# Patient Record
Sex: Male | Born: 1945 | Race: White | Hispanic: No | Marital: Married | Smoking: Never smoker
Health system: Southern US, Community
[De-identification: ages and names within clinical notes are randomized; demographics above are authoritative.]

## PROBLEM LIST (undated history)

## (undated) DIAGNOSIS — Z87442 Personal history of urinary calculi: Secondary | ICD-10-CM

## (undated) DIAGNOSIS — I219 Acute myocardial infarction, unspecified: Secondary | ICD-10-CM

## (undated) DIAGNOSIS — R760 Raised antibody titer: Secondary | ICD-10-CM

## (undated) DIAGNOSIS — I2510 Atherosclerotic heart disease of native coronary artery without angina pectoris: Secondary | ICD-10-CM

## (undated) DIAGNOSIS — E785 Hyperlipidemia, unspecified: Secondary | ICD-10-CM

## (undated) DIAGNOSIS — I255 Ischemic cardiomyopathy: Secondary | ICD-10-CM

## (undated) DIAGNOSIS — N400 Enlarged prostate without lower urinary tract symptoms: Secondary | ICD-10-CM

## (undated) DIAGNOSIS — K219 Gastro-esophageal reflux disease without esophagitis: Secondary | ICD-10-CM

## (undated) DIAGNOSIS — G4730 Sleep apnea, unspecified: Secondary | ICD-10-CM

## (undated) DIAGNOSIS — H269 Unspecified cataract: Secondary | ICD-10-CM

## (undated) DIAGNOSIS — M109 Gout, unspecified: Secondary | ICD-10-CM

## (undated) DIAGNOSIS — I10 Essential (primary) hypertension: Secondary | ICD-10-CM

## (undated) DIAGNOSIS — M1990 Unspecified osteoarthritis, unspecified site: Secondary | ICD-10-CM

## (undated) DIAGNOSIS — Z955 Presence of coronary angioplasty implant and graft: Secondary | ICD-10-CM

## (undated) DIAGNOSIS — I5022 Chronic systolic (congestive) heart failure: Secondary | ICD-10-CM

## (undated) DIAGNOSIS — I82409 Acute embolism and thrombosis of unspecified deep veins of unspecified lower extremity: Secondary | ICD-10-CM

## (undated) DIAGNOSIS — N210 Calculus in bladder: Secondary | ICD-10-CM

## (undated) DIAGNOSIS — I609 Nontraumatic subarachnoid hemorrhage, unspecified: Secondary | ICD-10-CM

## (undated) HISTORY — DX: Acute embolism and thrombosis of unspecified deep veins of unspecified lower extremity: I82.409

## (undated) HISTORY — DX: Hyperlipidemia, unspecified: E78.5

## (undated) HISTORY — DX: Benign prostatic hyperplasia without lower urinary tract symptoms: N40.0

## (undated) HISTORY — PX: WRIST SURGERY: SHX841

## (undated) HISTORY — DX: Gastro-esophageal reflux disease without esophagitis: K21.9

## (undated) HISTORY — DX: Nontraumatic subarachnoid hemorrhage, unspecified: I60.9

## (undated) HISTORY — DX: Atherosclerotic heart disease of native coronary artery without angina pectoris: I25.10

## (undated) HISTORY — PX: COLONOSCOPY: SHX174

## (undated) HISTORY — DX: Chronic systolic (congestive) heart failure: I50.22

## (undated) HISTORY — DX: Gout, unspecified: M10.9

## (undated) HISTORY — DX: Raised antibody titer: R76.0

---

## 1998-03-23 DIAGNOSIS — I219 Acute myocardial infarction, unspecified: Secondary | ICD-10-CM

## 1998-03-23 HISTORY — DX: Acute myocardial infarction, unspecified: I21.9

## 1998-05-09 ENCOUNTER — Encounter: Payer: Self-pay | Admitting: Internal Medicine

## 1998-05-09 ENCOUNTER — Inpatient Hospital Stay (HOSPITAL_COMMUNITY): Admission: EM | Admit: 1998-05-09 | Discharge: 1998-05-12 | Payer: Self-pay | Admitting: Cardiology

## 1998-05-15 ENCOUNTER — Ambulatory Visit (HOSPITAL_COMMUNITY): Admission: RE | Admit: 1998-05-15 | Discharge: 1998-05-15 | Payer: Self-pay | Admitting: Cardiology

## 1998-08-08 ENCOUNTER — Ambulatory Visit (HOSPITAL_COMMUNITY): Admission: RE | Admit: 1998-08-08 | Discharge: 1998-08-08 | Payer: Self-pay | Admitting: Cardiology

## 2001-06-09 ENCOUNTER — Ambulatory Visit (HOSPITAL_COMMUNITY): Admission: RE | Admit: 2001-06-09 | Discharge: 2001-06-09 | Payer: Self-pay | Admitting: Cardiology

## 2001-06-09 ENCOUNTER — Encounter: Payer: Self-pay | Admitting: Cardiology

## 2001-07-01 ENCOUNTER — Ambulatory Visit (HOSPITAL_COMMUNITY): Admission: RE | Admit: 2001-07-01 | Discharge: 2001-07-01 | Payer: Self-pay | Admitting: Cardiovascular Disease

## 2001-07-13 ENCOUNTER — Encounter: Admission: RE | Admit: 2001-07-13 | Discharge: 2001-07-13 | Payer: Self-pay | Admitting: Critical Care Medicine

## 2001-07-13 ENCOUNTER — Encounter: Payer: Self-pay | Admitting: Critical Care Medicine

## 2001-08-30 ENCOUNTER — Ambulatory Visit: Admission: RE | Admit: 2001-08-30 | Discharge: 2001-08-30 | Payer: Self-pay | Admitting: Critical Care Medicine

## 2003-09-21 ENCOUNTER — Emergency Department (HOSPITAL_COMMUNITY): Admission: EM | Admit: 2003-09-21 | Discharge: 2003-09-21 | Payer: Self-pay | Admitting: Emergency Medicine

## 2003-09-21 ENCOUNTER — Emergency Department (HOSPITAL_COMMUNITY): Admission: EM | Admit: 2003-09-21 | Discharge: 2003-09-21 | Payer: Self-pay | Admitting: *Deleted

## 2008-09-10 ENCOUNTER — Ambulatory Visit: Payer: Self-pay | Admitting: Diagnostic Radiology

## 2008-09-10 ENCOUNTER — Emergency Department (HOSPITAL_BASED_OUTPATIENT_CLINIC_OR_DEPARTMENT_OTHER): Admission: EM | Admit: 2008-09-10 | Discharge: 2008-09-10 | Payer: Self-pay | Admitting: Emergency Medicine

## 2009-04-09 LAB — LIPID PANEL
Cholesterol: 167 mg/dL (ref 0–200)
HDL: 40 mg/dL (ref 35–70)
LDL Cholesterol: 110 mg/dL
Triglycerides: 82 mg/dL (ref 40–160)

## 2009-04-09 LAB — TSH: TSH: 1.25 u[IU]/mL (ref <=5.90)

## 2009-12-09 ENCOUNTER — Ambulatory Visit: Payer: Self-pay | Admitting: Cardiology

## 2009-12-16 ENCOUNTER — Telehealth (INDEPENDENT_AMBULATORY_CARE_PROVIDER_SITE_OTHER): Payer: Self-pay | Admitting: *Deleted

## 2009-12-17 ENCOUNTER — Encounter: Payer: Self-pay | Admitting: Cardiology

## 2009-12-17 ENCOUNTER — Ambulatory Visit: Payer: Self-pay

## 2009-12-17 ENCOUNTER — Ambulatory Visit: Payer: Self-pay | Admitting: Cardiology

## 2009-12-17 ENCOUNTER — Encounter (HOSPITAL_COMMUNITY): Admission: RE | Admit: 2009-12-17 | Discharge: 2010-02-24 | Payer: Self-pay | Admitting: Cardiology

## 2009-12-20 ENCOUNTER — Ambulatory Visit: Payer: Self-pay | Admitting: Cardiology

## 2010-04-22 NOTE — Assessment & Plan Note (Signed)
Summary: Cardiology Nuclear Testing  Nuclear Med Background Indications for Stress Test: Evaluation for Ischemia   History: Heart Catheterization, Myocardial Infarction, Myocardial Perfusion Study, Stents  History Comments: 00 MI -AWMI and stent of LAD 07 MPS-Small basal ant. area of ischemia with EF-56%  Symptoms: Chest Pain    Nuclear Pre-Procedure Cardiac Risk Factors: Lipids Caffeine/Decaff Intake: none NPO After: 10:00 PM Lungs: clear IV 0.9% NS with Angio Cath: 22g     IV Site: R Hand IV Started by: Cathlyn Parsons, RN Chest Size (in) 46     Height (in): 72 Weight (lb): 250 BMI: 34.03  Nuclear Med Study 1 or 2 day study:  1 day     Stress Test Type:  Stress Reading MD:  Willa Rough, MD     Referring MD:  P.Jordon Resting Radionuclide:  Technetium 82m Tetrofosmin     Resting Radionuclide Dose:  11 mCi  Stress Radionuclide:  Technetium 75m Tetrofosmin     Stress Radionuclide Dose:  33 mCi   Stress Protocol Exercise Time (min):  5:00 min     Max HR:  162 bpm Max Systolic BP: 132 mm Hg     METS: 7.00 Rate Pressure Product:  04540    Stress Test Technologist:  Frederick Peers, EMT-P     Nuclear Technologist:  Domenic Polite, CNMT  Rest Procedure  Myocardial perfusion imaging was performed at rest 45 minutes following the intravenous administration of Technetium 29m Tetrofosmin.  Stress Procedure  The patient exercised for 5:00 min .  The patient stopped due to SOB   and denied any chest pain.  There were non specific ST-T wave changes/occ PVC post excersice that resolved..  Technetium 68m Tetrofosmin was injected at peak exercise and myocardial perfusion imaging was performed after a brief delay.  QPS Raw Data Images:  Patient motion noted; appropriate software correction applied. Stress Images:  There is decreased activity (mild) in the anterior wall and (moderate) in the septum and apex. Rest Images:  Similar to stress Subtraction (SDS):  Mild reversibility in  the apex. Transient Ischemic Dilatation:  .98  (Normal <1.22)  Lung/Heart Ratio:  .32  (Normal <0.45)  Quantitative Gated Spect Images QGS EDV:  141 ml QGS ESV:  85 ml QGS EF:  40 % QGS cine images:  Antero-septal-apical hypokinesis  Findings Abnormal      Overall Impression  Exercise Capacity: Poor exercise capacity. BP Response: Normal blood pressure response. Clinical Symptoms: Marked SOB ECG Impression: No significant ST segment change suggestive of ischemia. Overall Impression Comments: Patient had rapid increase in heart rate with stress. There was marked SOB. The O2sat. was 96%. There were no significant ST changes. The nuclear images are c/w old antero-septal-apical MI. There is mild peri-infarct ischemia.

## 2010-04-22 NOTE — Progress Notes (Signed)
Summary: nuc pre procedure  Phone Note Outgoing Call Call back at Home Phone [phone]   Call placed by: Cathlyn Parsons RN,  December 16, 2009 4:05 PM Call placed to: Patient Reason for Call: Confirm/change Appt Summary of Call: Reviewed information on Myoview Information Sheet (see scanned document for further details).  Spoke with patient.      Nuclear Med Background Indications for Stress Test: Evaluation for Ischemia   History: Heart Catheterization, Myocardial Infarction, Myocardial Perfusion Study, Stents  History Comments: 00 MI -AWMI and stent of LAD 07 MPS-Small basal ant. area of ischemia with EF-56%  Symptoms: Chest Pain    Nuclear Pre-Procedure Cardiac Risk Factors: Lipids

## 2010-04-30 ENCOUNTER — Encounter: Payer: Self-pay | Admitting: Cardiology

## 2010-04-30 DIAGNOSIS — K219 Gastro-esophageal reflux disease without esophagitis: Secondary | ICD-10-CM | POA: Insufficient documentation

## 2010-04-30 DIAGNOSIS — R760 Raised antibody titer: Secondary | ICD-10-CM | POA: Insufficient documentation

## 2010-04-30 DIAGNOSIS — E785 Hyperlipidemia, unspecified: Secondary | ICD-10-CM | POA: Insufficient documentation

## 2010-04-30 DIAGNOSIS — I2109 ST elevation (STEMI) myocardial infarction involving other coronary artery of anterior wall: Secondary | ICD-10-CM | POA: Insufficient documentation

## 2010-04-30 DIAGNOSIS — I82409 Acute embolism and thrombosis of unspecified deep veins of unspecified lower extremity: Secondary | ICD-10-CM | POA: Insufficient documentation

## 2010-04-30 DIAGNOSIS — I2510 Atherosclerotic heart disease of native coronary artery without angina pectoris: Secondary | ICD-10-CM | POA: Insufficient documentation

## 2010-06-18 ENCOUNTER — Ambulatory Visit: Payer: Self-pay | Admitting: Cardiology

## 2010-08-01 ENCOUNTER — Encounter: Payer: Self-pay | Admitting: Cardiology

## 2010-08-08 NOTE — Procedures (Signed)
[facility]  Patient:    TRENELL, CONCANNON Visit Number: [id number] MRN: [id number]          Service Type: OUT Location: CARD Attending Physician:  Caleb Popp Dictated by:   Oley Balm Sung Amabile, M.D. [facility] Proc. Date: 08/30/01 Admit Date:  08/30/2001 Discharge Date: 08/30/2001   CC:         Charlcie Cradle. Delford Field, M.D. [facility]  Cardiopulmonary Dept. [place]   Procedure Report  PROCEDURE:  Cardiopulmonary stress test.  DATE OF BIRTH:  [birth year]  FINDINGS:  This cardiopulmonary stress test was performed on a graded treadmill and testing was stopped due to dyspnea and heart rate goal. Effort was maximal. VO2 max was 2.00 liters per minute or 84% of predicted maximum indicating low normal exercise tolerance. At peak exercise, heart rate was 159 or 97% of predicted maximum indicating that cardiovascular limitation was reached. Oxygen pulse was low at 66% of predicted suggesting the possibility of decreased left ventricular function. Blood pressure response was normal. EKG tracings revealed no definite evidence of ischemia and no arrhythmias. Peak exercise minute ventilation was 75.5 liters per minute or 58% of predicted maximum indicating that ventilatory reserve remained. Gas exchange parameters revealed no abnormalities. Baseline spirometry revealed no obstruction.  SUMMARY:  Normal exercise tolerance by VO2 max. Impairment is mild to moderate when corrected for body weight. The limiting is cardiovascular. The limitation appears to be due to a combination of obesity and deconditioning versus possible primary cardiovascular disease as evidenced by decreased oxygen pulse. Dictated by:   Oley Balm Sung Amabile, M.D. [facility] Attending Physician:  Caleb Popp DD:  09/01/01 TD:  09/03/01 Job: [id number]

## 2010-08-08 NOTE — H&P (Signed)
[place]  Patient:    Kevin Hart, Kevin Hart Visit Number: [id number] MRN: [id number]          Service Type: CAT Location: [facility] 2899 12 Attending Physician:  [place], Peter Manning Dictated by:   Peter M. [place], M.D. Admit Date:  07/01/2001 Discharge Date: 07/01/2001   CC:         Dr. Kathrynn Speed, [facility]   History and Physical  HISTORY OF PRESENT ILLNESS: Mr. Lebow is a 65 year old white male, with no history of atherosclerotic coronary artery disease.  He is status post anterior myocardial infarction in February 2000.  He underwent emergent intervention at that time with angioplasty and stenting for fairly extensive dissection of the left anterior descending.  Subsequent follow-up cardiac catheterization in May 2000 showed continued patency of the stented segment. He was noted to have 90% stenosis of the ostium of a very small diagonal branch and otherwise no significant coronary disease.  Since then he has developed somewhat progressive dyspnea on exertion.  He underwent recent repeat evaluation with a stress Cardiolite study on June 03, 2001.  He was able to walk for five minutes and 30 seconds on Bruce protocol.  He was limited by dyspnea.  He had nondiagnostic ECG changes.  His Cardiolite images demonstrated evidence of anterior and apical ischemia, with ejection fraction 44%.  Given these findings and the absence of other etiology for his dyspnea, he is now admitted for cardiac catheterization.  PAST MEDICAL HISTORY:  1. DVT in January 2003.  He has been on anticoagulation with Coumadin since     then with the exception of holding his Coumadin for a week or two for     kidney stone extraction.  2. History of hematuria due to renal stone status post extraction in February     2003.  3. Anterior myocardial infarction in February 2000 with stenting of the LAD.  4. Gastroesophageal reflux disease.  5.  Hypercholesterolemia.  ALLERGIES: None known.  MEDICATIONS:  1. Coumadin one tablet five days per week.  2. Aspirin 81 mg q.d.  3. Toprol-XL 50 mg q.d.  4. Zocor 40 mg q.d.  5. Zetia 10 mg q.d.  FAMILY HISTORY: Father died of an aneurysm.  Otherwise family history is noncontributory.  SOCIAL HISTORY: The patient is currently unemployed.  Nonsmoker, nondrinker. He is divorced and has six children.  REVIEW OF SYSTEMS: The patient still has mild swelling in his left lower extremity.  He has had no pain.  No recurrent hematuria.  No orthopnea. Denies any chest pain.  All other Review Of Systems negative.  PHYSICAL EXAMINATION:  GENERAL: The patient is an overweight white male, in no apparent distress.  VITAL SIGNS: Weight 262 pounds.  Blood pressure 128/90, pulse 92 and regular, respirations normal.  HEENT: PERRL.  Conjunctivae clear.  Oropharynx clear.  NECK: Supple without JVD, adenopathy, thyromegaly, or bruits.  LUNGS: Clear.  CARDIAC: Regular rate and rhythm without murmurs, rubs, gallops, or clicks.  ABDOMEN: Soft, nontender.  Bowel sounds positive.  EXTREMITIES: Left lower extremity edema 1+.  No cords or tenderness.  NEUROLOGIC: Examination intact.  LABORATORY DATA: Chest x-ray shows mild COPD.  ECG shows normal sinus rhythm with old anteroseptal myocardial infarction.  IMPRESSION:  1. Dyspnea on exertion, consistent with angina equivalent.  2. Abnormal adenosine Cardiolite study.  3. Anterior myocardial infarction in February 2000, status post extensive     stenting of the left anterior descending.  4. Gastroesophageal reflux disease.  5. Deep vein thrombosis.  6. Status post extraction of renal stone.  7. Hypercholesterolemia.  PLAN: The patients Coumadin will be held four days prior to admission for cardiac catheterization.  Further therapy pending these results. Dictated by:   Peter M. [place], M.D. Attending Physician:  [place], Peter Manning DD:   06/24/01 TD:  06/25/01 Job: [id number]

## 2010-08-08 NOTE — Cardiovascular Report (Signed)
[place]  Patient:    Kevin Hart, Kevin Hart Visit Number: [id number] MRN: [id number]          Service Type: CAT Location: [facility] 2899 12 Attending Physician:  [place], Peter Manning Dictated by:   Peter M. [place], M.D. Proc. Date: 07/01/01 Admit Date:  07/01/2001 Discharge Date: 07/01/2001   CC:         Dr. Mardee Postin, [facility], [place], [place]   Cardiac Catheterization  INDICATIONS:  The patient is a 65 year old white male with previous anterior myocardial infarction in February 2000 with stenting of the LAD at that time. He has had persistent symptoms of dyspnea on exertion.  Stress Cardiolite study recently performed suggesting anterior ischemia.  PROCEDURES:  Left heart catheterization, coronary angiography.  CARDIOLOGIST:  Peter M. [place], M.D.  ACCESS:  Via the right femoral artery using the standard Seldinger technique.  EQUIPMENT:  6 French 4 cm right and left Judkins catheter, 6 French pigtail catheter, 6 French arterial sheath.  MEDICATIONS:  Versed 1 mg IV.  Local anesthesia with 1% Xylocaine.  CONTRAST:  Omnipaque 150 cc.  HEMODYNAMIC DATA:  Aortic pressure is 126/87 with a mean of 105.  The left ventricular pressure is 155 with an EDP of 22.  ANGIOGRAPHIC DATA:  Left coronary artery:  The left coronary artery arises normally and is large dominant vessel.  Left main:  The left main coronary artery is normal.  Left anterior descending:  The left anterior descending artery is a large vessel which wraps around the apex.  There is a long segment of stent in the mid LAD.  This segment still appears widely patent with less than 20% irregularities.  There are no focal areas of stenosis.  The remainder of the LAD is without significant disease.  There is a 90% stenosis in the takeoff of the second diagonal branch which arises out of the stented segment.  This is a relatively small vessel measuring approximately 2 mm in  diameter at most.  There is a large intermediate vessel which is normal.  The left circumflex coronary artery is a large dominant vessel and appears normal in all its branches.  The right coronary artery is a nondominant vessel and is normal.  LEFT VENTRICULAR ANGIOGRAPHY:  The left ventricular angiography is performed in the RAO and LAO cranial views demonstrates normal left ventricular size. There is mid anterior wall hypokinesia as well as akinesia of the septum. Overall, left ventricular function is mildly reduced with ejection fraction estimated at 45-50%.  FINAL INTERPRETATION: 1. Single vessel atherosclerotic coronary artery disease. The stented    segment in the mid LAD still appears widely patent.  There is involvement    of the ostium of the second diagonal branch arising out of the stent.  This    branch may be responsible for some of the ischemia noted on stress    testing but appears to be a small caliber vessel and the benefit of    dilatation of this vessel would be expected to be marginal. 2. Mild left ventricular dysfunction. Dictated by:   Peter M. [place], M.D. Attending Physician:  [place], Peter Manning DD:  07/05/01 TD:  07/05/01 Job: [id number]

## 2010-08-11 ENCOUNTER — Other Ambulatory Visit: Payer: Self-pay | Admitting: *Deleted

## 2010-08-11 MED ORDER — SIMVASTATIN 20 MG PO TABS: 20.0000 mg | ORAL_TABLET | Freq: Every day | ORAL | Status: DC

## 2010-08-11 NOTE — Telephone Encounter (Signed)
escribe medication per fax request  

## 2010-10-27 ENCOUNTER — Ambulatory Visit (INDEPENDENT_AMBULATORY_CARE_PROVIDER_SITE_OTHER): Payer: Medicare Other | Admitting: Cardiology

## 2010-10-27 ENCOUNTER — Encounter: Payer: Self-pay | Admitting: Cardiology

## 2010-10-27 DIAGNOSIS — R760 Raised antibody titer: Secondary | ICD-10-CM

## 2010-10-27 DIAGNOSIS — I2510 Atherosclerotic heart disease of native coronary artery without angina pectoris: Secondary | ICD-10-CM

## 2010-10-27 DIAGNOSIS — R894 Abnormal immunological findings in specimens from other organs, systems and tissues: Secondary | ICD-10-CM

## 2010-10-27 NOTE — Assessment & Plan Note (Signed)
His most recent stress Cardiolite study in September of 2011 showed a moderate defect in the anterior wall and septum and apex with minimal reversibility. Ejection fraction was 40%. He is currently asymptomatic. He is on appropriate medical therapy. We discussed the importance of lifestyle modification with weight loss and regular aerobic exercises. I recommended restriction and carbohydrate intake and reducing his portion sizes.

## 2010-10-27 NOTE — Patient Instructions (Signed)
You need to focus on weight loss by reducing your carbohydrate intake and reducing your portions.  You need to increase your activity to 30-40 minutes daily.  Continue your medications.  I will see you again in 6 months.

## 2010-10-27 NOTE — Progress Notes (Signed)
   Kevin Hart Date of Birth: 14-Nov-1945   History of Present Illness: Kevin Hart is seen today for followup. He reports that he has been doing well. We noted that he had gained 15 pounds since his last visit and he admits that he has been eating more and has not been getting any exercise. He denies any significant chest pain or shortness of breath. He has chronic swelling in his left leg which is unchanged. He denies any palpitations orthopnea. He remains on chronic Coumadin therapy and has had no bleeding difficulty.  Current Outpatient Prescriptions on File Prior to Visit  Medication Sig Dispense Refill  . ALLOPURINOL PO Take by mouth daily.        . furosemide (LASIX) 40 MG tablet Take 40 mg by mouth daily.        . pantoprazole (PROTONIX) 40 MG tablet Take 40 mg by mouth daily.        . simvastatin (ZOCOR) 20 MG tablet Take 1 tablet (20 mg total) by mouth at bedtime.  30 tablet  5  . Tamsulosin HCl (FLOMAX) 0.4 MG CAPS Take by mouth daily.        . WARFARIN SODIUM PO Take by mouth as directed.          No Known Allergies  Past Medical History  Diagnosis Date  . CAD (coronary artery disease)   . DVT (deep venous thrombosis)     RECURRENT  . Dyslipidemia   . GERD (gastroesophageal reflux disease)   . Anterior myocardial infarction   . Lupus anticoagulant positive     Past Surgical History  Procedure Date  . Kidney stone   . Coronary angioplasty     History  Smoking status  . Never Smoker   Smokeless tobacco  . Not on file    History  Alcohol Use: Not on file    Family History  Problem Relation Age of Onset  . Aneurysm Father     Review of Systems: As noted in history of present illness.  All other systems were reviewed and are negative.  Physical Exam: BP 128/86  Pulse 86  Wt 269 lb 9.6 oz (122.29 kg) He is an obese white male in no acute distress. He is normocephalic, atraumatic. Pupils are round and reactive to light and accommodation. Extraocular  movements are full. Oropharynx is clear. Neck is supple without JVD, adenopathy, thyromegaly, or bruits. Lungs are clear. Cardiac exam reveals a regular rate and rhythm without gallop, murmur, or click. Abdomen is obese, soft, nontender. He has 1+ left lower extremity edema. Pedal pulses are good. Skin is warm and dry. He is alert and oriented x3. Cranial nerves II through XII are intact. LABORATORY DATA:   Assessment / Plan:

## 2010-10-27 NOTE — Assessment & Plan Note (Signed)
With history of recurrent DVT. He is on chronic anticoagulation therapy.

## 2011-04-30 ENCOUNTER — Ambulatory Visit (INDEPENDENT_AMBULATORY_CARE_PROVIDER_SITE_OTHER): Payer: Medicare Other | Admitting: Cardiology

## 2011-04-30 ENCOUNTER — Encounter: Payer: Self-pay | Admitting: Cardiology

## 2011-04-30 VITALS — BP 132/88 | HR 92 | Ht 72.0 in | Wt 269.0 lb

## 2011-04-30 DIAGNOSIS — R760 Raised antibody titer: Secondary | ICD-10-CM

## 2011-04-30 DIAGNOSIS — R894 Abnormal immunological findings in specimens from other organs, systems and tissues: Secondary | ICD-10-CM

## 2011-04-30 DIAGNOSIS — E785 Hyperlipidemia, unspecified: Secondary | ICD-10-CM

## 2011-04-30 DIAGNOSIS — I2510 Atherosclerotic heart disease of native coronary artery without angina pectoris: Secondary | ICD-10-CM

## 2011-04-30 DIAGNOSIS — O2230 Deep phlebothrombosis in pregnancy, unspecified trimester: Secondary | ICD-10-CM

## 2011-04-30 NOTE — Assessment & Plan Note (Addendum)
He continues to do well from a cardiac standpoint. His last stress test in September of 2011 showed a moderate anterior wall defect that was probably fix. Ejection fraction was 40%. We will continue with his current diuretic therapy. He is not interested in ACE inhibitor therapy. He feels better off of the beta blocker. I have encouraged him with his plans to increase his aerobic activity. He needs to lose weight. I'll follow up again in 6 months.

## 2011-04-30 NOTE — Patient Instructions (Signed)
I encourage you getting more exercise.  Continue your current medication.  I will see you again in 6 months.

## 2011-04-30 NOTE — Assessment & Plan Note (Signed)
With history of DVT he is on chronic anticoagulant therapy.

## 2011-04-30 NOTE — Progress Notes (Signed)
   Kevin Hart Date of Birth: [birth year]   History of Present Illness: Kevin Hart is seen today for followup. [facility] Since stopping his beta blocker his erectile dysfunction is better. He has had no change in his heart rate. He's had only a slight twinge in his left pectoral region at times usually when lying down. He's had no other chest pain or shortness of breath. He's had no increase in edema.  Current Outpatient Prescriptions on File Prior to Visit  Medication Sig Dispense Refill  . ALLOPURINOL PO Take by mouth daily.        . furosemide (LASIX) 40 MG tablet Take 40 mg by mouth daily.        . pantoprazole (PROTONIX) 40 MG tablet Take 40 mg by mouth daily.        . simvastatin (ZOCOR) 20 MG tablet Take 1 tablet (20 mg total) by mouth at bedtime.  30 tablet  5  . Tamsulosin HCl (FLOMAX) 0.4 MG CAPS Take by mouth daily.        . WARFARIN SODIUM PO Take by mouth as directed.          No Known Allergies  Past Medical History  Diagnosis Date  . CAD (coronary artery disease)   . DVT (deep venous thrombosis)     RECURRENT  . Dyslipidemia   . GERD (gastroesophageal reflux disease)   . Anterior myocardial infarction   . Lupus anticoagulant positive     Past Surgical History  Procedure Date  . Kidney stone   . Coronary angioplasty     History  Smoking status  . Never Smoker   Smokeless tobacco  . Not on file    History  Alcohol Use: Not on file    Family History  Problem Relation Age of Onset  . Aneurysm Father     Review of Systems: As noted in history of present illness.  All other systems were reviewed and are negative.  Physical Exam: BP 132/88  Pulse 92  Ht 6' (1.829 m)  Wt 269 lb (122.018 kg)  BMI 36.48 kg/m2 He is an obese white male in no acute distress. He is normocephalic, atraumatic. Pupils are round and reactive to light and accommodation. Extraocular movements are full. Oropharynx is clear.  Neck is supple without JVD, adenopathy, thyromegaly, or bruits. Lungs are clear. Cardiac exam reveals a regular rate and rhythm without gallop, murmur, or click. Abdomen is obese, soft, nontender. He has 1+ left lower extremity edema. Pedal pulses are good. Skin is warm and dry. He is alert and oriented x3. Cranial nerves II through XII are intact. LABORATORY DATA: ECG today demonstrates normal sinus rhythm with old septal infarct. It is unchanged.  Assessment / Plan:

## 2013-03-23 DIAGNOSIS — I609 Nontraumatic subarachnoid hemorrhage, unspecified: Secondary | ICD-10-CM

## 2013-03-23 HISTORY — DX: Nontraumatic subarachnoid hemorrhage, unspecified: I60.9

## 2013-06-28 ENCOUNTER — Ambulatory Visit: Payer: Private Health Insurance - Indemnity | Attending: Orthopedic Surgery | Admitting: Rehabilitation

## 2013-06-28 DIAGNOSIS — M25639 Stiffness of unspecified wrist, not elsewhere classified: Secondary | ICD-10-CM | POA: Insufficient documentation

## 2013-06-28 DIAGNOSIS — IMO0001 Reserved for inherently not codable concepts without codable children: Secondary | ICD-10-CM | POA: Insufficient documentation

## 2013-07-05 ENCOUNTER — Ambulatory Visit: Payer: Private Health Insurance - Indemnity | Admitting: Rehabilitation

## 2014-07-04 ENCOUNTER — Inpatient Hospital Stay (HOSPITAL_BASED_OUTPATIENT_CLINIC_OR_DEPARTMENT_OTHER)
Admission: EM | Admit: 2014-07-04 | Discharge: 2014-07-07 | DRG: 246 | Disposition: A | Discharge status: Home / Self Care | Payer: Medicare Other | Attending: Cardiovascular Disease | Admitting: Cardiovascular Disease

## 2014-07-04 ENCOUNTER — Encounter (HOSPITAL_BASED_OUTPATIENT_CLINIC_OR_DEPARTMENT_OTHER): Payer: Self-pay | Admitting: *Deleted

## 2014-07-04 DIAGNOSIS — I25110 Atherosclerotic heart disease of native coronary artery with unstable angina pectoris: Secondary | ICD-10-CM | POA: Diagnosis present

## 2014-07-04 DIAGNOSIS — K219 Gastro-esophageal reflux disease without esophagitis: Secondary | ICD-10-CM | POA: Diagnosis present

## 2014-07-04 DIAGNOSIS — R079 Chest pain, unspecified: Secondary | ICD-10-CM | POA: Diagnosis not present

## 2014-07-04 DIAGNOSIS — I2510 Atherosclerotic heart disease of native coronary artery without angina pectoris: Secondary | ICD-10-CM | POA: Diagnosis present

## 2014-07-04 DIAGNOSIS — Z79899 Other long term (current) drug therapy: Secondary | ICD-10-CM

## 2014-07-04 DIAGNOSIS — D6862 Lupus anticoagulant syndrome: Secondary | ICD-10-CM | POA: Diagnosis present

## 2014-07-04 DIAGNOSIS — R760 Raised antibody titer: Secondary | ICD-10-CM | POA: Diagnosis present

## 2014-07-04 DIAGNOSIS — Z9189 Other specified personal risk factors, not elsewhere classified: Secondary | ICD-10-CM

## 2014-07-04 DIAGNOSIS — Z7982 Long term (current) use of aspirin: Secondary | ICD-10-CM

## 2014-07-04 DIAGNOSIS — Z95820 Peripheral vascular angioplasty status with implants and grafts: Secondary | ICD-10-CM

## 2014-07-04 DIAGNOSIS — Z7901 Long term (current) use of anticoagulants: Secondary | ICD-10-CM

## 2014-07-04 DIAGNOSIS — T82858A Stenosis of vascular prosthetic devices, implants and grafts, initial encounter: Secondary | ICD-10-CM | POA: Diagnosis not present

## 2014-07-04 DIAGNOSIS — I214 Non-ST elevation (NSTEMI) myocardial infarction: Secondary | ICD-10-CM | POA: Diagnosis present

## 2014-07-04 DIAGNOSIS — I252 Old myocardial infarction: Secondary | ICD-10-CM

## 2014-07-04 DIAGNOSIS — Z7902 Long term (current) use of antithrombotics/antiplatelets: Secondary | ICD-10-CM

## 2014-07-04 DIAGNOSIS — Z955 Presence of coronary angioplasty implant and graft: Secondary | ICD-10-CM

## 2014-07-04 DIAGNOSIS — I255 Ischemic cardiomyopathy: Secondary | ICD-10-CM | POA: Diagnosis present

## 2014-07-04 DIAGNOSIS — Z86718 Personal history of other venous thrombosis and embolism: Secondary | ICD-10-CM

## 2014-07-04 DIAGNOSIS — E785 Hyperlipidemia, unspecified: Secondary | ICD-10-CM | POA: Diagnosis present

## 2014-07-04 DIAGNOSIS — E876 Hypokalemia: Secondary | ICD-10-CM | POA: Diagnosis present

## 2014-07-04 HISTORY — DX: Ischemic cardiomyopathy: I25.5

## 2014-07-04 HISTORY — DX: Presence of coronary angioplasty implant and graft: Z95.5

## 2014-07-04 MED ORDER — NITROGLYCERIN 0.4 MG SL SUBL
0.4000 mg | SUBLINGUAL_TABLET | SUBLINGUAL | Status: AC | PRN
  Administered 2014-07-05 (×3): 0.4 mg via SUBLINGUAL
  Filled 2014-07-04: qty 1

## 2014-07-04 MED ORDER — ASPIRIN 81 MG PO CHEW
324.0000 mg | CHEWABLE_TABLET | Freq: Once | ORAL | Status: AC
  Administered 2014-07-05: 324 mg via ORAL
  Filled 2014-07-04: qty 4

## 2014-07-04 NOTE — ED Notes (Signed)
Pt c/o left side cp x 4 hrs ago denies SOB

## 2014-07-04 NOTE — ED Provider Notes (Addendum)
CSN: [id number]     Arrival date & time 07/04/14  2302 History  This chart was scribed for Evelina Bucy, MD by Steva Colder, ED Scribe. The patient was seen in room MH12/MH12 at 11:44 PM.     Chief Complaint  Patient presents with  . Chest Pain      Patient is a 69 y.o. male presenting with chest pain. The history is provided by the patient. No language interpreter was used.  Chest Pain Pain location:  L chest Pain quality: aching   Pain radiates to:  L arm Pain severity:  Moderate Onset quality:  Sudden Duration:  4 hours Timing:  Constant Progression:  Unchanged Chronicity:  New Context: at rest   Relieved by:  Nothing Worsened by:  Nothing tried Associated symptoms: nausea   Associated symptoms: no diaphoresis, no shortness of breath and not vomiting     HPI Comments: ISHAAN VILLAMAR is a 69 y.o. male with a medical hx of CAD, DVT, GERD, and anterior myocardial infarction who presents to the Emergency Department complaining of left sided CP onset 4 hours ago. Pt notes that the pain is steady and will intermittently pulse. Pt notes that the pain is non-radiating. Pt was laying on the bed when the CP occurred. He states that he is having associated symptoms of nausea. He denies SOB, vomiting, and any other symptoms. Pt had a heart attack 16 years ago and it does not feel similar. Pt has two stents placed. Pt cardiologist is Dr. Martinique. Pt has never had a blood clot in the lungs he has always had them in the legs.   PCP- Guadlupe Spanish, MD   Past Medical History  Diagnosis Date  . CAD (coronary artery disease)   . DVT (deep venous thrombosis)     RECURRENT  . Dyslipidemia   . GERD (gastroesophageal reflux disease)   . Anterior myocardial infarction   . Lupus anticoagulant positive    Past Surgical History  Procedure Laterality Date  . Kidney stone    . Coronary angioplasty     Family History  Problem Relation Age of Onset  . Aneurysm Father    History   Substance Use Topics  . Smoking status: Never Smoker   . Smokeless tobacco: Not on file  . Alcohol Use: No    Review of Systems  Constitutional: Negative for diaphoresis.  Respiratory: Negative for shortness of breath.   Cardiovascular: Positive for chest pain.  Gastrointestinal: Positive for nausea. Negative for vomiting.  All other systems reviewed and are negative.     Allergies  Review of patient's allergies indicates no known allergies.  Home Medications   Prior to Admission medications   Medication Sig Start Date End Date Taking? Authorizing Provider  ALLOPURINOL PO Take by mouth daily.      Historical Provider, MD  furosemide (LASIX) 40 MG tablet Take 40 mg by mouth daily.      Historical Provider, MD  pantoprazole (PROTONIX) 40 MG tablet Take 40 mg by mouth daily.      Historical Provider, MD  simvastatin (ZOCOR) 20 MG tablet Take 1 tablet (20 mg total) by mouth at bedtime. 08/11/10   Peter M Martinique, MD  Tamsulosin HCl (FLOMAX) 0.4 MG CAPS Take by mouth daily.      Historical Provider, MD  WARFARIN SODIUM PO Take by mouth as directed.      Historical Provider, MD   BP 162/102 mmHg  Pulse 119  Temp(Src) 97.9 F (36.6 C) (Oral)  Resp 16  Ht 6' (1.829 m)  Wt 252 lb (114.306 kg)  BMI 34.17 kg/m2 Physical Exam  Constitutional: He is oriented to person, place, and time. He appears well-developed and well-nourished. No distress.  HENT:  Head: Normocephalic and atraumatic.  Mouth/Throat: No oropharyngeal exudate.  Eyes: Pupils are equal, round, and reactive to light.  Neck: Normal range of motion. Neck supple.  Cardiovascular: Normal rate, regular rhythm and normal heart sounds.  Exam reveals no gallop and no friction rub.   No murmur heard. Pulmonary/Chest: Effort normal and breath sounds normal. No respiratory distress. He has no wheezes. He has no rales.  Abdominal: Soft. Bowel sounds are normal. He exhibits no distension and no mass. There is no tenderness. There  is no rebound and no guarding.  Musculoskeletal: Normal range of motion. He exhibits no edema or tenderness.  Neurological: He is alert and oriented to person, place, and time.  Skin: Skin is warm and dry.  Psychiatric: He has a normal mood and affect.  Nursing note and vitals reviewed.   ED Course  Procedures (including critical care time) DIAGNOSTIC STUDIES: Oxygen Saturation is 88% on , low by my interpretation.    COORDINATION OF CARE: 11:48 PM-Discussed treatment plan which includes CBC, EKG, ASA, NTG, troponin, with pt at bedside and pt agreed to plan.   Labs Review Labs Reviewed  CBC  BASIC METABOLIC PANEL  TROPONIN I    Imaging Review No results found.   EKG Interpretation   Date/Time:  Wednesday July 04 2014 23:09:37 EDT Ventricular Rate:  120 PR Interval:  184 QRS Duration: 82 QT Interval:  306 QTC Calculation: 432 R Axis:   -7 Text Interpretation:  Sinus tachycardia Anterior infarct , age  undetermined Abnormal ECG Inferior ST depression, new Confirmed by Mingo Amber   MD, Aylee Littrell ([id number]) on 07/04/2014 11:34:45 PM     CRITICAL CARE Performed by: Osvaldo Shipper   Total critical care time: 30 minutes  Critical care time was exclusive of separately billable procedures and treating other patients.  Critical care was necessary to treat or prevent imminent or life-threatening deterioration.  Critical care was time spent personally by me on the following activities: development of treatment plan with patient and/or surrogate as well as nursing, discussions with consultants, evaluation of patient's response to treatment, examination of patient, obtaining history from patient or surrogate, ordering and performing treatments and interventions, ordering and review of laboratory studies, ordering and review of radiographic studies, pulse oximetry and re-evaluation of patient's condition.  MDM   Final diagnoses:  Chest pain  NSTEMI (non-ST elevated myocardial  infarction)    69 year old male here with chest pain. Steady central pain radiated into the left arm. No shortness of breath, but is mildly nauseated. History of heart attack requiring 2 stent 16 years ago. Is on Coumadin for chronic DVTs. EKG with some inferior ST depression. No sign of STEMI. Initial troponin elevated. Heparin initiated. IV nitroglycerin initiated. Patient has an in STEMI. Cardiology consulted and will admit to critical care unit.  I personally performed the services described in this documentation, which was scribed in my presence. The recorded information has been reviewed and is accurate.     Evelina Bucy, MD 07/05/14 [id number]  Evelina Bucy, MD 07/05/14 [phone]

## 2014-07-04 NOTE — ED Notes (Signed)
MD at bedside. 

## 2014-07-05 ENCOUNTER — Inpatient Hospital Stay (HOSPITAL_BASED_OUTPATIENT_CLINIC_OR_DEPARTMENT_OTHER): Payer: Medicare Other

## 2014-07-05 ENCOUNTER — Emergency Department (HOSPITAL_BASED_OUTPATIENT_CLINIC_OR_DEPARTMENT_OTHER): Payer: Medicare Other

## 2014-07-05 DIAGNOSIS — E876 Hypokalemia: Secondary | ICD-10-CM | POA: Diagnosis present

## 2014-07-05 DIAGNOSIS — Z7982 Long term (current) use of aspirin: Secondary | ICD-10-CM | POA: Diagnosis not present

## 2014-07-05 DIAGNOSIS — Z7901 Long term (current) use of anticoagulants: Secondary | ICD-10-CM | POA: Diagnosis not present

## 2014-07-05 DIAGNOSIS — K219 Gastro-esophageal reflux disease without esophagitis: Secondary | ICD-10-CM | POA: Diagnosis present

## 2014-07-05 DIAGNOSIS — I255 Ischemic cardiomyopathy: Secondary | ICD-10-CM | POA: Diagnosis present

## 2014-07-05 DIAGNOSIS — E785 Hyperlipidemia, unspecified: Secondary | ICD-10-CM | POA: Diagnosis present

## 2014-07-05 DIAGNOSIS — T82858A Stenosis of vascular prosthetic devices, implants and grafts, initial encounter: Secondary | ICD-10-CM | POA: Diagnosis present

## 2014-07-05 DIAGNOSIS — Z86718 Personal history of other venous thrombosis and embolism: Secondary | ICD-10-CM | POA: Diagnosis not present

## 2014-07-05 DIAGNOSIS — I214 Non-ST elevation (NSTEMI) myocardial infarction: Secondary | ICD-10-CM

## 2014-07-05 DIAGNOSIS — R079 Chest pain, unspecified: Secondary | ICD-10-CM | POA: Diagnosis present

## 2014-07-05 DIAGNOSIS — D6862 Lupus anticoagulant syndrome: Secondary | ICD-10-CM | POA: Diagnosis present

## 2014-07-05 DIAGNOSIS — I25110 Atherosclerotic heart disease of native coronary artery with unstable angina pectoris: Secondary | ICD-10-CM | POA: Diagnosis not present

## 2014-07-05 DIAGNOSIS — I2109 ST elevation (STEMI) myocardial infarction involving other coronary artery of anterior wall: Secondary | ICD-10-CM

## 2014-07-05 DIAGNOSIS — I252 Old myocardial infarction: Secondary | ICD-10-CM | POA: Diagnosis not present

## 2014-07-05 DIAGNOSIS — I2510 Atherosclerotic heart disease of native coronary artery without angina pectoris: Secondary | ICD-10-CM | POA: Diagnosis present

## 2014-07-05 DIAGNOSIS — Z79899 Other long term (current) drug therapy: Secondary | ICD-10-CM | POA: Diagnosis not present

## 2014-07-05 DIAGNOSIS — Z7902 Long term (current) use of antithrombotics/antiplatelets: Secondary | ICD-10-CM | POA: Diagnosis not present

## 2014-07-05 LAB — TROPONIN I
Troponin I: 0.6 ng/mL (ref <0.031)
Troponin I: 9.75 ng/mL (ref <0.031)

## 2014-07-05 LAB — CBC
HCT: 43.4 % (ref 39.0–52.0)
Hemoglobin: 15.2 g/dL (ref 13.0–17.0)
MCH: 30.1 pg (ref 26.0–34.0)
MCHC: 35 g/dL (ref 30.0–36.0)
MCV: 85.9 fL (ref 78.0–100.0)
PLATELETS: 200 10*3/uL (ref 150–400)
RBC: 5.05 MIL/uL (ref 4.22–5.81)
RDW: 14.3 % (ref 11.5–15.5)
WBC: 8.8 10*3/uL (ref 4.0–10.5)

## 2014-07-05 LAB — BASIC METABOLIC PANEL
ANION GAP: 9 (ref 5–15)
Anion gap: 9 (ref 5–15)
BUN: 15 mg/dL (ref 6–23)
BUN: 11 mg/dL (ref 6–23)
CALCIUM: 9.1 mg/dL (ref 8.4–10.5)
CALCIUM: 9 mg/dL (ref 8.4–10.5)
CO2: 25 mmol/L (ref 19–32)
CO2: 25 mmol/L (ref 19–32)
CREATININE: 1.01 mg/dL (ref 0.50–1.35)
Chloride: 104 mmol/L (ref 96–112)
Chloride: 104 mmol/L (ref 96–112)
Creatinine, Ser: 1.19 mg/dL (ref 0.50–1.35)
GFR calc Af Amer: 70 mL/min — ABNORMAL LOW (ref >90)
GFR, EST AFRICAN AMERICAN: 85 mL/min — AB (ref >90)
GFR, EST NON AFRICAN AMERICAN: 60 mL/min — AB (ref >90)
GFR, EST NON AFRICAN AMERICAN: 73 mL/min — AB (ref >90)
GLUCOSE: 110 mg/dL — AB (ref 70–99)
Glucose, Bld: 144 mg/dL — ABNORMAL HIGH (ref 70–99)
POTASSIUM: 3.2 mmol/L — AB (ref 3.5–5.1)
Potassium: 4.1 mmol/L (ref 3.5–5.1)
SODIUM: 138 mmol/L (ref 135–145)
Sodium: 138 mmol/L (ref 135–145)

## 2014-07-05 LAB — HEPARIN LEVEL (UNFRACTIONATED)
Heparin Unfractionated: 0.19 IU/mL — ABNORMAL LOW (ref 0.30–0.70)
Heparin Unfractionated: 0.63 IU/mL (ref 0.30–0.70)

## 2014-07-05 LAB — PROTIME-INR
INR: 2.05 — ABNORMAL HIGH (ref 0.00–1.49)
Prothrombin Time: 23.1 seconds — ABNORMAL HIGH (ref 11.6–15.2)

## 2014-07-05 LAB — MAGNESIUM: MAGNESIUM: 1.9 mg/dL (ref 1.5–2.5)

## 2014-07-05 LAB — MRSA PCR SCREENING: MRSA BY PCR: NEGATIVE

## 2014-07-05 MED ORDER — HEPARIN (PORCINE) IN NACL 100-0.45 UNIT/ML-% IJ SOLN
1600.0000 [IU]/h | INTRAMUSCULAR | Status: DC
  Administered 2014-07-05: 1700 [IU]/h via INTRAVENOUS
  Administered 2014-07-05: 1400 [IU]/h via INTRAVENOUS
  Administered 2014-07-06: 1600 [IU]/h via INTRAVENOUS
  Filled 2014-07-05 (×5): qty 250

## 2014-07-05 MED ORDER — PERFLUTREN LIPID MICROSPHERE
INTRAVENOUS | Status: AC
  Filled 2014-07-05: qty 10

## 2014-07-05 MED ORDER — HEPARIN (PORCINE) IN NACL 100-0.45 UNIT/ML-% IJ SOLN
INTRAMUSCULAR | Status: AC
  Filled 2014-07-05: qty 250

## 2014-07-05 MED ORDER — SODIUM CHLORIDE 0.9 % IV SOLN
INTRAVENOUS | Status: DC
  Administered 2014-07-05: 21:00:00 via INTRAVENOUS

## 2014-07-05 MED ORDER — NITROGLYCERIN IN D5W 200-5 MCG/ML-% IV SOLN
10.0000 ug/min | INTRAVENOUS | Status: DC
  Administered 2014-07-05: 5 ug/min via INTRAVENOUS
  Filled 2014-07-05: qty 250

## 2014-07-05 MED ORDER — CARVEDILOL 3.125 MG PO TABS
6.2500 mg | ORAL_TABLET | Freq: Two times a day (BID) | ORAL | Status: DC
  Administered 2014-07-05 – 2014-07-06 (×4): 6.25 mg via ORAL
  Filled 2014-07-05: qty 2
  Filled 2014-07-05: qty 1
  Filled 2014-07-05: qty 2
  Filled 2014-07-05 (×4): qty 1

## 2014-07-05 MED ORDER — ACETAMINOPHEN 325 MG PO TABS: 650.0000 mg | ORAL_TABLET | ORAL | Status: DC | PRN

## 2014-07-05 MED ORDER — CLOPIDOGREL BISULFATE 75 MG PO TABS
75.0000 mg | ORAL_TABLET | Freq: Every day | ORAL | Status: DC
  Administered 2014-07-05 – 2014-07-07 (×3): 75 mg via ORAL
  Filled 2014-07-05 (×3): qty 1

## 2014-07-05 MED ORDER — ASPIRIN EC 81 MG PO TBEC
81.0000 mg | DELAYED_RELEASE_TABLET | Freq: Every day | ORAL | Status: DC
  Administered 2014-07-06 – 2014-07-07 (×2): 81 mg via ORAL
  Filled 2014-07-05 (×2): qty 1

## 2014-07-05 MED ORDER — MORPHINE SULFATE 4 MG/ML IJ SOLN
4.0000 mg | Freq: Once | INTRAMUSCULAR | Status: AC
  Administered 2014-07-05: 4 mg via INTRAVENOUS
  Filled 2014-07-05: qty 1

## 2014-07-05 MED ORDER — WHITE PETROLATUM GEL
Status: AC
  Administered 2014-07-05: 0.2
  Filled 2014-07-05: qty 1

## 2014-07-05 MED ORDER — ONDANSETRON HCL 4 MG/2ML IJ SOLN
4.0000 mg | Freq: Once | INTRAMUSCULAR | Status: AC
  Administered 2014-07-05: 4 mg via INTRAVENOUS
  Filled 2014-07-05: qty 2

## 2014-07-05 MED ORDER — HEPARIN BOLUS VIA INFUSION
3000.0000 [IU] | Freq: Once | INTRAVENOUS | Status: AC
  Administered 2014-07-05: 3000 [IU] via INTRAVENOUS
  Filled 2014-07-05: qty 3000

## 2014-07-05 MED ORDER — HEPARIN SODIUM (PORCINE) 5000 UNIT/ML IJ SOLN
4000.0000 [IU] | Freq: Once | INTRAMUSCULAR | Status: AC
  Administered 2014-07-05: 4000 [IU] via INTRAVENOUS

## 2014-07-05 MED ORDER — ONDANSETRON HCL 4 MG/2ML IJ SOLN: 4.0000 mg | Freq: Four times a day (QID) | INTRAMUSCULAR | Status: DC | PRN

## 2014-07-05 MED ORDER — PANTOPRAZOLE SODIUM 40 MG PO TBEC
40.0000 mg | DELAYED_RELEASE_TABLET | Freq: Every day | ORAL | Status: DC
  Administered 2014-07-05 – 2014-07-07 (×3): 40 mg via ORAL
  Filled 2014-07-05 (×3): qty 1

## 2014-07-05 MED ORDER — POTASSIUM CHLORIDE ER 10 MEQ PO TBCR
40.0000 meq | EXTENDED_RELEASE_TABLET | Freq: Once | ORAL | Status: AC
  Administered 2014-07-05: 40 meq via ORAL
  Filled 2014-07-05 (×2): qty 4

## 2014-07-05 MED ORDER — NITROGLYCERIN 0.4 MG SL SUBL: 0.4000 mg | SUBLINGUAL_TABLET | SUBLINGUAL | Status: DC | PRN

## 2014-07-05 MED ORDER — PERFLUTREN LIPID MICROSPHERE
1.0000 mL | INTRAVENOUS | Status: AC | PRN
  Filled 2014-07-05: qty 10

## 2014-07-05 MED ORDER — ATORVASTATIN CALCIUM 80 MG PO TABS
80.0000 mg | ORAL_TABLET | Freq: Every day | ORAL | Status: DC
  Administered 2014-07-05 – 2014-07-06 (×2): 80 mg via ORAL
  Filled 2014-07-05 (×4): qty 1

## 2014-07-05 MED ORDER — CLOPIDOGREL BISULFATE 300 MG PO TABS
300.0000 mg | ORAL_TABLET | Freq: Once | ORAL | Status: AC
  Administered 2014-07-05: 300 mg via ORAL
  Filled 2014-07-05: qty 1

## 2014-07-05 NOTE — Care Management Note (Signed)
    Page 1 of 1   07/05/2014     1:55:06 PM CARE MANAGEMENT NOTE 07/05/2014  Patient:  DAMICO, PARTIN   Account Number:  [id number]  Date Initiated:  07/05/2014  Documentation initiated by:  Elissa Hefty  Subjective/Objective Assessment:   adm w nstemi     Action/Plan:   lives w fam, pc dr Beryl Meager avind   Anticipated DC Date:     Anticipated DC Plan:  HOME/SELF CARE         Choice offered to / List presented to:             Status of service:   Medicare Important Message given?   (If response is "NO", the following Medicare IM given date fields will be blank) Date Medicare IM given:   Medicare IM given by:   Date Additional Medicare IM given:   Additional Medicare IM given by:    Discharge Disposition:    Per UR Regulation:  Reviewed for med. necessity/level of care/duration of stay  If discussed at [place] of Stay Meetings, dates discussed:    Comments:

## 2014-07-05 NOTE — Progress Notes (Addendum)
ANTICOAGULATION CONSULT NOTE - Initial Consult  Pharmacy Consult for heparin Indication: chest pain/ACS  No Known Allergies  Patient Measurements: Height: 6' (182.9 cm) Weight: 252 lb (114.306 kg) IBW/kg (Calculated) : 77.6 Heparin Dosing Weight: 105kg  Vital Signs: Temp: 97.9 F (36.6 C) (04/13 2310) Temp Source: Oral (04/13 2310) BP: 125/85 mmHg (04/14 0021) Pulse Rate: 101 (04/14 0021)  Labs:  Recent Labs  07/04/14 2315  HGB 15.2  HCT 43.4  PLT 200  LABPROT 23.1*  INR 2.05*  CREATININE 1.19  TROPONINI 0.60*    Estimated Creatinine Clearance: 75.4 mL/min (by C-G formula based on Cr of 1.19).   Medical History: Past Medical History  Diagnosis Date  . CAD (coronary artery disease)   . DVT (deep venous thrombosis)     RECURRENT  . Dyslipidemia   . GERD (gastroesophageal reflux disease)   . Anterior myocardial infarction   . 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  Goal of Therapy:  Heparin level 0.3-0.7 units/ml Monitor platelets by anticoagulation protocol: Yes   Plan:  Rec'd heparin 4000 units bolus per EDP; will begin heparin gtt at 1400 units/hr and monitor heparin levels and CBC.  Wynona Neat, PharmD, BCPS  07/05/2014,12:54 AM  _________________________________________________ Heparin level this AM subtherapeutic at 0.19 on 1400 units/hr. No issues with the infusion per RN. No bleeding noted. Hgb 15.2, plts 200. Of note, planning for cath as soon as INR <1.8.  Goal of Therapy: Heparin level 0.3-0.7 units/ml Monitor platelets by anticoagulation protocol: Yes  Plan: Bolus heparin 3000 units x 1 Increase heparin gtt 1700 units/hr 8hr HL at 1900 Daily HL/CBC INR in AM Monitor s/sx of bleeding  Thank you for allowing pharmacy to be part of this patient's care team  [place], Pharm.D Clinical  Pharmacy Resident Pager: [phone] 07/05/2014 .10:47 AM

## 2014-07-05 NOTE — Progress Notes (Signed)
MD advised of pt having short runs of Vtach (8-17 beats). MD to bedside for evaluation.

## 2014-07-05 NOTE — Progress Notes (Signed)
Echocardiogram 2D Echocardiogram has been performed.  Kevin Hart 07/05/2014, 4:09 PM

## 2014-07-05 NOTE — Progress Notes (Signed)
ANTICOAGULATION CONSULT NOTE - [place] for heparin Indication: chest pain/ACS  No Known Allergies  Patient Measurements: Height: 6' (182.9 cm) Weight: 252 lb (114.306 kg) IBW/kg (Calculated) : 77.6 Heparin Dosing Weight: 105kg  Vital Signs: Temp: 98.1 F (36.7 C) (04/14 1707) Temp Source: Oral (04/14 1707) BP: 134/74 mmHg (04/14 1707) Pulse Rate: 92 (04/14 1707)  Labs:  Recent Labs  07/04/14 2315 07/05/14 0728 07/05/14 1810  HGB 15.2  --   --   HCT 43.4  --   --   PLT 200  --   --   LABPROT 23.1*  --   --   INR 2.05*  --   --   HEPARINUNFRC  --  0.19* 0.63  CREATININE 1.19 1.01  --   TROPONINI 0.60* 9.75*  --     Estimated Creatinine Clearance: 88.8 mL/min (by C-G formula based on Cr of 1.01).   Medical History: Past Medical History  Diagnosis Date  . CAD (coronary artery disease)   . DVT (deep venous thrombosis)     RECURRENT  . Dyslipidemia   . GERD (gastroesophageal reflux disease)   . Anterior myocardial infarction   . 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  Heparin level this AM subtherapeutic at 0.19 on 1400 units/hr. No issues with the infusion per RN. No bleeding noted. Hgb 15.2, plts 200. Of note, planning for cath as soon as INR <1.8.  Repeat HL is therapeutic at 0.63 on heparin 1700 units/hr, but jumped significantly from 0.19. Nurse reports no issues with infusion or bleeding. Will slightly reduce heparin infusion and recheck level in the morning.  Goal of Therapy:  Heparin level 0.3-0.7 units/ml Monitor platelets by anticoagulation protocol: Yes   Plan:  Decrease heparin to 1600 units/hr Daily HL/CBC Monitor s/sx of bleeding  Andrey Cota. Diona Foley, PharmD Clinical Pharmacist Pager [phone]  07/05/2014,7:59 PM

## 2014-07-05 NOTE — ED Notes (Signed)
Pt reports continued cp 1/10. MD notified.  Order received for Morphine.

## 2014-07-05 NOTE — Progress Notes (Signed)
     SUBJECTIVE: No chest pain this am. No SOB  Tele: NSR  BP 103/68 mmHg  Pulse 81  Temp(Src) 97.7 F (36.5 C) (Oral)  Resp 17  Ht 6' (1.829 m)  Wt 252 lb (114.306 kg)  BMI 34.17 kg/m2  SpO2 99%  Intake/Output Summary (Last 24 hours) at 07/05/14 1028 Last data filed at 07/05/14 0700  Gross per 24 hour  Intake 102.77 ml  Output      0 ml  Net 102.77 ml    PHYSICAL EXAM General: Well developed, well nourished, in no acute distress. Alert and oriented x 3.  Psych:  Good affect, responds appropriately Neck: No JVD. No masses noted.  Lungs: Clear bilaterally with no wheezes or rhonci noted.  Heart: RRR with no murmurs noted. Abdomen: Bowel sounds are present. Soft, non-tender.  Extremities: No lower extremity edema.   LABS: Basic Metabolic Panel:  Recent Labs  07/04/14 2315 07/05/14 0728  NA 138 138  K 3.2* 4.1  CL 104 104  CO2 25 25  GLUCOSE 144* 110*  BUN 15 11  CREATININE 1.19 1.01  CALCIUM 9.1 9.0  MG  --  1.9   CBC:  Recent Labs  07/04/14 2315  WBC 8.8  HGB 15.2  HCT 43.4  MCV 85.9  PLT 200   Cardiac Enzymes:  Recent Labs  07/04/14 2315 07/05/14 0728  TROPONINI 0.60* 9.75*   Current Meds: . [START ON 07/06/2014] aspirin EC  81 mg Oral Daily  . atorvastatin  80 mg Oral q1800  . carvedilol  6.25 mg Oral BID WC  . clopidogrel  75 mg Oral Q breakfast  . pantoprazole  40 mg Oral Daily  . white petrolatum        ASSESSMENT AND PLAN:  1. CAD/NSTEMI: Admitted with chest pain c/w with unstable angina. Troponin is positive c/w NSTEMI. He is on ASA, Plavix, statin and beta blocker. He is on a heparin drip while awaiting cardiac cath. Coumadin is on hold. INR was over 2.0 on admission last night.   2. HLD: He is on a statin  3. History of DVT: He is on chronic coumadin therapy. INR was 2.0 last night. Hold coumadin for cardiac cath tomorrow if INR less than 1.8.   MCALHANY,CHRISTOPHER  4/14/201610:28 AM

## 2014-07-05 NOTE — ED Notes (Signed)
Attempted to call report to floor but the receiving nurse was not available.  She is supposed to call me back.

## 2014-07-05 NOTE — H&P (Addendum)
Admission History and Physical     Patient ID: KOOPER GODSHALL, MRN: [id number], DOB: 30-Oct-1945 69 y.o. Date of Encounter: 07/05/2014, 3:48 AM  Primary Physician: Guadlupe Spanish, MD Primary Cardiologist: Martinique  Chief Complaint:  Chest pain  History of Present Illness: OGDEN HANDLIN is a 69 y.o. male with a history of MI s/p PCI over 10 years ago, and history of multiple DVTs now on coumadin, who presents with chest pain. The chest pain started while he was lying in bed this evening.  Center of his chest, cramping/pressure pain.  Did not radiate, no SOB, not worse with inspiration.   He did feel his heart racing and his wife who is a Marine scientist checked his pulse and it was 120.  Also + nausea.  The pain persisted and [facility], where his troponin was elevated to 0.6.  ECG with upsloping ST depressions inferior and laterally.  He was started on heparin gtt (INR on coumadin was 2.0), nitro gtt, given morphine 4mg  IV, and loaded with 300mg  of plavix.    Currently chest pain free on arrival to [facility].  He states he may have had a bit more DOE over the past several weeks, but otherwise has been in his usual state of health.  Last seen by Dr. Martinique in clinic in 2013, last stress test was 2011.    Past Medical History  Diagnosis Date  . CAD (coronary artery disease)   . DVT (deep venous thrombosis)     RECURRENT  . Dyslipidemia   . GERD (gastroesophageal reflux disease)   . Anterior myocardial infarction   . Lupus anticoagulant positive      Past Surgical History  Procedure Laterality Date  . Kidney stone    . Coronary angioplasty        Current Facility-Administered Medications  Medication Dose Route Frequency Provider Last Rate Last Dose  . acetaminophen (TYLENOL) tablet 650 mg  650 mg Oral Q4H PRN Herbert Deaner, MD      . Derrill Memo ON 07/06/2014] aspirin EC tablet 81 mg  81 mg Oral Daily Herbert Deaner, MD      . atorvastatin (LIPITOR) tablet 80 mg  80 mg  Oral q1800 Herbert Deaner, MD      . carvedilol (COREG) tablet 6.25 mg  6.25 mg Oral BID WC Herbert Deaner, MD      . clopidogrel (PLAVIX) tablet 75 mg  75 mg Oral Q breakfast Herbert Deaner, MD      . heparin ADULT infusion 100 units/mL (25000 units/250 mL)  1,400 Units/hr Intravenous Continuous Laren Everts, RPH 14 mL/hr at 07/05/14 0058 1,400 Units/hr at 07/05/14 0058  . nitroGLYCERIN (NITROSTAT) SL tablet 0.4 mg  0.4 mg Sublingual Q5 Min x 3 PRN Herbert Deaner, MD      . nitroGLYCERIN 50 mg in dextrose 5 % 250 mL (0.2 mg/mL) infusion  10-200 mcg/min Intravenous Titrated Evelina Bucy, MD 3 mL/hr at 07/05/14 0115 10 mcg/min at 07/05/14 0115  . ondansetron (ZOFRAN) injection 4 mg  4 mg Intravenous Q6H PRN Herbert Deaner, MD      . pantoprazole (PROTONIX) EC tablet 40 mg  40 mg Oral Daily Herbert Deaner, MD      . potassium chloride (K-DUR) CR tablet 40 mEq  40 mEq Oral Once Herbert Deaner, MD          Allergies: No Known Allergies   Social History:  The  patient  reports that he has never smoked. He does not have any smokeless tobacco history on file. He reports that he does not drink alcohol or use illicit drugs.   Family History:  The patient's family history includes Aneurysm in his father.   ROS:  Please see the history of present illness.   All other systems reviewed and negative.   Vital Signs: Blood pressure 130/75, pulse 95, temperature 98.1 F (36.7 C), temperature source Oral, resp. rate 20, height 6' (1.829 m), weight 114.306 kg (252 lb), SpO2 96 %.  PHYSICAL EXAM: General:  Well nourished, well developed, in no acute distress HEENT: normal Lymph: no adenopathy Neck: no JVD Endocrine:  No thryomegaly Vascular: No carotid bruits; radial pulses 2+ bilaterally Cardiac:  normal S1, S2; +gallop (S4). No murmurs Lungs:  clear to auscultation bilaterally, no wheezing, rhonchi or rales Abd: soft, nontender, no hepatomegaly Ext: no edema Musculoskeletal:  No deformities, BUE  and BLE strength normal and equal Skin: warm and dry Neuro:  CNs 2-12 intact, no focal abnormalities noted Psych:  Normal affect   EKG:  Sinus tachycardia with anterior Q waves and infero-lateral ST depressions ~78mm  Labs:   Lab Results  Component Value Date   WBC 8.8 07/04/2014   HGB 15.2 07/04/2014   HCT 43.4 07/04/2014   MCV 85.9 07/04/2014   PLT 200 07/04/2014    Recent Labs Lab 07/04/14 2315  NA 138  K 3.2*  CL 104  CO2 25  BUN 15  CREATININE 1.19  CALCIUM 9.1  GLUCOSE 144*    Recent Labs  07/04/14 2315  TROPONINI 0.60*   Lab Results  Component Value Date   CHOL 167 04/09/2009   HDL 40 04/09/2009   LDLCALC 110 04/09/2009   TRIG 82 04/09/2009   No results found for: DDIMER  Radiology/Studies:  Dg Chest Portable 1 View  07/05/2014   CLINICAL DATA:  Mid chest pain since last night.  EXAM: PORTABLE CHEST - 1 VIEW  COMPARISON:  None.  FINDINGS: Borderline mild cardiomegaly. Prominence of central pulmonary vasculature, likely accentuated by technique. There is hyperinflation, probable chronic increased interstitial markings. No confluent airspace disease. No large pleural effusion or pneumothorax. Examination obtained in lordotic positioning.  IMPRESSION: 1. Borderline mild cardiomegaly. 2. Hyperinflation. Probable chronic increased interstitial markings.   Electronically Signed   By: Jeb Levering M.D.   On: 07/05/2014 02:56     ASSESSMENT AND PLAN:   1. NSTEMI - Given known prior CAD and concerning changes on ECG, recommend coronary angiography as first test - Continue heparin and hold coumadin, will recheck INR in the morning - Repeat troponin in the morning - Was plavix loaded at [place], continue plavix 75mg  daily, asa 81 daily - Change 20mg  simvastatin to 80mg  lipitor - Nitroglycerin gtt for chest pain - start coreg for BP/HR control (note BB previously stopped due to erectile dysfunction per Dr. Martinique note). - Hold home lasix given pending  cath  2. Hypokalemia - repleated, follow up in AM  - awaiting med reconciliation for remainder of home meds, but nothing appears urgent at this time.  Signed,  Stephani Police, MD 07/05/2014, 3:48 AM

## 2014-07-06 ENCOUNTER — Encounter (HOSPITAL_COMMUNITY)
Admission: EM | Disposition: A | Discharge status: Home / Self Care | Payer: Medicare Other | Source: Home / Self Care | Attending: Cardiovascular Disease

## 2014-07-06 ENCOUNTER — Encounter (HOSPITAL_COMMUNITY): Payer: Self-pay | Admitting: Nurse Practitioner

## 2014-07-06 ENCOUNTER — Telehealth: Payer: Self-pay | Admitting: *Deleted

## 2014-07-06 DIAGNOSIS — I2510 Atherosclerotic heart disease of native coronary artery without angina pectoris: Secondary | ICD-10-CM

## 2014-07-06 DIAGNOSIS — I255 Ischemic cardiomyopathy: Secondary | ICD-10-CM

## 2014-07-06 DIAGNOSIS — E785 Hyperlipidemia, unspecified: Secondary | ICD-10-CM

## 2014-07-06 DIAGNOSIS — Z86718 Personal history of other venous thrombosis and embolism: Secondary | ICD-10-CM | POA: Diagnosis present

## 2014-07-06 DIAGNOSIS — I25110 Atherosclerotic heart disease of native coronary artery with unstable angina pectoris: Secondary | ICD-10-CM | POA: Diagnosis present

## 2014-07-06 HISTORY — PX: PERCUTANEOUS CORONARY STENT INTERVENTION (PCI-S): SHX5485

## 2014-07-06 HISTORY — PX: LEFT HEART CATHETERIZATION WITH CORONARY ANGIOGRAM: SHX5451

## 2014-07-06 HISTORY — DX: Ischemic cardiomyopathy: I25.5

## 2014-07-06 LAB — HEPARIN LEVEL (UNFRACTIONATED): HEPARIN UNFRACTIONATED: 0.47 [IU]/mL (ref 0.30–0.70)

## 2014-07-06 LAB — CBC
HEMATOCRIT: 42.6 % (ref 39.0–52.0)
Hemoglobin: 14.5 g/dL (ref 13.0–17.0)
MCH: 29.7 pg (ref 26.0–34.0)
MCHC: 34 g/dL (ref 30.0–36.0)
MCV: 87.3 fL (ref 78.0–100.0)
PLATELETS: 182 10*3/uL (ref 150–400)
RBC: 4.88 MIL/uL (ref 4.22–5.81)
RDW: 14.6 % (ref 11.5–15.5)
WBC: 8.3 10*3/uL (ref 4.0–10.5)

## 2014-07-06 LAB — BASIC METABOLIC PANEL
Anion gap: 9 (ref 5–15)
BUN: 10 mg/dL (ref 6–23)
CO2: 24 mmol/L (ref 19–32)
Calcium: 9 mg/dL (ref 8.4–10.5)
Chloride: 106 mmol/L (ref 96–112)
Creatinine, Ser: 1.04 mg/dL (ref 0.50–1.35)
GFR calc Af Amer: 82 mL/min — ABNORMAL LOW (ref >90)
GFR, EST NON AFRICAN AMERICAN: 71 mL/min — AB (ref >90)
Glucose, Bld: 116 mg/dL — ABNORMAL HIGH (ref 70–99)
POTASSIUM: 3.9 mmol/L (ref 3.5–5.1)
SODIUM: 139 mmol/L (ref 135–145)

## 2014-07-06 LAB — PROTIME-INR
INR: 2.09 — ABNORMAL HIGH (ref 0.00–1.49)
PROTHROMBIN TIME: 23.7 s — AB (ref 11.6–15.2)

## 2014-07-06 LAB — GLUCOSE, CAPILLARY: Glucose-Capillary: 92 mg/dL (ref 70–99)

## 2014-07-06 SURGERY — LEFT HEART CATHETERIZATION WITH CORONARY ANGIOGRAM
Site: Hand | Laterality: Right

## 2014-07-06 MED ORDER — SODIUM CHLORIDE 0.9 % IV SOLN
1.0000 mL/kg/h | INTRAVENOUS | Status: AC
  Administered 2014-07-06: 19:00:00 1 mL/kg/h via INTRAVENOUS

## 2014-07-06 MED ORDER — CLOPIDOGREL BISULFATE 75 MG PO TABS: 75.0000 mg | ORAL_TABLET | Freq: Every day | ORAL | Status: DC

## 2014-07-06 MED ORDER — WARFARIN - PHARMACIST DOSING INPATIENT: Freq: Every day | Status: DC

## 2014-07-06 MED ORDER — VERAPAMIL HCL 2.5 MG/ML IV SOLN
INTRAVENOUS | Status: AC
  Filled 2014-07-06: qty 2

## 2014-07-06 MED ORDER — MIDAZOLAM HCL 2 MG/2ML IJ SOLN
INTRAMUSCULAR | Status: AC
  Filled 2014-07-06: qty 2

## 2014-07-06 MED ORDER — WARFARIN SODIUM 5 MG PO TABS
5.0000 mg | ORAL_TABLET | Freq: Once | ORAL | Status: AC
Start: 2014-07-06 — End: 2014-07-06
  Administered 2014-07-06: 18:00:00 5 mg via ORAL
  Filled 2014-07-06 (×2): qty 1

## 2014-07-06 MED ORDER — BIVALIRUDIN 250 MG IV SOLR
INTRAVENOUS | Status: AC
  Filled 2014-07-06: qty 250

## 2014-07-06 MED ORDER — ASPIRIN 81 MG PO CHEW: 81.0000 mg | CHEWABLE_TABLET | Freq: Every day | ORAL | Status: DC

## 2014-07-06 MED ORDER — FENTANYL CITRATE (PF) 100 MCG/2ML IJ SOLN
INTRAMUSCULAR | Status: AC
  Filled 2014-07-06: qty 2

## 2014-07-06 MED ORDER — LIDOCAINE HCL (PF) 1 % IJ SOLN
INTRAMUSCULAR | Status: AC
  Filled 2014-07-06: qty 30

## 2014-07-06 MED ORDER — WARFARIN SODIUM 5 MG PO TABS
5.0000 mg | ORAL_TABLET | Freq: Once | ORAL | Status: DC
  Filled 2014-07-06: qty 1

## 2014-07-06 MED ORDER — CLOPIDOGREL BISULFATE 300 MG PO TABS
ORAL_TABLET | ORAL | Status: AC
  Filled 2014-07-06: qty 1

## 2014-07-06 MED ORDER — LISINOPRIL 5 MG PO TABS
5.0000 mg | ORAL_TABLET | Freq: Every day | ORAL | Status: DC
  Administered 2014-07-06 – 2014-07-07 (×2): 5 mg via ORAL
  Filled 2014-07-06 (×2): qty 1

## 2014-07-06 MED ORDER — HEPARIN (PORCINE) IN NACL 2-0.9 UNIT/ML-% IJ SOLN
INTRAMUSCULAR | Status: AC
  Filled 2014-07-06: qty 1000

## 2014-07-06 MED ORDER — HEPARIN SODIUM (PORCINE) 1000 UNIT/ML IJ SOLN
INTRAMUSCULAR | Status: AC
  Filled 2014-07-06: qty 1

## 2014-07-06 MED ORDER — NITROGLYCERIN 1 MG/10 ML FOR IR/CATH LAB
INTRA_ARTERIAL | Status: AC
  Filled 2014-07-06: qty 10

## 2014-07-06 MED ORDER — SODIUM CHLORIDE 0.9 % IV SOLN
0.2500 mg/kg/h | INTRAVENOUS | Status: DC
  Filled 2014-07-06: qty 250

## 2014-07-06 MED ORDER — SPIRONOLACTONE 25 MG PO TABS
25.0000 mg | ORAL_TABLET | Freq: Every day | ORAL | Status: DC
  Administered 2014-07-06 – 2014-07-07 (×2): 25 mg via ORAL
  Filled 2014-07-06 (×2): qty 1

## 2014-07-06 NOTE — Telephone Encounter (Signed)
-----   Message from Kevin Hart sent at 07/06/2014  4:37 PM EDT ----- TOC needs phone call on Monday please. Pt will see [name] on Friday 4/22  Thanks Larena Glassman

## 2014-07-06 NOTE — Interval H&P Note (Signed)
History and Physical Interval Note:  07/06/2014 1:07 PM  Kevin Hart  has presented today for surgery, with the diagnosis of cp  The various methods of treatment have been discussed with the patient and family. After consideration of risks, benefits and other options for treatment, the patient has consented to  Procedure(s): LEFT HEART CATHETERIZATION WITH CORONARY ANGIOGRAM ([name]) as a surgical intervention .  The patient's history has been reviewed, patient examined, no change in status, stable for surgery.  I have reviewed the patient's chart and labs.  Questions were answered to the patient's satisfaction.    Cath Lab Visit (complete for each Cath Lab visit)  Clinical Evaluation Leading to the Procedure:   ACS: Yes.    Non-ACS:    Anginal Classification: CCS IV  Anti-ischemic medical therapy: Minimal Therapy (1 class of medications)  Non-Invasive Test Results: No non-invasive testing performed  Prior CABG: No previous CABG       Collier [facility] 07/06/2014 1:07 PM

## 2014-07-06 NOTE — Progress Notes (Signed)
     SUBJECTIVE: No chest pain this am. No SOB  BP 143/81 mmHg  Pulse 96  Temp(Src) 98.2 F (36.8 C) (Oral)  Resp 17  Ht 6' (1.829 m)  Wt 252 lb (114.306 kg)  BMI 34.17 kg/m2  SpO2 94%  Intake/Output Summary (Last 24 hours) at 07/06/14 0824 Last data filed at 07/06/14 0800  Gross per 24 hour  Intake 545.41 ml  Output    750 ml  Net -204.59 ml    PHYSICAL EXAM General: Well developed, well nourished, in no acute distress. Alert and oriented x 3.  Psych:  Good affect, responds appropriately Neck: No JVD. No masses noted.  Lungs: Clear bilaterally with no wheezes or rhonci noted.  Heart: RRR with no murmurs noted. Abdomen: Bowel sounds are present. Soft, non-tender.  Extremities: No lower extremity edema.   LABS: Basic Metabolic Panel:  Recent Labs  07/05/14 0728 07/06/14 0255  NA 138 139  K 4.1 3.9  CL 104 106  CO2 25 24  GLUCOSE 110* 116*  BUN 11 10  CREATININE 1.01 1.04  CALCIUM 9.0 9.0  MG 1.9  --    CBC:  Recent Labs  07/04/14 2315 07/06/14 0255  WBC 8.8 8.3  HGB 15.2 14.5  HCT 43.4 42.6  MCV 85.9 87.3  PLT 200 182   Cardiac Enzymes:  Recent Labs  07/04/14 2315 07/05/14 0728  TROPONINI 0.60* 9.75*    Current Meds: . aspirin EC  81 mg Oral Daily  . atorvastatin  80 mg Oral q1800  . carvedilol  6.25 mg Oral BID WC  . clopidogrel  75 mg Oral Q breakfast  . pantoprazole  40 mg Oral Daily   Echo 07/05/14: Left ventricle: LVEF is approximately 25 to 30% with akinesis of the distal 1/2 of ventricle. The cavity size was normal. Wall thickness was normal. - Aortic valve: There was trivial regurgitation. - Pulmonary arteries: PA peak pressure: 38 mm Hg (S).  ASSESSMENT AND PLAN:  1. CAD/NSTEMI: Admitted with chest pain c/w with unstable angina. Troponin is positive c/w NSTEMI. No chest pain this am. He is on ASA, Plavix, statin and beta blocker. He is on a heparin drip while awaiting cardiac cath. Coumadin is on hold. INR is 2.09 today  but will plan cardiac cath this afternoon with Dr. Martinique  from the radial artery approach.   2. HLD: He is on a statin  3. History of DVT: He is on chronic coumadin therapy. INR was 2.09 this am. Hold coumadin for cardiac cath this afternoon.    4. Ischemic cardiomyopathy: LVEF=25-30% on echo 07/05/14. He has not had cardiac follow up since February 2013 and no recent LVEF on the chart. He reports "damage to my heart after my first heart attack" but unclear if the LV dysfunction noted on echo 07/05/14 is new or chronic. Will add Lisinopril 5 mg po Qdaily.   Kevin Hart,Kevin Hart  4/15/20168:24 AM

## 2014-07-06 NOTE — CV Procedure (Signed)
Cardiac Catheterization Procedure Note  Name: Kevin Hart MRN: [id number] DOB: 28-Dec-1945  Procedure: Left Heart Cath, Selective Coronary Angiography, LV angiography, PTCA and stenting of the mid LAD  Indication: 69 yo WM with history of remote anterior MI with stenting of the LAD presents with a NSTEMI.  Procedural Details:  The right wrist was prepped, draped, and anesthetized with 1% lidocaine. Using the modified Seldinger technique, a 6 French slender sheath was introduced into the right radial artery. 3 mg of verapamil was administered through the sheath, weight-based unfractionated heparin was administered intravenously. Standard Judkins catheters were used for selective coronary angiography and left ventriculography. Catheter exchanges were performed over an exchange length guidewire.  PROCEDURAL FINDINGS Hemodynamics: AO 128/76 mean 100 mm Hg LV 139/18 mm Hg   Coronary angiography: Coronary dominance: left  Left mainstem: Normal  Left anterior descending (LAD): There is diffuse 30% narrowing of the proximal LAD up to 30%. There is a long stented segment in the mid LAD with a focal 90% stenosis at the takeoff of the second diagonal.   Left circumflex (LCx): Large dominant vessel. Normal   Right coronary artery (RCA): small nondominant vessel. Normal.   Left ventriculography: Left ventricular systolic function is abnormal. The LV is dilated with akinesis of the anterior wall, apex, and distal inferior wall.  LVEF is estimated at 25-30%, there is no significant mitral regurgitation   PCI Note:  Following the diagnostic procedure, the decision was made to proceed with PCI of the LAD.  Weight-based bivalirudin was given for anticoagulation. Once a therapeutic ACT was achieved, a 6 [place] XBLAD 3.5 guide catheter was inserted. A prowater coronary guidewire was passed into the second diagonal.  A second prowater coronary guidewire was used to cross the lesion in the LAD.  The  lesion was predilated with a 3.0 x 6 mm Angiosculpt balloon. There was some transient no-reflow that resolved with IC Ntg and verapamil.  The lesion was then stented with a 3.0 x 20 mm Promus stent. Using a trapped balloon technique the diagonal was dilated with a 2.25 mm balloon behind the stent. The diagonal balloon and wire were then withdrawn and the diagonal was rewired thru the stent struts.   The stent was postdilated with a 3.25 mm noncompliant balloon to 16 atm.  We then performed a final kissing balloon angioplasty with the 3.25 mm balloon in the LAD and 2.25 mm balloon in the diagonal. Following PCI, there was 0% residual stenosis and TIMI-3 flow. The diagonal branch was widely patent with TIMI 3 flow.Final angiography confirmed an excellent result. The patient tolerated the procedure well. There were no immediate procedural complications. A TR band was used for radial hemostasis. The patient was transferred to the post catheterization recovery area for further monitoring.  PCI Data: Vessel - LAD/Segment - mid Percent Stenosis (pre)  90% TIMI-flow 3 Stent 3.0 x 20 mm Promus post dilated to 3.25 mm at 16 atm Percent Stenosis (post) 0% TIMI-flow (post) 3  Final Conclusions:   1. Severe single vessel obstructive CAD with focal in-stent disease in the mid LAD. 2. Severe LV dysfunction 3. Successful stenting of the mid LAD with a DES.    Recommendations:  DAPT with ASA and Plavix for one month then discontinue ASA and continue Plavix for at least one year. Resume Coumadin this pm. Optimize medical therapy for CHF with ACEi, Coreg, and aldactone. Repeat Echo in 3 months. If EF is still low consider ICD.   Theron Arista  [place], [place] 07/06/2014, 2:30 PM

## 2014-07-06 NOTE — Progress Notes (Signed)
ANTICOAGULATION CONSULT NOTE - Follow-Up Consult  Pharmacy Consult for heparin Indication: chest pain/ACS  No Known Allergies  Patient Measurements: Height: 6' (182.9 cm) Weight: 252 lb (114.306 kg) IBW/kg (Calculated) : 77.6 Heparin Dosing Weight: 105kg  Vital Signs: Temp: 98.2 F (36.8 C) (04/15 0808) Temp Source: Oral (04/15 0808) BP: 143/81 mmHg (04/15 0808) Pulse Rate: 96 (04/15 0808)  Labs:  Recent Labs  07/04/14 2315 07/05/14 0728 07/05/14 1810 07/06/14 0255  HGB 15.2  --   --  14.5  HCT 43.4  --   --  42.6  PLT 200  --   --  182  LABPROT 23.1*  --   --  23.7*  INR 2.05*  --   --  2.09*  HEPARINUNFRC  --  0.19* 0.63 0.47  CREATININE 1.19 1.01  --  1.04  TROPONINI 0.60* 9.75*  --   --     Estimated Creatinine Clearance: 86.3 mL/min (by C-G formula based on Cr of 1.04).   Medical History: Past Medical History  Diagnosis Date  . CAD (coronary artery disease)   . DVT (deep venous thrombosis)     RECURRENT  . Dyslipidemia   . GERD (gastroesophageal reflux disease)   . Anterior myocardial infarction   . 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  Heparin level this AM remains therapeutic at 0.47. Patient on 1600 units/hr. No issues with the infusion per RN. No bleeding noted. Hgb stable 14.5, plt stable 182.  Of note, planning for cath, probably today @1300  (MD ok to do with INR~2).  Goal of Therapy:  Heparin level 0.3-0.7 units/ml Monitor platelets by anticoagulation protocol: Yes   Plan:  Continue heparin at 1600 units/hr Daily HL/CBC Monitor s/sx of bleeding F/u heparin restart post-cath today  Elicia Lamp, PharmD Clinical Pharmacist - Resident Pager [phone] 07/06/2014 9:05 AM

## 2014-07-06 NOTE — Progress Notes (Signed)
TR BAND REMOVAL  LOCATION:    right radial  DEFLATED PER PROTOCOL:    Yes.    TIME BAND OFF / DRESSING APPLIED:    1900   SITE UPON ARRIVAL:    Level 0  SITE AFTER BAND REMOVAL:    Level 0  REVERSE ALLEN'S TEST:     positive  CIRCULATION SENSATION AND MOVEMENT:    Within Normal Limits   Yes.    COMMENTS:   Tolerated procedure well 

## 2014-07-06 NOTE — Progress Notes (Signed)
ANTICOAGULATION CONSULT NOTE - Follow-Up Consult  Pharmacy Consult for Coumadin Indication: DVT  No Known Allergies  Patient Measurements: Height: 6' (182.9 cm) Weight: 252 lb (114.306 kg) IBW/kg (Calculated) : 77.6 Heparin Dosing Weight: 105kg  Vital Signs: Temp: 98 F (36.7 C) (04/15 1200) Temp Source: Oral (04/15 1200) BP: 131/81 mmHg (04/15 1200) Pulse Rate: 89 (04/15 1200)  Labs:  Recent Labs  07/04/14 2315 07/05/14 0728 07/05/14 1810 07/06/14 0255  HGB 15.2  --   --  14.5  HCT 43.4  --   --  42.6  PLT 200  --   --  182  LABPROT 23.1*  --   --  23.7*  INR 2.05*  --   --  2.09*  HEPARINUNFRC  --  0.19* 0.63 0.47  CREATININE 1.19 1.01  --  1.04  TROPONINI 0.60* 9.75*  --   --     Estimated Creatinine Clearance: 86.3 mL/min (by C-G formula based on Cr of 1.04).   Medical History: Past Medical History  Diagnosis Date  . CAD (coronary artery disease)   . DVT (deep venous thrombosis)     RECURRENT  . Dyslipidemia   . GERD (gastroesophageal reflux disease)   . Anterior myocardial infarction   . 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   Now s/p cath - resuming coumadin  Goal of Therapy:   Monitor platelets by anticoagulation protocol: Yes  INR = 2 to 3   Plan:  Coumadin 7.5 mg po x 1 Daily INR  Thank you. Anette Guarneri, PharmD [id number]   07/06/2014 3:47 PM

## 2014-07-06 NOTE — H&P (View-Only) (Signed)
     SUBJECTIVE: No chest pain this am. No SOB  BP 143/81 mmHg  Pulse 96  Temp(Src) 98.2 F (36.8 C) (Oral)  Resp 17  Ht 6' (1.829 m)  Wt 252 lb (114.306 kg)  BMI 34.17 kg/m2  SpO2 94%  Intake/Output Summary (Last 24 hours) at 07/06/14 0824 Last data filed at 07/06/14 0800  Gross per 24 hour  Intake 545.41 ml  Output    750 ml  Net -204.59 ml    PHYSICAL EXAM General: Well developed, well nourished, in no acute distress. Alert and oriented x 3.  Psych:  Good affect, responds appropriately Neck: No JVD. No masses noted.  Lungs: Clear bilaterally with no wheezes or rhonci noted.  Heart: RRR with no murmurs noted. Abdomen: Bowel sounds are present. Soft, non-tender.  Extremities: No lower extremity edema.   LABS: Basic Metabolic Panel:  Recent Labs  07/05/14 0728 07/06/14 0255  NA 138 139  K 4.1 3.9  CL 104 106  CO2 25 24  GLUCOSE 110* 116*  BUN 11 10  CREATININE 1.01 1.04  CALCIUM 9.0 9.0  MG 1.9  --    CBC:  Recent Labs  07/04/14 2315 07/06/14 0255  WBC 8.8 8.3  HGB 15.2 14.5  HCT 43.4 42.6  MCV 85.9 87.3  PLT 200 182   Cardiac Enzymes:  Recent Labs  07/04/14 2315 07/05/14 0728  TROPONINI 0.60* 9.75*    Current Meds: . aspirin EC  81 mg Oral Daily  . atorvastatin  80 mg Oral q1800  . carvedilol  6.25 mg Oral BID WC  . clopidogrel  75 mg Oral Q breakfast  . pantoprazole  40 mg Oral Daily   Echo 07/05/14: Left ventricle: LVEF is approximately 25 to 30% with akinesis of the distal 1/2 of ventricle. The cavity size was normal. Wall thickness was normal. - Aortic valve: There was trivial regurgitation. - Pulmonary arteries: PA peak pressure: 38 mm Hg (S).  ASSESSMENT AND PLAN:  1. CAD/NSTEMI: Admitted with chest pain c/w with unstable angina. Troponin is positive c/w NSTEMI. No chest pain this am. He is on ASA, Plavix, statin and beta blocker. He is on a heparin drip while awaiting cardiac cath. Coumadin is on hold. INR is 2.09 today  but will plan cardiac cath this afternoon with Dr. [place]  from the radial artery approach.   2. HLD: He is on a statin  3. History of DVT: He is on chronic coumadin therapy. INR was 2.09 this am. Hold coumadin for cardiac cath this afternoon.    4. Ischemic cardiomyopathy: LVEF=25-30% on echo 07/05/14. He has not had cardiac follow up since February 2013 and no recent LVEF on the chart. He reports "damage to my heart after my first heart attack" but unclear if the LV dysfunction noted on echo 07/05/14 is new or chronic. Will add Lisinopril 5 mg po Qdaily.   Kevin Hart  4/15/20168:24 AM

## 2014-07-07 ENCOUNTER — Encounter (HOSPITAL_COMMUNITY): Payer: Self-pay | Admitting: Cardiology

## 2014-07-07 DIAGNOSIS — I25110 Atherosclerotic heart disease of native coronary artery with unstable angina pectoris: Secondary | ICD-10-CM

## 2014-07-07 DIAGNOSIS — I255 Ischemic cardiomyopathy: Secondary | ICD-10-CM

## 2014-07-07 DIAGNOSIS — Z9189 Other specified personal risk factors, not elsewhere classified: Secondary | ICD-10-CM

## 2014-07-07 DIAGNOSIS — Z955 Presence of coronary angioplasty implant and graft: Secondary | ICD-10-CM

## 2014-07-07 DIAGNOSIS — Z86718 Personal history of other venous thrombosis and embolism: Secondary | ICD-10-CM

## 2014-07-07 HISTORY — DX: Presence of coronary angioplasty implant and graft: Z95.5

## 2014-07-07 LAB — GLUCOSE, CAPILLARY: GLUCOSE-CAPILLARY: 105 mg/dL — AB (ref 70–99)

## 2014-07-07 LAB — CBC
HCT: 42.9 % (ref 39.0–52.0)
Hemoglobin: 14.6 g/dL (ref 13.0–17.0)
MCH: 29.8 pg (ref 26.0–34.0)
MCHC: 34 g/dL (ref 30.0–36.0)
MCV: 87.6 fL (ref 78.0–100.0)
Platelets: 171 10*3/uL (ref 150–400)
RBC: 4.9 MIL/uL (ref 4.22–5.81)
RDW: 14.5 % (ref 11.5–15.5)
WBC: 8.2 10*3/uL (ref 4.0–10.5)

## 2014-07-07 LAB — BASIC METABOLIC PANEL
ANION GAP: 8 (ref 5–15)
BUN: 11 mg/dL (ref 6–23)
CALCIUM: 9.2 mg/dL (ref 8.4–10.5)
CO2: 24 mmol/L (ref 19–32)
CREATININE: 1.05 mg/dL (ref 0.50–1.35)
Chloride: 107 mmol/L (ref 96–112)
GFR calc Af Amer: 81 mL/min — ABNORMAL LOW (ref >90)
GFR calc non Af Amer: 70 mL/min — ABNORMAL LOW (ref >90)
Glucose, Bld: 93 mg/dL (ref 70–99)
Potassium: 4 mmol/L (ref 3.5–5.1)
Sodium: 139 mmol/L (ref 135–145)

## 2014-07-07 LAB — POCT ACTIVATED CLOTTING TIME: Activated Clotting Time: 558 seconds

## 2014-07-07 LAB — PROTIME-INR
INR: 1.72 — AB (ref 0.00–1.49)
PROTHROMBIN TIME: 20.3 s — AB (ref 11.6–15.2)

## 2014-07-07 MED ORDER — CLOPIDOGREL BISULFATE 75 MG PO TABS: 75.0000 mg | ORAL_TABLET | Freq: Every day | ORAL | Status: DC

## 2014-07-07 MED ORDER — SPIRONOLACTONE 25 MG PO TABS: 25.0000 mg | ORAL_TABLET | Freq: Every day | ORAL | Status: DC

## 2014-07-07 MED ORDER — CARVEDILOL 12.5 MG PO TABS: 12.5000 mg | ORAL_TABLET | Freq: Two times a day (BID) | ORAL | Status: DC

## 2014-07-07 MED ORDER — PANTOPRAZOLE SODIUM 40 MG PO TBEC: 40.0000 mg | DELAYED_RELEASE_TABLET | Freq: Every day | ORAL | Status: DC

## 2014-07-07 MED ORDER — ATORVASTATIN CALCIUM 80 MG PO TABS: 80.0000 mg | ORAL_TABLET | Freq: Every day | ORAL | Status: DC

## 2014-07-07 MED ORDER — NITROGLYCERIN 0.4 MG SL SUBL: 0.4000 mg | SUBLINGUAL_TABLET | SUBLINGUAL | Status: DC | PRN

## 2014-07-07 MED ORDER — ASPIRIN 81 MG PO TBEC: 81.0000 mg | DELAYED_RELEASE_TABLET | Freq: Every day | ORAL | Status: DC

## 2014-07-07 MED ORDER — LISINOPRIL 5 MG PO TABS: 5.0000 mg | ORAL_TABLET | Freq: Every day | ORAL | Status: DC

## 2014-07-07 MED ORDER — CARVEDILOL 12.5 MG PO TABS
12.5000 mg | ORAL_TABLET | Freq: Two times a day (BID) | ORAL | Status: DC
  Administered 2014-07-07: 10:00:00 12.5 mg via ORAL
  Filled 2014-07-07: qty 1

## 2014-07-07 NOTE — Progress Notes (Signed)
Subjective: No complaints  Objective: Vital signs in last 24 hours: Temp:  [98 F (36.7 C)-98.3 F (36.8 C)] 98.2 F (36.8 C) (04/16 0442) Pulse Rate:  [84-96] 84 (04/16 0442) Resp:  [14-25] 21 (04/16 0442) BP: (114-144)/(66-91) 125/73 mmHg (04/16 0442) SpO2:  [94 %-98 %] 98 % (04/16 0442) Weight:  [252 lb 3.3 oz (114.4 kg)] 252 lb 3.3 oz (114.4 kg) (04/16 0442) Weight change:  Last BM Date: 07/07/14 Intake/Output from previous day: +167 04/15 0701 - 04/16 0700 In: 1441.3 [P.O.:480; I.V.:961.3] Out: 1300 [Urine:1300] Intake/Output this shift:    PE: General:Pleasant affect, NAD Skin:Warm and dry, brisk capillary refill HEENT:normocephalic, sclera clear, mucus membranes moist Neck:supple, no JVD, no bruits  Heart:S1S2 RRR without murmur, gallup, rub or click Lungs:clear without rales, rhonchi, or wheezes OAC:ZYSA, non tender, + BS, do not palpate liver spleen or masses Ext:no lower ext edema, 2+ pedal pulses, 2+ radial pulses Neuro:alert and oriented, MAE, follows commands, + facial symmetry   Lab Results:  Recent Labs  07/06/14 0255 07/07/14 0425  WBC 8.3 8.2  HGB 14.5 14.6  HCT 42.6 42.9  PLT 182 171   BMET  Recent Labs  07/06/14 0255 07/07/14 0425  NA 139 139  K 3.9 4.0  CL 106 107  CO2 24 24  GLUCOSE 116* 93  BUN 10 11  CREATININE 1.04 1.05  CALCIUM 9.0 9.2    Recent Labs  07/04/14 2315 07/05/14 0728  TROPONINI 0.60* 9.75*    Lab Results  Component Value Date   CHOL 167 04/09/2009   HDL 40 04/09/2009   LDLCALC 110 04/09/2009   TRIG 82 04/09/2009   No results found for: HGBA1C   Lab Results  Component Value Date   TSH 1.25 04/09/2009     Studies/Results: Cardiac cath: Final Conclusions:  1. Severe single vessel obstructive CAD with focal in-stent disease in the mid LAD. 2. Severe LV dysfunction 3. Successful stenting of the mid LAD with a DES.   Recommendations:  DAPT with ASA and Plavix for one month then  discontinue ASA and continue Plavix for at least one year. Resume Coumadin this pm. Optimize medical therapy for CHF with ACEi, Coreg, and aldactone. Repeat Echo in 3 months. If EF is still low consider ICD.    Medications: I have reviewed the patient's current medications. Scheduled Meds: . aspirin EC  81 mg Oral Daily  . atorvastatin  80 mg Oral q1800  . carvedilol  6.25 mg Oral BID WC  . clopidogrel  75 mg Oral Q breakfast  . lisinopril  5 mg Oral Daily  . pantoprazole  40 mg Oral Daily  . spironolactone  25 mg Oral Daily  . Warfarin - Pharmacist Dosing Inpatient   Does not apply q1800   Continuous Infusions: . sodium chloride 10 mL/hr at 07/05/14 2037   PRN Meds:.acetaminophen, nitroGLYCERIN, ondansetron (ZOFRAN) IV  Assessment/Plan: 1. CAD/NSTEMI: Admitted with chest pain c/w with unstable angina. Troponin is positive c/w NSTEMI. No chest pain. Marland Kitchen He is on ASA, Plavix, statin and beta blocker.  2. HLD: He is on a statin  3. History of DVT: He is on chronic coumadin therapy. INR was 2.09 yesterday, today 1.72  4. Ischemic cardiomyopathy: LVEF=25-30% on echo 07/05/14. He has not had cardiac follow up since February 2013 and no recent LVEF on the chart. He reports "damage to my heart after my first heart attack" but unclear if the LV dysfunction noted on echo  07/05/14 is new or chronic. Will add Lisinopril 5 mg po Qdaily.    LOS: 2 days   Time spent with pt. :15 minutes. [facility]  Nurse Practitioner Certified Pager [phone] or after 5pm and on weekends call [phone] 07/07/2014, 7:44 AM   Patient seen and examined and history reviewed. Agree with above findings and plan. Doing well today. Had short term memory loss post procedure due to sedation. No chest pain or SOB. Telemetry shows PVCs with couplets and triplets. Will arrange for [facility] at DC. Repeat Echo at three months and if EF still less than 35% will need ICD. Will increase Coreg today. Aldactone started. Will need  close follow up of BMET. INR 1.72 today. Only off Coumadin for 2 days. Will resume and follow. I would not recommend bridging heparin since he is on triple therapy. Plan to stop ASA at one month. Continue Plavix at least one year and possibly indefinitely. Continue PPI for prophylaxis.   Peter Martinique, [place] 07/07/2014 8:02 AM

## 2014-07-07 NOTE — Progress Notes (Signed)
CARDIAC REHAB PHASE I   PRE:  Rate/Rhythm: Sinus 95  BP:    Sitting: 124/72     SaO2: 95% Room Air  MODE:  Ambulation: 350 ft   POST:  Rate/Rhythem: 115  BP:    Sitting: 140/84     SaO2: 95% Room Air  0800-0900 Patient ambulated in the hallway independently. Mr Mortensen did appear somewhat short of breath while walking. Patient denied shortness of breath. Heart rate in the 120's sinus tach with during walk. Cecilie Kicks FNP-C notified.Patient given MI book. Low sodium heart health diet information reviewed with the patient and his wife. Reviewed exercise instructions, end points  of exercise, when to call 911. Patient is interested in phase 2 cardiac rehab. Permission granted  By patient to contact upon discharge from the hospital.  Venetia Maxon, Christa See RN BSN

## 2014-07-07 NOTE — Discharge Instructions (Signed)
Wear your life vest as instructed.  Call [facility] at [phone] if any bleeding, swelling or drainage at cath site.  May shower, no tub baths for 48 hours for groin sticks. No lifting over 5 pounds for 5 days.  No Driving for 4 days  Bring all medications with you to appointment  Heart Healthy Diet  Weigh daily Call [phone] if weight climbs more than 3 pounds in a day or 5 pounds in a week. No salt to very little salt in your diet.  No more than 2000 mg in a day. Call if increased shortness of breath or increased swelling.  We added new meds to help your heart pump stronger- lisinopril, and coreg.  We stopped your kdur because some of the new meds hold on to potassium so for now you do not need extra, we will check next week.  Have your coumadin level checked on Tuesday at your PCP along with with BMP and Hepatic panel.  FAX to Dr. Martin[place]  Fax#[phone]-   Do not stop Plavix and ASA they are to keep stent open.  Stopping could cause a heart attack.

## 2014-07-07 NOTE — Discharge Summary (Signed)
Physician Discharge Summary       Patient ID: Kevin Hart MRN: [id number] DOB/AGE: Jul 24, 1945 69 y.o.  Admit date: 07/04/2014 Discharge date: 07/07/2014 Primary Cardiologist:Dr. Martinique   Discharge Diagnoses:  Principal Problem:   NSTEMI (non-ST elevated myocardial infarction) Active Problems:   Cardiomyopathy, ischemic   S/P drug eluting coronary stent placement, mLAD 07/06/14, Promus   At risk for sudden cardiac death, EF 25-30% with PVCs, with lifevest   CAD (coronary artery disease)   Dyslipidemia   Lupus anticoagulant positive   Coronary artery disease involving native coronary artery of native heart with unstable angina pectoris   History of DVT (deep vein thrombosis), on coumadin   Discharged Condition: good  Procedures: cardiac cath and PCI to mLAD 07/06/14 by Dr. Martinique.  Hospital Course: Kevin Hart is a 69 y.o. 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 The chest pain started while he was lying in bed. Center of his chest, cramping/pressure pain. Did not radiate, no SOB, not worse with inspiration. He did feel his heart racing and his wife who is a Marine scientist checked his pulse and it was 120. Also + nausea. The pain persisted and [facility] went [facility], where his troponin was elevated to 0.6. ECG with upsloping ST depressions inferior and laterally. He was started on heparin gtt (INR on coumadin was 2.0), nitro gtt, given morphine 48m IV, and loaded with 3026mof plavix. [facility]  He was monitored in CCU until INR was sub therapeutic for cath.  EF on echo was 25-30%.    Pt had cardiac cath on 07/06/14 and found severe single vessel obstructive CAD with focal in-stent disease in the mid LAD, severe LV dysfunction, successful stenting of the mid LAD with a DES.  DAPT with ASA and Plavix for one month then discontinue  ASA and continue Plavix for at least one year.   Coumadin was resumed the evening of cath prior to discharge INR  1.72 - we will have coumadin checked next week as well.  Medical therapy for CHF with ACEi, Coreg, and aldactone. Repeat Echo in 3 months- will be ordered in the office after medication titrated for decreased EF.  If EF is still low consider ICD.  With LVEF is estimated at 25-30%, and freq PVCs on tele.  Pt's coreg was increased and Life Vest was ordered.   Life vest is placed prior to discharge.  Pt understands the need for life vest.   We also added spironolactone to meds pt will need BMP next week.  Order placed.  Along with hepatic panel and coumadin check- will be done at PCP's office .  Dr. ArHolley Raring Consults: cardiology  Significant Diagnostic Studies:  BMET    Component Value Date/Time   NA 139 07/07/2014 0425   K 4.0 07/07/2014 0425   CL 107 07/07/2014 0425   CO2 24 07/07/2014 0425   GLUCOSE 93 07/07/2014 0425   BUN 11 07/07/2014 0425   CREATININE 1.05 07/07/2014 0425   CALCIUM 9.2 07/07/2014 0425   GFRNONAA 70* 07/07/2014 0425   GFRAA 81* 07/07/2014 0425    CBC    Component Value Date/Time   WBC 8.2 07/07/2014 0425   RBC 4.90 07/07/2014 0425   HGB 14.6 07/07/2014 0425   HCT 42.9 07/07/2014 0425   PLT 171 07/07/2014 0425   MCV 87.6  07/07/2014 0425   MCH 29.8 07/07/2014 0425   MCHC 34.0 07/07/2014 0425   RDW 14.5 07/07/2014 0425    pk troponin 9.75  PORTABLE CHEST - 1 VIEW COMPARISON: None. FINDINGS: Borderline mild cardiomegaly. Prominence of central pulmonary vasculature, likely accentuated by technique. There is hyperinflation, probable chronic increased interstitial markings. No confluent airspace disease. No large pleural effusion or pneumothorax. Examination obtained in lordotic positioning. IMPRESSION: 1. Borderline mild cardiomegaly. 2. Hyperinflation. Probable chronic increased interstitial markings.   EKG: Normal sinus  rhythm Cannot rule out Inferior infarct , age undetermined Anteroseptal infarct , age undetermined T wave abnormality, consider lateral ischemia Abnormal ECG  ECHO: Study Conclusions - Left ventricle: LVEF is approixmately 25 to 30% with akinesis of the distal 1/2 of ventricle. The cavity size was normal. Wall thickness was normal. - Aortic valve: There was trivial regurgitation. - Pulmonary arteries: PA peak pressure: 38 mm Hg (S).   CARDIAC CATH AND PCI: PROCEDURAL FINDINGS Hemodynamics: AO 128/76 mean 100 mm Hg LV 139/18 mm Hg  Coronary angiography: Coronary dominance: left  Left mainstem: Normal  Left anterior descending (LAD): There is diffuse 30% narrowing of the proximal LAD up to 30%. There is a long stented segment in the mid LAD with a focal 90% stenosis at the takeoff of the second diagonal.   Left circumflex (LCx): Large dominant vessel. Normal   Right coronary artery (RCA): small nondominant vessel. Normal.   Left ventriculography: Left ventricular systolic function is abnormal. The LV is dilated with akinesis of the anterior wall, apex, and distal inferior wall. LVEF is estimated at 25-30%, there is no significant mitral regurgitation   PCI Note: Following the diagnostic procedure, the decision was made to proceed with PCI of the LAD. Weight-based bivalirudin was given for anticoagulation. Once a therapeutic ACT was achieved, a 6 [place] XBLAD 3.5 guide catheter was inserted. A prowater coronary guidewire was passed into the second diagonal. A second prowater coronary guidewire was used to cross the lesion in the LAD. The lesion was predilated with a 3.0 x 6 mm Angiosculpt balloon. There was some transient no-reflow that resolved with IC Ntg and verapamil. The lesion was then stented with a 3.0 x 20 mm Promus stent. Using a trapped balloon technique the diagonal was dilated with a 2.25 mm balloon behind the stent. The diagonal balloon and wire  were then withdrawn and the diagonal was rewired thru the stent struts. The stent was postdilated with a 3.25 mm noncompliant balloon to 16 atm. We then performed a final kissing balloon angioplasty with the 3.25 mm balloon in the LAD and 2.25 mm balloon in the diagonal. Following PCI, there was 0% residual stenosis and TIMI-3 flow. The diagonal branch was widely patent with TIMI 3 flow.Final angiography confirmed an excellent result. The patient tolerated the procedure well. There were no immediate procedural complications. A TR band was used for radial hemostasis. The patient was transferred to the post catheterization recovery area for further monitoring.  PCI Data: Vessel - LAD/Segment - mid Percent Stenosis (pre) 90% TIMI-flow 3 Stent 3.0 x 20 mm Promus post dilated to 3.25 mm at 16 atm Percent Stenosis (post) 0% TIMI-flow (post) 3  Final Conclusions:  1. Severe single vessel obstructive CAD with focal in-stent disease in the mid LAD. 2. Severe LV dysfunction 3. Successful stenting of the mid LAD with a DES.    Discharge Exam: Blood pressure 140/84, pulse 105, temperature 98.5 F (36.9 C), temperature source Oral, resp. rate 20, height 6' (  1.829 m), weight 252 lb 3.3 oz (114.4 kg), SpO2 97 %.    Disposition: Home      Discharge Instructions    Amb Referral to Cardiac Rehabilitation    Complete by:  As directed             Medication List    STOP taking these medications        potassium chloride 10 MEQ CR capsule  Commonly known as:  MICRO-K     simvastatin 20 MG tablet  Commonly known as:  ZOCOR  Replaced by:  atorvastatin 80 MG tablet      TAKE these medications        allopurinol 100 MG tablet  Commonly known as:  ZYLOPRIM  Take 100 mg by mouth daily at 12 noon.  Notes to Patient:  Gout      aspirin 81 MG EC tablet  Take 1 tablet (81 mg total) by mouth daily.  Notes to Patient:  Prevent clotting in artery and heart attack     atorvastatin 80 MG  tablet  Commonly known as:  LIPITOR  Take 1 tablet (80 mg total) by mouth daily at 6 PM.  Notes to Patient:  Cholesterol      carvedilol 12.5 MG tablet  Commonly known as:  COREG  Take 1 tablet (12.5 mg total) by mouth 2 (two) times daily with a meal.  Notes to Patient:  Decreases work of the heart Decreases heart rate, blood pressure and contraction of heart muscle     clopidogrel 75 MG tablet  Commonly known as:  PLAVIX  Take 1 tablet (75 mg total) by mouth daily with breakfast.  Notes to Patient:  Prevent clotting in artery and heart attack     FLOMAX 0.4 MG Caps capsule  Generic drug:  tamsulosin  Take 0.4 mg by mouth daily.  Notes to Patient:  Prostate     furosemide 40 MG tablet  Commonly known as:  LASIX  Take 40 mg by mouth daily.  Notes to Patient:  Fluid pill     lisinopril 5 MG tablet  Commonly known as:  PRINIVIL,ZESTRIL  Take 1 tablet (5 mg total) by mouth daily.  Notes to Patient:  Blood pressure     MULTI-VITAMINS Tabs  Take 1 tablet by mouth daily at 12 noon.     nitroGLYCERIN 0.4 MG SL tablet  Commonly known as:  NITROSTAT  Place 1 tablet (0.4 mg total) under the tongue every 5 (five) minutes x 3 doses as needed for chest pain.     pantoprazole 40 MG tablet  Commonly known as:  PROTONIX  Take 40 mg by mouth daily.  Notes to Patient:  Take 2 times a day instead of Nexium  Nexium can cause plavix not to work and if taken with plavix could result in clotting in artery and heart attack     spironolactone 25 MG tablet  Commonly known as:  ALDACTONE  Take 1 tablet (25 mg total) by mouth daily.  Notes to Patient:  Blood pressure and weak fluid pill     TYLENOL PO  Take 1 tablet by mouth daily at 12 noon.     Vitamin D (Ergocalciferol) 50000 UNITS Caps capsule  Commonly known as:  DRISDOL  Take 1 capsule by mouth once a week. On the weekends     warfarin 5 MG tablet  Commonly known as:  COUMADIN  Take 5 mg by mouth daily at 12 noon. Does not take  on  Thursdays  Notes to Patient:  Blood thinner       Follow-up Information    Follow up with Rosaria Ferries, PA-C On 07/13/2014.   Specialty:  Cardiology   Why:  8:00 AM   Contact information:   [place] [address] [phone]       Follow up with Guadlupe Spanish, MD On 07/10/2014.   Specialty:  Internal Medicine   Why:  have lab work, PT/INR, BMP, and Hepatic done and ask them to send to Dr. Doug Sou office   Contact information:   [place] [address] [phone]        Discharge Instructions: Wear your life vest as instructed.  Call [facility] at [phone] if any bleeding, swelling or drainage at cath site.  May shower, no tub baths for 48 hours for groin sticks. No lifting over 5 pounds for 5 days.  No Driving for 4 days  Bring all medications with you to appointment  Heart Healthy Diet  Weigh daily Call [phone] if weight climbs more than 3 pounds in a day or 5 pounds in a week. No salt to very little salt in your diet.  No more than 2000 mg in a day. Call if increased shortness of breath or increased swelling.  We added new meds to help your heart pump stronger- lisinopril, and coreg.  We stopped your kdur because some of the new meds hold on to potassium so for now you do not need extra, we will check next week.  Have your coumadin level checked on Tuesday at your PCP along with with BMP and Hepatic panel.   Do not stop Plavix and ASA they are to keep stent open.  Stopping could cause a heart attack.     Signed: Isaiah Serge Nurse Practitioner-Certified [facility]: [facility]ART[facility] 07/07/2014, 11:04 AM  Time spent on discharge : >30 minutes.

## 2014-07-09 MED FILL — Sodium Chloride IV Soln 0.9%: INTRAVENOUS | Qty: 50 | Status: AC

## 2014-07-10 LAB — PROTIME-INR: INR: 1.6 — AB (ref 0.9–1.1)

## 2014-07-12 NOTE — Telephone Encounter (Signed)
Patient called received a message from Cecilie Kicks NP she wanted lab results from 07/10/14.Stated he did have lab work done at PCP 07/10/14.Dr.Arvind's office called 07/10/14 lab work requested.

## 2014-07-13 ENCOUNTER — Encounter: Payer: Self-pay | Admitting: Physician Assistant

## 2014-07-13 ENCOUNTER — Other Ambulatory Visit: Payer: Self-pay | Admitting: *Deleted

## 2014-07-13 ENCOUNTER — Ambulatory Visit (INDEPENDENT_AMBULATORY_CARE_PROVIDER_SITE_OTHER): Payer: Medicare Other | Admitting: Physician Assistant

## 2014-07-13 VITALS — BP 90/60 | HR 73 | Ht 72.0 in | Wt 250.0 lb

## 2014-07-13 DIAGNOSIS — E785 Hyperlipidemia, unspecified: Secondary | ICD-10-CM

## 2014-07-13 DIAGNOSIS — I2109 ST elevation (STEMI) myocardial infarction involving other coronary artery of anterior wall: Secondary | ICD-10-CM | POA: Diagnosis not present

## 2014-07-13 DIAGNOSIS — I25110 Atherosclerotic heart disease of native coronary artery with unstable angina pectoris: Secondary | ICD-10-CM

## 2014-07-13 NOTE — Patient Instructions (Signed)
Medication Instructions:  None  Labwork: None  Testing/Procedures: None  Follow-Up: As ordered with Dr. Martinique in May.   Any Other Special Instructions Will Be Listed Below (If Applicable).

## 2014-07-13 NOTE — Progress Notes (Signed)
Cardiology Office Note   Date:  07/13/2014   ID:  Gurnoor, Ursua November 04, 1945, MRN [id number]  PCP:  Guadlupe Spanish, MD  Cardiologist:  Dr Martinique  Rhonda Barrett, PA-C   Chief Complaint  Patient presents with  . Shortness of Breath    fatigue    History of Present Illness: Kevin Hart is a 69 y.o. 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  He presents for TCM follow-up. No chest pain or palpitations. His LifeVest has not fired. Has lost 10 lbs, decreasing salt and eating healthier. He has had dyspnea on exertion since going home, but feels that he is getting stronger. He is increasing his ambulation gradually per cardiac rehabilitation guidelines. He denies lower extremity edema, orthopnea or PND.   He has some problems with his memory and word-finding, but these have improved a little since discharge. He has some bruising and some of the areas where the life vest is tight, but these are starting to improve. He has no other bleeding issues. He is tolerating aspirin, Plavix, and Coumadin well.   Past Medical History  Diagnosis Date  . CAD (coronary artery disease)     a. prior LAD stenting;  b. 06/2014 NSTEMI/PCI: LM nl, LAD 30p, 23m ISR (3.0x20 Promus DES), LCX nl, RCA nondom, nl, EF 25-30% ant, apical, dist inf AK.  Marland Kitchen DVT (deep venous thrombosis)     RECURRENT  . Dyslipidemia   . GERD (gastroesophageal reflux disease)   . Anterior myocardial infarction   . Lupus anticoagulant positive   . S/P drug eluting coronary stent placement, 07/06/14, Promus 2014-07-28  . At risk for sudden cardiac death, EF 25-30% with PVCs Jul 28, 2014  . Ischemic cardiomyopathy 07/06/14    EF 25-30%  . PVC's (premature ventricular contractions)     Past Surgical History  Procedure Laterality Date  . Kidney  stone    . Coronary angioplasty    . Left heart catheterization with coronary angiogram [name] 07/06/2014    Procedure: LEFT HEART CATHETERIZATION WITH CORONARY ANGIOGRAM;  Surgeon: Peter M Martinique, MD;  Location[facility] CATH LAB;  Service: Cardiovascular;  Laterality: [name];  . Percutaneous coronary stent intervention (pci-s) Right 07/06/2014    Procedure: PERCUTANEOUS CORONARY STENT INTERVENTION (PCI-S);  Surgeon: Peter M Martinique, MD;  Location: [facility] CATH LAB;  Service: Cardiovascular;  Laterality: Right;    Current Outpatient Prescriptions  Medication Sig Dispense Refill  . Acetaminophen (TYLENOL PO) Take 1 tablet by mouth daily at 12 noon.    Marland Kitchen allopurinol (ZYLOPRIM) 100 MG tablet Take 100 mg by mouth daily at 12 noon.    Marland Kitchen aspirin EC 81 MG EC tablet Take 1 tablet (81 mg total) by mouth daily.    Marland Kitchen atorvastatin (LIPITOR) 80 MG tablet Take 1 tablet (80 mg total) by mouth daily at 6 PM. 30 tablet 6  . carvedilol (COREG) 12.5 MG tablet Take 1 tablet (12.5 mg total) by mouth 2 (two) times daily with a meal. 60 tablet 6  . clopidogrel (PLAVIX) 75 MG tablet Take 1 tablet (75 mg total) by mouth daily with breakfast. 30 tablet 6  . furosemide (LASIX) 40 MG tablet Take 40 mg by mouth daily.      Marland Kitchen lisinopril (PRINIVIL,ZESTRIL) 5 MG tablet Take 1 tablet (5 mg total) by mouth  daily. 30 tablet 6  . Multiple Vitamin (MULTI-VITAMINS) TABS Take 1 tablet by mouth daily at 12 noon.    . nitroGLYCERIN (NITROSTAT) 0.4 MG SL tablet Place 1 tablet (0.4 mg total) under the tongue every 5 (five) minutes x 3 doses as needed for chest pain. 30 tablet 4  . pantoprazole (PROTONIX) 40 MG tablet Take 1 tablet (40 mg total) by mouth daily. 30 tablet 2  . spironolactone (ALDACTONE) 25 MG tablet Take 1 tablet (25 mg total) by mouth daily. 30 tablet 6  . Tamsulosin HCl (FLOMAX) 0.4 MG CAPS Take 0.4 mg by mouth daily.     . Vitamin D, Ergocalciferol, (DRISDOL) 50000 UNITS CAPS capsule Take 1 capsule by mouth once a week. On the weekends     . warfarin (COUMADIN) 5 MG tablet Take 5 mg by mouth daily at 12 noon. Does not take on Thursdays     No current facility-administered medications for this visit.    Allergies:   Review of patient's allergies indicates no known allergies.    Social History:  The patient  reports that he has never smoked. He does not have any smokeless tobacco history on file. He reports that he does not drink alcohol or use illicit drugs.   Family History:  The patient's family history includes Aneurysm in his father.    ROS:  Please see the history of present illness. All other systems are reviewed and negative.    PHYSICAL EXAM: VS:  BP 90/60 mmHg  Pulse 73  Ht 6' (1.829 m)  Wt 250 lb (113.399 kg)  BMI 33.90 kg/m2 , BMI Body mass index is 33.9 kg/(m^2). GEN: Well nourished, well developed, in no acute distress HEENT: normal Neck: no JVD, carotid bruits, or masses Cardiac: RRR; no murmurs, rubs, or gallops,no edema;  Respiratory:  clear to auscultation bilaterally, normal work of breathing; ecchymotic areas on his chest in the area of the LifeVest, healing. GI: soft, nontender, nondistended, + BS MS: no deformity or atrophy Skin: warm and dry, no rash Neuro:  Strength and sensation are intact Psych: euthymic mood, full affect   EKG:  EKG is ordered today. The ekg ordered today demonstrates sinus rhythm, Q waves in the septal leads, inferolateral T-wave changes consistent with previous MI, minimal change from ECG dated 07/07/2014.   Recent Labs: 07/05/2014: Magnesium 1.9 07/07/2014: BUN 11; Creatinine 1.05; Hemoglobin 14.6; Platelets 171; Potassium 4.0; Sodium 139    Lipid Panel    Component Value Date/Time   CHOL 167 04/09/2009   TRIG 82 04/09/2009   HDL 40 04/09/2009   LDLCALC 110 04/09/2009     Wt Readings from Last 3 Encounters:  07/13/14 250 lb (113.399 kg)  07/07/14 252 lb 3.3 oz (114.4 kg)  04/30/11 269 lb (122.018 kg)     Other studies Reviewed: Additional studies/  records that were reviewed today include: Cardiac catheterization, previous ECGs.  ASSESSMENT AND PLAN:  1.  Non-STEMI: Patient is recovering well since his MI. He has some fatigue and dyspnea on exertion, but feels these are improving. He is compliant with his medications and tolerating triple therapy well.  2. Hypotension: Systolic blood pressure is 90 today. His heart rate is in the 70s and this is lower than usual for him. He is not currently symptomatic.   Advised him that if he does not continue to improve, follow his blood pressure and heart rate carefully and make sure that we don't need to change his medications to allow his blood  pressure run a little higher. Currently he feels okay and does not wish to make any changes.  Current medicines are reviewed at length with the patient today.  The patient does not have concerns regarding medicines.  The following changes have been made:  no change  Labs/ tests ordered today include:  Orders Placed This Encounter  Procedures  . EKG 12-Lead   Disposition:   FU with Dr Martinique as scheduled. Lipid & Liver profiles before visit.  Jonetta Speak, PA-C  07/13/2014 8:53 AM    [facility] [place], [place], [place]  [address] Phone: [phone]; Fax: [phone]

## 2014-07-18 ENCOUNTER — Telehealth: Payer: Self-pay | Admitting: Physician Assistant

## 2014-07-18 NOTE — Telephone Encounter (Signed)
New message      Pt saw [name] last week and he is returning a nurses call

## 2014-07-18 NOTE — Telephone Encounter (Signed)
Spoke with pt and he states that he received a call this past Friday from a nurse after seeing Rosaria Ferries, PA. Informed pt that I was the nurse that had called and left a message in regards to getting some labs scheduled. Informed pt that we had actually already scheduled those later that afternoon when I had called back. Reminded pt of scheduled lab appt. Pt verbalized understanding.

## 2014-07-24 ENCOUNTER — Telehealth (HOSPITAL_COMMUNITY): Payer: Self-pay | Admitting: Cardiac Rehabilitation

## 2014-07-24 NOTE — Telephone Encounter (Signed)
pc to pt to enroll in cardiac rehab. Pt is interested in [facility] Cardiac rehab. Referral sent to [facility] at [facility].

## 2014-07-25 ENCOUNTER — Other Ambulatory Visit (INDEPENDENT_AMBULATORY_CARE_PROVIDER_SITE_OTHER): Payer: Medicare Other | Admitting: *Deleted

## 2014-07-25 ENCOUNTER — Telehealth: Payer: Self-pay | Admitting: Cardiology

## 2014-07-25 DIAGNOSIS — E785 Hyperlipidemia, unspecified: Secondary | ICD-10-CM | POA: Diagnosis not present

## 2014-07-25 DIAGNOSIS — I25110 Atherosclerotic heart disease of native coronary artery with unstable angina pectoris: Secondary | ICD-10-CM

## 2014-07-25 LAB — HEPATIC FUNCTION PANEL
ALBUMIN: 4.3 g/dL (ref 3.5–5.2)
ALT: 25 U/L (ref 0–53)
AST: 24 U/L (ref 0–37)
Alkaline Phosphatase: 48 U/L (ref 39–117)
Bilirubin, Direct: 0.2 mg/dL (ref 0.0–0.3)
TOTAL PROTEIN: 8 g/dL (ref 6.0–8.3)
Total Bilirubin: 0.8 mg/dL (ref 0.2–1.2)

## 2014-07-25 LAB — LIPID PANEL
Cholesterol: 113 mg/dL (ref 0–200)
HDL: 31.9 mg/dL — ABNORMAL LOW (ref >39.00)
LDL Cholesterol: 57 mg/dL (ref 0–99)
NonHDL: 81.1
TRIGLYCERIDES: 122 mg/dL (ref 0.0–149.0)
Total CHOL/HDL Ratio: 4
VLDL: 24.4 mg/dL (ref 0.0–40.0)

## 2014-07-25 NOTE — Addendum Note (Signed)
Addended by: Theodore Demark on: 07/25/2014 05:55 PM   Modules accepted: Medications

## 2014-07-25 NOTE — Telephone Encounter (Signed)
Reduce Coreg to 6.25 mg bid and lisinopril to 2.5 mg daily. Let us know if symptoms persist.   Peter Martinique MD, [facility]

## 2014-07-25 NOTE — Telephone Encounter (Signed)
New dose instructions given to patient - advised to call if symptoms persist - also to keep check on BPs, HR. Understanding verbalized.

## 2014-07-25 NOTE — Telephone Encounter (Signed)
Called pt. He reports lightheadedness, dizziness upon standing. Denies syncope, falls - wanted to be sure to call before worse symptoms.  BP Sunday 85/60, HR 60s Wednesday 80/56, HR 60  Lisinopril (5mg  daily) & coreg (12.5mg  BID) recently added to pt's medication list.  Recently in hospital for NSTEMI 4 weeks ago.  Informed him I would seek Dr. Doug Sou advice on changing meds.

## 2014-07-25 NOTE — Telephone Encounter (Signed)
New message      Pt c/o BP issue: STAT if pt c/o blurred vision, one-sided weakness or slurred speech  1. What are your last 5 BP readings? 80-85/56-60   2. Are you having any other symptoms (ex. Dizziness, headache, blurred vision, passed out)?  Dizzy upon standing 3. What is your BP issue?  bp is too low---pt had heart attack 3wks ago----changed medication---bp is now too low----please advise

## 2014-07-27 ENCOUNTER — Encounter: Payer: Self-pay | Admitting: Cardiology

## 2014-08-02 ENCOUNTER — Ambulatory Visit (INDEPENDENT_AMBULATORY_CARE_PROVIDER_SITE_OTHER): Payer: Medicare Other | Admitting: Cardiology

## 2014-08-02 ENCOUNTER — Encounter: Payer: Self-pay | Admitting: Cardiology

## 2014-08-02 VITALS — BP 108/68 | HR 72 | Ht 72.0 in | Wt 240.6 lb

## 2014-08-02 DIAGNOSIS — I2510 Atherosclerotic heart disease of native coronary artery without angina pectoris: Secondary | ICD-10-CM

## 2014-08-02 DIAGNOSIS — Z955 Presence of coronary angioplasty implant and graft: Secondary | ICD-10-CM | POA: Diagnosis not present

## 2014-08-02 DIAGNOSIS — I82409 Acute embolism and thrombosis of unspecified deep veins of unspecified lower extremity: Secondary | ICD-10-CM | POA: Diagnosis not present

## 2014-08-02 DIAGNOSIS — I2583 Coronary atherosclerosis due to lipid rich plaque: Principal | ICD-10-CM

## 2014-08-02 DIAGNOSIS — I255 Ischemic cardiomyopathy: Secondary | ICD-10-CM

## 2014-08-02 MED ORDER — SPIRONOLACTONE 25 MG PO TABS: 25.0000 mg | ORAL_TABLET | Freq: Every day | ORAL | Status: DC

## 2014-08-02 MED ORDER — CARVEDILOL 6.25 MG PO TABS: 6.2500 mg | ORAL_TABLET | Freq: Two times a day (BID) | ORAL | Status: DC

## 2014-08-02 MED ORDER — LISINOPRIL 2.5 MG PO TABS: 2.5000 mg | ORAL_TABLET | Freq: Every day | ORAL | Status: DC

## 2014-08-02 NOTE — Patient Instructions (Signed)
Stop ASA  Continue your other therapy  I will see you in 2 months with an Echo

## 2014-08-02 NOTE — Progress Notes (Signed)
Cardiology Office Note   Date:  08/02/2014   ID:  Solomon, Skowronek 1945-12-08, MRN [id number]  PCP:  Guadlupe Spanish, MD  Cardiologist:  Dr Martinique  Serenna Deroy Martinique, MD   Chief Complaint  Patient presents with  . Follow-up    dizziness when he stands up,left calf bruise, left leg pain    History of Present Illness: Kevin Hart is a 69 y.o. 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  He initially presented with chest pain. He had elevated troponin and inferolateral ST depression.  Cath showed 90% mid LAD stenosis within the stent. EF was 25-30%.  He was fitted with a Lifevest. He was diuresed and aldactone added. Following his cath procedure he had difficulty with memory loss and word finding. This has improved. He has continued to lose weight from 252 lbs at DC to 240 today. One week ago he developed a spontaneous hematoma in his left calf. INR was high at 4.2. He does note some BP reduction to 80 systolic and lisinopril and Coreg doses were reduced.   He states he is feeling better today. Still feels some weakness and unsteadiness. No chest pain or SOB.    Past Medical History  Diagnosis Date  . CAD (coronary artery disease)     a. prior LAD stenting;  b. 06/2014 NSTEMI/PCI: LM nl, LAD 30p, 80m ISR (3.0x20 Promus DES), LCX nl, RCA nondom, nl, EF 25-30% ant, apical, dist inf AK.  Marland Kitchen DVT (deep venous thrombosis)     RECURRENT  . Dyslipidemia   . GERD (gastroesophageal reflux disease)   . Anterior myocardial infarction   . Lupus anticoagulant positive   . S/P drug eluting coronary stent placement, 07/06/14, Promus 24-Jul-2014  . At risk for sudden cardiac death, EF 25-30% with PVCs 07/24/2014  . Ischemic cardiomyopathy 07/06/14    EF 25-30%  . PVC's (premature ventricular contractions)      Past Surgical History  Procedure Laterality Date  . Kidney stone    . Coronary angioplasty    . Left heart catheterization with coronary angiogram [name] 07/06/2014    Procedure: LEFT HEART CATHETERIZATION WITH CORONARY ANGIOGRAM;  Surgeon: Deniese Oberry M Martinique, MD;  Location: [facility] CATH LAB;  Service: Cardiovascular;  Laterality: [name];  . Percutaneous coronary stent intervention (pci-s) Right 07/06/2014    Procedure: PERCUTANEOUS CORONARY STENT INTERVENTION (PCI-S);  Surgeon: Royalty Fakhouri M Martinique, MD;  Location: [facility] CATH LAB;  Service: Cardiovascular;  Laterality: Right;    Current Outpatient Prescriptions  Medication Sig Dispense Refill  . allopurinol (ZYLOPRIM) 100 MG tablet Take 100 mg by mouth daily at 12 noon.    Marland Kitchen atorvastatin (LIPITOR) 80 MG tablet Take 1 tablet (80 mg total) by mouth daily at 6 PM. 30 tablet 6  . clopidogrel (PLAVIX) 75 MG tablet Take 1 tablet (75 mg total) by mouth daily with breakfast. 30 tablet 6  . furosemide (LASIX) 40 MG tablet Take 40 mg by mouth daily.      . Multiple Vitamin (MULTI-VITAMINS) TABS Take 1 tablet by mouth daily at 12 noon.    . nitroGLYCERIN (NITROSTAT) 0.4 MG SL tablet Place 1 tablet (0.4 mg total) under the tongue every 5 (five) minutes x 3 doses as needed for chest pain. 30 tablet 4  . pantoprazole (PROTONIX) 40  MG tablet Take 1 tablet (40 mg total) by mouth daily. 30 tablet 2  . spironolactone (ALDACTONE) 25 MG tablet Take 1 tablet (25 mg total) by mouth daily. 30 tablet 6  . Tamsulosin HCl (FLOMAX) 0.4 MG CAPS Take 0.4 mg by mouth daily.     . Vitamin D, Ergocalciferol, (DRISDOL) 50000 UNITS CAPS capsule Take 1 capsule by mouth once a week. On the weekends    . warfarin (COUMADIN) 5 MG tablet Take 5 mg by mouth daily at 12 noon. Does not take on tuesdays and fridays    . carvedilol (COREG) 6.25 MG tablet Take 1 tablet (6.25 mg total) by mouth 2 (two) times daily. 90 tablet 6  . lisinopril (PRINIVIL,ZESTRIL) 2.5 MG tablet Take 1 tablet (2.5 mg total) by  mouth daily. 30 tablet 6   No current facility-administered medications for this visit.    Allergies:   Review of patient's allergies indicates no known allergies.    Social History:  The patient  reports that he has never smoked. He does not have any smokeless tobacco history on file. He reports that he does not drink alcohol or use illicit drugs.   Family History:  The patient's family history includes Aneurysm in his father.    ROS:  Please see the history of present illness. All other systems are reviewed and negative.    PHYSICAL EXAM: VS:  BP 108/68 mmHg  Pulse 72  Ht 6' (1.829 m)  Wt 240 lb 9.6 oz (109.135 kg)  BMI 32.62 kg/m2 , BMI Body mass index is 32.62 kg/(m^2). GEN: Well nourished, well developed, in no acute distress HEENT: normal Neck: no JVD, carotid bruits, or masses Cardiac: RRR; no murmurs, rubs, or gallops,no edema;  Respiratory:  clear to auscultation bilaterally, normal work of breathing; ecchymotic areas on his chest in the area of the LifeVest, healing. GI: soft, nontender, nondistended, + BS MS: no deformity or atrophy Skin: warm and dry, no rash Ext: hematoma left calf. No redness.  Neuro:  Strength and sensation are intact Psych: euthymic mood, full affect   EKG:  EKG is not  ordered today.    Recent Labs: 07/05/2014: Magnesium 1.9 07/07/2014: BUN 11; Creatinine 1.05; Hemoglobin 14.6; Platelets 171; Potassium 4.0; Sodium 139 07/25/2014: ALT 25    Lipid Panel    Component Value Date/Time   CHOL 113 07/25/2014 1029   TRIG 122.0 07/25/2014 1029   HDL 31.90* 07/25/2014 1029   CHOLHDL 4 07/25/2014 1029   VLDL 24.4 07/25/2014 1029   LDLCALC 57 07/25/2014 1029     Wt Readings from Last 3 Encounters:  08/02/14 240 lb 9.6 oz (109.135 kg)  07/13/14 250 lb (113.399 kg)  07/07/14 252 lb 3.3 oz (114.4 kg)     Other studies Reviewed: Additional studies/ records that were reviewed today include: Cardiac catheterization, previous ECGs. Labs from  primary care 07/10/14. Normal chemistry panel except creatinine 1.4. Normal CBC. BNP 143.77  ASSESSMENT AND PLAN:  1.  Non-STEMI: s/p DES of mid LAD for instent restenosis.  He is asymptomatic. Will stop ASA at this point and continue Coumadin and Plavix. Target INR 2.5.   2. Hypotension: improved with reduction in meds. Continue current therapy.  3. Chronic systolic CHF. EF 25-30%. Continue medical management and Lifevest. Will recheck Echo in 2 months. If EF still <35% will need ICD. Continue sodium restriction. Monitor weight.   4. Memory loss post cath procedure. I think this is probably related to sedation but cannot rule out  neurologic event peri-procedure. Symptoms improving.   5. Hyperlipidemia now on high dose lipitor. Excellent control.   6. History of recurrent DVT with positive lupus anticoagulant. On long term Coumadin.   Current medicines are reviewed at length with the patient today.  The patient does not have concerns regarding medicines.  The following changes have been made:  Stop ASA.  Labs/ tests ordered today include:  Orders Placed This Encounter  Procedures  . Echocardiogram   Disposition:   FU with Dr Martinique in 2 months with Echo.  Signed, Karrington Mccravy Martinique, MD, [facility]  08/02/2014 8:48 PM    [address], [place], [place]  [address] Phone: [phone]; Fax: [phone]

## 2014-08-06 ENCOUNTER — Encounter: Payer: Self-pay | Admitting: Cardiology

## 2014-10-02 ENCOUNTER — Ambulatory Visit (HOSPITAL_COMMUNITY)
Admission: RE | Admit: 2014-10-02 | Discharge: 2014-10-02 | Disposition: A | Discharge status: Home / Self Care | Payer: Medicare Other | Source: Ambulatory Visit | Attending: Cardiology | Admitting: Cardiology

## 2014-10-02 DIAGNOSIS — R29898 Other symptoms and signs involving the musculoskeletal system: Secondary | ICD-10-CM | POA: Insufficient documentation

## 2014-10-02 DIAGNOSIS — I517 Cardiomegaly: Secondary | ICD-10-CM | POA: Insufficient documentation

## 2014-10-02 DIAGNOSIS — I351 Nonrheumatic aortic (valve) insufficiency: Secondary | ICD-10-CM | POA: Insufficient documentation

## 2014-10-02 DIAGNOSIS — Z955 Presence of coronary angioplasty implant and graft: Secondary | ICD-10-CM | POA: Diagnosis not present

## 2014-10-02 DIAGNOSIS — I2510 Atherosclerotic heart disease of native coronary artery without angina pectoris: Secondary | ICD-10-CM | POA: Diagnosis not present

## 2014-10-02 DIAGNOSIS — I2583 Coronary atherosclerosis due to lipid rich plaque: Secondary | ICD-10-CM

## 2014-10-02 DIAGNOSIS — I255 Ischemic cardiomyopathy: Secondary | ICD-10-CM

## 2014-10-02 DIAGNOSIS — I82409 Acute embolism and thrombosis of unspecified deep veins of unspecified lower extremity: Secondary | ICD-10-CM

## 2014-10-03 ENCOUNTER — Other Ambulatory Visit: Payer: Self-pay

## 2014-10-03 ENCOUNTER — Telehealth: Payer: Self-pay | Admitting: Cardiology

## 2014-10-03 DIAGNOSIS — Z9189 Other specified personal risk factors, not elsewhere classified: Secondary | ICD-10-CM

## 2014-10-03 NOTE — Telephone Encounter (Signed)
Pt's wife is calling in to speak with Kevin Hart getting an appt with an EP doctor. Please call  back   Thanks

## 2014-10-03 NOTE — Telephone Encounter (Signed)
Returned call to patient's wife no answer.LMTC. 

## 2014-10-04 NOTE — Telephone Encounter (Signed)
Returned call to patient's wife EP scheduler out of office this week, she will be calling you next week with EP appointment.

## 2014-10-09 ENCOUNTER — Ambulatory Visit: Payer: Medicare Other | Admitting: Internal Medicine

## 2014-10-09 ENCOUNTER — Other Ambulatory Visit: Payer: Self-pay

## 2014-10-09 ENCOUNTER — Ambulatory Visit (INDEPENDENT_AMBULATORY_CARE_PROVIDER_SITE_OTHER): Payer: Medicare Other | Admitting: Cardiology

## 2014-10-09 ENCOUNTER — Encounter: Payer: Self-pay | Admitting: Cardiology

## 2014-10-09 VITALS — BP 100/64 | HR 84 | Ht 72.0 in | Wt 234.0 lb

## 2014-10-09 DIAGNOSIS — I255 Ischemic cardiomyopathy: Secondary | ICD-10-CM | POA: Diagnosis not present

## 2014-10-09 DIAGNOSIS — I2583 Coronary atherosclerosis due to lipid rich plaque: Principal | ICD-10-CM

## 2014-10-09 DIAGNOSIS — I2510 Atherosclerotic heart disease of native coronary artery without angina pectoris: Secondary | ICD-10-CM

## 2014-10-09 DIAGNOSIS — Z955 Presence of coronary angioplasty implant and graft: Secondary | ICD-10-CM | POA: Diagnosis not present

## 2014-10-09 MED ORDER — NITROGLYCERIN 0.4 MG SL SUBL: 0.4000 mg | SUBLINGUAL_TABLET | SUBLINGUAL | Status: DC | PRN

## 2014-10-09 NOTE — Progress Notes (Signed)
Cardiology Office Note   Date:  10/09/2014   ID:  Kevin, Hart [birth year], MRN [id number]  PCP:  Guadlupe Spanish, MD  Cardiologist:  Dr Hart  Kevin Eley Martinique, MD   Chief Complaint  Patient presents with  . Follow-up     followup ECHO; no chest pain,  no shortness of breath, edema-has, nothing new, no pain in legs, no cramping in legs, no lightheadedness, no dizziness    History of Present Illness: Kevin Hart is a 69 y.o. 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  He initially presented with chest pain. He had elevated troponin and inferolateral ST depression.  Cath showed 90% mid LAD stenosis within the stent. EF was 25-30%.  He was fitted with a Lifevest. He was diuresed and aldactone added. Following his cath procedure he had difficulty with memory loss and word finding. This has improved. He has continued to lose weight and has lost an additional 6 lbs. He is now participating in Heart strides.. Titration of medications have been limited by orthostasis but he is tolerating current meds. He denies any chest pain or SOB. Energy is good. Repeat Echo 10/02/14 showed EF still 25-30%. He is scheduled to see Dr. Rayann Heman on August 15. Still wearing Lifevest.   Past Medical History  Diagnosis Date  . CAD (coronary artery disease)     a. prior LAD stenting;  b. 06/2014 NSTEMI/PCI: LM nl, LAD 30p, 60m ISR (3.0x20 Promus DES), LCX nl, RCA nondom, nl, EF 25-30% ant, apical, dist inf AK.  Marland Kitchen DVT (deep venous thrombosis)     RECURRENT  . Dyslipidemia   . GERD (gastroesophageal reflux disease)   . Anterior myocardial infarction   . Lupus anticoagulant positive   . S/P drug eluting coronary stent placement, 07/06/14, Promus 2014-08-04  . At risk for sudden cardiac death, EF 25-30% with PVCs  08-04-2014  . Ischemic cardiomyopathy 07/06/14    EF 25-30%  . PVC's (premature ventricular contractions)     Past Surgical History  Procedure Laterality Date  . Kidney stone    . Coronary angioplasty    . Left heart catheterization with coronary angiogram [name] 07/06/2014    Procedure: LEFT HEART CATHETERIZATION WITH CORONARY ANGIOGRAM;  Surgeon: Kimberlin Scheel M Martinique, MD;  Location: [facility] CATH LAB;  Service: Cardiovascular;  Laterality: [name];  . Percutaneous coronary stent intervention (pci-s) Right 07/06/2014    Procedure: PERCUTANEOUS CORONARY STENT INTERVENTION (PCI-S);  Surgeon: Mckaylie Vasey M Martinique, MD;  Location: [facility] CATH LAB;  Service: Cardiovascular;  Laterality: Right;    Current Outpatient Prescriptions  Medication Sig Dispense Refill  . allopurinol (ZYLOPRIM) 100 MG tablet Take 100 mg by mouth daily at 12 noon.    Marland Kitchen atorvastatin (LIPITOR) 80 MG tablet Take 1 tablet (80 mg total) by mouth daily at 6 PM. 30 tablet 6  . carvedilol (COREG) 6.25 MG tablet Take 1 tablet (6.25 mg total) by mouth 2 (two) times daily. 90 tablet 6  . clopidogrel (PLAVIX) 75 MG tablet Take 1 tablet (75 mg total) by mouth daily with breakfast. 30 tablet 6  . furosemide (LASIX) 40 MG tablet Take 40 mg by mouth daily.      Marland Kitchen lisinopril (PRINIVIL,ZESTRIL) 2.5 MG tablet Take 1 tablet (2.5 mg total) by mouth daily. 30 tablet 6  .  Multiple Vitamin (MULTI-VITAMINS) TABS Take 1 tablet by mouth daily at 12 noon.    . nitroGLYCERIN (NITROSTAT) 0.4 MG SL tablet Place 1 tablet (0.4 mg total) under the tongue every 5 (five) minutes x 3 doses as needed for chest pain. 30 tablet 4  . pantoprazole (PROTONIX) 40 MG tablet Take 1 tablet (40 mg total) by mouth daily. 30 tablet 2  . spironolactone (ALDACTONE) 25 MG tablet Take 1 tablet (25 mg total) by mouth daily. 30 tablet 6  . Tamsulosin HCl (FLOMAX) 0.4 MG CAPS Take 0.4 mg by mouth daily.     . Vitamin D, Ergocalciferol, (DRISDOL) 50000 UNITS CAPS capsule Take 1 capsule by mouth once a week.  On the weekends    . warfarin (COUMADIN) 5 MG tablet Take 5 mg by mouth daily at 12 noon. Does not take on tuesdays and fridays     No current facility-administered medications for this visit.    Allergies:   Review of patient's allergies indicates no known allergies.    Social History:  The patient  reports that he has never smoked. He does not have any smokeless tobacco history on file. He reports that he does not drink alcohol or use illicit drugs.   Family History:  The patient's family history includes Aneurysm in his father.    ROS:  Please see the history of present illness. All other systems are reviewed and negative.    PHYSICAL EXAM: VS:  BP 100/64 mmHg  Pulse 84  Ht 6' (1.829 m)  Wt 106.142 kg (234 lb)  BMI 31.73 kg/m2 , BMI Body mass index is 31.73 kg/(m^2). GEN: Well nourished, well developed, in no acute distress HEENT: normal Neck: no JVD, carotid bruits, or masses Cardiac: RRR; no murmurs, rubs, or gallops,no edema;  Respiratory:  clear to auscultation bilaterally GI: soft, nontender, nondistended, + BS MS: no deformity or atrophy Skin: warm and dry, no rash Ext: hematoma left calf. No redness.  Neuro:  Strength and sensation are intact Psych: euthymic mood, full affect   EKG:  EKG is not  ordered today.    Recent Labs: 07/05/2014: Magnesium 1.9 07/07/2014: BUN 11; Creatinine, Ser 1.05; Hemoglobin 14.6; Platelets 171; Potassium 4.0; Sodium 139 07/25/2014: ALT 25    Lipid Panel    Component Value Date/Time   CHOL 113 07/25/2014 1029   TRIG 122.0 07/25/2014 1029   HDL 31.90* 07/25/2014 1029   CHOLHDL 4 07/25/2014 1029   VLDL 24.4 07/25/2014 1029   LDLCALC 57 07/25/2014 1029     Wt Readings from Last 3 Encounters:  10/09/14 106.142 kg (234 lb)  08/02/14 109.135 kg (240 lb 9.6 oz)  07/13/14 113.399 kg (250 lb)     Other studies Reviewed: Echo 10/02/14:Study Conclusions  - Left ventricle: The cavity size was normal. Wall thickness was normal.  Systolic function was severely reduced. The estimated ejection fraction was in the range of 25% to 30%. There is akinesis of the anteroseptal and apical myocardium. Doppler parameters are consistent with abnormal left ventricular relaxation (grade 1 diastolic dysfunction). - Aortic valve: There was mild regurgitation. - Aortic root: The aortic root was mildly dilated. - Left atrium: The atrium was mildly dilated. - Right atrium: The atrium was mildly dilated.  Impressions:  - Akinesis of the anteroseptal wall and apex with overall severely reduced LV function; grade 1 diastolic dysfunction; mild AI; mlld LAE and RAE.   ASSESSMENT AND PLAN:  1.  Non-STEMI: s/p DES of mid LAD for instent restenosis  in April 2016.  He is asymptomatic. Continue Coumadin and Plavix. Target INR 2.5.   2.  Chronic systolic CHF. EF 25-30%- persistent. Continue medical management and Lifevest. Dr. Rayann Heman to see for ICD. No indication for BiV pacing.   3. Memory loss post cath procedure. ? related to sedation but cannot rule out neurologic event peri-procedure. Symptoms resolved  4. Hyperlipidemia now on high dose lipitor. Excellent control.   5. History of recurrent DVT with positive lupus anticoagulant. On long term Coumadin.   Current medicines are reviewed at length with the patient today.  The patient does not have concerns regarding medicines.  The following changes have been made:  None  Labs/ tests ordered today include:  No orders of the defined types were placed in this encounter.   Disposition:   FU with Dr Hart in 4 months  Signed, Shontez Sermon Martinique, MD, [facility]  10/09/2014 4:50 PM    [facility] [address], [place], [place]  [address] Phone: [phone]; Fax: [phone]

## 2014-10-09 NOTE — Patient Instructions (Signed)
Continue your current therapy  I will see you in 4 months  

## 2014-11-01 ENCOUNTER — Ambulatory Visit: Payer: Medicare Other | Admitting: Cardiology

## 2014-11-05 ENCOUNTER — Encounter: Payer: Self-pay | Admitting: *Deleted

## 2014-11-05 ENCOUNTER — Ambulatory Visit (INDEPENDENT_AMBULATORY_CARE_PROVIDER_SITE_OTHER): Payer: Medicare Other | Admitting: Internal Medicine

## 2014-11-05 ENCOUNTER — Encounter: Payer: Self-pay | Admitting: Internal Medicine

## 2014-11-05 VITALS — BP 104/68 | HR 76 | Ht 72.0 in | Wt 233.0 lb

## 2014-11-05 DIAGNOSIS — I255 Ischemic cardiomyopathy: Secondary | ICD-10-CM

## 2014-11-05 DIAGNOSIS — I2510 Atherosclerotic heart disease of native coronary artery without angina pectoris: Secondary | ICD-10-CM

## 2014-11-05 DIAGNOSIS — I2109 ST elevation (STEMI) myocardial infarction involving other coronary artery of anterior wall: Secondary | ICD-10-CM

## 2014-11-05 DIAGNOSIS — I2583 Coronary atherosclerosis due to lipid rich plaque: Secondary | ICD-10-CM

## 2014-11-05 NOTE — Progress Notes (Signed)
Electrophysiology Office Note   Date:  11/05/2014   ID:  Kevin Hart, Kevin Hart [birth year], MRN [id number]  PCP:  Guadlupe Spanish, MD  Cardiologist:  Dr Martinique Primary Electrophysiologist: Thompson Grayer, MD    Chief Complaint  Patient presents with  . At risk for sudden cardiac death     History of Present Illness: Kevin Hart is a 69 y.o. male who presents today for electrophysiology evaluation.   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  He presented 08-01-14 with non-STEMI and was found to have 90% mid LAD stenosis within the stent. EF was 25-30%.  He underwent PCI.  He reports very poor memory which he attributed to versed lasting several months.  He was fitted with a Lifevest. Following his cath procedure he had difficulty with memory loss and word finding. This has resolved.  He is now participating in Heart strides.. Titration of medications have been limited by orthostasis but he is tolerating current meds.  Repeat Echo 10/02/14 showed EF still 25-30%.  Today, he denies symptoms of palpitations, chest pain, shortness of breath, orthopnea, PND, lower extremity edema, claudication, dizziness, presyncope, syncope, bleeding, or neurologic sequela. The patient is tolerating medications without difficulties and is otherwise without complaint today.    Past Medical History  Diagnosis Date  . CAD (coronary artery disease)     a. prior LAD stenting;  b. 06/2014 NSTEMI/PCI: LM nl, LAD 30p, 67m ISR (3.0x20 Promus DES), LCX nl, RCA nondom, nl, EF 25-30% ant, apical, dist inf AK.  Marland Kitchen DVT (deep venous thrombosis)     RECURRENT left leg  . Dyslipidemia   . GERD (gastroesophageal reflux disease)   . Anterior myocardial infarction   . Lupus anticoagulant positive     on coumadin  . S/P drug eluting coronary stent placement, 07/06/14, Promus  08/04/2014  . At risk for sudden cardiac death, EF 25-30% with PVCs 08-04-14  . Ischemic cardiomyopathy 07/06/14    EF 25-30%  . PVC's (premature ventricular contractions)   . Rotator cuff tear     R side  . Gout   . BPH (benign prostatic hyperplasia)    Past Surgical History  Procedure Laterality Date  . Kidney stone    . Coronary angioplasty    . Left heart catheterization with coronary angiogram [name] 07/06/2014    Procedure: LEFT HEART CATHETERIZATION WITH CORONARY ANGIOGRAM;  Surgeon: Peter M Martinique, MD;  Location[facility] CATH LAB;  Service: Cardiovascular;  Laterality: [name];  . Percutaneous coronary stent intervention (pci-s) Right 07/06/2014    Procedure: PERCUTANEOUS CORONARY STENT INTERVENTION (PCI-S);  Surgeon: Peter M Martinique, MD;  Location: [facility] CATH LAB;  Service: Cardiovascular;  Laterality: Right;     Current Outpatient Prescriptions  Medication Sig Dispense Refill  . allopurinol (ZYLOPRIM) 100 MG tablet Take 100 mg by mouth daily with breakfast.     . atorvastatin (LIPITOR) 80 MG tablet Take 1 tablet (80 mg total) by mouth daily at 6 PM. 30 tablet 6  . carvedilol (COREG) 6.25 MG tablet Take 1 tablet (6.25 mg total) by mouth 2 (two) times daily. 90 tablet 6  . clopidogrel (PLAVIX) 75 MG tablet Take 1 tablet (75 mg total) by mouth daily with breakfast. 30 tablet 6  . furosemide (LASIX) 40 MG tablet Take 40 mg by mouth daily.      Marland Kitchen lisinopril (  PRINIVIL,ZESTRIL) 2.5 MG tablet Take 1 tablet (2.5 mg total) by mouth daily. 30 tablet 6  . Multiple Vitamin (MULTI-VITAMINS) TABS Take 1 tablet by mouth daily at 12 noon.    . nitroGLYCERIN (NITROSTAT) 0.4 MG SL tablet Place 1 tablet (0.4 mg total) under the tongue every 5 (five) minutes x 3 doses as needed for chest pain. 25 tablet 11  . pantoprazole (PROTONIX) 40 MG tablet Take 1 tablet (40 mg total) by mouth daily. 30 tablet 2  . spironolactone (ALDACTONE) 25 MG tablet Take 1 tablet (25 mg total) by mouth daily. 30 tablet 6  . Tamsulosin  HCl (FLOMAX) 0.4 MG CAPS Take 0.4 mg by mouth daily.     . Vitamin D, Ergocalciferol, (DRISDOL) 50000 UNITS CAPS capsule Take 1 capsule by mouth once a week.     . warfarin (COUMADIN) 5 MG tablet Take 5 mg by mouth as directed. Do not take on Thursdays     No current facility-administered medications for this visit.    Allergies:   Review of patient's allergies indicates no known allergies.   Social History:  The patient  reports that he has never smoked. He does not have any smokeless tobacco history on file. He reports that he does not drink alcohol or use illicit drugs.   Family History:  The patient's  family history includes Aneurysm in his father.    ROS:  Please see the history of present illness.   All other systems are reviewed and negative.    PHYSICAL EXAM: VS:  BP 104/68 mmHg  Pulse 76  Ht 6' (1.829 m)  Wt 105.688 kg (233 lb)  BMI 31.59 kg/m2 , BMI Body mass index is 31.59 kg/(m^2). GEN: Well nourished, well developed, in no acute distress HEENT: normal Neck: no JVD, carotid bruits, or masses Cardiac: RRR; no murmurs, rubs, or gallops,no edema  Respiratory:  clear to auscultation bilaterally, normal work of breathing GI: soft, nontender, nondistended, + BS MS: no deformity or atrophy Skin: warm and dry  Neuro:  Strength and sensation are intact Psych: euthymic mood, full affect  EKG:  EKG is ordered today. The ekg ordered today shows sinus rhythm 76 bpm, PR 202, LAA, anterior infarction, QRS 90 msec, Qtc 438   Recent Labs: 07/05/2014: Magnesium 1.9 07/07/2014: BUN 11; Creatinine, Ser 1.05; Hemoglobin 14.6; Platelets 171; Potassium 4.0; Sodium 139 07/25/2014: ALT 25    Lipid Panel     Component Value Date/Time   CHOL 113 07/25/2014 1029   TRIG 122.0 07/25/2014 1029   HDL 31.90* 07/25/2014 1029   CHOLHDL 4 07/25/2014 1029   VLDL 24.4 07/25/2014 1029   LDLCALC 57 07/25/2014 1029     Wt Readings from Last 3 Encounters:  11/05/14 105.688 kg (233 lb)    10/09/14 106.142 kg (234 lb)  08/02/14 109.135 kg (240 lb 9.6 oz)      Other studies Reviewed: Additional studies/ records that were reviewed today include: echo, Dr Morrison Old notes  Review of the above records today demonstrates: EF 25%   ASSESSMENT AND PLAN:  1.  Ischemic CM/ CAD The patient has an ischemic CM (EF 25%), NYHA Class II CHF, and CAD. At this time, he meets MADIT II/ SCD-HeFT criteria for ICD implantation for primary prevention of sudden death. QRS <130 msec.  He therefore does not meet criteria for CRT. Risks, benefits, alternatives to ICD implantation were discussed in detail with the patient today. The patient  understands that the risks include but are not limited  to bleeding, infection, pneumothorax, perforation, tamponade, vascular damage, renal failure, MI, stroke, death, inappropriate shocks, and lead dislodgement and wishes to proceed.  We will therefore schedule device implantation at the next available time.  Current medicines are reviewed at length with the patient today.   The patient does not have concerns regarding his medicines.  The following changes were made today:  none    Signed, Thompson Grayer, MD  11/05/2014 8:53 AM     [facility] [address] [place] [address] [phone] (office) [phone] (fax)

## 2014-11-05 NOTE — Patient Instructions (Signed)
Medication Instructions:  Your physician recommends that you continue on your current medications as directed. Please refer to the Current Medication list given to you today.   Labwork: Your physician recommends that you return for lab work on 12/15/14 at 11:00am  You do not have to fast   Testing/Procedures: Your physician has recommended that you have a defibrillator inserted. An implantable cardioverter defibrillator (ICD) is a small device that is placed in your chest or, in rare cases, your abdomen. This device uses electrical pulses or shocks to help control life-threatening, irregular heartbeats that could lead the heart to suddenly stop beating (sudden cardiac arrest). Leads are attached to the ICD that goes into your heart. This is done in the hospital and usually requires an overnight stay. Please see the instruction sheet given to you today for more information.    Follow-Up: Your physician recommends that you schedule a follow-up appointment in: 10-14 days from 11/27/14 in the device clinic for wound check and 3 months from 11/27/14 with Dr Rayann Heman   Any Other Special Instructions Will Be Listed Below (If Applicable).

## 2014-11-14 ENCOUNTER — Other Ambulatory Visit: Payer: Medicare Other

## 2014-11-14 ENCOUNTER — Other Ambulatory Visit (INDEPENDENT_AMBULATORY_CARE_PROVIDER_SITE_OTHER): Payer: Medicare Other | Admitting: *Deleted

## 2014-11-14 DIAGNOSIS — I255 Ischemic cardiomyopathy: Secondary | ICD-10-CM | POA: Diagnosis not present

## 2014-11-14 DIAGNOSIS — I2109 ST elevation (STEMI) myocardial infarction involving other coronary artery of anterior wall: Secondary | ICD-10-CM | POA: Diagnosis not present

## 2014-11-14 LAB — BASIC METABOLIC PANEL WITH GFR
BUN: 23 mg/dL (ref 6–23)
CO2: 26 meq/L (ref 19–32)
Calcium: 9.4 mg/dL (ref 8.4–10.5)
Chloride: 104 meq/L (ref 96–112)
Creatinine, Ser: 1.17 mg/dL (ref 0.40–1.50)
GFR: 65.41 mL/min
Glucose, Bld: 133 mg/dL — ABNORMAL HIGH (ref 70–99)
Potassium: 3.8 meq/L (ref 3.5–5.1)
Sodium: 137 meq/L (ref 135–145)

## 2014-11-14 LAB — CBC WITH DIFFERENTIAL/PLATELET
BASOS ABS: 0 10*3/uL (ref 0.0–0.1)
Basophils Relative: 0.7 % (ref 0.0–3.0)
EOS ABS: 0.2 10*3/uL (ref 0.0–0.7)
Eosinophils Relative: 3.4 % (ref 0.0–5.0)
HCT: 40.2 % (ref 39.0–52.0)
Hemoglobin: 13.8 g/dL (ref 13.0–17.0)
LYMPHS ABS: 2 10*3/uL (ref 0.7–4.0)
Lymphocytes Relative: 31 % (ref 12.0–46.0)
MCHC: 34.3 g/dL (ref 30.0–36.0)
MCV: 89.8 fl (ref 78.0–100.0)
MONO ABS: 0.6 10*3/uL (ref 0.1–1.0)
MONOS PCT: 8.8 % (ref 3.0–12.0)
NEUTROS ABS: 3.6 10*3/uL (ref 1.4–7.7)
NEUTROS PCT: 56.1 % (ref 43.0–77.0)
PLATELETS: 223 10*3/uL (ref 150.0–400.0)
RBC: 4.47 Mil/uL (ref 4.22–5.81)
RDW: 14.7 % (ref 11.5–15.5)
WBC: 6.4 10*3/uL (ref 4.0–10.5)

## 2014-11-14 LAB — PROTIME-INR
INR: 2 ratio — ABNORMAL HIGH (ref 0.8–1.0)
Prothrombin Time: 21.8 s — ABNORMAL HIGH (ref 9.6–13.1)

## 2014-11-27 ENCOUNTER — Encounter (HOSPITAL_COMMUNITY): Payer: Self-pay | Admitting: Internal Medicine

## 2014-11-27 ENCOUNTER — Ambulatory Visit (HOSPITAL_COMMUNITY)
Admission: RE | Admit: 2014-11-27 | Discharge: 2014-11-27 | Disposition: A | Discharge status: Home / Self Care | Payer: Medicare Other | Source: Ambulatory Visit | Attending: Internal Medicine | Admitting: Internal Medicine

## 2014-11-27 ENCOUNTER — Ambulatory Visit (HOSPITAL_COMMUNITY): Payer: Medicare Other

## 2014-11-27 ENCOUNTER — Encounter (HOSPITAL_COMMUNITY)
Admission: RE | Disposition: A | Discharge status: Home / Self Care | Payer: Medicare Other | Source: Ambulatory Visit | Attending: Internal Medicine

## 2014-11-27 DIAGNOSIS — Z79899 Other long term (current) drug therapy: Secondary | ICD-10-CM | POA: Insufficient documentation

## 2014-11-27 DIAGNOSIS — I252 Old myocardial infarction: Secondary | ICD-10-CM | POA: Diagnosis not present

## 2014-11-27 DIAGNOSIS — Z7902 Long term (current) use of antithrombotics/antiplatelets: Secondary | ICD-10-CM | POA: Insufficient documentation

## 2014-11-27 DIAGNOSIS — Z7901 Long term (current) use of anticoagulants: Secondary | ICD-10-CM | POA: Diagnosis not present

## 2014-11-27 DIAGNOSIS — I82509 Chronic embolism and thrombosis of unspecified deep veins of unspecified lower extremity: Secondary | ICD-10-CM | POA: Insufficient documentation

## 2014-11-27 DIAGNOSIS — N400 Enlarged prostate without lower urinary tract symptoms: Secondary | ICD-10-CM | POA: Diagnosis not present

## 2014-11-27 DIAGNOSIS — I509 Heart failure, unspecified: Secondary | ICD-10-CM | POA: Diagnosis not present

## 2014-11-27 DIAGNOSIS — I255 Ischemic cardiomyopathy: Secondary | ICD-10-CM | POA: Insufficient documentation

## 2014-11-27 DIAGNOSIS — Z951 Presence of aortocoronary bypass graft: Secondary | ICD-10-CM | POA: Diagnosis not present

## 2014-11-27 DIAGNOSIS — Z959 Presence of cardiac and vascular implant and graft, unspecified: Secondary | ICD-10-CM

## 2014-11-27 DIAGNOSIS — E785 Hyperlipidemia, unspecified: Secondary | ICD-10-CM | POA: Diagnosis not present

## 2014-11-27 DIAGNOSIS — I519 Heart disease, unspecified: Secondary | ICD-10-CM | POA: Diagnosis present

## 2014-11-27 DIAGNOSIS — I2510 Atherosclerotic heart disease of native coronary artery without angina pectoris: Secondary | ICD-10-CM | POA: Diagnosis not present

## 2014-11-27 DIAGNOSIS — I2109 ST elevation (STEMI) myocardial infarction involving other coronary artery of anterior wall: Secondary | ICD-10-CM

## 2014-11-27 HISTORY — PX: EP IMPLANTABLE DEVICE: SHX172B

## 2014-11-27 LAB — SURGICAL PCR SCREEN
MRSA, PCR: NEGATIVE
Staphylococcus aureus: NEGATIVE

## 2014-11-27 LAB — PROTIME-INR
INR: 1.28 (ref 0.00–1.49)
PROTHROMBIN TIME: 16.2 s — AB (ref 11.6–15.2)

## 2014-11-27 SURGERY — ICD IMPLANT

## 2014-11-27 MED ORDER — LISINOPRIL 2.5 MG PO TABS
2.5000 mg | ORAL_TABLET | Freq: Every day | ORAL | Status: DC
  Administered 2014-11-27: 2.5 mg via ORAL
  Filled 2014-11-27: qty 1

## 2014-11-27 MED ORDER — HEPARIN (PORCINE) IN NACL 2-0.9 UNIT/ML-% IJ SOLN
INTRAMUSCULAR | Status: DC | PRN
  Administered 2014-11-27: 08:00:00

## 2014-11-27 MED ORDER — ONDANSETRON HCL 4 MG/2ML IJ SOLN: 4.0000 mg | Freq: Four times a day (QID) | INTRAMUSCULAR | Status: DC | PRN

## 2014-11-27 MED ORDER — WARFARIN SODIUM 5 MG PO TABS: 5.0000 mg | ORAL_TABLET | Freq: Once | ORAL | Status: DC

## 2014-11-27 MED ORDER — MUPIROCIN 2 % EX OINT
TOPICAL_OINTMENT | CUTANEOUS | Status: AC
  Administered 2014-11-27: 1 via TOPICAL
  Filled 2014-11-27: qty 22

## 2014-11-27 MED ORDER — TAMSULOSIN HCL 0.4 MG PO CAPS
0.4000 mg | ORAL_CAPSULE | Freq: Every day | ORAL | Status: DC
  Administered 2014-11-27: 0.4 mg via ORAL
  Filled 2014-11-27: qty 1

## 2014-11-27 MED ORDER — ADULT MULTIVITAMIN W/MINERALS CH
1.0000 | ORAL_TABLET | Freq: Every day | ORAL | Status: DC
Start: 2014-11-27 — End: 2014-11-27
  Administered 2014-11-27: 1 via ORAL
  Filled 2014-11-27: qty 1

## 2014-11-27 MED ORDER — LIDOCAINE HCL (PF) 1 % IJ SOLN
INTRAMUSCULAR | Status: AC
  Filled 2014-11-27: qty 30

## 2014-11-27 MED ORDER — SODIUM CHLORIDE 0.9 % IR SOLN
80.0000 mg | Status: AC
  Administered 2014-11-27: 80 mg

## 2014-11-27 MED ORDER — CLOPIDOGREL BISULFATE 75 MG PO TABS: 75.0000 mg | ORAL_TABLET | Freq: Every day | ORAL | Status: DC

## 2014-11-27 MED ORDER — CARVEDILOL 6.25 MG PO TABS: 6.2500 mg | ORAL_TABLET | Freq: Two times a day (BID) | ORAL | Status: DC

## 2014-11-27 MED ORDER — CEFAZOLIN SODIUM-DEXTROSE 2-3 GM-% IV SOLR
INTRAVENOUS | Status: DC | PRN
  Administered 2014-11-27: 2 g via INTRAVENOUS

## 2014-11-27 MED ORDER — SODIUM CHLORIDE 0.9 % IV SOLN
INTRAVENOUS | Status: DC
  Administered 2014-11-27: 07:00:00 via INTRAVENOUS

## 2014-11-27 MED ORDER — SODIUM CHLORIDE 0.9 % IR SOLN
Status: AC
  Filled 2014-11-27: qty 2

## 2014-11-27 MED ORDER — ACETAMINOPHEN 325 MG PO TABS: 325.0000 mg | ORAL_TABLET | ORAL | Status: DC | PRN

## 2014-11-27 MED ORDER — CARVEDILOL 6.25 MG PO TABS
6.2500 mg | ORAL_TABLET | Freq: Two times a day (BID) | ORAL | Status: DC
  Administered 2014-11-27: 6.25 mg via ORAL
  Filled 2014-11-27: qty 1

## 2014-11-27 MED ORDER — NITROGLYCERIN 0.4 MG SL SUBL: 0.4000 mg | SUBLINGUAL_TABLET | SUBLINGUAL | Status: DC | PRN

## 2014-11-27 MED ORDER — SPIRONOLACTONE 25 MG PO TABS
25.0000 mg | ORAL_TABLET | Freq: Every day | ORAL | Status: DC
  Administered 2014-11-27: 25 mg via ORAL
  Filled 2014-11-27: qty 1

## 2014-11-27 MED ORDER — MIDAZOLAM HCL 5 MG/5ML IJ SOLN
INTRAMUSCULAR | Status: AC
  Filled 2014-11-27: qty 25

## 2014-11-27 MED ORDER — FENTANYL CITRATE (PF) 100 MCG/2ML IJ SOLN
INTRAMUSCULAR | Status: AC
  Filled 2014-11-27: qty 4

## 2014-11-27 MED ORDER — CEFAZOLIN SODIUM-DEXTROSE 2-3 GM-% IV SOLR
INTRAVENOUS | Status: AC
  Filled 2014-11-27: qty 50

## 2014-11-27 MED ORDER — SODIUM CHLORIDE 0.9 % IJ SOLN
3.0000 mL | Freq: Two times a day (BID) | INTRAMUSCULAR | Status: DC
  Administered 2014-11-27: 3 mL via INTRAVENOUS

## 2014-11-27 MED ORDER — LIDOCAINE HCL (PF) 1 % IJ SOLN
INTRAMUSCULAR | Status: AC
Start: 2014-11-27 — End: 2014-11-27
  Filled 2014-11-27: qty 30

## 2014-11-27 MED ORDER — VITAMIN D (ERGOCALCIFEROL) 1.25 MG (50000 UNIT) PO CAPS: 50000.0000 [IU] | ORAL_CAPSULE | ORAL | Status: DC

## 2014-11-27 MED ORDER — FUROSEMIDE 40 MG PO TABS
40.0000 mg | ORAL_TABLET | Freq: Every day | ORAL | Status: DC
  Administered 2014-11-27: 40 mg via ORAL
  Filled 2014-11-27: qty 1

## 2014-11-27 MED ORDER — HYDROCODONE-ACETAMINOPHEN 5-325 MG PO TABS: 1.0000 | ORAL_TABLET | Freq: Four times a day (QID) | ORAL | Status: DC | PRN

## 2014-11-27 MED ORDER — ALLOPURINOL 100 MG PO TABS: 100.0000 mg | ORAL_TABLET | Freq: Every day | ORAL | Status: DC

## 2014-11-27 MED ORDER — ATORVASTATIN CALCIUM 80 MG PO TABS: 80.0000 mg | ORAL_TABLET | Freq: Every day | ORAL | Status: DC

## 2014-11-27 MED ORDER — HEPARIN (PORCINE) IN NACL 2-0.9 UNIT/ML-% IJ SOLN
INTRAMUSCULAR | Status: AC
  Filled 2014-11-27: qty 500

## 2014-11-27 MED ORDER — SODIUM CHLORIDE 0.9 % IJ SOLN: 3.0000 mL | INTRAMUSCULAR | Status: DC | PRN

## 2014-11-27 MED ORDER — LIDOCAINE HCL (PF) 1 % IJ SOLN
INTRAMUSCULAR | Status: DC | PRN
  Administered 2014-11-27: 51 mL via INTRADERMAL

## 2014-11-27 MED ORDER — CEFAZOLIN SODIUM 1-5 GM-% IV SOLN
1.0000 g | Freq: Four times a day (QID) | INTRAVENOUS | Status: DC
  Administered 2014-11-27: 1 g via INTRAVENOUS
  Filled 2014-11-27 (×3): qty 50

## 2014-11-27 MED ORDER — CEFAZOLIN SODIUM-DEXTROSE 2-3 GM-% IV SOLR: 2.0000 g | INTRAVENOUS | Status: DC

## 2014-11-27 MED ORDER — HYDROCODONE-ACETAMINOPHEN 5-325 MG PO TABS
1.0000 | ORAL_TABLET | ORAL | Status: DC | PRN
  Administered 2014-11-27: 1 via ORAL
  Filled 2014-11-27: qty 1

## 2014-11-27 MED ORDER — PANTOPRAZOLE SODIUM 40 MG PO TBEC
40.0000 mg | DELAYED_RELEASE_TABLET | Freq: Every day | ORAL | Status: DC
  Administered 2014-11-27: 40 mg via ORAL
  Filled 2014-11-27: qty 1

## 2014-11-27 MED ORDER — WARFARIN - PHYSICIAN DOSING INPATIENT: Freq: Every day | Status: DC

## 2014-11-27 MED ORDER — SODIUM CHLORIDE 0.9 % IV SOLN: 250.0000 mL | INTRAVENOUS | Status: DC | PRN

## 2014-11-27 MED ORDER — MUPIROCIN 2 % EX OINT
1.0000 "application " | TOPICAL_OINTMENT | Freq: Once | CUTANEOUS | Status: AC
  Administered 2014-11-27: 1 via TOPICAL
  Filled 2014-11-27: qty 22

## 2014-11-27 SURGICAL SUPPLY — 6 items
CABLE SURGICAL S-101-97-12 (CABLE) ×2 IMPLANT
ICD FORTIFY ASSU VR CD1357-40Q (ICD Generator) ×2 IMPLANT
LEAD DURATA 7122Q-65CM (Lead) ×2 IMPLANT
PAD DEFIB LIFELINK (PAD) ×2 IMPLANT
SHEATH CLASSIC 7F (SHEATH) ×2 IMPLANT
TRAY PACEMAKER INSERTION (CUSTOM PROCEDURE TRAY) ×2 IMPLANT

## 2014-11-27 NOTE — Interval H&P Note (Signed)
History and Physical Interval Note:   ICD Criteria  Current LVEF:25%. Within 12 months prior to implant: Yes   Heart failure history: Yes, Class II  Cardiomyopathy history: Yes, Ischemic Cardiomyopathy.  Atrial Fibrillation/Atrial Flutter: No.  Ventricular tachycardia history: No.  Cardiac arrest history: No.  History of syndromes with risk of sudden death: No.  Previous ICD: No.  Current ICD indication: Primary  PPM indication: No.   Class I or II Bradycardia indication present: No  Beta Blocker therapy for 3 or more months: Yes, prescribed.   Ace Inhibitor/ARB therapy for 3 or more months: Yes, prescribed.      11/27/2014 7:15 AM  Kevin Hart  has presented today for surgery, with the diagnosis of acm  The various methods of treatment have been discussed with the patient and family. After consideration of risks, benefits and other options for treatment, the patient has consented to  Procedure(s): ICD Implant ([name]) as a surgical intervention .  The patient's history has been reviewed, patient examined, no change in status, stable for surgery.  I have reviewed the patient's chart and labs.  Questions were answered to the patient's satisfaction.     Thompson Grayer

## 2014-11-27 NOTE — Progress Notes (Signed)
Doing well post ICD implant DC to home today  CXR without ptx, device interrogation normal Wound care, restrictions reviewed with patient Routine follow up scheduled.  Chanetta Marshall, NP 11/27/2014 4:34 PM    Device interrogation is reviewed and normal. Thompson Grayer MD, [facility]

## 2014-11-27 NOTE — Discharge Instructions (Signed)
° ° °  Supplemental Discharge Instructions for  Pacemaker/Defibrillator Patients  Activity No heavy lifting or vigorous activity with your left/right arm for 6 to 8 weeks.  Do not raise your left/right arm above your head for one week.  Gradually raise your affected arm as drawn below.           __          12/01/14                    12/02/14                12/03/14                  12/04/14  NO DRIVING for  1 week   ; you may begin driving on   07/23/75  .  WOUND CARE - Keep the wound area clean and dry.  Do not get this area wet for one week. No showers for one week; you may shower on  12/04/14 - The tape/steri-strips on your wound will fall off; do not pull them off.  No bandage is needed on the site.  DO  NOT apply any creams, oils, or ointments to the wound area. - If you notice any drainage or discharge from the wound, any swelling or bruising at the site, or you develop a fever > 101? F after you are discharged home, call the office at once.  Special Instructions - You are still able to use cellular telephones; use the ear opposite the side where you have your pacemaker/defibrillator.  Avoid carrying your cellular phone near your device. - When traveling through airports, show security personnel your identification card to avoid being screened in the metal detectors.  Ask the security personnel to use the hand wand. - Avoid arc welding equipment, MRI testing (magnetic resonance imaging), TENS units (transcutaneous nerve stimulators).  Call the office for questions about other devices. - Avoid electrical appliances that are in poor condition or are not properly grounded. - Microwave ovens are safe to be near or to operate.  Additional information for defibrillator patients should your device go off: - If your device goes off ONCE and you feel fine afterward, notify the device clinic nurses. - If your device goes off ONCE and you do not feel well afterward, call 911. - If your device goes  off TWICE, call 911. - If your device goes off THREE times in one day, call 911.  DO NOT DRIVE YOURSELF OR A FAMILY MEMBER WITH A DEFIBRILLATOR TO THE HOSPITAL--CALL 911.

## 2014-11-27 NOTE — H&P (View-Only) (Signed)
 Electrophysiology Office Note   Date:  11/05/2014   ID:  Kevin Hart, DOB 02/12/1946, MRN [id number]  PCP:  ARVIND,MOOGALI M, MD  Cardiologist:  Dr Jordan Primary Electrophysiologist: Woodrow Drab, MD    Chief Complaint  Patient presents with  . At risk for sudden cardiac death     History of Present Illness: Kevin Hart is a 69 y.o. male who presents today for electrophysiology evaluation.   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  He presented 07/04/2014 with non-STEMI and was found to have 90% mid LAD stenosis within the stent. EF was 25-30%.  He underwent PCI.  He reports very poor memory which he attributed to versed lasting several months.  He was fitted with a Lifevest. Following his cath procedure he had difficulty with memory loss and word finding. This has resolved.  He is now participating in [facility].. Titration of medications have been limited by orthostasis but he is tolerating current meds.  Repeat Echo 10/02/14 showed EF still 25-30%.  Today, he denies symptoms of palpitations, chest pain, shortness of breath, orthopnea, PND, lower extremity edema, claudication, dizziness, presyncope, syncope, bleeding, or neurologic sequela. The patient is tolerating medications without difficulties and is otherwise without complaint today.    Past Medical History  Diagnosis Date  . CAD (coronary artery disease)     a. prior LAD stenting;  b. 06/2014 NSTEMI/PCI: LM nl, LAD 30p, 90m ISR (3.0x20 Promus DES), LCX nl, RCA nondom, nl, EF 25-30% ant, apical, dist inf AK.  . DVT (deep venous thrombosis)     RECURRENT left leg  . Dyslipidemia   . GERD (gastroesophageal reflux disease)   . Anterior myocardial infarction   . Lupus anticoagulant positive     on coumadin  . S/P drug eluting coronary stent placement, 07/06/14, Promus  07/07/2014  . At risk for sudden cardiac death, EF 25-30% with PVCs 07/07/2014  . Ischemic cardiomyopathy 07/06/14    EF 25-30%  . PVC's (premature ventricular contractions)   . Rotator cuff tear     R side  . Gout   . BPH (benign prostatic hyperplasia)    Past Surgical History  Procedure Laterality Date  . Kidney stone    . Coronary angioplasty    . Left heart catheterization with coronary angiogram [name] 07/06/2014    Procedure: LEFT HEART CATHETERIZATION WITH CORONARY ANGIOGRAM;  Surgeon: Peter M Jordan, MD;  Location: [facility] CATH [facility];  Service: Cardiovascular;  Laterality: [name];  . Percutaneous coronary stent intervention (pci-s) Right 07/06/2014    Procedure: PERCUTANEOUS CORONARY STENT INTERVENTION (PCI-S);  Surgeon: Peter M Jordan, MD;  Location: [facility] CATH LAB;  Service: Cardiovascular;  Laterality: Right;     Current Outpatient Prescriptions  Medication Sig Dispense Refill  . allopurinol (ZYLOPRIM) 100 MG tablet Take 100 mg by mouth daily with breakfast.     . atorvastatin (LIPITOR) 80 MG tablet Take 1 tablet (80 mg total) by mouth daily at 6 PM. 30 tablet 6  . carvedilol (COREG) 6.25 MG tablet Take 1 tablet (6.25 mg total) by mouth 2 (two) times daily. 90 tablet 6  . clopidogrel (PLAVIX) 75 MG tablet Take 1 tablet (75 mg total) by mouth daily with breakfast. 30 tablet 6  . furosemide (LASIX) 40 MG tablet Take 40 mg by mouth daily.      . lisinopril (  PRINIVIL,ZESTRIL) 2.5 MG tablet Take 1 tablet (2.5 mg total) by mouth daily. 30 tablet 6  . Multiple Vitamin (MULTI-VITAMINS) TABS Take 1 tablet by mouth daily at 12 noon.    . nitroGLYCERIN (NITROSTAT) 0.4 MG SL tablet Place 1 tablet (0.4 mg total) under the tongue every 5 (five) minutes x 3 doses as needed for chest pain. 25 tablet 11  . pantoprazole (PROTONIX) 40 MG tablet Take 1 tablet (40 mg total) by mouth daily. 30 tablet 2  . spironolactone (ALDACTONE) 25 MG tablet Take 1 tablet (25 mg total) by mouth daily. 30 tablet 6  . Tamsulosin  HCl (FLOMAX) 0.4 MG CAPS Take 0.4 mg by mouth daily.     . Vitamin D, Ergocalciferol, (DRISDOL) 50000 UNITS CAPS capsule Take 1 capsule by mouth once a week.     . warfarin (COUMADIN) 5 MG tablet Take 5 mg by mouth as directed. Do not take on Thursdays     No current facility-administered medications for this visit.    Allergies:   Review of patient's allergies indicates no known allergies.   Social History:  The patient  reports that he has never smoked. He does not have any smokeless tobacco history on file. He reports that he does not drink alcohol or use illicit drugs.   Family History:  The patient's  family history includes Aneurysm in his father.    ROS:  Please see the history of present illness.   All other systems are reviewed and negative.    PHYSICAL EXAM: VS:  BP 104/68 mmHg  Pulse 76  Ht 6' (1.829 m)  Wt 105.688 kg (233 lb)  BMI 31.59 kg/m2 , BMI Body mass index is 31.59 kg/(m^2). GEN: Well nourished, well developed, in no acute distress HEENT: normal Neck: no JVD, carotid bruits, or masses Cardiac: RRR; no murmurs, rubs, or gallops,no edema  Respiratory:  clear to auscultation bilaterally, normal work of breathing GI: soft, nontender, nondistended, + BS MS: no deformity or atrophy Skin: warm and dry  Neuro:  Strength and sensation are intact Psych: euthymic mood, full affect  EKG:  EKG is ordered today. The ekg ordered today shows sinus rhythm 76 bpm, PR 202, LAA, anterior infarction, QRS 90 msec, Qtc 438   Recent Labs: 07/05/2014: Magnesium 1.9 07/07/2014: BUN 11; Creatinine, Ser 1.05; Hemoglobin 14.6; Platelets 171; Potassium 4.0; Sodium 139 07/25/2014: ALT 25    Lipid Panel     Component Value Date/Time   CHOL 113 07/25/2014 1029   TRIG 122.0 07/25/2014 1029   HDL 31.90* 07/25/2014 1029   CHOLHDL 4 07/25/2014 1029   VLDL 24.4 07/25/2014 1029   LDLCALC 57 07/25/2014 1029     Wt Readings from Last 3 Encounters:  11/05/14 105.688 kg (233 lb)    10/09/14 106.142 kg (234 lb)  08/02/14 109.135 kg (240 lb 9.6 oz)      Other studies Reviewed: Additional studies/ records that were reviewed today include: echo, Dr Jordans notes  Review of the above records today demonstrates: EF 25%   ASSESSMENT AND PLAN:  1.  Ischemic CM/ CAD The patient has an ischemic CM (EF 25%), NYHA Class II CHF, and CAD. At this time, he meets MADIT II/ SCD-HeFT criteria for ICD implantation for primary prevention of sudden death. QRS <130 msec.  He therefore does not meet criteria for CRT. Risks, benefits, alternatives to ICD implantation were discussed in detail with the patient today. The patient  understands that the risks include but are not limited   to bleeding, infection, pneumothorax, perforation, tamponade, vascular damage, renal failure, MI, stroke, death, inappropriate shocks, and lead dislodgement and wishes to proceed.  We will therefore schedule device implantation at the next available time.  Current medicines are reviewed at length with the patient today.   The patient does not have concerns regarding his medicines.  The following changes were made today:  none    Signed, Urania Pearlman, MD  11/05/2014 8:53 AM     [facility] [address] [place] [address] [phone] (office) [phone] (fax)  

## 2014-11-28 MED FILL — Sodium Chloride Irrigation Soln 0.9%: Qty: 500 | Status: AC

## 2014-11-28 MED FILL — Gentamicin Sulfate Inj 40 MG/ML: INTRAMUSCULAR | Qty: 2 | Status: AC

## 2014-11-28 MED FILL — Heparin Sodium (Porcine) 2 Unit/ML in Sodium Chloride 0.9%: INTRAMUSCULAR | Qty: 500 | Status: AC

## 2014-12-13 ENCOUNTER — Ambulatory Visit (INDEPENDENT_AMBULATORY_CARE_PROVIDER_SITE_OTHER): Payer: Medicare Other | Admitting: *Deleted

## 2014-12-13 DIAGNOSIS — I519 Heart disease, unspecified: Secondary | ICD-10-CM | POA: Diagnosis not present

## 2014-12-13 DIAGNOSIS — I255 Ischemic cardiomyopathy: Secondary | ICD-10-CM | POA: Diagnosis not present

## 2014-12-13 LAB — CUP PACEART INCLINIC DEVICE CHECK
Battery Remaining Longevity: 103.2 mo
Date Time Interrogation Session: 20160922094251
HighPow Impedance: 69 Ohm
Lead Channel Impedance Value: 462.5 Ohm
Lead Channel Pacing Threshold Pulse Width: 0.5 ms
Lead Channel Sensing Intrinsic Amplitude: 11.8 mV
MDC IDC MSMT LEADCHNL RV PACING THRESHOLD AMPLITUDE: 0.5 V
MDC IDC MSMT LEADCHNL RV PACING THRESHOLD AMPLITUDE: 0.5 V
MDC IDC MSMT LEADCHNL RV PACING THRESHOLD PULSEWIDTH: 0.5 ms
MDC IDC SET LEADCHNL RV PACING AMPLITUDE: 3.5 V
MDC IDC SET LEADCHNL RV PACING PULSEWIDTH: 0.5 ms
MDC IDC SET LEADCHNL RV SENSING SENSITIVITY: 0.5 mV
MDC IDC SET ZONE DETECTION INTERVAL: 250 ms
MDC IDC STAT BRADY RV PERCENT PACED: 0.52 %
Pulse Gen Serial Number: 7257239
Zone Setting Detection Interval: 300 ms
Zone Setting Detection Interval: 340 ms

## 2014-12-13 NOTE — Progress Notes (Signed)
Wound check appointment. Steri-strips removed. Wound without redness or edema. Incision edges approximated, wound well healed. Normal device function. Thresholds, sensing, and impedances consistent with implant measurements. Device programmed at 3.5V for extra safety margin until 3 month visit. Histogram distribution appropriate for patient and level of activity. No mode switches or ventricular arrhythmias noted. Patient educated about wound care, arm mobility, lifting restrictions, shock plan. Merlin connected. ROV with JA 02/06/15 at 9:30.

## 2014-12-31 ENCOUNTER — Encounter: Payer: Self-pay | Admitting: Internal Medicine

## 2014-12-31 ENCOUNTER — Encounter: Payer: Self-pay | Admitting: Cardiology

## 2014-12-31 ENCOUNTER — Ambulatory Visit (INDEPENDENT_AMBULATORY_CARE_PROVIDER_SITE_OTHER): Payer: Medicare Other | Admitting: Cardiology

## 2014-12-31 VITALS — BP 104/56 | HR 84 | Ht 72.0 in | Wt 235.1 lb

## 2014-12-31 DIAGNOSIS — I2510 Atherosclerotic heart disease of native coronary artery without angina pectoris: Secondary | ICD-10-CM

## 2014-12-31 DIAGNOSIS — I5022 Chronic systolic (congestive) heart failure: Secondary | ICD-10-CM

## 2014-12-31 DIAGNOSIS — Z955 Presence of coronary angioplasty implant and graft: Secondary | ICD-10-CM | POA: Diagnosis not present

## 2014-12-31 DIAGNOSIS — Z86718 Personal history of other venous thrombosis and embolism: Secondary | ICD-10-CM | POA: Diagnosis not present

## 2014-12-31 DIAGNOSIS — I2583 Coronary atherosclerosis due to lipid rich plaque: Principal | ICD-10-CM

## 2014-12-31 DIAGNOSIS — I255 Ischemic cardiomyopathy: Secondary | ICD-10-CM

## 2014-12-31 HISTORY — DX: Chronic systolic (congestive) heart failure: I50.22

## 2014-12-31 NOTE — Patient Instructions (Signed)
Continue your current therapy  I will see you in 6 months.   

## 2014-12-31 NOTE — Progress Notes (Signed)
Cardiology Office Note   Date:  12/31/2014   ID:  Melo, Stauber [birth year], MRN [id number]  PCP:  Guadlupe Spanish, MD  Cardiologist:  Dr Martinique  Sirenity Shew Martinique, MD   Chief Complaint  Patient presents with  . Follow-up    patient reports no new symptons since last visit.    History of Present Illness: Kevin Hart is a 69 y.o. male with a history of MI s/p PCI over 10 years ago, and history of multiple DVTs on coumadin- (lupus anticoagulant positive), who was admitted in  06/2014 after being admitted for non-STEMI and getting a  DES stent to his LAD.  Cath showed 90% mid LAD stenosis within an old stent. EF was 25-30%.  Titration of medications have been limited by orthostasis but he is tolerating current meds. He denies any chest pain or SOB. Energy is good. Repeat Echo 10/02/14 showed EF still 25-30%. He underwent ICD implant on 11/27/14 by Dr. Rayann Heman. He tolerated this procedure well without complications. He is ready to resume [facility].    Past Medical History  Diagnosis Date  . CAD (coronary artery disease)     a. prior LAD stenting;  b. 06/2014 NSTEMI/PCI: LM nl, LAD 30p, 38m ISR (3.0x20 Promus DES), LCX nl, RCA nondom, nl, EF 25-30% ant, apical, dist inf AK.  Marland Kitchen DVT (deep venous thrombosis) ([facility])     RECURRENT left leg  . Dyslipidemia   . GERD (gastroesophageal reflux disease)   . Anterior myocardial infarction ([place])   . Lupus anticoagulant positive     on coumadin  . S/P drug eluting coronary stent placement, 07/06/14, Promus 07/28/2014  . At risk for sudden cardiac death, EF 25-30% with PVCs Jul 28, 2014  . Ischemic cardiomyopathy 07/06/14    EF 25-30%  . PVC's (premature ventricular contractions)   . Rotator cuff tear     R side  . Gout   . BPH (benign prostatic hyperplasia)   . Subarachnoid bleed ([place]) 2015    after falling and hitting his head  . Chronic systolic CHF (congestive heart failure) ([place]) 12/31/2014    Past Surgical History  Procedure  Laterality Date  . Kidney stone    . Coronary angioplasty    . Left heart catheterization with coronary angiogram [name] 07/06/2014    Procedure: LEFT HEART CATHETERIZATION WITH CORONARY ANGIOGRAM;  Surgeon: Brendaly Townsel M Martinique, MD;  Location: [facility] CATH LAB;  Service: Cardiovascular;  Laterality: [name];  . Percutaneous coronary stent intervention (pci-s) Right 07/06/2014    Procedure: PERCUTANEOUS CORONARY STENT INTERVENTION (PCI-S);  Surgeon: Dreshon Proffit M Martinique, MD;  Location: [facility] CATH LAB;  Service: Cardiovascular;  Laterality: Right;  . Ep implantable device [name] 11/27/2014    Procedure: ICD Implant;  Surgeon: Thompson Grayer, MD;  Location: [facility] CV LAB;  Service: Cardiovascular;  Laterality: [name];    Current Outpatient Prescriptions  Medication Sig Dispense Refill  . allopurinol (ZYLOPRIM) 100 MG tablet Take 100 mg by mouth daily with breakfast.     . atorvastatin (LIPITOR) 80 MG tablet Take 1 tablet (80 mg total) by mouth daily at 6 PM. 30 tablet 6  . carvedilol (COREG) 6.25 MG tablet Take 1 tablet (6.25 mg total) by mouth 2 (two) times daily. 90 tablet 6  . clopidogrel (PLAVIX) 75 MG tablet Take 1 tablet (75 mg total) by mouth daily with breakfast. 30 tablet 6  . furosemide (LASIX) 40 MG tablet Take 40 mg by mouth daily.      Marland Kitchen  HYDROcodone-acetaminophen (NORCO) 5-325 MG per tablet Take 1 tablet by mouth every 6 (six) hours as needed for moderate pain. 10 tablet 0  . lisinopril (PRINIVIL,ZESTRIL) 2.5 MG tablet Take 1 tablet (2.5 mg total) by mouth daily. 30 tablet 6  . Multiple Vitamin (MULTI-VITAMINS) TABS Take 1 tablet by mouth daily at 12 noon.    . nitroGLYCERIN (NITROSTAT) 0.4 MG SL tablet Place 1 tablet (0.4 mg total) under the tongue every 5 (five) minutes x 3 doses as needed for chest pain. 25 tablet 11  . pantoprazole (PROTONIX) 40 MG tablet Take 1 tablet (40 mg total) by mouth daily. 30 tablet 2  . spironolactone (ALDACTONE) 25 MG tablet Take 1 tablet (25 mg total) by mouth daily. 30 tablet 6  .  Tamsulosin HCl (FLOMAX) 0.4 MG CAPS Take 0.4 mg by mouth daily.     . Vitamin D, Ergocalciferol, (DRISDOL) 50000 UNITS CAPS capsule Take 1 capsule by mouth once a week.     . warfarin (COUMADIN) 5 MG tablet Take 5 mg by mouth as directed. Do not take on Thursdays     No current facility-administered medications for this visit.    Allergies:   Review of patient's allergies indicates no known allergies.    Social History:  The patient  reports that he has never smoked. He does not have any smokeless tobacco history on file. He reports that he does not drink alcohol or use illicit drugs.   Family History:  The patient's family history includes Aneurysm in his father.    ROS:  Please see the history of present illness. All other systems are reviewed and negative.    PHYSICAL EXAM: VS:  BP 104/56 mmHg  Pulse 84  Ht 6' (1.829 m)  Wt 106.641 kg (235 lb 1.6 oz)  BMI 31.88 kg/m2 , BMI Body mass index is 31.88 kg/(m^2). GEN: Well nourished, well developed, in no acute distress HEENT: normal Neck: no JVD, carotid bruits, or masses Cardiac: RRR; no murmurs, rubs, or gallops,no edema;  ICD in left subclavicular area has healed well. Respiratory:  clear to auscultation bilaterally GI: soft, nontender, nondistended, + BS MS: no deformity or atrophy Skin: warm and dry, no rash Ext: hematoma left calf. No redness.  Neuro:  Strength and sensation are intact Psych: euthymic mood, full affect   EKG:  EKG is not  ordered today.    Recent Labs: 07/05/2014: Magnesium 1.9 07/25/2014: ALT 25 11/14/2014: BUN 23; Creatinine, Ser 1.17; Hemoglobin 13.8; Platelets 223.0; Potassium 3.8; Sodium 137    Lipid Panel    Component Value Date/Time   CHOL 113 07/25/2014 1029   TRIG 122.0 07/25/2014 1029   HDL 31.90* 07/25/2014 1029   CHOLHDL 4 07/25/2014 1029   VLDL 24.4 07/25/2014 1029   LDLCALC 57 07/25/2014 1029     Wt Readings from Last 3 Encounters:  12/31/14 106.641 kg (235 lb 1.6 oz)    11/27/14 106.4 kg (234 lb 9.1 oz)  11/05/14 105.688 kg (233 lb)     Other studies Reviewed: Echo 10/02/14:Study Conclusions  - Left ventricle: The cavity size was normal. Wall thickness was normal. Systolic function was severely reduced. The estimated ejection fraction was in the range of 25% to 30%. There is akinesis of the anteroseptal and apical myocardium. Doppler parameters are consistent with abnormal left ventricular relaxation (grade 1 diastolic dysfunction). - Aortic valve: There was mild regurgitation. - Aortic root: The aortic root was mildly dilated. - Left atrium: The atrium was mildly dilated. -  Right atrium: The atrium was mildly dilated.  Impressions:  - Akinesis of the anteroseptal wall and apex with overall severely reduced LV function; grade 1 diastolic dysfunction; mild AI; mlld LAE and RAE.   ASSESSMENT AND PLAN:  1.  CAD: s/p DES of mid LAD for instent restenosis in April 2016.  He is asymptomatic. Continue Coumadin and Plavix. Target INR 2.5.  2.  Chronic systolic CHF. EF 25-30%- persistent. Continue medical management. He is s/p prophylactic ICD. Previously orthostatic hypotension limited further titration of meds.   3.  Hyperlipidemia now on high dose lipitor. Excellent control.   5. History of recurrent DVT with positive lupus anticoagulant. On long term Coumadin. INR followed by primary care.   Current medicines are reviewed at length with the patient today.  The patient does not have concerns regarding medicines.  The following changes have been made:  None  Labs/ tests ordered today include:  No orders of the defined types were placed in this encounter.   Disposition:   FU with Dr Martinique in 6 months  Signed, Jennesis Ramaswamy Martinique, MD, [facility]  12/31/2014 9:24 AM    [address], [place], [place]  [address] Phone: [phone]; Fax: [phone]

## 2015-01-04 ENCOUNTER — Other Ambulatory Visit: Payer: Self-pay | Admitting: Cardiology

## 2015-02-01 ENCOUNTER — Other Ambulatory Visit: Payer: Self-pay | Admitting: *Deleted

## 2015-02-01 MED ORDER — ATORVASTATIN CALCIUM 80 MG PO TABS: 80.0000 mg | ORAL_TABLET | Freq: Every day | ORAL | Status: DC

## 2015-02-06 ENCOUNTER — Encounter: Payer: Medicare Other | Admitting: Internal Medicine

## 2015-02-25 ENCOUNTER — Encounter: Payer: Medicare Other | Admitting: Internal Medicine

## 2015-03-06 ENCOUNTER — Other Ambulatory Visit: Payer: Self-pay | Admitting: Cardiology

## 2015-03-06 NOTE — Telephone Encounter (Signed)
Rx has been sent to the pharmacy electronically. ° °

## 2015-03-12 ENCOUNTER — Other Ambulatory Visit: Payer: Self-pay

## 2015-03-12 MED ORDER — SPIRONOLACTONE 25 MG PO TABS: 25.0000 mg | ORAL_TABLET | Freq: Every day | ORAL | Status: DC

## 2015-03-12 MED ORDER — LISINOPRIL 2.5 MG PO TABS: 2.5000 mg | ORAL_TABLET | Freq: Every day | ORAL | Status: DC

## 2015-03-15 ENCOUNTER — Encounter (HOSPITAL_COMMUNITY): Payer: Self-pay | Admitting: *Deleted

## 2015-03-15 ENCOUNTER — Emergency Department (HOSPITAL_COMMUNITY): Payer: Medicare Other

## 2015-03-15 ENCOUNTER — Emergency Department (HOSPITAL_COMMUNITY)
Admission: EM | Admit: 2015-03-15 | Discharge: 2015-03-16 | Disposition: A | Discharge status: Home / Self Care | Payer: Medicare Other | Attending: Emergency Medicine | Admitting: Emergency Medicine

## 2015-03-15 DIAGNOSIS — Z7901 Long term (current) use of anticoagulants: Secondary | ICD-10-CM | POA: Insufficient documentation

## 2015-03-15 DIAGNOSIS — E785 Hyperlipidemia, unspecified: Secondary | ICD-10-CM | POA: Diagnosis present

## 2015-03-15 DIAGNOSIS — I252 Old myocardial infarction: Secondary | ICD-10-CM | POA: Insufficient documentation

## 2015-03-15 DIAGNOSIS — M109 Gout, unspecified: Secondary | ICD-10-CM | POA: Diagnosis not present

## 2015-03-15 DIAGNOSIS — I255 Ischemic cardiomyopathy: Secondary | ICD-10-CM | POA: Diagnosis not present

## 2015-03-15 DIAGNOSIS — Z4502 Encounter for adjustment and management of automatic implantable cardiac defibrillator: Secondary | ICD-10-CM | POA: Insufficient documentation

## 2015-03-15 DIAGNOSIS — I5022 Chronic systolic (congestive) heart failure: Secondary | ICD-10-CM | POA: Insufficient documentation

## 2015-03-15 DIAGNOSIS — Z95810 Presence of automatic (implantable) cardiac defibrillator: Secondary | ICD-10-CM | POA: Diagnosis not present

## 2015-03-15 DIAGNOSIS — Z79899 Other long term (current) drug therapy: Secondary | ICD-10-CM | POA: Diagnosis not present

## 2015-03-15 DIAGNOSIS — Z86718 Personal history of other venous thrombosis and embolism: Secondary | ICD-10-CM | POA: Diagnosis not present

## 2015-03-15 DIAGNOSIS — Z7902 Long term (current) use of antithrombotics/antiplatelets: Secondary | ICD-10-CM | POA: Diagnosis not present

## 2015-03-15 DIAGNOSIS — I2510 Atherosclerotic heart disease of native coronary artery without angina pectoris: Secondary | ICD-10-CM | POA: Diagnosis not present

## 2015-03-15 DIAGNOSIS — N400 Enlarged prostate without lower urinary tract symptoms: Secondary | ICD-10-CM | POA: Diagnosis not present

## 2015-03-15 DIAGNOSIS — K219 Gastro-esophageal reflux disease without esophagitis: Secondary | ICD-10-CM | POA: Insufficient documentation

## 2015-03-15 DIAGNOSIS — Z9889 Other specified postprocedural states: Secondary | ICD-10-CM | POA: Insufficient documentation

## 2015-03-15 NOTE — ED Provider Notes (Signed)
CSN: [id number]     Arrival date & time 03/15/15  2303 History  By signing my name below, I, Evelene Croon, attest that this documentation has been prepared under the direction and in the presence of Everlene Balls, MD . Electronically Signed: Evelene Croon, Scribe. 03/15/2015. 11:29 PM.   Chief Complaint  Patient presents with  . AICD Problem    The history is provided by the patient. No language interpreter was used.   HPI Comments:  Kevin Hart is a 69 y.o. male with a history of CAD, PVCs,  and CHF, who presents to the Emergency Department complaining of intermittent vibrations from AICD x 1 day. He states each episode lasted ~ 2-3 seconds. He denies firing of device and notes vibrations has resolved. Pt states the AICD was placed on Sept 1, 2016; no firings since placement. Pt spoke with on-call cardiologist Rosanna Randy) this evening who told pt it may be a personal notifier; advised to come to ED to have it checked. Pt denies SOB, vomiting, CP, diaphoresis, and increased swelling in his BLE. He is currently on coumadin.  [facility]red- Cardiologist  Past Medical History  Diagnosis Date  . CAD (coronary artery disease)     a. prior LAD stenting;  b. 06/2014 NSTEMI/PCI: LM nl, LAD 30p, 51m ISR (3.0x20 Promus DES), LCX nl, RCA nondom, nl, EF 25-30% ant, apical, dist inf [place].  Marland Kitchen DVT (deep venous thrombosis) (HCC)     RECURRENT left leg  . Dyslipidemia   . GERD (gastroesophageal reflux disease)   . Anterior myocardial infarction ([place])   . Lupus anticoagulant positive     on coumadin  . S/P drug eluting coronary stent placement, 07/06/14, Promus Jul 26, 2014  . At risk for sudden cardiac death, EF 25-30% with PVCs 2014/07/26  . Ischemic cardiomyopathy 07/06/14    EF 25-30%  . PVC's (premature ventricular contractions)   . Rotator cuff tear     R side  . Gout   . BPH (benign prostatic hyperplasia)   . Subarachnoid bleed ([place]) 2015    after falling and hitting his head  . Chronic systolic CHF  (congestive heart failure) ([place]) 12/31/2014   Past Surgical History  Procedure Laterality Date  . Kidney stone    . Coronary angioplasty    . Left heart catheterization with coronary angiogram [name] 07/06/2014    Procedure: LEFT HEART CATHETERIZATION WITH CORONARY ANGIOGRAM;  Surgeon: Peter M Martinique, MD;  Location: [facility] CATH LAB;  Service: Cardiovascular;  Laterality: [name];  . Percutaneous coronary stent intervention (pci-s) Right 07/06/2014    Procedure: PERCUTANEOUS CORONARY STENT INTERVENTION (PCI-S);  Surgeon: Peter M Martinique, MD;  Location: [facility] CATH LAB;  Service: Cardiovascular;  Laterality: Right;  . Ep implantable device [name] 11/27/2014    Procedure: ICD Implant;  Surgeon: Thompson Grayer, MD;  Location: [facility] CV LAB;  Service: Cardiovascular;  Laterality: [name];   Family History  Problem Relation Age of Onset  . Aneurysm Father    Social History  Substance Use Topics  . Smoking status: Never Smoker   . Smokeless tobacco: None  . Alcohol Use: No    Review of Systems 10 systems reviewed and all are negative for acute change except as noted in the HPI   Allergies  Review of patient's allergies indicates no known allergies.  Home Medications   Prior to Admission medications   Medication Sig Start Date End Date Taking? Authorizing Provider  allopurinol (ZYLOPRIM) 100 MG tablet Take 100 mg by mouth daily with  breakfast.  06/04/14   Historical Provider, MD  atorvastatin (LIPITOR) 80 MG tablet TAKE ONE (1) TABLET BY MOUTH EVERY DAY AT [facility] 03/06/15   Peter M Martinique, MD  carvedilol (COREG) 6.25 MG tablet Take 1 tablet (6.25 mg total) by mouth 2 (two) times daily. 08/02/14   Peter M Martinique, MD  clopidogrel (PLAVIX) 75 MG tablet TAKE ONE (1) TABLET EACH DAY WITH BREAKFAST 01/04/15   Peter M Martinique, MD  furosemide (LASIX) 40 MG tablet Take 40 mg by mouth daily.      Historical Provider, MD  HYDROcodone-acetaminophen (NORCO) 5-325 MG per tablet Take 1 tablet by mouth every 6 (six) hours as  needed for moderate pain. 11/27/14   Amber Sena Slate, NP  lisinopril (PRINIVIL,ZESTRIL) 2.5 MG tablet Take 1 tablet (2.5 mg total) by mouth daily. 03/12/15   Peter M Martinique, MD  Multiple Vitamin (MULTI-VITAMINS) TABS Take 1 tablet by mouth daily at 12 noon.    Historical Provider, MD  nitroGLYCERIN (NITROSTAT) 0.4 MG SL tablet Place 1 tablet (0.4 mg total) under the tongue every 5 (five) minutes x 3 doses as needed for chest pain. 10/09/14   Peter M Martinique, MD  pantoprazole (PROTONIX) 40 MG tablet Take 1 tablet (40 mg total) by mouth daily. 07/07/14   Lendon Colonel, NP  spironolactone (ALDACTONE) 25 MG tablet Take 1 tablet (25 mg total) by mouth daily. 03/12/15   Peter M Martinique, MD  Tamsulosin HCl (FLOMAX) 0.4 MG CAPS Take 0.4 mg by mouth daily.     Historical Provider, MD  Vitamin D, Ergocalciferol, (DRISDOL) 50000 UNITS CAPS capsule Take 1 capsule by mouth once a week.     Historical Provider, MD  warfarin (COUMADIN) 5 MG tablet Take 5 mg by mouth as directed. Do not take on Thursdays    Historical Provider, MD   BP 112/75 mmHg  Pulse 71  Temp(Src) 98.1 F (36.7 C) (Oral)  Resp 21  SpO2 97% Physical Exam  Constitutional: He is oriented to person, place, and time. Vital signs are normal. He appears well-developed and well-nourished.  Non-toxic appearance. He does not appear ill. No distress.  HENT:  Head: Normocephalic and atraumatic.  Nose: Nose normal.  Mouth/Throat: Oropharynx is clear and moist. No oropharyngeal exudate.  Eyes: Conjunctivae and EOM are normal. Pupils are equal, round, and reactive to light. No scleral icterus.  Neck: Normal range of motion. Neck supple. No tracheal deviation, no edema, no erythema and normal range of motion present. No thyroid mass and no thyromegaly present.  Cardiovascular: Normal rate, regular rhythm, S1 normal, S2 normal, normal heart sounds, intact distal pulses and normal pulses.  Exam reveals no gallop and no friction rub.   No murmur  heard. Pulmonary/Chest: Effort normal and breath sounds normal. No respiratory distress. He has no wheezes. He has no rhonchi. He has no rales.  AICD left chest wall   Abdominal: Soft. Normal appearance and bowel sounds are normal. He exhibits no distension, no ascites and no mass. There is no hepatosplenomegaly. There is no tenderness. There is no rebound, no guarding and no CVA tenderness.  Musculoskeletal: Normal range of motion. He exhibits no edema or tenderness.  Lymphadenopathy:    He has no cervical adenopathy.  Neurological: He is alert and oriented to person, place, and time. He has normal strength. No cranial nerve deficit or sensory deficit.  Skin: Skin is warm, dry and intact. No petechiae and no rash noted. He is not diaphoretic. No erythema. No pallor.  Psychiatric: He has a normal mood and affect. His behavior is normal. Judgment normal.  Nursing note and vitals reviewed.   ED Course  Procedures   DIAGNOSTIC STUDIES:  Oxygen Saturation is 96% on RA, normal by my interpretation.    COORDINATION OF CARE:  11:19 PM Discussed treatment plan with pt at bedside and pt agreed to plan.  Labs Review Labs Reviewed  BASIC METABOLIC PANEL - Abnormal; Notable for the following:    Glucose, Bld 120 (*)    BUN 31 (*)    Creatinine, Ser 1.26 (*)    GFR calc non Af Amer 56 (*)    All other components within normal limits  PROTIME-INR - Abnormal; Notable for the following:    Prothrombin Time 24.0 (*)    INR 2.17 (*)    All other components within normal limits  BRAIN NATRIURETIC PEPTIDE - Abnormal; Notable for the following:    B Natriuretic Peptide 166.0 (*)    All other components within normal limits  CBC WITH DIFFERENTIAL/PLATELET  MAGNESIUM  I-STAT TROPOININ, ED    Imaging Review Dg Chest 2 View  03/15/2015  CLINICAL DATA:  AICD firing, acute onset.  Initial encounter. EXAM: CHEST  2 VIEW COMPARISON:  Chest radiograph performed 11/27/2014 FINDINGS: The lungs are  well-aerated and clear. There is no evidence of focal opacification, pleural effusion or pneumothorax. The heart is normal in size; a AICD is noted ending at the right ventricle. No acute osseous abnormalities are seen. IMPRESSION: No acute cardiopulmonary process seen. Electronically Signed   By: Garald Balding M.D.   On: 03/15/2015 23:53   I have personally reviewed and evaluated these images and lab results as part of my medical decision-making.   EKG Interpretation   Date/Time:  Friday March 15 2015 23:12:46 EST Ventricular Rate:  79 PR Interval:  202 QRS Duration: 86 QT Interval:  400 QTC Calculation: 458 R Axis:   10 Text Interpretation:  Normal sinus rhythm with 1st degree A-V block Poor R  wave progression TWI in lateral leads now resolved Confirmed by Glynn Octave [phone]) on 03/15/2015 11:33:00 PM      MDM   Final diagnoses:  None    Patient presents to the ED for AICD "vibrations" he denies any firing.  He was advised by on call cardiologist to come in for evaluation.  He denies any other symptoms.  Labs, cxr, and AICD interrogation have been ordered.  Will place a page to cardiology on call.  53am - I spoke with Dr. Rosanna Randy who will evaluate the patient in the ED.  [employer] rep called and stated that no shocks or arrhythmias were detected.  No vibrations or warnings were detected either.  CXR and EKG are unremarkable.    [employer] representative has come in to evaluate the patient as well. She tried to reproduce his symptoms by activating the pacemaker and defibrillator however he states the symptoms were not consistent with what he expands earlier. I do not believe the patient he felt are coming from a cardiac cause. He is advised to see Dr. Rayann Heman within one week for close follow-up. He has had no reccurrence of his symptoms in the ED tonight.  He appears well and in no acute distress, vital signs were within his normal limits and he is safe for  discharge.   I personally performed the services described in this documentation, which was scribed in my presence. The recorded information has been reviewed and  is accurate.      Everlene Balls, MD 03/16/15 [phone]

## 2015-03-15 NOTE — ED Notes (Signed)
Patient transported to X-ray 

## 2015-03-15 NOTE — ED Notes (Signed)
The pts aicd has been vibrating intermittently all day.  No chest pain no difficulty.  He had his aicd placed in September this year

## 2015-03-15 NOTE — ED Notes (Signed)
Charge RN at bedside/Intergation of AICD in process

## 2015-03-16 DIAGNOSIS — E785 Hyperlipidemia, unspecified: Secondary | ICD-10-CM | POA: Diagnosis not present

## 2015-03-16 DIAGNOSIS — I2510 Atherosclerotic heart disease of native coronary artery without angina pectoris: Secondary | ICD-10-CM

## 2015-03-16 DIAGNOSIS — I255 Ischemic cardiomyopathy: Secondary | ICD-10-CM | POA: Diagnosis not present

## 2015-03-16 DIAGNOSIS — Z95810 Presence of automatic (implantable) cardiac defibrillator: Secondary | ICD-10-CM

## 2015-03-16 LAB — BASIC METABOLIC PANEL
ANION GAP: 9 (ref 5–15)
BUN: 31 mg/dL — ABNORMAL HIGH (ref 6–20)
CHLORIDE: 105 mmol/L (ref 101–111)
CO2: 25 mmol/L (ref 22–32)
Calcium: 9.7 mg/dL (ref 8.9–10.3)
Creatinine, Ser: 1.26 mg/dL — ABNORMAL HIGH (ref 0.61–1.24)
GFR calc non Af Amer: 56 mL/min — ABNORMAL LOW (ref >60)
Glucose, Bld: 120 mg/dL — ABNORMAL HIGH (ref 65–99)
Potassium: 3.7 mmol/L (ref 3.5–5.1)
Sodium: 139 mmol/L (ref 135–145)

## 2015-03-16 LAB — CBC WITH DIFFERENTIAL/PLATELET
BASOS ABS: 0.1 10*3/uL (ref 0.0–0.1)
BASOS PCT: 1 %
Eosinophils Absolute: 0.2 10*3/uL (ref 0.0–0.7)
Eosinophils Relative: 4 %
HEMATOCRIT: 40.6 % (ref 39.0–52.0)
Hemoglobin: 13.9 g/dL (ref 13.0–17.0)
LYMPHS PCT: 37 %
Lymphs Abs: 2.5 10*3/uL (ref 0.7–4.0)
MCH: 30.5 pg (ref 26.0–34.0)
MCHC: 34.2 g/dL (ref 30.0–36.0)
MCV: 89.2 fL (ref 78.0–100.0)
Monocytes Absolute: 0.8 10*3/uL (ref 0.1–1.0)
Monocytes Relative: 11 %
NEUTROS ABS: 3.3 10*3/uL (ref 1.7–7.7)
Neutrophils Relative %: 47 %
Platelets: 191 10*3/uL (ref 150–400)
RBC: 4.55 MIL/uL (ref 4.22–5.81)
RDW: 13.9 % (ref 11.5–15.5)
WBC: 6.8 10*3/uL (ref 4.0–10.5)

## 2015-03-16 LAB — PROTIME-INR
INR: 2.17 — AB (ref 0.00–1.49)
Prothrombin Time: 24 seconds — ABNORMAL HIGH (ref 11.6–15.2)

## 2015-03-16 LAB — I-STAT TROPONIN, ED: Troponin i, poc: 0.01 ng/mL (ref 0.00–0.08)

## 2015-03-16 LAB — MAGNESIUM: MAGNESIUM: 2.2 mg/dL (ref 1.7–2.4)

## 2015-03-16 LAB — BRAIN NATRIURETIC PEPTIDE: B NATRIURETIC PEPTIDE 5: 166 pg/mL — AB (ref 0.0–100.0)

## 2015-03-16 MED ORDER — POTASSIUM CHLORIDE CRYS ER 20 MEQ PO TBCR
40.0000 meq | EXTENDED_RELEASE_TABLET | Freq: Once | ORAL | Status: AC
  Administered 2015-03-16: 40 meq via ORAL
  Filled 2015-03-16: qty 2

## 2015-03-16 NOTE — Discharge Instructions (Signed)
Cardioverter Defibrillator Implantation, Care After Kevin Hart, all of your tests tonight were normal. See Dr. Rayann Heman in clinic within 3 days. For any worsening symptoms come back to emergency department immediately. Thank you. Refer to this sheet in the next few weeks. These instructions provide you with information on caring for yourself after your procedure. Your health care provider may also give you more specific instructions. Your treatment has been planned according to current medical practices, but problems sometimes occur. Call your health care provider if you have any problems or questions after your procedure.  WHAT TO EXPECT AFTER THE PROCEDURE  You may feel pain. Some pain is normal. It may last a few days.  A slight bump may be seen over the skin where the device was placed. Sometimes, it is possible to feel the device under the skin. This is normal.  In the months and years afterward, your health care provider will check the device, the leads, and the battery every few months. Eventually, when the battery is low, the device will be replaced. HOME CARE INSTRUCTIONS  Medicines  Take medicines only as directed by your health care provider.  If you were prescribed an antibiotic medicine, finish it all even if you start to feel better.   Do not take any other medicines without asking your health care provider first. Some medicines, including certain painkillers, can cause bleeding after surgery.  Wound Care  Do not remove the bandage on your chest until directed to do so by your health care provider.  Once your bandage is removed, you may see pieces of tape called skin adhesive strips over the area where the cut was made (incision site). Let them fall off on their own.   Check the incision site every day to make sure it is not infected, bleeding, or starting to pull apart.  Do not use lotions or ointments near the incision site unless directed to do so.   Keep the incision  area clean and dry for 2-3 days after the procedure or as directed by your health care provider. It takes several weeks for the incision site to completely heal.   Do not take baths, swim, or use a hot tub until your health care provider approves. Activities  Try to walk a little every day. Exercising is important after this procedure. It is also important to use your shoulder on the side of the pacemaker in daily tasks that do not require exaggerated motion.  Avoid sudden jerking, pulling, or chopping movements that pull your upper arm far away from your body for at least 6 weeks.  Do not lift your upper arm above your shoulders for at least 6 weeks. This means no tennis, golf, or swimming for this period of time. If you sleep with the arm above your head, use a restraint to prevent this from happening as you sleep.  You may go back to work when your health care provider says it is okay. Check with your health care provider before you start to drive or play sports.  Other Instructions  Follow diet instructions if they were provided. You should be able to eat what you usually do right away, but you may need to limit your salt intake.   Weigh yourself every day. If you suddenly gain weight, fluid may be building up in your body.   Always carry your pacemaker identification card with you. The card should list the implant date, device model, and manufacturer. Consider wearing a medical alert  bracelet or necklace.  Tell all health care providers that you have a pacemaker. This may prevent them from giving you a magnetic resonance imaging (MRI) scan because of the strong magnets used during that test.  If you must pass through a metal detector, quickly walk through it. Do not stop under the detector or stand near it.  Avoid places or objects with a strong electric or magnetic field, including:   Engineer, maintenance. When at the airport, let officials know you have a pacemaker. Your ID  card will let you be checked in a way that is safe for you and that will not damage your pacemaker. Also, do not let a security person wave a magnetic wand near your pacemaker. That can make it stop working.  Power plants.   Large electrical generators.   Radiofrequency transmission towers, such as cell phone and radio towers.   Do not use amateur (ham) radio equipment or electric (arc) welding torches. Some devices are safe to use if held at least 1 foot from your pacemaker. These include power tools, lawn mowers, and speakers. If you are unsure of whether something is safe to use, ask your health care provider.   You may safely use electric blankets, heating pads, computers, and microwave ovens.   When using your cell phone, hold it to the ear opposite the pacemaker. Do not leave your cell phone in a pocket over the pacemaker.   Keep all follow-up visits as directed by your health care provider. This is how your health care provider makes sure your chest is healing the way it should. Ask your health care provider when you should come back to have your stitches or staples taken out.   Have your pacemaker checked every 3-6 months or as directed by your health care provider. Most pacemakers last for 4-8 years before a new one is needed. SEEK MEDICAL CARE IF:   You feel one shock in your chest.  You gain weight suddenly.   Your legs or feet swell more than they have before.   It feels like your heart is fluttering or skipping beats (heart palpitations).  You have a fever. SEEK IMMEDIATE MEDICAL CARE IF:   You have chest pain.  You feel more than one shock.  You feel more short of breath than you have felt before.  You feel more light-headed than you have felt before.  You have problems with your incision site, such as swelling or bleeding, or it starts to open up.   You notice signs of infection around your incision site. Watch for:   Warmth.   Redness.    Worsening pain.   Swelling.   Fluid leaking from the incision site.    This information is not intended to replace advice given to you by your health care provider. Make sure you discuss any questions you have with your health care provider.   Document Released: 09/26/2004 Document Revised: 03/30/2014 Document Reviewed: 03/30/2012 Elsevier Interactive Patient Education [employer].

## 2015-03-16 NOTE — ED Notes (Signed)
MD at bedside. 

## 2015-03-16 NOTE — Consult Note (Signed)
Cardiology Consult    Patient ID: Kevin Hart MRN: [id number], DOB/AGE: 70-Jul-1947  Admit date: 03/15/2015 Date of Consult: 03/16/2015 Primary Physician: Guadlupe Spanish, MD Primary Cardiologist: Martinique, Peter, MD Requesting Provider:  Everlene Balls MD  History of Present Illness    69 yo M w a h/o CAD with numerous prior interventions most recently DES to LAD for in-stent restenosis, ICM (EF 25-30%) s/p SJM ICD (11/27/14 by Dr. Rayann Heman), HLD, multiple DVT's (lupus anticoagulant positive), & subarachnoid bleed originally called this evening with complaints of his ICD vibrating.  This started at 4:00 pm & would occur for a couple seconds at a time ~ 10 times in a row & then stop for a while.  He denied recent falls.  He denied chest pain, lightheadedness, palpitations, dyspnea, lower extremity swelling, orthopnea, or paroxysmal nocturnal dyspnea.  He has not felt a similar sensation previously.  He also denied indigestion, constipation, or diarrhea.  He is at his baseline level of physical function, developing dyspnea when walking up hill.  Interrogation of his device in the ED was negative for evidence of pathology or alarms.  Capture & impedance were stable from prior.  There was the thought that he was occasionally diaphragmatically pacing, so his was intentionally paced & did not re-experience the symptoms.  There was also the idea that this could be related to intermittent PVC's.  However, observation of these in the ED did not correlate with symptoms.  Labs were relatively unremarkable except a K of 3.7, which was repleted.  His EKG was stable from prior.  A CXR was unremarkable.  Of note, symptoms resolved while he was in the ED.    Past Medical History   Past Medical History  Diagnosis Date  . CAD (coronary artery disease)     a. prior LAD stenting;  b. 06/2014 NSTEMI/PCI: LM nl, LAD 30p, 14m ISR (3.0x20 Promus DES), LCX nl, RCA nondom, nl, EF 25-30% ant, apical, dist inf [place].  Marland Kitchen DVT  (deep venous thrombosis) (HCC)     RECURRENT left leg  . Dyslipidemia   . GERD (gastroesophageal reflux disease)   . Anterior myocardial infarction ([place])   . Lupus anticoagulant positive     on coumadin  . S/P drug eluting coronary stent placement, 07/06/14, Promus 07-29-14  . At risk for sudden cardiac death, EF 25-30% with PVCs Jul 29, 2014  . Ischemic cardiomyopathy 07/06/14    EF 25-30%  . PVC's (premature ventricular contractions)   . Rotator cuff tear     R side  . Gout   . BPH (benign prostatic hyperplasia)   . Subarachnoid bleed ([place]) 2015    after falling and hitting his head  . Chronic systolic CHF (congestive heart failure) ([place]) 12/31/2014    Past Surgical History  Procedure Laterality Date  . Kidney stone    . Coronary angioplasty    . Left heart catheterization with coronary angiogram [name] 07/06/2014    Procedure: LEFT HEART CATHETERIZATION WITH CORONARY ANGIOGRAM;  Surgeon: Peter M Martinique, MD;  Location: [facility] CATH LAB;  Service: Cardiovascular;  Laterality: [name];  . Percutaneous coronary stent intervention (pci-s) Right 07/06/2014    Procedure: PERCUTANEOUS CORONARY STENT INTERVENTION (PCI-S);  Surgeon: Peter M Martinique, MD;  Location: [facility] CATH LAB;  Service: Cardiovascular;  Laterality: Right;  . Ep implantable device [name] 11/27/2014    Procedure: ICD Implant;  Surgeon: Thompson Grayer, MD;  Location: [place] CV LAB;  Service: Cardiovascular;  Laterality: [name];    Allergies  No Known Allergies  Family History    Family History  Problem Relation Age of Onset  . Aneurysm Father    Social History    Social History   Social History  . Marital Status: Widowed    Spouse Name: [name]  . Number of Children: 6  . Years of Education: [name]   Occupational History  . Not on file.   Social History Main Topics  . Smoking status: Never Smoker   . Smokeless tobacco: Not on file  . Alcohol Use: No  . Drug Use: No  . Sexual Activity: Yes    Birth Control/ Protection: None    Other Topics Concern  . Not on file   Social History Narrative   Pt lives in [place] with spouse.  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    Review of Systems    General:  No chills, fever, night sweats or weight changes.  Cardiovascular:  No chest pain, dyspnea on exertion, edema, orthopnea, palpitations, paroxysmal nocturnal dyspnea. Dermatological: No rash, lesions/masses Respiratory: No cough, dyspnea Urologic: No hematuria, dysuria Abdominal:   No nausea, vomiting, diarrhea, bright red blood per rectum, melena, or hematemesis Neurologic:  No visual changes, wkns, changes in mental status. All other systems reviewed and are otherwise negative except as noted above.  Physical Exam    Blood pressure 113/76, pulse 68, temperature 98.1 F (36.7 C), temperature source Oral, resp. rate 16, SpO2 96 %.  General: Pleasant, NAD Psych: Normal affect. Neuro: Alert and oriented X 3. Moves all extremities spontaneously. HEENT: Normal  Neck: Supple without bruits or JVD. Lungs:  Resp regular and unlabored, CTA. Heart: RRR no s3, s4, or murmurs. Abdomen: Soft, non-tender, non-distended, BS + x 4.  Extremities: No clubbing, cyanosis or edema. DP/PT/Radials 2+ and equal bilaterally.  Labs    Troponin [facility] of Care Test)  Recent Labs  03/16/15 0019  TROPIPOC 0.01   No results for input(s): CKTOTAL, CKMB, TROPONINI in the last 72 hours. Lab Results  Component Value Date   WBC 6.8 03/16/2015   HGB 13.9 03/16/2015   HCT 40.6 03/16/2015   MCV 89.2 03/16/2015   PLT 191 03/16/2015    Recent Labs Lab 03/16/15 0010  NA 139  K 3.7  CL 105  CO2 25  BUN 31*  CREATININE 1.26*  CALCIUM 9.7  GLUCOSE 120*   Lab Results  Component Value Date   CHOL 113 07/25/2014   HDL 31.90* 07/25/2014   LDLCALC 57 07/25/2014   TRIG 122.0 07/25/2014   No results found for: [facility]   Radiology Studies    Dg Chest 2 View  03/15/2015  CLINICAL DATA:  AICD firing, acute onset.   Initial encounter. EXAM: CHEST  2 VIEW COMPARISON:  Chest radiograph performed 11/27/2014 FINDINGS: The lungs are well-aerated and clear. There is no evidence of focal opacification, pleural effusion or pneumothorax. The heart is normal in size; a AICD is noted ending at the right ventricle. No acute osseous abnormalities are seen. IMPRESSION: No acute cardiopulmonary process seen. Electronically Signed   By: Garald Balding M.D.   On: 03/15/2015 23:53   Assessment & Plan    A/P:  CAD with numerous prior interventions most recently DES to LAD for in-stent restenosis, ICM (EF 25-30%) s/p SJM ICD (11/27/14 by Dr. Rayann Heman), HLD, multiple DVT's (lupus anticoagulant positive), & subarachnoid bleed originally called this evening with complaints of his ICD vibrating.    # Complaint of ICD vibration - There was no evidence  to support this.  In fact, his patient notifier vibration is programmed to last 16 seconds, which was much longer than the vibrations he was describing.  As this is not apparently related to his ICD or PVC's, it was explained to the pt that this could have possibly been indigestion.   - He can follow-up with Dr. Rayann Heman on an outpatient basis in the next week.    # h/o CAD - Troponin negative.  EKG stable from prior. - Agree with his home Atorvastatin, Carvedilol, Clopidogrel.  No ASA while on Warfarin.    # h/o ICM - Euvolemic.  Baseline NYHA II, ACC/AHA C. - Agree with his home Carvedilol, Lisinopril, & Spironolactone.    # h/o HLD - Agree with his home Lipitor.  Signed, Alfonso Ramus, MD 03/16/2015, 7:24 AM

## 2015-03-16 NOTE — ED Notes (Signed)
[facility] called to speak with Dr.Oni regarding AICD interrogation

## 2015-04-22 ENCOUNTER — Encounter: Payer: Self-pay | Admitting: Internal Medicine

## 2015-04-22 ENCOUNTER — Ambulatory Visit (INDEPENDENT_AMBULATORY_CARE_PROVIDER_SITE_OTHER): Payer: PPO | Admitting: Internal Medicine

## 2015-04-22 VITALS — BP 128/80 | HR 73 | Ht 72.0 in | Wt 244.4 lb

## 2015-04-22 DIAGNOSIS — I519 Heart disease, unspecified: Secondary | ICD-10-CM | POA: Diagnosis not present

## 2015-04-22 DIAGNOSIS — I255 Ischemic cardiomyopathy: Secondary | ICD-10-CM

## 2015-04-22 LAB — CUP PACEART INCLINIC DEVICE CHECK
Battery Remaining Longevity: 99.6
Brady Statistic RV Percent Paced: 0.19 %
HIGH POWER IMPEDANCE MEASURED VALUE: 75 Ohm
Implantable Lead Implant Date: 20160906
Lead Channel Pacing Threshold Amplitude: 0.75 V
Lead Channel Sensing Intrinsic Amplitude: 11.8 mV
Lead Channel Setting Pacing Amplitude: 2.5 V
Lead Channel Setting Pacing Pulse Width: 0.5 ms
MDC IDC LEAD LOCATION: 753860
MDC IDC MSMT LEADCHNL RV IMPEDANCE VALUE: 450 Ohm
MDC IDC MSMT LEADCHNL RV PACING THRESHOLD AMPLITUDE: 0.75 V
MDC IDC MSMT LEADCHNL RV PACING THRESHOLD PULSEWIDTH: 0.5 ms
MDC IDC MSMT LEADCHNL RV PACING THRESHOLD PULSEWIDTH: 0.5 ms
MDC IDC SESS DTM: 20170130143643
MDC IDC SET LEADCHNL RV SENSING SENSITIVITY: 0.5 mV
Pulse Gen Serial Number: 7257239

## 2015-04-22 NOTE — Progress Notes (Signed)
Electrophysiology Office Note   Date:  04/22/2015   ID:  Carry, Mcrea [birth year], MRN [id number]  PCP:  Guadlupe Spanish, MD  Cardiologist:  Dr Martinique Primary Electrophysiologist: Thompson Grayer, MD    Chief Complaint  Patient presents with  . Cardiomyopathy     History of Present Illness: Kevin Hart is a 70 y.o. male who presents today for electrophysiology evaluation.   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  He has done well since his ICD implantation.  He had a sensation of vibration over his L chest 03/15/15 for which he presented to [facility] ED and was reassured.   Today, he denies symptoms of palpitations, chest pain, shortness of breath, orthopnea, PND, lower extremity edema, claudication, dizziness, presyncope, syncope, bleeding, or neurologic sequela. The patient is tolerating medications without difficulties and is otherwise without complaint today.    Past Medical History  Diagnosis Date  . CAD (coronary artery disease)     a. prior LAD stenting;  b. 06/2014 NSTEMI/PCI: LM nl, LAD 30p, 25m ISR (3.0x20 Promus DES), LCX nl, RCA nondom, nl, EF 25-30% ant, apical, dist inf AK.  Marland Kitchen DVT (deep venous thrombosis) (HCC)     RECURRENT left leg  . Dyslipidemia   . GERD (gastroesophageal reflux disease)   . Anterior myocardial infarction ([place])   . Lupus anticoagulant positive     on coumadin  . S/P drug eluting coronary stent placement, 07/06/14, Promus Aug 01, 2014  . At risk for sudden cardiac death, EF 25-30% with PVCs 08-01-2014  . Ischemic cardiomyopathy 07/06/14    EF 25-30%  . PVC's (premature ventricular contractions)   . Rotator cuff tear     R side  . Gout   . BPH (benign prostatic hyperplasia)   . Subarachnoid bleed ([place]) 2015    after falling and hitting his head  . Chronic systolic CHF (congestive heart  failure) ([place]) 12/31/2014   Past Surgical History  Procedure Laterality Date  . Kidney stone    . Coronary angioplasty    . Left heart catheterization with coronary angiogram [name] 07/06/2014    Procedure: LEFT HEART CATHETERIZATION WITH CORONARY ANGIOGRAM;  Surgeon: Peter M Martinique, MD;  Location: [facility] CATH LAB;  Service: Cardiovascular;  Laterality: [name];  . Percutaneous coronary stent intervention (pci-s) Right 07/06/2014    Procedure: PERCUTANEOUS CORONARY STENT INTERVENTION (PCI-S);  Surgeon: Peter M Martinique, MD;  Location: [facility] CATH LAB;  Service: Cardiovascular;  Laterality: Right;  . Ep implantable device [name] 11/27/2014    St. Jude Medical Fortify Assura VR  implanted by Dr Rayann Heman for primary prevention     Current Outpatient Prescriptions  Medication Sig Dispense Refill  . allopurinol (ZYLOPRIM) 100 MG tablet Take 100 mg by mouth daily with breakfast.     . atorvastatin (LIPITOR) 80 MG tablet TAKE ONE (1) TABLET BY MOUTH EVERY DAY AT 6PM 30 tablet 6  . carvedilol (COREG) 6.25 MG tablet Take 1 tablet (6.25 mg total) by mouth 2 (two) times daily. 90 tablet 6  . clopidogrel (PLAVIX) 75 MG tablet TAKE ONE (1) TABLET EACH DAY WITH BREAKFAST 30 tablet 6  . furosemide (LASIX) 40 MG tablet Take 40 mg by mouth daily.      Marland Kitchen lisinopril (PRINIVIL,ZESTRIL) 2.5 MG tablet Take 1 tablet (2.5 mg total) by mouth daily. 30 tablet 6  .  Multiple Vitamin (MULTI-VITAMINS) TABS Take 1 tablet by mouth daily at 12 noon.    . nitroGLYCERIN (NITROSTAT) 0.4 MG SL tablet Place 1 tablet (0.4 mg total) under the tongue every 5 (five) minutes x 3 doses as needed for chest pain. 25 tablet 11  . pantoprazole (PROTONIX) 40 MG tablet Take 1 tablet (40 mg total) by mouth daily. 30 tablet 2  . spironolactone (ALDACTONE) 25 MG tablet Take 1 tablet (25 mg total) by mouth daily. 30 tablet 6  . Tamsulosin HCl (FLOMAX) 0.4 MG CAPS Take 0.4 mg by mouth daily.     . Vitamin D, Ergocalciferol, (DRISDOL) 50000 UNITS CAPS capsule Take 1  capsule by mouth once a week.     . warfarin (COUMADIN) 5 MG tablet Take 5 mg by mouth as directed. Do not take on Thursdays     No current facility-administered medications for this visit.    Allergies:   Review of patient's allergies indicates no known allergies.   Social History:  The patient  reports that he has never smoked. He does not have any smokeless tobacco history on file. He reports that he does not drink alcohol or use illicit drugs.   Family History:  The patient's  family history includes Aneurysm in his father.    ROS:  Please see the history of present illness.   All other systems are reviewed and negative.    PHYSICAL EXAM: VS:  BP 128/80 mmHg  Pulse 73  Ht 6' (1.829 m)  Wt 244 lb 6.4 oz (110.859 kg)  BMI 33.14 kg/m2 , BMI Body mass index is 33.14 kg/(m^2). GEN: Well nourished, well developed, in no acute distress HEENT: normal Neck: no JVD, carotid bruits, or masses Cardiac: RRR; no murmurs, rubs, or gallops,no edema  Respiratory:  clear to auscultation bilaterally, normal work of breathing GI: soft, nontender, nondistended, + BS MS: no deformity or atrophy Skin: warm and dry  Neuro:  Strength and sensation are intact Psych: euthymic mood, full affect  EKG:  EKG is ordered today. The ekg ordered today shows sinus rhythm 73 bpm, PR 200,  anterio septal infarction, QRS 82 msec, Qtc 389   Recent Labs: 07/25/2014: ALT 25 03/16/2015: B Natriuretic Peptide 166.0*; BUN 31*; Creatinine, Ser 1.26*; Hemoglobin 13.9; Magnesium 2.2; Platelets 191; Potassium 3.7; Sodium 139    Lipid Panel     Component Value Date/Time   CHOL 113 07/25/2014 1029   TRIG 122.0 07/25/2014 1029   HDL 31.90* 07/25/2014 1029   CHOLHDL 4 07/25/2014 1029   VLDL 24.4 07/25/2014 1029   LDLCALC 57 07/25/2014 1029     Wt Readings from Last 3 Encounters:  04/22/15 244 lb 6.4 oz (110.859 kg)  12/31/14 235 lb 1.6 oz (106.641 kg)  11/27/14 234 lb 9.1 oz (106.4 kg)      Other studies  Reviewed: Additional studies/ records that were reviewed today include: echo, Dr Morrison Old notes  Review of the above records today demonstrates: EF 25%   ASSESSMENT AND PLAN:  1.  Ischemic CM/ CAD Doing well s/p ICD implantation Normal device function Will enroll in ICM device clnic  euvolemic today  Current medicines are reviewed at length with the patient today.   The patient does not have concerns regarding his medicines.  The following changes were made today:  None   [place] Return to see EP NP in 1 year Follow-up with Dr Martinique as scheduled May be able to stop plavix upon follow-up with Dr Martinique as he is  also on Frostproof.  Will defer this decision to Dr Martinique.  Army Fossa, MD  04/22/2015 2:37 PM     [place] [address] [phone] (office) [phone] (fax)

## 2015-04-22 NOTE — Progress Notes (Signed)
Patient referred to [facility] clinic by Dr Rayann Heman at office appointment 04/22/2015.  ICM intro given and he agreed to monthly follow up.  Scheduled 1st remote ICM transmission for ICM and patient given enrollment letter with date.  Advised the transmission will be automatically sent between 12:00 AM and 5:00 AM as long as the monitor is by the beside and he confirmed it is.  Encouraged him to call if he has any fluid retention symptoms.

## 2015-04-22 NOTE — Patient Instructions (Signed)
Medication Instructions:  Your physician recommends that you continue on your current medications as directed. Please refer to the Current Medication list given to you today.   Labwork: None ordered   Testing/Procedures: None ordered   Follow-Up: Your physician wants you to follow-up in: 12 months with Dr Rayann Heman Dennis Bast will receive a reminder letter in the mail two months in advance. If you don't receive a letter, please call our office to schedule the follow-up appointment.   Remote monitoring is used to monitor your  ICD from home. This monitoring reduces the number of office visits required to check your device to one time per year. It allows [place] to keep an eye on the functioning of your device to ensure it is working properly. You are scheduled for a device check from home on 07/22/15. You may send your transmission at any time that day. If you have a wireless device, the transmission will be sent automatically. After your physician reviews your transmission, you will receive a postcard with your next transmission date.  Sharman Cheek, RN ---- ICM calls monthly     Any Other Special Instructions Will Be Listed Below (If Applicable).     If you need a refill on your cardiac medications before your next appointment, please call your pharmacy.

## 2015-05-15 ENCOUNTER — Telehealth: Payer: Self-pay | Admitting: Cardiology

## 2015-05-15 DIAGNOSIS — Z7901 Long term (current) use of anticoagulants: Secondary | ICD-10-CM | POA: Diagnosis not present

## 2015-05-15 DIAGNOSIS — I82409 Acute embolism and thrombosis of unspecified deep veins of unspecified lower extremity: Secondary | ICD-10-CM | POA: Diagnosis not present

## 2015-05-15 DIAGNOSIS — I10 Essential (primary) hypertension: Secondary | ICD-10-CM | POA: Diagnosis not present

## 2015-05-15 NOTE — Telephone Encounter (Signed)
Returned call to patient.He stated PCP wants to change coumadin to eliquis.Also wants to ask Dr.Jordan if he wants him to continue plavix.Message sent to [place].

## 2015-05-15 NOTE — Telephone Encounter (Signed)
PCP also wanting to know if Dr.Jordan wants him to continue plavix.

## 2015-05-15 NOTE — Telephone Encounter (Signed)
Returned call to patient Dr.Jordan's recommendations given. 

## 2015-05-15 NOTE — Telephone Encounter (Signed)
New Message  Pt requested to speak w/ RN- stated that his PCP wanted pt to switch from  coum to eliquis but needed Dr Morrison Old approval since pt is also on Plavix. Please call back and discuss.

## 2015-05-15 NOTE — Telephone Encounter (Signed)
It is OK with me to switch from coumadin to Eliquis.  Vasilia Dise Martinique MD, [facility]

## 2015-05-15 NOTE — Telephone Encounter (Signed)
Yes he needs to continue Plavix.  Peter Martinique MD, [facility]

## 2015-05-23 ENCOUNTER — Ambulatory Visit (INDEPENDENT_AMBULATORY_CARE_PROVIDER_SITE_OTHER): Payer: PPO

## 2015-05-23 DIAGNOSIS — I5022 Chronic systolic (congestive) heart failure: Secondary | ICD-10-CM

## 2015-05-23 DIAGNOSIS — Z95810 Presence of automatic (implantable) cardiac defibrillator: Secondary | ICD-10-CM

## 2015-06-05 ENCOUNTER — Telehealth: Payer: Self-pay | Admitting: Internal Medicine

## 2015-06-05 NOTE — Telephone Encounter (Signed)
Pt would like a call back concerning his device please.   Pt thinks he got a call today.

## 2015-06-05 NOTE — Progress Notes (Signed)
EPIC Encounter for ICM Monitoring  Patient Name: Kevin Hart is a 70 y.o. male Date: 06/05/2015 Primary Care Physican: Guadlupe Spanish, MD Primary Cardiologist: Martinique Electrophysiologist: Allred Dry Weight: unknown   In the past month, have you:  1. Gained more than 2 pounds in a day or more than 5 pounds in a week? [name]  2. Had changes in your medications (with verification of current medications)? [name]  3. Had more shortness of breath than is usual for you? [name]  4. Limited your activity because of shortness of breath? [name]  5. Not been able to sleep because of shortness of breath? [name]  6. Had increased swelling in your feet or ankles? [name]  7. Had symptoms of dehydration (dizziness, dry mouth, increased thirst, decreased urine output) [name]  8. Had changes in sodium restriction? [name]  9. Been compliant with medication? [name]   ICM trend: 3 month view for 05/23/2015   ICM trend: 1 year view for 05/23/2015    Direct Trend Viewer through 05/28/2015   Follow-up plan: ICM clinic phone appointment 07/04/2015.  Attempted call for 1st ICM encounter and unable to reach patient.  Transmission reviewed.  Thoracic impedance below reference line from 05/13/2015 to 05/18/2015, 05/20/2015 to 05/22/2015,  And 05/23/2015 to 05/25/2015 suggesting fluid accumulation.  Thoracic impedance returned and above reference line 05/25/2015 to 05/28/2015 suggesting dryness but back to reference line on 05/28/2015.     Rosalene Billings, RN, CCM 06/05/2015 8:16 AM

## 2015-06-05 NOTE — Progress Notes (Signed)
Received call back from patient.  1st ICM encounter.  Reviewed ICM transmission and explained there were some days that he may have had some fluid accumulation and he denied any symptoms.  Education given on fluid symptoms to report.  Current baseline weight is 238 lbs.  Encouraged him to call for any fluid symptoms.  Advised next remote transmission will be 07/04/2015 and he has an appointment with Dr Martinique on 07/05/2015.   [place]    No changes today.

## 2015-06-05 NOTE — Telephone Encounter (Signed)
Spoke with patient.

## 2015-06-20 ENCOUNTER — Other Ambulatory Visit: Payer: Self-pay | Admitting: *Deleted

## 2015-06-20 MED ORDER — CARVEDILOL 6.25 MG PO TABS: 6.2500 mg | ORAL_TABLET | Freq: Two times a day (BID) | ORAL | Status: DC

## 2015-06-26 DIAGNOSIS — C44519 Basal cell carcinoma of skin of other part of trunk: Secondary | ICD-10-CM | POA: Diagnosis not present

## 2015-06-26 DIAGNOSIS — Z08 Encounter for follow-up examination after completed treatment for malignant neoplasm: Secondary | ICD-10-CM | POA: Diagnosis not present

## 2015-06-26 DIAGNOSIS — Z85828 Personal history of other malignant neoplasm of skin: Secondary | ICD-10-CM | POA: Diagnosis not present

## 2015-07-04 ENCOUNTER — Ambulatory Visit (INDEPENDENT_AMBULATORY_CARE_PROVIDER_SITE_OTHER): Payer: PPO

## 2015-07-04 DIAGNOSIS — Z95810 Presence of automatic (implantable) cardiac defibrillator: Secondary | ICD-10-CM

## 2015-07-04 DIAGNOSIS — I5022 Chronic systolic (congestive) heart failure: Secondary | ICD-10-CM

## 2015-07-04 NOTE — Progress Notes (Signed)
EPIC Encounter for ICM Monitoring  Patient Name: Kevin Hart is a 70 y.o. male Date: 07/04/2015 Primary Care Physican: Guadlupe Spanish, MD Primary Cardiologist: Martinique Electrophysiologist: Allred Dry Weight: 243 lbs    In the past month, have you:  1. Gained more than 2 pounds in a day or more than 5 pounds in a week? No  2. Had changes in your medications (with verification of current medications)? No  3. Had more shortness of breath than is usual for you? No  4. Limited your activity because of shortness of breath? No  5. Not been able to sleep because of shortness of breath? [name]  6. Had increased swelling in your feet or ankles? No  7. Had symptoms of dehydration (dizziness, dry mouth, increased thirst, decreased urine output) No  8. Had changes in sodium restriction? No  9. Been compliant with medication? No   ICM trend: 3 month view for 07/04/2015   ICM trend: 1 year view for 07/04/2015   Follow-up plan: ICM clinic phone appointment on 07/22/2015.  Spoke with patient.  Transmission reviewed.  Thoracic impedance below reference line from 06/22/2015 to 07/04/2015 suggesting fluid accumulation.  He reported he thinks that he did not think he had any fluid since he has been urinating a lot.  Advised the device suggests he may have some at this time and he denied any fluid symptoms today.     Advised I would send copy for Dr Martinique for review prior to his appointment tomorrow, 07/05/2015.    Copy of note sent to patient's primary care physician, primary cardiologist, and device following physician.  Rosalene Billings, RN, CCM 07/04/2015 9:29 AM

## 2015-07-05 ENCOUNTER — Encounter: Payer: Self-pay | Admitting: Cardiology

## 2015-07-05 ENCOUNTER — Ambulatory Visit (INDEPENDENT_AMBULATORY_CARE_PROVIDER_SITE_OTHER): Payer: PPO | Admitting: Cardiology

## 2015-07-05 VITALS — BP 114/82 | HR 82 | Ht 72.0 in | Wt 250.6 lb

## 2015-07-05 DIAGNOSIS — I82502 Chronic embolism and thrombosis of unspecified deep veins of left lower extremity: Secondary | ICD-10-CM

## 2015-07-05 DIAGNOSIS — I2510 Atherosclerotic heart disease of native coronary artery without angina pectoris: Secondary | ICD-10-CM

## 2015-07-05 DIAGNOSIS — I5022 Chronic systolic (congestive) heart failure: Secondary | ICD-10-CM | POA: Diagnosis not present

## 2015-07-05 DIAGNOSIS — Z955 Presence of coronary angioplasty implant and graft: Secondary | ICD-10-CM

## 2015-07-05 NOTE — Patient Instructions (Signed)
Continue to exercise  You need to lose weight  We will schedule you for an Echocardiogram

## 2015-07-05 NOTE — Progress Notes (Signed)
Cardiology Office Note   Date:  07/05/2015   ID:  Song, Genn[place] [birth year], MRN [id number]  PCP:  Guadlupe Spanish, MD  Cardiologist:  Dr Martinique  Fredi Geiler Martinique, MD   Chief Complaint  Patient presents with  . Follow-up    Pt states he has no Sx.    History of Present Illness: Kevin Hart is a 70 y.o. 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   Cath showed 90% mid LAD stenosis within an old stent. EF was 25-30%.  Titration of medications has been limited by orthostasis but he is tolerating current meds. Repeat Echo 10/02/14 showed EF still 25-30%. He underwent ICD implant on 11/27/14 by Dr. Rayann Heman.   On follow up today he is feeling well. He has no  palpitations. Fleeting chest pain only lasting 2-3 seconds. Only mild dyspnea when pushing mower. No other dyspnea, orthopnea, PND, or edema. Weight has increased 15 lbs since October which he attributes to Christmas. He is exercising daily.    Past Medical History  Diagnosis Date  . CAD (coronary artery disease)     a. prior LAD stenting;  b. 06/2014 NSTEMI/PCI: LM nl, LAD 30p, 69m ISR (3.0x20 Promus DES), LCX nl, RCA nondom, nl, EF 25-30% ant, apical, dist inf AK.  Marland Kitchen DVT (deep venous thrombosis) (HCC)     RECURRENT left leg  . Dyslipidemia   . GERD (gastroesophageal reflux disease)   . Anterior myocardial infarction ([place])   . Lupus anticoagulant positive     on coumadin  . S/P drug eluting coronary stent placement, 07/06/14, Promus 08-06-14  . At risk for sudden cardiac death, EF 25-30% with PVCs 08/06/14  . Ischemic cardiomyopathy 07/06/14    EF 25-30%  . PVC's (premature ventricular contractions)   . Rotator cuff tear     R side  . Gout   . BPH (benign prostatic hyperplasia)   . Subarachnoid bleed ([place]) 2015   after falling and hitting his head  . Chronic systolic CHF (congestive heart failure) ([place]) 12/31/2014    Past Surgical History  Procedure Laterality Date  . Kidney stone    . Coronary angioplasty    . Left heart catheterization with coronary angiogram [name] 07/06/2014    Procedure: LEFT HEART CATHETERIZATION WITH CORONARY ANGIOGRAM;  Surgeon: Nhi Butrum M Martinique, MD;  Location: [facility] CATH LAB;  Service: Cardiovascular;  Laterality: [name];  . Percutaneous coronary stent intervention (pci-s) Right 07/06/2014    Procedure: PERCUTANEOUS CORONARY STENT INTERVENTION (PCI-S);  Surgeon: Haiden Rawlinson M Martinique, MD;  Location: [facility] CATH LAB;  Service: Cardiovascular;  Laterality: Right;  . Ep implantable device [name] 11/27/2014    St. Jude Medical Fortify Assura VR  implanted by Dr Rayann Heman for primary prevention    Current Outpatient Prescriptions  Medication Sig Dispense Refill  . allopurinol (ZYLOPRIM) 100 MG tablet Take 100 mg by mouth daily with breakfast.     . atorvastatin (LIPITOR) 80 MG tablet TAKE ONE (1) TABLET BY MOUTH EVERY DAY AT 6PM 30 tablet 6  . carvedilol (COREG) 6.25 MG tablet Take 1 tablet (6.25 mg total) by mouth 2 (two) times daily. 180 tablet 0  . clopidogrel (PLAVIX) 75 MG tablet TAKE ONE (1) TABLET EACH DAY WITH BREAKFAST 30 tablet 6  .  ELIQUIS 5 MG TABS tablet Take 1 tablet by mouth 2 (two) times daily.    . furosemide (LASIX) 40 MG tablet Take 40 mg by mouth daily.      Marland Kitchen lisinopril (PRINIVIL,ZESTRIL) 2.5 MG tablet Take 1 tablet (2.5 mg total) by mouth daily. 30 tablet 6  . Multiple Vitamin (MULTI-VITAMINS) TABS Take 1 tablet by mouth daily at 12 noon.    . nitroGLYCERIN (NITROSTAT) 0.4 MG SL tablet Place 1 tablet (0.4 mg total) under the tongue every 5 (five) minutes x 3 doses as needed for chest pain. 25 tablet 11  . pantoprazole (PROTONIX) 40 MG tablet Take 1 tablet (40 mg total) by mouth daily. 30 tablet 2  . spironolactone (ALDACTONE) 25 MG tablet Take 1 tablet (25 mg total) by mouth daily. 30  tablet 6  . Tamsulosin HCl (FLOMAX) 0.4 MG CAPS Take 0.4 mg by mouth daily.     . Vitamin D, Ergocalciferol, (DRISDOL) 50000 UNITS CAPS capsule Take 1 capsule by mouth once a week.      No current facility-administered medications for this visit.    Allergies:   Review of patient's allergies indicates no known allergies.    Social History:  The patient  reports that he has never smoked. He does not have any smokeless tobacco history on file. He reports that he does not drink alcohol or use illicit drugs.   Family History:  The patient's family history includes Aneurysm in his father.    ROS:  Please see the history of present illness. All other systems are reviewed and negative.    PHYSICAL EXAM: VS:  BP 114/82 mmHg  Pulse 82  Ht 6' (1.829 m)  Wt 113.671 kg (250 lb 9.6 oz)  BMI 33.98 kg/m2  SpO2 96% , BMI Body mass index is 33.98 kg/(m^2). GEN: Well nourished, well developed, in no acute distress HEENT: normal Neck: no JVD, carotid bruits, or masses Cardiac: RRR; no murmurs, rubs, or gallops,no edema;  ICD in left subclavicular area has healed well. Respiratory:  clear to auscultation bilaterally GI: soft, nontender, nondistended, + BS MS: no deformity or atrophy Skin: warm and dry, no rash Ext: no edema. Left calf is slightly larger related to old DVT Neuro:  Strength and sensation are intact Psych: euthymic mood, full affect   EKG:  EKG is not  ordered today.    Recent Labs: 07/25/2014: ALT 25 03/16/2015: B Natriuretic Peptide 166.0*; BUN 31*; Creatinine, Ser 1.26*; Hemoglobin 13.9; Magnesium 2.2; Platelets 191; Potassium 3.7; Sodium 139    Lipid Panel    Component Value Date/Time   CHOL 113 07/25/2014 1029   TRIG 122.0 07/25/2014 1029   HDL 31.90* 07/25/2014 1029   CHOLHDL 4 07/25/2014 1029   VLDL 24.4 07/25/2014 1029   LDLCALC 57 07/25/2014 1029     Wt Readings from Last 3 Encounters:  07/05/15 113.671 kg (250 lb 9.6 oz)  04/22/15 110.859 kg (244 lb 6.4  oz)  12/31/14 106.641 kg (235 lb 1.6 oz)     Other studies Reviewed: Echo 10/02/14:Study Conclusions  - Left ventricle: The cavity size was normal. Wall thickness was normal. Systolic function was severely reduced. The estimated ejection fraction was in the range of 25% to 30%. There is akinesis of the anteroseptal and apical myocardium. Doppler parameters are consistent with abnormal left ventricular relaxation (grade 1 diastolic dysfunction). - Aortic valve: There was mild regurgitation. - Aortic root: The aortic root was mildly dilated. - Left atrium: The atrium was mildly dilated. -  Right atrium: The atrium was mildly dilated.  Impressions:  - Akinesis of the anteroseptal wall and apex with overall severely reduced LV function; grade 1 diastolic dysfunction; mild AI; mlld LAE and RAE.   ASSESSMENT AND PLAN:  1.  CAD: s/p DES of mid LAD for instent restenosis in April 2016.  He is asymptomatic. Now on Plavix and Eliquis. He is now one year out from last stent. We discussed whether or not to continue Plavix. Since he has 2 layers of stent in the LAD he is at a higher long term risk of late stent thrombosis. He has no bleeding issues on dual therapy and I would favor continuing Plavix with Eliquis for now. He understands that data on this point is very limited. If he should develop any bleeding issues then we will stop Plavix.   2.  Chronic systolic CHF. EF 25-30%- persistent. Continue medical management. He is s/p prophylactic ICD. Previously orthostatic hypotension limited further titration of meds. He is currently class 1 so doesn't meet criteria for Entresto. Will repeat Echo now.   3.  Hyperlipidemia now on high dose lipitor. Excellent control.   4. History of recurrent DVT with positive lupus anticoagulant. On long term Eliquis.   5. Weight gain. Does not appear volume overloaded so I think this is mostly related to calorie intake. Encourage weight  loss.  Current medicines are reviewed at length with the patient today.  The patient does not have concerns regarding medicines.  The following changes have been made:  None  Labs/ tests ordered today include:  Orders Placed This Encounter  Procedures  . ECHOCARDIOGRAM COMPLETE   Disposition:   FU with Dr Martinique in 6 months  Signed, Frederic Tones Martinique, MD, [facility]  07/05/2015 10:46 AM    [address], [place], [place]  [id number] Phone: [phone]; Fax: [phone]

## 2015-07-22 ENCOUNTER — Telehealth: Payer: Self-pay | Admitting: Cardiology

## 2015-07-22 ENCOUNTER — Encounter: Payer: PPO | Admitting: *Deleted

## 2015-07-22 NOTE — Telephone Encounter (Signed)
LMOVM reminding pt to send remote transmission.   

## 2015-07-26 ENCOUNTER — Ambulatory Visit (HOSPITAL_COMMUNITY): Payer: PPO | Attending: Cardiology

## 2015-07-26 ENCOUNTER — Other Ambulatory Visit: Payer: Self-pay

## 2015-07-26 ENCOUNTER — Encounter: Payer: Self-pay | Admitting: Cardiology

## 2015-07-26 ENCOUNTER — Telehealth: Payer: Self-pay | Admitting: Cardiology

## 2015-07-26 DIAGNOSIS — Z6834 Body mass index (BMI) 34.0-34.9, adult: Secondary | ICD-10-CM | POA: Insufficient documentation

## 2015-07-26 DIAGNOSIS — I5022 Chronic systolic (congestive) heart failure: Secondary | ICD-10-CM | POA: Diagnosis not present

## 2015-07-26 DIAGNOSIS — I255 Ischemic cardiomyopathy: Secondary | ICD-10-CM | POA: Insufficient documentation

## 2015-07-26 DIAGNOSIS — Z955 Presence of coronary angioplasty implant and graft: Secondary | ICD-10-CM

## 2015-07-26 DIAGNOSIS — E669 Obesity, unspecified: Secondary | ICD-10-CM | POA: Insufficient documentation

## 2015-07-26 DIAGNOSIS — I77810 Thoracic aortic ectasia: Secondary | ICD-10-CM | POA: Diagnosis not present

## 2015-07-26 DIAGNOSIS — I351 Nonrheumatic aortic (valve) insufficiency: Secondary | ICD-10-CM | POA: Insufficient documentation

## 2015-07-26 DIAGNOSIS — I82502 Chronic embolism and thrombosis of unspecified deep veins of left lower extremity: Secondary | ICD-10-CM | POA: Diagnosis not present

## 2015-07-26 DIAGNOSIS — R29898 Other symptoms and signs involving the musculoskeletal system: Secondary | ICD-10-CM | POA: Diagnosis not present

## 2015-07-26 DIAGNOSIS — I059 Rheumatic mitral valve disease, unspecified: Secondary | ICD-10-CM | POA: Diagnosis not present

## 2015-07-26 DIAGNOSIS — I2510 Atherosclerotic heart disease of native coronary artery without angina pectoris: Secondary | ICD-10-CM | POA: Insufficient documentation

## 2015-07-26 DIAGNOSIS — E785 Hyperlipidemia, unspecified: Secondary | ICD-10-CM | POA: Diagnosis not present

## 2015-07-26 DIAGNOSIS — I110 Hypertensive heart disease with heart failure: Secondary | ICD-10-CM | POA: Insufficient documentation

## 2015-07-26 DIAGNOSIS — I071 Rheumatic tricuspid insufficiency: Secondary | ICD-10-CM | POA: Diagnosis not present

## 2015-07-26 NOTE — Telephone Encounter (Signed)
LMOVM requesting that pt send manual transmission b/c home monitor has not updated in at least 8 days.   

## 2015-07-29 NOTE — Progress Notes (Signed)
No ICM remote transmission received for 07/22/2015.  Next transmission scheduled for 08/07/2015.

## 2015-08-01 DIAGNOSIS — R300 Dysuria: Secondary | ICD-10-CM | POA: Diagnosis not present

## 2015-08-01 DIAGNOSIS — R5383 Other fatigue: Secondary | ICD-10-CM | POA: Diagnosis not present

## 2015-08-01 DIAGNOSIS — E559 Vitamin D deficiency, unspecified: Secondary | ICD-10-CM | POA: Diagnosis not present

## 2015-08-01 DIAGNOSIS — I10 Essential (primary) hypertension: Secondary | ICD-10-CM | POA: Diagnosis not present

## 2015-08-01 DIAGNOSIS — E7800 Pure hypercholesterolemia, unspecified: Secondary | ICD-10-CM | POA: Diagnosis not present

## 2015-08-01 DIAGNOSIS — D539 Nutritional anemia, unspecified: Secondary | ICD-10-CM | POA: Diagnosis not present

## 2015-08-01 DIAGNOSIS — E291 Testicular hypofunction: Secondary | ICD-10-CM | POA: Diagnosis not present

## 2015-08-01 DIAGNOSIS — I509 Heart failure, unspecified: Secondary | ICD-10-CM | POA: Diagnosis not present

## 2015-08-05 ENCOUNTER — Telehealth: Payer: Self-pay | Admitting: Cardiology

## 2015-08-05 NOTE — Telephone Encounter (Signed)
LMOVM requesting that pt send manual transmission b/c home monitor has not updated in at least 8 days.   

## 2015-08-06 ENCOUNTER — Telehealth: Payer: Self-pay | Admitting: Internal Medicine

## 2015-08-06 NOTE — Telephone Encounter (Signed)
Follow-up       Pt calling returning a phone missed regarding [place] called

## 2015-08-06 NOTE — Telephone Encounter (Signed)
Spoke w/ pt and he stated that he tried to send a manual transmission last week. But his home monitor did not work properly. Pt was not near his monitor so he is going to call back once he his home from his vacation.

## 2015-08-07 ENCOUNTER — Telehealth: Payer: Self-pay | Admitting: Cardiology

## 2015-08-07 NOTE — Telephone Encounter (Signed)
LMOVM reminding pt to send remote transmission.   

## 2015-08-14 ENCOUNTER — Telehealth: Payer: Self-pay | Admitting: Cardiology

## 2015-08-14 NOTE — Telephone Encounter (Signed)
LMOVM requesting that pt send manual transmission b/c home monitor has not updated in at least 8 days.   

## 2015-08-24 ENCOUNTER — Emergency Department (HOSPITAL_BASED_OUTPATIENT_CLINIC_OR_DEPARTMENT_OTHER)
Admission: EM | Admit: 2015-08-24 | Discharge: 2015-08-24 | Disposition: A | Discharge status: Home / Self Care | Payer: PPO | Attending: Emergency Medicine | Admitting: Emergency Medicine

## 2015-08-24 ENCOUNTER — Encounter (HOSPITAL_BASED_OUTPATIENT_CLINIC_OR_DEPARTMENT_OTHER): Payer: Self-pay | Admitting: *Deleted

## 2015-08-24 DIAGNOSIS — I252 Old myocardial infarction: Secondary | ICD-10-CM | POA: Insufficient documentation

## 2015-08-24 DIAGNOSIS — W272XXA Contact with scissors, initial encounter: Secondary | ICD-10-CM | POA: Diagnosis not present

## 2015-08-24 DIAGNOSIS — E785 Hyperlipidemia, unspecified: Secondary | ICD-10-CM | POA: Diagnosis not present

## 2015-08-24 DIAGNOSIS — Y999 Unspecified external cause status: Secondary | ICD-10-CM | POA: Insufficient documentation

## 2015-08-24 DIAGNOSIS — Y9389 Activity, other specified: Secondary | ICD-10-CM | POA: Diagnosis not present

## 2015-08-24 DIAGNOSIS — I2510 Atherosclerotic heart disease of native coronary artery without angina pectoris: Secondary | ICD-10-CM | POA: Insufficient documentation

## 2015-08-24 DIAGNOSIS — Z23 Encounter for immunization: Secondary | ICD-10-CM | POA: Diagnosis not present

## 2015-08-24 DIAGNOSIS — I5022 Chronic systolic (congestive) heart failure: Secondary | ICD-10-CM | POA: Insufficient documentation

## 2015-08-24 DIAGNOSIS — S61211A Laceration without foreign body of left index finger without damage to nail, initial encounter: Secondary | ICD-10-CM | POA: Insufficient documentation

## 2015-08-24 DIAGNOSIS — S61219A Laceration without foreign body of unspecified finger without damage to nail, initial encounter: Secondary | ICD-10-CM

## 2015-08-24 DIAGNOSIS — Y929 Unspecified place or not applicable: Secondary | ICD-10-CM | POA: Insufficient documentation

## 2015-08-24 MED ORDER — TETANUS-DIPHTH-ACELL PERTUSSIS 5-2.5-18.5 LF-MCG/0.5 IM SUSP
0.5000 mL | Freq: Once | INTRAMUSCULAR | Status: AC
  Administered 2015-08-24: 0.5 mL via INTRAMUSCULAR
  Filled 2015-08-24: qty 0.5

## 2015-08-24 MED ORDER — BACITRACIN ZINC 500 UNIT/GM EX OINT: 1.0000 "application " | TOPICAL_OINTMENT | Freq: Two times a day (BID) | CUTANEOUS | Status: DC

## 2015-08-24 NOTE — ED Notes (Signed)
Patient cut left hand index finger with sissors on Thursday, bleeding controlled and concerned because he states it is not healing properly and he is on coumadin

## 2015-08-24 NOTE — Discharge Instructions (Signed)
Nonsutured Laceration Care °A laceration is a cut that goes through all layers of the skin and extends into the tissue that is right under the skin. This type of cut is usually stitched up (sutured) or closed with tape (adhesive strips) or skin glue shortly after the injury happens. °However, if the wound is dirty or if several hours pass before medical treatment is provided, it is likely that germs (bacteria) will enter the wound. Closing a laceration after bacteria have entered it increases the risk of infection. In these cases, your health care provider may leave the laceration open (nonsutured) and cover it with a bandage. This type of treatment helps prevent infection and allows the wound to heal from the deepest layer of tissue damage up to the surface. °An open fracture is a type of injury that may involve nonsutured lacerations. An open fracture is a break in a bone that happens along with one or more lacerations through the skin that is near the fracture site. °HOW TO CARE FOR YOUR NONSUTURED LACERATION °· Take or apply over-the-counter and prescription medicines only as told by your health care provider. °· If you were prescribed an antibiotic medicine, take or apply it as told by your health care provider. Do not stop using the antibiotic even if your condition improves. °· Clean the wound one time each day or as told by your health care provider. °¨ Wash the wound with mild soap and water. °¨ Rinse the wound with water to remove all soap. °¨ Pat your wound dry with a clean towel. Do not rub the wound. °· Do not inject anything into the wound unless your health care provider told you to. °· Change any bandages (dressings) as told by your health care provider. This includes changing the dressing if it gets wet, dirty, or starts to smell bad. °· Keep the dressing dry until your health care provider says it can be removed. Do not take baths, swim, or do anything that puts your wound underwater until your  health care provider approves. °· Raise (elevate) the injured area above the level of your heart while you are sitting or lying down, if possible. °· Do not scratch or pick at the wound. °· Check your wound every day for signs of infection. Watch for: °¨ Redness, swelling, or pain. °¨ Fluid, blood, or pus. °· Keep all follow-up visits as told by your health care provider. This is important. °SEEK MEDICAL CARE IF: °· You received a tetanus and shot and you have swelling, severe pain, redness, or bleeding at the injection site.   °· You have a fever. °· Your pain is not controlled with medicine. °· You have increased redness, swelling, or pain at the site of your wound. °· You have fluid, blood, or pus coming from your wound. °· You notice a bad smell coming from your wound or your dressing. °· You notice something coming out of the wound, such as wood or glass. °· You notice a change in the color of your skin near your wound. °· You develop a new rash. °· You need to change the dressing frequently due to fluid, blood, or pus draining from the wound. °· You develop numbness around your wound. °SEEK IMMEDIATE MEDICAL CARE IF: °· Your pain suddenly increases and is severe. °· You develop severe swelling around the wound. °· The wound is on your hand or foot and you cannot properly move a finger or toe. °· The wound is on your hand or   foot and you notice that your fingers or toes look pale or bluish. °· You have a red streak going away from your wound. °  °This information is not intended to replace advice given to you by your health care provider. Make sure you discuss any questions you have with your health care provider. °  °Document Released: 02/04/2006 Document Revised: 07/24/2014 Document Reviewed: 03/05/2014 °Elsevier Interactive Patient Education ©2016 [employer] ° °

## 2015-08-24 NOTE — ED Provider Notes (Signed)
CSN: [id number]     Arrival date & time 08/24/15  [id number] History   First MD Initiated Contact with Patient 08/24/15 [phone]     Chief Complaint  Patient presents with  . Extremity Laceration    Kevin Hart is a 70 y.o. male on Eliquis who presents to the ED complaining of a left index finger laceration sustained 3 days ago. The patient reports he was using Scissors and axially sustained a laceration to the tip of his left index finger. He is right-hand dominant. He reports he's been changing his bandages regularly. He reports today when he read his bandage she noticed it was still bleeding somewhat. It has not been bleeding through his bandage. He is unsure when his last tetanus shot was. No fevers, numbness, weakness.   The history is provided by the patient. No language interpreter was used.    Past Medical History  Diagnosis Date  . CAD (coronary artery disease)     a. prior LAD stenting;  b. 06/2014 NSTEMI/PCI: LM nl, LAD 30p, 64m ISR (3.0x20 Promus DES), LCX nl, RCA nondom, nl, EF 25-30% ant, apical, dist inf [place].  Marland Kitchen DVT (deep venous thrombosis) (HCC)     RECURRENT left leg  . Dyslipidemia   . GERD (gastroesophageal reflux disease)   . Anterior myocardial infarction ([place])   . Lupus anticoagulant positive     on coumadin  . S/P drug eluting coronary stent placement, 07/06/14, Promus Jul 29, 2014  . At risk for sudden cardiac death, EF 25-30% with PVCs 07-29-14  . Ischemic cardiomyopathy 07/06/14    EF 25-30%  . PVC's (premature ventricular contractions)   . Rotator cuff tear     R side  . Gout   . BPH (benign prostatic hyperplasia)   . Subarachnoid bleed ([place]) 2015    after falling and hitting his head  . Chronic systolic CHF (congestive heart failure) ([place]) 12/31/2014   Past Surgical History  Procedure Laterality Date  . Kidney stone    . Coronary angioplasty    . Left heart catheterization with coronary angiogram [name] 07/06/2014    Procedure: LEFT HEART CATHETERIZATION WITH  CORONARY ANGIOGRAM;  Surgeon: Peter M Martinique, MD;  Location: [facility] CATH LAB;  Service: Cardiovascular;  Laterality: [name];  . Percutaneous coronary stent intervention (pci-s) Right 07/06/2014    Procedure: PERCUTANEOUS CORONARY STENT INTERVENTION (PCI-S);  Surgeon: Peter M Martinique, MD;  Location: [facility] CATH LAB;  Service: Cardiovascular;  Laterality: Right;  . Ep implantable device [name] 11/27/2014    [employer] Jude Medical Fortify Assura VR  implanted by Dr Rayann Heman for primary prevention   Family History  Problem Relation Age of Onset  . Aneurysm Father    Social History  Substance Use Topics  . Smoking status: Never Smoker   . Smokeless tobacco: None  . Alcohol Use: No    Review of Systems  Constitutional: Negative for fever.  Skin: Positive for wound.  Neurological: Negative for weakness and numbness.      Allergies  Review of patient's allergies indicates no known allergies.  Home Medications   Prior to Admission medications   Medication Sig Start Date End Date Taking? Authorizing Provider  allopurinol (ZYLOPRIM) 100 MG tablet Take 100 mg by mouth daily with breakfast.  06/04/14   Historical Provider, MD  atorvastatin (LIPITOR) 80 MG tablet TAKE ONE (1) TABLET BY MOUTH EVERY DAY AT [facility] 03/06/15   Peter M Martinique, MD  bacitracin ointment Apply 1 application topically 2 (two) times daily. 08/24/15  Waynetta Pean, PA-C  carvedilol (COREG) 6.25 MG tablet Take 1 tablet (6.25 mg total) by mouth 2 (two) times daily. 06/20/15   Peter M Martinique, MD  clopidogrel (PLAVIX) 75 MG tablet TAKE ONE (1) TABLET EACH DAY WITH BREAKFAST 01/04/15   Peter M Martinique, MD  ELIQUIS 5 MG TABS tablet Take 1 tablet by mouth 2 (two) times daily. 06/10/15   Historical Provider, MD  furosemide (LASIX) 40 MG tablet Take 40 mg by mouth daily.      Historical Provider, MD  lisinopril (PRINIVIL,ZESTRIL) 2.5 MG tablet Take 1 tablet (2.5 mg total) by mouth daily. 03/12/15   Peter M Martinique, MD  Multiple Vitamin (MULTI-VITAMINS) TABS Take  1 tablet by mouth daily at 12 noon.    Historical Provider, MD  nitroGLYCERIN (NITROSTAT) 0.4 MG SL tablet Place 1 tablet (0.4 mg total) under the tongue every 5 (five) minutes x 3 doses as needed for chest pain. 10/09/14   Peter M Martinique, MD  pantoprazole (PROTONIX) 40 MG tablet Take 1 tablet (40 mg total) by mouth daily. 07/07/14   Lendon Colonel, NP  spironolactone (ALDACTONE) 25 MG tablet Take 1 tablet (25 mg total) by mouth daily. 03/12/15   Peter M Martinique, MD  Tamsulosin HCl (FLOMAX) 0.4 MG CAPS Take 0.4 mg by mouth daily.     Historical Provider, MD  Vitamin D, Ergocalciferol, (DRISDOL) 50000 UNITS CAPS capsule Take 1 capsule by mouth once a week.     Historical Provider, MD   BP 113/78 mmHg  Pulse 82  Temp(Src) 98.1 F (36.7 C) (Oral)  Resp 20  Ht 6' (1.829 m)  Wt 111.131 kg  BMI 33.22 kg/m2  SpO2 98% Physical Exam  Constitutional: He appears well-developed and well-nourished. No distress.  HENT:  Head: Normocephalic and atraumatic.  Eyes: Right eye exhibits no discharge. Left eye exhibits no discharge.  Cardiovascular: Normal rate, regular rhythm and intact distal pulses.   Good capillary refill distal fingertips.  Pulmonary/Chest: Effort normal. No respiratory distress.  Musculoskeletal: Normal range of motion.  Good strength of his left index finger.   Neurological: He is alert. Coordination normal.  Skin: Skin is warm and dry. No rash noted. He is not diaphoretic.  Less than 0.5 cm laceration to his left distal index fingertip. Bleeding is controlled. There was a small amount of dried blood to his bandage, but this had not bled through his bandage. No discharge. No erythema. No sign of infection.   Psychiatric: He has a normal mood and affect. His behavior is normal.  Nursing note and vitals reviewed.   ED Course  Wound repair Date/Time: 08/24/2015 9:30 AM Performed by: Waynetta Pean Authorized by: Waynetta Pean Consent: Verbal consent not obtained. Risks and  benefits: risks, benefits and alternatives were discussed Consent given by: patient Patient understanding: patient states understanding of the procedure being performed Patient consent: the patient's understanding of the procedure matches consent given Procedure consent: procedure consent matches procedure scheduled Relevant documents: relevant documents present and verified Site marked: the operative site was marked Required items: required blood products, implants, devices, and special equipment available Patient identity confirmed: verbally with patient Time out: Immediately prior to procedure a "time out" was called to verify the correct patient, procedure, equipment, support staff and site/side marked as required. Local anesthesia used: no Patient sedated: no Patient tolerance: Patient tolerated the procedure well with no immediate complications Comments: Wound repair and cleaning of left index finger tip.    (including critical care time) Labs  Review Labs Reviewed - No data to display  Imaging Review No results found.    EKG Interpretation None     Filed Vitals:   08/24/15 0841  BP: 113/78  Pulse: 82  Temp: 98.1 F (36.7 C)  TempSrc: Oral  Resp: 20  Height: 6' (1.829 m)  Weight: 111.131 kg  SpO2: 98%    MDM   Meds given in ED:  Medications  Tdap (BOOSTRIX) injection 0.5 mL (not administered)    New Prescriptions   BACITRACIN OINTMENT    Apply 1 application topically 2 (two) times daily.    Final diagnoses:  Finger laceration, initial encounter   This is a 70 y.o. male on Eliquis who presents to the ED complaining of a left index finger laceration sustained 3 days ago. The patient reports he was using Scissors and axially sustained a laceration to the tip of his left index finger. He is right-hand dominant. He reports he's been changing his bandages regularly. He reports today when he read his bandage she noticed it was still bleeding somewhat. It has not  been bleeding through his bandage. He is unsure when his last tetanus shot was. Tdap will be updated today in the ED.  On exam the patient is afebrile nontoxic appearing. He is a small less than 0.5 cm laceration to his left index fingertip. There is a small amount of dry blood to his bandage but this had not bled through his bandage. Bleeding is controlled. No active bleeding. No sign of infection. The wound appears to be healing well. I performed wound repair and placed bacitracin and non-adherent bandage. I discussed wound care. I provided him with wound care supplies. Will discharge with rx for bacitracin. I encouraged him to follow up with his PCP for wound recheck. I discussed return precautions. I advised the patient to follow-up with their primary care provider this week. I advised the patient to return to the emergency department with new or worsening symptoms or new concerns. The patient verbalized understanding and agreement with plan.        Waynetta Pean, PA-C 08/24/15 [id number]  Charlesetta Shanks, MD 09/08/15 [id number]

## 2015-08-28 NOTE — Progress Notes (Signed)
Next remote ICM transmission scheduled for 09/09/2015.  No ICM transmission received 08/07/2015.

## 2015-09-04 ENCOUNTER — Telehealth: Payer: Self-pay | Admitting: Cardiology

## 2015-09-04 NOTE — Telephone Encounter (Signed)
Spoke w/ pt and attempted to help him send a remote transmission b/c his home monitor has not updated since 07-17-15. Pt pressed large white button one time and the monitor came on but when he pressed the button again to initiate the transmission the monitor would not do anything. Pt attempted this three times. Pt is going to call tech services to see if he needs a new monitor.

## 2015-09-04 NOTE — Telephone Encounter (Signed)
New message     1. Has your device fired? No   2. Is you device beeping? no  3. Are you experiencing draining or swelling at device site? no  4. Are you calling to see if we received your device transmission? The pt states the device is down loading correctly  5. Have you passed out?no

## 2015-09-04 NOTE — Telephone Encounter (Signed)
LMOVM requesting call back.  Gave device clinic phone number for return call.  Merlin transmitter has not updated since 07/17/15.  Will request a manual transmission.

## 2015-09-09 ENCOUNTER — Telehealth: Payer: Self-pay | Admitting: Cardiology

## 2015-09-09 DIAGNOSIS — I10 Essential (primary) hypertension: Secondary | ICD-10-CM | POA: Diagnosis not present

## 2015-09-09 DIAGNOSIS — I509 Heart failure, unspecified: Secondary | ICD-10-CM | POA: Diagnosis not present

## 2015-09-09 DIAGNOSIS — M109 Gout, unspecified: Secondary | ICD-10-CM | POA: Diagnosis not present

## 2015-09-09 DIAGNOSIS — I2510 Atherosclerotic heart disease of native coronary artery without angina pectoris: Secondary | ICD-10-CM | POA: Diagnosis not present

## 2015-09-09 NOTE — Telephone Encounter (Signed)
LMOVM reminding pt to send remote transmission.   

## 2015-09-10 ENCOUNTER — Other Ambulatory Visit: Payer: Self-pay

## 2015-09-10 MED ORDER — CLOPIDOGREL BISULFATE 75 MG PO TABS: 75.0000 mg | ORAL_TABLET | Freq: Every day | ORAL | Status: DC

## 2015-09-10 NOTE — Telephone Encounter (Signed)
Rx(s) sent to pharmacy electronically.  

## 2015-09-12 ENCOUNTER — Ambulatory Visit (INDEPENDENT_AMBULATORY_CARE_PROVIDER_SITE_OTHER): Payer: PPO | Admitting: *Deleted

## 2015-09-12 DIAGNOSIS — I255 Ischemic cardiomyopathy: Secondary | ICD-10-CM | POA: Diagnosis not present

## 2015-09-12 DIAGNOSIS — I5022 Chronic systolic (congestive) heart failure: Secondary | ICD-10-CM

## 2015-09-12 DIAGNOSIS — Z95810 Presence of automatic (implantable) cardiac defibrillator: Secondary | ICD-10-CM

## 2015-09-12 NOTE — Progress Notes (Signed)
Remote ICD transmission.   

## 2015-09-13 NOTE — Progress Notes (Signed)
EPIC Encounter for ICM Monitoring  Patient Name: Kevin Hart is a 70 y.o. male Date: 09/13/2015 Primary Care Physican: Guadlupe Spanish, MD Primary Cardiologist: Martinique Electrophysiologist: Allred Dry Weight: 243 lbs       In the past month, have you:  1. Gained more than 2 pounds in a day or more than 5 pounds in a week? No  2. Had changes in your medications (with verification of current medications)? No  3. Had more shortness of breath than is usual for you? No   4. Limited your activity because of shortness of breath? No   5. Not been able to sleep because of shortness of breath? No   6. Had increased swelling in your feet, ankles, legs or stomach area? No   7. Had symptoms of dehydration (dizziness, dry mouth, increased thirst, decreased urine output) No   8. Had changes in sodium restriction? No   9. Been compliant with medication? Yes   ICM trend: 3 month view for 09/13/2015   ICM trend: 1 year view for 09/13/2015   Follow-up plan: ICM clinic phone appointment 10/17/2015.    FLUID LEVELS: Corvue thoracic impedance decreased 08/14/2015 to 08/24/2015 and 09/02/2015 to 09/07/2015 suggesting fluid accumulation and returned to baseline 09/07/2015.    SYMPTOMS:   He stated his wedding ring gets tight when he has fluid and denied having any symptoms at this time.  Encouraged to call for any fluid symptoms.   EDUCATION: Limit sodium intake to < 2000 mg and fluid intake to 64 oz daily.     RECOMMENDATIONS: No changes today.        Rosalene Billings, RN, CCM 09/13/2015 3:51 PM

## 2015-09-17 LAB — CUP PACEART REMOTE DEVICE CHECK
Battery Remaining Longevity: 96 mo
Battery Remaining Percentage: 89 %
Battery Voltage: 3.13 V
Brady Statistic RV Percent Paced: 1 %
Date Time Interrogation Session: 20170622153503
HighPow Impedance: 78 Ohm
HighPow Impedance: 78 Ohm
Implantable Lead Implant Date: 20160906
Implantable Lead Location: 753860
Lead Channel Impedance Value: 450 Ohm
Lead Channel Sensing Intrinsic Amplitude: 11.8 mV
Lead Channel Setting Pacing Amplitude: 2.5 V
Lead Channel Setting Pacing Pulse Width: 0.5 ms
Lead Channel Setting Sensing Sensitivity: 0.5 mV
Pulse Gen Serial Number: 7257239

## 2015-09-23 ENCOUNTER — Telehealth: Payer: Self-pay | Admitting: Cardiology

## 2015-09-23 DIAGNOSIS — Z79899 Other long term (current) drug therapy: Secondary | ICD-10-CM

## 2015-09-23 MED ORDER — CARVEDILOL 6.25 MG PO TABS
6.2500 mg | ORAL_TABLET | Freq: Two times a day (BID) | ORAL | Status: DC
Start: 2015-09-23 — End: 2016-03-31

## 2015-09-23 MED ORDER — EPLERENONE 25 MG PO TABS: 25.0000 mg | ORAL_TABLET | Freq: Every day | ORAL | Status: DC

## 2015-09-23 NOTE — Telephone Encounter (Signed)
Pt explains that he recently saw primary care physician and c/o nipple sensitivity and "breast growth". He is having significant enough growth that it is getting close to his implanted ICD. States some concern about this.  Notes not urgent issue - he has noted the symptoms for ~ 2 months now, but PCP pointed out that it was likely a med issue.  He is not sure which medication - I asked if he remembers spironolactone being mentioned for this, he states possibly, but unsure.  Informed patient I would get recommendation from pharmD and return call. He voiced thanks.

## 2015-09-23 NOTE — Telephone Encounter (Signed)
Recommendations communicated, pt verbalized understanding. He will start new medication, get BMET when ready, and call if new concerns.

## 2015-09-23 NOTE — Telephone Encounter (Signed)
New message       The pt would like to speak with a nurse, no other information

## 2015-09-23 NOTE — Telephone Encounter (Signed)
Stop spironolactone and start eplerenone 25 mg once daily.  Repeat BMET in 10 days.  Breast tenderness should start to ease in a few days to weeks

## 2015-10-16 ENCOUNTER — Other Ambulatory Visit: Payer: Self-pay | Admitting: Cardiology

## 2015-10-17 ENCOUNTER — Ambulatory Visit (INDEPENDENT_AMBULATORY_CARE_PROVIDER_SITE_OTHER): Payer: PPO

## 2015-10-17 DIAGNOSIS — I5022 Chronic systolic (congestive) heart failure: Secondary | ICD-10-CM | POA: Diagnosis not present

## 2015-10-17 DIAGNOSIS — Z79899 Other long term (current) drug therapy: Secondary | ICD-10-CM | POA: Diagnosis not present

## 2015-10-17 DIAGNOSIS — Z95810 Presence of automatic (implantable) cardiac defibrillator: Secondary | ICD-10-CM

## 2015-10-18 LAB — BASIC METABOLIC PANEL
BUN: 15 mg/dL (ref 7–25)
CALCIUM: 9.9 mg/dL (ref 8.6–10.3)
CO2: 24 mmol/L (ref 20–31)
Chloride: 105 mmol/L (ref 98–110)
Creat: 1.09 mg/dL (ref 0.70–1.18)
GLUCOSE: 120 mg/dL — AB (ref 65–99)
POTASSIUM: 4.1 mmol/L (ref 3.5–5.3)
SODIUM: 140 mmol/L (ref 135–146)

## 2015-10-18 NOTE — Progress Notes (Signed)
EPIC Encounter for ICM Monitoring  Patient Name: Kevin Hart is a 70 y.o. male Date: 10/18/2015 Primary Care Physican: Guadlupe Spanish, MD Primary Cardiologist: Martinique Electrophysiologist: Allred Dry Weight: 244 lb       Heart Failure questions reviewed, pt asymptomatic   Thoracic impedance abnormal 10/01/2015 to 10/11/2015 and denied any symptoms during that time.  Impedance returned to baseline 10/11/2015.  Recommendations: No changes.  Low sodium diet education provided   ICM trend: 10/17/2015    Follow-up plan: ICM clinic phone appointment on 11/18/2015.  Copy of ICM check sent to device physician.   Rosalene Billings, RN 10/18/2015 1:36 PM

## 2015-10-19 DIAGNOSIS — M79644 Pain in right finger(s): Secondary | ICD-10-CM | POA: Diagnosis not present

## 2015-10-19 DIAGNOSIS — M25531 Pain in right wrist: Secondary | ICD-10-CM | POA: Diagnosis not present

## 2015-10-19 DIAGNOSIS — M109 Gout, unspecified: Secondary | ICD-10-CM | POA: Diagnosis not present

## 2015-10-19 DIAGNOSIS — S30811A Abrasion of abdominal wall, initial encounter: Secondary | ICD-10-CM | POA: Diagnosis not present

## 2015-10-19 DIAGNOSIS — Z7901 Long term (current) use of anticoagulants: Secondary | ICD-10-CM | POA: Diagnosis not present

## 2015-10-19 DIAGNOSIS — S20312A Abrasion of left front wall of thorax, initial encounter: Secondary | ICD-10-CM | POA: Diagnosis not present

## 2015-10-19 DIAGNOSIS — R51 Headache: Secondary | ICD-10-CM | POA: Diagnosis not present

## 2015-10-19 DIAGNOSIS — I10 Essential (primary) hypertension: Secondary | ICD-10-CM | POA: Diagnosis not present

## 2015-10-19 DIAGNOSIS — I252 Old myocardial infarction: Secondary | ICD-10-CM | POA: Diagnosis not present

## 2015-10-19 DIAGNOSIS — R079 Chest pain, unspecified: Secondary | ICD-10-CM | POA: Diagnosis not present

## 2015-10-19 DIAGNOSIS — Z79899 Other long term (current) drug therapy: Secondary | ICD-10-CM | POA: Diagnosis not present

## 2015-10-19 DIAGNOSIS — S50812A Abrasion of left forearm, initial encounter: Secondary | ICD-10-CM | POA: Diagnosis not present

## 2015-10-19 DIAGNOSIS — I2510 Atherosclerotic heart disease of native coronary artery without angina pectoris: Secondary | ICD-10-CM | POA: Diagnosis not present

## 2015-10-19 DIAGNOSIS — Z955 Presence of coronary angioplasty implant and graft: Secondary | ICD-10-CM | POA: Diagnosis not present

## 2015-10-19 DIAGNOSIS — S6991XA Unspecified injury of right wrist, hand and finger(s), initial encounter: Secondary | ICD-10-CM | POA: Diagnosis not present

## 2015-10-19 DIAGNOSIS — S0990XA Unspecified injury of head, initial encounter: Secondary | ICD-10-CM | POA: Diagnosis not present

## 2015-10-21 ENCOUNTER — Other Ambulatory Visit: Payer: Self-pay | Admitting: Cardiology

## 2015-10-21 NOTE — Telephone Encounter (Signed)
REFILL 

## 2015-11-15 DIAGNOSIS — M8589 Other specified disorders of bone density and structure, multiple sites: Secondary | ICD-10-CM | POA: Diagnosis not present

## 2015-11-15 DIAGNOSIS — Z79899 Other long term (current) drug therapy: Secondary | ICD-10-CM | POA: Diagnosis not present

## 2015-11-15 DIAGNOSIS — R5383 Other fatigue: Secondary | ICD-10-CM | POA: Diagnosis not present

## 2015-11-15 DIAGNOSIS — D539 Nutritional anemia, unspecified: Secondary | ICD-10-CM | POA: Diagnosis not present

## 2015-11-15 DIAGNOSIS — E559 Vitamin D deficiency, unspecified: Secondary | ICD-10-CM | POA: Diagnosis not present

## 2015-11-15 DIAGNOSIS — R079 Chest pain, unspecified: Secondary | ICD-10-CM | POA: Diagnosis not present

## 2015-11-15 DIAGNOSIS — M129 Arthropathy, unspecified: Secondary | ICD-10-CM | POA: Diagnosis not present

## 2015-11-15 DIAGNOSIS — E7800 Pure hypercholesterolemia, unspecified: Secondary | ICD-10-CM | POA: Diagnosis not present

## 2015-11-15 DIAGNOSIS — R0602 Shortness of breath: Secondary | ICD-10-CM | POA: Diagnosis not present

## 2015-11-15 DIAGNOSIS — E291 Testicular hypofunction: Secondary | ICD-10-CM | POA: Diagnosis not present

## 2015-11-18 ENCOUNTER — Ambulatory Visit (INDEPENDENT_AMBULATORY_CARE_PROVIDER_SITE_OTHER): Payer: PPO

## 2015-11-18 DIAGNOSIS — I5022 Chronic systolic (congestive) heart failure: Secondary | ICD-10-CM | POA: Diagnosis not present

## 2015-11-18 DIAGNOSIS — Z95810 Presence of automatic (implantable) cardiac defibrillator: Secondary | ICD-10-CM | POA: Diagnosis not present
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
## 2015-11-28 ENCOUNTER — Observation Stay (HOSPITAL_BASED_OUTPATIENT_CLINIC_OR_DEPARTMENT_OTHER)
Admission: EM | Admit: 2015-11-28 | Discharge: 2015-11-29 | Disposition: A | Discharge status: Home / Self Care | Payer: PPO | Attending: Emergency Medicine | Admitting: Emergency Medicine

## 2015-11-28 ENCOUNTER — Emergency Department (HOSPITAL_BASED_OUTPATIENT_CLINIC_OR_DEPARTMENT_OTHER): Payer: PPO

## 2015-11-28 DIAGNOSIS — S43006A Unspecified dislocation of unspecified shoulder joint, initial encounter: Secondary | ICD-10-CM

## 2015-11-28 DIAGNOSIS — N182 Chronic kidney disease, stage 2 (mild): Secondary | ICD-10-CM | POA: Diagnosis not present

## 2015-11-28 DIAGNOSIS — Y93H2 Activity, gardening and landscaping: Secondary | ICD-10-CM | POA: Insufficient documentation

## 2015-11-28 DIAGNOSIS — Z955 Presence of coronary angioplasty implant and graft: Secondary | ICD-10-CM | POA: Diagnosis not present

## 2015-11-28 DIAGNOSIS — Z7902 Long term (current) use of antithrombotics/antiplatelets: Secondary | ICD-10-CM | POA: Diagnosis not present

## 2015-11-28 DIAGNOSIS — Z86718 Personal history of other venous thrombosis and embolism: Secondary | ICD-10-CM | POA: Insufficient documentation

## 2015-11-28 DIAGNOSIS — M25512 Pain in left shoulder: Secondary | ICD-10-CM | POA: Diagnosis not present

## 2015-11-28 DIAGNOSIS — Z7901 Long term (current) use of anticoagulants: Secondary | ICD-10-CM | POA: Insufficient documentation

## 2015-11-28 DIAGNOSIS — I255 Ischemic cardiomyopathy: Secondary | ICD-10-CM | POA: Diagnosis not present

## 2015-11-28 DIAGNOSIS — E785 Hyperlipidemia, unspecified: Secondary | ICD-10-CM | POA: Diagnosis not present

## 2015-11-28 DIAGNOSIS — S42142A Displaced fracture of glenoid cavity of scapula, left shoulder, initial encounter for closed fracture: Secondary | ICD-10-CM | POA: Insufficient documentation

## 2015-11-28 DIAGNOSIS — S43085A Other dislocation of left shoulder joint, initial encounter: Secondary | ICD-10-CM | POA: Diagnosis not present

## 2015-11-28 DIAGNOSIS — S43015A Anterior dislocation of left humerus, initial encounter: Principal | ICD-10-CM | POA: Insufficient documentation

## 2015-11-28 DIAGNOSIS — I252 Old myocardial infarction: Secondary | ICD-10-CM | POA: Insufficient documentation

## 2015-11-28 DIAGNOSIS — M109 Gout, unspecified: Secondary | ICD-10-CM | POA: Insufficient documentation

## 2015-11-28 DIAGNOSIS — N400 Enlarged prostate without lower urinary tract symptoms: Secondary | ICD-10-CM | POA: Diagnosis not present

## 2015-11-28 DIAGNOSIS — W1830XA Fall on same level, unspecified, initial encounter: Secondary | ICD-10-CM | POA: Diagnosis not present

## 2015-11-28 DIAGNOSIS — I5022 Chronic systolic (congestive) heart failure: Secondary | ICD-10-CM | POA: Diagnosis not present

## 2015-11-28 DIAGNOSIS — Y92096 Garden or yard of other non-institutional residence as the place of occurrence of the external cause: Secondary | ICD-10-CM | POA: Diagnosis not present

## 2015-11-28 DIAGNOSIS — K219 Gastro-esophageal reflux disease without esophagitis: Secondary | ICD-10-CM | POA: Insufficient documentation

## 2015-11-28 DIAGNOSIS — Z79899 Other long term (current) drug therapy: Secondary | ICD-10-CM | POA: Diagnosis not present

## 2015-11-28 DIAGNOSIS — Z95810 Presence of automatic (implantable) cardiac defibrillator: Secondary | ICD-10-CM | POA: Insufficient documentation

## 2015-11-28 DIAGNOSIS — I2510 Atherosclerotic heart disease of native coronary artery without angina pectoris: Secondary | ICD-10-CM | POA: Insufficient documentation

## 2015-11-28 DIAGNOSIS — R52 Pain, unspecified: Secondary | ICD-10-CM

## 2015-11-28 LAB — CBC WITH DIFFERENTIAL/PLATELET
BASOS ABS: 0.1 10*3/uL (ref 0.0–0.1)
BASOS PCT: 1 %
EOS PCT: 1 %
Eosinophils Absolute: 0.2 10*3/uL (ref 0.0–0.7)
HEMATOCRIT: 42.4 % (ref 39.0–52.0)
Hemoglobin: 14.9 g/dL (ref 13.0–17.0)
LYMPHS PCT: 16 %
Lymphs Abs: 1.8 10*3/uL (ref 0.7–4.0)
MCH: 31.2 pg (ref 26.0–34.0)
MCHC: 35.1 g/dL (ref 30.0–36.0)
MCV: 88.7 fL (ref 78.0–100.0)
Monocytes Absolute: 0.9 10*3/uL (ref 0.1–1.0)
Monocytes Relative: 8 %
NEUTROS ABS: 8.4 10*3/uL — AB (ref 1.7–7.7)
Neutrophils Relative %: 74 %
PLATELETS: 206 10*3/uL (ref 150–400)
RBC: 4.78 MIL/uL (ref 4.22–5.81)
RDW: 14.4 % (ref 11.5–15.5)
WBC: 11.3 10*3/uL — AB (ref 4.0–10.5)

## 2015-11-28 LAB — TROPONIN I: Troponin I: <0.03 ng/mL (ref <0.03)

## 2015-11-28 LAB — BASIC METABOLIC PANEL
Anion gap: 9 (ref 5–15)
BUN: 29 mg/dL — ABNORMAL HIGH (ref 6–20)
CHLORIDE: 103 mmol/L (ref 101–111)
CO2: 24 mmol/L (ref 22–32)
Calcium: 9.6 mg/dL (ref 8.9–10.3)
Creatinine, Ser: 1.39 mg/dL — ABNORMAL HIGH (ref 0.61–1.24)
GFR, EST AFRICAN AMERICAN: 57 mL/min — AB (ref >60)
GFR, EST NON AFRICAN AMERICAN: 49 mL/min — AB (ref >60)
Glucose, Bld: 174 mg/dL — ABNORMAL HIGH (ref 65–99)
POTASSIUM: 3.8 mmol/L (ref 3.5–5.1)
SODIUM: 136 mmol/L (ref 135–145)

## 2015-11-28 MED ORDER — ONDANSETRON HCL 4 MG/2ML IJ SOLN
INTRAMUSCULAR | Status: DC
Start: 2015-11-28 — End: 2015-11-29
  Filled 2015-11-28: qty 2

## 2015-11-28 MED ORDER — ONDANSETRON HCL 4 MG/2ML IJ SOLN
4.0000 mg | Freq: Once | INTRAMUSCULAR | Status: AC
  Administered 2015-11-28: 4 mg via INTRAVENOUS

## 2015-11-28 MED ORDER — HYDROMORPHONE HCL 1 MG/ML IJ SOLN
1.0000 mg | Freq: Once | INTRAMUSCULAR | Status: AC
  Administered 2015-11-28: 1 mg via INTRAVENOUS

## 2015-11-28 MED ORDER — PROPOFOL 10 MG/ML IV BOLUS
0.2500 mg/kg | Freq: Once | INTRAVENOUS | Status: DC
  Filled 2015-11-28: qty 20

## 2015-11-28 MED ORDER — ETOMIDATE 2 MG/ML IV SOLN
10.0000 mg | Freq: Once | INTRAVENOUS | Status: AC
  Administered 2015-11-28: 10 mg via INTRAVENOUS
  Filled 2015-11-28: qty 10

## 2015-11-28 MED ORDER — LIDOCAINE HCL 2 % IJ SOLN
10.0000 mL | Freq: Once | INTRAMUSCULAR | Status: AC
  Administered 2015-11-28: 200 mg via INTRADERMAL
  Filled 2015-11-28: qty 20

## 2015-11-28 MED ORDER — HYDROMORPHONE HCL 1 MG/ML IJ SOLN
1.0000 mg | Freq: Once | INTRAMUSCULAR | Status: AC
Start: 2015-11-28 — End: 2015-11-28
  Administered 2015-11-28: 1 mg via INTRAVENOUS
  Filled 2015-11-28: qty 1

## 2015-11-28 MED ORDER — HYDROMORPHONE HCL 1 MG/ML IJ SOLN
INTRAMUSCULAR | Status: DC
Start: 2015-11-28 — End: 2015-11-29
  Filled 2015-11-28: qty 1

## 2015-11-28 MED ORDER — FENTANYL CITRATE (PF) 100 MCG/2ML IJ SOLN
50.0000 ug | Freq: Once | INTRAMUSCULAR | Status: AC
  Administered 2015-11-28: 50 ug via INTRAVENOUS
  Filled 2015-11-28: qty 2

## 2015-11-28 NOTE — ED Notes (Signed)
Pts wife-janice [phone]

## 2015-11-28 NOTE — ED Provider Notes (Addendum)
[facility] DEPT [facility] Provider Note   CSN: [id number] Arrival date & time: 11/28/15  1924  By signing my name below, I, Ephriam Jenkins, attest that this documentation has been prepared under the direction and in the presence of Domenic Moras PA-C.  Electronically Signed: Ephriam Jenkins, ED Scribe. 11/28/15. 7:56 PM.   History   Chief Complaint Chief Complaint  Patient presents with  . Shoulder Pain    HPI HPI Comments: Kevin Hart is a 70 y.o. Male who presents to the Emergency Department complaining of 8/10, constant left shoulder pain s/p an injury that occurred less than one hour ago. Pt was mowing his grass this evening with a self propelled push lawn mower when he accidentally fell onto his left shoulder, striking it on the ground. He denies any feeling of lightheadedness, dizziness, chest pain, or headache before the fall. He denies hitting head or LOC. Pt reports that the pain to his left shoulder is exacerbated upon movement of his left arm. Pt currently taking Eliquis due to Hx of DVT. He denies any numbness or radiating pain from the extremity.   The history is provided by the patient. No language interpreter was used.    Past Medical History:  Diagnosis Date  . Anterior myocardial infarction ([place])   . At risk for sudden cardiac death, EF 25-30% with PVCs Aug 01, 2014  . BPH (benign prostatic hyperplasia)   . CAD (coronary artery disease)    a. prior LAD stenting;  b. 06/2014 NSTEMI/PCI: LM nl, LAD 30p, 68m ISR (3.0x20 Promus DES), LCX nl, RCA nondom, nl, EF 25-30% ant, apical, dist inf AK.  Marland Kitchen Chronic systolic CHF (congestive heart failure) ([place]) 12/31/2014  . DVT (deep venous thrombosis) ([facility])    RECURRENT left leg  . Dyslipidemia   . GERD (gastroesophageal reflux disease)   . Gout   . Ischemic cardiomyopathy 07/06/14   EF 25-30%  . Lupus anticoagulant positive    on coumadin  . PVC's (premature ventricular contractions)   . Rotator cuff tear    R side  . S/P drug  eluting coronary stent placement, 07/06/14, Promus 2014/08/01  . Subarachnoid bleed ([place]) 2015   after falling and hitting his head    Patient Active Problem List   Diagnosis Date Noted  . History of implantable cardioverter-defibrillator (ICD) placement 03/16/2015  . AICD (automatic cardioverter/defibrillator) present   . Chronic systolic CHF (congestive heart failure) ([place]) 12/31/2014  . Chronic systolic dysfunction of left ventricle 11/27/2014  . S/P drug eluting coronary stent placement, mLAD 07/06/14, Promus Aug 01, 2014  . At risk for sudden cardiac death, EF 25-30% with PVCs, with lifevest 08/01/14  . Coronary artery disease involving native coronary artery of native heart with unstable angina pectoris ([place])   . Cardiomyopathy, ischemic   . History of DVT (deep vein thrombosis), on coumadin   . NSTEMI (non-ST elevated myocardial infarction) ([place]) 07/05/2014  . CAD (coronary artery disease)   . DVT (deep venous thrombosis), hx on coumadin   . Dyslipidemia   . GERD (gastroesophageal reflux disease)   . Anterior myocardial infarction ([place])   . Lupus anticoagulant positive     Past Surgical History:  Procedure Laterality Date  . CORONARY ANGIOPLASTY    . EP IMPLANTABLE DEVICE [name] 11/27/2014   St. Jude Medical Fortify Assura VR  implanted by Dr Rayann Heman for primary prevention  . KIDNEY STONE    . LEFT HEART CATHETERIZATION WITH CORONARY ANGIOGRAM [name] 07/06/2014   Procedure: LEFT HEART CATHETERIZATION WITH CORONARY ANGIOGRAM;  Surgeon: Peter M Martinique, MD;  Location: [facility] CATH LAB;  Service: Cardiovascular;  Laterality: [name];  . PERCUTANEOUS CORONARY STENT INTERVENTION (PCI-S) Right 07/06/2014   Procedure: PERCUTANEOUS CORONARY STENT INTERVENTION (PCI-S);  Surgeon: Peter M Martinique, MD;  Location[facility] C[facility] LAB;  Service: Cardiovascular;  Laterality: Right;       Home Medications    Prior to Admission medications   Medication Sig Start Date End Date Taking? Authorizing Provider   allopurinol (ZYLOPRIM) 100 MG tablet Take 100 mg by mouth daily with breakfast.  06/04/14   Historical Provider, MD  atorvastatin (LIPITOR) 80 MG tablet Take 1 tablet (80 mg total) by mouth daily at 6 PM. KEEP OV. 10/21/15   Peter M Martinique, MD  bacitracin ointment Apply 1 application topically 2 (two) times daily. 08/24/15   Waynetta Pean, PA-C  carvedilol (COREG) 6.25 MG tablet Take 1 tablet (6.25 mg total) by mouth 2 (two) times daily. 09/23/15   Peter M Martinique, MD  clopidogrel (PLAVIX) 75 MG tablet Take 1 tablet (75 mg total) by mouth daily. 09/10/15   Peter M Martinique, MD  ELIQUIS 5 MG TABS tablet Take 1 tablet by mouth 2 (two) times daily. 06/10/15   Historical Provider, MD  eplerenone (INSPRA) 25 MG tablet Take 1 tablet (25 mg total) by mouth daily. 09/23/15   Peter M Martinique, MD  furosemide (LASIX) 40 MG tablet Take 40 mg by mouth daily.      Historical Provider, MD  lisinopril (PRINIVIL,ZESTRIL) 2.5 MG tablet TAKE ONE (1) TABLET BY MOUTH EVERY DAY 10/16/15   Peter M Martinique, MD  Multiple Vitamin (MULTI-VITAMINS) TABS Take 1 tablet by mouth daily at 12 noon.    Historical Provider, MD  nitroGLYCERIN (NITROSTAT) 0.4 MG SL tablet Place 1 tablet (0.4 mg total) under the tongue every 5 (five) minutes x 3 doses as needed for chest pain. 10/09/14   Peter M Martinique, MD  pantoprazole (PROTONIX) 40 MG tablet Take 1 tablet (40 mg total) by mouth daily. 07/07/14   Lendon Colonel, NP  Tamsulosin HCl (FLOMAX) 0.4 MG CAPS Take 0.4 mg by mouth daily.     Historical Provider, MD  Vitamin D, Ergocalciferol, (DRISDOL) 50000 UNITS CAPS capsule Take 1 capsule by mouth once a week.     Historical Provider, MD    Family History Family History  Problem Relation Age of Onset  . Aneurysm Father     Social History Social History  Substance Use Topics  . Smoking status: Never Smoker  . Smokeless tobacco: Not on file  . Alcohol use No     Allergies   Review of patient's allergies indicates no known  allergies.   Review of Systems Review of Systems  Respiratory: Negative for shortness of breath.   Cardiovascular: Negative for chest pain.  Musculoskeletal: Positive for arthralgias (right shoulder).  Neurological: Negative for dizziness, syncope, weakness, numbness and headaches.  All other systems reviewed and are negative.  Physical Exam Updated Vital Signs BP 118/76 (BP Location: Right Arm)   Pulse 108   Temp 98 F (36.7 C) (Oral)   Resp 22   Ht 6' (1.829 m)   Wt 240 lb (108.9 kg)   SpO2 94%   BMI 32.55 kg/m   Physical Exam  Constitutional: He is oriented to person, place, and time. He appears well-developed and well-nourished. No distress.  HENT:  Head: Normocephalic and atraumatic.  Neck: Normal range of motion.  Cardiovascular: Normal rate, regular rhythm and intact distal pulses.  Evidence of pacemaker on left chest  Pulmonary/Chest: Effort normal and breath sounds normal. He has no wheezes.  Musculoskeletal:  Left elbow non-tender. Radial pulses 2+. Normal grip strength. No scalp tenderness. Left clavicle is stable. Obvious deformity noted to left shoulder with diffuse tenderness to palpation. Sensation intact. No midline c-spine tenderness.  Neurological: He is alert and oriented to person, place, and time.  Skin: Skin is warm and dry. He is not diaphoretic.  Psychiatric: He has a normal mood and affect. Judgment normal.  Nursing note and vitals reviewed.  ED Treatments / Results  DIAGNOSTIC STUDIES: Oxygen Saturation is 94% on RA, adequate by my interpretation.  COORDINATION OF CARE: 7:50 PM-Will order imaging and medication for pain control. Discussed treatment plan with pt at bedside and pt agreed to plan.   Labs (all labs ordered are listed, but only abnormal results are displayed) Labs Reviewed  BASIC METABOLIC PANEL - Abnormal; Notable for the following:       Result Value   Glucose, Bld 174 (*)    BUN 29 (*)    Creatinine, Ser 1.39 (*)    GFR  calc non Af Amer 49 (*)    GFR calc Af Amer 57 (*)    All other components within normal limits  CBC WITH DIFFERENTIAL/PLATELET - Abnormal; Notable for the following:    WBC 11.3 (*)    Neutro Abs 8.4 (*)    All other components within normal limits  TROPONIN I  TROPONIN I    EKG  EKG Interpretation  Date/Time:  Thursday November 28 2015 20:35:36 EDT Ventricular Rate:  105 PR Interval:    QRS Duration: 86 QT Interval:  338 QTC Calculation: 447 R Axis:   -4 Text Interpretation:  Sinus tachycardia Prolonged PR interval Abnormal lateral Q waves Anterior infarct, old Baseline wander in lead(s) V2 V3 mild ST elevation V1-V3, noted on prior EKG, but slightly more prominent. Confirmed by BELFI  MD, [place] [phone]) on 11/28/2015 8:39:04 PM       Radiology Dg Shoulder Left  Result Date: 11/28/2015 CLINICAL DATA:  70 year old male with fall and left shoulder pain. EXAM: LEFT SHOULDER - 2+ VIEW COMPARISON:  Chest radiograph dated 10/19/2015 FINDINGS: There is anterior dislocation of the left shoulder. There is irregularity of the inferior aspect of the bony glenoid with apparent cortical discontinuity concerning for a Bankart lesion. The bones are osteopenic. Left pectoral pacemaker device and 8 coronary vascular stents noted. The soft tissues are grossly unremarkable. 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 Electronically Signed   By: Anner Crete M.D.   On: 11/28/2015 20:53   Dg Shoulder Left Port  Result Date: 11/28/2015 CLINICAL DATA:  Shoulder dislocation, status postreduction attempt. EXAM: LEFT SHOULDER - 1 VIEW COMPARISON:  11/28/2015 at 8:21 p.m. FINDINGS: The right humeral head remains anteriorly dislocated with respect to the glenoid. Coronary artery stent noted.  AICD noted. IMPRESSION: 1. Unsuccessful reduction -right humeral head remains anteriorly dislocated. Electronically Signed   By: Van Clines M.D.   On: 11/28/2015 23:09    Procedures Reduction of dislocation Date/Time: 11/28/2015 10:32 PM Performed by: Domenic Moras Authorized by: Domenic Moras  Consent: Verbal consent obtained. Risks and benefits: risks, benefits and alternatives were discussed Consent given by: patient Patient understanding: patient states understanding of the procedure being performed Patient consent: the patient's understanding of the procedure matches consent given Procedure consent: procedure consent matches procedure scheduled Imaging  studies: imaging studies available Required items: required blood products, implants, devices, and special equipment available Patient identity confirmed: verbally with patient and arm band Time out: Immediately prior to procedure a "time out" was called to verify the correct patient, procedure, equipment, support staff and site/side marked as required. Local anesthesia used: yes Anesthesia: local infiltration  Anesthesia: Local anesthesia used: yes Local Anesthetic: lidocaine 1% without epinephrine Anesthetic total: 10 mL  Sedation: Patient sedated: yes Sedatives: etomidate Sedation start date/time: 11/28/2015 10:22 PM Sedation end date/time: 11/28/2015 10:34 PM Vitals: Vital signs were monitored during sedation. Patient tolerance: Patient tolerated the procedure well with no immediate complications Comments: Attempted to reduce L shoulder using traction, external rotation with scapula manipulation.  Unsuccessful reduction attempt.  Procedure perform with direct supervision of DR. Belfi    (including critical care time)    Medications Ordered in ED Medications  ondansetron (ZOFRAN) 4 MG/2ML injection (not administered)  HYDROmorphone (DILAUDID) injection 1 mg (1 mg Intravenous Given 11/28/15 1956)  lidocaine (XYLOCAINE) 2 % (with pres) injection 200 mg (200 mg Intradermal Given by Other 11/28/15 2106)  HYDROmorphone (DILAUDID) injection 1 mg (1 mg  Intravenous Given 11/28/15 2106)  ondansetron (ZOFRAN) injection 4 mg (4 mg Intravenous Given 11/28/15 2142)  etomidate (AMIDATE) injection 10 mg (10 mg Intravenous Given 11/28/15 2219)     Initial Impression / Assessment and Plan / ED Course  I have reviewed the triage vital signs and the nursing notes.  Pertinent labs & imaging results that were available during my care of the patient were reviewed by me and considered in my medical decision making (see chart for details).  Clinical Course    BP 138/95 (BP Location: Right Arm)   Pulse 113   Temp 97.7 F (36.5 C) (Oral)   Resp 18   Ht 6' (1.829 m)   Wt 108.9 kg   SpO2 95%   BMI 32.55 kg/m    Final Clinical Impressions(s) / ED Diagnoses   Final diagnoses:  Anterior dislocation of left shoulder, initial encounter    New Prescriptions New Prescriptions   No medications on file  I personally performed the services described in this documentation, which was scribed in my presence. The recorded information has been reviewed and is accurate.     10:28 PM Pt here with L shoulder dislocation after a fall while mowing.  He felt he was turning the corner when he fell, but unsure if he has any precipitating sxs prior to the fall.  Pt believes he may have felt lightheadedness.  Denies heart palpitation or CP.  He does have a defribilator.  He's on blood thinner med due to hx of DVT.  We have attempted to interrogate his St. Jude device without success (likely from our interrogating machine).  Dr. Tamera Punt and myself have attempt to relocate his shoulder by scapula manipulation as well as with procedural sedation without success.  Will consult orthopedist for further management   11:10 PM Appreciate consultation from [place] Dr. Tamera Punt who request pt to be transfer to [facility] ER.  He request to be contact via phone once pt is at [facility] and ready for sedation.  Will consult provider at [facility].    11:15 PM Dr. Tamera Punt now request pt to  be transfer directly to the operating room.  Will transfer patient.  Care discussed with Dr. Tamera Punt.  Pt is aware of plan.    11:50 PM After further discussion with Dr. Tamera Punt, we felt pt will need to  be admitted for obs given the situation of his fall.  He may have experienced lightheadedness/dizziness upon falling. We have yet to interrogate his pace maker.  He does have mild changes to the anterior leads on his EKG.  Will consult medicine for admission and will notify Dr. Tamera Punt of the status.    12:12 AM Appreciate consultation with [facility] Dr. Blaine Hamper who acknowledge pt will need to have his pacemaker interrogate. Dr. Blaine Hamper will contact Dr. Tamera Punt once pt is at [facility]. Pt has negative delta troponin.  No CP.  Pt is schedule to have reduction of his L shoulder in the OR tonight.    Domenic Moras, PA-C 11/28/15 [place], MD 11/28/15 [place], MD 11/28/15 [id number]    Domenic Moras, PA-C 11/29/15 [place], MD 11/29/15 1511

## 2015-11-28 NOTE — ED Triage Notes (Signed)
pt reports while cutting grass he turned and fell and now has left shoulder with deformity

## 2015-11-29 ENCOUNTER — Ambulatory Visit (HOSPITAL_COMMUNITY)
Admission: RE | Admit: 2015-11-29 | Discharge: 2015-11-29 | Disposition: A | Discharge status: Home / Self Care | Payer: PPO | Source: Ambulatory Visit | Attending: Orthopedic Surgery | Admitting: Orthopedic Surgery

## 2015-11-29 ENCOUNTER — Encounter (HOSPITAL_COMMUNITY)
Admission: EM | Disposition: A | Discharge status: Home / Self Care | Payer: Self-pay | Source: Home / Self Care | Attending: Emergency Medicine

## 2015-11-29 ENCOUNTER — Observation Stay (HOSPITAL_COMMUNITY): Payer: PPO | Admitting: Anesthesiology

## 2015-11-29 DIAGNOSIS — I255 Ischemic cardiomyopathy: Secondary | ICD-10-CM | POA: Diagnosis not present

## 2015-11-29 DIAGNOSIS — W19XXXA Unspecified fall, initial encounter: Secondary | ICD-10-CM | POA: Diagnosis not present

## 2015-11-29 DIAGNOSIS — I2510 Atherosclerotic heart disease of native coronary artery without angina pectoris: Secondary | ICD-10-CM | POA: Diagnosis not present

## 2015-11-29 DIAGNOSIS — I252 Old myocardial infarction: Secondary | ICD-10-CM | POA: Diagnosis not present

## 2015-11-29 DIAGNOSIS — S42142A Displaced fracture of glenoid cavity of scapula, left shoulder, initial encounter for closed fracture: Secondary | ICD-10-CM | POA: Diagnosis not present

## 2015-11-29 DIAGNOSIS — N182 Chronic kidney disease, stage 2 (mild): Secondary | ICD-10-CM | POA: Diagnosis not present

## 2015-11-29 DIAGNOSIS — N400 Enlarged prostate without lower urinary tract symptoms: Secondary | ICD-10-CM | POA: Diagnosis not present

## 2015-11-29 DIAGNOSIS — S43015A Anterior dislocation of left humerus, initial encounter: Secondary | ICD-10-CM | POA: Diagnosis present

## 2015-11-29 DIAGNOSIS — S43006A Unspecified dislocation of unspecified shoulder joint, initial encounter: Secondary | ICD-10-CM | POA: Diagnosis not present

## 2015-11-29 DIAGNOSIS — S43085A Other dislocation of left shoulder joint, initial encounter: Secondary | ICD-10-CM | POA: Diagnosis not present

## 2015-11-29 DIAGNOSIS — I5022 Chronic systolic (congestive) heart failure: Secondary | ICD-10-CM | POA: Diagnosis not present

## 2015-11-29 DIAGNOSIS — K219 Gastro-esophageal reflux disease without esophagitis: Secondary | ICD-10-CM | POA: Diagnosis not present

## 2015-11-29 DIAGNOSIS — Z95810 Presence of automatic (implantable) cardiac defibrillator: Secondary | ICD-10-CM | POA: Diagnosis not present

## 2015-11-29 DIAGNOSIS — M109 Gout, unspecified: Secondary | ICD-10-CM | POA: Diagnosis not present

## 2015-11-29 DIAGNOSIS — E785 Hyperlipidemia, unspecified: Secondary | ICD-10-CM | POA: Diagnosis not present

## 2015-11-29 HISTORY — PX: SHOULDER CLOSED REDUCTION: SHX1051

## 2015-11-29 SURGERY — CLOSED REDUCTION, SHOULDER
Anesthesia: General | Site: Shoulder | Laterality: Left

## 2015-11-29 MED ORDER — LACTATED RINGERS IV SOLN
INTRAVENOUS | Status: DC | PRN
  Administered 2015-11-29: 01:00:00 via INTRAVENOUS

## 2015-11-29 MED ORDER — ETOMIDATE 2 MG/ML IV SOLN
INTRAVENOUS | Status: DC | PRN
  Administered 2015-11-29: 20 mg via INTRAVENOUS

## 2015-11-29 MED ORDER — FENTANYL CITRATE (PF) 100 MCG/2ML IJ SOLN
INTRAMUSCULAR | Status: DC | PRN
  Administered 2015-11-29 (×2): 50 ug via INTRAVENOUS

## 2015-11-29 MED ORDER — LIDOCAINE HCL (CARDIAC) 20 MG/ML IV SOLN
INTRAVENOUS | Status: DC | PRN
  Administered 2015-11-29: 60 mg via INTRAVENOUS

## 2015-11-29 MED ORDER — HYDROCODONE-ACETAMINOPHEN 5-325 MG PO TABS: 1.0000 | ORAL_TABLET | ORAL | 0 refills | Status: DC | PRN

## 2015-11-29 MED ORDER — ONDANSETRON HCL 4 MG/2ML IJ SOLN
INTRAMUSCULAR | Status: DC | PRN
  Administered 2015-11-29: 4 mg via INTRAVENOUS

## 2015-11-29 MED ORDER — SUCCINYLCHOLINE CHLORIDE 20 MG/ML IJ SOLN
INTRAMUSCULAR | Status: DC | PRN
  Administered 2015-11-29: 120 mg via INTRAVENOUS

## 2015-11-29 MED ORDER — MIDAZOLAM HCL 5 MG/5ML IJ SOLN
INTRAMUSCULAR | Status: DC | PRN
  Administered 2015-11-29: 2 mg via INTRAVENOUS

## 2015-11-29 NOTE — Discharge Summary (Signed)
Patient ID: [name] MRN: [id number] DOB/AGE: 1945-11-16 70 y.o.  Admit date: 11/28/2015 Discharge date: 11/29/2015  Admission Diagnoses:  Active Problems:   Anterior dislocation of left shoulder   Discharge Diagnoses:  Same  Past Medical History:  Diagnosis Date  . Anterior myocardial infarction ([place])   . At risk for sudden cardiac death, EF 25-30% with PVCs 2014/07/13  . BPH (benign prostatic hyperplasia)   . CAD (coronary artery disease)    a. prior LAD stenting;  b. 06/2014 NSTEMI/PCI: LM nl, LAD 30p, 11m ISR (3.0x20 Promus DES), LCX nl, RCA nondom, nl, EF 25-30% ant, apical, dist inf AK.  Marland Kitchen Chronic systolic CHF (congestive heart failure) ([place]) 12/31/2014  . DVT (deep venous thrombosis) ([facility])    RECURRENT left leg  . Dyslipidemia   . GERD (gastroesophageal reflux disease)   . Gout   . Ischemic cardiomyopathy 07/06/14   EF 25-30%  . Lupus anticoagulant positive    on coumadin  . PVC's (premature ventricular contractions)   . Rotator cuff tear    R side  . S/P drug eluting coronary stent placement, 07/06/14, Promus 07/13/2014  . Subarachnoid bleed ([place]) 2015   after falling and hitting his head    Surgeries: Procedure(s): CLOSED REDUCTION SHOULDER on 11/28/2015 - 11/29/2015   Consultants:   Discharged Condition: Improved  Hospital Course: Kevin Hart is an 70 y.o. male who was admitted 11/28/2015 for operative treatment of anterior L shoulder dislocation. Unable to reduce at outside hospital. The patient was taken to the operating room on 11/28/2015 - 11/29/2015 and underwent  Procedure(s): CLOSED REDUCTION SHOULDER.    Patient was given perioperative antibiotics: Anti-infectives    None       Patient was given sequential compression devices, early ambulation, and chemoprophylaxis to prevent DVT.  Patient benefited maximally from hospital stay and there were no complications.    Recent vital signs: Patient Vitals for the past 24 hrs:  BP Temp Temp src Pulse Resp  SpO2 Height Weight  11/29/15 0217 (!) 140/99 98 F (36.7 C) - (!) 116 17 94 % - -  11/29/15 0210 (!) 139/102 - - (!) 116 15 95 % - -  11/29/15 0200 (!) 145/106 - - (!) 125 17 98 % - -  11/29/15 0154 (!) 148/86 98.4 F (36.9 C) - (!) 122 17 98 % - -  11/28/15 2338 144/93 - - 112 18 95 % - -  11/28/15 2254 138/95 97.7 F (36.5 C) Oral 113 18 95 % - -  11/28/15 2239 129/64 - - 116 18 96 % - -  11/28/15 2234 136/92 - - 112 18 96 % - -  11/28/15 2229 140/94 - - 118 18 94 % - -  11/28/15 2223 106/92 - - 115 22 92 % - -  11/28/15 2209 135/88 97.7 F (36.5 C) Oral 109 20 95 % - -  11/28/15 2024 149/76 98.1 F (36.7 C) Oral 66 16 100 % - -  11/28/15 1933 118/76 98 F (36.7 C) Oral 108 22 94 % 6' (1.829 m) 108.9 kg (240 lb)     Recent laboratory studies:  Recent Labs  11/28/15 2050  WBC 11.3*  HGB 14.9  HCT 42.4  PLT 206  NA 136  K 3.8  CL 103  CO2 24  BUN 29*  CREATININE 1.39*  GLUCOSE 174*  CALCIUM 9.6     Discharge Medications:     Medication List    TAKE these medications  allopurinol 100 MG tablet Commonly known as:  ZYLOPRIM Take 100 mg by mouth daily with breakfast.   atorvastatin 80 MG tablet Commonly known as:  LIPITOR Take 1 tablet (80 mg total) by mouth daily at 6 PM. KEEP OV.   bacitracin ointment Apply 1 application topically 2 (two) times daily.   carvedilol 6.25 MG tablet Commonly known as:  COREG Take 1 tablet (6.25 mg total) by mouth 2 (two) times daily.   clopidogrel 75 MG tablet Commonly known as:  PLAVIX Take 1 tablet (75 mg total) by mouth daily.   ELIQUIS 5 MG Tabs tablet Generic drug:  apixaban Take 1 tablet by mouth 2 (two) times daily.   eplerenone 25 MG tablet Commonly known as:  INSPRA Take 1 tablet (25 mg total) by mouth daily.   FLOMAX 0.4 MG Caps capsule Generic drug:  tamsulosin Take 0.4 mg by mouth daily.   furosemide 40 MG tablet Commonly known as:  LASIX Take 40 mg by mouth daily.   HYDROcodone-acetaminophen  5-325 MG tablet Commonly known as:  NORCO Take 1-2 tablets by mouth every 4 (four) hours as needed for moderate pain.   lisinopril 2.5 MG tablet Commonly known as:  PRINIVIL,ZESTRIL TAKE ONE (1) TABLET BY MOUTH EVERY DAY   MULTI-VITAMINS Tabs Take 1 tablet by mouth daily at 12 noon.   nitroGLYCERIN 0.4 MG SL tablet Commonly known as:  NITROSTAT Place 1 tablet (0.4 mg total) under the tongue every 5 (five) minutes x 3 doses as needed for chest pain.   pantoprazole 40 MG tablet Commonly known as:  PROTONIX Take 1 tablet (40 mg total) by mouth daily.   Vitamin D (Ergocalciferol) 50000 units Caps capsule Commonly known as:  DRISDOL Take 1 capsule by mouth once a week.       Diagnostic Studies: Dg Shoulder Left  Result Date: 11/28/2015 CLINICAL DATA:  70 year old male with fall and left shoulder pain. EXAM: LEFT SHOULDER - 2+ VIEW COMPARISON:  Chest radiograph dated 10/19/2015 FINDINGS: There is anterior dislocation of the left shoulder. There is irregularity of the inferior aspect of the bony glenoid with apparent cortical discontinuity concerning for a Bankart lesion. The bones are osteopenic. Left pectoral pacemaker device and 8 coronary vascular stents noted. The soft tissues are grossly unremarkable. 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 Electronically Signed   By: Anner Crete M.D.   On: 11/28/2015 20:53   Dg Shoulder Left Port  Result Date: 11/28/2015 CLINICAL DATA:  Shoulder dislocation, status postreduction attempt. EXAM: LEFT SHOULDER - 1 VIEW COMPARISON:  11/28/2015 at 8:21 p.m. FINDINGS: The right humeral head remains anteriorly dislocated with respect to the glenoid. Coronary artery stent noted.  AICD noted. IMPRESSION: 1. Unsuccessful reduction -right humeral head remains anteriorly dislocated. Electronically Signed   By: Van Clines M.D.   On: 11/28/2015 23:09    Disposition:  01-Home or Self Care  Discharge Instructions    Call MD / Call 911    Complete by:  As directed   If you experience chest pain or shortness of breath, CALL 911 and be transported to the hospital emergency room.  If you develope a fever above 101 F, pus (white drainage) or increased drainage or redness at the wound, or calf pain, call your surgeon's office.   Constipation Prevention    Complete by:  As directed   Drink plenty of fluids.  Prune juice may be helpful.  You may  use a stool softener, such as Colace (over the counter) 100 mg twice a day.  Use MiraLax (over the counter) for constipation as needed.   Diet - low sodium heart healthy    Complete by:  As directed   Driving restrictions    Complete by:  As directed   No driving   Increase activity slowly as tolerated    Complete by:  As directed      Follow-up Information    Nita Sells, MD On 12/02/2015.   Specialty:  Orthopedic Surgery Contact information: [address] [place] [address] [phone]            Signed: Nita Sells 11/29/2015, 7:08 AM

## 2015-11-29 NOTE — Anesthesia Postprocedure Evaluation (Signed)
Anesthesia Post Note  Patient: Kevin Hart  Procedure(s) Performed: Procedure(s) (LRB): CLOSED REDUCTION SHOULDER (Left)  Patient location during evaluation: PACU Anesthesia Type: General Level of consciousness: sedated Pain management: pain level controlled Vital Signs Assessment: post-procedure vital signs reviewed and stable Respiratory status: spontaneous breathing and respiratory function stable Cardiovascular status: stable Anesthetic complications: no    Last Vitals:  Vitals:   11/29/15 0210 11/29/15 0217  BP: (!) 139/102 (!) 140/99  Pulse: (!) 116 (!) 116  Resp: 15 17  Temp:  36.7 C    Last Pain:  Vitals:   11/28/15 2350  TempSrc:   PainSc: 4                  Shashank Kwasnik DANIEL

## 2015-11-29 NOTE — Progress Notes (Signed)
This is a no charge note  Transfer from [facility] per PA, Gertie Fey  70 year old male with a past medical history of hyperlipidemia, GERD, gout, SAH, CAD, s/p of stent, DVT on Eliquis, CHF with EF 30-35%, s/p of pacemaker, BPH, CKD-II, who presents with left shoulder pain after fall. Patient was found to have a left shoulder dislocation. ED physician attempted reduction without success.   Patient was found to have a negative troponin 2, WBC 11.3, temperature normal, has tachycardia, slightly worsening renal function. Orthopedic surgeon, Dr. Graciella Belton was consulted-->will take pt to OR. Please inform Dr. Tamera Punt at pt's arrival. Pt is accepted to tele bed for obs.  Ivor Costa, MD  [facility] Pager [phone]  If 7PM-7AM, please contact night-coverage [email or web address] Password [email or web address] 11/29/2015, 12:17 AM

## 2015-11-29 NOTE — Anesthesia Procedure Notes (Signed)
Procedure Name: Intubation Date/Time: 11/29/2015 1:39 AM Performed by: Avonlea Sima S Pre-anesthesia Checklist: Patient identified, Emergency Drugs available, Suction available, Timeout performed and Patient being monitored Patient Re-evaluated:Patient Re-evaluated prior to inductionOxygen Delivery Method: Circle system utilized Preoxygenation: Pre-oxygenation with 100% oxygen Intubation Type: IV induction, Cricoid Pressure applied and Rapid sequence Laryngoscope Size: Mac and 4 Grade View: Grade I Tube type: Oral Tube size: 7.5 mm Number of attempts: 1 Airway Equipment and Method: Stylet Placement Confirmation: ETT inserted through vocal cords under direct vision,  positive ETCO2 and breath sounds checked- equal and bilateral Secured at: 21 cm Tube secured with: Tape Dental Injury: Teeth and Oropharynx as per pre-operative assessment

## 2015-11-29 NOTE — H&P (Signed)
Kevin Hart is an 70 y.o. male.   Chief Complaint: L shoulder dislocation HPI: s/p fall mowing lawn with L shoulder dislocation.  Unable to reduce in [facility] ED.    Past Medical History:  Diagnosis Date  . Anterior myocardial infarction ([place])   . At risk for sudden cardiac death, EF 25-30% with PVCs Jul 25, 2014  . BPH (benign prostatic hyperplasia)   . CAD (coronary artery disease)    a. prior LAD stenting;  b. 06/2014 NSTEMI/PCI: LM nl, LAD 30p, 69mISR (3.0x20 Promus DES), LCX nl, RCA nondom, nl, EF 25-30% ant, apical, dist inf AK.  .Marland KitchenChronic systolic CHF (congestive heart failure) ([place] 12/31/2014  . DVT (deep venous thrombosis) ([facility])    RECURRENT left leg  . Dyslipidemia   . GERD (gastroesophageal reflux disease)   . Gout   . Ischemic cardiomyopathy 07/06/14   EF 25-30%  . Lupus anticoagulant positive    on coumadin  . PVC's (premature ventricular contractions)   . Rotator cuff tear    R side  . S/P drug eluting coronary stent placement, 07/06/14, Promus 42016-05-04 . [place]achnoid [place]   after falling and hitting his head    Past Surgical History:  Procedure Laterality Date  . CORONARY ANGIOPLASTY    . EP IMPLANTABLE DEVICE [name] 11/27/2014   [employer] Fortify Assura VR  implanted by Dr ARayann Hemanfor primary prevention  . KIDNEY STONE    . LEFT HEART CATHETERIZATION WITH CORONARY ANGIOGRAM [name] 07/06/2014   Procedure: LEFT HEART CATHETERIZATION WITH CORONARY ANGIOGRAM;  Surgeon: Peter M JMartinique MD;  [facility];  Service: Cardiovascular;  Laterality: [name];  . PERCUTANEOUS CORONARY STENT INTERVENTION (PCI-S) Right 07/06/2014   Procedure: PERCUTANEOUS CORONARY STENT INTERVENTION (PCI-S);  Surgeon: Peter M JMartinique MD;  [facility];  Service: Cardiovascular;  Laterality: Right;    Family History  Problem Relation Age of Onset  . Aneurysm Father    Social History:  reports that he has never smoked. He does not have any smokeless tobacco history  on file. He reports that he does not drink alcohol or use drugs.  Allergies: No Known Allergies  Medications Prior to Admission  Medication Sig Dispense Refill  . allopurinol (ZYLOPRIM) 100 MG tablet Take 100 mg by mouth daily with breakfast.     . atorvastatin (LIPITOR) 80 MG tablet Take 1 tablet (80 mg total) by mouth daily at 6 PM. KEEP OV. 30 tablet 1  . bacitracin ointment Apply 1 application topically 2 (two) times daily. 15 g 0  . carvedilol (COREG) 6.25 MG tablet Take 1 tablet (6.25 mg total) by mouth 2 (two) times daily. 180 tablet 1  . clopidogrel (PLAVIX) 75 MG tablet Take 1 tablet (75 mg total) by mouth daily. 30 tablet 10  . ELIQUIS 5 MG TABS tablet Take 1 tablet by mouth 2 (two) times daily.    .Marland Kitcheneplerenone (INSPRA) 25 MG tablet Take 1 tablet (25 mg total) by mouth daily. 30 tablet 3  . furosemide (LASIX) 40 MG tablet Take 40 mg by mouth daily.      .Marland Kitchenlisinopril (PRINIVIL,ZESTRIL) 2.5 MG tablet TAKE ONE (1) TABLET BY MOUTH EVERY DAY 30 tablet 5  . Multiple Vitamin (MULTI-VITAMINS) TABS Take 1 tablet by mouth daily at 12 noon.    . nitroGLYCERIN (NITROSTAT) 0.4 MG SL tablet Place 1 tablet (0.4 mg total) under the tongue every 5 (five) minutes x 3 doses as needed for chest pain.  25 tablet 11  . pantoprazole (PROTONIX) 40 MG tablet Take 1 tablet (40 mg total) by mouth daily. 30 tablet 2  . Tamsulosin HCl (FLOMAX) 0.4 MG CAPS Take 0.4 mg by mouth daily.     . Vitamin D, Ergocalciferol, (DRISDOL) 50000 UNITS CAPS capsule Take 1 capsule by mouth once a week.       Results for orders placed or performed during the hospital encounter of 11/28/15 (from the past 48 hour(s))  Basic metabolic panel     Status: Abnormal   Collection Time: 11/28/15  8:50 PM  Result Value Ref Range   Sodium 136 135 - 145 mmol/L   Potassium 3.8 3.5 - 5.1 mmol/L   Chloride 103 101 - 111 mmol/L   CO2 24 22 - 32 mmol/L   Glucose, Bld 174 (H) 65 - 99 mg/dL   BUN 29 (H) 6 - 20 mg/dL   Creatinine, Ser 1.39  (H) 0.61 - 1.24 mg/dL   Calcium 9.6 8.9 - 10.3 mg/dL   GFR calc non Af Amer 49 (L) >60 mL/min   GFR calc Af Amer 57 (L) >60 mL/min    Comment: (NOTE) The eGFR has been calculated using the CKD EPI equation. This calculation has not been validated in all clinical situations. eGFR's persistently <60 mL/min signify possible Chronic Kidney Disease.    Anion gap 9 5 - 15  Troponin I     Status: None   Collection Time: 11/28/15  8:50 PM  Result Value Ref Range   Troponin I <0.03 <0.03 ng/mL  CBC with Differential/Platelet     Status: Abnormal   Collection Time: 11/28/15  8:50 PM  Result Value Ref Range   WBC 11.3 (H) 4.0 - 10.5 K/uL   RBC 4.78 4.22 - 5.81 MIL/uL   Hemoglobin 14.9 13.0 - 17.0 g/dL   HCT 42.4 39.0 - 52.0 %   MCV 88.7 78.0 - 100.0 fL   MCH 31.2 26.0 - 34.0 pg   MCHC 35.1 30.0 - 36.0 g/dL   RDW 14.4 11.5 - 15.5 %   Platelets 206 150 - 400 K/uL   Neutrophils Relative % 74 %   Neutro Abs 8.4 (H) 1.7 - 7.7 K/uL   Lymphocytes Relative 16 %   Lymphs Abs 1.8 0.7 - 4.0 K/uL   Monocytes Relative 8 %   Monocytes Absolute 0.9 0.1 - 1.0 K/uL   Eosinophils Relative 1 %   Eosinophils Absolute 0.2 0.0 - 0.7 K/uL   Basophils Relative 1 %   Basophils Absolute 0.1 0.0 - 0.1 K/uL  Troponin I     Status: None   Collection Time: 11/28/15 11:20 PM  Result Value Ref Range   Troponin I <0.03 <0.03 ng/mL   Dg Shoulder Left  Result Date: 11/28/2015 CLINICAL DATA:  70 year old male with fall and left shoulder pain. EXAM: LEFT SHOULDER - 2+ VIEW COMPARISON:  Chest radiograph dated 10/19/2015 FINDINGS: There is anterior dislocation of the left shoulder. There is irregularity of the inferior aspect of the bony glenoid with apparent cortical discontinuity concerning for a Bankart lesion. The bones are osteopenic. Left pectoral pacemaker device and 8 coronary vascular stents noted. The soft tissues are grossly unremarkable. IMPRESSION: Anterior dislocation of the shoulder with findings concerning  for a Bankart lesion from the inferior bony glenoid. Electronically Signed   By: Anner Crete M.D.   On: 11/28/2015 20:53   Dg Shoulder Left Port  Result Date: 11/28/2015 CLINICAL DATA:  Shoulder dislocation, status postreduction attempt. EXAM:  LEFT SHOULDER - 1 VIEW COMPARISON:  11/28/2015 at 8:21 p.m. FINDINGS: The right humeral head remains anteriorly dislocated with respect to the glenoid. Coronary artery stent noted.  AICD noted. IMPRESSION: 1. Unsuccessful reduction -right humeral head remains anteriorly dislocated. Electronically Signed   By: Van Clines M.D.   On: 11/28/2015 23:09    Review of Systems  All other systems reviewed and are negative.   Blood pressure 144/93, pulse 112, temperature 97.7 F (36.5 C), temperature source Oral, resp. rate 18, height 6' (1.829 m), weight 108.9 kg (240 lb), SpO2 95 %. Physical Exam  Constitutional: He is oriented to person, place, and time. He appears well-developed and well-nourished.  HENT:  Head: Atraumatic.  Eyes: EOM are normal.  Cardiovascular: Intact distal pulses.   Respiratory: Effort normal.  Musculoskeletal:  L shoulder pain with motion  Neurological: He is alert and oriented to person, place, and time.  Skin: Skin is warm and dry.  Psychiatric: He has a normal mood and affect.     Assessment/Plan L shoulder anterior dislocation Plan closed reduction with anesthesia Risks / benefits of surgery discussed Consent on chart  NPO for OR    Nita Sells, MD 11/29/2015, 1:35 AM

## 2015-11-29 NOTE — Op Note (Addendum)
Procedure(s): CLOSED REDUCTION SHOULDER Procedure Note  Kevin Hart male 70 y.o. 11/29/2015  Procedure(s) and Anesthesia Type:   #1 CLOSED REDUCTION LEFT SHOULDER DISLOCATION- General #2 CLOSED TREATMENT SCAPULA FRACTURE (GLENOID) WITH MANIP      Surgeon: Nita Sells   Assistants: none  Anesthesia: General endotracheal anesthesia     Procedure Detail  CLOSED REDUCTION SHOULDER  Estimated Blood Loss:  none         Drains: none  Blood Given: none         Specimens: none        Complications:  * No complications entered in OR log *         Disposition: PACU - hemodynamically stable.         Condition: stable    Procedure:   INDICATIONS FOR SURGERY: The patient fell while mowing his wound today, suffering an anterior dislocation which was unable to be reduced in the emergency department in [facility]. He was transferred to [facility] for closed reduction the operating room under general anesthesia.  DESCRIPTION OF PROCEDURE: The patient was identified in preoperative  holding area where I personally marked the operative site after  verifying site, side, and procedure with the patient. The patient was taken back  to the operating room where general anesthesia was induced without  Complication.  After adequate anesthesia shoulder was reduced with a combination of external rotation and forward flexion with slight traction. Once in place it felt stable. Fluoroscopic imaging showed concentric reduction of the joint. There is an anterior-inferior glenoid fracture which is in reasonable alignment after reduction. The patient was allowed to awaken from anesthesia transferred to stretcher and taken to the recovery room in stable condition in a sling.   POSTOPERATIVE PLAN: He will be observed in the recovery room and discharged home with his family when stable. He will follow-up with me at which point we will repeat x-rays to further evaluate glenoid  fracture and likely proceed with an MRI to rule out associated rotator cuff injury with this significant dislocation event.

## 2015-11-29 NOTE — Discharge Instructions (Signed)
Keep arm in sling at all times

## 2015-11-29 NOTE — Transfer of Care (Signed)
Immediate Anesthesia Transfer of Care Note  Patient: Kevin Hart  Procedure(s) Performed: Procedure(s): CLOSED REDUCTION SHOULDER (Left)  Patient Location: PACU  Anesthesia Type:General  Level of Consciousness: awake  Airway & Oxygen Therapy: Patient Spontanous Breathing and Patient connected to nasal cannula oxygen  Post-op Assessment: Report given to RN and Post -op Vital signs reviewed and stable  Post vital signs: Reviewed and stable  Last Vitals:  Vitals:   11/28/15 2254 11/28/15 2338  BP: 138/95 144/93  Pulse: 113 112  Resp: 18 18  Temp: 36.5 C     Last Pain:  Vitals:   11/28/15 2350  TempSrc:   PainSc: 4          Complications: No apparent anesthesia complications

## 2015-11-29 NOTE — Anesthesia Preprocedure Evaluation (Addendum)
Anesthesia Evaluation  Patient identified by MRN, date of birth, ID band Patient awake    Reviewed: Allergy & Precautions, Patient's Chart, lab work & pertinent test results  History of Anesthesia Complications Negative for: history of anesthetic complications  Airway Mallampati: II  TM Distance: >3 FB Neck ROM: Full    Dental  (+) Partial Upper, Partial Lower, Poor Dentition, Dental Advisory Given   Pulmonary neg pulmonary ROS,    Pulmonary exam normal        Cardiovascular + angina + CAD, + Past MI, + Cardiac Stents and +CHF  + Cardiac Defibrillator  Rhythm:Regular Rate:Tachycardia  Study Conclusions  - Left ventricle: There is akinesis of the mid anteroseptal,   anterior, apical septal, anterior, inferior walls and of the true   septum. The cavity size was normal. There was mild concentric   hypertrophy. Systolic function was moderately to severely   reduced. The estimated ejection fraction was in the range of 30%   to 35%. Wall motion was normal   Neuro/Psych negative neurological ROS  negative psych ROS   GI/Hepatic Neg liver ROS, GERD  ,  Endo/Other  negative endocrine ROS  Renal/GU Renal InsufficiencyRenal disease  negative genitourinary   Musculoskeletal negative musculoskeletal ROS (+)   Abdominal   Peds negative pediatric ROS (+)  Hematology negative hematology ROS (+)   Anesthesia Other Findings   Reproductive/Obstetrics negative OB ROS                            Anesthesia Physical Anesthesia Plan  ASA: III  Anesthesia Plan: General   Post-op Pain Management:    Induction: Intravenous, Rapid sequence and Cricoid pressure planned  Airway Management Planned: Oral ETT  Additional Equipment:   Intra-op Plan:   Post-operative Plan: Extubation in OR  Informed Consent: I have reviewed the patients History and Physical, chart, labs and discussed the procedure  including the risks, benefits and alternatives for the proposed anesthesia with the patient or authorized representative who has indicated his/her understanding and acceptance.   Dental advisory given  Plan Discussed with: CRNA, Anesthesiologist and Surgeon  Anesthesia Plan Comments:         Anesthesia Quick Evaluation

## 2015-12-02 ENCOUNTER — Encounter (HOSPITAL_COMMUNITY): Payer: Self-pay | Admitting: Orthopedic Surgery

## 2015-12-02 DIAGNOSIS — M25312 Other instability, left shoulder: Secondary | ICD-10-CM | POA: Diagnosis not present

## 2015-12-16 DIAGNOSIS — M25512 Pain in left shoulder: Secondary | ICD-10-CM | POA: Diagnosis not present

## 2015-12-16 DIAGNOSIS — M25312 Other instability, left shoulder: Secondary | ICD-10-CM | POA: Diagnosis not present

## 2015-12-17 ENCOUNTER — Other Ambulatory Visit: Payer: Self-pay | Admitting: Cardiology

## 2015-12-19 ENCOUNTER — Ambulatory Visit (INDEPENDENT_AMBULATORY_CARE_PROVIDER_SITE_OTHER): Payer: PPO | Admitting: *Deleted

## 2015-12-19 DIAGNOSIS — I5022 Chronic systolic (congestive) heart failure: Secondary | ICD-10-CM

## 2015-12-19 DIAGNOSIS — Z95810 Presence of automatic (implantable) cardiac defibrillator: Secondary | ICD-10-CM | POA: Diagnosis not present

## 2015-12-19 DIAGNOSIS — I255 Ischemic cardiomyopathy: Secondary | ICD-10-CM

## 2015-12-19 NOTE — Progress Notes (Signed)
EPIC Encounter for ICM Monitoring  Patient Name: Kevin Hart is a 70 y.o. male Date: 12/19/2015 Primary Care Physican: Guadlupe Spanish, MD Primary [place] Electrophysiologist: Allred Dry Weight: 240 lb       Heart Failure questions reviewed, pt asymptomatic   Thoracic impedance normal   Recommendations: No changes.  Low sodium diet education provided.    Follow-up plan: ICM clinic phone appointment on 01/21/2016.   Copy of ICM check sent to device physician.   ICM trend: 12/19/2015        Rosalene Billings, RN 12/19/2015 10:31 AM

## 2015-12-19 NOTE — Progress Notes (Signed)
Remote ICD transmission.   

## 2015-12-20 DIAGNOSIS — M25512 Pain in left shoulder: Secondary | ICD-10-CM | POA: Diagnosis not present

## 2015-12-20 DIAGNOSIS — M25312 Other instability, left shoulder: Secondary | ICD-10-CM | POA: Diagnosis not present

## 2015-12-25 ENCOUNTER — Encounter: Payer: Self-pay | Admitting: Cardiology

## 2016-01-08 ENCOUNTER — Encounter: Payer: Self-pay | Admitting: Cardiology

## 2016-01-16 LAB — CUP PACEART REMOTE DEVICE CHECK
Brady Statistic RV Percent Paced: 1 % — CL
Date Time Interrogation Session: 20171026120208
HIGH POWER IMPEDANCE MEASURED VALUE: 64 Ohm
Implantable Lead Implant Date: 20160906
Implantable Lead Location: 753860
Lead Channel Sensing Intrinsic Amplitude: 11.8 mV
Lead Channel Setting Sensing Sensitivity: 0.5 mV
MDC IDC MSMT BATTERY REMAINING LONGEVITY: 92 mo
MDC IDC MSMT LEADCHNL RA IMPEDANCE VALUE: 450 Ohm
MDC IDC SET LEADCHNL RV PACING AMPLITUDE: 2.5 V
MDC IDC SET LEADCHNL RV PACING PULSEWIDTH: 0.5 ms
Pulse Gen Serial Number: 7257239

## 2016-01-21 ENCOUNTER — Ambulatory Visit (INDEPENDENT_AMBULATORY_CARE_PROVIDER_SITE_OTHER): Payer: PPO

## 2016-01-21 DIAGNOSIS — Z95810 Presence of automatic (implantable) cardiac defibrillator: Secondary | ICD-10-CM | POA: Diagnosis not present

## 2016-01-21 DIAGNOSIS — I5022 Chronic systolic (congestive) heart failure: Secondary | ICD-10-CM

## 2016-01-21 NOTE — Progress Notes (Signed)
EPIC Encounter for ICM Monitoring  Patient Name: Kevin Hart is a 70 y.o. male Date: 01/21/2016 Primary Care Physican: Guadlupe Spanish, MD Primary [place] Electrophysiologist: Allred Dry Weight:    240 lb      Heart Failure questions reviewed, pt asymptomatic   Thoracic impedance normal   Recommendations: No changes.  Advised to limit salt intake to 2000 mg daily.  Encouraged to call for fluid symptoms.    Follow-up plan: ICM clinic phone appointment on 02/21/2016.  Copy of ICM check sent to device physician.   ICM trend: 01/21/2016       Rosalene Billings, RN 01/21/2016 2:22 PM

## 2016-01-23 DIAGNOSIS — Z0000 Encounter for general adult medical examination without abnormal findings: Secondary | ICD-10-CM | POA: Diagnosis not present

## 2016-01-23 DIAGNOSIS — I10 Essential (primary) hypertension: Secondary | ICD-10-CM | POA: Diagnosis not present

## 2016-01-23 DIAGNOSIS — Z23 Encounter for immunization: Secondary | ICD-10-CM | POA: Diagnosis not present

## 2016-01-23 DIAGNOSIS — G4730 Sleep apnea, unspecified: Secondary | ICD-10-CM | POA: Diagnosis not present

## 2016-01-24 DIAGNOSIS — G4710 Hypersomnia, unspecified: Secondary | ICD-10-CM | POA: Diagnosis not present

## 2016-01-24 DIAGNOSIS — I509 Heart failure, unspecified: Secondary | ICD-10-CM | POA: Diagnosis not present

## 2016-01-24 DIAGNOSIS — I10 Essential (primary) hypertension: Secondary | ICD-10-CM | POA: Diagnosis not present

## 2016-01-24 DIAGNOSIS — I2510 Atherosclerotic heart disease of native coronary artery without angina pectoris: Secondary | ICD-10-CM | POA: Diagnosis not present

## 2016-01-27 ENCOUNTER — Other Ambulatory Visit: Payer: Self-pay | Admitting: Cardiology

## 2016-01-27 DIAGNOSIS — S42142D Displaced fracture of glenoid cavity of scapula, left shoulder, subsequent encounter for fracture with routine healing: Secondary | ICD-10-CM | POA: Diagnosis not present

## 2016-01-27 DIAGNOSIS — M25312 Other instability, left shoulder: Secondary | ICD-10-CM | POA: Diagnosis not present

## 2016-01-27 DIAGNOSIS — M25512 Pain in left shoulder: Secondary | ICD-10-CM | POA: Diagnosis not present

## 2016-01-27 DIAGNOSIS — S43015D Anterior dislocation of left humerus, subsequent encounter: Secondary | ICD-10-CM | POA: Diagnosis not present

## 2016-01-27 NOTE — Telephone Encounter (Signed)
Rx request sent to pharmacy.  

## 2016-02-10 DIAGNOSIS — G4733 Obstructive sleep apnea (adult) (pediatric): Secondary | ICD-10-CM | POA: Diagnosis not present

## 2016-02-10 DIAGNOSIS — M25312 Other instability, left shoulder: Secondary | ICD-10-CM | POA: Diagnosis not present

## 2016-02-10 DIAGNOSIS — G4723 Circadian rhythm sleep disorder, irregular sleep wake type: Secondary | ICD-10-CM | POA: Diagnosis not present

## 2016-02-10 DIAGNOSIS — S43015D Anterior dislocation of left humerus, subsequent encounter: Secondary | ICD-10-CM | POA: Diagnosis not present

## 2016-02-10 DIAGNOSIS — M25512 Pain in left shoulder: Secondary | ICD-10-CM | POA: Diagnosis not present

## 2016-02-12 DIAGNOSIS — M25512 Pain in left shoulder: Secondary | ICD-10-CM | POA: Diagnosis not present

## 2016-02-12 DIAGNOSIS — M25312 Other instability, left shoulder: Secondary | ICD-10-CM | POA: Diagnosis not present

## 2016-02-12 DIAGNOSIS — S43015D Anterior dislocation of left humerus, subsequent encounter: Secondary | ICD-10-CM | POA: Diagnosis not present

## 2016-02-17 DIAGNOSIS — M25312 Other instability, left shoulder: Secondary | ICD-10-CM | POA: Diagnosis not present

## 2016-02-19 ENCOUNTER — Other Ambulatory Visit: Payer: Self-pay | Admitting: Orthopedic Surgery

## 2016-02-20 ENCOUNTER — Telehealth: Payer: Self-pay | Admitting: Cardiology

## 2016-02-20 NOTE — Telephone Encounter (Signed)
He is cleared for shoulder surgery from a cardiac standpoint. He should stop Plavix 5 days prior to surgery and Eliquis 2 days prior.   Zakaria Sedor Martinique MD, [facility]

## 2016-02-20 NOTE — Telephone Encounter (Signed)
Kevin Hart with [place] calling to check on status of surgical clearance for 03-02-16 OP surgery-pls call [phone] okk to leave message

## 2016-02-21 ENCOUNTER — Telehealth: Payer: Self-pay

## 2016-02-21 ENCOUNTER — Ambulatory Visit (INDEPENDENT_AMBULATORY_CARE_PROVIDER_SITE_OTHER): Payer: PPO

## 2016-02-21 DIAGNOSIS — M25512 Pain in left shoulder: Secondary | ICD-10-CM | POA: Diagnosis not present

## 2016-02-21 DIAGNOSIS — S43015D Anterior dislocation of left humerus, subsequent encounter: Secondary | ICD-10-CM | POA: Diagnosis not present

## 2016-02-21 DIAGNOSIS — Z95810 Presence of automatic (implantable) cardiac defibrillator: Secondary | ICD-10-CM

## 2016-02-21 DIAGNOSIS — M25312 Other instability, left shoulder: Secondary | ICD-10-CM | POA: Diagnosis not present

## 2016-02-21 DIAGNOSIS — I5022 Chronic systolic (congestive) heart failure: Secondary | ICD-10-CM | POA: Diagnosis not present

## 2016-02-21 NOTE — Progress Notes (Signed)
EPIC Encounter for ICM Monitoring  Patient Name: Kevin Hart is a 70 y.o. male Date: 02/21/2016 Primary Care Physican: Guadlupe Spanish, MD Primary [place] Electrophysiologist: Allred Dry Weight:unknown       Attempted ICM call and unable to reach. Left message to return call.  Transmission reviewed.   Thoracic impedance almost at normal today.  Impedance was abnormal suggesting fluid accumulation 01/23/2016 to 02/03/2016 and 02/12/2016 to 02/21/2016.  Recommendations: NONE - unable to reach  Follow-up plan: ICM clinic phone appointment on 03/05/2016 to recheck fluid levels.  Patient scheduled for Repair of rotator cuff on 03/02/2016  Copy of ICM check sent to primary cardiologist and device physician.   ICM trend: 02/21/2016       Rosalene Billings, RN 02/21/2016 10:54 AM

## 2016-02-21 NOTE — Progress Notes (Signed)
Patient returned call from patient.  Current weight 236 lbs.  He stated he could tell he had some fluid in early November but feels in the last couple of days is gone.  Advised to call if he has any fluid symptoms.  He has direct ICM number.  Confirmed shoulder surgery on 03/02/2016.  Advised to follow low salt diet.

## 2016-02-21 NOTE — Telephone Encounter (Signed)
Remote ICM transmission received.  Attempted patient call and left message to return call.   

## 2016-02-24 DIAGNOSIS — G4733 Obstructive sleep apnea (adult) (pediatric): Secondary | ICD-10-CM | POA: Diagnosis not present

## 2016-02-24 DIAGNOSIS — K219 Gastro-esophageal reflux disease without esophagitis: Secondary | ICD-10-CM | POA: Diagnosis not present

## 2016-02-24 DIAGNOSIS — I509 Heart failure, unspecified: Secondary | ICD-10-CM | POA: Diagnosis not present

## 2016-02-24 DIAGNOSIS — I10 Essential (primary) hypertension: Secondary | ICD-10-CM | POA: Diagnosis not present

## 2016-02-24 NOTE — Telephone Encounter (Signed)
Lmtcb-need to give direction for stopping medications before surgery Will fax clearance for shoulder surgery

## 2016-02-24 NOTE — Telephone Encounter (Signed)
Faxed to [facility] [phone]

## 2016-02-24 NOTE — Telephone Encounter (Signed)
Pt notified of medication instructions for surgery

## 2016-02-25 DIAGNOSIS — E7800 Pure hypercholesterolemia, unspecified: Secondary | ICD-10-CM | POA: Diagnosis not present

## 2016-02-25 DIAGNOSIS — M109 Gout, unspecified: Secondary | ICD-10-CM | POA: Diagnosis not present

## 2016-02-25 DIAGNOSIS — E559 Vitamin D deficiency, unspecified: Secondary | ICD-10-CM | POA: Diagnosis not present

## 2016-02-25 DIAGNOSIS — D539 Nutritional anemia, unspecified: Secondary | ICD-10-CM | POA: Diagnosis not present

## 2016-02-25 DIAGNOSIS — R5383 Other fatigue: Secondary | ICD-10-CM | POA: Diagnosis not present

## 2016-02-25 DIAGNOSIS — I10 Essential (primary) hypertension: Secondary | ICD-10-CM | POA: Diagnosis not present

## 2016-02-25 DIAGNOSIS — R300 Dysuria: Secondary | ICD-10-CM | POA: Diagnosis not present

## 2016-02-25 DIAGNOSIS — E291 Testicular hypofunction: Secondary | ICD-10-CM | POA: Diagnosis not present

## 2016-02-26 DIAGNOSIS — H524 Presbyopia: Secondary | ICD-10-CM | POA: Diagnosis not present

## 2016-02-26 DIAGNOSIS — H02839 Dermatochalasis of unspecified eye, unspecified eyelid: Secondary | ICD-10-CM | POA: Diagnosis not present

## 2016-02-26 DIAGNOSIS — H2513 Age-related nuclear cataract, bilateral: Secondary | ICD-10-CM | POA: Diagnosis not present

## 2016-02-26 DIAGNOSIS — H43312 Vitreous membranes and strands, left eye: Secondary | ICD-10-CM | POA: Diagnosis not present

## 2016-02-26 DIAGNOSIS — H18413 Arcus senilis, bilateral: Secondary | ICD-10-CM | POA: Diagnosis not present

## 2016-03-03 ENCOUNTER — Encounter (HOSPITAL_COMMUNITY): Payer: Self-pay

## 2016-03-03 ENCOUNTER — Other Ambulatory Visit: Payer: Self-pay | Admitting: Orthopedic Surgery

## 2016-03-03 ENCOUNTER — Encounter (HOSPITAL_COMMUNITY)
Admission: RE | Admit: 2016-03-03 | Discharge: 2016-03-03 | Disposition: A | Discharge status: Home / Self Care | Payer: PPO | Source: Ambulatory Visit | Attending: Orthopedic Surgery | Admitting: Orthopedic Surgery

## 2016-03-03 DIAGNOSIS — M75102 Unspecified rotator cuff tear or rupture of left shoulder, not specified as traumatic: Secondary | ICD-10-CM

## 2016-03-03 DIAGNOSIS — S46212A Strain of muscle, fascia and tendon of other parts of biceps, left arm, initial encounter: Secondary | ICD-10-CM | POA: Diagnosis not present

## 2016-03-03 DIAGNOSIS — X58XXXA Exposure to other specified factors, initial encounter: Secondary | ICD-10-CM | POA: Diagnosis not present

## 2016-03-03 DIAGNOSIS — I493 Ventricular premature depolarization: Secondary | ICD-10-CM | POA: Diagnosis not present

## 2016-03-03 DIAGNOSIS — M75112 Incomplete rotator cuff tear or rupture of left shoulder, not specified as traumatic: Secondary | ICD-10-CM | POA: Diagnosis not present

## 2016-03-03 DIAGNOSIS — Z95810 Presence of automatic (implantable) cardiac defibrillator: Secondary | ICD-10-CM | POA: Diagnosis not present

## 2016-03-03 DIAGNOSIS — M25612 Stiffness of left shoulder, not elsewhere classified: Secondary | ICD-10-CM | POA: Diagnosis not present

## 2016-03-03 DIAGNOSIS — M109 Gout, unspecified: Secondary | ICD-10-CM | POA: Diagnosis not present

## 2016-03-03 DIAGNOSIS — I252 Old myocardial infarction: Secondary | ICD-10-CM | POA: Diagnosis not present

## 2016-03-03 DIAGNOSIS — D6862 Lupus anticoagulant syndrome: Secondary | ICD-10-CM | POA: Diagnosis not present

## 2016-03-03 DIAGNOSIS — G4730 Sleep apnea, unspecified: Secondary | ICD-10-CM | POA: Diagnosis not present

## 2016-03-03 DIAGNOSIS — I110 Hypertensive heart disease with heart failure: Secondary | ICD-10-CM | POA: Diagnosis not present

## 2016-03-03 DIAGNOSIS — K219 Gastro-esophageal reflux disease without esophagitis: Secondary | ICD-10-CM | POA: Diagnosis not present

## 2016-03-03 DIAGNOSIS — I2510 Atherosclerotic heart disease of native coronary artery without angina pectoris: Secondary | ICD-10-CM | POA: Diagnosis not present

## 2016-03-03 DIAGNOSIS — Z01818 Encounter for other preprocedural examination: Secondary | ICD-10-CM

## 2016-03-03 DIAGNOSIS — E785 Hyperlipidemia, unspecified: Secondary | ICD-10-CM | POA: Diagnosis not present

## 2016-03-03 DIAGNOSIS — Z87442 Personal history of urinary calculi: Secondary | ICD-10-CM | POA: Diagnosis not present

## 2016-03-03 DIAGNOSIS — I255 Ischemic cardiomyopathy: Secondary | ICD-10-CM | POA: Diagnosis not present

## 2016-03-03 DIAGNOSIS — Z955 Presence of coronary angioplasty implant and graft: Secondary | ICD-10-CM | POA: Diagnosis not present

## 2016-03-03 DIAGNOSIS — M1990 Unspecified osteoarthritis, unspecified site: Secondary | ICD-10-CM | POA: Diagnosis not present

## 2016-03-03 DIAGNOSIS — N400 Enlarged prostate without lower urinary tract symptoms: Secondary | ICD-10-CM | POA: Diagnosis not present

## 2016-03-03 DIAGNOSIS — I5022 Chronic systolic (congestive) heart failure: Secondary | ICD-10-CM | POA: Diagnosis not present

## 2016-03-03 DIAGNOSIS — Z7901 Long term (current) use of anticoagulants: Secondary | ICD-10-CM | POA: Diagnosis not present

## 2016-03-03 DIAGNOSIS — Z7902 Long term (current) use of antithrombotics/antiplatelets: Secondary | ICD-10-CM | POA: Diagnosis not present

## 2016-03-03 HISTORY — DX: Unspecified osteoarthritis, unspecified site: M19.90

## 2016-03-03 HISTORY — DX: Personal history of urinary calculi: Z87.442

## 2016-03-03 HISTORY — DX: Unspecified cataract: H26.9

## 2016-03-03 HISTORY — DX: Sleep apnea, unspecified: G47.30

## 2016-03-03 HISTORY — DX: Essential (primary) hypertension: I10

## 2016-03-03 MED ORDER — POVIDONE-IODINE 7.5 % EX SOLN: Freq: Once | CUTANEOUS | Status: DC

## 2016-03-03 NOTE — Pre-Procedure Instructions (Signed)
Kevin Hart  03/03/2016      [facility] - [place], [place] - [address] [place] [address] Phone: [phone] Fax: [phone]  [facility] - [place], [place] - [address] [place] [address] Phone: [phone] Fax: [phone]    Your procedure is scheduled on Thurs, Dec 14 @ 10:00 AM  Report to [facility] Admitting at 8:00 AM  Call this number if you have problems the morning of surgery:  [phone]   Remember:  Do not eat food or drink liquids after midnight.  Take these medicines the morning of surgery with A SIP OF WATER Allopurinol(Zyloprim),Carvedilol(Coreg),and Pain Pill(if needed)             Stop taking your Plavix 5 days prior to surgery. Stop taking your Eliquis 2 days prior to surgery. No Goody's,BC's,Aleve,Advil,Motrin,Fish Oil,or any Herbal Medications.     Do not wear jewelry.  Do not wear lotions, powders,colognes, or deoderant.  Men may shave face and neck.  Do not bring valuables to the hospital.  [facility] is not responsible for any belongings or valuables.  Contacts, dentures or bridgework may not be worn into surgery.  Leave your suitcase in the car.  After surgery it may be brought to your room.  For patients admitted to the hospital, discharge time will be determined by your treatment team.  Patients discharged the day of surgery will not be allowed to drive home.    [facility]  Before surgery, you can play an important role.  Because skin is not sterile, your skin needs to be as free of germs as possible.  You can reduce the number of germs on you skin by washing with CHG (chlorahexidine gluconate) soap before surgery.  CHG is an antiseptic cleaner which kills germs and bonds with the skin to continue killing germs even after washing.  Please DO NOT use if you have an allergy to CHG or antibacterial soaps.  If  your skin becomes reddened/irritated stop using the CHG and inform your nurse when you arrive at Short Stay.  Do not shave (including legs and underarms) for at least 48 hours prior to the first CHG shower.  You may shave your face.  Please follow these instructions carefully:   1.  Shower with CHG Soap the night before surgery and the                                morning of Surgery.  2.  If you choose to wash your hair, wash your hair first as usual with your       normal shampoo.  3.  After you shampoo, rinse your hair and body thoroughly to remove the                      Shampoo.  4.  Use CHG as you would any other liquid soap.  You can apply chg directly       to the skin and wash gently with scrungie or a clean washcloth.  5.  Apply the CHG Soap to your body ONLY FROM THE NECK DOWN.        Do not use on open wounds or open sores.  Avoid contact with your eyes,       ears, mouth  and genitals (private parts).  Wash genitals (private parts)       with your normal soap.  6.  Wash thoroughly, paying special attention to the area where your surgery        will be performed.  7.  Thoroughly rinse your body with warm water from the neck down.  8.  DO NOT shower/wash with your normal soap after using and rinsing off       the CHG Soap.  9.  Pat yourself dry with a clean towel.            10.  Wear clean pajamas.            11.  Place clean sheets on your bed the night of your first shower and do not        sleep with pets.  Day of Surgery  Do not apply any lotions/deoderants the morning of surgery.  Please wear clean clothes to the hospital/surgery center.    Please read over the following fact sheets that you were given. Pain Booklet, Coughing and Deep Breathing and Surgical Site Infection Prevention

## 2016-03-03 NOTE — Progress Notes (Signed)
Notified Windle Guard Rep with [employer] of surgery on 03/05/16 with a start time of 10

## 2016-03-03 NOTE — Progress Notes (Addendum)
Cardiologist is Dr.P Jordan,last visit in epic from 07-05-15.Clearance note in epic from 02-20-16+  Medical Md is Dr.Arvind  Echo report in epic from 2006/2016  Stress test prior to 2000  Heart cath report in epic from 2003/2016  EKG in epic from 11-28-15  CXR in epic from 03-15-15  Labs done on 02/25/16 with Dr.Arvind-to request along with sleep study that was done a couple of months ago

## 2016-03-05 ENCOUNTER — Telehealth: Payer: Self-pay

## 2016-03-05 ENCOUNTER — Ambulatory Visit (INDEPENDENT_AMBULATORY_CARE_PROVIDER_SITE_OTHER): Payer: PPO

## 2016-03-05 ENCOUNTER — Encounter (HOSPITAL_COMMUNITY)
Admission: RE | Disposition: A | Discharge status: Home / Self Care | Payer: Self-pay | Source: Ambulatory Visit | Attending: Orthopedic Surgery

## 2016-03-05 ENCOUNTER — Encounter (HOSPITAL_COMMUNITY): Payer: Self-pay | Admitting: *Deleted

## 2016-03-05 ENCOUNTER — Ambulatory Visit (HOSPITAL_COMMUNITY): Payer: PPO | Admitting: Certified Registered Nurse Anesthetist

## 2016-03-05 ENCOUNTER — Observation Stay (HOSPITAL_COMMUNITY)
Admission: RE | Admit: 2016-03-05 | Discharge: 2016-03-06 | Disposition: A | Discharge status: Home / Self Care | Payer: PPO | Source: Ambulatory Visit | Attending: Orthopedic Surgery | Admitting: Orthopedic Surgery

## 2016-03-05 DIAGNOSIS — M25612 Stiffness of left shoulder, not elsewhere classified: Secondary | ICD-10-CM | POA: Insufficient documentation

## 2016-03-05 DIAGNOSIS — M75122 Complete rotator cuff tear or rupture of left shoulder, not specified as traumatic: Secondary | ICD-10-CM | POA: Diagnosis not present

## 2016-03-05 DIAGNOSIS — S46212A Strain of muscle, fascia and tendon of other parts of biceps, left arm, initial encounter: Secondary | ICD-10-CM | POA: Diagnosis not present

## 2016-03-05 DIAGNOSIS — I110 Hypertensive heart disease with heart failure: Secondary | ICD-10-CM | POA: Insufficient documentation

## 2016-03-05 DIAGNOSIS — I252 Old myocardial infarction: Secondary | ICD-10-CM | POA: Insufficient documentation

## 2016-03-05 DIAGNOSIS — I493 Ventricular premature depolarization: Secondary | ICD-10-CM | POA: Insufficient documentation

## 2016-03-05 DIAGNOSIS — G8918 Other acute postprocedural pain: Secondary | ICD-10-CM | POA: Diagnosis not present

## 2016-03-05 DIAGNOSIS — I5022 Chronic systolic (congestive) heart failure: Secondary | ICD-10-CM

## 2016-03-05 DIAGNOSIS — Z87442 Personal history of urinary calculi: Secondary | ICD-10-CM | POA: Insufficient documentation

## 2016-03-05 DIAGNOSIS — M1990 Unspecified osteoarthritis, unspecified site: Secondary | ICD-10-CM | POA: Diagnosis not present

## 2016-03-05 DIAGNOSIS — K219 Gastro-esophageal reflux disease without esophagitis: Secondary | ICD-10-CM | POA: Diagnosis not present

## 2016-03-05 DIAGNOSIS — Z95810 Presence of automatic (implantable) cardiac defibrillator: Secondary | ICD-10-CM

## 2016-03-05 DIAGNOSIS — X58XXXA Exposure to other specified factors, initial encounter: Secondary | ICD-10-CM | POA: Insufficient documentation

## 2016-03-05 DIAGNOSIS — R600 Localized edema: Secondary | ICD-10-CM | POA: Diagnosis not present

## 2016-03-05 DIAGNOSIS — S46012A Strain of muscle(s) and tendon(s) of the rotator cuff of left shoulder, initial encounter: Secondary | ICD-10-CM | POA: Diagnosis not present

## 2016-03-05 DIAGNOSIS — G4730 Sleep apnea, unspecified: Secondary | ICD-10-CM | POA: Insufficient documentation

## 2016-03-05 DIAGNOSIS — Z7901 Long term (current) use of anticoagulants: Secondary | ICD-10-CM | POA: Insufficient documentation

## 2016-03-05 DIAGNOSIS — I10 Essential (primary) hypertension: Secondary | ICD-10-CM | POA: Diagnosis not present

## 2016-03-05 DIAGNOSIS — I2510 Atherosclerotic heart disease of native coronary artery without angina pectoris: Secondary | ICD-10-CM | POA: Diagnosis not present

## 2016-03-05 DIAGNOSIS — E785 Hyperlipidemia, unspecified: Secondary | ICD-10-CM | POA: Insufficient documentation

## 2016-03-05 DIAGNOSIS — M109 Gout, unspecified: Secondary | ICD-10-CM | POA: Insufficient documentation

## 2016-03-05 DIAGNOSIS — N400 Enlarged prostate without lower urinary tract symptoms: Secondary | ICD-10-CM | POA: Diagnosis not present

## 2016-03-05 DIAGNOSIS — Z7902 Long term (current) use of antithrombotics/antiplatelets: Secondary | ICD-10-CM | POA: Insufficient documentation

## 2016-03-05 DIAGNOSIS — I255 Ischemic cardiomyopathy: Secondary | ICD-10-CM | POA: Insufficient documentation

## 2016-03-05 DIAGNOSIS — M75112 Incomplete rotator cuff tear or rupture of left shoulder, not specified as traumatic: Principal | ICD-10-CM | POA: Insufficient documentation

## 2016-03-05 DIAGNOSIS — Z955 Presence of coronary angioplasty implant and graft: Secondary | ICD-10-CM | POA: Insufficient documentation

## 2016-03-05 DIAGNOSIS — M25512 Pain in left shoulder: Secondary | ICD-10-CM | POA: Diagnosis not present

## 2016-03-05 DIAGNOSIS — D6862 Lupus anticoagulant syndrome: Secondary | ICD-10-CM | POA: Insufficient documentation

## 2016-03-05 DIAGNOSIS — M75102 Unspecified rotator cuff tear or rupture of left shoulder, not specified as traumatic: Secondary | ICD-10-CM | POA: Diagnosis not present

## 2016-03-05 DIAGNOSIS — Z9889 Other specified postprocedural states: Secondary | ICD-10-CM

## 2016-03-05 HISTORY — PX: SHOULDER ARTHROSCOPY WITH SUBACROMIAL DECOMPRESSION: SHX5684

## 2016-03-05 LAB — CBC
HCT: 41.5 % (ref 39.0–52.0)
Hemoglobin: 14.2 g/dL (ref 13.0–17.0)
MCH: 30.1 pg (ref 26.0–34.0)
MCHC: 34.2 g/dL (ref 30.0–36.0)
MCV: 87.9 fL (ref 78.0–100.0)
PLATELETS: 186 10*3/uL (ref 150–400)
RBC: 4.72 MIL/uL (ref 4.22–5.81)
RDW: 14.9 % (ref 11.5–15.5)
WBC: 7.2 10*3/uL (ref 4.0–10.5)

## 2016-03-05 LAB — BASIC METABOLIC PANEL
Anion gap: 8 (ref 5–15)
BUN: 15 mg/dL (ref 6–20)
CALCIUM: 9.5 mg/dL (ref 8.9–10.3)
CO2: 23 mmol/L (ref 22–32)
CREATININE: 0.98 mg/dL (ref 0.61–1.24)
Chloride: 109 mmol/L (ref 101–111)
GFR calc non Af Amer: >60 mL/min (ref >60)
GLUCOSE: 119 mg/dL — AB (ref 65–99)
Potassium: 3.7 mmol/L (ref 3.5–5.1)
Sodium: 140 mmol/L (ref 135–145)

## 2016-03-05 LAB — PROTIME-INR
INR: 1.07
PROTHROMBIN TIME: 13.9 s (ref 11.4–15.2)

## 2016-03-05 SURGERY — SHOULDER ARTHROSCOPY WITH SUBACROMIAL DECOMPRESSION
Anesthesia: Regional | Site: Shoulder | Laterality: Left

## 2016-03-05 MED ORDER — ALBUTEROL SULFATE HFA 108 (90 BASE) MCG/ACT IN AERS
INHALATION_SPRAY | RESPIRATORY_TRACT | Status: DC | PRN
  Administered 2016-03-05: 6 via RESPIRATORY_TRACT

## 2016-03-05 MED ORDER — ONDANSETRON HCL 4 MG/2ML IJ SOLN
INTRAMUSCULAR | Status: DC | PRN
  Administered 2016-03-05: 4 mg via INTRAVENOUS

## 2016-03-05 MED ORDER — ATORVASTATIN CALCIUM 80 MG PO TABS
80.0000 mg | ORAL_TABLET | Freq: Every day | ORAL | Status: DC
  Administered 2016-03-05: 80 mg via ORAL
  Filled 2016-03-05: qty 1

## 2016-03-05 MED ORDER — CARVEDILOL 6.25 MG PO TABS
6.2500 mg | ORAL_TABLET | Freq: Two times a day (BID) | ORAL | Status: DC
  Administered 2016-03-05 – 2016-03-06 (×2): 6.25 mg via ORAL
  Filled 2016-03-05 (×2): qty 1

## 2016-03-05 MED ORDER — PROPOFOL 10 MG/ML IV BOLUS
INTRAVENOUS | Status: DC | PRN
  Administered 2016-03-05: 100 mg via INTRAVENOUS

## 2016-03-05 MED ORDER — TAMSULOSIN HCL 0.4 MG PO CAPS
0.4000 mg | ORAL_CAPSULE | Freq: Every day | ORAL | Status: DC
  Administered 2016-03-05: 0.4 mg via ORAL
  Filled 2016-03-05: qty 1

## 2016-03-05 MED ORDER — LACTATED RINGERS IV SOLN
INTRAVENOUS | Status: DC | PRN
  Administered 2016-03-05: 11:00:00 via INTRAVENOUS

## 2016-03-05 MED ORDER — LISINOPRIL 2.5 MG PO TABS: 2.5000 mg | ORAL_TABLET | Freq: Every day | ORAL | Status: DC

## 2016-03-05 MED ORDER — ACETAMINOPHEN 500 MG PO TABS
1000.0000 mg | ORAL_TABLET | Freq: Four times a day (QID) | ORAL | Status: DC
  Administered 2016-03-05 (×2): 1000 mg via ORAL
  Filled 2016-03-05 (×2): qty 2

## 2016-03-05 MED ORDER — MENTHOL 3 MG MT LOZG: 1.0000 | LOZENGE | OROMUCOSAL | Status: DC | PRN

## 2016-03-05 MED ORDER — BUPIVACAINE-EPINEPHRINE (PF) 0.5% -1:200000 IJ SOLN
INTRAMUSCULAR | Status: DC | PRN
  Administered 2016-03-05: 30 mL via PERINEURAL

## 2016-03-05 MED ORDER — LIDOCAINE HCL (CARDIAC) 20 MG/ML IV SOLN
INTRAVENOUS | Status: DC | PRN
  Administered 2016-03-05: 60 mg via INTRAVENOUS

## 2016-03-05 MED ORDER — MIDAZOLAM HCL 2 MG/2ML IJ SOLN: 2.0000 mg | Freq: Once | INTRAMUSCULAR | Status: DC

## 2016-03-05 MED ORDER — SPIRONOLACTONE 25 MG PO TABS
25.0000 mg | ORAL_TABLET | Freq: Every day | ORAL | Status: DC
  Administered 2016-03-05: 25 mg via ORAL
  Filled 2016-03-05: qty 1

## 2016-03-05 MED ORDER — ONDANSETRON HCL 4 MG PO TABS: 4.0000 mg | ORAL_TABLET | Freq: Four times a day (QID) | ORAL | Status: DC | PRN

## 2016-03-05 MED ORDER — ONDANSETRON HCL 4 MG/2ML IJ SOLN: 4.0000 mg | Freq: Four times a day (QID) | INTRAMUSCULAR | Status: DC | PRN

## 2016-03-05 MED ORDER — SODIUM CHLORIDE 0.9 % IV SOLN: INTRAVENOUS | Status: DC

## 2016-03-05 MED ORDER — FENTANYL CITRATE (PF) 100 MCG/2ML IJ SOLN
INTRAMUSCULAR | Status: DC | PRN
  Administered 2016-03-05: 50 ug via INTRAVENOUS

## 2016-03-05 MED ORDER — OXYCODONE HCL 5 MG PO TABS
5.0000 mg | ORAL_TABLET | ORAL | Status: DC | PRN
  Administered 2016-03-05 – 2016-03-06 (×4): 10 mg via ORAL
  Filled 2016-03-05 (×4): qty 2

## 2016-03-05 MED ORDER — ACETAMINOPHEN 650 MG RE SUPP: 650.0000 mg | Freq: Four times a day (QID) | RECTAL | Status: DC | PRN

## 2016-03-05 MED ORDER — FUROSEMIDE 40 MG PO TABS
40.0000 mg | ORAL_TABLET | Freq: Every day | ORAL | Status: DC
  Administered 2016-03-05: 40 mg via ORAL
  Filled 2016-03-05: qty 1

## 2016-03-05 MED ORDER — MIDAZOLAM HCL 2 MG/2ML IJ SOLN
INTRAMUSCULAR | Status: AC
  Filled 2016-03-05: qty 2

## 2016-03-05 MED ORDER — MORPHINE SULFATE (PF) 2 MG/ML IV SOLN: 1.0000 mg | INTRAVENOUS | Status: DC | PRN

## 2016-03-05 MED ORDER — CEFAZOLIN SODIUM-DEXTROSE 2-4 GM/100ML-% IV SOLN
2.0000 g | INTRAVENOUS | Status: AC
  Administered 2016-03-05: 2 g via INTRAVENOUS
  Filled 2016-03-05: qty 100

## 2016-03-05 MED ORDER — PROPOFOL 10 MG/ML IV BOLUS
INTRAVENOUS | Status: AC
Start: 2016-03-05 — End: 2016-03-05
  Filled 2016-03-05: qty 20

## 2016-03-05 MED ORDER — ROCURONIUM BROMIDE 100 MG/10ML IV SOLN
INTRAVENOUS | Status: DC | PRN
  Administered 2016-03-05: 50 mg via INTRAVENOUS

## 2016-03-05 MED ORDER — PANTOPRAZOLE SODIUM 40 MG PO TBEC: 40.0000 mg | DELAYED_RELEASE_TABLET | Freq: Every day | ORAL | Status: DC

## 2016-03-05 MED ORDER — FENTANYL CITRATE (PF) 100 MCG/2ML IJ SOLN
INTRAMUSCULAR | Status: AC
  Administered 2016-03-05: 50 ug
  Filled 2016-03-05: qty 2

## 2016-03-05 MED ORDER — PHENYLEPHRINE HCL 10 MG/ML IJ SOLN
INTRAVENOUS | Status: DC | PRN
  Administered 2016-03-05: 40 ug/min via INTRAVENOUS

## 2016-03-05 MED ORDER — SODIUM CHLORIDE 0.9 % IR SOLN
Status: DC | PRN
  Administered 2016-03-05 (×5): 3000 mL

## 2016-03-05 MED ORDER — OXYCODONE-ACETAMINOPHEN 5-325 MG PO TABS: 1.0000 | ORAL_TABLET | ORAL | 0 refills | Status: DC | PRN

## 2016-03-05 MED ORDER — ALUMINUM HYDROXIDE GEL 320 MG/5ML PO SUSP
15.0000 mL | ORAL | Status: DC | PRN
  Filled 2016-03-05: qty 30

## 2016-03-05 MED ORDER — FENTANYL CITRATE (PF) 100 MCG/2ML IJ SOLN
INTRAMUSCULAR | Status: AC
Start: 2016-03-05 — End: 2016-03-05
  Filled 2016-03-05: qty 4

## 2016-03-05 MED ORDER — METOCLOPRAMIDE HCL 5 MG PO TABS: 5.0000 mg | ORAL_TABLET | Freq: Three times a day (TID) | ORAL | Status: DC | PRN

## 2016-03-05 MED ORDER — ACETAMINOPHEN 325 MG PO TABS: 650.0000 mg | ORAL_TABLET | Freq: Four times a day (QID) | ORAL | Status: DC | PRN

## 2016-03-05 MED ORDER — FENTANYL CITRATE (PF) 100 MCG/2ML IJ SOLN: 100.0000 ug | Freq: Once | INTRAMUSCULAR | Status: DC

## 2016-03-05 MED ORDER — DOCUSATE SODIUM 100 MG PO CAPS: 100.0000 mg | ORAL_CAPSULE | Freq: Three times a day (TID) | ORAL | 0 refills | Status: DC | PRN

## 2016-03-05 MED ORDER — METOCLOPRAMIDE HCL 5 MG/ML IJ SOLN: 5.0000 mg | Freq: Three times a day (TID) | INTRAMUSCULAR | Status: DC | PRN

## 2016-03-05 MED ORDER — HYDROMORPHONE HCL 1 MG/ML IJ SOLN: 0.2500 mg | INTRAMUSCULAR | Status: DC | PRN

## 2016-03-05 MED ORDER — ALLOPURINOL 100 MG PO TABS: 100.0000 mg | ORAL_TABLET | Freq: Every day | ORAL | Status: DC

## 2016-03-05 MED ORDER — ONDANSETRON HCL 4 MG/2ML IJ SOLN
INTRAMUSCULAR | Status: AC
  Filled 2016-03-05: qty 2

## 2016-03-05 MED ORDER — EPHEDRINE SULFATE-NACL 50-0.9 MG/10ML-% IV SOSY
PREFILLED_SYRINGE | INTRAVENOUS | Status: DC | PRN
  Administered 2016-03-05: 10 mg via INTRAVENOUS

## 2016-03-05 MED ORDER — MIDAZOLAM HCL 2 MG/2ML IJ SOLN
INTRAMUSCULAR | Status: AC
  Administered 2016-03-05: 1 mg
  Filled 2016-03-05: qty 2

## 2016-03-05 MED ORDER — PHENOL 1.4 % MT LIQD: 1.0000 | OROMUCOSAL | Status: DC | PRN

## 2016-03-05 MED ORDER — PHENYLEPHRINE HCL 10 MG/ML IJ SOLN
INTRAMUSCULAR | Status: DC | PRN
  Administered 2016-03-05: 80 ug via INTRAVENOUS
  Administered 2016-03-05: 40 ug via INTRAVENOUS
  Administered 2016-03-05 (×2): 80 ug via INTRAVENOUS

## 2016-03-05 MED ORDER — DIPHENHYDRAMINE HCL 12.5 MG/5ML PO ELIX: 12.5000 mg | ORAL_SOLUTION | ORAL | Status: DC | PRN

## 2016-03-05 MED ORDER — SUGAMMADEX SODIUM 200 MG/2ML IV SOLN
INTRAVENOUS | Status: DC | PRN
  Administered 2016-03-05: 300 mg via INTRAVENOUS

## 2016-03-05 MED ORDER — BUPIVACAINE-EPINEPHRINE (PF) 0.25% -1:200000 IJ SOLN
INTRAMUSCULAR | Status: AC
  Filled 2016-03-05: qty 30

## 2016-03-05 SURGICAL SUPPLY — 58 items
ANCHOR PEEK 4.75X19.1 SWLK C (Anchor) ×9 IMPLANT
BLADE SURG 11 STRL SS (BLADE) ×3 IMPLANT
BUR OVAL 4.0 (BURR) ×3 IMPLANT
CANNULA 5.75X71 LONG (CANNULA) ×3 IMPLANT
CANNULA TWIST IN 8.25X7CM (CANNULA) ×3 IMPLANT
CHLORAPREP W/TINT 26ML (MISCELLANEOUS) ×3 IMPLANT
DRAPE INCISE IOBAN 66X45 STRL (DRAPES) ×3 IMPLANT
DRAPE ORTHO SPLIT 77X108 STRL (DRAPES) ×4
DRAPE STERI 35X30 U-POUCH (DRAPES) ×3 IMPLANT
DRAPE SURG 17X23 STRL (DRAPES) ×3 IMPLANT
DRAPE SURG ORHT 6 SPLT 77X108 (DRAPES) ×2 IMPLANT
DRAPE U-SHAPE 47X51 STRL (DRAPES) ×3 IMPLANT
DRAPE U-SHAPE 76X120 STRL (DRAPES) ×3 IMPLANT
DRSG PAD ABDOMINAL 8X10 ST (GAUZE/BANDAGES/DRESSINGS) ×9 IMPLANT
GAUZE SPONGE 4X4 12PLY STRL (GAUZE/BANDAGES/DRESSINGS) ×3 IMPLANT
GAUZE XEROFORM 1X8 LF (GAUZE/BANDAGES/DRESSINGS) ×3 IMPLANT
GLOVE BIO SURGEON STRL SZ7 (GLOVE) ×6 IMPLANT
GLOVE BIO SURGEON STRL SZ7.5 (GLOVE) ×3 IMPLANT
GLOVE BIOGEL PI IND STRL 7.0 (GLOVE) ×2 IMPLANT
GLOVE BIOGEL PI IND STRL 8 (GLOVE) ×1 IMPLANT
GLOVE BIOGEL PI INDICATOR 7.0 (GLOVE) ×4
GLOVE BIOGEL PI INDICATOR 8 (GLOVE) ×2
GOWN STRL REUS W/ TWL LRG LVL3 (GOWN DISPOSABLE) ×3 IMPLANT
GOWN STRL REUS W/ TWL XL LVL3 (GOWN DISPOSABLE) ×1 IMPLANT
GOWN STRL REUS W/TWL LRG LVL3 (GOWN DISPOSABLE) ×6
GOWN STRL REUS W/TWL XL LVL3 (GOWN DISPOSABLE) ×2
KIT BASIN OR (CUSTOM PROCEDURE TRAY) ×3 IMPLANT
KIT ROOM TURNOVER OR (KITS) ×3 IMPLANT
LASSO CRESCENT QUICKPASS (SUTURE) ×3 IMPLANT
MANIFOLD NEPTUNE II (INSTRUMENTS) ×3 IMPLANT
NDL SUT 6 .5 CRC .975X.05 MAYO (NEEDLE) IMPLANT
NEEDLE HYPO 25GX1X1/2 BEV (NEEDLE) IMPLANT
NEEDLE MAYO TAPER (NEEDLE)
NEEDLE SCORPION MULTI FIRE (NEEDLE) ×3 IMPLANT
NEEDLE SPNL 18GX3.5 QUINCKE PK (NEEDLE) ×3 IMPLANT
PACK SHOULDER (CUSTOM PROCEDURE TRAY) ×3 IMPLANT
PAD ARMBOARD 7.5X6 YLW CONV (MISCELLANEOUS) ×6 IMPLANT
PROBE BIPOLAR ATHRO 135MM 90D (MISCELLANEOUS) ×3 IMPLANT
RESECTOR FULL RADIUS 4.2MM (BLADE) ×3 IMPLANT
SLING ARM FOAM STRAP LRG (SOFTGOODS) ×3 IMPLANT
SLING ARM MED ADULT FOAM STRAP (SOFTGOODS) IMPLANT
SPONGE GAUZE 4X4 12PLY STER LF (GAUZE/BANDAGES/DRESSINGS) ×6 IMPLANT
SPONGE LAP 4X18 X RAY DECT (DISPOSABLE) IMPLANT
SUPPORT WRAP ARM LG (MISCELLANEOUS) ×3 IMPLANT
SUT ETHILON 3 0 PS 1 (SUTURE) ×3 IMPLANT
SUT FIBERWIRE #2 38 T-5 BLUE (SUTURE) ×3
SUT TIGER TAPE 7 IN WHITE (SUTURE) ×3 IMPLANT
SUTURE FIBERWR #2 38 T-5 BLUE (SUTURE) ×1 IMPLANT
SYR CONTROL 10ML LL (SYRINGE) ×3 IMPLANT
TAPE FIBER 2MM 7IN #2 BLUE (SUTURE) ×6 IMPLANT
TOWEL OR 17X24 6PK STRL BLUE (TOWEL DISPOSABLE) ×3 IMPLANT
TOWEL OR 17X26 10 PK STRL BLUE (TOWEL DISPOSABLE) ×3 IMPLANT
TOWEL OR NON WOVEN STRL DISP B (DISPOSABLE) ×3 IMPLANT
TUBE CONNECTING 12'X1/4 (SUCTIONS) ×1
TUBE CONNECTING 12X1/4 (SUCTIONS) ×2 IMPLANT
TUBING ARTHROSCOPY IRRIG 16FT (MISCELLANEOUS) ×3 IMPLANT
WAND HAND CNTRL MULTIVAC 90 (MISCELLANEOUS) ×3 IMPLANT
WATER STERILE IRR 1000ML POUR (IV SOLUTION) ×3 IMPLANT

## 2016-03-05 NOTE — Progress Notes (Signed)
EPIC Encounter for ICM Monitoring  Patient Name: Kevin Hart is a 70 y.o. male Date: 03/05/2016 Primary Care Physican: Guadlupe Spanish, MD Primary [place] Electrophysiologist: Allred Dry Weight:unknown          Attempted ICM call and unable to reach.  Left detailed message regarding transmission.  Transmission reviewed.   Thoracic impedance above baseline suggesting dryness.  Patient had should arthroscopy today.  Impedance was abnormal 11/22 - 11/30 and 12/5 - 12/10  Recommendations: None - left direct ICM number and encouraged to call if he has any fluid symptoms.     Follow-up plan: ICM clinic phone appointment on 03/26/2016.  Copy of ICM check sent to device physician.   ICM trend: 03/05/2016       Rosalene Billings, RN 03/05/2016 4:34 PM

## 2016-03-05 NOTE — Anesthesia Postprocedure Evaluation (Signed)
Anesthesia Post Note  Patient: KENDER HANBY  Procedure(s) Performed: Procedure(s) (LRB): SHOULDER ARTHROSCOPY ROTATOR CUFF REPAIR WITH SUBACROMIAL DECOMPRESSION (Left)  Patient location during evaluation: PACU Anesthesia Type: General and Regional Level of consciousness: awake and alert Pain management: pain level controlled Vital Signs Assessment: post-procedure vital signs reviewed and stable Respiratory status: spontaneous breathing, nonlabored ventilation and respiratory function stable Cardiovascular status: blood pressure returned to baseline and stable Postop Assessment: no signs of nausea or vomiting Anesthetic complications: no    Last Vitals:  Vitals:   03/05/16 1400 03/05/16 1421  BP:  118/78  Pulse: 88 87  Resp: 11 16  Temp:      Last Pain:  Vitals:   03/05/16 1421  TempSrc:   PainSc: 0-No pain                 Shanan Fitzpatrick,W. EDMOND

## 2016-03-05 NOTE — Anesthesia Procedure Notes (Signed)
Procedure Name: Intubation Date/Time: 03/05/2016 11:06 AM Performed by: Ollen Bowl Pre-anesthesia Checklist: Patient identified, Emergency Drugs available, Suction available, Patient being monitored and Timeout performed Patient Re-evaluated:Patient Re-evaluated prior to inductionOxygen Delivery Method: Circle system utilized and Simple face mask Preoxygenation: Pre-oxygenation with 100% oxygen Intubation Type: IV induction Ventilation: Mask ventilation without difficulty Laryngoscope Size: Miller and 3 Grade View: Grade I Tube type: Oral Tube size: 7.5 mm Number of attempts: 1 Airway Equipment and Method: Patient positioned with wedge pillow and Stylet Placement Confirmation: ETT inserted through vocal cords under direct vision,  positive ETCO2 and breath sounds checked- equal and bilateral Secured at: 22 cm Tube secured with: Tape Dental Injury: Teeth and Oropharynx as per pre-operative assessment

## 2016-03-05 NOTE — Discharge Instructions (Signed)

## 2016-03-05 NOTE — Progress Notes (Signed)
Patient refuses CPAP for the night. RT will continue to monitor.  

## 2016-03-05 NOTE — Progress Notes (Signed)
AICD turned back on by Miranda.

## 2016-03-05 NOTE — Transfer of Care (Signed)
Immediate Anesthesia Transfer of Care Note  Patient: KONSTANTINOS GIANG  Procedure(s) Performed: Procedure(s) with comments: SHOULDER ARTHROSCOPY ROTATOR CUFF REPAIR WITH SUBACROMIAL DECOMPRESSION (Left) - SHOULDER ARTHROSCOPY ROTATOR CUFF REPAIR WITH SUBACROMIAL DECOMPRESSION  Patient Location: PACU  Anesthesia Type:GA combined with regional for post-op pain  Level of Consciousness: awake and alert   Airway & Oxygen Therapy: Patient Spontanous Breathing and Patient connected to nasal cannula oxygen  Post-op Assessment: Report given to RN and Post -op Vital signs reviewed and stable  Post vital signs: Reviewed and stable  Last Vitals:  Vitals:   03/05/16 0818 03/05/16 1342  BP: 125/71   Pulse: 86   Resp: 18   Temp: 36.8 C 36.3 C    Last Pain:  Vitals:   03/05/16 1342  TempSrc:   PainSc: 0-No pain         Complications: No apparent anesthesia complications

## 2016-03-05 NOTE — Telephone Encounter (Signed)
Remote ICM transmission received.  Attempted patient call and left detailed message regarding transmission and next ICM scheduled for 03/26/2016.  Advised to return call for any fluid symptoms or questions.

## 2016-03-05 NOTE — Op Note (Signed)
Procedure(s): SHOULDER ARTHROSCOPY ROTATOR CUFF REPAIR WITH SUBACROMIAL DECOMPRESSION Procedure Note  Kevin Hart male 70 y.o. 03/05/2016  Procedure(s) and Anesthesia Type:   #1 LEFT SHOULDER ARTHROSCOPIC ROTATOR CUFF REPAIR   #2 left shoulder arthroscopic biceps tenotomy and debridement #3 left shoulder arthroscopic subacromial decompression   Surgeon(s) and Role:    * Tania Ade, MD - Primary     Surgeon: Nita Sells   Assistants:Danielle Judye Bos PA-C (Danielle was present and scrubbed throughout the procedure and was essential in positioning, assisting with the camera and instrumentation,, and closure)  Anesthesia: General endotracheal anesthesia with preoperative interscalene block given by the attending anesthesiologist   Procedure Detail  SHOULDER ARTHROSCOPY ROTATOR CUFF REPAIR WITH SUBACROMIAL DECOMPRESSION  Estimated Blood Loss: Min         Drains: none  Blood Given: none         Specimens: none        Complications:  * No complications entered in OR log *         Disposition: PACU - hemodynamically stable.         Condition: stable    Procedure:   INDICATIONS FOR SURGERY: The patient is 70 y.o. male who suffered a left shoulder fracture dislocation 3 months ago. He required reduction in the operating room. Postreduction he was gradually mobilized and it was felt he likely had an associated rotator cuff tear, but was unable to get an MRI secondary to AICD. Ultimately after failing to improve clinically he had an ultrasound which was concerning for rotator cuff tear and was indicated for diagnostic arthroscopy and likely rotator cuff repair to try and decrease pain and restore function. She understood risks benefits and alternatives to the procedure and wished to proceed.  OPERATIVE FINDINGS: Examination under anesthesia: He was noted to have fairly significant stiffness particularly in forward flexion which is limited to 90 passively.  With gentle manipulation and satisfactory lysis of adhesions I was able to achieve full forward flexion and full external rotation out to about 60.  DESCRIPTION OF PROCEDURE: The patient was identified in preoperative  holding area where I personally marked the operative site after  verifying site, side, and procedure with the patient. An interscalene block was given by the attending anesthesiologist the holding area.  The patient was taken back to the operating room where general anesthesia was induced without complication and was placed in the beach-chair position with the back  elevated about 60 degrees and all extremities and head and neck carefully padded and  positioned.   The left upper extremity was then prepped and  draped in a standard sterile fashion. The appropriate time-out  procedure was carried out. The patient did receive IV antibiotics  within 30 minutes of incision.   A small posterior portal incision was made and the arthroscope was introduced into the joint. An anterior portal was then established above the subscapularis using needle localization. Small cannula was placed anteriorly. Diagnostic arthroscopy was then carried out.  The biceps tendon was noted to be dislocated medially into the torn upper border of the subscapularis. The subscapularis was torn but only minimally retracted. Joint surfaces were intact grossly. The biceps tendon was released from the superior labrum using a large biter to allow to retract out of the shoulder for repair the subscapularis. Attention was turned to the subscapularis. A large cannula was placed anteriorly with a small cannula placed in a high lateral rotator interval position. A traction stitch was placed in the upper  border of the tendinous portion of the subscapularis and then viewing from a lateral portal down the plane of the subscapularis was able to do a 3 sided release medially superior and anterior taking care to avoid any  neurovascular structures. After complete release was able to be brought back to the tuberosity, but was still not fully mobilized. The tendon was then repaired back to the bony tuberosity using one inverted mattress fiber tape suture into a 4.75 swivel lock anchor. The tuberosity was prepared and bleeding bone with a bur to promote healing. The repair was felt to be anatomic but slightly tight. The arthroscope was in place in the subacromial space.  There was a fair amount of scar tissue and hemorrhagic tissue in the subacromial space. This was carefully resected. Once the tuberosity could be adequately visualized was determined there was indeed complete tears of the supraspinatus and infraspinatus. Initially it appeared that this was not very retracted, however after debriding back scar tissue and loose tissue which was not actually tendon, the tendon was noted to be very retracted, particularly infraspinatus which was retracted to the level of the glenoid. Anteriorly the supraspinatus could be brought back over the tuberosity without significant tension. Therefore using 2 fiber tapes in an inverted mattress configuration into 2 additional 4.75 swivel lock anchors I was able to bring this back over into a near-anatomic position without undue tension. Unfortunately the infraspinatus could not be mobilized adequately to allow repair and the posterior aspect of the rotator cuff was left unrepaired.  The coracoacromial ligament was peeled anteriorly using a moderate hooked anterior acromial spur. The bur was used from the posterior portal while viewing laterally to resect this in a cutting block technique creating a nice flat type I acromion.   The arthroscopic equipment was removed from the joint and the portals were closed with 3-0 nylon in an interrupted fashion. Sterile dressings were then applied including Xeroform 4 x 4's ABDs and tape. The patient was then allowed to awaken from general anesthesia, placed  in a sling, transferred to the stretcher and taken to the recovery room in stable condition.   POSTOPERATIVE PLAN: The patient will be discharged home today and will followup in one week for suture removal and wound check.  He will follow the massive cuff protocol.

## 2016-03-05 NOTE — Anesthesia Procedure Notes (Addendum)
Anesthesia Regional Block:  Interscalene brachial plexus block  Pre-Anesthetic Checklist: ,, timeout performed, Correct Patient, Correct Site, Correct Laterality, Correct Procedure, Correct Position, site marked, Risks and benefits discussed, pre-op evaluation,  At surgeon's request and post-op pain management  Laterality: Left  Prep: Maximum Sterile Barrier Precautions used, chloraprep       Needles:  Injection technique: Single-shot  Needle Type: Echogenic Stimulator Needle     Needle Length: 5cm 5 cm Needle Gauge: 22 and 22 G    Additional Needles:  Procedures: ultrasound guided (picture in chart) and nerve stimulator Interscalene brachial plexus block  Nerve Stimulator or Paresthesia:  Response: Biceps response,   Additional Responses:   Narrative:  Start time: 03/05/2016 9:15 AM End time: 03/05/2016 9:25 AM Injection made incrementally with aspirations every 5 mL. Anesthesiologist: Roderic Palau  Additional Notes: 2% Lidocaine skin wheel.

## 2016-03-05 NOTE — H&P (Signed)
Kevin Hart is an 70 y.o. male.   Chief Complaint: L shoulder pain and dysfunction HPI: s/p L shoulder fracture dislocation with continued dysfunction concerning for rotator cuff tear.  Indicated for surgery to try and restore function and decrease pain.  Past Medical History:  Diagnosis Date  . AICD (automatic cardioverter/defibrillator) present   . Anterior myocardial infarction (HCC) 2000/2016  . Arthritis   . At risk for sudden cardiac death, EF 25-30% with PVCs Jul 26, 2014  . BPH (benign prostatic hyperplasia)    takes Flomax daily  . Bruise    left upper arm and left AC  . CAD (coronary artery disease)    a. prior LAD stenting;  b. 06/2014 NSTEMI/PCI: LM nl, LAD 30p, 52m ISR (3.0x20 Promus DES), LCX nl, RCA nondom, nl, EF 25-30% ant, apical, dist inf AK.  . Cataracts, bilateral    immature  . Chronic systolic CHF (congestive heart failure) (HCC) 12/31/2014   takes Furosemide daily  . DVT (deep venous thrombosis) (HCC)    RECURRENT left leg  . Dyslipidemia    takes Atorvastatin daily  . GERD (gastroesophageal reflux disease)    takes Pantoprazole daily  . Gout    takes Allopurinol daily-pt states his gout is in his heel  . History of kidney stones   . Hypertension    takes Lisinopril and Coreg daily  . Intracranial bleed (HCC)    several yrs ago  . Ischemic cardiomyopathy 07/06/14   EF 25-30%  . Lupus anticoagulant positive    on coumadin  . PVC's (premature ventricular contractions)   . S/P drug eluting coronary stent placement, 07/06/14, Promus 26-Jul-2014  . Sleep apnea   . Subarachnoid bleed (HCC) 2015   after falling and hitting his head    Past Surgical History:  Procedure Laterality Date  . COLONOSCOPY    . CORONARY ANGIOPLASTY     3 stents  . EP IMPLANTABLE DEVICE [name] 11/27/2014   [employer] Fortify Assura VR  implanted by Dr Johney Frame for primary prevention  . KIDNEY STONE    . LEFT HEART CATHETERIZATION WITH CORONARY ANGIOGRAM [name] 07/06/2014   Procedure: LEFT HEART CATHETERIZATION WITH CORONARY ANGIOGRAM;  Surgeon: Peter M Swaziland, MD;  Location: [facility] CATH LAB;  Service: Cardiovascular;  Laterality: [name];  . PERCUTANEOUS CORONARY STENT INTERVENTION (PCI-S) Right 07/06/2014   Procedure: PERCUTANEOUS CORONARY STENT INTERVENTION (PCI-S);  Surgeon: Peter M Swaziland, MD;  Location: [facility] CATH LAB;  Service: Cardiovascular;  Laterality: Right;  . SHOULDER CLOSED REDUCTION Left 11/29/2015   Procedure: CLOSED REDUCTION SHOULDER;  Surgeon: Jones Broom, MD;  Location: [facility] OR;  Service: Orthopedics;  Laterality: Left;  . WRIST SURGERY Bilateral    plates and screws     Family History  Problem Relation Age of Onset  . Aneurysm Father    Social History:  reports that he has never smoked. He has never used smokeless tobacco. He reports that he does not drink alcohol or use drugs.  Allergies:  Allergies  Allergen Reactions  . No Known Allergies     Medications Prior to Admission  Medication Sig Dispense Refill  . allopurinol (ZYLOPRIM) 100 MG tablet Take 100 mg by mouth daily with breakfast.     . Ascorbic Acid (VITAMIN C) 1000 MG tablet Take 1,000 mg by mouth daily.    Marland Kitchen atorvastatin (LIPITOR) 80 MG tablet TAKE ONE (1) TABLET BY MOUTH EVERY DAY AT 6PM (KEEP OFFICE VISIT) 30 tablet 6  . carvedilol (COREG) 6.25 MG tablet Take  1 tablet (6.25 mg total) by mouth 2 (two) times daily. 180 tablet 1  . clopidogrel (PLAVIX) 75 MG tablet Take 1 tablet (75 mg total) by mouth daily. 30 tablet 10  . ELIQUIS 5 MG TABS tablet Take 5 mg by mouth 2 (two) times daily.     Marland Kitchen eplerenone (INSPRA) 25 MG tablet Take 1 tablet (25 mg total) by mouth daily. 30 tablet 6  . furosemide (LASIX) 40 MG tablet Take 40 mg by mouth daily.      Marland Kitchen lisinopril (PRINIVIL,ZESTRIL) 2.5 MG tablet TAKE ONE (1) TABLET BY MOUTH EVERY DAY (Patient taking differently: TAKE ONE (1) TABLET BY MOUTH EVERY NIGHT AT BEDTIME) 30 tablet 5  . Multiple Vitamin (MULTI-VITAMINS) TABS Take 1 tablet by  mouth daily.     . nitroGLYCERIN (NITROSTAT) 0.4 MG SL tablet Place 1 tablet (0.4 mg total) under the tongue every 5 (five) minutes x 3 doses as needed for chest pain. 25 tablet 11  . pantoprazole (PROTONIX) 40 MG tablet Take 1 tablet (40 mg total) by mouth daily. 30 tablet 2  . Tamsulosin HCl (FLOMAX) 0.4 MG CAPS Take 0.4 mg by mouth daily.     . Vitamin D, Ergocalciferol, (DRISDOL) 50000 UNITS CAPS capsule Take 50,000 Units by mouth once a week. Saturdays    . HYDROcodone-acetaminophen (NORCO) 5-325 MG tablet Take 1-2 tablets by mouth every 4 (four) hours as needed for moderate pain. 30 tablet 0    Results for orders placed or performed during the hospital encounter of 03/05/16 (from the past 48 hour(s))  Basic metabolic panel     Status: Abnormal   Collection Time: 03/05/16  8:51 AM  Result Value Ref Range   Sodium 140 135 - 145 mmol/L   Potassium 3.7 3.5 - 5.1 mmol/L   Chloride 109 101 - 111 mmol/L   CO2 23 22 - 32 mmol/L   Glucose, Bld 119 (H) 65 - 99 mg/dL   BUN 15 6 - 20 mg/dL   Creatinine, Ser 0.98 0.61 - 1.24 mg/dL   Calcium 9.5 8.9 - 10.3 mg/dL   GFR calc non Af Amer >60 >60 mL/min   GFR calc Af Amer >60 >60 mL/min    Comment: (NOTE) The eGFR has been calculated using the CKD EPI equation. This calculation has not been validated in all clinical situations. eGFR's persistently <60 mL/min signify possible Chronic Kidney Disease.    Anion gap 8 5 - 15  CBC     Status: None   Collection Time: 03/05/16  8:51 AM  Result Value Ref Range   WBC 7.2 4.0 - 10.5 K/uL   RBC 4.72 4.22 - 5.81 MIL/uL   Hemoglobin 14.2 13.0 - 17.0 g/dL   HCT 41.5 39.0 - 52.0 %   MCV 87.9 78.0 - 100.0 fL   MCH 30.1 26.0 - 34.0 pg   MCHC 34.2 30.0 - 36.0 g/dL   RDW 14.9 11.5 - 15.5 %   Platelets 186 150 - 400 K/uL  Protime-INR     Status: None   Collection Time: 03/05/16  8:51 AM  Result Value Ref Range   Prothrombin Time 13.9 11.4 - 15.2 seconds   INR 1.07    No results found.  Review of  Systems  All other systems reviewed and are negative.   Blood pressure 125/71, pulse 86, temperature 98.2 F (36.8 C), temperature source Oral, resp. rate 18, height 6' (1.829 m), weight 111.1 kg (244 lb 14.9 oz), SpO2 97 %. Physical Exam  Constitutional: He is oriented to person, place, and time. He appears well-developed and well-nourished.  HENT:  Head: Atraumatic.  Eyes: EOM are normal.  Cardiovascular: Intact distal pulses.   Respiratory: Effort normal.  Musculoskeletal:  L shoulder pain with limited active motion.  Neurological: He is alert and oriented to person, place, and time.  Skin: Skin is warm and dry.  Psychiatric: He has a normal mood and affect.     Assessment/Plan s/p L shoulder fracture dislocation with continued dysfunction concerning for rotator cuff tear.  Indicated for surgery to try and restore function and decrease pain. Plan L diagnostic arthroscopy, likely rotator cuff repair, SAD Risks / benefits of surgery discussed Consent on chart  NPO for OR Preop antibiotics   Nita Sells, MD 03/05/2016, 10:16 AM

## 2016-03-05 NOTE — Anesthesia Preprocedure Evaluation (Addendum)
Anesthesia Evaluation  Patient identified by MRN, date of birth, ID band Patient awake    Reviewed: Allergy & Precautions, H&P , NPO status , Patient's Chart, lab work & pertinent test results, reviewed documented beta blocker date and time   Airway Mallampati: II  TM Distance: >3 FB Neck ROM: Full    Dental no notable dental hx. (+) Partial Upper, Partial Lower, Dental Advisory Given   Pulmonary sleep apnea ,    Pulmonary exam normal breath sounds clear to auscultation       Cardiovascular hypertension, Pt. on medications and Pt. on home beta blockers + CAD, + Past MI, + Cardiac Stents and +CHF  + Cardiac Defibrillator  Rhythm:Regular Rate:Normal     Neuro/Psych negative neurological ROS  negative psych ROS   GI/Hepatic Neg liver ROS, GERD  Medicated and Controlled,  Endo/Other  negative endocrine ROS  Renal/GU negative Renal ROS  negative genitourinary   Musculoskeletal  (+) Arthritis , Osteoarthritis,    Abdominal   Peds  Hematology negative hematology ROS (+)   Anesthesia Other Findings   Reproductive/Obstetrics negative OB ROS                            Anesthesia Physical Anesthesia Plan  ASA: III  Anesthesia Plan: General and Regional   Post-op Pain Management: GA combined w/ Regional for post-op pain   Induction: Intravenous  Airway Management Planned: Oral ETT  Additional Equipment:   Intra-op Plan:   Post-operative Plan: Extubation in OR  Informed Consent: I have reviewed the patients History and Physical, chart, labs and discussed the procedure including the risks, benefits and alternatives for the proposed anesthesia with the patient or authorized representative who has indicated his/her understanding and acceptance.   Dental advisory given  Plan Discussed with: CRNA  Anesthesia Plan Comments:         Anesthesia Quick Evaluation

## 2016-03-06 ENCOUNTER — Encounter (HOSPITAL_COMMUNITY): Payer: Self-pay | Admitting: Orthopedic Surgery

## 2016-03-06 DIAGNOSIS — M75112 Incomplete rotator cuff tear or rupture of left shoulder, not specified as traumatic: Secondary | ICD-10-CM | POA: Diagnosis not present

## 2016-03-06 NOTE — Discharge Summary (Signed)
Patient ID: Kevin Hart MRN: [id number] DOB/AGE: March 22, 1946 70 y.o.  Admit date: 03/05/2016 Discharge date: 03/06/2016  Admission Diagnoses:  Active Problems:   S/P left rotator cuff repair   Discharge Diagnoses:  Same  Past Medical History:  Diagnosis Date  . AICD (automatic cardioverter/defibrillator) present   . Anterior myocardial infarction ([place]) 2000/2016  . Arthritis   . At risk for sudden cardiac death, EF 25-30% with PVCs 07/17/14  . BPH (benign prostatic hyperplasia)    takes Flomax daily  . Bruise    left upper arm and left AC  . CAD (coronary artery disease)    a. prior LAD stenting;  b. 06/2014 NSTEMI/PCI: LM nl, LAD 30p, 59m ISR (3.0x20 Promus DES), LCX nl, RCA nondom, nl, EF 25-30% ant, apical, dist inf AK.  . Cataracts, bilateral    immature  . Chronic systolic CHF (congestive heart failure) ([place]) 12/31/2014   takes Furosemide daily  . DVT (deep venous thrombosis) ([facility])    RECURRENT left leg  . Dyslipidemia    takes Atorvastatin daily  . GERD (gastroesophageal reflux disease)    takes Pantoprazole daily  . Gout    takes Allopurinol daily-pt states his gout is in his heel  . History of kidney stones   . Hypertension    takes Lisinopril and Coreg daily  . Intracranial bleed ([place])    several yrs ago  . Ischemic cardiomyopathy 07/06/14   EF 25-30%  . Lupus anticoagulant positive    on coumadin  . PVC's (premature ventricular contractions)   . S/P drug eluting coronary stent placement, 07/06/14, Promus 07/17/14  . Sleep apnea   . Subarachnoid bleed ([place]) 2015   after falling and hitting his head    Surgeries: Procedure(s): SHOULDER ARTHROSCOPY ROTATOR CUFF REPAIR WITH SUBACROMIAL DECOMPRESSION on 03/05/2016   Consultants:   Discharged Condition: Improved  Hospital Course: Kevin Hart is an 70 y.o. male who was admitted 03/05/2016 for operative treatment of left shoulder rotator cuff tear. Patient has severe unremitting pain that affects  sleep, daily activities, and work/hobbies. After pre-op clearance the patient was taken to the operating room on 03/05/2016 and underwent  Procedure(s): SHOULDER ARTHROSCOPY ROTATOR CUFF REPAIR WITH SUBACROMIAL DECOMPRESSION.    Patient was given perioperative antibiotics: Anti-infectives    Start     Dose/Rate Route Frequency Ordered Stop   03/05/16 0900  ceFAZolin (ANCEF) IVPB 2g/100 mL premix     2 g 200 mL/hr over 30 Minutes Intravenous On call to O.R. 03/05/16 [id number] 03/05/16 1111       Patient was given sequential compression devices, early ambulation, and asa to prevent DVT.  He was kept overnight for observation for untreated OSA. Patient benefited maximally from hospital stay and there were no complications.    Recent vital signs: Patient Vitals for the past 24 hrs:  BP Temp Temp src Pulse Resp SpO2  03/06/16 0400 114/73 98.1 F (36.7 C) Oral 83 16 96 %  03/06/16 0013 (!) 102/56 97.6 F (36.4 C) Oral 87 16 99 %  03/05/16 1951 (!) 103/58 97.6 F (36.4 C) Oral 71 16 98 %  03/05/16 1538 119/67 97.7 F (36.5 C) Oral 82 (!) 156 97 %  03/05/16 1421 118/78 - - 87 16 100 %  03/05/16 1412 - 97.7 F (36.5 C) - - - -  03/05/16 1400 - - - 88 11 95 %  03/05/16 1356 111/68 - - 89 (!) 9 94 %  03/05/16 1345 - - - 94 16  95 %  03/05/16 1342 109/74 97.4 F (36.3 C) - 91 18 95 %     Recent laboratory studies:  Recent Labs  03/05/16 0851  WBC 7.2  HGB 14.2  HCT 41.5  PLT 186  NA 140  K 3.7  CL 109  CO2 23  BUN 15  CREATININE 0.98  GLUCOSE 119*  INR 1.07  CALCIUM 9.5     Discharge Medications:   Allergies as of 03/06/2016      Reactions   No Known Allergies       Medication List    STOP taking these medications   HYDROcodone-acetaminophen 5-325 MG tablet Commonly known as:  NORCO     TAKE these medications   allopurinol 100 MG tablet Commonly known as:  ZYLOPRIM Take 100 mg by mouth daily with breakfast.   atorvastatin 80 MG tablet Commonly known as:   LIPITOR TAKE ONE (1) TABLET BY MOUTH EVERY DAY AT 6PM (KEEP OFFICE VISIT)   carvedilol 6.25 MG tablet Commonly known as:  COREG Take 1 tablet (6.25 mg total) by mouth 2 (two) times daily.   clopidogrel 75 MG tablet Commonly known as:  PLAVIX Take 1 tablet (75 mg total) by mouth daily.   docusate sodium 100 MG capsule Commonly known as:  COLACE Take 1 capsule (100 mg total) by mouth 3 (three) times daily as needed.   ELIQUIS 5 MG Tabs tablet Generic drug:  apixaban Take 5 mg by mouth 2 (two) times daily.   eplerenone 25 MG tablet Commonly known as:  INSPRA Take 1 tablet (25 mg total) by mouth daily.   FLOMAX 0.4 MG Caps capsule Generic drug:  tamsulosin Take 0.4 mg by mouth daily.   furosemide 40 MG tablet Commonly known as:  LASIX Take 40 mg by mouth daily.   lisinopril 2.5 MG tablet Commonly known as:  PRINIVIL,ZESTRIL TAKE ONE (1) TABLET BY MOUTH EVERY DAY What changed:  See the new instructions.   MULTI-VITAMINS Tabs Take 1 tablet by mouth daily.   nitroGLYCERIN 0.4 MG SL tablet Commonly known as:  NITROSTAT Place 1 tablet (0.4 mg total) under the tongue every 5 (five) minutes x 3 doses as needed for chest pain.   oxyCODONE-acetaminophen 5-325 MG tablet Commonly known as:  ROXICET Take 1-2 tablets by mouth every 4 (four) hours as needed for severe pain.   pantoprazole 40 MG tablet Commonly known as:  PROTONIX Take 1 tablet (40 mg total) by mouth daily.   vitamin C 1000 MG tablet Take 1,000 mg by mouth daily.   Vitamin D (Ergocalciferol) 50000 units Caps capsule Commonly known as:  DRISDOL Take 50,000 Units by mouth once a week. Saturdays       Diagnostic Studies: No results found.  Disposition: 01-Home or Self Care  Discharge Instructions    Call MD / Call 911    Complete by:  As directed    If you experience chest pain or shortness of breath, CALL 911 and be transported to the hospital emergency room.  If you develope a fever above 101 F, pus  (white drainage) or increased drainage or redness at the wound, or calf pain, call your surgeon's office.   Call MD / Call 911    Complete by:  As directed    If you experience chest pain or shortness of breath, CALL 911 and be transported to the hospital emergency room.  If you develope a fever above 101 F, pus (white drainage) or increased drainage or redness  at the wound, or calf pain, call your surgeon's office.   Constipation Prevention    Complete by:  As directed    Drink plenty of fluids.  Prune juice may be helpful.  You may use a stool softener, such as Colace (over the counter) 100 mg twice a day.  Use MiraLax (over the counter) for constipation as needed.   Constipation Prevention    Complete by:  As directed    Drink plenty of fluids.  Prune juice may be helpful.  You may use a stool softener, such as Colace (over the counter) 100 mg twice a day.  Use MiraLax (over the counter) for constipation as needed.   Diet - low sodium heart healthy    Complete by:  As directed    Diet - low sodium heart healthy    Complete by:  As directed    Increase activity slowly as tolerated    Complete by:  As directed    Increase activity slowly as tolerated    Complete by:  As directed       Follow-up Information    Nita Sells, MD. Schedule an appointment as soon as possible for a visit on 03/17/2016.   Specialty:  Orthopedic Surgery Contact information: [address] [place] [address] [phone]            Signed: Grier Mitts 03/06/2016, 8:24 AM

## 2016-03-06 NOTE — Progress Notes (Signed)
   PATIENT ID: Johnanna Schneiders   1 Day Post-Op Procedure(s) (LRB): SHOULDER ARTHROSCOPY ROTATOR CUFF REPAIR WITH SUBACROMIAL DECOMPRESSION (Left)  Subjective: Doing well, minimal pain this am and overnight. Ready to go home.   Objective:  Vitals:   03/06/16 0013 03/06/16 0400  BP: (!) 102/56 114/73  Pulse: 87 83  Resp: 16 16  Temp: 97.6 F (36.4 C) 98.1 F (36.7 C)     L  UE dressing c/d/i Sling Wiggles fingers, distally NVI  Labs:   Recent Labs  03/05/16 0851  HGB 14.2   Recent Labs  03/05/16 0851  WBC 7.2  RBC 4.72  HCT 41.5  PLT 186   Recent Labs  03/05/16 0851  NA 140  K 3.7  CL 109  CO2 23  BUN 15  CREATININE 0.98  GLUCOSE 119*  CALCIUM 9.5    Assessment and Plan: 1 day s/p L arthrscopic RCR, kept overnight for observation of untreated OSA No problems overnight, minimal pain D/c today home Percocet for home pain control, wife has already filled it Fu with Dr. Tamera Punt 1-2 weeks  VTE proph: ASA, SCDs

## 2016-03-17 DIAGNOSIS — Z9889 Other specified postprocedural states: Secondary | ICD-10-CM | POA: Diagnosis not present
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
## 2016-03-25 ENCOUNTER — Ambulatory Visit (INDEPENDENT_AMBULATORY_CARE_PROVIDER_SITE_OTHER): Payer: PPO | Admitting: Cardiology

## 2016-03-25 ENCOUNTER — Encounter: Payer: Self-pay | Admitting: Cardiology

## 2016-03-25 VITALS — BP 104/74 | HR 94 | Ht 72.0 in | Wt 239.0 lb

## 2016-03-25 DIAGNOSIS — I255 Ischemic cardiomyopathy: Secondary | ICD-10-CM | POA: Diagnosis not present

## 2016-03-25 DIAGNOSIS — I2510 Atherosclerotic heart disease of native coronary artery without angina pectoris: Secondary | ICD-10-CM

## 2016-03-25 DIAGNOSIS — G4733 Obstructive sleep apnea (adult) (pediatric): Secondary | ICD-10-CM

## 2016-03-25 DIAGNOSIS — Z95810 Presence of automatic (implantable) cardiac defibrillator: Secondary | ICD-10-CM

## 2016-03-25 DIAGNOSIS — I5022 Chronic systolic (congestive) heart failure: Secondary | ICD-10-CM

## 2016-03-25 DIAGNOSIS — Z86718 Personal history of other venous thrombosis and embolism: Secondary | ICD-10-CM

## 2016-03-25 NOTE — Patient Instructions (Signed)
Schedule appointment with Dr. Claiborne Billings for sleep apnea.  Sign release to obtain sleep study results from [facility] in [facility].   Follow up with Dr. Martinique in 6 months.

## 2016-03-26 ENCOUNTER — Ambulatory Visit (INDEPENDENT_AMBULATORY_CARE_PROVIDER_SITE_OTHER): Payer: PPO | Admitting: *Deleted

## 2016-03-26 DIAGNOSIS — I5022 Chronic systolic (congestive) heart failure: Secondary | ICD-10-CM

## 2016-03-26 DIAGNOSIS — I255 Ischemic cardiomyopathy: Secondary | ICD-10-CM

## 2016-03-26 DIAGNOSIS — Z95810 Presence of automatic (implantable) cardiac defibrillator: Secondary | ICD-10-CM

## 2016-03-26 NOTE — Progress Notes (Signed)
Remote ICD transmission.   

## 2016-03-27 NOTE — Progress Notes (Signed)
EPIC Encounter for ICM Monitoring  Patient Name: Kevin Hart is a 71 y.o. male Date: 03/27/2016 Primary Care Physican: Guadlupe Spanish, MD Primary [place] Electrophysiologist: Allred Dry Weight:unknown       Heart Failure questions reviewed, pt asymptomatic.  He had shoulder surgery in December which caused fluid accmulation.  He denied any fluid symptoms today and will start shoulder rehab in the next couple of weeks.   Thoracic impedance normal   Recommendations: No changes.  Reinforced to limit low salt food choices to 2000 mg day and limiting fluid intake to < 2 liters per day. Encouraged to call for fluid symptoms.    Follow-up plan: ICM clinic phone appointment on 05/18/2016 and office appt with Chanetta Marshall, NP on 04/17/2016.  Copy of ICM check sent to device physician.   3 month ICM trend : 03/27/2016   1 Year ICM trend:      Rosalene Billings, RN 03/27/2016 3:30 PM

## 2016-03-31 ENCOUNTER — Other Ambulatory Visit: Payer: Self-pay | Admitting: Cardiology

## 2016-03-31 NOTE — Telephone Encounter (Signed)
Rx has been sent to the pharmacy electronically. ° °

## 2016-04-15 ENCOUNTER — Ambulatory Visit (INDEPENDENT_AMBULATORY_CARE_PROVIDER_SITE_OTHER): Payer: PPO | Admitting: Cardiovascular Disease

## 2016-04-15 ENCOUNTER — Encounter: Payer: Self-pay | Admitting: Cardiovascular Disease

## 2016-04-15 DIAGNOSIS — I255 Ischemic cardiomyopathy: Secondary | ICD-10-CM

## 2016-04-15 DIAGNOSIS — I2510 Atherosclerotic heart disease of native coronary artery without angina pectoris: Secondary | ICD-10-CM | POA: Diagnosis not present

## 2016-04-15 DIAGNOSIS — Z7901 Long term (current) use of anticoagulants: Secondary | ICD-10-CM

## 2016-04-15 DIAGNOSIS — G4730 Sleep apnea, unspecified: Secondary | ICD-10-CM | POA: Diagnosis not present

## 2016-04-15 DIAGNOSIS — Z95810 Presence of automatic (implantable) cardiac defibrillator: Secondary | ICD-10-CM | POA: Diagnosis not present

## 2016-04-15 DIAGNOSIS — Z9889 Other specified postprocedural states: Secondary | ICD-10-CM | POA: Diagnosis not present

## 2016-04-15 DIAGNOSIS — Z86718 Personal history of other venous thrombosis and embolism: Secondary | ICD-10-CM

## 2016-04-15 NOTE — Progress Notes (Signed)
Cardiology Office Note    Date:  04/15/2016   ID:  Kevin, Hart December 16, 1945, MRN [id number]  PCP:  Karle Plumber, MD  Cardiologist:  Dr. Peter Swaziland;  Kevin Guadalajara, MD (sleep)  New sleep evaluation  History of Present Illness:  Kevin Hart is a 71 y.o. male who is followed by Dr. Swaziland for his cardiac history and Dr. Johney Frame for electrophysiology.  Mr. Kevin Hart and suffered an initial myocardial infarction in 2000 which time he underwent PCI to his LAD.  He has a history of multiple left lower leg DVTs for which he is been on Coumadin therapy in the past and most recently eloquence.  In April 2016.  He suffered a non-ST segment elevation MI and was found to have high-grade in-stent restenosis to his LAD and underwent DES stenting.  Due to continued LV dysfunction with an EF of a proximally 30-35%.  He ultimately underwent ICD implantation in September 2016 by Dr. Johney Frame.  He has been followed by Dr.Arbin at [facility] for his primary care.  He complained of symptoms consisting of snoring, frequent awakenings, and has occasionally taken naps in the afternoon.  He ultimately was referred for a sleep study at [facility], which was done on 02/10/2016.  I have reviewed the sleep study in detail.  As was a split-night protocol.  He was found to have severe obstructive sleep apnea with an AHI of 57.3 per hour.  He had 111 desaturations and as well as oxygen saturation was 78%.  He met split-night criteria and initially underwent CPAP titration, but only 29 cm.  He was also noted to have occasional central apneic events and was started on BiPAP initially at 8 over 4 but due to central events was switched to BiPAP ST and was titrated up to maximum pressure of 15/12.  He continued to have events.  His AHI was still increased at 33.2 at this pressure.  The study ended it was felt that he needed to undergo a complete BiPAP titration prior to recommendation of pressure.   Since the sleep lab was out of his insurance network, he presents for sleep evaluation with me and to have follow-up sleep study in [place] in his insurance network.  He goes to bed at approximately 10 PM and wakes up at 7:30 AM.  He snores loudly.  He has noticed frequent awakenings.  He has taken occasional naps in the afternoon.  His sleep is not completely restorative.  He is unaware of nocturnal palpitations.  There is no history of a defibrillator discharge.  He denies any recurrent anginal symptomatology.  He denies any recent symptoms of overt CHF.  An Epworth Sleepiness Scale score was calculated in the office today and this endorsed at 10 and shown below.  Epworth Sleepiness Scale: Situation   Chance of Dozing/Sleeping (0 = never , 1 = slight chance , 2 = moderate chance , 3 = high chance )   sitting and reading 1   watching TV 3   sitting inactive in a public place 1   being a passenger in a motor vehicle for an hour or more 0   lying down in the afternoon 3   sitting and talking to someone 0   sitting quietly after lunch (no alcohol) 2   while stopped for a few minutes in traffic as the driver 0   Total Score  10     Past Medical History:  Diagnosis Date  .  AICD (automatic cardioverter/defibrillator) present   . Anterior myocardial infarction (Kevin Hart) 2000/2016  . Arthritis   . At risk for sudden cardiac death, EF 25-30% with PVCs 07/27/2014  . BPH (benign prostatic hyperplasia)    takes Flomax daily  . Bruise    left upper arm and left AC  . CAD (coronary artery disease)    a. prior LAD stenting;  b. 06/2014 NSTEMI/PCI: LM nl, LAD 30p, 79mISR (3.0x20 Promus DES), LCX nl, RCA nondom, nl, EF 25-30% ant, apical, dist inf AK.  . Cataracts, bilateral    immature  . Chronic systolic CHF (congestive heart failure) ([place] 12/31/2014   takes Furosemide daily  . DVT (deep venous thrombosis) ([facility])    RECURRENT left leg  . Dyslipidemia    takes Atorvastatin daily  . GERD  (gastroesophageal reflux disease)    takes Pantoprazole daily  . Gout    takes Allopurinol daily-pt states his gout is in his heel  . History of kidney stones   . Hypertension    takes Lisinopril and Coreg daily  . Intracranial bleed ([place]    several yrs ago  . Ischemic cardiomyopathy 07/06/14   EF 25-30%  . Lupus anticoagulant positive    on coumadin  . PVC's (premature ventricular contractions)   . S/P drug eluting coronary stent placement, 07/06/14, Promus 4May 06, 2016 . Sleep apnea   . [place]noid bleed [place]   after falling and hitting his head    Past Surgical History:  Procedure Laterality Date  . COLONOSCOPY    . CORONARY ANGIOPLASTY     3 stents  . EP IMPLANTABLE DEVICE [name] 11/27/2014   [employer] Fortify Assura VR  implanted by Dr ARayann Hemanfor primary prevention  . KIDNEY STONE    . LEFT HEART CATHETERIZATION WITH CORONARY ANGIOGRAM [name] 07/06/2014   Procedure: LEFT HEART CATHETERIZATION WITH CORONARY ANGIOGRAM;  Surgeon: Peter M JMartinique MD;  [facility];  Service: Cardiovascular;  Laterality: [name];  . PERCUTANEOUS CORONARY STENT INTERVENTION (PCI-S) Right 07/06/2014   Procedure: PERCUTANEOUS CORONARY STENT INTERVENTION (PCI-S);  Surgeon: Peter M JMartinique MD;  [facility];  Service: Cardiovascular;  Laterality: Right;  . SHOULDER ARTHROSCOPY WITH SUBACROMIAL DECOMPRESSION Left 03/05/2016   Procedure: SHOULDER ARTHROSCOPY ROTATOR CUFF REPAIR WITH SUBACROMIAL DECOMPRESSION;  Surgeon: JTania Ade MD;  [place]  Service: Orthopedics;  Laterality: Left;  SHOULDER ARTHROSCOPY ROTATOR CUFF REPAIR WITH SUBACROMIAL DECOMPRESSION  . SHOULDER CLOSED REDUCTION Left 11/29/2015   Procedure: CLOSED REDUCTION SHOULDER;  Surgeon: JTania Ade MD;  [place]  Service: Orthopedics;  Laterality: Left;  . WRIST SURGERY Bilateral    plates and screws     Current Medications: Outpatient Medications Prior to Visit  Medication Sig Dispense Refill    . allopurinol (ZYLOPRIM) 300 MG tablet Take 1 tablet by mouth daily.    . Ascorbic Acid (VITAMIN C) 1000 MG tablet Take 1,000 mg by mouth daily.    .Marland Kitchenatorvastatin (LIPITOR) 80 MG tablet TAKE ONE (1) TABLET BY MOUTH EVERY DAY AT 6PM (KEEP OFFICE VISIT) 30 tablet 6  . carvedilol (COREG) 6.25 MG tablet TAKE ONE TABLET TWICE DAILY 180 tablet 0  . clopidogrel (PLAVIX) 75 MG tablet Take 1 tablet (75 mg total) by mouth daily. 30 tablet 10  . docusate sodium (COLACE) 100 MG capsule Take 1 capsule (100 mg total) by mouth 3 (three) times daily as needed. 20 capsule 0  . ELIQUIS 5 MG TABS tablet Take 5 mg by  mouth 2 (two) times daily.     Marland Kitchen eplerenone (INSPRA) 25 MG tablet Take 1 tablet (25 mg total) by mouth daily. 30 tablet 6  . furosemide (LASIX) 40 MG tablet Take 40 mg by mouth daily.      Marland Kitchen lisinopril (PRINIVIL,ZESTRIL) 2.5 MG tablet TAKE ONE (1) TABLET BY MOUTH EVERY DAY 30 tablet 4  . Multiple Vitamin (MULTI-VITAMINS) TABS Take 1 tablet by mouth daily.     . nitroGLYCERIN (NITROSTAT) 0.4 MG SL tablet Place 1 tablet (0.4 mg total) under the tongue every 5 (five) minutes x 3 doses as needed for chest pain. 25 tablet 11  . oxyCODONE-acetaminophen (ROXICET) 5-325 MG tablet Take 1-2 tablets by mouth every 4 (four) hours as needed for severe pain. 60 tablet 0  . pantoprazole (PROTONIX) 40 MG tablet Take 1 tablet (40 mg total) by mouth daily. 30 tablet 2  . Tamsulosin HCl (FLOMAX) 0.4 MG CAPS Take 0.4 mg by mouth daily.     . Vitamin D, Ergocalciferol, (DRISDOL) 50000 UNITS CAPS capsule Take 50,000 Units by mouth once a week. Saturdays     No facility-administered medications prior to visit.      Allergies:   No known allergies   Social History   Social History  . Marital status: Widowed    Spouse name: [name]  . Number of children: 6  . Years of education: [name]   Social History Main Topics  . Smoking status: Never Smoker  . Smokeless tobacco: Never Used  . Alcohol use No  . Drug use: No  .  Sexual activity: Yes    Birth control/ protection: None   Other Topics Concern  . None   Social History Narrative   Pt lives in [place] with spouse.  Retired from [employer].  Sold oscilloscopes.    Additional social history is notable in that his first wife died at age 67.  His second marriage last 10 years and one month.  For the past 2 years.  He is now married to his third wife.  Family History:  The patient's family history includes Aneurysm in his father.   ROS General: Negative; No fevers, chills, or night sweats;  HEENT: Negative; No changes in vision or hearing, sinus congestion, difficulty swallowing Pulmonary: Negative; No cough, wheezing, shortness of breath, hemoptysis Cardiovascular: see HPI GI: Negative; No nausea, vomiting, diarrhea, or abdominal pain GU: Negative; No dysuria, hematuria, or difficulty voiding Musculoskeletal: Negative; no myalgias, joint pain, or weakness Hematologic/Oncology: Negative; no easy bruising, bleeding Endocrine: Negative; no heat/cold intolerance; no diabetes Neuro: Negative; no changes in balance, headaches Skin: Negative; No rashes or skin lesions Psychiatric: Negative; No behavioral problems, depression Sleep: [place] for severe sleep apnea on his recent sleep study; positive for snoring, daytime sleepiness, hypersomnolence; nobruxism, restless legs, hypnogognic hallucinations, no cataplexy Other comprehensive 14 point system review is negative.   PHYSICAL EXAM:   VS:  BP 114/70   Pulse 87   Ht 6' (1.829 m)   Wt 240 lb 3.2 oz (109 kg)   BMI 32.58 kg/m     Repeat blood pressure 110/70  Wt Readings from Last 3 Encounters:  04/15/16 240 lb 3.2 oz (109 kg)  03/25/16 239 lb (108.4 kg)  03/05/16 244 lb 14.9 oz (111.1 kg)    General: Alert, oriented, no distress.  Skin: normal turgor, no rashes, warm and dry HEENT: Normocephalic, atraumatic. Bilateral mild arcus senilis; Pupils equal round and reactive to light; sclera  anicteric; extraocular muscles intact; Fundi  Without hemorrhages or exudates. Nose without nasal septal hypertrophy Mouth/Parynx benign; Mallinpatti scale 3 Neck: No JVD, no carotid bruits; normal carotid upstroke Lungs: clear to ausculatation and percussion; no wheezing or rales Chest wall: without tenderness to palpitation Heart: PMI not displaced, RRR, s1 s2 normal, 1/6 systolic murmur, no diastolic murmur, no rubs, gallops, thrills, or heaves Abdomen: soft, nontender; no hepatosplenomehaly, BS+; abdominal aorta nontender and not dilated by palpation. Back: no CVA tenderness Pulses 2+ Musculoskeletal: full range of motion, normal strength, no joint deformities Extremities: no clubbing cyanosis or edema, Homan's sign negative  Neurologic: grossly nonfocal; Cranial nerves grossly wnl Psychologic: Normal mood and affect   Studies/Labs Reviewed:   EKG:  EKG is ordered today.  The ekg Independently reviewed by me shows normal sinus rhythm at 87 bpm.  There was mild first-degree AV block with a PR interval of 22 ms.  Q wave is present in 3 and aVF.  QRS complex V1 and V2.  Nonspecific ST changes.  Recent Labs: BMP Latest Ref Rng & Units 03/05/2016 11/28/2015 10/17/2015  Glucose 65 - 99 mg/dL 119(H) 174(H) 120(H)  BUN 6 - 20 mg/dL 15 29(H) 15  Creatinine 0.61 - 1.24 mg/dL 0.98 1.39(H) 1.09  Sodium 135 - 145 mmol/L 140 136 140  Potassium 3.5 - 5.1 mmol/L 3.7 3.8 4.1  Chloride 101 - 111 mmol/L 109 103 105  CO2 22 - 32 mmol/L '23 24 24  '$ Calcium 8.9 - 10.3 mg/dL 9.5 9.6 9.9     Hepatic Function Latest Ref Rng & Units 07/25/2014  Total Protein 6.0 - 8.3 g/dL 8.0  Albumin 3.5 - 5.2 g/dL 4.3  AST 0 - 37 U/L 24  ALT 0 - 53 U/L 25  Alk Phosphatase 39 - 117 U/L 48  Total Bilirubin 0.2 - 1.2 mg/dL 0.8  Bilirubin, Direct 0.0 - 0.3 mg/dL 0.2    CBC Latest Ref Rng & Units 03/05/2016 11/28/2015 03/16/2015  WBC 4.0 - 10.5 K/uL 7.2 11.3(H) 6.8  Hemoglobin 13.0 - 17.0 g/dL 14.2 14.9 13.9  Hematocrit  39.0 - 52.0 % 41.5 42.4 40.6  Platelets 150 - 400 K/uL 186 206 191   Lab Results  Component Value Date   MCV 87.9 03/05/2016   MCV 88.7 11/28/2015   MCV 89.2 03/16/2015   Lab Results  Component Value Date   TSH 1.25 04/09/2009   No results found for: HGBA1C   BNP    Component Value Date/Time   BNP 166.0 (H) 03/16/2015 0010    ProBNP No results found for: PROBNP   Lipid Panel     Component Value Date/Time   CHOL 113 07/25/2014 1029   TRIG 122.0 07/25/2014 1029   HDL 31.90 (L) 07/25/2014 1029   CHOLHDL 4 07/25/2014 1029   VLDL 24.4 07/25/2014 1029   LDLCALC 57 07/25/2014 1029     RADIOLOGY: No results found.   Additional studies/ records that were reviewed today include:  [facility] Sleep report; cardiology records    ASSESSMENT:    1. Severe sleep apnea   2. Coronary artery disease involving native coronary artery of native heart without angina pectoris   3. Cardiomyopathy, ischemic   4. ICD (implantable cardioverter-defibrillator) in place   5. History of DVT (deep vein thrombosis)   6. Anticoagulation adequate      PLAN:  Mr. Mizraim Harmening is a 71 year old Caucasian male who has significant cardiovascular comorbidities and is status post 2 prior MIs and stenting of his LAD.  He has an  ischemic cardiomyopathy and has had issues with chronic heart failure, probably combined.  He status post ICD implantation for persistent reduced LV function.  He was recently found to have severe obstructive sleep apnea with an AHI of 57.3 per hour, and was titrated initially with CPAP and ultimately BiPAP therapy.  He was noted to have central events and was transitioned to BiPAP ST mode.  On his titration portion of his split-night studyhe was titrated up to 15/12 but continued to have an elevated AHI and titration was suboptimal.  I agree that a BiPAP titration would be beneficial for optimal management, particularly since an ST mode may be necessary if he  continues to demonstrate central events.  At present, he appears to be euvolemic and not having signs or symptoms of overt CHF.  It CHF is present, the prolonged circulatory time, can contribute to central events.  I personally called the [place] sleep lab today and arrange for expeditious BiPAP titration.  Unfortunately, was able to schedule this to be done in the next 5 days.  On 04/20/2016.  Once his sleep study is completed, he will then be set up with a DME company and I will see him in a sleep clinic for follow-up evaluation.  I had a long discussion with him today concerning the effects of sleep apnea on cardiac disease and particularly with reference to hypertension, nocturnal arrhythmias, and potential ischemic symptoms.  On the diagnostic portion of his sleep study, he was not able to achieve rems sleep.  I discussed with him the importance of utilizing therapy for the entire night, particularly since the preponderance of REM sleep occurs in the second half of the night.  I answered all his questions.  I reviewed recent laboratory.  I will see him in a sleep clinic follow-up evaluation following institution of therapy.   Medication Adjustments/Labs and Tests Ordered: Current medicines are reviewed at length with the patient today.  Concerns regarding medicines are outlined above.  Medication changes, Labs and Tests ordered today are listed in the Patient Instructions below.  Patient Instructions  Medication Instructions:  Your physician recommends that you continue on your current medications as directed. Please refer to the Current Medication list given to you today.  Labwork: None   Testing/Procedures: BI-PAP TITRATION WITH POSSIBILITY OF BI-PAP ST  Follow-Up: Your physician recommends that you schedule a follow-up appointment in: SLEEP CLINIC AFTER BI-PAP TITATRTION  Any Other Special Instructions Will Be Listed Below (If Applicable).  TEST HAS BEEN TEST UP FOR April 20, 2016  AT [place] PATIENT MUST ARRIVE AT 8PM; PATIENT MADE AWARE AND VOICED UNDERSTANDING  If you need a refill on your cardiac medications before your next appointment, please call your pharmacy.     Time spent: 40 minutes Signed, Shelva Majestic, MD, [facility] 04/15/2016 7:04 PM    [facility] [address], [place], [place], [place]  [address] Phone: [phone]

## 2016-04-15 NOTE — Patient Instructions (Addendum)
Medication Instructions:  Your physician recommends that you continue on your current medications as directed. Please refer to the Current Medication list given to you today.  Labwork: None   Testing/Procedures: BI-PAP TITRATION WITH POSSIBILITY OF BI-PAP ST  Follow-Up: Your physician recommends that you schedule a follow-up appointment in: SLEEP CLINIC AFTER BI-PAP TITATRTION  Any Other Special Instructions Will Be Listed Below (If Applicable).  TEST HAS BEEN TEST UP FOR April 20, 2016 AT [place] PATIENT MUST ARRIVE AT 8PM; PATIENT MADE AWARE AND VOICED UNDERSTANDING  If you need a refill on your cardiac medications before your next appointment, please call your pharmacy.

## 2016-04-16 ENCOUNTER — Encounter: Payer: Self-pay | Admitting: Nurse Practitioner

## 2016-04-16 NOTE — Progress Notes (Deleted)
Electrophysiology Office Note Date: 04/16/2016  ID:  Kevin Hart, Kevin Hart [birth year], MRN [id number]  PCP: Guadlupe Spanish, MD Primary Cardiologist: Martinique Electrophysiologist: Allred  CC: Routine ICD follow-up  Kevin Hart is a 71 y.o. male seen today for Dr Rayann Heman.  He presents today for routine electrophysiology followup.  Since last being seen in our clinic, the patient reports doing very well. He denies chest pain, palpitations, dyspnea, PND, orthopnea, nausea, vomiting, dizziness, syncope, edema, weight gain, or early satiety.  He has not had ICD shocks.   Device History: STJ single chamber ICD implanted 2016 for ICM History of appropriate therapy: No History of AAD therapy: No   Past Medical History:  Diagnosis Date  . Arthritis   . BPH (benign prostatic hyperplasia)    takes Flomax daily  . CAD (coronary artery disease)    a. prior LAD stenting;  b. 06/2014 NSTEMI/PCI: LM nl, LAD 30p, 59m ISR (3.0x20 Promus DES), LCX nl, RCA nondom, nl, EF 25-30% ant, apical, dist inf AK.  . Cataracts, bilateral    immature  . Chronic systolic CHF (congestive heart failure) ([place]) 12/31/2014  . DVT (deep venous thrombosis) ([facility])    RECURRENT left leg  . Dyslipidemia   . GERD (gastroesophageal reflux disease)   . Gout   . History of kidney stones   . Hypertension   . Ischemic cardiomyopathy 07/06/2014  . Lupus anticoagulant positive   . S/P drug eluting coronary stent placement, 07/06/14, Promus 07/07/2014  . Sleep apnea   . Subarachnoid bleed ([place]) 2015   after falling and hitting his head   Past Surgical History:  Procedure Laterality Date  . COLONOSCOPY    . EP IMPLANTABLE DEVICE [name] 11/27/2014   St. Jude Medical Fortify Assura VR  implanted by Dr Rayann Heman for primary prevention  . LEFT HEART CATHETERIZATION WITH CORONARY ANGIOGRAM [name] 07/06/2014   Procedure: LEFT HEART CATHETERIZATION WITH CORONARY ANGIOGRAM;  Surgeon: Peter M Martinique, MD;  Location: [facility] CATH LAB;  Service:  Cardiovascular;  Laterality: [name];  . PERCUTANEOUS CORONARY STENT INTERVENTION (PCI-S) Right 07/06/2014   Procedure: PERCUTANEOUS CORONARY STENT INTERVENTION (PCI-S);  Surgeon: Peter M Martinique, MD;  Location: [facility] CATH LAB;  Service: Cardiovascular;  Laterality: Right;  . SHOULDER ARTHROSCOPY WITH SUBACROMIAL DECOMPRESSION Left 03/05/2016   Procedure: SHOULDER ARTHROSCOPY ROTATOR CUFF REPAIR WITH SUBACROMIAL DECOMPRESSION;  Surgeon: Tania Ade, MD;  Location: [place];  Service: Orthopedics;  Laterality: Left;  SHOULDER ARTHROSCOPY ROTATOR CUFF REPAIR WITH SUBACROMIAL DECOMPRESSION  . SHOULDER CLOSED REDUCTION Left 11/29/2015   Procedure: CLOSED REDUCTION SHOULDER;  Surgeon: Tania Ade, MD;  Location: [place];  Service: Orthopedics;  Laterality: Left;  . WRIST SURGERY Bilateral    plates and screws     Current Outpatient Prescriptions  Medication Sig Dispense Refill  . allopurinol (ZYLOPRIM) 300 MG tablet Take 1 tablet by mouth daily.    . Ascorbic Acid (VITAMIN C) 1000 MG tablet Take 1,000 mg by mouth daily.    Marland Kitchen atorvastatin (LIPITOR) 80 MG tablet TAKE ONE (1) TABLET BY MOUTH EVERY DAY AT 6PM (KEEP OFFICE VISIT) 30 tablet 6  . carvedilol (COREG) 6.25 MG tablet TAKE ONE TABLET TWICE DAILY 180 tablet 0  . clopidogrel (PLAVIX) 75 MG tablet Take 1 tablet (75 mg total) by mouth daily. 30 tablet 10  . docusate sodium (COLACE) 100 MG capsule Take 1 capsule (100 mg total) by mouth 3 (three) times daily as needed. 20 capsule 0  . ELIQUIS 5 MG TABS tablet Take  5 mg by mouth 2 (two) times daily.     Marland Kitchen eplerenone (INSPRA) 25 MG tablet Take 1 tablet (25 mg total) by mouth daily. 30 tablet 6  . furosemide (LASIX) 40 MG tablet Take 40 mg by mouth daily.      Marland Kitchen lisinopril (PRINIVIL,ZESTRIL) 2.5 MG tablet TAKE ONE (1) TABLET BY MOUTH EVERY DAY 30 tablet 4  . Multiple Vitamin (MULTI-VITAMINS) TABS Take 1 tablet by mouth daily.     . nitroGLYCERIN (NITROSTAT) 0.4 MG SL tablet Place 1 tablet (0.4 mg total) under  the tongue every 5 (five) minutes x 3 doses as needed for chest pain. 25 tablet 11  . oxyCODONE-acetaminophen (ROXICET) 5-325 MG tablet Take 1-2 tablets by mouth every 4 (four) hours as needed for severe pain. 60 tablet 0  . pantoprazole (PROTONIX) 40 MG tablet Take 1 tablet (40 mg total) by mouth daily. 30 tablet 2  . Tamsulosin HCl (FLOMAX) 0.4 MG CAPS Take 0.4 mg by mouth daily.     . Vitamin D, Ergocalciferol, (DRISDOL) 50000 UNITS CAPS capsule Take 50,000 Units by mouth once a week. Saturdays     No current facility-administered medications for this visit.     Allergies:   No known allergies   Social History: Social History   Social History  . Marital status: Widowed    Spouse name: [name]  . Number of children: 6  . Years of education: [name]   Occupational History  . Not on file.   Social History Main Topics  . Smoking status: Never Smoker  . Smokeless tobacco: Never Used  . Alcohol use No  . Drug use: No  . Sexual activity: Yes    Birth control/ protection: None   Other Topics Concern  . Not on file   Social History Narrative   Pt lives in [place] with spouse.  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    Family History: Family History  Problem Relation Age of Onset  . Aneurysm Father     Review of Systems: All other systems reviewed and are otherwise negative except as noted above.   Physical Exam: VS:  There were no vitals taken for this visit. , BMI There is no height or weight on file to calculate BMI.  GEN- The patient is well appearing, alert and oriented x 3 today.   HEENT: normocephalic, atraumatic; sclera clear, conjunctiva pink; hearing intact; oropharynx clear; neck supple, no JVP Lymph- no cervical lymphadenopathy Lungs- Clear to ausculation bilaterally, normal work of breathing.  No wheezes, rales, rhonchi Heart- Regular rate and rhythm, no murmurs, rubs or gallops, PMI not laterally displaced GI- soft, non-tender, non-distended, bowel  sounds present, no hepatosplenomegaly Extremities- no clubbing, cyanosis, or edema; DP/PT/radial pulses 2+ bilaterally MS- no significant deformity or atrophy Skin- warm and dry, no rash or lesion; ICD pocket well healed Psych- euthymic mood, full affect Neuro- strength and sensation are intact  ICD interrogation- reviewed in detail today,  See PACEART report  EKG:  EKG is not ordered today.  Recent Labs: 03/05/2016: BUN 15; Creatinine, Ser 0.98; Hemoglobin 14.2; Platelets 186; Potassium 3.7; Sodium 140   Wt Readings from Last 3 Encounters:  04/15/16 240 lb 3.2 oz (109 kg)  03/25/16 239 lb (108.4 kg)  03/05/16 244 lb 14.9 oz (111.1 kg)     Other studies Reviewed: Additional studies/ records that were reviewed today include: Dr Rayann Heman, Dr Claiborne Billings, Dr Doug Sou office notes  Assessment and Plan:  1.  Chronic systolic dysfunction euvolemic  today Stable on an appropriate medical regimen Normal ICD function See Pace Art report No changes today Continue follow up in [facility] clinic Device not on battery advisory per STJ website today.   2.  OSA Compliance with BiPAP advised Following with Dr Claiborne Billings  3.  CAD No recent ischemic symptoms Continue medical therapy  4.  Recurrent DVT with positive lupus anticoagulant On long term anticoagulation with Eliquis No bleeding issues on Eliquis+Plavix currently Per Dr Doug Sou last note, with 2 layers of stent in LAD would prefer to continue double therapy as long as no bleeding issues.    Current medicines are reviewed at length with the patient today.   The patient does not have concerns regarding his medicines.  The following changes were made today:  none  Labs/ tests ordered today include: none No orders of the defined types were placed in this encounter.    Disposition:   Follow up with Delilah Shan, ICM clinic, Dr Martinique and Dr Claiborne Billings as scheduled, Dr Rayann Heman 1 year      Signed, Chanetta Marshall, NP 04/16/2016 9:51 AM  [facility] [address] [place] [address] [phone] (office) [phone] (fax)

## 2016-04-17 ENCOUNTER — Encounter: Payer: PPO | Admitting: Nurse Practitioner

## 2016-04-20 ENCOUNTER — Ambulatory Visit (HOSPITAL_BASED_OUTPATIENT_CLINIC_OR_DEPARTMENT_OTHER): Payer: PPO | Attending: Cardiovascular Disease | Admitting: Cardiovascular Disease

## 2016-04-20 VITALS — Ht 72.0 in | Wt 225.0 lb

## 2016-04-20 DIAGNOSIS — G4730 Sleep apnea, unspecified: Secondary | ICD-10-CM | POA: Diagnosis not present

## 2016-04-20 DIAGNOSIS — G4731 Primary central sleep apnea: Secondary | ICD-10-CM | POA: Insufficient documentation

## 2016-04-20 DIAGNOSIS — G4733 Obstructive sleep apnea (adult) (pediatric): Secondary | ICD-10-CM | POA: Insufficient documentation

## 2016-04-29 DIAGNOSIS — Z4789 Encounter for other orthopedic aftercare: Secondary | ICD-10-CM | POA: Diagnosis not present

## 2016-04-29 DIAGNOSIS — M25512 Pain in left shoulder: Secondary | ICD-10-CM | POA: Diagnosis not present

## 2016-04-29 DIAGNOSIS — M75112 Incomplete rotator cuff tear or rupture of left shoulder, not specified as traumatic: Secondary | ICD-10-CM | POA: Diagnosis not present

## 2016-04-29 NOTE — Progress Notes (Signed)
Electrophysiology Office Note Date: 04/30/2016  ID:  Kevin Hart, Kevin Hart September 22, 1945, MRN [id number]  PCP: Guadlupe Spanish, MD Primary Cardiologist: Martinique Electrophysiologist: Allred  CC: Routine ICD follow-up  Kevin Hart is a 71 y.o. male seen today for Dr Rayann Heman.  He presents today for routine electrophysiology followup.  Since last being seen in our clinic, the patient reports doing very well. He denies chest pain, palpitations, dyspnea, PND, orthopnea, nausea, vomiting, dizziness, syncope, edema, weight gain, or early satiety.  He has not had ICD shocks.   Device History: STJ single chamber ICD implanted 2016 for ICM History of appropriate therapy: No History of AAD therapy: No   Past Medical History:  Diagnosis Date  . Arthritis   . BPH (benign prostatic hyperplasia)    takes Flomax daily  . CAD (coronary artery disease)    a. prior LAD stenting;  b. 06/2014 NSTEMI/PCI: LM nl, LAD 30p, 72m ISR (3.0x20 Promus DES), LCX nl, RCA nondom, nl, EF 25-30% ant, apical, dist inf [place].  . Cataracts, bilateral    immature  . Chronic systolic CHF (congestive heart failure) ([place]) 12/31/2014  . DVT (deep venous thrombosis) ([facility])    RECURRENT left leg  . Dyslipidemia   . GERD (gastroesophageal reflux disease)   . Gout   . History of kidney stones   . Hypertension   . Ischemic cardiomyopathy 07/06/2014  . Lupus anticoagulant positive   . S/P drug eluting coronary stent placement, 07/06/14, Promus 07/07/2014  . Sleep apnea   . Subarachnoid bleed ([place]) 2015   after falling and hitting his head   Past Surgical History:  Procedure Laterality Date  . COLONOSCOPY    . EP IMPLANTABLE DEVICE [name] 11/27/2014   St. Jude Medical Fortify Assura VR  implanted by Dr Rayann Heman for primary prevention  . LEFT HEART CATHETERIZATION WITH CORONARY ANGIOGRAM [name] 07/06/2014   Procedure: LEFT HEART CATHETERIZATION WITH CORONARY ANGIOGRAM;  Surgeon: Peter M Martinique, MD;  Location: [facility] CATH LAB;  Service:  Cardiovascular;  Laterality: [name];  . PERCUTANEOUS CORONARY STENT INTERVENTION (PCI-S) Right 07/06/2014   Procedure: PERCUTANEOUS CORONARY STENT INTERVENTION (PCI-S);  Surgeon: Peter M Martinique, MD;  Location: [facility] CATH LAB;  Service: Cardiovascular;  Laterality: Right;  . SHOULDER ARTHROSCOPY WITH SUBACROMIAL DECOMPRESSION Left 03/05/2016   Procedure: SHOULDER ARTHROSCOPY ROTATOR CUFF REPAIR WITH SUBACROMIAL DECOMPRESSION;  Surgeon: Tania Ade, MD;  Location: [place];  Service: Orthopedics;  Laterality: Left;  SHOULDER ARTHROSCOPY ROTATOR CUFF REPAIR WITH SUBACROMIAL DECOMPRESSION  . SHOULDER CLOSED REDUCTION Left 11/29/2015   Procedure: CLOSED REDUCTION SHOULDER;  Surgeon: Tania Ade, MD;  Location: [place];  Service: Orthopedics;  Laterality: Left;  . WRIST SURGERY Bilateral    plates and screws     Current Outpatient Prescriptions  Medication Sig Dispense Refill  . allopurinol (ZYLOPRIM) 300 MG tablet Take 1 tablet by mouth daily.    . Ascorbic Acid (VITAMIN C) 1000 MG tablet Take 1,000 mg by mouth daily.    Marland Kitchen atorvastatin (LIPITOR) 80 MG tablet TAKE ONE (1) TABLET BY MOUTH EVERY DAY AT 6PM (KEEP OFFICE VISIT) 30 tablet 6  . carvedilol (COREG) 6.25 MG tablet TAKE ONE TABLET TWICE DAILY 180 tablet 0  . clopidogrel (PLAVIX) 75 MG tablet Take 1 tablet (75 mg total) by mouth daily. 30 tablet 10  . docusate sodium (COLACE) 100 MG capsule Take 1 capsule (100 mg total) by mouth 3 (three) times daily as needed. 20 capsule 0  . ELIQUIS 5 MG TABS tablet Take  5 mg by mouth 2 (two) times daily.     Marland Kitchen eplerenone (INSPRA) 25 MG tablet Take 1 tablet (25 mg total) by mouth daily. 30 tablet 6  . furosemide (LASIX) 40 MG tablet Take 40 mg by mouth daily.      Marland Kitchen lisinopril (PRINIVIL,ZESTRIL) 2.5 MG tablet TAKE ONE (1) TABLET BY MOUTH EVERY DAY 30 tablet 4  . Multiple Vitamin (MULTI-VITAMINS) TABS Take 1 tablet by mouth daily.     . nitroGLYCERIN (NITROSTAT) 0.4 MG SL tablet Place 1 tablet (0.4 mg total) under  the tongue every 5 (five) minutes x 3 doses as needed for chest pain. 25 tablet 11  . oxyCODONE-acetaminophen (ROXICET) 5-325 MG tablet Take 1-2 tablets by mouth every 4 (four) hours as needed for severe pain. 60 tablet 0  . pantoprazole (PROTONIX) 40 MG tablet Take 1 tablet (40 mg total) by mouth daily. 30 tablet 2  . Tamsulosin HCl (FLOMAX) 0.4 MG CAPS Take 0.4 mg by mouth daily.     . Vitamin D, Ergocalciferol, (DRISDOL) 50000 UNITS CAPS capsule Take 50,000 Units by mouth once a week. Saturdays     No current facility-administered medications for this visit.     Allergies:   No known allergies   Social History: Social History   Social History  . Marital status: Married    Spouse name: [name]  . Number of children: 6  . Years of education: [name]   Occupational History  . Not on file.   Social History Main Topics  . Smoking status: Never Smoker  . Smokeless tobacco: Never Used  . Alcohol use No  . Drug use: No  . Sexual activity: Yes    Birth control/ protection: None   Other Topics Concern  . Not on file   Social History Narrative   Pt lives in [place] with spouse.  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    Family History: Family History  Problem Relation Age of Onset  . Aneurysm Father     Review of Systems: All other systems reviewed and are otherwise negative except as noted above.   Physical Exam: VS:  BP 110/74   Pulse 99   Ht 6' (1.829 m)   Wt 237 lb (107.5 kg)   BMI 32.14 kg/m  , BMI Body mass index is 32.14 kg/m.  GEN- The patient is elderly appearing, alert and oriented x 3 today.   HEENT: normocephalic, atraumatic; sclera clear, conjunctiva pink; hearing intact; oropharynx clear; neck supple  Lungs- Clear to ausculation bilaterally, normal work of breathing.  No wheezes, rales, rhonchi Heart- Regular rate and rhythm  GI- soft, non-tender, non-distended, bowel sounds present  Extremities- no clubbing, cyanosis, or edema; DP/PT/radial  pulses 2+ bilaterally MS- no significant deformity or atrophy Skin- warm and dry, no rash or lesion; ICD pocket well healed Psych- euthymic mood, full affect Neuro- strength and sensation are intact  ICD interrogation- reviewed in detail today,  See PACEART report  EKG:  EKG is not ordered today.  Recent Labs: 03/05/2016: BUN 15; Creatinine, Ser 0.98; Hemoglobin 14.2; Platelets 186; Potassium 3.7; Sodium 140   Wt Readings from Last 3 Encounters:  04/30/16 237 lb (107.5 kg)  04/20/16 225 lb (102.1 kg)  04/15/16 240 lb 3.2 oz (109 kg)     Other studies Reviewed: Additional studies/ records that were reviewed today include: Dr Rayann Heman, Dr Claiborne Billings, Dr Doug Sou office notes  Assessment and Plan:  1.  Chronic systolic dysfunction euvolemic today Stable on an  appropriate medical regimen Normal ICD function See Pace Art report No changes today Continue follow up in [facility] clinic Device not on battery advisory per STJ website today.   2.  OSA Compliance with BiPAP advised Following with Dr Claiborne Billings  3.  CAD No recent ischemic symptoms Continue medical therapy  4.  Recurrent DVT with positive lupus anticoagulant On long term anticoagulation with Eliquis No bleeding issues on Eliquis+Plavix currently Per Dr Doug Sou last note, with 2 layers of stent in LAD would prefer to continue double therapy as long as no bleeding issues.    Current medicines are reviewed at length with the patient today.   The patient does not have concerns regarding his medicines.  The following changes were made today:  none  Labs/ tests ordered today include: none Orders Placed This Encounter  Procedures  . CUP PACEART INCLINIC DEVICE CHECK     Disposition:   Follow up with Delilah Shan, ICM clinic, Dr Martinique and Dr Claiborne Billings as scheduled, Dr Rayann Heman 1 year      Signed, Chanetta Marshall, NP 04/30/2016 8:52 AM  [facility] [address] [place] [address] [phone]  (office) [phone] (fax)

## 2016-04-30 ENCOUNTER — Ambulatory Visit (INDEPENDENT_AMBULATORY_CARE_PROVIDER_SITE_OTHER): Payer: PPO | Admitting: Nurse Practitioner

## 2016-04-30 VITALS — BP 110/74 | HR 99 | Ht 72.0 in | Wt 237.0 lb

## 2016-04-30 DIAGNOSIS — G4733 Obstructive sleep apnea (adult) (pediatric): Secondary | ICD-10-CM | POA: Diagnosis not present

## 2016-04-30 DIAGNOSIS — R760 Raised antibody titer: Secondary | ICD-10-CM

## 2016-04-30 DIAGNOSIS — I5022 Chronic systolic (congestive) heart failure: Secondary | ICD-10-CM | POA: Diagnosis not present

## 2016-04-30 LAB — CUP PACEART INCLINIC DEVICE CHECK
Date Time Interrogation Session: 20180208085230
Implantable Lead Implant Date: 20160906
Implantable Lead Location: 753860
Implantable Pulse Generator Implant Date: 20160906
Pulse Gen Serial Number: 7257239

## 2016-04-30 NOTE — Patient Instructions (Signed)
Medication Instructions:   Your physician recommends that you continue on your current medications as directed. Please refer to the Current Medication list given to you today.   If you need a refill on your cardiac medications before your next appointment, please call your pharmacy.  Labwork: NONE ORDERED  TODAY    Testing/Procedures: NONE ORDERED  TODAY    Follow-Up:  Your physician wants you to follow-up in: [place] will receive a reminder letter in the mail two months in advance. If you don't receive a letter, please call our office to schedule the follow-up appointment.  Remote monitoring is used to monitor your Pacemaker of ICD from home. This monitoring reduces the number of office visits required to check your device to one time per year. It allows [place] to keep an eye on the functioning of your device to ensure it is working properly. You are scheduled for a device check from home on .07/29/2016 You may send your transmission at any time that day. If you have a wireless device, the transmission will be sent automatically. After your physician reviews your transmission, you will receive a postcard with your next transmission date.    Any Other Special Instructions Will Be Listed Below (If Applicable).

## 2016-05-02 NOTE — Procedures (Signed)
Patient Name: Kevin Hart, Pruyn Date: 04/20/2016 Gender: Male D.O.B: Dec 31, 1945 Age (years): 35 Referring Provider: Shelva Majestic MD, ABSM Height (inches): 72 Interpreting Physician: Shelva Majestic MD, ABSM Weight (lbs): 225 RPSGT: Madelon Lips BMI: 31 MRN: [id number] Neck Size: 16.50  CLINICAL INFORMATION The patient is referred for a BiPAP titration with possible ST mode due to frequent central events noted on prior titration.  Date of NPSG, Split Night or HST: 02/10/2016 at [facility]:  AHI 57.3 with CPAP/BiPAP titration.  SLEEP STUDY TECHNIQUE As per the AASM Manual for the Scoring of Sleep and Associated Events v2.3 (April 2016) with a hypopnea requiring 4% desaturations.  The channels recorded and monitored were frontal, central and occipital EEG, electrooculogram (EOG), submentalis EMG (chin), nasal and oral airflow, thoracic and abdominal wall motion, anterior tibialis EMG, snore microphone, electrocardiogram, and pulse oximetry. Bilevel positive airway pressure (BPAP) was initiated at the beginning of the study and titrated to treat sleep-disordered breathing.  MEDICATIONS allopurinol (ZYLOPRIM) 300 MG tablet Ascorbic Acid (VITAMIN C) 1000 MG tablet atorvastatin (LIPITOR) 80 MG tablet carvedilol (COREG) 6.25 MG tablet clopidogrel (PLAVIX) 75 MG tablet docusate sodium (COLACE) 100 MG capsule ELIQUIS 5 MG TABS tablet eplerenone (INSPRA) 25 MG tablet furosemide (LASIX) 40 MG tablet lisinopril (PRINIVIL,ZESTRIL) 2.5 MG tablet Multiple Vitamin (MULTI-VITAMINS) TABS nitroGLYCERIN (NITROSTAT) 0.4 MG SL tablet oxyCODONE-acetaminophen (ROXICET) 5-325 MG tablet pantoprazole (PROTONIX) 40 MG tablet Tamsulosin HCl (FLOMAX) 0.4 MG CAPS Vitamin D, Ergocalciferol, (DRISDOL) 50000 UNITS CAPS capsule  Medications self-administered by patient taken the night of the study : [name]  RESPIRATORY PARAMETERS Optimal IPAP Pressure (cm): 15 AHI at Optimal Pressure (/hr) 2.1 Optimal  EPAP Pressure (cm): 11   Overall Minimal O2 (%): 87.00 Minimal O2 at Optimal Pressure (%): 91.0 SLEEP ARCHITECTURE Start Time: 9:46:28 PM Stop Time: 5:03:07 AM Total Time (min): 436.7 Total Sleep Time (min): 296.0 Sleep Latency (min): 6.6 Sleep Efficiency (%): 67.8 REM Latency (min): 102.5 WASO (min): 134.0 Stage N1 (%): 14.36 Stage N2 (%): 62.00 Stage N3 (%): 2.03 Stage R (%): 21.62 Supine (%): 33.44 Arousal Index (/hr): 16.2      CARDIAC DATA The 2 lead EKG demonstrated sinus rhythm, pacemaker generated. The mean heart rate was 67.33 beats per minute. Other EKG findings include: PVCs.  LEG MOVEMENT DATA The total Periodic Limb Movements of Sleep (PLMS) were 0. The PLMS index was 0.00. A PLMS index of <15 is considered normal in adults.  IMPRESSIONS - The patient was titrated with BiPAP initially at 8/4 and was titrated up to 15/11.  There were frequent central events at low to medium  pressures (9 central events at 11/7; AHI 44.5/h) and 1 central apnea at 15/11 (AHI 2.1/h).    BiPAP ST was never implemented.  - Mild Central Sleep Apnea was noted during this titration (CAI = 5.3/h). - Mild oxygen desaturations to a nadir of 87% at 9/5 cm water pressure. - The patient snored with initial loud snoring volume. - 2-lead EKG demonstrated: PVCs - Clinically significant periodic limb movements were not noted during this study. Arousals associated with PLMs were rare.  DIAGNOSIS - Obstructive Sleep Apnea (327.23 [G47.33 ICD-10]) - Central sleep apnea  RECOMMENDATIONS - Recommend an initial trial of BiPAP therapy at 15/11 cm H2O with heated humidification.  However, would initiate therapy with a Bi-PAP Auto unit with ST mode capability in event patient continues to have central events at high pressure on subsequent downloads.  A Medium size Fisher[employer]Paykel Full Face Mask Simplus mask was used  for the titration.   - Efforts should be made to optimize treatment for CHF, and patency for nasal and  oropharyngeal airway. - Avoid alcohol, sedatives and other CNS depressants that may worsen sleep apnea and disrupt normal sleep architecture. - Sleep hygiene should be reviewed to assess factors that may improve sleep quality. - Weight management and regular exercise should be initiated or continued. - Recommend a download be obtained in 30 days and sleep clinic re-evaluation after 4 weeks of therapy  [Electronically signed] 05/02/2016 12:35 PM  Shelva Majestic MD, [facility], ABSM Diplomate, American Board of Sleep Medicine   NPI: [id number] Center PH: [id number]   FX: [phone] [place]

## 2016-05-04 DIAGNOSIS — Z4789 Encounter for other orthopedic aftercare: Secondary | ICD-10-CM | POA: Diagnosis not present

## 2016-05-04 DIAGNOSIS — M25512 Pain in left shoulder: Secondary | ICD-10-CM | POA: Diagnosis not present

## 2016-05-04 DIAGNOSIS — M75112 Incomplete rotator cuff tear or rupture of left shoulder, not specified as traumatic: Secondary | ICD-10-CM | POA: Diagnosis not present

## 2016-05-07 DIAGNOSIS — M75112 Incomplete rotator cuff tear or rupture of left shoulder, not specified as traumatic: Secondary | ICD-10-CM | POA: Diagnosis not present

## 2016-05-07 DIAGNOSIS — M25512 Pain in left shoulder: Secondary | ICD-10-CM | POA: Diagnosis not present

## 2016-05-07 DIAGNOSIS — Z4789 Encounter for other orthopedic aftercare: Secondary | ICD-10-CM | POA: Diagnosis not present

## 2016-05-11 DIAGNOSIS — M75112 Incomplete rotator cuff tear or rupture of left shoulder, not specified as traumatic: Secondary | ICD-10-CM | POA: Diagnosis not present

## 2016-05-11 DIAGNOSIS — M25512 Pain in left shoulder: Secondary | ICD-10-CM | POA: Diagnosis not present

## 2016-05-11 DIAGNOSIS — Z4789 Encounter for other orthopedic aftercare: Secondary | ICD-10-CM | POA: Diagnosis not present

## 2016-05-14 DIAGNOSIS — M75112 Incomplete rotator cuff tear or rupture of left shoulder, not specified as traumatic: Secondary | ICD-10-CM | POA: Diagnosis not present

## 2016-05-14 DIAGNOSIS — M25512 Pain in left shoulder: Secondary | ICD-10-CM | POA: Diagnosis not present

## 2016-05-14 DIAGNOSIS — Z4789 Encounter for other orthopedic aftercare: Secondary | ICD-10-CM | POA: Diagnosis not present

## 2016-05-15 NOTE — Progress Notes (Signed)
05/15/16 patient referred to [facility] for BIPAP set up.

## 2016-05-18 ENCOUNTER — Ambulatory Visit (INDEPENDENT_AMBULATORY_CARE_PROVIDER_SITE_OTHER): Payer: PPO

## 2016-05-18 ENCOUNTER — Telehealth: Payer: Self-pay

## 2016-05-18 DIAGNOSIS — M25512 Pain in left shoulder: Secondary | ICD-10-CM | POA: Diagnosis not present

## 2016-05-18 DIAGNOSIS — I5022 Chronic systolic (congestive) heart failure: Secondary | ICD-10-CM | POA: Diagnosis not present

## 2016-05-18 DIAGNOSIS — Z95810 Presence of automatic (implantable) cardiac defibrillator: Secondary | ICD-10-CM | POA: Diagnosis not present

## 2016-05-18 DIAGNOSIS — M75112 Incomplete rotator cuff tear or rupture of left shoulder, not specified as traumatic: Secondary | ICD-10-CM | POA: Diagnosis not present

## 2016-05-18 DIAGNOSIS — Z4789 Encounter for other orthopedic aftercare: Secondary | ICD-10-CM | POA: Diagnosis not present

## 2016-05-18 NOTE — Progress Notes (Signed)
EPIC Encounter for ICM Monitoring  Patient Name: Kevin Hart is a 71 y.o. male Date: 05/18/2016 Primary Care Physican: Guadlupe Spanish, MD Primary [place] Electrophysiologist: Allred Dry Weight:unknown                                                     Attempted call to patient ad unable to reach.  Left detailed message regarding transmission.  Transmission reviewed.    Thoracic impedance normal but was abnormal suggesting fluid accumulation x 12 days 04/24/2016 to 05/06/2016.  Prescribed dosage: Furosemide 40 mg 1 tablet daily  Labs: 03/05/2016 Creatinine 0.98, BUN 15, Potassium 3.7, Sodium 140, EGFR >60 11/28/2015 Creatinine 1.39, BUN 29, Potassium 3.8, Sodium 136, EGFR 49-57  10/17/2015 Creatinine 1.09, BUN 15, Potassium 4.1, Sodium 140   Recommendations: Left voice mail with ICM number and encouraged to call for fluid symptoms.  Follow-up plan: ICM clinic phone appointment on 06/18/2016.  Copy of ICM check sent to device physician.   3 month ICM trend: 05/18/2016   1 Year ICM trend:      Rosalene Billings, RN 05/18/2016 10:13 AM

## 2016-05-18 NOTE — Telephone Encounter (Signed)
Remote ICM transmission received.  Attempted patient call and left detailed message regarding transmission and next ICM scheduled for 06/18/2016.  Advised to return call for any fluid symptoms or questions.

## 2016-05-21 DIAGNOSIS — M25512 Pain in left shoulder: Secondary | ICD-10-CM | POA: Diagnosis not present

## 2016-05-21 DIAGNOSIS — M75112 Incomplete rotator cuff tear or rupture of left shoulder, not specified as traumatic: Secondary | ICD-10-CM | POA: Diagnosis not present

## 2016-05-21 DIAGNOSIS — Z4789 Encounter for other orthopedic aftercare: Secondary | ICD-10-CM | POA: Diagnosis not present

## 2016-05-25 ENCOUNTER — Ambulatory Visit: Payer: PPO | Admitting: Cardiovascular Disease

## 2016-05-26 DIAGNOSIS — M25512 Pain in left shoulder: Secondary | ICD-10-CM | POA: Diagnosis not present

## 2016-05-26 DIAGNOSIS — M75112 Incomplete rotator cuff tear or rupture of left shoulder, not specified as traumatic: Secondary | ICD-10-CM | POA: Diagnosis not present

## 2016-05-26 DIAGNOSIS — Z4789 Encounter for other orthopedic aftercare: Secondary | ICD-10-CM | POA: Diagnosis not present

## 2016-05-27 DIAGNOSIS — L570 Actinic keratosis: Secondary | ICD-10-CM | POA: Diagnosis not present

## 2016-05-27 DIAGNOSIS — C4441 Basal cell carcinoma of skin of scalp and neck: Secondary | ICD-10-CM | POA: Diagnosis not present

## 2016-05-27 DIAGNOSIS — I82409 Acute embolism and thrombosis of unspecified deep veins of unspecified lower extremity: Secondary | ICD-10-CM | POA: Diagnosis not present

## 2016-05-27 DIAGNOSIS — D689 Coagulation defect, unspecified: Secondary | ICD-10-CM | POA: Diagnosis not present

## 2016-05-27 DIAGNOSIS — Z85828 Personal history of other malignant neoplasm of skin: Secondary | ICD-10-CM | POA: Diagnosis not present

## 2016-05-27 DIAGNOSIS — Z9889 Other specified postprocedural states: Secondary | ICD-10-CM | POA: Diagnosis not present

## 2016-05-27 DIAGNOSIS — R040 Epistaxis: Secondary | ICD-10-CM | POA: Diagnosis not present

## 2016-05-27 DIAGNOSIS — Z08 Encounter for follow-up examination after completed treatment for malignant neoplasm: Secondary | ICD-10-CM | POA: Diagnosis not present

## 2016-06-02 DIAGNOSIS — Z4789 Encounter for other orthopedic aftercare: Secondary | ICD-10-CM | POA: Diagnosis not present

## 2016-06-02 DIAGNOSIS — M25512 Pain in left shoulder: Secondary | ICD-10-CM | POA: Diagnosis not present

## 2016-06-02 DIAGNOSIS — M75112 Incomplete rotator cuff tear or rupture of left shoulder, not specified as traumatic: Secondary | ICD-10-CM | POA: Diagnosis not present

## 2016-06-04 DIAGNOSIS — M25512 Pain in left shoulder: Secondary | ICD-10-CM | POA: Diagnosis not present

## 2016-06-04 DIAGNOSIS — Z4789 Encounter for other orthopedic aftercare: Secondary | ICD-10-CM | POA: Diagnosis not present

## 2016-06-04 DIAGNOSIS — M75112 Incomplete rotator cuff tear or rupture of left shoulder, not specified as traumatic: Secondary | ICD-10-CM | POA: Diagnosis not present

## 2016-06-05 DIAGNOSIS — E7800 Pure hypercholesterolemia, unspecified: Secondary | ICD-10-CM | POA: Diagnosis not present

## 2016-06-05 DIAGNOSIS — Z7901 Long term (current) use of anticoagulants: Secondary | ICD-10-CM | POA: Diagnosis not present

## 2016-06-05 DIAGNOSIS — I10 Essential (primary) hypertension: Secondary | ICD-10-CM | POA: Diagnosis not present

## 2016-06-05 DIAGNOSIS — I82409 Acute embolism and thrombosis of unspecified deep veins of unspecified lower extremity: Secondary | ICD-10-CM | POA: Diagnosis not present

## 2016-06-08 ENCOUNTER — Telehealth: Payer: Self-pay | Admitting: Cardiovascular Disease

## 2016-06-08 DIAGNOSIS — M25512 Pain in left shoulder: Secondary | ICD-10-CM | POA: Diagnosis not present

## 2016-06-08 DIAGNOSIS — Z4789 Encounter for other orthopedic aftercare: Secondary | ICD-10-CM | POA: Diagnosis not present

## 2016-06-08 DIAGNOSIS — M75112 Incomplete rotator cuff tear or rupture of left shoulder, not specified as traumatic: Secondary | ICD-10-CM | POA: Diagnosis not present

## 2016-06-08 NOTE — Telephone Encounter (Signed)
Patient called states that he thought he was supposed to receive a sleep machine before appt, but he has not yet received it. Thanks.

## 2016-06-09 NOTE — Telephone Encounter (Signed)
I called and spoke with Lattie Haw @ [facility] and she informed me that his insurance is shows " still pending." the person that handles the pre certs is out of the office this afternoon. She will have her to follow up on this tomorrow. Lattie Haw will contact the patient to give him a update on his machine status.

## 2016-06-10 DIAGNOSIS — Z4789 Encounter for other orthopedic aftercare: Secondary | ICD-10-CM | POA: Diagnosis not present

## 2016-06-10 DIAGNOSIS — M25512 Pain in left shoulder: Secondary | ICD-10-CM | POA: Diagnosis not present

## 2016-06-10 DIAGNOSIS — M75112 Incomplete rotator cuff tear or rupture of left shoulder, not specified as traumatic: Secondary | ICD-10-CM | POA: Diagnosis not present

## 2016-06-11 ENCOUNTER — Ambulatory Visit: Payer: PPO | Admitting: Cardiovascular Disease

## 2016-06-15 DIAGNOSIS — M75112 Incomplete rotator cuff tear or rupture of left shoulder, not specified as traumatic: Secondary | ICD-10-CM | POA: Diagnosis not present

## 2016-06-15 DIAGNOSIS — Z4789 Encounter for other orthopedic aftercare: Secondary | ICD-10-CM | POA: Diagnosis not present

## 2016-06-15 DIAGNOSIS — M25512 Pain in left shoulder: Secondary | ICD-10-CM | POA: Diagnosis not present

## 2016-06-18 ENCOUNTER — Telehealth: Payer: Self-pay

## 2016-06-18 ENCOUNTER — Ambulatory Visit (INDEPENDENT_AMBULATORY_CARE_PROVIDER_SITE_OTHER): Payer: PPO

## 2016-06-18 DIAGNOSIS — M25512 Pain in left shoulder: Secondary | ICD-10-CM | POA: Diagnosis not present

## 2016-06-18 DIAGNOSIS — Z95810 Presence of automatic (implantable) cardiac defibrillator: Secondary | ICD-10-CM

## 2016-06-18 DIAGNOSIS — Z4789 Encounter for other orthopedic aftercare: Secondary | ICD-10-CM | POA: Diagnosis not present

## 2016-06-18 DIAGNOSIS — M75112 Incomplete rotator cuff tear or rupture of left shoulder, not specified as traumatic: Secondary | ICD-10-CM | POA: Diagnosis not present

## 2016-06-18 DIAGNOSIS — I5022 Chronic systolic (congestive) heart failure: Secondary | ICD-10-CM

## 2016-06-18 NOTE — Progress Notes (Signed)
EPIC Encounter for ICM Monitoring  Patient Name: PAX REASONER is a 71 y.o. male Date: 06/18/2016 Primary Care Physican: Guadlupe Spanish, MD Primary [place] Electrophysiologist: Allred Dry Weight:unknown       Attempted call to patient and unable to reach.  Left detailed message regarding transmission.  Transmission reviewed.    Thoracic impedance normal.  Prescribed dosage: Furosemide 40 mg 1 tablet daily  Labs: 03/05/2016 Creatinine 0.98, BUN 15, Potassium 3.7, Sodium 140, EGFR >60 11/28/2015 Creatinine 1.39, BUN 29, Potassium 3.8, Sodium 136, EGFR 49-57  10/17/2015 Creatinine 1.09, BUN 15, Potassium 4.1, Sodium 140   Recommendations: NONE - Unable to reach patient   Follow-up plan: ICM clinic phone appointment on 07/29/2016.  Copy of ICM check sent to device physician.   3 month ICM trend: 06/18/2016   1 Year ICM trend:      Rosalene Billings, RN 06/18/2016 10:22 AM

## 2016-06-18 NOTE — Telephone Encounter (Signed)
Remote ICM transmission received.  Attempted patient call and left detailed message regarding transmission and next ICM scheduled for 07/29/2016.  Advised to return call for any fluid symptoms or questions.

## 2016-06-22 DIAGNOSIS — I10 Essential (primary) hypertension: Secondary | ICD-10-CM | POA: Diagnosis not present

## 2016-06-22 DIAGNOSIS — Z4789 Encounter for other orthopedic aftercare: Secondary | ICD-10-CM | POA: Diagnosis not present

## 2016-06-22 DIAGNOSIS — Z7901 Long term (current) use of anticoagulants: Secondary | ICD-10-CM | POA: Diagnosis not present

## 2016-06-22 DIAGNOSIS — M75112 Incomplete rotator cuff tear or rupture of left shoulder, not specified as traumatic: Secondary | ICD-10-CM | POA: Diagnosis not present

## 2016-06-22 DIAGNOSIS — I82409 Acute embolism and thrombosis of unspecified deep veins of unspecified lower extremity: Secondary | ICD-10-CM | POA: Diagnosis not present

## 2016-06-22 DIAGNOSIS — M25512 Pain in left shoulder: Secondary | ICD-10-CM | POA: Diagnosis not present

## 2016-06-24 DIAGNOSIS — M75112 Incomplete rotator cuff tear or rupture of left shoulder, not specified as traumatic: Secondary | ICD-10-CM | POA: Diagnosis not present

## 2016-06-24 DIAGNOSIS — Z4789 Encounter for other orthopedic aftercare: Secondary | ICD-10-CM | POA: Diagnosis not present

## 2016-06-24 DIAGNOSIS — M25512 Pain in left shoulder: Secondary | ICD-10-CM | POA: Diagnosis not present

## 2016-06-30 DIAGNOSIS — M75112 Incomplete rotator cuff tear or rupture of left shoulder, not specified as traumatic: Secondary | ICD-10-CM | POA: Diagnosis not present

## 2016-06-30 DIAGNOSIS — M25512 Pain in left shoulder: Secondary | ICD-10-CM | POA: Diagnosis not present

## 2016-06-30 DIAGNOSIS — Z4789 Encounter for other orthopedic aftercare: Secondary | ICD-10-CM | POA: Diagnosis not present

## 2016-06-30 DIAGNOSIS — G4733 Obstructive sleep apnea (adult) (pediatric): Secondary | ICD-10-CM | POA: Diagnosis not present

## 2016-07-06 DIAGNOSIS — I82409 Acute embolism and thrombosis of unspecified deep veins of unspecified lower extremity: Secondary | ICD-10-CM | POA: Diagnosis not present

## 2016-07-06 DIAGNOSIS — Z7901 Long term (current) use of anticoagulants: Secondary | ICD-10-CM | POA: Diagnosis not present

## 2016-07-06 DIAGNOSIS — I509 Heart failure, unspecified: Secondary | ICD-10-CM | POA: Diagnosis not present

## 2016-07-06 DIAGNOSIS — I2510 Atherosclerotic heart disease of native coronary artery without angina pectoris: Secondary | ICD-10-CM | POA: Diagnosis not present

## 2016-07-07 DIAGNOSIS — M75112 Incomplete rotator cuff tear or rupture of left shoulder, not specified as traumatic: Secondary | ICD-10-CM | POA: Diagnosis not present

## 2016-07-07 DIAGNOSIS — Z4789 Encounter for other orthopedic aftercare: Secondary | ICD-10-CM | POA: Diagnosis not present

## 2016-07-07 DIAGNOSIS — M25512 Pain in left shoulder: Secondary | ICD-10-CM | POA: Diagnosis not present

## 2016-07-08 ENCOUNTER — Other Ambulatory Visit: Payer: Self-pay | Admitting: Cardiology

## 2016-07-21 ENCOUNTER — Telehealth: Payer: Self-pay | Admitting: Cardiovascular Disease

## 2016-07-21 NOTE — Telephone Encounter (Signed)
New message      Pt request to talk to the sleep nurse

## 2016-07-23 NOTE — Telephone Encounter (Signed)
Follow up    Pt is calling to rs his appt with Dr. Claiborne Billings for his sleep. His appt is in July and he wants to make sure that's ok. He said he's supposed to see someone 1 month after getting his machine.

## 2016-07-27 NOTE — Telephone Encounter (Signed)
Returned a call to patient. He questioned if he should keep his July sleep appointment. Informed patient typically the patient has to be seen between 31-90 days. Patient voiced understanding.

## 2016-07-29 ENCOUNTER — Ambulatory Visit (INDEPENDENT_AMBULATORY_CARE_PROVIDER_SITE_OTHER): Payer: PPO | Admitting: *Deleted

## 2016-07-29 DIAGNOSIS — Z95810 Presence of automatic (implantable) cardiac defibrillator: Secondary | ICD-10-CM | POA: Diagnosis not present

## 2016-07-29 DIAGNOSIS — I5022 Chronic systolic (congestive) heart failure: Secondary | ICD-10-CM

## 2016-07-29 DIAGNOSIS — I519 Heart disease, unspecified: Secondary | ICD-10-CM | POA: Diagnosis not present

## 2016-07-29 DIAGNOSIS — I255 Ischemic cardiomyopathy: Secondary | ICD-10-CM | POA: Diagnosis not present

## 2016-07-29 NOTE — Progress Notes (Signed)
Remote ICD transmission.   

## 2016-07-30 ENCOUNTER — Telehealth: Payer: Self-pay

## 2016-07-30 DIAGNOSIS — G4733 Obstructive sleep apnea (adult) (pediatric): Secondary | ICD-10-CM | POA: Diagnosis not present

## 2016-07-30 NOTE — Progress Notes (Signed)
EPIC Encounter for ICM Monitoring  Patient Name: Kevin Hart is a 71 y.o. male Date: 07/30/2016 Primary Care Physican: Guadlupe Spanish, MD Primary [place] Electrophysiologist: Allred Dry Weight:unknown      Attempted call to patient and unable to reach.  Left detailed message regarding transmission.  Transmission reviewed.    Thoracic impedance normal.  Prescribed dosage: Furosemide 40 mg 1 tablet daily  Labs: 12/14/2017Creatinine 0.98, BUN 15, Potassium 3.7, Sodium 140, EGFR >60 11/28/2015 Creatinine 1.39, BUN 29, Potassium 3.8, Sodium 136, EGFR 49-57  10/17/2015 Creatinine 1.09, BUN 15, Potassium 4.1, Sodium 140   Recommendations:  Left voice mail with ICM number and encouraged to call for fluid symptoms.  Follow-up plan: ICM clinic phone appointment on 09/01/2016.    Copy of ICM check sent to device physician.   3 month ICM trend: 07/30/2016   1 Year ICM trend:      Rosalene Billings, RN 07/30/2016 3:08 PM

## 2016-07-30 NOTE — Telephone Encounter (Signed)
Remote ICM transmission received.  Attempted patient call and left detailed message regarding transmission and next ICM scheduled for 09/01/2016.  Advised to return call for any fluid symptoms or questions.

## 2016-07-31 ENCOUNTER — Encounter: Payer: Self-pay | Admitting: Cardiology

## 2016-07-31 LAB — CUP PACEART REMOTE DEVICE CHECK
Battery Voltage: 3.04 V
Brady Statistic RV Percent Paced: 1 %
HighPow Impedance: 75 Ohm
HighPow Impedance: 75 Ohm
Implantable Lead Implant Date: 20160906
Implantable Lead Location: 753860
Lead Channel Pacing Threshold Amplitude: 0.75 V
Lead Channel Pacing Threshold Pulse Width: 0.5 ms
Lead Channel Sensing Intrinsic Amplitude: 11.7 mV
Lead Channel Setting Sensing Sensitivity: 0.5 mV
MDC IDC MSMT BATTERY REMAINING LONGEVITY: 89 mo
MDC IDC MSMT BATTERY REMAINING PERCENTAGE: 83 %
MDC IDC MSMT LEADCHNL RV IMPEDANCE VALUE: 450 Ohm
MDC IDC PG IMPLANT DT: 20160906
MDC IDC SESS DTM: 20180509060024
MDC IDC SET LEADCHNL RV PACING AMPLITUDE: 2.5 V
MDC IDC SET LEADCHNL RV PACING PULSEWIDTH: 0.5 ms
Pulse Gen Serial Number: 7257239

## 2016-08-05 ENCOUNTER — Other Ambulatory Visit: Payer: Self-pay | Admitting: Pharmacy Technician

## 2016-08-05 NOTE — Patient Outreach (Signed)
[place] [facility]) Care Management  08/05/2016  Kevin Hart 03-Feb-1946 [id number]   Contacted patient in reference to medication adherence for [employer]. Patient states he is taking Lisinopril and Atorvastatin daily as prescribed and rarely misses any doses. We discussed 3 month supplies for cost, convenience, and to help with adherence. Patient states that he will discuss his prescription's being converted to 61 day's with his physician at his next visit.  Doreene Burke, [place] [phone]

## 2016-08-08 ENCOUNTER — Other Ambulatory Visit: Payer: Self-pay | Admitting: Cardiology

## 2016-08-10 NOTE — Telephone Encounter (Signed)
Rx(s) sent to pharmacy electronically.  

## 2016-08-11 DIAGNOSIS — E291 Testicular hypofunction: Secondary | ICD-10-CM | POA: Diagnosis not present

## 2016-08-11 DIAGNOSIS — I82409 Acute embolism and thrombosis of unspecified deep veins of unspecified lower extremity: Secondary | ICD-10-CM | POA: Diagnosis not present

## 2016-08-11 DIAGNOSIS — Z7901 Long term (current) use of anticoagulants: Secondary | ICD-10-CM | POA: Diagnosis not present

## 2016-08-11 DIAGNOSIS — I10 Essential (primary) hypertension: Secondary | ICD-10-CM | POA: Diagnosis not present

## 2016-08-11 DIAGNOSIS — E559 Vitamin D deficiency, unspecified: Secondary | ICD-10-CM | POA: Diagnosis not present

## 2016-08-11 DIAGNOSIS — D539 Nutritional anemia, unspecified: Secondary | ICD-10-CM | POA: Diagnosis not present

## 2016-08-11 DIAGNOSIS — R300 Dysuria: Secondary | ICD-10-CM | POA: Diagnosis not present

## 2016-08-11 DIAGNOSIS — R5383 Other fatigue: Secondary | ICD-10-CM | POA: Diagnosis not present

## 2016-08-11 DIAGNOSIS — E7800 Pure hypercholesterolemia, unspecified: Secondary | ICD-10-CM | POA: Diagnosis not present

## 2016-08-14 ENCOUNTER — Encounter: Payer: Self-pay | Admitting: Cardiology

## 2016-08-24 ENCOUNTER — Other Ambulatory Visit: Payer: Self-pay | Admitting: Cardiology

## 2016-08-24 NOTE — Telephone Encounter (Signed)
Rx request sent to pharmacy.  

## 2016-08-26 DIAGNOSIS — M25512 Pain in left shoulder: Secondary | ICD-10-CM | POA: Diagnosis not present

## 2016-08-30 DIAGNOSIS — G4733 Obstructive sleep apnea (adult) (pediatric): Secondary | ICD-10-CM | POA: Diagnosis not present

## 2016-09-01 ENCOUNTER — Telehealth: Payer: Self-pay | Admitting: Cardiology

## 2016-09-01 NOTE — Telephone Encounter (Signed)
Spoke with pt and reminded pt of remote transmission that is due today. Pt verbalized understanding and [place]. He will call ICM nurse when he gets home to reschedule.

## 2016-09-04 NOTE — Progress Notes (Signed)
No ICM remote transmission received for 09/01/2016 and next ICM transmission scheduled for 09/17/2016.

## 2016-09-12 ENCOUNTER — Other Ambulatory Visit: Payer: Self-pay | Admitting: Cardiology

## 2016-09-16 DIAGNOSIS — Z7901 Long term (current) use of anticoagulants: Secondary | ICD-10-CM | POA: Diagnosis not present

## 2016-09-16 DIAGNOSIS — N400 Enlarged prostate without lower urinary tract symptoms: Secondary | ICD-10-CM | POA: Diagnosis not present

## 2016-09-16 DIAGNOSIS — I10 Essential (primary) hypertension: Secondary | ICD-10-CM | POA: Diagnosis not present

## 2016-09-16 DIAGNOSIS — K219 Gastro-esophageal reflux disease without esophagitis: Secondary | ICD-10-CM | POA: Diagnosis not present

## 2016-09-17 ENCOUNTER — Ambulatory Visit (INDEPENDENT_AMBULATORY_CARE_PROVIDER_SITE_OTHER): Payer: PPO

## 2016-09-17 DIAGNOSIS — Z95810 Presence of automatic (implantable) cardiac defibrillator: Secondary | ICD-10-CM | POA: Diagnosis not present

## 2016-09-17 DIAGNOSIS — I5022 Chronic systolic (congestive) heart failure: Secondary | ICD-10-CM | POA: Diagnosis not present

## 2016-09-17 NOTE — Progress Notes (Signed)
EPIC Encounter for ICM Monitoring  Patient Name: Kevin Hart is a 71 y.o. male Date: 09/17/2016 Primary Care Physican: Guadlupe Spanish, MD Primary Care Physican: Guadlupe Spanish, MD Primary [place] Electrophysiologist: Allred Dry Weight:unknown      Heart Failure questions reviewed, pt asymptomatic    Thoracic impedance abnormal suggesting fluid accumulation since 09/10/2016.  He was on vacation for last 2 weeks and did not follow low salt diet.   Prescribed dosage: Furosemide 40 mg 1 tablet daily.   Labs: 12/14/2017Creatinine 0.98, BUN 15, Potassium 3.7, Sodium 140, EGFR >60 11/28/2015 Creatinine 1.39, BUN 29, Potassium 3.8, Sodium 136, EGFR 49-57  10/17/2015 Creatinine 1.09, BUN 15, Potassium 4.1, Sodium 140   Recommendations: Patient said he has gotten rid of the fluid and felt fine today.   Advised to limit salt intake to 2000 mg/day and fluid intake to < 2 liters/day.  Encouraged to call for fluid symptoms.  Follow-up plan: ICM clinic phone appointment on 10/01/2016 to recheck fluid levels.  He will be out of town next week for 7 days.  Office appointment scheduled 10/02/2016 with Dr. Claiborne Billings and Dr Martinique 10/07/2016.  Copy of ICM check sent to primary cardiologist and device physician.   3 month ICM trend: 09/17/2016   1 Year ICM trend:      Rosalene Billings, RN 09/17/2016 10:13 AM

## 2016-10-01 ENCOUNTER — Telehealth: Payer: Self-pay

## 2016-10-01 ENCOUNTER — Ambulatory Visit (INDEPENDENT_AMBULATORY_CARE_PROVIDER_SITE_OTHER): Payer: PPO

## 2016-10-01 DIAGNOSIS — Z95810 Presence of automatic (implantable) cardiac defibrillator: Secondary | ICD-10-CM

## 2016-10-01 DIAGNOSIS — I5022 Chronic systolic (congestive) heart failure: Secondary | ICD-10-CM

## 2016-10-01 NOTE — Telephone Encounter (Signed)
Remote ICM transmission received.  Attempted patient call and left detailed message regarding transmission and next ICM scheduled for 10/28/2016.  Advised to return call for any fluid symptoms or questions.

## 2016-10-01 NOTE — Progress Notes (Signed)
EPIC Encounter for ICM Monitoring  Patient Name: Kevin Hart is a 71 y.o. male Date: 10/01/2016 Primary Care Physican: Guadlupe Spanish, MD Primary [place] Electrophysiologist: Allred Dry Weight:unknown      Attempted call to patient and unable to reach.  Left detailed message regarding transmission.  Transmission reviewed.    Thoracic impedance abnormal suggesting fluid accumulation from 09/09/2016 to 09/20/2016 and starting again 09/28/2016 but trending toward baseline..  Prescribed dosage: Furosemide 40 mg 1 tablet daily.   Labs: 12/14/2017Creatinine 0.98, BUN 15, Potassium 3.7, Sodium 140, EGFR >60 11/28/2015 Creatinine 1.39, BUN 29, Potassium 3.8, Sodium 136, EGFR 49-57  10/17/2015 Creatinine 1.09, BUN 15, Potassium 4.1, Sodium 140   Recommendations: Left voice mail with ICM number and encouraged to call for fluid symptoms.  Follow-up plan: ICM clinic phone appointment on 10/28/2016.  Office appointment scheduled 10/02/2016 with Dr. Claiborne Billings (for sleep) and Dr Martinique 10/07/2016.  Copy of ICM check sent to primary cardiologist and device physician.   3 month ICM trend: 10/01/2016   1 Year ICM trend:      Rosalene Billings, RN 10/01/2016 12:03 PM

## 2016-10-02 ENCOUNTER — Ambulatory Visit (INDEPENDENT_AMBULATORY_CARE_PROVIDER_SITE_OTHER): Payer: PPO | Admitting: Cardiovascular Disease

## 2016-10-02 ENCOUNTER — Encounter: Payer: Self-pay | Admitting: Cardiovascular Disease

## 2016-10-02 VITALS — BP 118/82 | HR 76 | Ht 72.0 in | Wt 244.4 lb

## 2016-10-02 DIAGNOSIS — I519 Heart disease, unspecified: Secondary | ICD-10-CM | POA: Diagnosis not present

## 2016-10-02 DIAGNOSIS — I255 Ischemic cardiomyopathy: Secondary | ICD-10-CM | POA: Diagnosis not present

## 2016-10-02 DIAGNOSIS — G4733 Obstructive sleep apnea (adult) (pediatric): Secondary | ICD-10-CM | POA: Diagnosis not present

## 2016-10-02 DIAGNOSIS — G4731 Primary central sleep apnea: Secondary | ICD-10-CM | POA: Diagnosis not present

## 2016-10-02 NOTE — Patient Instructions (Signed)
Your physician wants you to follow-up in: 1 year or sooner if needed for sleep. You will receive a reminder letter in the mail two months in advance. If you don't receive a letter, please call our office to schedule the follow-up appointment.  
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
## 2016-10-04 NOTE — Progress Notes (Signed)
Cardiology Office Note    Date:  10/04/2016   ID:  Dejean, Tribby 02-15-1946, MRN [id number]  PCP:  Guadlupe Spanish, MD  Cardiologist:  Dr. Peter Martinique;  Shelva Majestic, MD (sleep)  F/U sleep evaluation  History of Present Illness:  AZARYAH OLEKSY is a 71 y.o. male who is followed by Dr. Martinique for his cardiac history and Dr. Rayann Heman for electrophysiology. I had seen him in January 2018 for initial sleep evaluation.  He presents for follow-up evaluation.   Mr. Macken and suffered an initial myocardial infarction in 2000 which time he underwent PCI to his LAD.  He has a history of multiple left lower leg DVTs for which he is been on Coumadin therapy in the past and most recently eloquence.  In April 2016.  He suffered a non-ST segment elevation MI and was found to have high-grade in-stent restenosis to his LAD and underwent DES stenting.  Due to continued LV dysfunction with an EF of a proximally 30-35%.  He ultimately underwent ICD implantation in September 2016 by Dr. Rayann Heman.  He has been followed by Dr.Arbin at [facility] for his primary care.  He complained of symptoms consisting of snoring, frequent awakenings, and has occasionally taken naps in the afternoon.  He ultimately was referred for a sleep study at [facility], which was done on 02/10/2016.  I have reviewed the sleep study in detail.  As was a split-night protocol.  He was found to have severe obstructive sleep apnea with an AHI of 57.3 per hour.  He had 111 desaturations and as well as oxygen saturation was 78%.  He met split-night criteria and initially underwent CPAP titration, but only 29 cm.  He was also noted to have occasional central apneic events and was started on BiPAP initially at 8 over 4 but due to central events was switched to BiPAP ST and was titrated up to maximum pressure of 15/12.  He continued to have events.  His AHI was still increased at 33.2 at this pressure.  The study ended it  was felt that he needed to undergo a complete BiPAP titration prior to recommendation of pressure.  Since the sleep lab was out of his insurance network, he presents for sleep evaluation with me and to have follow-up sleep study in [place] in his insurance network.  He goes to bed at approximately 10 PM and wakes up at 7:30 AM.  He snores loudly.  He has noticed frequent awakenings.  He has taken occasional naps in the afternoon.  His sleep is not completely restorative.  He is unaware of nocturnal palpitations.  There is no history of a defibrillator discharge.  He denies any recurrent anginal symptomatology.  He denies any recent symptoms of overt CHF.  An Epworth Sleepiness Scale score was calculated at hid January 2018 and this endorsed at 10 and shown below:  Epworth Sleepiness Scale: Situation   Chance of Dozing/Sleeping (0 = never , 1 = slight chance , 2 = moderate chance , 3 = high chance )   sitting and reading 1   watching TV 3   sitting inactive in a public place 1   being a passenger in a motor vehicle for an hour or more 0   lying down in the afternoon 3   sitting and talking to someone 0   sitting quietly after lunch (no alcohol) 2   while stopped for a few minutes in traffic as the  driver 0   Total Score  10   Since I saw him, he was referred for a BiPAP titration with possible ST mode due to previous documentation of central events on prior titration.  This was done on 04/20/2016.  Initial trial of BiPAP therapy at 15/11 was recommended but an auto mode unit with ST mode capability was recommended.  In the event he continue to have central events.  Apparently he received a [employer] 10 V auto unit.  Apparently he has been on a minium EPAP setting of 11, but his IPAP maximum which should be 25 for some reason was only set at 15 cm. I obtained a download in the office today from 09/01/2016 through 09/30/2016.  He is meeting compliance standards with 100% of usage stays and  97% of days greater than 4 hours.  He is averaging only 6 hours and 22 minutes per night.  His AHI continues to be elevated at his maximum IPAP setting pressure of 15 (this should have bee his minimum IPAP pressure).  His apnea index was 26.3 with a hypopnea index of 0.3.  Central apnea index was 6.0.  He is having moderate leak due to poor mask fit.[employer] is his DME.  A new upward scale was calculated in the office today and this also endorsed at 10.  He presents for evaluation.   Past Medical History:  Diagnosis Date  . Arthritis   . BPH (benign prostatic hyperplasia)    takes Flomax daily  . CAD (coronary artery disease)    a. prior LAD stenting;  b. 06/2014 NSTEMI/PCI: LM nl, LAD 30p, 44mISR (3.0x20 Promus DES), LCX nl, RCA nondom, nl, EF 25-30% ant, apical, dist inf AK.  . Cataracts, bilateral    immature  . Chronic systolic CHF ([place]estive heart failure) [place] 12/31/2014  . DVT (deep venous thrombosis) (HCC)    RECURRENT left leg  . Dyslipidemia   . GERD (gastroesophageal reflux disease)   . Gout   . History of kidney stones   . Hypertension   . Ischemic cardiomyopathy 07/06/2014  . Lupus anticoagulant positive   . S/P drug eluting coronary stent placement, 07/06/14, Promus 07/07/2014  . Sleep apnea   . [place] [place]   after falling and hitting his head    Past Surgical History:  Procedure Laterality Date  . COLONOSCOPY    . EP IMPLANTABLE DEVICE [name] 11/27/2014   [employer] Fortify Assura VR  implanted by Dr ARayann Hemanfor primary prevention  . LEFT HEART CATHETERIZATION WITH CORONARY ANGIOGRAM [name] 07/06/2014   Procedure: LEFT HEART CATHETERIZATION WITH CORONARY ANGIOGRAM;  Surgeon: Peter M JMartinique MD;  [facility];  Service: Cardiovascular;  Laterality: [name];  . PERCUTANEOUS CORONARY STENT INTERVENTION (PCI-S) Right 07/06/2014   Procedure: PERCUTANEOUS CORONARY STENT INTERVENTION (PCI-S);  Surgeon: Peter M JMartinique MD;  [facility];  Service:  Cardiovascular;  Laterality: Right;  . SHOULDER ARTHROSCOPY WITH SUBACROMIAL DECOMPRESSION Left 03/05/2016   Procedure: SHOULDER ARTHROSCOPY ROTATOR CUFF REPAIR WITH SUBACROMIAL DECOMPRESSION;  Surgeon: JTania Ade MD;  [place] [facility]  Service: Orthopedics;  Laterality: Left;  SHOULDER ARTHROSCOPY ROTATOR CUFF REPAIR WITH SUBACROMIAL DECOMPRESSION  . SHOULDER CLOSED REDUCTION Left 11/29/2015   Procedure: CLOSED REDUCTION SHOULDER;  Surgeon: JTania Ade MD;  [place] [facility]  Service: Orthopedics;  Laterality: Left;  . WRIST SURGERY Bilateral    plates and screws     Current Medications: Outpatient Medications Prior to Visit  Medication Sig  Dispense Refill  . allopurinol (ZYLOPRIM) 300 MG tablet Take 1 tablet by mouth daily.    Marland Kitchen atorvastatin (LIPITOR) 80 MG tablet Take 1 tablet (80 mg total) by mouth daily at 6 PM. 30 tablet 6  . carvedilol (COREG) 6.25 MG tablet TAKE ONE TABLET TWICE DAILY 180 tablet 2  . clopidogrel (PLAVIX) 75 MG tablet TAKE ONE (1) TABLET EACH DAY 30 tablet 6  . ELIQUIS 5 MG TABS tablet Take 5 mg by mouth 2 (two) times daily.     Marland Kitchen eplerenone (INSPRA) 25 MG tablet TAKE ONE (1) TABLET EACH DAY 30 tablet 6  . furosemide (LASIX) 40 MG tablet Take 40 mg by mouth daily.      Marland Kitchen lisinopril (PRINIVIL,ZESTRIL) 2.5 MG tablet TAKE ONE (1) TABLET EACH DAY 30 tablet 6  . Multiple Vitamin (MULTI-VITAMINS) TABS Take 1 tablet by mouth daily.     . nitroGLYCERIN (NITROSTAT) 0.4 MG SL tablet Place 1 tablet (0.4 mg total) under the tongue every 5 (five) minutes x 3 doses as needed for chest pain. 25 tablet 11  . pantoprazole (PROTONIX) 40 MG tablet Take 1 tablet (40 mg total) by mouth daily. 30 tablet 2  . Tamsulosin HCl (FLOMAX) 0.4 MG CAPS Take 0.4 mg by mouth daily.     Marland Kitchen docusate sodium (COLACE) 100 MG capsule Take 1 capsule (100 mg total) by mouth 3 (three) times daily as needed. 20 capsule 0  . Vitamin D, Ergocalciferol, (DRISDOL) 50000 UNITS CAPS capsule Take 50,000 Units  by mouth once a week. Saturdays    . Ascorbic Acid (VITAMIN C) 1000 MG tablet Take 1,000 mg by mouth daily.    Marland Kitchen oxyCODONE-acetaminophen (ROXICET) 5-325 MG tablet Take 1-2 tablets by mouth every 4 (four) hours as needed for severe pain. (Patient not taking: Reported on 10/02/2016) 60 tablet 0   No facility-administered medications prior to visit.      Allergies:   No known allergies   Social History   Social History  . Marital status: Married    Spouse name: [name]  . Number of children: 6  . Years of education: [name]   Social History Main Topics  . Smoking status: Never Smoker  . Smokeless tobacco: Never Used  . Alcohol use No  . Drug use: No  . Sexual activity: Yes    Birth control/ protection: None   Other Topics Concern  . None   Social History Narrative   Pt lives in [place] with spouse.  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    Additional social history is notable in that his first wife died at age 2.  His second marriage last 51 years and one month.  For the past 2 years.  He is now married to his third wife.  Family History:  The patient's family history includes Aneurysm in his father.   ROS General: Negative; No fevers, chills, or night sweats;  HEENT: Negative; No changes in vision or hearing, sinus congestion, difficulty swallowing Pulmonary: Negative; No cough, wheezing, shortness of breath, hemoptysis Cardiovascular: see HPI GI: Negative; No nausea, vomiting, diarrhea, or abdominal pain GU: Negative; No dysuria, hematuria, or difficulty voiding Musculoskeletal: Negative; no myalgias, joint pain, or weakness Hematologic/Oncology: Negative; no easy bruising, bleeding Endocrine: Negative; no heat/cold intolerance; no diabetes Neuro: Negative; no changes in balance, headaches Skin: Negative; No rashes or skin lesions Psychiatric: Negative; No behavioral problems, depression Sleep: severe sleep apnea on his recent sleep study; positive for snoring,  daytime sleepiness, hypersomnolence; no  bruxism, restless legs, hypnogognic hallucinations, no cataplexy Other comprehensive 14 point system review is negative.   PHYSICAL EXAM:   VS:  BP 118/82   Pulse 76   Ht 6' (1.829 m)   Wt 244 lb 6.4 oz (110.9 kg)   BMI 33.15 kg/m     Repeat blood pressure 112/74  Wt Readings from Last 3 Encounters:  10/02/16 244 lb 6.4 oz (110.9 kg)  04/30/16 237 lb (107.5 kg)  04/20/16 225 lb (102.1 kg)    General: Alert, oriented, no distress.  Skin: normal turgor, no rashes, warm and dry HEENT: Normocephalic, atraumatic. Pupils equal round and reactive to light; sclera anicteric; arcus senilis bilaterally.  extraocular muscles intact; Fundi no hemorrhages or exudates Nose without nasal septal hypertrophy Mouth/Parynx benign; Mallinpatti scale 3 Neck: No JVD, no carotid bruits; normal carotid upstroke Lungs: clear to ausculatation and percussion; no wheezing or rales Chest wall: without tenderness to palpitation Heart: PMI not displaced, RRR, s1 s2 normal, 1/6 systolic murmur, no diastolic murmur, no rubs, gallops, thrills, or heaves Abdomen: soft, nontender; no hepatosplenomehaly, BS+; abdominal aorta nontender and not dilated by palpation. Back: no CVA tenderness Pulses 2+ Musculoskeletal: full range of motion, normal strength, no joint deformities Extremities: no clubbing cyanosis or edema, Homan's sign negative  Neurologic: grossly nonfocal; Cranial nerves grossly wnl Psychologic: Normal mood and affect    Studies/Labs Reviewed:   EKG:  EKG is ordered today.    The prior ECG  independently reviewed by me shows normal sinus rhythm at 87 bpm.  There was mild first-degree AV block with a PR interval of 22 ms.  Q wave is present in 3 and aVF.  QRS complex V1 and V2.  Nonspecific ST changes.  Recent Labs: BMP Latest Ref Rng & Units 03/05/2016 11/28/2015 10/17/2015  Glucose 65 - 99 mg/dL 119(H) 174(H) 120(H)  BUN 6 - 20 mg/dL 15 29(H) 15    Creatinine 0.61 - 1.24 mg/dL 0.98 1.39(H) 1.09  Sodium 135 - 145 mmol/L 140 136 140  Potassium 3.5 - 5.1 mmol/L 3.7 3.8 4.1  Chloride 101 - 111 mmol/L 109 103 105  CO2 22 - 32 mmol/L _0 Calcium 8.9 - 10.3 mg/dL 9.5 9.6 9.9     Hepatic Function Latest Ref Rng & Units 07/25/2014  Total Protein 6.0 - 8.3 g/dL 8.0  Albumin 3.5 - 5.2 g/dL 4.3  AST 0 - 37 U/L 24  ALT 0 - 53 U/L 25  Alk Phosphatase 39 - 117 U/L 48  Total Bilirubin 0.2 - 1.2 mg/dL 0.8  Bilirubin, Direct 0.0 - 0.3 mg/dL 0.2    CBC Latest Ref Rng & Units 03/05/2016 11/28/2015 03/16/2015  WBC 4.0 - 10.5 K/uL 7.2 11.3(H) 6.8  Hemoglobin 13.0 - 17.0 g/dL 14.2 14.9 13.9  Hematocrit 39.0 - 52.0 % 41.5 42.4 40.6  Platelets 150 - 400 K/uL 186 206 191   Lab Results  Component Value Date   MCV 87.9 03/05/2016   MCV 88.7 11/28/2015   MCV 89.2 03/16/2015   Lab Results  Component Value Date   TSH 1.25 04/09/2009   No results found for: HGBA1C   BNP    Component Value Date/Time   BNP 166.0 (H) 03/16/2015 0010    ProBNP No results found for: PROBNP   Lipid Panel     Component Value Date/Time   CHOL 113 07/25/2014 1029   TRIG 122.0 07/25/2014 1029   HDL 31.90 (L) 07/25/2014 1029   CHOLHDL 4 07/25/2014 1029  VLDL 24.4 07/25/2014 1029   LDLCALC 57 07/25/2014 1029     RADIOLOGY: No results found.   Additional studies/ records that were reviewed today include:  [facility] Sleep report; cardiology records    ASSESSMENT:    1. Complex sleep apnea syndrome   2. Chronic systolic dysfunction of left ventricle   3. OSA treated with BiPAP   4. Cardiomyopathy, ischemic      PLAN:  Mr. Roshaun Pound is a 71 year old Caucasian male who has significant cardiovascular comorbidities and is status post 2 prior MIs and stenting of his LAD.  He has an ischemic cardiomyopathy and has had issues with chronic heart failure, probably combined.  He status post ICD implantation for persistent reduced LV  function.  He was recently found to have severe obstructive sleep apnea with an AHI of 57.3 per hour, and was titrated initially with CPAP and ultimately BiPAP therapy.  He was noted to have central events and was transitioned to BiPAP ST mode.  On his titration portion of his split-night studyhe was titrated up to 15/12 but continued to have an elevated AHI and titration was suboptimal.  I reviewed his most recent [facility].  Of I had recommended possible BiPAP auto unit with ST mode capability, he has an [employer] V10 auto unit.  I am changing his settings such that his EPAP minimum will be 10 and I'm increasing pressure support to 5 sets that his lowest pressure should be 15/10.  His maximum IPAP pressure will be 25, which will allow him to effectively treat his continued apneic incidences.  I am adding a 15 minute ramp time.   He has been wearing his mask very tight and as result, has had significant leak which also is contributing to his elevated AHI.  We spent time with him today demonstrating proper mask alignment and pressure and discussed significant maintenance issues to   reduce mass leak.  A new download will be obtained.  Adjustments will be made as needed.  He is meeting compliance standards.  Per Medicare guidelines I will see him face-to-face in one year or sooner if problems arise.    Medication Adjustments/Labs and Tests Ordered: Current medicines are reviewed at length with the patient today.  Concerns regarding medicines are outlined above.  Medication changes, Labs and Tests ordered today are listed in the Patient Instructions below.  Patient Instructions  Your physician wants you to follow-up in: 1 year or sooner if needed for sleep. You will receive a reminder letter in the mail two months in advance. If you don't receive a letter, please call our office to schedule the follow-up appointment.    Time spent: 40  minutes Signed, Shelva Majestic, MD, [facility] 10/04/2016 6:23 PM    [place] [address] [place], [place], [place]  [address] Phone: [phone]

## 2016-10-07 ENCOUNTER — Ambulatory Visit: Payer: PPO | Admitting: Cardiology

## 2016-10-08 ENCOUNTER — Encounter: Payer: Self-pay | Admitting: Physician Assistant

## 2016-10-08 ENCOUNTER — Ambulatory Visit (INDEPENDENT_AMBULATORY_CARE_PROVIDER_SITE_OTHER): Payer: PPO | Admitting: Physician Assistant

## 2016-10-08 VITALS — BP 128/70 | HR 79 | Ht 73.0 in | Wt 241.0 lb

## 2016-10-08 DIAGNOSIS — I255 Ischemic cardiomyopathy: Secondary | ICD-10-CM

## 2016-10-08 DIAGNOSIS — Z95810 Presence of automatic (implantable) cardiac defibrillator: Secondary | ICD-10-CM | POA: Diagnosis not present

## 2016-10-08 DIAGNOSIS — I25118 Atherosclerotic heart disease of native coronary artery with other forms of angina pectoris: Secondary | ICD-10-CM

## 2016-10-08 DIAGNOSIS — I10 Essential (primary) hypertension: Secondary | ICD-10-CM

## 2016-10-08 DIAGNOSIS — I825Y9 Chronic embolism and thrombosis of unspecified deep veins of unspecified proximal lower extremity: Secondary | ICD-10-CM

## 2016-10-08 DIAGNOSIS — G4733 Obstructive sleep apnea (adult) (pediatric): Secondary | ICD-10-CM | POA: Diagnosis not present

## 2016-10-08 DIAGNOSIS — R079 Chest pain, unspecified: Secondary | ICD-10-CM | POA: Diagnosis not present

## 2016-10-08 MED ORDER — WARFARIN SODIUM 5 MG PO TABS: 5.0000 mg | ORAL_TABLET | Freq: Every day | ORAL | 0 refills | Status: DC

## 2016-10-08 NOTE — Patient Instructions (Signed)
Medication Instructions:  Your physician recommends that you continue on your current medications as directed. Please refer to the Current Medication list given to you today.  Labwork: NONE   Testing/Procedures: Your physician has requested that you have a lexiscan myoview. For further information please visit [email or web address]. Please follow instruction sheet, as given.  Follow-Up: Your physician wants you to follow-up in: 6 MONTHS WITH DR Martinique ONLY. You will receive a reminder letter in the mail two months in advance. If you don't receive a letter, please call our office to schedule the follow-up appointment. IF TEST RESULTS ARE POSITIVE WE WILL HAVE YOU FOLLOW UP SOONER  Any Other Special Instructions Will Be Listed Below (If Applicable).  If you need a refill on your cardiac medications before your next appointment, please call your pharmacy.

## 2016-10-08 NOTE — Progress Notes (Signed)
Cardiology Office Note    Date:  10/08/2016   ID:  Rustin, Erhart 1945/06/17, MRN [id number]  PCP:  Guadlupe Spanish, MD  Cardiologist:  Dr. Peter Martinique;  Shelva Majestic, MD (sleep) Primary electrophysiologist: Dr. Rayann Heman  Chief Complaint  Patient presents with  . Follow-up    seen for Dr. Martinique    History of Present Illness:  Kevin Hart is a 71 y.o. 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 Claiborne Billings. He was admitted in April 2016 and underwent DES to his LAD for in-stent restenosis. Cardiac catheterization at that time showed 90% mid LAD stenosis within old stent, EF 25-30%. Titration of medications but limited by orthostasis. Repeat echocardiogram in May 2017 showed EF 30-35%. He underwent ICD implantation by Dr. Rayann Heman 11/27/2014. He was diagnosed with obstructive sleep apnea at [facility].  Patient presents today for cardiac office visit. He says in the past few weeks, he has been having chest tightness in the substernal area that last 15 seconds at a time. The location of the area is the same as when he had in-stent restenosis in April 2016. However he is not entirely sure if it is related to exertion as he has not done anything exertional recently. He says the symptom is getting more frequent and it now occurs 1-2 times per day. We discussed several options including medical therapy and observation with Imdur versus going straight to stress test. He wished to proceed with stress test as this time, I think this is reasonable. He says he could not tolerate eliquis, and it has since been switched to Coumadin.   Past Medical History:  Diagnosis Date  . Arthritis   . BPH (benign prostatic hyperplasia)    takes Flomax daily  . CAD (coronary artery disease)    a. prior LAD stenting;   b. 06/2014 NSTEMI/PCI: LM nl, LAD 30p, 25m ISR (3.0x20 Promus DES), LCX nl, RCA nondom, nl, EF 25-30% ant, apical, dist inf AK.  . Cataracts, bilateral    immature  . Chronic systolic CHF (congestive heart failure) ([place]) 12/31/2014  . DVT (deep venous thrombosis) ([facility])    RECURRENT left leg  . Dyslipidemia   . GERD (gastroesophageal reflux disease)   . Gout   . History of kidney stones   . Hypertension   . Ischemic cardiomyopathy 07/06/2014  . Lupus anticoagulant positive   . S/P drug eluting coronary stent placement, 07/06/14, Promus 07/07/2014  . Sleep apnea   . Subarachnoid bleed ([place]) 2015   after falling and hitting his head    Past Surgical History:  Procedure Laterality Date  . COLONOSCOPY    . EP IMPLANTABLE DEVICE [name] 11/27/2014   St. Jude Medical Fortify Assura VR  implanted by Dr Rayann Heman for primary prevention  . LEFT HEART CATHETERIZATION WITH CORONARY ANGIOGRAM [name] 07/06/2014   Procedure: LEFT HEART CATHETERIZATION WITH CORONARY ANGIOGRAM;  Surgeon: Peter M Martinique, MD;  Location[facility] CATH LAB;  Service: Cardiovascular;  Laterality: [name];  . PERCUTANEOUS CORONARY STENT INTERVENTION (PCI-S) Right 07/06/2014   Procedure: PERCUTANEOUS CORONARY STENT INTERVENTION (PCI-S);  Surgeon: Peter M Martinique, MD;  Location: [facility] CATH LAB;  Service: Cardiovascular;  Laterality: Right;  . SHOULDER ARTHROSCOPY WITH SUBACROMIAL DECOMPRESSION Left 03/05/2016   Procedure: SHOULDER ARTHROSCOPY ROTATOR CUFF REPAIR WITH SUBACROMIAL DECOMPRESSION;  Surgeon:  Tania Ade, MD;  Location: [place];  Service: Orthopedics;  Laterality: Left;  SHOULDER ARTHROSCOPY ROTATOR CUFF REPAIR WITH SUBACROMIAL DECOMPRESSION  . SHOULDER CLOSED REDUCTION Left 11/29/2015   Procedure: CLOSED REDUCTION SHOULDER;  Surgeon: Tania Ade, MD;  Location: [place];  Service: Orthopedics;  Laterality: Left;  . WRIST SURGERY Bilateral    plates and screws     Current Medications: Outpatient Medications Prior to Visit  Medication Sig  Dispense Refill  . allopurinol (ZYLOPRIM) 300 MG tablet Take 1 tablet by mouth daily.    . Ascorbic Acid (VITAMIN C) 1000 MG tablet Take 1,000 mg by mouth daily.    Marland Kitchen atorvastatin (LIPITOR) 80 MG tablet Take 1 tablet (80 mg total) by mouth daily at 6 PM. 30 tablet 6  . carvedilol (COREG) 6.25 MG tablet TAKE ONE TABLET TWICE DAILY 180 tablet 2  . clopidogrel (PLAVIX) 75 MG tablet TAKE ONE (1) TABLET EACH DAY 30 tablet 6  . eplerenone (INSPRA) 25 MG tablet TAKE ONE (1) TABLET EACH DAY 30 tablet 6  . furosemide (LASIX) 40 MG tablet Take 40 mg by mouth daily.      Marland Kitchen lisinopril (PRINIVIL,ZESTRIL) 2.5 MG tablet TAKE ONE (1) TABLET EACH DAY 30 tablet 6  . Multiple Vitamin (MULTI-VITAMINS) TABS Take 1 tablet by mouth daily.     . nitroGLYCERIN (NITROSTAT) 0.4 MG SL tablet Place 1 tablet (0.4 mg total) under the tongue every 5 (five) minutes x 3 doses as needed for chest pain. 25 tablet 11  . pantoprazole (PROTONIX) 40 MG tablet Take 1 tablet (40 mg total) by mouth daily. 30 tablet 2  . Tamsulosin HCl (FLOMAX) 0.4 MG CAPS Take 0.4 mg by mouth daily.     Marland Kitchen ELIQUIS 5 MG TABS tablet Take 5 mg by mouth 2 (two) times daily.      No facility-administered medications prior to visit.      Allergies:   No known allergies   Social History   Social History  . Marital status: Married    Spouse name: [name]  . Number of children: 6  . Years of education: [name]   Social History Main Topics  . Smoking status: Never Smoker  . Smokeless tobacco: Never Used  . Alcohol use No  . Drug use: No  . Sexual activity: Yes    Birth control/ protection: None   Other Topics Concern  . None   Social History Narrative   Pt lives in [place] with spouse.  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     Family History:  The patient's family history includes Aneurysm in his father.   ROS:   Please see the history of present illness.    ROS All other systems reviewed and are negative.   PHYSICAL EXAM:   VS:   BP 128/70   Pulse 79   Ht 6\' 1"  (1.854 m)   Wt 241 lb (109.3 kg)   BMI 31.80 kg/m    GEN: Well nourished, well developed, in no acute distress  HEENT: normal  Neck: no JVD, carotid bruits, or masses Cardiac: RRR; no murmurs, rubs, or gallops,no edema  Respiratory:  clear to auscultation bilaterally, normal work of breathing GI: soft, nontender, nondistended, + BS MS: no deformity or atrophy  Skin: warm and dry, no rash Neuro:  Alert and Oriented x 3, Strength and sensation are intact Psych: euthymic mood, full affect  Wt Readings from Last 3 Encounters:  10/08/16 241 lb (109.3 kg)  10/02/16 244 lb  6.4 oz (110.9 kg)  04/30/16 237 lb (107.5 kg)      Studies/Labs Reviewed:   EKG:  EKG is ordered today.  The ekg ordered today demonstrates Normal sinus rhythm, poor R wave progression in the anterior leads.  Recent Labs: 03/05/2016: BUN 15; Creatinine, Ser 0.98; Hemoglobin 14.2; Platelets 186; Potassium 3.7; Sodium 140   Lipid Panel    Component Value Date/Time   CHOL 113 07/25/2014 1029   TRIG 122.0 07/25/2014 1029   HDL 31.90 (L) 07/25/2014 1029   CHOLHDL 4 07/25/2014 1029   VLDL 24.4 07/25/2014 1029   LDLCALC 57 07/25/2014 1029    Additional studies/ records that were reviewed today include:   Cath 07/01/2001    Echo 07/26/2015 LV EF: 30% -   35%  Study Conclusions  - Left ventricle: There is akinesis of the mid anteroseptal,   anterior, apical septal, anterior, inferior walls and of the true   septum. The cavity size was normal. There was mild concentric   hypertrophy. Systolic function was moderately to severely   reduced. The estimated ejection fraction was in the range of 30%   to 35%. Wall motion was normal; there were no regional wall   motion abnormalities. Doppler parameters are consistent with   abnormal left ventricular relaxation (grade 1 diastolic   dysfunction). There was no evidence of elevated ventricular   filling pressure by Doppler  parameters. - Aortic valve: Trileaflet; normal thickness leaflets. There was   mild regurgitation. - Ascending aorta: The aortic root and ascending aorta were mildly   dilated measuring 40 mm. - Mitral valve: Calcified annulus. There was no regurgitation. - Left atrium: The atrium was mildly dilated. - Right ventricle: Pacer wire or catheter noted in right ventricle.   Systolic function was normal. - Right atrium: Pacer wire or catheter noted in right atrium. - Tricuspid valve: There was mild regurgitation. - Pulmonic valve: There was no regurgitation. - Pulmonary arteries: Systolic pressure was at the upper limits of   normal. PA peak pressure: 33 mm Hg (S). - Inferior vena cava: The vessel was normal in size.    ASSESSMENT:    1. Chest pain at rest   2. Coronary artery disease involving native coronary artery of native heart with other form of angina pectoris ([place])   3. Chronic deep vein thrombosis (DVT) of proximal vein of lower extremity, unspecified laterality ([place])   4. Ischemic cardiomyopathy   5. ICD (implantable cardioverter-defibrillator) in place   6. Essential hypertension   7. OSA (obstructive sleep apnea)      PLAN:  In order of problems listed above:  1. Chest pain: The location of chest pain is similar to his previous angina in 2016. However this is relatively short episode, the frequency of the episodes has been increasing which is concerning. We discussed various options, he opted for [facility].  2. CAD: 2 overlapping stent in LAD, last LAD stent was placed in 2016. Continue Plavix and Coumadin.  3. Ischemic cardiomyopathy s/p ICD: Followed by Dr. Rayann Heman, never had a discharge  4. Hypertension: Blood pressure stable.  5. Obstructive sleep apnea: Managed by Dr. Claiborne Billings  6. H/o recurrent DVT: Lupus anticoagulant positive, he has been switched to Coumadin. He is no longer on eliquis per patient, Coumadin dose has been managed by primary care physician.  He is also on Plavix for previous history of PCI.    Medication Adjustments/Labs and Tests Ordered: Current medicines are reviewed at length with the patient today.  Concerns regarding medicines are outlined above.  Medication changes, Labs and Tests ordered today are listed in the Patient Instructions below. Patient Instructions  Medication Instructions:  Your physician recommends that you continue on your current medications as directed. Please refer to the Current Medication list given to you today.  Labwork: NONE   Testing/Procedures: Your physician has requested that you have a lexiscan myoview. For further information please visit [email or web address]. Please follow instruction sheet, as given.  Follow-Up: Your physician wants you to follow-up in: 6 MONTHS WITH DR Martinique ONLY. You will receive a reminder letter in the mail two months in advance. If you don't receive a letter, please call our office to schedule the follow-up appointment. IF TEST RESULTS ARE POSITIVE WE WILL HAVE YOU FOLLOW UP SOONER  Any Other Special Instructions Will Be Listed Below (If Applicable).  If you need a refill on your cardiac medications before your next appointment, please call your pharmacy.     Hilbert Corrigan, [place]  10/08/2016 4:43 PM    [facility] [place], [place], [place]  [address] Phone: [phone]; Fax: [phone]

## 2016-10-09 ENCOUNTER — Telehealth (HOSPITAL_COMMUNITY): Payer: Self-pay

## 2016-10-09 NOTE — Telephone Encounter (Signed)
Encounter complete. 

## 2016-10-14 ENCOUNTER — Ambulatory Visit (HOSPITAL_COMMUNITY)
Admission: RE | Admit: 2016-10-14 | Discharge: 2016-10-14 | Disposition: A | Discharge status: Home / Self Care | Payer: PPO | Source: Ambulatory Visit | Attending: Cardiovascular Disease | Admitting: Cardiovascular Disease

## 2016-10-14 DIAGNOSIS — R079 Chest pain, unspecified: Secondary | ICD-10-CM | POA: Insufficient documentation

## 2016-10-14 LAB — MYOCARDIAL PERFUSION IMAGING
LVDIAVOL: 164 mL (ref 62–150)
LVSYSVOL: 1 mL
Peak HR: 84 {beats}/min
Rest HR: 61 {beats}/min
SDS: 4
SRS: 14
SSS: 18
TID: 1.01

## 2016-10-14 MED ORDER — REGADENOSON 0.4 MG/5ML IV SOLN
0.4000 mg | Freq: Once | INTRAVENOUS | Status: AC
  Administered 2016-10-14: 0.4 mg via INTRAVENOUS

## 2016-10-14 MED ORDER — TECHNETIUM TC 99M TETROFOSMIN IV KIT
31.2000 | PACK | Freq: Once | INTRAVENOUS | Status: AC | PRN
  Administered 2016-10-14: 31.2 via INTRAVENOUS
  Filled 2016-10-14: qty 32

## 2016-10-14 MED ORDER — TECHNETIUM TC 99M TETROFOSMIN IV KIT
9.5000 | PACK | Freq: Once | INTRAVENOUS | Status: AC | PRN
  Administered 2016-10-14: 9.5 via INTRAVENOUS
  Filled 2016-10-14: qty 10

## 2016-10-26 DIAGNOSIS — G4733 Obstructive sleep apnea (adult) (pediatric): Secondary | ICD-10-CM | POA: Diagnosis not present

## 2016-10-28 ENCOUNTER — Encounter: Payer: PPO | Admitting: *Deleted

## 2016-10-28 IMAGING — RF DG SHOULDER 1V*L*
1 series · 1 of 1 positions shown · non-contrast
Comparison: 11/28/2015

FLUOROSCOPY TIME:  0 minutes 2 seconds

Images obtained: 1

CLINICAL DATA: Close reduction LEFT shoulder

EXAM:
LEFT SHOULDER - 1 VIEW

[Series 1: run · 1 of 1 slices shown]
[im 1/1]
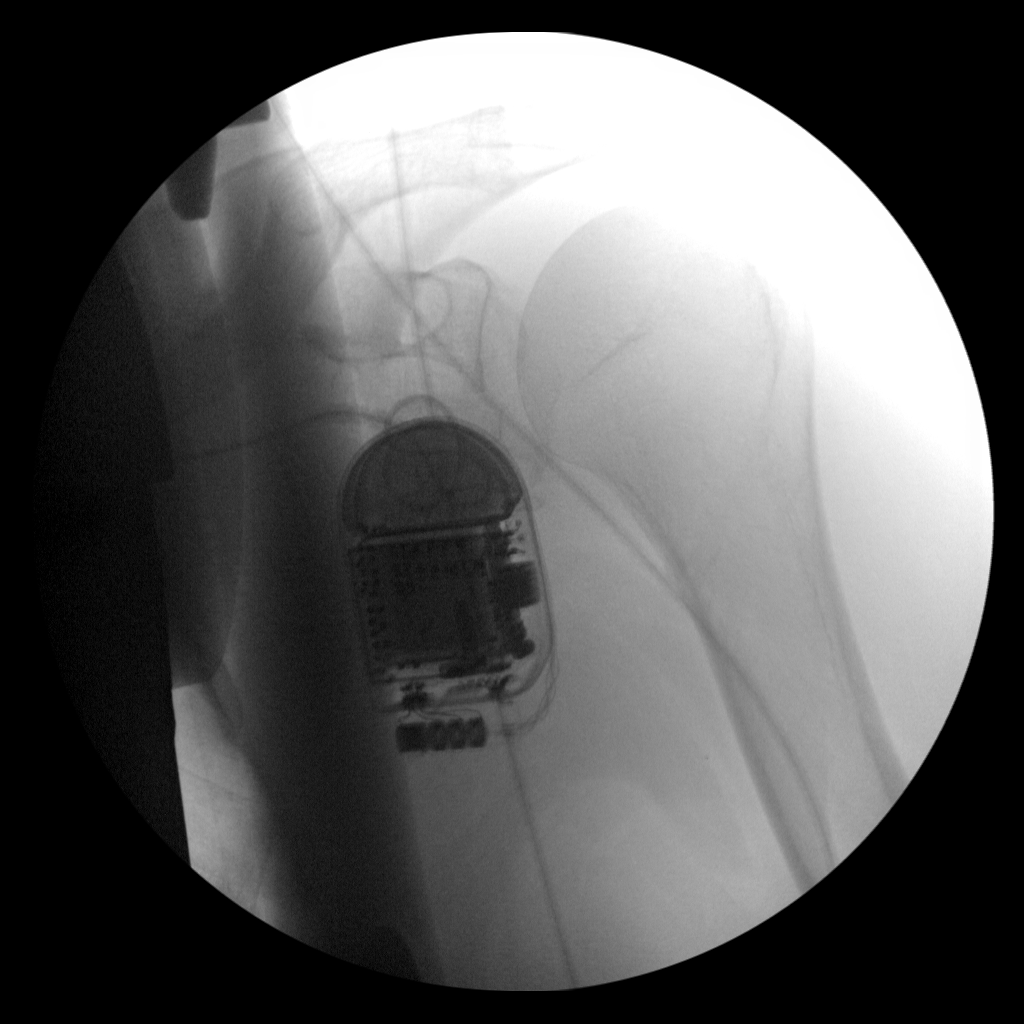

[1 of 1 positions shown; findings below may reference images not displayed]

FINDINGS: Diffuse osseous demineralization.

Pacemaker generator projects over shoulder region.

previously identified LEFT glenohumeral dislocation appears reduced
on single AP view.

No obvious fracture.
IMPRESSION: Apparent reduction of previously identified LEFT glenohumeral
dislocation on single AP view.

## 2016-10-29 ENCOUNTER — Encounter: Payer: Self-pay | Admitting: Cardiology

## 2016-10-30 DIAGNOSIS — G4733 Obstructive sleep apnea (adult) (pediatric): Secondary | ICD-10-CM | POA: Diagnosis not present

## 2016-11-02 NOTE — Progress Notes (Signed)
No ICM remote transmission received for 10/28/2016 and next ICM transmission scheduled for 11/16/2016.

## 2016-11-03 ENCOUNTER — Ambulatory Visit (INDEPENDENT_AMBULATORY_CARE_PROVIDER_SITE_OTHER): Payer: PPO | Admitting: *Deleted

## 2016-11-03 DIAGNOSIS — I255 Ischemic cardiomyopathy: Secondary | ICD-10-CM

## 2016-11-03 DIAGNOSIS — I10 Essential (primary) hypertension: Secondary | ICD-10-CM | POA: Diagnosis not present

## 2016-11-03 DIAGNOSIS — Z95810 Presence of automatic (implantable) cardiac defibrillator: Secondary | ICD-10-CM | POA: Diagnosis not present

## 2016-11-03 DIAGNOSIS — I2510 Atherosclerotic heart disease of native coronary artery without angina pectoris: Secondary | ICD-10-CM | POA: Diagnosis not present

## 2016-11-03 DIAGNOSIS — I5022 Chronic systolic (congestive) heart failure: Secondary | ICD-10-CM

## 2016-11-03 DIAGNOSIS — Z7901 Long term (current) use of anticoagulants: Secondary | ICD-10-CM | POA: Diagnosis not present

## 2016-11-03 DIAGNOSIS — I82409 Acute embolism and thrombosis of unspecified deep veins of unspecified lower extremity: Secondary | ICD-10-CM | POA: Diagnosis not present

## 2016-11-03 NOTE — Progress Notes (Signed)
EPIC Encounter for ICM Monitoring  Patient Name: Kevin Hart is a 71 y.o. male Date: 11/03/2016 Primary Care Physican: Arvind, Moogali M, MD Primary Cardiologist:Jordan Electrophysiologist: Allred Dry Weight:235 lbs         Heart Failure questions reviewed, pt asymptomatic.  Patient [place] for the last 2 weeks during time of decreased impedance.    Thoracic impedance normal.  Prescribed dosage: Furosemide 40 mg 1 tablet daily.   Labs: 12/14/2017Creatinine 0.98, BUN 15, Potassium 3.7, Sodium 140, EGFR >60 11/28/2015 Creatinine 1.39, BUN 29, Potassium 3.8, Sodium 136, EGFR 49-57  10/17/2015 Creatinine 1.09, BUN 15, Potassium 4.1, Sodium 140   Recommendations:  No changes.  Patient will be gone to the beach for month of October and advised him to take the monitor. He requested that I call cell phone in October to reach him.  Encouraged to call for fluid symptoms.  Follow-up plan: ICM clinic phone appointment on 12/04/2016.    Copy of ICM check sent to device physician.   3 month ICM trend: 11/03/2016   1 Year ICM trend:      Laurie S Short, RN 11/03/2016 4:35 PM    

## 2016-11-03 NOTE — Progress Notes (Signed)
ICD Remote transmission 

## 2016-11-04 ENCOUNTER — Other Ambulatory Visit: Payer: Self-pay

## 2016-11-04 LAB — CUP PACEART REMOTE DEVICE CHECK
Battery Voltage: 3.02 V
Brady Statistic RV Percent Paced: 1 %
Date Time Interrogation Session: 20180814013221
HighPow Impedance: 82 Ohm
HighPow Impedance: 82 Ohm
Implantable Lead Location: 753860
Lead Channel Pacing Threshold Amplitude: 0.75 V
Lead Channel Pacing Threshold Pulse Width: 0.5 ms
Lead Channel Sensing Intrinsic Amplitude: 11.8 mV
Lead Channel Setting Sensing Sensitivity: 0.5 mV
MDC IDC LEAD IMPLANT DT: 20160906
MDC IDC MSMT BATTERY REMAINING LONGEVITY: 86 mo
MDC IDC MSMT BATTERY REMAINING PERCENTAGE: 80 %
MDC IDC MSMT LEADCHNL RV IMPEDANCE VALUE: 410 Ohm
MDC IDC PG IMPLANT DT: 20160906
MDC IDC SET LEADCHNL RV PACING AMPLITUDE: 2.5 V
MDC IDC SET LEADCHNL RV PACING PULSEWIDTH: 0.5 ms
Pulse Gen Serial Number: 7257239

## 2016-11-04 MED ORDER — EPLERENONE 25 MG PO TABS: ORAL_TABLET | ORAL | 3 refills | Status: DC

## 2016-11-11 ENCOUNTER — Ambulatory Visit (INDEPENDENT_AMBULATORY_CARE_PROVIDER_SITE_OTHER): Payer: PPO | Admitting: Physician Assistant

## 2016-11-11 ENCOUNTER — Encounter: Payer: Self-pay | Admitting: Physician Assistant

## 2016-11-11 VITALS — BP 102/71 | HR 71 | Ht 73.0 in | Wt 239.2 lb

## 2016-11-11 DIAGNOSIS — I5022 Chronic systolic (congestive) heart failure: Secondary | ICD-10-CM

## 2016-11-11 DIAGNOSIS — Z95810 Presence of automatic (implantable) cardiac defibrillator: Secondary | ICD-10-CM

## 2016-11-11 DIAGNOSIS — G4733 Obstructive sleep apnea (adult) (pediatric): Secondary | ICD-10-CM | POA: Diagnosis not present

## 2016-11-11 DIAGNOSIS — R9439 Abnormal result of other cardiovascular function study: Secondary | ICD-10-CM

## 2016-11-11 DIAGNOSIS — Z9989 Dependence on other enabling machines and devices: Secondary | ICD-10-CM | POA: Diagnosis not present

## 2016-11-11 DIAGNOSIS — I25119 Atherosclerotic heart disease of native coronary artery with unspecified angina pectoris: Secondary | ICD-10-CM | POA: Diagnosis not present

## 2016-11-11 DIAGNOSIS — I10 Essential (primary) hypertension: Secondary | ICD-10-CM

## 2016-11-11 DIAGNOSIS — I825Y9 Chronic embolism and thrombosis of unspecified deep veins of unspecified proximal lower extremity: Secondary | ICD-10-CM

## 2016-11-11 NOTE — Progress Notes (Signed)
Cardiology Office Note    Date:  11/13/2016   ID:  Kevin, Hart [birth year], MRN [id number]  PCP:  Guadlupe Spanish, MD  Cardiologist:  Dr. Peter Martinique; Shelva Majestic, MD(sleep) Primary electrophysiologist: Dr. Rayann Heman  Chief Complaint  Patient presents with  . Follow-up    seen for Dr. Martinique.    History of Present Illness:  Kevin Hart is a 71 y.o. 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 Claiborne Billings. He was admitted in April 2016 and underwent DES to his LAD for in-stent restenosis. Cardiac catheterization at that time showed 90% mid LAD stenosis within old stent, EF 25-30%. Titration of medications was limited by orthostasis. Repeat echocardiogram in May 2017 showed EF 30-35%. He underwent ICD implantation by Dr. Rayann Heman 11/27/2014. He was diagnosed with obstructive sleep apnea at [facility].  I saw the patient on 10/08/2016, he was having recurrent chest discomfort. Myoview obtained on 10/14/2016 showed EF 34%, no EKG changes, large defect of moderate severity present in the basal inferior, mid anterior, mid inferoseptal, mid inferior, apical anterior, apical septal and apical inferior location. Most consistent with previous scar however has very mild peri-infarct ischemia.  He presents today for cardiology office visit, despite recent stress testing results, the degree and duration of his symptom is unchanged. For the last 3 months, it continued to occur only for a few seconds at a time. It usually occurs at rest, when he walks around, he has not experienced any chest pain. Given lack of exertional symptom, no increasing duration and frequency, I recommended continuing current medical therapy. His blood pressure does not allow me to add any antianginal medication this time.    Past Medical  History:  Diagnosis Date  . Arthritis   . BPH (benign prostatic hyperplasia)    takes Flomax daily  . CAD (coronary artery disease)    a. prior LAD stenting;  b. 06/2014 NSTEMI/PCI: LM nl, LAD 30p, 50m ISR (3.0x20 Promus DES), LCX nl, RCA nondom, nl, EF 25-30% ant, apical, dist inf AK.  . Cataracts, bilateral    immature  . Chronic systolic CHF (congestive heart failure) ([place]) 12/31/2014  . DVT (deep venous thrombosis) ([facility])    RECURRENT left leg  . Dyslipidemia   . GERD (gastroesophageal reflux disease)   . Gout   . History of kidney stones   . Hypertension   . Ischemic cardiomyopathy 07/06/2014  . Lupus anticoagulant positive   . S/P drug eluting coronary stent placement, 07/06/14, Promus 07/07/2014  . Sleep apnea   . Subarachnoid bleed ([place]) 2015   after falling and hitting his head    Past Surgical History:  Procedure Laterality Date  . COLONOSCOPY    . EP IMPLANTABLE DEVICE [name] 11/27/2014   St. Jude Medical Fortify Assura VR  implanted by Dr Rayann Heman for primary prevention  . LEFT HEART CATHETERIZATION WITH CORONARY ANGIOGRAM [name] 07/06/2014   Procedure: LEFT HEART CATHETERIZATION WITH CORONARY ANGIOGRAM;  Surgeon: Peter M Martinique, MD;  Location: [facility] CATH LAB;  Service: Cardiovascular;  Laterality: [name];  . PERCUTANEOUS CORONARY STENT INTERVENTION (PCI-S) Right 07/06/2014   Procedure: PERCUTANEOUS CORONARY STENT INTERVENTION (PCI-S);  Surgeon: Peter M Martinique, MD;  Location: [facility] CATH LAB;  Service: Cardiovascular;  Laterality: Right;  . SHOULDER ARTHROSCOPY WITH SUBACROMIAL DECOMPRESSION Left 03/05/2016   Procedure: SHOULDER ARTHROSCOPY ROTATOR  CUFF REPAIR WITH SUBACROMIAL DECOMPRESSION;  Surgeon: Tania Ade, MD;  Location: [place];  Service: Orthopedics;  Laterality: Left;  SHOULDER ARTHROSCOPY ROTATOR CUFF REPAIR WITH SUBACROMIAL DECOMPRESSION  . SHOULDER CLOSED REDUCTION Left 11/29/2015   Procedure: CLOSED REDUCTION SHOULDER;  Surgeon: Tania Ade, MD;  Location: [place];  Service:  Orthopedics;  Laterality: Left;  . WRIST SURGERY Bilateral    plates and screws     Current Medications: Outpatient Medications Prior to Visit  Medication Sig Dispense Refill  . allopurinol (ZYLOPRIM) 300 MG tablet Take 1 tablet by mouth daily.    . Ascorbic Acid (VITAMIN C) 1000 MG tablet Take 1,000 mg by mouth daily.    Marland Kitchen atorvastatin (LIPITOR) 80 MG tablet Take 1 tablet (80 mg total) by mouth daily at 6 PM. 30 tablet 6  . carvedilol (COREG) 6.25 MG tablet TAKE ONE TABLET TWICE DAILY 180 tablet 2  . clopidogrel (PLAVIX) 75 MG tablet TAKE ONE (1) TABLET EACH DAY 30 tablet 6  . eplerenone (INSPRA) 25 MG tablet TAKE ONE (1) TABLET EACH DAY 90 tablet 3  . furosemide (LASIX) 40 MG tablet Take 40 mg by mouth daily.      Marland Kitchen lisinopril (PRINIVIL,ZESTRIL) 2.5 MG tablet TAKE ONE (1) TABLET EACH DAY 30 tablet 6  . Multiple Vitamin (MULTI-VITAMINS) TABS Take 1 tablet by mouth daily.     . nitroGLYCERIN (NITROSTAT) 0.4 MG SL tablet Place 1 tablet (0.4 mg total) under the tongue every 5 (five) minutes x 3 doses as needed for chest pain. 25 tablet 11  . pantoprazole (PROTONIX) 40 MG tablet Take 1 tablet (40 mg total) by mouth daily. 30 tablet 2  . Tamsulosin HCl (FLOMAX) 0.4 MG CAPS Take 0.4 mg by mouth daily.     Marland Kitchen warfarin (COUMADIN) 5 MG tablet Take 1 tablet (5 mg total) by mouth daily. 30 tablet 0   No facility-administered medications prior to visit.      Allergies:   No known allergies   Social History   Social History  . Marital status: Married    Spouse name: [name]  . Number of children: 6  . Years of education: [name]   Social History Main Topics  . Smoking status: Never Smoker  . Smokeless tobacco: Never Used  . Alcohol use No  . Drug use: No  . Sexual activity: Yes    Birth control/ protection: None   Other Topics Concern  . None   Social History Narrative   Pt lives in [place] with spouse.  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     Family History:  The patient's  family history includes Aneurysm in his father.   ROS:   Please see the history of present illness.    ROS All other systems reviewed and are negative.   PHYSICAL EXAM:   VS:  BP 102/71   Pulse 71   Ht 6\' 1"  (1.854 m)   Wt 239 lb 3.2 oz (108.5 kg)   BMI 31.56 kg/m    GEN: Well nourished, well developed, in no acute distress  HEENT: normal  Neck: no JVD, carotid bruits, or masses Cardiac: RRR; no murmurs, rubs, or gallops,no edema  Respiratory:  clear to auscultation bilaterally, normal work of breathing GI: soft, nontender, nondistended, + BS MS: no deformity or atrophy  Skin: warm and dry, no rash Neuro:  Alert and Oriented x 3, Strength and sensation are intact Psych: euthymic mood, full affect  Wt Readings from Last 3 Encounters:  11/11/16 239 lb 3.2 oz (108.5 kg)  10/14/16 241 lb (109.3 kg)  10/08/16 241 lb (109.3 kg)      Studies/Labs Reviewed:   EKG:  EKG is not ordered today.   Recent Labs: 03/05/2016: BUN 15; Creatinine, Ser 0.98; Hemoglobin 14.2; Platelets 186; Potassium 3.7; Sodium 140   Lipid Panel    Component Value Date/Time   CHOL 113 07/25/2014 1029   TRIG 122.0 07/25/2014 1029   HDL 31.90 (L) 07/25/2014 1029   CHOLHDL 4 07/25/2014 1029   VLDL 24.4 07/25/2014 1029   LDLCALC 57 07/25/2014 1029    Additional studies/ records that were reviewed today include:    Echo 07/26/2015 LV EF: 30% -   35%  Study Conclusions  - Left ventricle: There is akinesis of the mid anteroseptal,   anterior, apical septal, anterior, inferior walls and of the true   septum. The cavity size was normal. There was mild concentric   hypertrophy. Systolic function was moderately to severely   reduced. The estimated ejection fraction was in the range of 30%   to 35%. Wall motion was normal; there were no regional wall   motion abnormalities. Doppler parameters are consistent with   abnormal left ventricular relaxation (grade 1 diastolic   dysfunction). There was no  evidence of elevated ventricular   filling pressure by Doppler parameters. - Aortic valve: Trileaflet; normal thickness leaflets. There was   mild regurgitation. - Ascending aorta: The aortic root and ascending aorta were mildly   dilated measuring 40 mm. - Mitral valve: Calcified annulus. There was no regurgitation. - Left atrium: The atrium was mildly dilated. - Right ventricle: Pacer wire or catheter noted in right ventricle.   Systolic function was normal. - Right atrium: Pacer wire or catheter noted in right atrium. - Tricuspid valve: There was mild regurgitation. - Pulmonic valve: There was no regurgitation. - Pulmonary arteries: Systolic pressure was at the upper limits of   normal. PA peak pressure: 33 mm Hg (S). - Inferior vena cava: The vessel was normal in size.    Myoview 10/14/2016 Study Highlights    The left ventricular ejection fraction is moderately decreased (30-44%).  There was no ST segment deviation noted during stress.  There is a large defect of moderate severity present in the basal inferior, mid anterior, mid inferoseptal, mid inferior, apical anterior, apical septal, apical inferior, apical lateral and apex location. The defect is mostly fixed with a small area of reversibility in the inferoseptal region. This is consistent with a large area of scar and mild peri infarct ischemia in the inferoseptal wall.  This is a high risk study due to LV dysfunction and large area of scar with peri infarct ischemia in the inferoseptum.  Nuclear stress EF: 34%.      ASSESSMENT:    1. Abnormal stress test   2. Coronary artery disease involving native coronary artery of native heart with angina pectoris ([place])   3. Chronic deep vein thrombosis (DVT) of proximal vein of lower extremity, unspecified laterality ([place])   4. Chronic systolic heart failure ([place])   5. Essential hypertension   6. ICD (implantable cardioverter-defibrillator) in place   7. OSA on CPAP       PLAN:  In order of problems listed above:  1. CAD: s/p multiple PCI, last PCI was performed on 07/06/2014 with successful DES to mid LAD. Recently he has been having some intermittent chest discomfort, he had Myoview which showed stable EF 34%, large infarct consistent with  previous MI with mild peri-infarct ischemia. On further questioning, her symptom has essentially unchanged and appears to be quite stable at this time. I recommend continue medical therapy and observation instead.  2. Chronic DVT with positive antiphospholipid antibody: On Coumadin  3. Chronic systolic heart failure: Ejection fraction 30-35%. Stable on recent stress test. Euvolemic on physical exam  4. Ischemic cardiomyopathy s/p ICD: Last interrogation on 11/04/2016, device functioning normally  5. Hypertension: Blood pressure stable, continue on carvedilol, eplerenone and lisinopril  6. OSA on CPAP: Managed by Dr. Claiborne Billings    Medication Adjustments/Labs and Tests Ordered: Current medicines are reviewed at length with the patient today.  Concerns regarding medicines are outlined above.  Medication changes, Labs and Tests ordered today are listed in the Patient Instructions below. Patient Instructions  Medication Instructions:   No changes to current regimen  Labwork:   No labwork needed  Testing/Procedures:  No testing needed.  Follow-Up:  Your physician recommends that you schedule a follow-up appointment in: 6 months with Dr. Martinique   If you need a refill on your cardiac medications before your next appointment, please call your pharmacy.      Hilbert Corrigan, [place]  11/13/2016 8:16 AM    [address], [place], [place]  [address] Phone: [phone]; Fax: [phone]

## 2016-11-11 NOTE — Patient Instructions (Signed)
Medication Instructions:   No changes to current regimen  Labwork:   No labwork needed  Testing/Procedures:  No testing needed.  Follow-Up:  Your physician recommends that you schedule a follow-up appointment in: 6 months with Dr. Martinique   If you need a refill on your cardiac medications before your next appointment, please call your pharmacy.

## 2016-11-13 ENCOUNTER — Encounter: Payer: Self-pay | Admitting: Physician Assistant

## 2016-11-17 ENCOUNTER — Ambulatory Visit: Payer: PPO | Admitting: Physician Assistant

## 2016-11-17 ENCOUNTER — Encounter: Payer: Self-pay | Admitting: Cardiology

## 2016-11-30 DIAGNOSIS — G4733 Obstructive sleep apnea (adult) (pediatric): Secondary | ICD-10-CM | POA: Diagnosis not present

## 2016-12-01 DIAGNOSIS — I82409 Acute embolism and thrombosis of unspecified deep veins of unspecified lower extremity: Secondary | ICD-10-CM | POA: Diagnosis not present

## 2016-12-01 DIAGNOSIS — E7800 Pure hypercholesterolemia, unspecified: Secondary | ICD-10-CM | POA: Diagnosis not present

## 2016-12-01 DIAGNOSIS — I10 Essential (primary) hypertension: Secondary | ICD-10-CM | POA: Diagnosis not present

## 2016-12-01 DIAGNOSIS — Z7901 Long term (current) use of anticoagulants: Secondary | ICD-10-CM | POA: Diagnosis not present

## 2016-12-01 DIAGNOSIS — H524 Presbyopia: Secondary | ICD-10-CM | POA: Diagnosis not present

## 2016-12-01 DIAGNOSIS — Z23 Encounter for immunization: Secondary | ICD-10-CM | POA: Diagnosis not present

## 2016-12-04 ENCOUNTER — Ambulatory Visit (INDEPENDENT_AMBULATORY_CARE_PROVIDER_SITE_OTHER): Payer: PPO

## 2016-12-04 DIAGNOSIS — I5022 Chronic systolic (congestive) heart failure: Secondary | ICD-10-CM

## 2016-12-04 DIAGNOSIS — Z95810 Presence of automatic (implantable) cardiac defibrillator: Secondary | ICD-10-CM

## 2016-12-04 NOTE — Progress Notes (Signed)
EPIC Encounter for ICM Monitoring  Patient Name: Kevin Hart is a 71 y.o. male Date: 12/04/2016 Primary Care Physican: Guadlupe Spanish, MD [facility] [place] Electrophysiologist: Allred Dry Weight:230 lbs      Heart Failure questions reviewed, pt asymptomatic.   Thoracic impedance normal but was abnormal suggesting fluid accumulation from 8/30 to 9/5.  His ring finger swells when he has fluid.  Prescribed dosage: Furosemide 40 mg 1 tablet daily.   Labs: 12/14/2017Creatinine 0.98, BUN 15, Potassium 3.7, Sodium 140, EGFR >60 11/28/2015 Creatinine 1.39, BUN 29, Potassium 3.8, Sodium 136, EGFR 49-57  10/17/2015 Creatinine 1.09, BUN 15, Potassium 4.1, Sodium 140   Recommendations: No changes.  Encouraged to call for fluid symptoms.  Follow-up plan: ICM clinic phone appointment on 01/04/2017.    Copy of ICM check sent to Dr. Rayann Heman.   3 month ICM trend: 12/04/2016   1 Year ICM trend:      Rosalene Billings, RN 12/04/2016 7:52 AM

## 2016-12-30 DIAGNOSIS — G4733 Obstructive sleep apnea (adult) (pediatric): Secondary | ICD-10-CM | POA: Diagnosis not present

## 2017-01-04 ENCOUNTER — Ambulatory Visit (INDEPENDENT_AMBULATORY_CARE_PROVIDER_SITE_OTHER): Payer: PPO

## 2017-01-04 DIAGNOSIS — I5022 Chronic systolic (congestive) heart failure: Secondary | ICD-10-CM

## 2017-01-04 DIAGNOSIS — Z95810 Presence of automatic (implantable) cardiac defibrillator: Secondary | ICD-10-CM

## 2017-01-04 NOTE — Progress Notes (Signed)
EPIC Encounter for ICM Monitoring  Patient Name: Kevin Hart is a 71 y.o. male Date: 01/04/2017 Primary Care Physican: Guadlupe Spanish, MD Primary [place] Electrophysiologist: Allred Dry Weight:232 lbs         Heart Failure questions reviewed, pt asymptomatic.   Thoracic impedance normal but was abnormal suggesting fluid accumulation from 12/14/2016 to 12/27/2016.   Patient has been at the beach since 12/12/2016 and eating in restaurants more.   Prescribed dosage: Furosemide 40 mg 1 tablet daily.   Labs: 12/14/2017Creatinine 0.98, BUN 15, Potassium 3.7, Sodium 140, EGFR >60 11/28/2015 Creatinine 1.39, BUN 29, Potassium 3.8, Sodium 136, EGFR 49-57  10/17/2015 Creatinine 1.09, BUN 15, Potassium 4.1, Sodium 140   Recommendations: No changes.   Encouraged to call for fluid symptoms.  Follow-up plan: ICM clinic phone appointment on 02/04/2017.   Copy of ICM check sent to Dr. Rayann Heman.   3 month ICM trend: 01/04/2017   1 Year ICM trend:      Rosalene Billings, RN 01/04/2017 2:16 PM

## 2017-01-27 DIAGNOSIS — C44319 Basal cell carcinoma of skin of other parts of face: Secondary | ICD-10-CM | POA: Diagnosis not present

## 2017-01-27 DIAGNOSIS — Z85828 Personal history of other malignant neoplasm of skin: Secondary | ICD-10-CM | POA: Diagnosis not present

## 2017-01-27 DIAGNOSIS — C4441 Basal cell carcinoma of skin of scalp and neck: Secondary | ICD-10-CM | POA: Diagnosis not present

## 2017-01-27 DIAGNOSIS — C44519 Basal cell carcinoma of skin of other part of trunk: Secondary | ICD-10-CM | POA: Diagnosis not present

## 2017-01-27 DIAGNOSIS — Z08 Encounter for follow-up examination after completed treatment for malignant neoplasm: Secondary | ICD-10-CM | POA: Diagnosis not present

## 2017-01-30 DIAGNOSIS — G4733 Obstructive sleep apnea (adult) (pediatric): Secondary | ICD-10-CM | POA: Diagnosis not present

## 2017-02-02 DIAGNOSIS — H40003 Preglaucoma, unspecified, bilateral: Secondary | ICD-10-CM | POA: Diagnosis not present

## 2017-02-02 DIAGNOSIS — H25043 Posterior subcapsular polar age-related cataract, bilateral: Secondary | ICD-10-CM | POA: Diagnosis not present

## 2017-02-02 DIAGNOSIS — H25012 Cortical age-related cataract, left eye: Secondary | ICD-10-CM | POA: Diagnosis not present

## 2017-02-02 DIAGNOSIS — I10 Essential (primary) hypertension: Secondary | ICD-10-CM | POA: Diagnosis not present

## 2017-02-02 DIAGNOSIS — H2513 Age-related nuclear cataract, bilateral: Secondary | ICD-10-CM | POA: Diagnosis not present

## 2017-02-02 DIAGNOSIS — H2512 Age-related nuclear cataract, left eye: Secondary | ICD-10-CM | POA: Diagnosis not present

## 2017-02-02 DIAGNOSIS — H25013 Cortical age-related cataract, bilateral: Secondary | ICD-10-CM | POA: Diagnosis not present

## 2017-02-04 ENCOUNTER — Ambulatory Visit (INDEPENDENT_AMBULATORY_CARE_PROVIDER_SITE_OTHER): Payer: PPO

## 2017-02-04 ENCOUNTER — Ambulatory Visit (INDEPENDENT_AMBULATORY_CARE_PROVIDER_SITE_OTHER): Payer: PPO | Admitting: *Deleted

## 2017-02-04 ENCOUNTER — Telehealth: Payer: Self-pay

## 2017-02-04 DIAGNOSIS — I5022 Chronic systolic (congestive) heart failure: Secondary | ICD-10-CM | POA: Diagnosis not present

## 2017-02-04 DIAGNOSIS — Z95810 Presence of automatic (implantable) cardiac defibrillator: Secondary | ICD-10-CM

## 2017-02-04 DIAGNOSIS — I255 Ischemic cardiomyopathy: Secondary | ICD-10-CM | POA: Diagnosis not present

## 2017-02-04 NOTE — Progress Notes (Signed)
Remote ICD transmission.   

## 2017-02-04 NOTE — Telephone Encounter (Signed)
Remote ICM transmission received.  Attempted call to patient and left message to return call. 

## 2017-02-04 NOTE — Progress Notes (Signed)
Patient returned call.  He stated he has been at the beach for the last 2 months and gained weight.  Baseline is approximately 233 lbs and he was 240 lbs today.  Advised to take Furosemide 40mg  1 tablet x 2 days and then return to 1 tablet daily. Will recheck fluid levels 02/18/2017.

## 2017-02-04 NOTE — Progress Notes (Signed)
EPIC Encounter for ICM Monitoring  Patient Name: Kevin Hart is a 71 y.o. male Date: 02/04/2017 Primary Care Physican: Guadlupe Spanish, MD Primary [place] Electrophysiologist: Allred Dry Weight:240lbs      Attempted call to patient and unable to reach.  Left message to return call. Transmission reviewed.    Thoracic impedance abnormal suggesting fluid accumulation since 01/27/2017.  Prescribed dosage: Furosemide 40 mg 1 tablet daily.   Labs: 12/14/2017Creatinine 0.98, BUN 15, Potassium 3.7, Sodium 140, EGFR >60 11/28/2015 Creatinine 1.39, BUN 29, Potassium 3.8, Sodium 136, EGFR 49-57  10/17/2015 Creatinine 1.09, BUN 15, Potassium 4.1, Sodium 140   Recommendations: NONE - Unable to reach.  Follow-up plan: ICM clinic phone appointment on 02/22/2017 to recheck fluid levels  Copy of ICM check sent to Dr. Rayann Heman and Dr. Martinique.   3 month ICM trend: 02/04/2017    1 Year ICM trend:       Rosalene Billings, RN 02/04/2017 11:05 AM

## 2017-02-10 ENCOUNTER — Encounter: Payer: Self-pay | Admitting: Cardiology

## 2017-02-16 DIAGNOSIS — Z7901 Long term (current) use of anticoagulants: Secondary | ICD-10-CM | POA: Diagnosis not present

## 2017-02-16 DIAGNOSIS — E7800 Pure hypercholesterolemia, unspecified: Secondary | ICD-10-CM | POA: Diagnosis not present

## 2017-02-16 DIAGNOSIS — I10 Essential (primary) hypertension: Secondary | ICD-10-CM | POA: Diagnosis not present

## 2017-02-18 ENCOUNTER — Ambulatory Visit (INDEPENDENT_AMBULATORY_CARE_PROVIDER_SITE_OTHER): Payer: Self-pay

## 2017-02-18 DIAGNOSIS — Z95810 Presence of automatic (implantable) cardiac defibrillator: Secondary | ICD-10-CM

## 2017-02-18 DIAGNOSIS — I5022 Chronic systolic (congestive) heart failure: Secondary | ICD-10-CM

## 2017-02-19 LAB — CUP PACEART REMOTE DEVICE CHECK
Battery Voltage: 3.02 V
Date Time Interrogation Session: 20181115074531
HIGH POWER IMPEDANCE MEASURED VALUE: 72 Ohm
HighPow Impedance: 72 Ohm
Implantable Lead Implant Date: 20160906
Implantable Lead Location: 753860
Lead Channel Sensing Intrinsic Amplitude: 10.6 mV
Lead Channel Setting Pacing Amplitude: 2.5 V
MDC IDC MSMT BATTERY REMAINING LONGEVITY: 84 mo
MDC IDC MSMT BATTERY REMAINING PERCENTAGE: 78 %
MDC IDC MSMT LEADCHNL RV IMPEDANCE VALUE: 410 Ohm
MDC IDC MSMT LEADCHNL RV PACING THRESHOLD AMPLITUDE: 0.75 V
MDC IDC MSMT LEADCHNL RV PACING THRESHOLD PULSEWIDTH: 0.5 ms
MDC IDC PG IMPLANT DT: 20160906
MDC IDC SET LEADCHNL RV PACING PULSEWIDTH: 0.5 ms
MDC IDC SET LEADCHNL RV SENSING SENSITIVITY: 0.5 mV
MDC IDC STAT BRADY RV PERCENT PACED: 1 %
Pulse Gen Serial Number: 7257239

## 2017-02-19 NOTE — Progress Notes (Signed)
EPIC Encounter for ICM Monitoring  Patient Name: Kevin Hart is a 71 y.o. male Date: 02/19/2017 Primary Care Physican: Guadlupe Spanish, MD Primary [place] Electrophysiologist: Allred Dry Weight:238lbs      Heart Failure questions reviewed, pt asymptomatic.  Baseline is approximately 233 lbs, he did lose 2 lbs after taking extra Furosemide   Thoracic impedance returned to normal after taking 2 days of extra Furosemide.   Prescribed dosage: Furosemide 40 mg 1 tablet daily.   Labs: 12/14/2017Creatinine 0.98, BUN 15, Potassium 3.7, Sodium 140, EGFR >60 11/28/2015 Creatinine 1.39, BUN 29, Potassium 3.8, Sodium 136, EGFR 49-57  10/17/2015 Creatinine 1.09, BUN 15, Potassium 4.1, Sodium 140  Recommendations: No changes.   Encouraged to call for fluid symptoms.  Follow-up plan: ICM clinic phone appointment on 03/09/2017.    Copy of ICM check sent to Dr. Rayann Heman.   3 month ICM trend: 02/18/2017    1 Year ICM trend:       Rosalene Billings, RN 02/19/2017 2:04 PM

## 2017-03-01 DIAGNOSIS — G4733 Obstructive sleep apnea (adult) (pediatric): Secondary | ICD-10-CM | POA: Diagnosis not present

## 2017-03-09 ENCOUNTER — Ambulatory Visit (INDEPENDENT_AMBULATORY_CARE_PROVIDER_SITE_OTHER): Payer: PPO

## 2017-03-09 DIAGNOSIS — I5022 Chronic systolic (congestive) heart failure: Secondary | ICD-10-CM

## 2017-03-09 DIAGNOSIS — Z95810 Presence of automatic (implantable) cardiac defibrillator: Secondary | ICD-10-CM

## 2017-03-11 NOTE — Progress Notes (Signed)
EPIC Encounter for ICM Monitoring  Patient Name: Kevin Hart is a 71 y.o. male Date: 03/11/2017 Primary Care Physican: Guadlupe Spanish, MD Primary [place] Electrophysiologist: Allred Dry Weight:Previous weight 238lbs         Transmission received.   Thoracic impedance normal.  Prescribed dosage: Furosemide 40 mg 1 tablet daily.   Labs: 12/14/2017Creatinine 0.98, BUN 15, Potassium 3.7, Sodium 140, EGFR >60 11/28/2015 Creatinine 1.39, BUN 29, Potassium 3.8, Sodium 136, EGFR 49-57  10/17/2015 Creatinine 1.09, BUN 15, Potassium 4.1, Sodium 140  Recommendations: None.  Follow-up plan: ICM clinic phone appointment on 04/09/2017.    Copy of ICM check sent to Dr. Rayann Heman.   3 month ICM trend: 03/09/2017    1 Year ICM trend:       Rosalene Billings, RN 03/11/2017 6:12 PM

## 2017-03-29 DIAGNOSIS — Z7901 Long term (current) use of anticoagulants: Secondary | ICD-10-CM | POA: Diagnosis not present

## 2017-03-29 DIAGNOSIS — I82409 Acute embolism and thrombosis of unspecified deep veins of unspecified lower extremity: Secondary | ICD-10-CM | POA: Diagnosis not present

## 2017-03-29 DIAGNOSIS — I2510 Atherosclerotic heart disease of native coronary artery without angina pectoris: Secondary | ICD-10-CM | POA: Diagnosis not present

## 2017-03-29 DIAGNOSIS — I10 Essential (primary) hypertension: Secondary | ICD-10-CM | POA: Diagnosis not present

## 2017-03-29 DIAGNOSIS — Z0000 Encounter for general adult medical examination without abnormal findings: Secondary | ICD-10-CM | POA: Diagnosis not present

## 2017-04-01 DIAGNOSIS — G4733 Obstructive sleep apnea (adult) (pediatric): Secondary | ICD-10-CM | POA: Diagnosis not present

## 2017-04-09 ENCOUNTER — Ambulatory Visit (INDEPENDENT_AMBULATORY_CARE_PROVIDER_SITE_OTHER): Payer: PPO

## 2017-04-09 DIAGNOSIS — Z95810 Presence of automatic (implantable) cardiac defibrillator: Secondary | ICD-10-CM

## 2017-04-09 DIAGNOSIS — I5022 Chronic systolic (congestive) heart failure: Secondary | ICD-10-CM | POA: Diagnosis not present

## 2017-04-09 NOTE — Progress Notes (Signed)
EPIC Encounter for ICM Monitoring  Patient Name: Kevin Hart is a 72 y.o. male Date: 04/09/2017 Primary Care Physican: Guadlupe Spanish, MD Primary [place] Electrophysiologist: Allred Dry Weight:Previous weight 238lbs      Heart Failure questions reviewed, pt asymptomatic.   Thoracic impedance normal.  Prescribed dosage: Furosemide 40 mg 1 tablet daily.   Labs: 12/14/2017Creatinine 0.98, BUN 15, Potassium 3.7, Sodium 140, EGFR >60 11/28/2015 Creatinine 1.39, BUN 29, Potassium 3.8, Sodium 136, EGFR 49-57  10/17/2015 Creatinine 1.09, BUN 15, Potassium 4.1, Sodium 140  Recommendations: No changes.  Encouraged to call for fluid symptoms.  Follow-up plan: ICM clinic phone appointment on 05/31/2017.  Office appointment scheduled 05/03/2017 with Dr. Rayann Heman.  Copy of ICM check sent to Dr. Rayann Heman.   3 month ICM trend: 04/09/2017  Furosemide 40 mg 1 tablet daily.   Labs: 12/14/2017Creatinine 0.98, BUN 15, Potassium 3.7, Sodium 140, EGFR >60 11/28/2015 Creatinine 1.39, BUN 29, Potassium 3.8, Sodium 136, EGFR 49-57  10/17/2015 Creatinine 1.09, BUN 15, Potassium 4.1, Sodium 140  1 Year ICM trend:       Rosalene Billings, RN 04/09/2017 4:46 PM

## 2017-04-12 DIAGNOSIS — H2512 Age-related nuclear cataract, left eye: Secondary | ICD-10-CM | POA: Diagnosis not present

## 2017-04-12 DIAGNOSIS — H2511 Age-related nuclear cataract, right eye: Secondary | ICD-10-CM | POA: Diagnosis not present

## 2017-04-13 DIAGNOSIS — H2511 Age-related nuclear cataract, right eye: Secondary | ICD-10-CM | POA: Diagnosis not present

## 2017-04-21 DIAGNOSIS — H2512 Age-related nuclear cataract, left eye: Secondary | ICD-10-CM | POA: Diagnosis not present

## 2017-04-27 ENCOUNTER — Encounter: Payer: Self-pay | Admitting: Internal Medicine

## 2017-04-28 DIAGNOSIS — I82409 Acute embolism and thrombosis of unspecified deep veins of unspecified lower extremity: Secondary | ICD-10-CM | POA: Diagnosis not present

## 2017-04-28 DIAGNOSIS — M129 Arthropathy, unspecified: Secondary | ICD-10-CM | POA: Diagnosis not present

## 2017-04-28 DIAGNOSIS — R5383 Other fatigue: Secondary | ICD-10-CM | POA: Diagnosis not present

## 2017-04-28 DIAGNOSIS — E291 Testicular hypofunction: Secondary | ICD-10-CM | POA: Diagnosis not present

## 2017-04-28 DIAGNOSIS — R300 Dysuria: Secondary | ICD-10-CM | POA: Diagnosis not present

## 2017-04-28 DIAGNOSIS — I10 Essential (primary) hypertension: Secondary | ICD-10-CM | POA: Diagnosis not present

## 2017-04-28 DIAGNOSIS — E559 Vitamin D deficiency, unspecified: Secondary | ICD-10-CM | POA: Diagnosis not present

## 2017-04-28 DIAGNOSIS — E7800 Pure hypercholesterolemia, unspecified: Secondary | ICD-10-CM | POA: Diagnosis not present

## 2017-04-28 DIAGNOSIS — Z7901 Long term (current) use of anticoagulants: Secondary | ICD-10-CM | POA: Diagnosis not present

## 2017-04-28 DIAGNOSIS — D539 Nutritional anemia, unspecified: Secondary | ICD-10-CM | POA: Diagnosis not present

## 2017-05-02 DIAGNOSIS — G4733 Obstructive sleep apnea (adult) (pediatric): Secondary | ICD-10-CM | POA: Diagnosis not present

## 2017-05-03 ENCOUNTER — Ambulatory Visit: Payer: PPO | Admitting: Internal Medicine

## 2017-05-03 ENCOUNTER — Encounter: Payer: Self-pay | Admitting: Internal Medicine

## 2017-05-03 VITALS — BP 108/70 | HR 75 | Ht 72.0 in | Wt 243.0 lb

## 2017-05-03 DIAGNOSIS — Z9989 Dependence on other enabling machines and devices: Secondary | ICD-10-CM

## 2017-05-03 DIAGNOSIS — G4733 Obstructive sleep apnea (adult) (pediatric): Secondary | ICD-10-CM

## 2017-05-03 DIAGNOSIS — I5022 Chronic systolic (congestive) heart failure: Secondary | ICD-10-CM | POA: Diagnosis not present

## 2017-05-03 DIAGNOSIS — I825Y9 Chronic embolism and thrombosis of unspecified deep veins of unspecified proximal lower extremity: Secondary | ICD-10-CM | POA: Diagnosis not present

## 2017-05-03 DIAGNOSIS — I10 Essential (primary) hypertension: Secondary | ICD-10-CM | POA: Diagnosis not present

## 2017-05-03 DIAGNOSIS — I255 Ischemic cardiomyopathy: Secondary | ICD-10-CM

## 2017-05-03 LAB — CUP PACEART INCLINIC DEVICE CHECK
Date Time Interrogation Session: 20190211170509
HighPow Impedance: 78.75 Ohm
Implantable Lead Location: 753860
Implantable Pulse Generator Implant Date: 20160906
Lead Channel Pacing Threshold Amplitude: 0.75 V
Lead Channel Pacing Threshold Pulse Width: 0.5 ms
Lead Channel Setting Pacing Amplitude: 2.5 V
Lead Channel Setting Pacing Pulse Width: 0.5 ms
MDC IDC LEAD IMPLANT DT: 20160906
MDC IDC MSMT BATTERY REMAINING LONGEVITY: 82 mo
MDC IDC MSMT LEADCHNL RV IMPEDANCE VALUE: 462.5 Ohm
MDC IDC MSMT LEADCHNL RV PACING THRESHOLD AMPLITUDE: 0.75 V
MDC IDC MSMT LEADCHNL RV PACING THRESHOLD PULSEWIDTH: 0.5 ms
MDC IDC MSMT LEADCHNL RV SENSING INTR AMPL: 11.8 mV
MDC IDC PG SERIAL: 7257239
MDC IDC SET LEADCHNL RV SENSING SENSITIVITY: 0.5 mV
MDC IDC STAT BRADY RV PERCENT PACED: 0.05 %

## 2017-05-03 NOTE — Progress Notes (Signed)
PCP: Guadlupe Spanish, MD Primary Cardiologist:  Dr Martinique Primary EP: Dr Joaquin Music is a 72 y.o. male who presents today for routine electrophysiology followup.  Since last being seen in our clinic, the patient reports doing very well.  Today, he denies symptoms of palpitations, chest pain, shortness of breath,  lower extremity edema, dizziness, presyncope, syncope, or ICD shocks.  + bruising.  The patient is otherwise without complaint today.   Past Medical History:  Diagnosis Date  . Arthritis   . BPH (benign prostatic hyperplasia)    takes Flomax daily  . CAD (coronary artery disease)    a. prior LAD stenting;  b. 06/2014 NSTEMI/PCI: LM nl, LAD 30p, 69m ISR (3.0x20 Promus DES), LCX nl, RCA nondom, nl, EF 25-30% ant, apical, dist inf AK.  . Cataracts, bilateral    immature  . Chronic systolic CHF (congestive heart failure) ([place]) 12/31/2014  . DVT (deep venous thrombosis) ([facility])    RECURRENT left leg  . Dyslipidemia   . GERD (gastroesophageal reflux disease)   . Gout   . History of kidney stones   . Hypertension   . Ischemic cardiomyopathy 07/06/2014  . Lupus anticoagulant positive   . S/P drug eluting coronary stent placement, 07/06/14, Promus 07/07/2014  . Sleep apnea   . Subarachnoid bleed ([place]) 2015   after falling and hitting his head   Past Surgical History:  Procedure Laterality Date  . COLONOSCOPY    . EP IMPLANTABLE DEVICE [name] 11/27/2014   St. Jude Medical Fortify Assura VR  implanted by Dr Rayann Heman for primary prevention  . LEFT HEART CATHETERIZATION WITH CORONARY ANGIOGRAM [name] 07/06/2014   Procedure: LEFT HEART CATHETERIZATION WITH CORONARY ANGIOGRAM;  Surgeon: Peter M Martinique, MD;  Location: [facility] CATH LAB;  Service: Cardiovascular;  Laterality: [name];  . PERCUTANEOUS CORONARY STENT INTERVENTION (PCI-S) Right 07/06/2014   Procedure: PERCUTANEOUS CORONARY STENT INTERVENTION (PCI-S);  Surgeon: Peter M Martinique, MD;  Location: [facility] CATH LAB;  Service: Cardiovascular;   Laterality: Right;  . SHOULDER ARTHROSCOPY WITH SUBACROMIAL DECOMPRESSION Left 03/05/2016   Procedure: SHOULDER ARTHROSCOPY ROTATOR CUFF REPAIR WITH SUBACROMIAL DECOMPRESSION;  Surgeon: Tania Ade, MD;  Location: [place];  Service: Orthopedics;  Laterality: Left;  SHOULDER ARTHROSCOPY ROTATOR CUFF REPAIR WITH SUBACROMIAL DECOMPRESSION  . SHOULDER CLOSED REDUCTION Left 11/29/2015   Procedure: CLOSED REDUCTION SHOULDER;  Surgeon: Tania Ade, MD;  Location: [place];  Service: Orthopedics;  Laterality: Left;  . WRIST SURGERY Bilateral    plates and screws     ROS- all systems are reviewed and negative except as per HPI above  Current Outpatient Medications  Medication Sig Dispense Refill  . allopurinol (ZYLOPRIM) 300 MG tablet Take 1 tablet by mouth daily.    . Ascorbic Acid (VITAMIN C) 1000 MG tablet Take 1,000 mg by mouth daily.    Marland Kitchen atorvastatin (LIPITOR) 80 MG tablet Take 1 tablet (80 mg total) by mouth daily at 6 PM. 30 tablet 6  . carvedilol (COREG) 6.25 MG tablet TAKE ONE TABLET TWICE DAILY 180 tablet 2  . clopidogrel (PLAVIX) 75 MG tablet TAKE ONE (1) TABLET EACH DAY 30 tablet 6  . eplerenone (INSPRA) 25 MG tablet TAKE ONE (1) TABLET EACH DAY 90 tablet 3  . furosemide (LASIX) 40 MG tablet Take 40 mg by mouth daily.      Marland Kitchen lisinopril (PRINIVIL,ZESTRIL) 2.5 MG tablet TAKE ONE (1) TABLET EACH DAY 30 tablet 6  . Multiple Vitamin (MULTI-VITAMINS) TABS Take 1 tablet by mouth daily.     Marland Kitchen  nitroGLYCERIN (NITROSTAT) 0.4 MG SL tablet Place 1 tablet (0.4 mg total) under the tongue every 5 (five) minutes x 3 doses as needed for chest pain. 25 tablet 11  . pantoprazole (PROTONIX) 40 MG tablet Take 1 tablet (40 mg total) by mouth daily. 30 tablet 2  . Tamsulosin HCl (FLOMAX) 0.4 MG CAPS Take 0.4 mg by mouth daily.     Marland Kitchen warfarin (COUMADIN) 5 MG tablet Take 1 tablet (5 mg total) by mouth daily. 30 tablet 0   No current facility-administered medications for this visit.     Physical  Exam: Vitals:   05/03/17 1616  BP: 108/70  Pulse: 75  Weight: 243 lb (110.2 kg)  Height: 6' (1.829 m)    GEN- The patient is well appearing, alert and oriented x 3 today.   Head- normocephalic, atraumatic Eyes-  Sclera clear, conjunctiva pink Ears- hearing intact Oropharynx- clear Lungs- Clear to ausculation bilaterally, normal work of breathing Chest- ICD pocket is well healed Heart- Regular rate and rhythm, no murmurs, rubs or gallops, PMI not laterally displaced GI- soft, NT, ND, + BS Extremities- no clubbing, cyanosis, or edema  ICD interrogation- reviewed in detail today,  See PACEART report  ekg tracing ordered today is personally reviewed and shows sinus rhythm 75 bpm, PR 236 msec, QRS 86 msec, Qtc 408 msec, anteroseptal infarct pattern  Assessment and Plan:  1.  Chronic systolic dysfunction/ CAD/ ischemic CM euvolemic today Stable on an appropriate medical regimen Normal ICD function See [facility] report No changes today Could consider entresto (will defer to Dr Martinique who will see him in a couple weeks)  2. OSA Compliant with BiPAP Followed by Dr Claiborne Billings  3. Prior DVT/ antiphospholipid Ab syndrome On coumadin Could consider stopping plavix however he has had ISR previously.  (defer to Dr Martinique)  4. Hypertensive cardiovascular disease with CHF Stable No change required today    [place] Return to see EP NP every year Follow-up with Dr Martinique as scheduled May be able to stop plavix upon follow-up with Dr Martinique as he is also on [facility].  Will defer this decision to Dr Martinique.  Thompson Grayer MD, [facility] 05/03/2017 4:32 PM

## 2017-05-03 NOTE — Patient Instructions (Signed)
Medication Instructions:  Your physician recommends that you continue on your current medications as directed. Please refer to the Current Medication list given to you today.   Labwork: None ordered  Testing/Procedures: None ordered  Follow-Up: Remote monitoring is used to monitor your ICD from home. This monitoring reduces the number of office visits required to check your device to one time per year. It allows [place] to keep an eye on the functioning of your device to ensure it is working properly. You are scheduled for a device check from home on 05/06/17. You may send your transmission at any time that day. If you have a wireless device, the transmission will be sent automatically. After your physician reviews your transmission, you will receive a postcard with your next transmission date.  Your physician wants you to follow-up in: 1 year with Chanetta Marshall, NP. You will receive a reminder letter in the mail two months in advance. If you don't receive a letter, please call our office to schedule the follow-up appointment.   Any Other Special Instructions Will Be Listed Below (If Applicable).     If you need a refill on your cardiac medications before your next appointment, please call your pharmacy.

## 2017-05-06 ENCOUNTER — Ambulatory Visit (INDEPENDENT_AMBULATORY_CARE_PROVIDER_SITE_OTHER): Payer: PPO | Admitting: *Deleted

## 2017-05-06 DIAGNOSIS — I255 Ischemic cardiomyopathy: Secondary | ICD-10-CM | POA: Diagnosis not present

## 2017-05-06 NOTE — Progress Notes (Signed)
Remote ICD transmission.   

## 2017-05-10 DIAGNOSIS — H2511 Age-related nuclear cataract, right eye: Secondary | ICD-10-CM | POA: Diagnosis not present

## 2017-05-11 NOTE — Progress Notes (Signed)
Cardiology Office Note    Date:  05/13/2017   ID:  Syris, Brookens 02-03-1946, MRN [id number]  PCP:  Guadlupe Spanish, MD  Cardiologist:  Dr. Peter Martinique; Shelva Majestic, MD(sleep) Primary electrophysiologist: Dr. Rayann Heman  Chief Complaint  Patient presents with  . Coronary Artery Disease  . Congestive Heart Failure    History of Present Illness:  KURK CORNIEL is a 72 y.o. 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 Claiborne Billings. He was admitted in April 2016 and underwent DES to his LAD for in-stent restenosis. Cardiac catheterization at that time showed 90% mid LAD stenosis within old stent, EF 25-30%. Titration of medications was limited by orthostasis. Repeat echocardiogram in May 2017 showed EF 30-35%. He underwent ICD implantation by Dr. Rayann Heman 11/27/2014. He was diagnosed with obstructive sleep apnea at [facility].  When seen on 10/08/2016, he was having recurrent chest discomfort. Myoview obtained on 10/14/2016 showed EF 34%, no EKG changes, large defect of moderate severity present in the basal inferior, mid anterior, mid inferoseptal, mid inferior, apical anterior, apical septal and apical inferior location. Most consistent with previous scar however has very mild peri-infarct ischemia. Last ICD check on May 03, 2017 was normal.  On follow up today he reports he is doing OK. He has infrequent quick fleeting chest pain. He denies any significant SOB. He has chronic swelling in the left ankle (from prior DVT). No orthopnea or PND. He is only taking lisinopril 2.5 mg daily due to history of orthostasis. No dizziness or syncope.     Past Medical History:  Diagnosis Date  . Arthritis   . BPH (benign prostatic hyperplasia)    takes Flomax daily  . CAD (coronary artery disease)    a. prior LAD  stenting;  b. 06/2014 NSTEMI/PCI: LM nl, LAD 30p, 41m ISR (3.0x20 Promus DES), LCX nl, RCA nondom, nl, EF 25-30% ant, apical, dist inf AK.  . Cataracts, bilateral    immature  . Chronic systolic CHF (congestive heart failure) ([place]) 12/31/2014  . DVT (deep venous thrombosis) ([facility])    RECURRENT left leg  . Dyslipidemia   . GERD (gastroesophageal reflux disease)   . Gout   . History of kidney stones   . Hypertension   . Ischemic cardiomyopathy 07/06/2014  . Lupus anticoagulant positive   . S/P drug eluting coronary stent placement, 07/06/14, Promus 07/07/2014  . Sleep apnea   . Subarachnoid bleed ([place]) 2015   after falling and hitting his head    Past Surgical History:  Procedure Laterality Date  . COLONOSCOPY    . EP IMPLANTABLE DEVICE [name] 11/27/2014   St. Jude Medical Fortify Assura VR  implanted by Dr Rayann Heman for primary prevention  . LEFT HEART CATHETERIZATION WITH CORONARY ANGIOGRAM [name] 07/06/2014   Procedure: LEFT HEART CATHETERIZATION WITH CORONARY ANGIOGRAM;  Surgeon: Peter M Martinique, MD;  Location[facility] CATH LAB;  Service: Cardiovascular;  Laterality: [name];  . PERCUTANEOUS CORONARY STENT INTERVENTION (PCI-S) Right 07/06/2014   Procedure: PERCUTANEOUS CORONARY STENT INTERVENTION (PCI-S);  Surgeon: Peter M Martinique, MD;  Location: [facility] CATH LAB;  Service: Cardiovascular;  Laterality: Right;  . SHOULDER ARTHROSCOPY WITH SUBACROMIAL DECOMPRESSION Left 03/05/2016   Procedure: SHOULDER ARTHROSCOPY ROTATOR CUFF REPAIR WITH SUBACROMIAL DECOMPRESSION;  Surgeon: Tania Ade, MD;  Location: [place];  Service: Orthopedics;  Laterality: Left;  SHOULDER  ARTHROSCOPY ROTATOR CUFF REPAIR WITH SUBACROMIAL DECOMPRESSION  . SHOULDER CLOSED REDUCTION Left 11/29/2015   Procedure: CLOSED REDUCTION SHOULDER;  Surgeon: Tania Ade, MD;  Location: [place];  Service: Orthopedics;  Laterality: Left;  . WRIST SURGERY Bilateral    plates and screws     Current Medications: Outpatient Medications Prior to Visit    Medication Sig Dispense Refill  . allopurinol (ZYLOPRIM) 300 MG tablet Take 1 tablet by mouth daily.    . Ascorbic Acid (VITAMIN C) 1000 MG tablet Take 1,000 mg by mouth daily.    Marland Kitchen atorvastatin (LIPITOR) 80 MG tablet Take 1 tablet (80 mg total) by mouth daily at 6 PM. 30 tablet 6  . carvedilol (COREG) 6.25 MG tablet Take 6.25 mg by mouth 2 (two) times daily with a meal.    . clopidogrel (PLAVIX) 75 MG tablet Take 75 mg by mouth daily.    Marland Kitchen eplerenone (INSPRA) 25 MG tablet Take 25 mg by mouth daily.    . furosemide (LASIX) 40 MG tablet Take 40 mg by mouth daily.      Marland Kitchen lisinopril (PRINIVIL,ZESTRIL) 2.5 MG tablet Take 2.5 mg by mouth daily.    . Multiple Vitamin (MULTI-VITAMINS) TABS Take 1 tablet by mouth daily.     . nitroGLYCERIN (NITROSTAT) 0.4 MG SL tablet Place 1 tablet (0.4 mg total) under the tongue every 5 (five) minutes x 3 doses as needed for chest pain. 25 tablet 11  . pantoprazole (PROTONIX) 40 MG tablet Take 1 tablet (40 mg total) by mouth daily. 30 tablet 2  . Tamsulosin HCl (FLOMAX) 0.4 MG CAPS Take 0.4 mg by mouth daily.     Marland Kitchen warfarin (COUMADIN) 5 MG tablet Take 1 tablet (5 mg total) by mouth daily. 30 tablet 0  . carvedilol (COREG) 6.25 MG tablet TAKE ONE TABLET TWICE DAILY (Patient not taking: Reported on 05/13/2017) 180 tablet 2  . clopidogrel (PLAVIX) 75 MG tablet TAKE ONE (1) TABLET EACH DAY (Patient not taking: Reported on 05/13/2017) 30 tablet 6  . eplerenone (INSPRA) 25 MG tablet TAKE ONE (1) TABLET EACH DAY (Patient not taking: Reported on 05/13/2017) 90 tablet 3  . lisinopril (PRINIVIL,ZESTRIL) 2.5 MG tablet TAKE ONE (1) TABLET EACH DAY (Patient not taking: Reported on 05/13/2017) 30 tablet 6   No facility-administered medications prior to visit.      Allergies:   No known allergies   Social History   Socioeconomic History  . Marital status: Married    Spouse name: None  . Number of children: 6  . Years of education: None  . Highest education level: None   Social Needs  . Financial resource strain: None  . Food insecurity - worry: None  . Food insecurity - inability: None  . Transportation needs - medical: None  . Transportation needs - non-medical: None  Occupational History  . None  Tobacco Use  . Smoking status: Never Smoker  . Smokeless tobacco: Never Used  Substance and Sexual Activity  . Alcohol use: No    Alcohol/week: 0.0 oz  . Drug use: No  . Sexual activity: Yes    Birth control/protection: None  Other Topics Concern  . None  Social History Narrative   Pt lives in [place] with spouse.  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     Family History:  The patient's family history includes Aneurysm in his father.   ROS:   Please see the history of present illness.    ROS All other systems reviewed  and are negative.   PHYSICAL EXAM:   VS:  BP 90/64   Pulse 84   Ht 6' (1.829 m)   Wt 245 lb 3.2 oz (111.2 kg)   BMI 33.26 kg/m    GENERAL:  Well appearing, overweight WM in NAD HEENT:  PERRL, EOMI, sclera are clear. Oropharynx is clear. NECK:  No jugular venous distention, carotid upstroke brisk and symmetric, no bruits, no thyromegaly or adenopathy LUNGS:  Clear to auscultation bilaterally CHEST:  Unremarkable HEART:  RRR,  PMI not displaced or sustained,S1 and S2 within normal limits, no S3, no S4: no clicks, no rubs, no murmurs ABD:  Soft, nontender. BS +, no masses or bruits. No hepatomegaly, no splenomegaly EXT:  2 + pulses throughout, no edema, no cyanosis no clubbing SKIN:  Warm and dry.  No rashes NEURO:  Alert and oriented x 3. Cranial nerves II through XII intact. PSYCH:  Cognitively intact    Wt Readings from Last 3 Encounters:  05/13/17 245 lb 3.2 oz (111.2 kg)  05/03/17 243 lb (110.2 kg)  11/11/16 239 lb 3.2 oz (108.5 kg)      Studies/Labs Reviewed:   EKG:  EKG is not ordered today.   Recent Labs: No results found for requested labs within last 8760 hours.   Lipid Panel     Component Value Date/Time   CHOL 113 07/25/2014 1029   TRIG 122.0 07/25/2014 1029   HDL 31.90 (L) 07/25/2014 1029   CHOLHDL 4 07/25/2014 1029   VLDL 24.4 07/25/2014 1029   LDLCALC 57 07/25/2014 1029   Labs dated 04/28/17: normal TFTs, CMET, CBC, iron stores. Cholesterol 142, triglycerides 107, LDL 84, HDL 36.   Additional studies/ records that were reviewed today include:    Echo 07/26/2015 LV EF: 30% -   35%  Study Conclusions  - Left ventricle: There is akinesis of the mid anteroseptal,   anterior, apical septal, anterior, inferior walls and of the true   septum. The cavity size was normal. There was mild concentric   hypertrophy. Systolic function was moderately to severely   reduced. The estimated ejection fraction was in the range of 30%   to 35%. Wall motion was normal; there were no regional wall   motion abnormalities. Doppler parameters are consistent with   abnormal left ventricular relaxation (grade 1 diastolic   dysfunction). There was no evidence of elevated ventricular   filling pressure by Doppler parameters. - Aortic valve: Trileaflet; normal thickness leaflets. There was   mild regurgitation. - Ascending aorta: The aortic root and ascending aorta were mildly   dilated measuring 40 mm. - Mitral valve: Calcified annulus. There was no regurgitation. - Left atrium: The atrium was mildly dilated. - Right ventricle: Pacer wire or catheter noted in right ventricle.   Systolic function was normal. - Right atrium: Pacer wire or catheter noted in right atrium. - Tricuspid valve: There was mild regurgitation. - Pulmonic valve: There was no regurgitation. - Pulmonary arteries: Systolic pressure was at the upper limits of   normal. PA peak pressure: 33 mm Hg (S). - Inferior vena cava: The vessel was normal in size.    Myoview 10/14/2016 Study Highlights    The left ventricular ejection fraction is moderately decreased (30-44%).  There was no ST segment deviation  noted during stress.  There is a large defect of moderate severity present in the basal inferior, mid anterior, mid inferoseptal, mid inferior, apical anterior, apical septal, apical inferior, apical lateral and apex location. The defect  is mostly fixed with a small area of reversibility in the inferoseptal region. This is consistent with a large area of scar and mild peri infarct ischemia in the inferoseptal wall.  This is a high risk study due to LV dysfunction and large area of scar with peri infarct ischemia in the inferoseptum.  Nuclear stress EF: 34%.      ASSESSMENT:    1. Chronic systolic heart failure ([place])   2. OSA on CPAP   3. Essential hypertension   4. Cardiomyopathy, ischemic   5. Chronic deep vein thrombosis (DVT) of proximal vein of lower extremity, unspecified laterality ([place])   6. ICD (implantable cardioverter-defibrillator) in place      PLAN:  In order of problems listed above:  1. CAD: s/p multiple PCI, last PCI was performed on 07/06/2014 with successful DES to mid LAD for in stent restenosis. Myoview July 2018  showed stable EF 34%, large infarct consistent with previous MI with mild peri-infarct ischemia. He has stable class 1 angina. We did discuss use of antiplatelet therapy with Coumadin. Explained that we needed to weigh the risk/benefits of continued therapy. Since he has 2 layers of stent in the LAD I would favor continued use of Plavix with coumadin as long as he doesn't have any bleeding problems. He is agreeable to this.   2. Chronic DVT with positive antiphospholipid antibody: On Coumadin  3. Chronic systolic heart failure: Ejection fraction 30-35%. Stable on recent stress test. Euvolemic on physical exam. Titration of medication has been difficult due to low BP and orthostasis. We will refer to Pharm D to see if Delene Loll is affordable and whether he could tolerate with BP. Would need wash out of lisinopril.  4. Ischemic cardiomyopathy s/p ICD: Last  interrogation on 11/04/2016, device functioning normally  5. Hypertension: Blood pressure stable, continue on carvedilol, eplerenone and lisinopril  6. OSA on CPAP: Managed by Dr. Claiborne Billings  7.   Hypercholesterolemia. LDL 84 on high dose lipitor. Does complain of some chronic leg aches not clearly related to statins. Goal LDL <70. Will ask Pharm D to investigate affordability of PCSK 9 inhibitor.     Medication Adjustments/Labs and Tests Ordered: Current medicines are reviewed at length with the patient today.  Concerns regarding medicines are outlined above.  Medication changes, Labs and Tests ordered today are listed in the Patient Instructions below. Patient Instructions  Continue your current therapy  We will investigate the possibility of injectable cholesterol lowering therapy or Entresto for heart failure with our Pharm D.  I will see you in 6 months     Signed, Peter Martinique, MD  05/13/2017 9:07 AM    [facility] [address], [place], [place]  [address] Phone: [phone]; Fax: [phone]

## 2017-05-12 ENCOUNTER — Encounter: Payer: Self-pay | Admitting: Cardiology

## 2017-05-13 ENCOUNTER — Other Ambulatory Visit: Payer: Self-pay

## 2017-05-13 ENCOUNTER — Ambulatory Visit: Payer: PPO | Admitting: Cardiology

## 2017-05-13 ENCOUNTER — Encounter: Payer: Self-pay | Admitting: Cardiology

## 2017-05-13 VITALS — BP 90/64 | HR 84 | Ht 72.0 in | Wt 245.2 lb

## 2017-05-13 DIAGNOSIS — I255 Ischemic cardiomyopathy: Secondary | ICD-10-CM | POA: Diagnosis not present

## 2017-05-13 DIAGNOSIS — Z9989 Dependence on other enabling machines and devices: Secondary | ICD-10-CM | POA: Diagnosis not present

## 2017-05-13 DIAGNOSIS — I5022 Chronic systolic (congestive) heart failure: Secondary | ICD-10-CM

## 2017-05-13 DIAGNOSIS — I825Y9 Chronic embolism and thrombosis of unspecified deep veins of unspecified proximal lower extremity: Secondary | ICD-10-CM | POA: Diagnosis not present

## 2017-05-13 DIAGNOSIS — G4733 Obstructive sleep apnea (adult) (pediatric): Secondary | ICD-10-CM

## 2017-05-13 DIAGNOSIS — Z95810 Presence of automatic (implantable) cardiac defibrillator: Secondary | ICD-10-CM | POA: Diagnosis not present

## 2017-05-13 DIAGNOSIS — I10 Essential (primary) hypertension: Secondary | ICD-10-CM

## 2017-05-13 MED ORDER — LISINOPRIL 2.5 MG PO TABS: 2.5000 mg | ORAL_TABLET | Freq: Every day | ORAL | 3 refills | Status: DC

## 2017-05-13 NOTE — Patient Instructions (Addendum)
Continue your current therapy  We will investigate the possibility of injectable cholesterol lowering therapy or Entresto for heart failure with our Pharm D.  I will see you in 6 months

## 2017-05-17 DIAGNOSIS — H2511 Age-related nuclear cataract, right eye: Secondary | ICD-10-CM | POA: Diagnosis not present

## 2017-05-20 ENCOUNTER — Ambulatory Visit: Payer: PPO | Admitting: Pharmacist

## 2017-05-20 VITALS — BP 118/74 | HR 88

## 2017-05-20 DIAGNOSIS — E785 Hyperlipidemia, unspecified: Secondary | ICD-10-CM | POA: Diagnosis not present

## 2017-05-20 DIAGNOSIS — I5022 Chronic systolic (congestive) heart failure: Secondary | ICD-10-CM | POA: Diagnosis not present

## 2017-05-20 MED ORDER — SACUBITRIL-VALSARTAN 24-26 MG PO TABS: 1.0000 | ORAL_TABLET | Freq: Two times a day (BID) | ORAL | 1 refills | Status: DC

## 2017-05-20 NOTE — Patient Instructions (Addendum)
Return for a  follow up appointment as needed  Check your blood pressure at home daily (if able) and keep record of the readings.  Take your  meds as follows:  . IF ENTRESTO 24/26 MG APPROVED AND ABLE TO AFFORD o STOP TAKING LISINOPRIL o TAKE 1ST DOSE OF ENTRESTO 36 HOURS AFTER LAST LISINOPRIL DOSE o DECREASE FUROSEMIDE TO 1/2 TABLET DAILY o REPEAT BLOOD WORK 2 WEEKS AFTER INITIATING ENTRESTO o CONTINUE TO MONITOR BLOOD PRESSURE DAILY  Contact clinic if you suddenly gain more than 2-3 lb (0.9-1.4 kg) in a day, or more than 5 lb (2.3 kg) in one week.

## 2017-05-20 NOTE — Progress Notes (Signed)
Patient ID: Kevin Hart                 DOB: March 18, 1946                      MRN: [id number]     HPI: Kevin Hart is a 72 y.o. male referred by Dr. Martinique to pharmacist clinic.  PMH includes CAP s/p PCI, recurrent VTE, Lupus anticoagulant, hyperlipidemia, hypertension, heart failure with EF 25-30%, hypertension, ICD placement, and COPD. Patient present today for potential initiation of Entresto and/or PCSK9 inihibitors. His major concern if financial burden with new therapy. He denies problems with dizziness, chest pain, light-headedness or swelling. Reports home BP readings around 841 systolic since lisinopril dose was cut in half.   Current HTN meds:  Carvedilol 6.25mg  twice daily eplerenone 25mg  daily Furosemide 40mg  daily Lisinopril 2.5mg  daily  Current Lipid meds: Atorvastatin 80mg  daily  Intolerance: none  BP goal: <130/80  LDL goal: 70mg /dL  Family History: The patient's family history includes Aneurysm in his father.  Social History: denies tobacco or alcohol use  Home BP readings: none available for assessment  Relevant blood work: 04/28/17 (PCP office): CMET  WNL; CHO 142; TG 107; HDL 36; LDL 84 (atorvastatin 80mg )  Wt Readings from Last 3 Encounters:  05/13/17 245 lb 3.2 oz (111.2 kg)  05/03/17 243 lb (110.2 kg)  11/11/16 239 lb 3.2 oz (108.5 kg)   BP Readings from Last 3 Encounters:  05/20/17 118/74  05/13/17 90/64  05/03/17 108/70   Pulse Readings from Last 3 Encounters:  05/20/17 88  05/13/17 84  05/03/17 75    Past Medical History:  Diagnosis Date  . Arthritis   . BPH (benign prostatic hyperplasia)    takes Flomax daily  . CAD (coronary artery disease)    a. prior LAD stenting;  b. 06/2014 NSTEMI/PCI: LM nl, LAD 30p, 89m ISR (3.0x20 Promus DES), LCX nl, RCA nondom, nl, EF 25-30% ant, apical, dist inf AK.  . Cataracts, bilateral    immature  . Chronic systolic CHF (congestive heart failure) ([place]) 12/31/2014  . DVT (deep venous thrombosis)  (HCC)    RECURRENT left leg  . Dyslipidemia   . GERD (gastroesophageal reflux disease)   . Gout   . History of kidney stones   . Hypertension   . Ischemic cardiomyopathy 07/06/2014  . Lupus anticoagulant positive   . S/P drug eluting coronary stent placement, 07/06/14, Promus 07/07/2014  . Sleep apnea   . Subarachnoid bleed ([place]) 2015   after falling and hitting his head    Current Outpatient Medications on File Prior to Visit  Medication Sig Dispense Refill  . allopurinol (ZYLOPRIM) 300 MG tablet Take 1 tablet by mouth daily.    Marland Kitchen atorvastatin (LIPITOR) 80 MG tablet Take 1 tablet (80 mg total) by mouth daily at 6 PM. 30 tablet 6  . carvedilol (COREG) 6.25 MG tablet Take 6.25 mg by mouth 2 (two) times daily with a meal.    . cholecalciferol (VITAMIN D) 1000 units tablet Take 2,000 Units by mouth daily.    . clopidogrel (PLAVIX) 75 MG tablet Take 75 mg by mouth daily.    Marland Kitchen eplerenone (INSPRA) 25 MG tablet Take 25 mg by mouth daily.    . furosemide (LASIX) 40 MG tablet Take 40 mg by mouth daily.      Marland Kitchen lisinopril (PRINIVIL,ZESTRIL) 2.5 MG tablet Take 1 tablet (2.5 mg total) by mouth daily. 90 tablet 3  .  Multiple Vitamin (MULTI-VITAMINS) TABS Take 1 tablet by mouth daily.     . pantoprazole (PROTONIX) 40 MG tablet Take 1 tablet (40 mg total) by mouth daily. 30 tablet 2  . Tamsulosin HCl (FLOMAX) 0.4 MG CAPS Take 0.4 mg by mouth daily.     Marland Kitchen warfarin (COUMADIN) 5 MG tablet Take 1 tablet (5 mg total) by mouth daily. 30 tablet 0  . Ascorbic Acid (VITAMIN C) 1000 MG tablet Take 1,000 mg by mouth daily.    . nitroGLYCERIN (NITROSTAT) 0.4 MG SL tablet Place 1 tablet (0.4 mg total) under the tongue every 5 (five) minutes x 3 doses as needed for chest pain. (Patient not taking: Reported on 05/20/2017) 25 tablet 11   No current facility-administered medications on file prior to visit.     Allergies  Allergen Reactions  . No Known Allergies     Blood pressure 118/74, pulse 88, SpO2 96  %.  Chronic systolic CHF (congestive heart failure) (HCC) Blood pressure and renal function are appropriate for conversion to low dose Entresto. Patient agrees with change in medication but remains concened about potential financial burden. Options of patient assistance and PAN foundation were discussed with patient. Rx was sent to prefer pharmacy to obtain co-pay information. If Entresto 24/26 is affordable to patient he will: decrease furosemide to 1/2 tablet daily, stop taking lisinopril, start Entresto 24/26 after 36 hours washout period, monitor BP twice daily, and repeat BMET in 2 weeks.   All instructions were given to patient in writing. Phone number provided for patient to call if questions or concerns. I plan to follow up with pharmacy to verify if/when patient pick-up prescription, then schedule a follow appointment for titration if needed.  Dyslipidemia Patient reports some leg pain but not sure if related to atorvastatin. He has been taking it for > 10 years without concerns. Patient preference if to complete process for ENTRESTO, then will consider change to PCSK9i.  Will continue atorvastatin 80mg  daily and lifestyle modifications. Plan to re-visit initiating Repatha/Praluent or additing ezetimibe during follow up appointment.    Kevin Hart PharmD, BCPS, [place] [id number] 05/21/2017 7:55 AM

## 2017-05-21 ENCOUNTER — Encounter: Payer: Self-pay | Admitting: Pharmacist

## 2017-05-21 LAB — CUP PACEART REMOTE DEVICE CHECK
Battery Voltage: 3.01 V
Brady Statistic RV Percent Paced: 1 %
HighPow Impedance: 75 Ohm
HighPow Impedance: 75 Ohm
Implantable Lead Location: 753860
Lead Channel Pacing Threshold Amplitude: 0.75 V
Lead Channel Pacing Threshold Pulse Width: 0.5 ms
Lead Channel Sensing Intrinsic Amplitude: 11.8 mV
Lead Channel Setting Pacing Amplitude: 2.5 V
Lead Channel Setting Sensing Sensitivity: 0.5 mV
MDC IDC LEAD IMPLANT DT: 20160906
MDC IDC MSMT BATTERY REMAINING LONGEVITY: 83 mo
MDC IDC MSMT BATTERY REMAINING PERCENTAGE: 76 %
MDC IDC MSMT LEADCHNL RV IMPEDANCE VALUE: 510 Ohm
MDC IDC PG IMPLANT DT: 20160906
MDC IDC SESS DTM: 20190214182942
MDC IDC SET LEADCHNL RV PACING PULSEWIDTH: 0.5 ms
Pulse Gen Serial Number: 7257239

## 2017-05-21 NOTE — Assessment & Plan Note (Signed)
Patient reports some leg pain but not sure if related to atorvastatin. He has been taking it for > 10 years without concerns. Patient preference if to complete process for ENTRESTO, then will consider change to PCSK9i.  Will continue atorvastatin 80mg  daily and lifestyle modifications. Plan to re-visit initiating Repatha/Praluent or additing ezetimibe during follow up appointment.

## 2017-05-21 NOTE — Assessment & Plan Note (Signed)
Blood pressure and renal function are appropriate for conversion to low dose Entresto. Patient agrees with change in medication but remains concened about potential financial burden. Options of patient assistance and PAN foundation were discussed with patient. Rx was sent to prefer pharmacy to obtain co-pay information. If Entresto 24/26 is affordable to patient he will: decrease furosemide to 1/2 tablet daily, stop taking lisinopril, start Entresto 24/26 after 36 hours washout period, monitor BP twice daily, and repeat BMET in 2 weeks.   All instructions were given to patient in writing. Phone number provided for patient to call if questions or concerns. I plan to follow up with pharmacy to verify if/when patient pick-up prescription, then schedule a follow appointment for titration if needed.

## 2017-05-26 DIAGNOSIS — L820 Inflamed seborrheic keratosis: Secondary | ICD-10-CM | POA: Diagnosis not present

## 2017-05-26 DIAGNOSIS — Z85828 Personal history of other malignant neoplasm of skin: Secondary | ICD-10-CM | POA: Diagnosis not present

## 2017-05-26 DIAGNOSIS — L570 Actinic keratosis: Secondary | ICD-10-CM | POA: Diagnosis not present

## 2017-05-26 DIAGNOSIS — Z08 Encounter for follow-up examination after completed treatment for malignant neoplasm: Secondary | ICD-10-CM | POA: Diagnosis not present

## 2017-05-30 DIAGNOSIS — G4733 Obstructive sleep apnea (adult) (pediatric): Secondary | ICD-10-CM | POA: Diagnosis not present

## 2017-06-01 ENCOUNTER — Telehealth: Payer: Self-pay | Admitting: Cardiology

## 2017-06-01 NOTE — Telephone Encounter (Signed)
Spoke to [place] with [employer] Prior Authorization completed for [employer]

## 2017-06-01 NOTE — Telephone Encounter (Signed)
New message     Gregary Signs from [employer] calling Ref key # [id number]   Pt c/o medication issue:  1. Name of Medication: sacubitril-valsartan (ENTRESTO) 24-26 MG  2. How are you currently taking this medication (dosage and times per day)? Take 1 tablet by mouth 2 (two) times daily. To replace lisinopril (start 36 hours after last lisinopril dose)  3. Are you having a reaction (difficulty breathing--STAT)? no  4. What is your medication issue? Has auth be obtained

## 2017-06-02 ENCOUNTER — Telehealth: Payer: Self-pay | Admitting: Cardiology

## 2017-06-02 NOTE — Telephone Encounter (Signed)
New Message   Pt c/o medication issue:  1. Name of Medication: Entresto   2. How are you currently taking this medication (dosage and times per day)? 24-26mg  1 tablet by mouth 2 times a day  3. Are you having a reaction (difficulty breathing--STAT)? none  4. What is your medication issue? Apolonio Schneiders from [employer] is calling in reference to medication. Medication requires a prior authorization. Please refer [id number] when calling.

## 2017-06-03 ENCOUNTER — Telehealth: Payer: Self-pay

## 2017-06-03 ENCOUNTER — Ambulatory Visit (INDEPENDENT_AMBULATORY_CARE_PROVIDER_SITE_OTHER): Payer: PPO

## 2017-06-03 DIAGNOSIS — I5022 Chronic systolic (congestive) heart failure: Secondary | ICD-10-CM | POA: Diagnosis not present

## 2017-06-03 DIAGNOSIS — Z95810 Presence of automatic (implantable) cardiac defibrillator: Secondary | ICD-10-CM

## 2017-06-03 NOTE — Telephone Encounter (Signed)
Spoke to [place] with [employer] we received form you faxed.Dr.Jordan is out of office today.I will have completed,signed and faxed back tomorrow.

## 2017-06-03 NOTE — Progress Notes (Signed)
EPIC Encounter for ICM Monitoring  Patient Name: Kevin Hart is a 72 y.o. male Date: 06/03/2017 Primary Care Physican: Guadlupe Spanish, MD Primary [place] Electrophysiologist: Allred Dry Weight:Previous weight238lbs      Attempted call to patient and unable to reach.  Left detailed message regarding transmission.  Transmission reviewed.    Thoracic impedance normal but was abnormal suggesting fluid accumulation from 05/28/2017 to 06/01/2017.  Prescribed dosage: Furosemide 40 mg 1 tablet daily.   Labs: 12/14/2017Creatinine 0.98, BUN 15, Potassium 3.7, Sodium 140, EGFR >60 11/28/2015 Creatinine 1.39, BUN 29, Potassium 3.8, Sodium 136, EGFR 49-57  10/17/2015 Creatinine 1.09, BUN 15, Potassium 4.1, Sodium 140  Recommendations: Left voice mail with ICM number and encouraged to call if experiencing any fluid symptoms.  Follow-up plan: ICM clinic phone appointment on 07/05/2017.   Copy of ICM check sent to Dr. Rayann Heman.   3 month ICM trend: 06/03/2017    1 Year ICM trend:       Rosalene Billings, RN 06/03/2017 1:45 PM

## 2017-06-03 NOTE — Telephone Encounter (Signed)
Remote ICM transmission received.  Attempted call to patient and left detailed message per DPR regarding transmission and next ICM scheduled for 07/05/2017.  Advised to return call for any fluid symptoms or questions.    

## 2017-06-04 NOTE — Telephone Encounter (Signed)
[employer] coverage determination request form for Entresto completed and faxed to fax # [phone].

## 2017-06-16 DIAGNOSIS — Z7901 Long term (current) use of anticoagulants: Secondary | ICD-10-CM | POA: Diagnosis not present

## 2017-06-28 ENCOUNTER — Telehealth: Payer: Self-pay | Admitting: Pharmacist

## 2017-06-28 NOTE — Telephone Encounter (Signed)
Patient received Entresto and expressed no problems with co-pay but has not started medication yet.   He lost the instructions provided during last OV and is not sure about how to take it. He is also out of town and is not able to start [place] this week either.  Instructions given to start once back home:  1. STOP lisinopril  2. Wait 36 hours since last lisinopril dose, then START ENTRESTO 24/26 twice daily  3. DECREASE furosemide dose to 1/2 tablets daily once taking ENTRESTO  4. REPEAT BMET in 2 weeks and call clinic at [phone] if questions of problems

## 2017-06-30 DIAGNOSIS — G4733 Obstructive sleep apnea (adult) (pediatric): Secondary | ICD-10-CM | POA: Diagnosis not present

## 2017-07-05 ENCOUNTER — Ambulatory Visit (INDEPENDENT_AMBULATORY_CARE_PROVIDER_SITE_OTHER): Payer: PPO

## 2017-07-05 DIAGNOSIS — I5022 Chronic systolic (congestive) heart failure: Secondary | ICD-10-CM | POA: Diagnosis not present

## 2017-07-05 DIAGNOSIS — Z95810 Presence of automatic (implantable) cardiac defibrillator: Secondary | ICD-10-CM | POA: Diagnosis not present

## 2017-07-05 NOTE — Progress Notes (Signed)
EPIC Encounter for ICM Monitoring  Patient Name: Kevin Hart is a 72 y.o. male Date: 07/05/2017 Primary Care Physican: Guadlupe Spanish, MD Primary [place] Electrophysiologist: Allred Dry Weight:242lbs           Heart Failure questions reviewed, pt weight has increased by 2-3 pounds in last week.  He started Entresto on 06/28/2017 and was instructed to decrease Furosemide to 20 mg daily.  Impedance is showing fluid accumulation starting after Furosemide was decreased.    Thoracic impedance abnormal suggesting fluid accumulation since 07/03/2017.  Prescribed dosage: Furosemide 40 mg 1 tablet daily.   Labs: 04/28/2017 Creatinine 1.3,   BUN 19, Potassium 3.9, Sodium 140 12/14/2017Creatinine 0.98, BUN 15, Potassium 3.7, Sodium 140, EGFR >60 11/28/2015 Creatinine 1.39, BUN 29, Potassium 3.8, Sodium 136, EGFR 49-57  10/17/2015 Creatinine 1.09, BUN 15, Potassium 4.1, Sodium 140  Recommendations:  Copy of ICM check sent to Dr. Martinique and Dr. Rayann Heman for recommendations since patient was instructed last week to decrease Lasix.   Advised to limit salt intake and to call back if weight continues to increase.   Follow-up plan: ICM clinic phone appointment on 07/13/2017 to recheck fluid levels.    3 month ICM trend: 07/05/2017    1 Year ICM trend:       Rosalene Billings, RN 07/05/2017 8:12 AM

## 2017-07-13 ENCOUNTER — Ambulatory Visit (INDEPENDENT_AMBULATORY_CARE_PROVIDER_SITE_OTHER): Payer: Self-pay

## 2017-07-13 DIAGNOSIS — I5022 Chronic systolic (congestive) heart failure: Secondary | ICD-10-CM

## 2017-07-13 DIAGNOSIS — Z95810 Presence of automatic (implantable) cardiac defibrillator: Secondary | ICD-10-CM

## 2017-07-13 NOTE — Progress Notes (Signed)
EPIC Encounter for ICM Monitoring  Patient Name: Kevin Hart is a 72 y.o. male Date: 07/13/2017 Primary Care Physican: Guadlupe Spanish, MD Primary [place] Electrophysiologist: Allred Dry Weight:245lbs       Pt symptomatic with weight gain and swollen fingers. Highest weight in last week was 249 lbs which is ~7 lbs above baseline (240-242 lbs).  Todays weight is 245 lbs.  He was out of town for about 5 days and ate at restaurants more than usual.   Thoracic impedance returned to normal since 07/05/2017 ICM transmission but appears to starting to trend below baseline suggesting fluid may be accumulating again.  Prescribed dosage:  Furosemide 40 mg 1 tablet daily.   Patient instructed by Raquel Rodriguez-Guzman, RPH to decrease Furosemide to 20 mg daily because he started Entresto.    Labs: 04/28/2017 Creatinine 1.3,   BUN 19, Potassium 3.9, Sodium 140 12/14/2017Creatinine 0.98, BUN 15, Potassium 3.7, Sodium 140, EGFR >60 11/28/2015 Creatinine 1.39, BUN 29, Potassium 3.8, Sodium 136, EGFR 49-57  10/17/2015 Creatinine 1.09, BUN 15, Potassium 4.1, Sodium 140  Recommendations: Copy of ICM check sent to Dr. Rayann Heman and Dr Martinique for review and recommendations.    Follow-up plan: ICM clinic phone appointment on 08/05/2017.   Copy of ICM check sent to Dr. Rayann Heman and Dr Martinique.   3 month ICM trend: 07/13/2017    1 Year ICM trend:       Rosalene Billings, RN 07/13/2017 9:28 AM

## 2017-07-16 DIAGNOSIS — E7800 Pure hypercholesterolemia, unspecified: Secondary | ICD-10-CM | POA: Diagnosis not present

## 2017-07-16 DIAGNOSIS — I10 Essential (primary) hypertension: Secondary | ICD-10-CM | POA: Diagnosis not present

## 2017-07-16 DIAGNOSIS — M25572 Pain in left ankle and joints of left foot: Secondary | ICD-10-CM | POA: Diagnosis not present

## 2017-07-16 DIAGNOSIS — I82409 Acute embolism and thrombosis of unspecified deep veins of unspecified lower extremity: Secondary | ICD-10-CM | POA: Diagnosis not present

## 2017-07-16 DIAGNOSIS — Z7901 Long term (current) use of anticoagulants: Secondary | ICD-10-CM | POA: Diagnosis not present

## 2017-07-16 NOTE — Progress Notes (Signed)
Call to patient for follow up.  Patient reported his weight is still up and at 246 lbs which is about 3-4 lbs above baseline. Fingers are still swollen.  He attempted to send me a manual transmission but was not successful.  Advised how to send transmission and will send one when he gets home later today.

## 2017-07-16 NOTE — Progress Notes (Addendum)
Call back to patient.  Advised I discussed with Dr Jackalyn Lombard NP, Chanetta Marshall. She said patient can increase Furosemide to 40 mg x 2 days and then return to prescribed dosage of 20 mg.  BMET to be drawn on Monday, 07/19/2017 at [facility] office.  Patient will send manual transmission on 07/19/2017 for review.

## 2017-07-19 ENCOUNTER — Ambulatory Visit (INDEPENDENT_AMBULATORY_CARE_PROVIDER_SITE_OTHER): Payer: Self-pay

## 2017-07-19 ENCOUNTER — Telehealth: Payer: Self-pay | Admitting: *Deleted

## 2017-07-19 ENCOUNTER — Telehealth: Payer: Self-pay

## 2017-07-19 ENCOUNTER — Other Ambulatory Visit: Payer: Self-pay | Admitting: Cardiology

## 2017-07-19 DIAGNOSIS — I5022 Chronic systolic (congestive) heart failure: Secondary | ICD-10-CM | POA: Diagnosis not present

## 2017-07-19 DIAGNOSIS — Z95810 Presence of automatic (implantable) cardiac defibrillator: Secondary | ICD-10-CM

## 2017-07-19 NOTE — Telephone Encounter (Signed)
Call to patient.  Advised Dr Martinique recommended he return to Furosemide 40 mg daily and to stay on low salted diet.  Patient verbalized understanding.  He is on the way to get his blood drawn that was ordered by Chanetta Marshall, NP on 4/26.  Requested he send remote transmission when he returns home today to recheck fluid levels.

## 2017-07-19 NOTE — Telephone Encounter (Signed)
REFILL 

## 2017-07-19 NOTE — Telephone Encounter (Signed)
-----   Message from Peter M Martinique, MD sent at 07/16/2017 12:28 PM EDT ----- Regarding: RE: Corvue and weight gain I would increase back to 40 mg and tell him to eat better.  Peter Martinique MD, [facility]  ----- Message ----- From: Rosalene Billings, RN Sent: 07/15/2017  10:49 AM To: Peter M Martinique, MD Subject: Jeannine Kitten and weight gain                         Hi Dr Martinique,   Patient has 7 lb weight gain in last week and Corvue has shown some fluid accumulation.    He is taking Furosemide 20 mg daily (it was decreased from 40 mg on 4/8 by pharmacist when he started [place]).    He has been on vacation and not following low salt diet.    Do you want to make any adjustments to Furosemide dosage or leave it at 20 mg daily?  I cc'd my note to you for review if needed.  Thanks, Sharman Cheek, RN ICM

## 2017-07-19 NOTE — Telephone Encounter (Signed)
BMET order has been placed per request of lab tech. She was unable to access the prior order.

## 2017-07-20 LAB — BASIC METABOLIC PANEL
BUN/Creatinine Ratio: 15 (ref 10–24)
BUN: 17 mg/dL (ref 8–27)
CO2: 23 mmol/L (ref 20–29)
CREATININE: 1.12 mg/dL (ref 0.76–1.27)
Calcium: 9.3 mg/dL (ref 8.6–10.2)
Chloride: 106 mmol/L (ref 96–106)
GFR calc Af Amer: 75 mL/min/{1.73_m2} (ref >59)
GFR, EST NON AFRICAN AMERICAN: 65 mL/min/{1.73_m2} (ref >59)
GLUCOSE: 104 mg/dL — AB (ref 65–99)
Potassium: 4 mmol/L (ref 3.5–5.2)
Sodium: 144 mmol/L (ref 134–144)

## 2017-07-20 NOTE — Progress Notes (Signed)
EPIC Encounter for ICM Monitoring  Patient Name: Kevin Hart is a 72 y.o. male Date: 07/20/2017 Primary Care Physican: Guadlupe Spanish, MD Primary [place] Electrophysiologist: Allred Dry Weight:245lbs      Heart Failure questions reviewed, pt reported weight remains about the same which is several pounds higher than baseline.  He only took Furosemide 20 mg today instead of 40 mg.    Thoracic impedance continues to be abnormal suggesting fluid accumulation since 07/14/2017 even after resuming Furosemide 40 mg daily as instructed by Dr Martinique. (Pharmacist had reduced to 20 mg daily on 4/8).  Prescribed dosage: Furosemide 40 mg 1 tablet daily.   Patient instructed by Raquel Rodriguez-Guzman, RPH to decrease Furosemide to 20 mg daily because he started Entresto.    Labs: 04/28/2017 Creatinine 1.3, BUN 19, Potassium 3.9, Sodium 140 12/14/2017Creatinine 0.98, BUN 15, Potassium 3.7, Sodium 140, EGFR >60 11/28/2015 Creatinine 1.39, BUN 29, Potassium 3.8, Sodium 136, EGFR 49-57  10/17/2015 Creatinine 1.09, BUN 15, Potassium 4.1, Sodium 140  Recommendations: No changes.  Reinforced sodium restriction.  Encouraged to call for fluid symptoms.  Follow-up plan: ICM clinic phone appointment on 07/23/2017 to recheck fluid levels.    Copy of ICM check sent to Dr. Rayann Heman and Dr. Martinique.   3 month ICM trend: 07/20/2017    1 Year ICM trend:       Rosalene Billings, RN 07/20/2017 12:23 PM

## 2017-07-23 ENCOUNTER — Ambulatory Visit (INDEPENDENT_AMBULATORY_CARE_PROVIDER_SITE_OTHER): Payer: Self-pay

## 2017-07-23 DIAGNOSIS — I5022 Chronic systolic (congestive) heart failure: Secondary | ICD-10-CM

## 2017-07-23 DIAGNOSIS — Z95810 Presence of automatic (implantable) cardiac defibrillator: Secondary | ICD-10-CM

## 2017-07-26 NOTE — Progress Notes (Signed)
EPIC Encounter for ICM Monitoring  Patient Name: Kevin Hart is a 73 y.o. male Date: 07/26/2017 Primary Care Physican: Guadlupe Spanish, MD Primary [place] Electrophysiologist: Allred Dry Weight:244lbs(baseline 235-240 lbs)       Heart Failure questions reviewed, pt asymptomatic. If he has fluid symptoms it is usually his ring will be tight on ring finger.     Thoracic impedance returned to normal after Lasix maintenance dose returned to 40 mg daily.  Thoracic impedance was abnormal suggesting fluid accumulation from 07/14/2017 - 07/22/2017.  Prescribed dosage: Furosemide 40 mg 1 tablet daily.  Labs: 07/19/2017 Creatinine 1.12, BUN 17, Potassium 4.0, Sodium 144, EGFR 65-75 04/28/2017 Creatinine 1.3, BUN 19, Potassium 3.9, Sodium 140  Recommendations: No changes.  Encouraged to call for fluid symptoms.  Follow-up plan: ICM clinic phone appointment on 08/26/2017.   Copy of ICM check sent to Dr. Rayann Heman and Dr Martinique.   3 month ICM trend: 07/26/2017    1 Year ICM trend:       Rosalene Billings, RN 07/26/2017 10:01 AM

## 2017-07-30 DIAGNOSIS — G4733 Obstructive sleep apnea (adult) (pediatric): Secondary | ICD-10-CM | POA: Diagnosis not present

## 2017-08-18 ENCOUNTER — Other Ambulatory Visit: Payer: Self-pay | Admitting: *Deleted

## 2017-08-18 ENCOUNTER — Ambulatory Visit (HOSPITAL_COMMUNITY)
Admission: RE | Admit: 2017-08-18 | Discharge: 2017-08-18 | Disposition: A | Discharge status: Home / Self Care | Payer: PPO | Source: Ambulatory Visit | Attending: Internal Medicine | Admitting: Internal Medicine

## 2017-08-18 ENCOUNTER — Encounter: Payer: PPO | Admitting: *Deleted

## 2017-08-18 DIAGNOSIS — Z006 Encounter for examination for normal comparison and control in clinical research program: Secondary | ICD-10-CM

## 2017-08-18 DIAGNOSIS — I351 Nonrheumatic aortic (valve) insufficiency: Secondary | ICD-10-CM

## 2017-08-18 DIAGNOSIS — I5022 Chronic systolic (congestive) heart failure: Secondary | ICD-10-CM

## 2017-08-18 NOTE — Progress Notes (Signed)
  Echocardiogram 2D Echocardiogram has been performed.  Kevin Hart F 08/18/2017, 1:10 PM

## 2017-08-25 DIAGNOSIS — M129 Arthropathy, unspecified: Secondary | ICD-10-CM | POA: Diagnosis not present

## 2017-08-25 DIAGNOSIS — I2510 Atherosclerotic heart disease of native coronary artery without angina pectoris: Secondary | ICD-10-CM | POA: Diagnosis not present

## 2017-08-25 DIAGNOSIS — Z7901 Long term (current) use of anticoagulants: Secondary | ICD-10-CM | POA: Diagnosis not present

## 2017-08-25 DIAGNOSIS — M545 Low back pain: Secondary | ICD-10-CM | POA: Diagnosis not present

## 2017-08-25 DIAGNOSIS — Z125 Encounter for screening for malignant neoplasm of prostate: Secondary | ICD-10-CM | POA: Diagnosis not present

## 2017-08-25 DIAGNOSIS — I10 Essential (primary) hypertension: Secondary | ICD-10-CM | POA: Diagnosis not present

## 2017-08-25 DIAGNOSIS — E039 Hypothyroidism, unspecified: Secondary | ICD-10-CM | POA: Diagnosis not present

## 2017-08-25 DIAGNOSIS — E559 Vitamin D deficiency, unspecified: Secondary | ICD-10-CM | POA: Diagnosis not present

## 2017-08-26 ENCOUNTER — Telehealth: Payer: Self-pay | Admitting: Cardiology

## 2017-08-26 ENCOUNTER — Ambulatory Visit (INDEPENDENT_AMBULATORY_CARE_PROVIDER_SITE_OTHER): Payer: PPO | Admitting: *Deleted

## 2017-08-26 DIAGNOSIS — I255 Ischemic cardiomyopathy: Secondary | ICD-10-CM

## 2017-08-26 DIAGNOSIS — I5022 Chronic systolic (congestive) heart failure: Secondary | ICD-10-CM

## 2017-08-26 DIAGNOSIS — Z95810 Presence of automatic (implantable) cardiac defibrillator: Secondary | ICD-10-CM

## 2017-08-26 NOTE — Telephone Encounter (Signed)
Spoke with pt and reminded pt of remote transmission that is due today. Pt verbalized understanding.   

## 2017-08-27 ENCOUNTER — Encounter: Payer: Self-pay | Admitting: Cardiology

## 2017-08-27 NOTE — Progress Notes (Signed)
EPIC Encounter for ICM Monitoring  Patient Name: Kevin Hart is a 72 y.o. male Date: 08/27/2017 Primary Care Physican: Guadlupe Spanish, MD Primary [place] Electrophysiologist: Allred Dry Weight:245lbs(baseline 235-240 lbs)       Heart Failure questions reviewed, pt asymptomatic.   Thoracic impedance normal.  Prescribed dosage: Furosemide 40 mg 1 tablet daily.  Labs: 07/19/2017 Creatinine 1.12, BUN 17, Potassium 4.0, Sodium 144, EGFR 65-75 04/28/2017 Creatinine 1.3, BUN 19, Potassium 3.9, Sodium 140  Recommendations: No changes.   Encouraged to call for fluid symptoms.  Follow-up plan: ICM clinic phone appointment on 09/30/2017.  Recall appt 11/09/17 with Dr. Martinique and 05/03/2018 with Chanetta Marshall, NP.  Copy of ICM check sent to Dr. Rayann Heman.   3 month ICM trend: 08/27/2017    1 Year ICM trend:       Rosalene Billings, RN 08/27/2017 1:38 PM

## 2017-08-27 NOTE — Progress Notes (Signed)
Remote ICD transmission.   

## 2017-08-30 DIAGNOSIS — G4733 Obstructive sleep apnea (adult) (pediatric): Secondary | ICD-10-CM | POA: Diagnosis not present

## 2017-09-20 DIAGNOSIS — I82409 Acute embolism and thrombosis of unspecified deep veins of unspecified lower extremity: Secondary | ICD-10-CM | POA: Diagnosis not present

## 2017-09-20 DIAGNOSIS — I509 Heart failure, unspecified: Secondary | ICD-10-CM | POA: Diagnosis not present

## 2017-09-20 DIAGNOSIS — I10 Essential (primary) hypertension: Secondary | ICD-10-CM | POA: Diagnosis not present

## 2017-09-20 DIAGNOSIS — Z7901 Long term (current) use of anticoagulants: Secondary | ICD-10-CM | POA: Diagnosis not present

## 2017-09-30 ENCOUNTER — Telehealth: Payer: Self-pay | Admitting: Cardiology

## 2017-09-30 ENCOUNTER — Ambulatory Visit (INDEPENDENT_AMBULATORY_CARE_PROVIDER_SITE_OTHER): Payer: PPO

## 2017-09-30 DIAGNOSIS — I5022 Chronic systolic (congestive) heart failure: Secondary | ICD-10-CM

## 2017-09-30 DIAGNOSIS — Z95810 Presence of automatic (implantable) cardiac defibrillator: Secondary | ICD-10-CM

## 2017-09-30 NOTE — Telephone Encounter (Signed)
Spoke with pt and reminded pt of remote transmission that is due today. Pt verbalized understanding.   

## 2017-10-01 NOTE — Progress Notes (Signed)
EPIC Encounter for ICM Monitoring  Patient Name: Kevin Hart is a 72 y.o. male Date: 10/01/2017 Primary Care Physican: Guadlupe Spanish, MD Primary [place] Electrophysiologist: Allred Dry Weight:245lbs(baseline235-240 lbs)       Heart Failure questions reviewed, pt asymptomatic.   Thoracic impedance normal but was abnormal suggesting fluid accumulation from 09/13/2017 - 09/19/2017.  Prescribed dosage: Furosemide 40 mg 1 tablet daily.  Labs: 07/19/2017 Creatinine 1.12, BUN 17, Potassium 4.0, Sodium 144, EGFR 65-75 04/28/2017 Creatinine 1.3, BUN 19, Potassium 3.9, Sodium 140  Recommendations: No changes.   Encouraged to call for fluid symptoms.  Follow-up plan: ICM clinic phone appointment on 11/04/2017.   Recall appt 11/09/17 with Dr. Martinique and 05/03/2018 with Chanetta Marshall, NP.  Copy of ICM check sent to Dr. Rayann Heman.   3 month ICM trend: 10/01/2017    1 Year ICM trend:       Rosalene Billings, RN 10/01/2017 5:23 PM

## 2017-10-19 LAB — CUP PACEART REMOTE DEVICE CHECK
Battery Remaining Longevity: 79 mo
Battery Remaining Percentage: 73 %
Battery Voltage: 3.01 V
Brady Statistic RV Percent Paced: 1 %
HIGH POWER IMPEDANCE MEASURED VALUE: 75 Ohm
HIGH POWER IMPEDANCE MEASURED VALUE: 75 Ohm
Implantable Lead Implant Date: 20160906
Lead Channel Impedance Value: 480 Ohm
Lead Channel Pacing Threshold Amplitude: 0.75 V
MDC IDC LEAD LOCATION: 753860
MDC IDC MSMT LEADCHNL RV PACING THRESHOLD PULSEWIDTH: 0.5 ms
MDC IDC MSMT LEADCHNL RV SENSING INTR AMPL: 11.8 mV
MDC IDC PG IMPLANT DT: 20160906
MDC IDC PG SERIAL: 7257239
MDC IDC SESS DTM: 20190607153942
MDC IDC SET LEADCHNL RV PACING AMPLITUDE: 2.5 V
MDC IDC SET LEADCHNL RV PACING PULSEWIDTH: 0.5 ms
MDC IDC SET LEADCHNL RV SENSING SENSITIVITY: 0.5 mV

## 2017-10-25 ENCOUNTER — Other Ambulatory Visit: Payer: Self-pay

## 2017-10-25 ENCOUNTER — Inpatient Hospital Stay (HOSPITAL_BASED_OUTPATIENT_CLINIC_OR_DEPARTMENT_OTHER)
Admission: EM | Admit: 2017-10-25 | Discharge: 2017-10-29 | DRG: 698 | Disposition: A | Discharge status: Home / Self Care | Payer: PPO | Attending: Internal Medicine | Admitting: Internal Medicine

## 2017-10-25 ENCOUNTER — Encounter (HOSPITAL_BASED_OUTPATIENT_CLINIC_OR_DEPARTMENT_OTHER): Payer: Self-pay | Admitting: *Deleted

## 2017-10-25 DIAGNOSIS — N281 Cyst of kidney, acquired: Secondary | ICD-10-CM | POA: Diagnosis not present

## 2017-10-25 DIAGNOSIS — G4730 Sleep apnea, unspecified: Secondary | ICD-10-CM | POA: Diagnosis not present

## 2017-10-25 DIAGNOSIS — Z95810 Presence of automatic (implantable) cardiac defibrillator: Secondary | ICD-10-CM

## 2017-10-25 DIAGNOSIS — R112 Nausea with vomiting, unspecified: Secondary | ICD-10-CM | POA: Diagnosis not present

## 2017-10-25 DIAGNOSIS — R319 Hematuria, unspecified: Secondary | ICD-10-CM | POA: Diagnosis not present

## 2017-10-25 DIAGNOSIS — I252 Old myocardial infarction: Secondary | ICD-10-CM | POA: Diagnosis not present

## 2017-10-25 DIAGNOSIS — N401 Enlarged prostate with lower urinary tract symptoms: Secondary | ICD-10-CM | POA: Diagnosis not present

## 2017-10-25 DIAGNOSIS — Z955 Presence of coronary angioplasty implant and graft: Secondary | ICD-10-CM

## 2017-10-25 DIAGNOSIS — I110 Hypertensive heart disease with heart failure: Secondary | ICD-10-CM | POA: Diagnosis present

## 2017-10-25 DIAGNOSIS — Z7901 Long term (current) use of anticoagulants: Secondary | ICD-10-CM | POA: Diagnosis not present

## 2017-10-25 DIAGNOSIS — K219 Gastro-esophageal reflux disease without esophagitis: Secondary | ICD-10-CM | POA: Diagnosis not present

## 2017-10-25 DIAGNOSIS — I2510 Atherosclerotic heart disease of native coronary artery without angina pectoris: Secondary | ICD-10-CM | POA: Diagnosis present

## 2017-10-25 DIAGNOSIS — D6862 Lupus anticoagulant syndrome: Secondary | ICD-10-CM | POA: Diagnosis present

## 2017-10-25 DIAGNOSIS — G8929 Other chronic pain: Secondary | ICD-10-CM | POA: Diagnosis not present

## 2017-10-25 DIAGNOSIS — N390 Urinary tract infection, site not specified: Secondary | ICD-10-CM | POA: Diagnosis not present

## 2017-10-25 DIAGNOSIS — Z86718 Personal history of other venous thrombosis and embolism: Secondary | ICD-10-CM

## 2017-10-25 DIAGNOSIS — N2889 Other specified disorders of kidney and ureter: Secondary | ICD-10-CM | POA: Diagnosis not present

## 2017-10-25 DIAGNOSIS — I255 Ischemic cardiomyopathy: Secondary | ICD-10-CM | POA: Diagnosis present

## 2017-10-25 DIAGNOSIS — K661 Hemoperitoneum: Secondary | ICD-10-CM | POA: Diagnosis not present

## 2017-10-25 DIAGNOSIS — R338 Other retention of urine: Secondary | ICD-10-CM | POA: Diagnosis not present

## 2017-10-25 DIAGNOSIS — M109 Gout, unspecified: Secondary | ICD-10-CM | POA: Diagnosis present

## 2017-10-25 DIAGNOSIS — N1330 Unspecified hydronephrosis: Secondary | ICD-10-CM | POA: Diagnosis present

## 2017-10-25 DIAGNOSIS — I714 Abdominal aortic aneurysm, without rupture: Secondary | ICD-10-CM | POA: Diagnosis not present

## 2017-10-25 DIAGNOSIS — N400 Enlarged prostate without lower urinary tract symptoms: Secondary | ICD-10-CM | POA: Diagnosis not present

## 2017-10-25 DIAGNOSIS — E785 Hyperlipidemia, unspecified: Secondary | ICD-10-CM | POA: Diagnosis not present

## 2017-10-25 DIAGNOSIS — I5022 Chronic systolic (congestive) heart failure: Secondary | ICD-10-CM | POA: Diagnosis not present

## 2017-10-25 DIAGNOSIS — R339 Retention of urine, unspecified: Secondary | ICD-10-CM | POA: Diagnosis not present

## 2017-10-25 DIAGNOSIS — I82409 Acute embolism and thrombosis of unspecified deep veins of unspecified lower extremity: Secondary | ICD-10-CM | POA: Diagnosis not present

## 2017-10-25 DIAGNOSIS — R918 Other nonspecific abnormal finding of lung field: Secondary | ICD-10-CM | POA: Diagnosis not present

## 2017-10-25 DIAGNOSIS — R1031 Right lower quadrant pain: Secondary | ICD-10-CM | POA: Diagnosis not present

## 2017-10-25 DIAGNOSIS — Z7902 Long term (current) use of antithrombotics/antiplatelets: Secondary | ICD-10-CM

## 2017-10-25 DIAGNOSIS — R1110 Vomiting, unspecified: Secondary | ICD-10-CM | POA: Diagnosis not present

## 2017-10-25 DIAGNOSIS — R109 Unspecified abdominal pain: Secondary | ICD-10-CM | POA: Diagnosis not present

## 2017-10-25 DIAGNOSIS — R609 Edema, unspecified: Secondary | ICD-10-CM | POA: Diagnosis not present

## 2017-10-25 HISTORY — DX: Calculus in bladder: N21.0

## 2017-10-25 LAB — CBC
HCT: 42 % (ref 39.0–52.0)
Hemoglobin: 14.7 g/dL (ref 13.0–17.0)
MCH: 31.3 pg (ref 26.0–34.0)
MCHC: 35 g/dL (ref 30.0–36.0)
MCV: 89.6 fL (ref 78.0–100.0)
Platelets: 201 10*3/uL (ref 150–400)
RBC: 4.69 MIL/uL (ref 4.22–5.81)
RDW: 14.9 % (ref 11.5–15.5)
WBC: 12.2 10*3/uL — ABNORMAL HIGH (ref 4.0–10.5)

## 2017-10-25 MED ORDER — ONDANSETRON HCL 4 MG/2ML IJ SOLN
4.0000 mg | Freq: Once | INTRAMUSCULAR | Status: AC
  Administered 2017-10-26: 4 mg via INTRAVENOUS
  Filled 2017-10-25: qty 2

## 2017-10-25 MED ORDER — FENTANYL CITRATE (PF) 100 MCG/2ML IJ SOLN
50.0000 ug | Freq: Once | INTRAMUSCULAR | Status: AC
  Administered 2017-10-26: 50 ug via INTRAVENOUS
  Filled 2017-10-25: qty 2

## 2017-10-25 NOTE — ED Triage Notes (Signed)
C/O right lower abd pain x 1 day, n/v

## 2017-10-26 ENCOUNTER — Inpatient Hospital Stay (HOSPITAL_COMMUNITY)
Admit: 2017-10-26 | Discharge: 2017-10-26 | Disposition: A | Discharge status: Home / Self Care | Payer: PPO | Attending: Family Medicine | Admitting: Family Medicine

## 2017-10-26 ENCOUNTER — Encounter (HOSPITAL_BASED_OUTPATIENT_CLINIC_OR_DEPARTMENT_OTHER): Payer: Self-pay | Admitting: Radiology

## 2017-10-26 ENCOUNTER — Emergency Department (HOSPITAL_BASED_OUTPATIENT_CLINIC_OR_DEPARTMENT_OTHER): Payer: PPO

## 2017-10-26 DIAGNOSIS — N210 Calculus in bladder: Secondary | ICD-10-CM

## 2017-10-26 DIAGNOSIS — M109 Gout, unspecified: Secondary | ICD-10-CM | POA: Diagnosis present

## 2017-10-26 DIAGNOSIS — Z955 Presence of coronary angioplasty implant and graft: Secondary | ICD-10-CM | POA: Diagnosis not present

## 2017-10-26 DIAGNOSIS — N2889 Other specified disorders of kidney and ureter: Secondary | ICD-10-CM | POA: Diagnosis not present

## 2017-10-26 DIAGNOSIS — R339 Retention of urine, unspecified: Secondary | ICD-10-CM | POA: Diagnosis not present

## 2017-10-26 DIAGNOSIS — I255 Ischemic cardiomyopathy: Secondary | ICD-10-CM | POA: Diagnosis present

## 2017-10-26 DIAGNOSIS — G4730 Sleep apnea, unspecified: Secondary | ICD-10-CM | POA: Diagnosis present

## 2017-10-26 DIAGNOSIS — Z86718 Personal history of other venous thrombosis and embolism: Secondary | ICD-10-CM | POA: Diagnosis not present

## 2017-10-26 DIAGNOSIS — R609 Edema, unspecified: Secondary | ICD-10-CM

## 2017-10-26 DIAGNOSIS — K219 Gastro-esophageal reflux disease without esophagitis: Secondary | ICD-10-CM | POA: Diagnosis present

## 2017-10-26 DIAGNOSIS — I714 Abdominal aortic aneurysm, without rupture: Secondary | ICD-10-CM | POA: Diagnosis present

## 2017-10-26 DIAGNOSIS — Z7901 Long term (current) use of anticoagulants: Secondary | ICD-10-CM | POA: Diagnosis not present

## 2017-10-26 DIAGNOSIS — I110 Hypertensive heart disease with heart failure: Secondary | ICD-10-CM | POA: Diagnosis present

## 2017-10-26 DIAGNOSIS — R319 Hematuria, unspecified: Secondary | ICD-10-CM | POA: Diagnosis present

## 2017-10-26 DIAGNOSIS — N281 Cyst of kidney, acquired: Secondary | ICD-10-CM | POA: Diagnosis present

## 2017-10-26 DIAGNOSIS — R1031 Right lower quadrant pain: Secondary | ICD-10-CM | POA: Diagnosis present

## 2017-10-26 DIAGNOSIS — D6862 Lupus anticoagulant syndrome: Secondary | ICD-10-CM | POA: Diagnosis present

## 2017-10-26 DIAGNOSIS — Z7902 Long term (current) use of antithrombotics/antiplatelets: Secondary | ICD-10-CM | POA: Diagnosis not present

## 2017-10-26 DIAGNOSIS — G8929 Other chronic pain: Secondary | ICD-10-CM | POA: Diagnosis present

## 2017-10-26 DIAGNOSIS — I82409 Acute embolism and thrombosis of unspecified deep veins of unspecified lower extremity: Secondary | ICD-10-CM | POA: Diagnosis not present

## 2017-10-26 DIAGNOSIS — Z95810 Presence of automatic (implantable) cardiac defibrillator: Secondary | ICD-10-CM | POA: Diagnosis not present

## 2017-10-26 DIAGNOSIS — N1330 Unspecified hydronephrosis: Secondary | ICD-10-CM | POA: Diagnosis present

## 2017-10-26 DIAGNOSIS — K661 Hemoperitoneum: Secondary | ICD-10-CM | POA: Diagnosis present

## 2017-10-26 DIAGNOSIS — N401 Enlarged prostate with lower urinary tract symptoms: Secondary | ICD-10-CM | POA: Diagnosis present

## 2017-10-26 DIAGNOSIS — I5022 Chronic systolic (congestive) heart failure: Secondary | ICD-10-CM | POA: Diagnosis present

## 2017-10-26 DIAGNOSIS — R338 Other retention of urine: Secondary | ICD-10-CM | POA: Diagnosis present

## 2017-10-26 DIAGNOSIS — N390 Urinary tract infection, site not specified: Secondary | ICD-10-CM | POA: Diagnosis not present

## 2017-10-26 DIAGNOSIS — E785 Hyperlipidemia, unspecified: Secondary | ICD-10-CM | POA: Diagnosis present

## 2017-10-26 DIAGNOSIS — I252 Old myocardial infarction: Secondary | ICD-10-CM | POA: Diagnosis not present

## 2017-10-26 DIAGNOSIS — N400 Enlarged prostate without lower urinary tract symptoms: Secondary | ICD-10-CM | POA: Diagnosis not present

## 2017-10-26 DIAGNOSIS — I2510 Atherosclerotic heart disease of native coronary artery without angina pectoris: Secondary | ICD-10-CM | POA: Diagnosis present

## 2017-10-26 DIAGNOSIS — R109 Unspecified abdominal pain: Secondary | ICD-10-CM | POA: Diagnosis not present

## 2017-10-26 HISTORY — DX: Calculus in bladder: N21.0

## 2017-10-26 LAB — COMPREHENSIVE METABOLIC PANEL
ALK PHOS: 46 U/L (ref 38–126)
ALT: 21 U/L (ref 0–44)
ANION GAP: 8 (ref 5–15)
AST: 24 U/L (ref 15–41)
Albumin: 3.9 g/dL (ref 3.5–5.0)
BUN: 29 mg/dL — ABNORMAL HIGH (ref 8–23)
CALCIUM: 9.3 mg/dL (ref 8.9–10.3)
CO2: 24 mmol/L (ref 22–32)
CREATININE: 1.49 mg/dL — AB (ref 0.61–1.24)
Chloride: 104 mmol/L (ref 98–111)
GFR, EST AFRICAN AMERICAN: 52 mL/min — AB (ref >60)
GFR, EST NON AFRICAN AMERICAN: 45 mL/min — AB (ref >60)
Glucose, Bld: 181 mg/dL — ABNORMAL HIGH (ref 70–99)
Potassium: 3.8 mmol/L (ref 3.5–5.1)
Sodium: 136 mmol/L (ref 135–145)
Total Bilirubin: 1.4 mg/dL — ABNORMAL HIGH (ref 0.3–1.2)
Total Protein: 7.3 g/dL (ref 6.5–8.1)

## 2017-10-26 LAB — URINALYSIS, ROUTINE W REFLEX MICROSCOPIC
BILIRUBIN URINE: NEGATIVE
Glucose, UA: 50 mg/dL — AB
KETONES UR: 5 mg/dL — AB
Leukocytes, UA: NEGATIVE
Nitrite: NEGATIVE
Protein, ur: 100 mg/dL — AB
Specific Gravity, Urine: >1.046 — ABNORMAL HIGH (ref 1.005–1.030)
pH: 5 (ref 5.0–8.0)

## 2017-10-26 LAB — BASIC METABOLIC PANEL
Anion gap: 9 (ref 5–15)
BUN: 26 mg/dL — AB (ref 8–23)
CO2: 22 mmol/L (ref 22–32)
Calcium: 8.7 mg/dL — ABNORMAL LOW (ref 8.9–10.3)
Chloride: 108 mmol/L (ref 98–111)
Creatinine, Ser: 1.16 mg/dL (ref 0.61–1.24)
GFR calc Af Amer: >60 mL/min (ref >60)
GLUCOSE: 162 mg/dL — AB (ref 70–99)
POTASSIUM: 4 mmol/L (ref 3.5–5.1)
SODIUM: 139 mmol/L (ref 135–145)

## 2017-10-26 LAB — CBC
HCT: 38.6 % — ABNORMAL LOW (ref 39.0–52.0)
Hemoglobin: 13.1 g/dL (ref 13.0–17.0)
MCH: 30.9 pg (ref 26.0–34.0)
MCHC: 33.9 g/dL (ref 30.0–36.0)
MCV: 91 fL (ref 78.0–100.0)
PLATELETS: 190 10*3/uL (ref 150–400)
RBC: 4.24 MIL/uL (ref 4.22–5.81)
RDW: 14.8 % (ref 11.5–15.5)
WBC: 10.5 10*3/uL (ref 4.0–10.5)

## 2017-10-26 LAB — TROPONIN I

## 2017-10-26 LAB — PROTIME-INR
INR: 2.78
PROTHROMBIN TIME: 29.2 s — AB (ref 11.4–15.2)

## 2017-10-26 LAB — TYPE AND SCREEN
ABO/RH(D): A POS
ANTIBODY SCREEN: NEGATIVE

## 2017-10-26 LAB — HEMOGLOBIN AND HEMATOCRIT, BLOOD
HCT: 37.5 % — ABNORMAL LOW (ref 39.0–52.0)
HEMATOCRIT: 37.1 % — AB (ref 39.0–52.0)
HEMATOCRIT: 35.8 % — AB (ref 39.0–52.0)
Hemoglobin: 12.5 g/dL — ABNORMAL LOW (ref 13.0–17.0)
Hemoglobin: 12.7 g/dL — ABNORMAL LOW (ref 13.0–17.0)
Hemoglobin: 12.2 g/dL — ABNORMAL LOW (ref 13.0–17.0)

## 2017-10-26 LAB — ABO/RH: ABO/RH(D): A POS

## 2017-10-26 LAB — MRSA PCR SCREENING: MRSA BY PCR: NEGATIVE

## 2017-10-26 LAB — LIPASE, BLOOD: LIPASE: 40 U/L (ref 11–51)

## 2017-10-26 MED ORDER — FUROSEMIDE 40 MG PO TABS
40.0000 mg | ORAL_TABLET | Freq: Every day | ORAL | Status: DC
  Administered 2017-10-26 – 2017-10-29 (×4): 40 mg via ORAL
  Filled 2017-10-26 (×4): qty 1

## 2017-10-26 MED ORDER — SPIRONOLACTONE 25 MG PO TABS
25.0000 mg | ORAL_TABLET | Freq: Every day | ORAL | Status: DC
  Administered 2017-10-27 – 2017-10-29 (×3): 25 mg via ORAL
  Filled 2017-10-26 (×3): qty 1

## 2017-10-26 MED ORDER — FENTANYL CITRATE (PF) 100 MCG/2ML IJ SOLN: 50.0000 ug | INTRAMUSCULAR | Status: DC | PRN

## 2017-10-26 MED ORDER — ACETAMINOPHEN 650 MG RE SUPP: 650.0000 mg | Freq: Four times a day (QID) | RECTAL | Status: DC | PRN

## 2017-10-26 MED ORDER — IOPAMIDOL (ISOVUE-300) INJECTION 61%
100.0000 mL | Freq: Once | INTRAVENOUS | Status: AC | PRN
  Administered 2017-10-26: 80 mL via INTRAVENOUS

## 2017-10-26 MED ORDER — VITAMIN K1 10 MG/ML IJ SOLN
INTRAMUSCULAR | Status: AC
  Filled 2017-10-26: qty 1

## 2017-10-26 MED ORDER — MORPHINE SULFATE (PF) 2 MG/ML IV SOLN
2.0000 mg | INTRAVENOUS | Status: DC | PRN
  Administered 2017-10-26: 2 mg via INTRAVENOUS
  Filled 2017-10-26: qty 1

## 2017-10-26 MED ORDER — SACUBITRIL-VALSARTAN 24-26 MG PO TABS
1.0000 | ORAL_TABLET | Freq: Two times a day (BID) | ORAL | Status: DC
  Administered 2017-10-26 – 2017-10-29 (×7): 1 via ORAL
  Filled 2017-10-26 (×7): qty 1

## 2017-10-26 MED ORDER — SODIUM CHLORIDE 0.9 % IV BOLUS
1000.0000 mL | Freq: Once | INTRAVENOUS | Status: AC
  Administered 2017-10-26: 1000 mL via INTRAVENOUS

## 2017-10-26 MED ORDER — NITROGLYCERIN 0.4 MG SL SUBL: 0.4000 mg | SUBLINGUAL_TABLET | SUBLINGUAL | Status: DC | PRN

## 2017-10-26 MED ORDER — OXYCODONE HCL 5 MG PO TABS
5.0000 mg | ORAL_TABLET | ORAL | Status: DC | PRN
  Administered 2017-10-26 – 2017-10-27 (×3): 5 mg via ORAL
  Filled 2017-10-26 (×3): qty 1

## 2017-10-26 MED ORDER — ADULT MULTIVITAMIN W/MINERALS CH
1.0000 | ORAL_TABLET | Freq: Every day | ORAL | Status: DC
  Administered 2017-10-27 – 2017-10-29 (×3): 1 via ORAL
  Filled 2017-10-26 (×4): qty 1

## 2017-10-26 MED ORDER — ONDANSETRON HCL 4 MG/2ML IJ SOLN: 4.0000 mg | Freq: Four times a day (QID) | INTRAMUSCULAR | Status: DC | PRN

## 2017-10-26 MED ORDER — POLYETHYLENE GLYCOL 3350 17 G PO PACK
17.0000 g | PACK | Freq: Every day | ORAL | Status: DC
  Administered 2017-10-27 – 2017-10-28 (×2): 17 g via ORAL
  Filled 2017-10-26 (×4): qty 1

## 2017-10-26 MED ORDER — ACETAMINOPHEN 325 MG PO TABS: 650.0000 mg | ORAL_TABLET | Freq: Four times a day (QID) | ORAL | Status: DC | PRN

## 2017-10-26 MED ORDER — ATORVASTATIN CALCIUM 40 MG PO TABS
80.0000 mg | ORAL_TABLET | Freq: Every day | ORAL | Status: DC
  Administered 2017-10-26 – 2017-10-28 (×3): 80 mg via ORAL
  Filled 2017-10-26: qty 2
  Filled 2017-10-26 (×2): qty 1
  Filled 2017-10-26 (×2): qty 2

## 2017-10-26 MED ORDER — IOPAMIDOL (ISOVUE-300) INJECTION 61%
30.0000 mL | Freq: Once | INTRAVENOUS | Status: AC | PRN
  Administered 2017-10-26: 15 mL via ORAL

## 2017-10-26 MED ORDER — VITAMIN D3 25 MCG (1000 UNIT) PO TABS
1000.0000 [IU] | ORAL_TABLET | Freq: Every day | ORAL | Status: DC
  Administered 2017-10-27 – 2017-10-29 (×3): 1000 [IU] via ORAL
  Filled 2017-10-26 (×3): qty 1

## 2017-10-26 MED ORDER — TAMSULOSIN HCL 0.4 MG PO CAPS
0.4000 mg | ORAL_CAPSULE | Freq: Every day | ORAL | Status: DC
  Administered 2017-10-26: 0.4 mg via ORAL
  Filled 2017-10-26: qty 1

## 2017-10-26 MED ORDER — FENTANYL CITRATE (PF) 100 MCG/2ML IJ SOLN
50.0000 ug | Freq: Once | INTRAMUSCULAR | Status: AC
  Administered 2017-10-26: 50 ug via INTRAVENOUS
  Filled 2017-10-26: qty 2

## 2017-10-26 MED ORDER — ONDANSETRON HCL 4 MG PO TABS: 4.0000 mg | ORAL_TABLET | Freq: Four times a day (QID) | ORAL | Status: DC | PRN

## 2017-10-26 MED ORDER — VITAMIN K1 10 MG/ML IJ SOLN
5.0000 mg | Freq: Once | INTRAVENOUS | Status: AC
  Administered 2017-10-26: 5 mg via INTRAVENOUS
  Filled 2017-10-26: qty 0.5

## 2017-10-26 MED ORDER — FENTANYL CITRATE (PF) 100 MCG/2ML IJ SOLN
50.0000 ug | Freq: Once | INTRAMUSCULAR | Status: DC
  Filled 2017-10-26: qty 2

## 2017-10-26 MED ORDER — FENTANYL CITRATE (PF) 100 MCG/2ML IJ SOLN
50.0000 ug | Freq: Once | INTRAMUSCULAR | Status: AC
  Administered 2017-10-26: 50 ug via INTRAVENOUS

## 2017-10-26 MED ORDER — DICLOFENAC SODIUM 1 % TD GEL
1.0000 "application " | Freq: Two times a day (BID) | TRANSDERMAL | Status: DC
  Administered 2017-10-26 – 2017-10-28 (×6): 1 via TOPICAL
  Filled 2017-10-26: qty 100

## 2017-10-26 MED ORDER — PANTOPRAZOLE SODIUM 40 MG PO TBEC
40.0000 mg | DELAYED_RELEASE_TABLET | Freq: Every day | ORAL | Status: DC
Start: 2017-10-26 — End: 2017-10-29
  Administered 2017-10-26 – 2017-10-29 (×4): 40 mg via ORAL
  Filled 2017-10-26 (×4): qty 1

## 2017-10-26 MED ORDER — ALLOPURINOL 300 MG PO TABS
300.0000 mg | ORAL_TABLET | Freq: Every day | ORAL | Status: DC
  Administered 2017-10-26 – 2017-10-29 (×4): 300 mg via ORAL
  Filled 2017-10-26 (×2): qty 1
  Filled 2017-10-26: qty 3
  Filled 2017-10-26: qty 1
  Filled 2017-10-26: qty 3
  Filled 2017-10-26: qty 1
  Filled 2017-10-26: qty 3

## 2017-10-26 MED ORDER — CARVEDILOL 6.25 MG PO TABS
6.2500 mg | ORAL_TABLET | Freq: Two times a day (BID) | ORAL | Status: DC
  Administered 2017-10-26 – 2017-10-29 (×6): 6.25 mg via ORAL
  Filled 2017-10-26 (×6): qty 1

## 2017-10-26 NOTE — Progress Notes (Signed)
Bilateral lower extremity venous duplex has been completed. Negative for DVT.  10/26/17 8:56 AM Carlos Levering RVT

## 2017-10-26 NOTE — ED Provider Notes (Signed)
Kevin Hart Provider Note   CSN: [id number] Arrival date & time: 10/25/17  2319     History   Chief Complaint Chief Complaint  Patient presents with  . Abdominal Pain    HPI Kevin Hart is a 72 y.o. male.  Patient presents with right-sided abdominal pain is progressively worsening since this afternoon.  Reports constant pain nothing makes it better nothing makes it worse.  Did eat dinner but has had multiple episodes of nausea and vomiting since before this.  States he vomited "25-30 times".  Denies any blood in his emesis.  Denies any diarrhea.  Denies with any pain with urination or blood in his urine.  Patient reports never having this kind of pain before.  No testicular pain, no back pain other than his chronic pain. Does take coumadin for a history of DVT. Does take coumadin for a history of DVT.  The history is provided by the patient.  Abdominal Pain   Associated symptoms include nausea and vomiting. Pertinent negatives include fever, dysuria, hematuria, headaches and myalgias.    Past Medical History:  Diagnosis Date  . Arthritis   . BPH (benign prostatic hyperplasia)    takes Flomax daily  . CAD (coronary artery disease)    a. prior LAD stenting;  b. 06/2014 NSTEMI/PCI: LM nl, LAD 30p, 29m ISR (3.0x20 Promus DES), LCX nl, RCA nondom, nl, EF 25-30% ant, apical, dist inf AK.  . Cataracts, bilateral    immature  . Chronic systolic CHF (congestive heart failure) ([place]) 12/31/2014  . DVT (deep venous thrombosis) ([facility])    RECURRENT left leg  . Dyslipidemia   . GERD (gastroesophageal reflux disease)   . Gout   . History of kidney stones   . Hypertension   . Ischemic cardiomyopathy 07/06/2014  . Lupus anticoagulant positive   . S/P drug eluting coronary stent placement, 07/06/14, Promus 07/07/2014  . Sleep apnea   . Subarachnoid bleed ([place]) 2015   after falling and hitting his head    Patient Active Problem List   Diagnosis Date Noted   . S/P left rotator cuff repair 03/05/2016  . Anterior dislocation of left shoulder 11/29/2015  . History of implantable cardioverter-defibrillator (ICD) placement 03/16/2015  . AICD (automatic cardioverter/defibrillator) present   . Chronic systolic CHF (congestive heart failure) ([place]) 12/31/2014  . Chronic systolic dysfunction of left ventricle 11/27/2014  . S/P drug eluting coronary stent placement, mLAD 07/06/14, Promus 07/07/2014  . At risk for sudden cardiac death, EF 25-30% with PVCs, with lifevest 07/07/2014  . Coronary artery disease involving native coronary artery of native heart with unstable angina pectoris ([place])   . Cardiomyopathy, ischemic   . History of DVT (deep vein thrombosis), on coumadin   . NSTEMI (non-ST elevated myocardial infarction) ([place]) 07/05/2014  . CAD (coronary artery disease)   . DVT (deep venous thrombosis), hx on coumadin   . Dyslipidemia   . GERD (gastroesophageal reflux disease)   . Anterior myocardial infarction ([place])   . Lupus anticoagulant positive     Past Surgical History:  Procedure Laterality Date  . COLONOSCOPY    . EP IMPLANTABLE DEVICE [name] 11/27/2014   [employer] Fortify Assura VR  implanted by Dr Rayann Heman for primary prevention  . LEFT HEART CATHETERIZATION WITH CORONARY ANGIOGRAM [name] 07/06/2014   Procedure: LEFT HEART CATHETERIZATION WITH CORONARY ANGIOGRAM;  Surgeon: Peter M Martinique, MD;  Location: [facility] CATH LAB;  Service: Cardiovascular;  Laterality: [name];  . PERCUTANEOUS CORONARY STENT  INTERVENTION (PCI-S) Right 07/06/2014   Procedure: PERCUTANEOUS CORONARY STENT INTERVENTION (PCI-S);  Surgeon: Peter M Martinique, MD;  Location: [facility] CATH LAB;  Service: Cardiovascular;  Laterality: Right;  . SHOULDER ARTHROSCOPY WITH SUBACROMIAL DECOMPRESSION Left 03/05/2016   Procedure: SHOULDER ARTHROSCOPY ROTATOR CUFF REPAIR WITH SUBACROMIAL DECOMPRESSION;  Surgeon: Tania Ade, MD;  Location: [facility];  Service: Orthopedics;  Laterality: Left;  SHOULDER  ARTHROSCOPY ROTATOR CUFF REPAIR WITH SUBACROMIAL DECOMPRESSION  . SHOULDER CLOSED REDUCTION Left 11/29/2015   Procedure: CLOSED REDUCTION SHOULDER;  Surgeon: Tania Ade, MD;  Location: [facility];  Service: Orthopedics;  Laterality: Left;  . WRIST SURGERY Bilateral    plates and screws         Home Medications    Prior to Admission medications   Medication Sig Start Date End Date Taking? Authorizing Provider  allopurinol (ZYLOPRIM) 300 MG tablet Take 1 tablet by mouth daily. 02/27/16   [provider]  Ascorbic Acid (VITAMIN C) 1000 MG tablet Take 1,000 mg by mouth daily.    [provider]  atorvastatin (LIPITOR) 80 MG tablet Take 1 tablet (80 mg total) by mouth daily at 6 PM. 08/10/16   Martinique, Peter M, MD  carvedilol (COREG) 6.25 MG tablet Take 6.25 mg by mouth 2 (two) times daily with a meal.    [provider]  cholecalciferol (VITAMIN D) 1000 units tablet Take 2,000 Units by mouth daily.    [provider]  clopidogrel (PLAVIX) 75 MG tablet Take 75 mg by mouth daily.    [provider]  ENTRESTO 24-26 MG TAKE ONE TABLET BY MOUTH TWICE DAILY -TO REPLACE LISINOPRIL (START 36HOURS AFTER LAST DOSE) 07/19/17   Martinique, Peter M, MD  eplerenone (INSPRA) 25 MG tablet Take 25 mg by mouth daily.    [provider]  furosemide (LASIX) 40 MG tablet Take 40 mg by mouth daily.     [provider]  lisinopril (PRINIVIL,ZESTRIL) 2.5 MG tablet Take 1 tablet (2.5 mg total) by mouth daily. 05/13/17   Martinique, Peter M, MD  Multiple Vitamin (MULTI-VITAMINS) TABS Take 1 tablet by mouth daily.     [provider]  nitroGLYCERIN (NITROSTAT) 0.4 MG SL tablet Place 1 tablet (0.4 mg total) under the tongue every 5 (five) minutes x 3 doses as needed for chest pain. Patient not taking: Reported on 05/20/2017 10/09/14   Martinique, Peter M, MD  pantoprazole (PROTONIX) 40 MG tablet Take 1 tablet (40 mg total) by mouth daily. 07/07/14   Lendon Colonel,  NP  Tamsulosin HCl (FLOMAX) 0.4 MG CAPS Take 0.4 mg by mouth daily.     [provider]  warfarin (COUMADIN) 5 MG tablet Take 1 tablet (5 mg total) by mouth daily. 10/08/16   Almyra Deforest, PA    Family History Family History  Problem Relation Age of Onset  . Aneurysm Father     Social History Social History   Tobacco Use  . Smoking status: Never Smoker  . Smokeless tobacco: Never Used  Substance Use Topics  . Alcohol use: No    Alcohol/week: 0.0 oz  . Drug use: No     Allergies   No known allergies   Review of Systems Review of Systems  Constitutional: Positive for activity change and appetite change. Negative for fever.  HENT: Negative for congestion and rhinorrhea.   Eyes: Negative for visual disturbance.  Respiratory: Negative for cough, chest tightness and shortness of breath.   Cardiovascular: Negative for chest pain and leg swelling.  Gastrointestinal: Positive for abdominal pain, nausea and vomiting.  Genitourinary: Negative for dysuria, hematuria and testicular pain.  Musculoskeletal: Positive for back pain. Negative for myalgias.  Skin: Negative for rash.  Neurological: Negative for dizziness, weakness, light-headedness and headaches.    all other systems are negative except as noted in the HPI and PMH.    Physical Exam Updated Vital Signs BP 121/70   Pulse (!) 120   Temp 99 F (37.2 C) (Oral)   Resp (!) 28   Ht 6' (1.829 m)   Wt 111.1 kg (245 lb)   SpO2 98%   BMI 33.23 kg/m   Physical Exam  Constitutional: He is oriented to person, place, and time. He appears well-developed and well-nourished. He appears ill. No distress.  HENT:  Head: Normocephalic and atraumatic.  Mouth/Throat: Oropharynx is clear and moist. No oropharyngeal exudate.  Eyes: Pupils are equal, round, and reactive to light. Conjunctivae and EOM are normal.  Neck: Normal range of motion. Neck supple.  No meningismus.  Cardiovascular: Normal rate, regular rhythm, normal  heart sounds and intact distal pulses.  No murmur heard. Pulmonary/Chest: Effort normal and breath sounds normal. No respiratory distress.  Abdominal: Soft. There is tenderness. There is guarding. There is no rebound.  TTP RLQ with guarding. Intact femoral pulses.  Genitourinary:  Genitourinary Comments: No testicular pain. No appreciable hernia  Musculoskeletal: Normal range of motion. He exhibits no edema or tenderness.  No CVAT  Neurological: He is alert and oriented to person, place, and time. No cranial nerve deficit. He exhibits normal muscle tone. Coordination normal.  No ataxia on finger to nose bilaterally. No pronator drift. 5/5 strength throughout. CN 2-12 intact.Equal grip strength. Sensation intact.   Skin: Skin is warm.  Psychiatric: He has a normal mood and affect. His behavior is normal.  Nursing note and vitals reviewed.    ED Treatments / Results  Labs (all labs ordered are listed, but only abnormal results are displayed) Labs Reviewed  COMPREHENSIVE METABOLIC PANEL - Abnormal; Notable for the following components:      Result Value   Glucose, Bld 181 (*)    BUN 29 (*)    Creatinine, Ser 1.49 (*)    Total Bilirubin 1.4 (*)    GFR calc non Af Amer 45 (*)    GFR calc Af Amer 52 (*)    All other components within normal limits  CBC - Abnormal; Notable for the following components:   WBC 12.2 (*)    All other components within normal limits  PROTIME-INR - Abnormal; Notable for the following components:   Prothrombin Time 29.2 (*)    All other components within normal limits  LIPASE, BLOOD  TROPONIN I  URINALYSIS, ROUTINE W REFLEX MICROSCOPIC  TYPE AND SCREEN    EKG EKG Interpretation  Date/Time:  Monday October 25 2017 23:43:44 EDT Ventricular Rate:  86 PR Interval:    QRS Duration: 100 QT Interval:  394 QTC Calculation: 472 R Axis:   27 Text Interpretation:  Sinus rhythm Anterior infarct, old No significant change was found Confirmed by Ezequiel Essex [phone]) on 10/25/2017 11:59:49 PM   Radiology Dg Chest 2 View  Result Date: 10/26/2017 CLINICAL DATA:  Acute onset of nausea, vomiting and fever. EXAM: CHEST - 2 VIEW COMPARISON:  Chest radiograph performed 10/19/2015 FINDINGS: The lungs are well-aerated. Mild left basilar airspace opacity likely reflects atelectasis. There is no evidence of pleural effusion or pneumothorax. The heart is normal in size; an AICD is noted  at the left chest wall, with a single lead ending at the right ventricle. No acute osseous abnormalities are seen. IMPRESSION: Mild left basilar airspace opacity likely reflects atelectasis; lungs otherwise clear. Electronically Signed   By: Garald Balding M.D.   On: 10/26/2017 01:41   Ct Abdomen Pelvis W Contrast  Result Date: 10/26/2017 CLINICAL DATA:  Acute onset of right lower quadrant abdominal pain, nausea and vomiting. EXAM: CT ABDOMEN AND PELVIS WITH CONTRAST TECHNIQUE: Multidetector CT imaging of the abdomen and pelvis was performed using the standard protocol following bolus administration of intravenous contrast. CONTRAST:  28mL ISOVUE-300 IOPAMIDOL (ISOVUE-300) INJECTION 61% COMPARISON:  CT of the abdomen and pelvis performed 04/29/2001, and MRI of the lumbar spine performed 03/13/2002 FINDINGS: Lower chest: Minimal bibasilar atelectasis or scarring is noted. A pacemaker lead is partially imaged. Mild wall thickening at the distal esophagus may reflect chronic inflammation. Hepatobiliary: Scattered tiny hypodensities within the liver are nonspecific but may reflect small cysts. The liver is otherwise unremarkable. The gallbladder is unremarkable. The common bile duct is normal in caliber. Pancreas: The pancreas is within normal limits. Spleen: The spleen is unremarkable in appearance. Adrenals/Urinary Tract: The adrenal glands are unremarkable in appearance. There is a large heterogeneous lesion arising at the interpole region of the right kidney, measuring approximately  10.5 x 9.5 x 8.4 cm, with foci of contrast blush at the superior aspect of the lesion. Given that this is in the same location as a previously noted large simple-appearing cyst 16 years ago, this appears to reflect active hemorrhage into a large renal cyst. Associated blood tracks about the right kidney and inferiorly along the right paracolic gutter. 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 Underlying mild right-sided hydronephrosis is noted, with apparent obstruction of the right renal pelvis. Blood tracks about the course of the right ureter. A left renal cyst is noted. The left kidney is otherwise unremarkable. There is no evidence of hydronephrosis on the left. No renal or ureteral stones are identified. Stomach/Bowel: The stomach is unremarkable in appearance. The small bowel is within normal limits. The appendix is normal in caliber, without evidence of appendicitis, seen adjacent to the inferior tip of the liver. Minimal diverticulosis is noted at the proximal sigmoid colon, without evidence of diverticulitis. 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 Scattered calcification is seen along the abdominal aorta and its branches. Blood tracks about the inferior vena cava, but the IVC is otherwise unremarkable in appearance. No retroperitoneal or pelvic sidewall lymphadenopathy is seen. Reproductive: An apparent stone is seen dependently within the bladder. The bladder is somewhat irregular in appearance, possibly reflecting chronic inflammation. The prostate is enlarged, measuring 6.2 cm in transverse dimension, with scattered calcification. Other: No additional soft tissue abnormalities are seen. Musculoskeletal: No acute osseous abnormalities are identified. Multilevel vacuum phenomenon is noted along  the lumbar spine. The visualized musculature is unremarkable in appearance. IMPRESSION: 1. Large heterogeneous lesion arising at the interpole region of the right kidney, measuring 10.4 x 9.5 x 8.4 cm, with foci of contrast blush at the superior aspect of the lesion. Given that this is in the same location as a previously noted large simple-appearing cyst 16 years ago, this appears to reflect active hemorrhage into a large renal cyst. 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 Associated blood tracks about the right kidney and inferiorly along the right paracolic gutter. 2. Mild right-sided hydronephrosis, with apparent obstruction of the right renal pelvis. Blood tracks about the course of the right ureter. 3. 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 Scattered calcification along the abdominal aorta and its branches. Recommend followup by ultrasound in 1 year. This recommendation follows ACR consensus guidelines: White Paper of the ACR Incidental Findings Committee II on Vascular Findings. J Am Coll Radiol 2013; 10:789-794. 4. Scattered tiny hypodensities within the liver are nonspecific but may reflect small cysts. 5. Minimal diverticulosis at the proximal sigmoid colon, without evidence of diverticulitis. 6. Mildly irregular appearance to the bladder, possibly reflecting chronic inflammation. Apparent bladder stone noted. 7. Significantly enlarged prostate.  Would correlate with PSA. 8. Mild wall thickening at the distal esophagus may reflect chronic inflammation. 9. Small left renal cyst noted. These results were called by telephone at the time of interpretation on 10/26/2017 at 1:40 am to Dr. Ezequiel Essex, who verbally acknowledged these results. Electronically Signed   By: Garald Balding M.D.   On: 10/26/2017 01:41    Procedures Procedures  (including critical care time)  Medications Ordered in ED Medications  fentaNYL (SUBLIMAZE) injection 50 mcg (has no administration in time range)  ondansetron (ZOFRAN) injection 4 mg (has no administration in time range)     Initial Impression / Assessment and Plan / ED Course  I have reviewed the triage vital signs and the nursing notes.  Pertinent labs & imaging results that were available during my care of the patient were reviewed by me and considered in my medical decision making (see chart for details).    Right lower quadrant pain with nausea and vomiting.  Patient appears ill but nontoxic.  He is tachycardic and borderline febrile.  UA pending. CT scan will be obtained to evaluate for appendicitis.   CT results discussed with Dr. Radene Knee of radiology.  There is concern for actively bleeding right renal cyst with extravasation along the right pericolic gutter. Right hydronephrosis.   Patient remains hemodynamically stable.  Hemoglobin is 14.7.  INR is 2.7.  Blood products and FFP not available.  Patient given vitamin K. we will hold Kcentra at this time due to hemodynamic stability.  Discussed with Dr. Clydene Laming of urology who will see patient on arrival at [place] with reversal of Coumadin and monitoring hemoglobin.  Also discussed with Dr. Vernard Gambles of interventional radiology who is reviewing patient's images and will see patient as needed when he gets to [facility].  Dr. Stark Jock  aware of patient transfer to [facility] ED. Patient will need FFP transfusion to reverse Coumadin when he arrives to [place] [facility]. will also need to make urology aware of patient arrival. Patient and wife updated.  CRITICAL CARE Performed by: Ezequiel Essex Total critical care time: 60 minutes Critical care time was exclusive of separately billable procedures and treating other patients. Critical care was necessary to treat or prevent imminent or life-threatening  deterioration. Critical care was time spent personally by me on the following activities: development of treatment plan with patient and/or surrogate as well as nursing, discussions with consultants, evaluation of patient's response to treatment, examination of patient, obtaining history from patient or surrogate, ordering and performing treatments and interventions, ordering and review of laboratory studies, ordering  and review of radiographic studies, pulse oximetry and re-evaluation of patient's condition.  Final Clinical Impressions(s) / ED Diagnoses   Final diagnoses:  Renal hemorrhage, right  Hemoperitoneum    ED Discharge Orders    None       Prince Couey, Annie Main, MD 10/26/17 [phone]

## 2017-10-26 NOTE — ED Notes (Signed)
ED TO INPATIENT HANDOFF REPORT  Name/Age/Gender Kevin Hart 72 y.o. male  Code Status    Code Status Orders  (From admission, onward)        Start     Ordered   10/26/17 1037  Full code  Continuous     10/26/17 1036    Code Status History    Date Active Date Inactive Code Status Order ID Comments User Context   03/05/2016 1536 03/06/2016 1349 Full Code [id number]  Grier Mitts, PA-C Inpatient   11/27/2014 1131 11/27/2014 2055 Full Code [id number]  Thompson Grayer, MD Inpatient   07/06/2014 1520 07/07/2014 1602 Full Code [id number]  Martinique, Peter M, MD Inpatient   07/05/2014 0347 07/06/2014 1520 Full Code [id number]  Herbert Deaner, MD Inpatient      Home/SNF/Other Home  Chief Complaint ABD PAIN  Level of Care/Admitting Diagnosis ED Disposition    ED Disposition Condition [place]: [facility] [100102]  Level of Care: Stepdown [14]  Admit to SDU based on following criteria: Hemodynamic compromise or significant risk of instability:  Patient requiring short term acute titration and management of vasoactive drips, and invasive monitoring (i.e., CVP and Arterial line).  Diagnosis: Hemorrhage of kidney [416384]  Admitting Physician: Elodia Florence [phone]  Attending Physician: Cephus Slater, A CALDWELL [phone]  Estimated length of stay: past midnight tomorrow  Certification:: I certify this patient will need inpatient services for at least 2 midnights  PT Class (Do Not Modify): Inpatient [101]  PT Acc Code (Do Not Modify): Private [1]       Medical History Past Medical History:  Diagnosis Date  . Arthritis   . Bladder stone 10/26/2017  . BPH (benign prostatic hyperplasia)    takes Flomax daily  . CAD (coronary artery disease)    a. prior LAD stenting;  b. 06/2014 NSTEMI/PCI: LM nl, LAD 30p, 56mISR (3.0x20 Promus DES), LCX nl, RCA nondom, nl, EF 25-30% ant, apical, dist inf [place].  . Cataracts, bilateral    immature  .  Chronic systolic CHF (congestive heart failure) [place] 12/[place]  . DVT (deep venous thrombosis) (HCC)    RECURRENT left leg  . Dyslipidemia   . GERD (gastroesophageal reflux disease)   . Gout   . History of kidney stones   . Hypertension   . Ischemic cardiomyopathy 07/06/2014  . Lupus anticoagulant positive   . S/P drug eluting coronary stent placement, 07/06/14, Promus 07/07/2014  . Sleep apnea   . [place]noid [place]   after falling and hitting his head    Allergies Allergies  Allergen Reactions  . No Known Allergies     IV Location/Drains/Wounds Patient Lines/Drains/Airways Status   Active Line/Drains/Airways    Name:   Placement date:   Placement time:   Site:   Days:   Peripheral IV 10/25/17 Left Antecubital   10/25/17    2355    Antecubital   1   Peripheral IV 10/26/17 Left Hand   10/26/17    0323    Hand   less than 1   Urethral Catheter Preslie Depasquale RN Coude 16 Fr.   10/26/17    1053    Coude   less than 1   Incision (Closed) 11/29/15 Shoulder   11/29/15    0147     697   Incision (Closed) 03/05/16 Arm Left   03/05/16    1312     600  Labs/Imaging Results for orders placed or performed during the hospital encounter of 10/25/17 (from the past 48 hour(s))  Lipase, blood     Status: None   Collection Time: 10/25/17 11:49 PM  Result Value Ref Range   Lipase 40 11 - 51 U/L    Comment: Performed at [facility], [address] [place], [place] [address]  Comprehensive metabolic panel     Status: Abnormal   Collection Time: 10/25/17 11:49 PM  Result Value Ref Range   Sodium 136 135 - 145 mmol/L   Potassium 3.8 3.5 - 5.1 mmol/L   Chloride 104 98 - 111 mmol/L   CO2 24 22 - 32 mmol/L   Glucose, Bld 181 (H) 70 - 99 mg/dL   BUN 29 (H) 8 - 23 mg/dL   Creatinine, Ser 1.49 (H) 0.61 - 1.24 mg/dL   Calcium 9.3 8.9 - 10.3 mg/dL   Total Protein 7.3 6.5 - 8.1 g/dL   Albumin 3.9 3.5 - 5.0 g/dL   AST 24 15 - 41 U/L   ALT 21 0 - 44 U/L   Alkaline  Phosphatase 46 38 - 126 U/L   Total Bilirubin 1.4 (H) 0.3 - 1.2 mg/dL   GFR calc non Af Amer 45 (L) >60 mL/min   GFR calc Af Amer 52 (L) >60 mL/min    Comment: (NOTE) The eGFR has been calculated using the CKD EPI equation. This calculation has not been validated in all clinical situations. eGFR's persistently <60 mL/min signify possible Chronic Kidney Disease.    Anion gap 8 5 - 15    Comment: Performed at [facility], [address] [place], [place] [address]  CBC     Status: Abnormal   Collection Time: 10/25/17 11:49 PM  Result Value Ref Range   WBC 12.2 (H) 4.0 - 10.5 K/uL   RBC 4.69 4.22 - 5.81 MIL/uL   Hemoglobin 14.7 13.0 - 17.0 g/dL   HCT 42.0 39.0 - 52.0 %   MCV 89.6 78.0 - 100.0 fL   MCH 31.3 26.0 - 34.0 pg   MCHC 35.0 30.0 - 36.0 g/dL   RDW 14.9 11.5 - 15.5 %   Platelets 201 150 - 400 K/uL    Comment: Performed at [facility], [address] [place], [place] [address]  Protime-INR     Status: Abnormal   Collection Time: 10/25/17 11:49 PM  Result Value Ref Range   Prothrombin Time 29.2 (H) 11.4 - 15.2 seconds   INR 2.78     Comment: Performed at [facility], [address] [place], [place] [address]  Troponin I     Status: None   Collection Time: 10/25/17 11:49 PM  Result Value Ref Range   Troponin I <0.03 <0.03 ng/mL    Comment: Performed at [facility], [address] [place], [place] [address]  Type and screen [place]     Status: None   Collection Time: 10/26/17  3:25 AM  Result Value Ref Range   ABO/RH(D) A POS    Antibody Screen NEG    Sample Expiration      10/29/2017 Performed at [facility], [place] [address], [place], [place] [address]   ABO/Rh     Status: None   Collection Time: 10/26/17  3:25 AM  Result Value Ref Range   ABO/RH(D)      A POS Performed at [facility], [place] [address] [place], [place] [address]   Urinalysis, Routine w  reflex microscopic     Status: Abnormal   Collection Time: 10/26/17  3:57 AM  Result Value Ref Range   Color, Urine YELLOW YELLOW   APPearance CLOUDY (A) CLEAR   Specific Gravity, Urine >1.046 (H) 1.005 - 1.030   pH 5.0 5.0 - 8.0   Glucose, UA 50 (A) NEGATIVE mg/dL   Hgb urine dipstick LARGE (A) NEGATIVE   Bilirubin Urine NEGATIVE NEGATIVE   Ketones, ur 5 (A) NEGATIVE mg/dL   Protein, ur 100 (A) NEGATIVE mg/dL   Nitrite NEGATIVE NEGATIVE   Leukocytes, UA NEGATIVE NEGATIVE   RBC / HPF >50 (H) 0 - 5 RBC/hpf   WBC, UA 6-10 0 - 5 WBC/hpf   Bacteria, UA RARE (A) NONE SEEN   Mucus PRESENT    Budding Yeast PRESENT    Hyaline Casts, UA PRESENT     Comment: Performed at [facility], [place] [address] [place], [place] [address]  CBC     Status: Abnormal   Collection Time: 10/26/17  4:15 AM  Result Value Ref Range   WBC 10.5 4.0 - 10.5 K/uL   RBC 4.24 4.22 - 5.81 MIL/uL   Hemoglobin 13.1 13.0 - 17.0 g/dL   HCT 38.6 (L) 39.0 - 52.0 %   MCV 91.0 78.0 - 100.0 fL   MCH 30.9 26.0 - 34.0 pg   MCHC 33.9 30.0 - 36.0 g/dL   RDW 14.8 11.5 - 15.5 %   Platelets 190 150 - 400 K/uL    Comment: Performed at [facility], [place] [address], [place], [place] [address]  Basic metabolic panel     Status: Abnormal   Collection Time: 10/26/17  4:15 AM  Result Value Ref Range   Sodium 139 135 - 145 mmol/L   Potassium 4.0 3.5 - 5.1 mmol/L   Chloride 108 98 - 111 mmol/L   CO2 22 22 - 32 mmol/L   Glucose, Bld 162 (H) 70 - 99 mg/dL   BUN 26 (H) 8 - 23 mg/dL   Creatinine, Ser 1.16 0.61 - 1.24 mg/dL   Calcium 8.7 (L) 8.9 - 10.3 mg/dL   GFR calc non Af Amer >60 >60 mL/min   GFR calc Af Amer >60 >60 mL/min    Comment: (NOTE) The eGFR has been calculated using the CKD EPI equation. This calculation has not been validated in all clinical situations. eGFR's persistently <60 mL/min signify possible Chronic Kidney Disease.    Anion gap 9 5 - 15    Comment: Performed at  [facility], [place] [address], [place], [place] [address]  Hemoglobin and hematocrit, blood     Status: Abnormal   Collection Time: 10/26/17 11:17 AM  Result Value Ref Range   Hemoglobin 12.5 (L) 13.0 - 17.0 g/dL   HCT 37.1 (L) 39.0 - 52.0 %    Comment: Performed at [facility], [place] [address], [place], [place] [address]   Dg Chest 2 View  Result Date: 10/26/2017 CLINICAL DATA:  Acute onset of nausea, vomiting and fever. EXAM: CHEST - 2 VIEW COMPARISON:  Chest radiograph performed 10/19/2015 FINDINGS: The lungs are well-aerated. Mild left basilar airspace opacity likely reflects atelectasis. There is no evidence of pleural effusion or pneumothorax. The heart is normal in size; an AICD is noted at the left chest wall, with a single lead ending at the right ventricle. No acute osseous abnormalities are seen.  IMPRESSION: Mild left basilar airspace opacity likely reflects atelectasis; lungs otherwise clear. Electronically Signed   By: Garald Balding M.D.   On: 10/26/2017 01:41   Ct Abdomen Pelvis W Contrast  Result Date: 10/26/2017 CLINICAL DATA:  Acute onset of right lower quadrant abdominal pain, nausea and vomiting. EXAM: CT ABDOMEN AND PELVIS WITH CONTRAST TECHNIQUE: Multidetector CT imaging of the abdomen and pelvis was performed using the standard protocol following bolus administration of intravenous contrast. CONTRAST:  20m ISOVUE-300 IOPAMIDOL (ISOVUE-300) INJECTION 61% COMPARISON:  CT of the abdomen and pelvis performed 04/29/2001, and MRI of the lumbar spine performed 03/13/2002 FINDINGS: Lower chest: Minimal bibasilar atelectasis or scarring is noted. A pacemaker lead is partially imaged. Mild wall thickening at the distal esophagus may reflect chronic inflammation. Hepatobiliary: Scattered tiny hypodensities within the liver are nonspecific but may reflect small cysts. The liver is otherwise unremarkable. The gallbladder is unremarkable. The common bile  duct is normal in caliber. Pancreas: The pancreas is within normal limits. Spleen: The spleen is unremarkable in appearance. Adrenals/Urinary Tract: The adrenal glands are unremarkable in appearance. There is a large heterogeneous lesion arising at the interpole region of the right kidney, measuring approximately 10.5 x 9.5 x 8.4 cm, with foci of contrast blush at the superior aspect of the lesion. Given that this is in the same location as a previously noted large simple-appearing cyst 16 years ago, this appears to reflect active hemorrhage into a large renal cyst. Associated blood tracks about the right kidney and inferiorly along the right paracolic gutter. 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 Underlying mild right-sided hydronephrosis is noted, with apparent obstruction of the right renal pelvis. Blood tracks about the course of the right ureter. A left renal cyst is noted. The left kidney is otherwise unremarkable. There is no evidence of hydronephrosis on the left. No renal or ureteral stones are identified. Stomach/Bowel: The stomach is unremarkable in appearance. The small bowel is within normal limits. The appendix is normal in caliber, without evidence of appendicitis, seen adjacent to the inferior tip of the liver. Minimal diverticulosis is noted at the proximal sigmoid colon, without evidence of diverticulitis. 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 Scattered calcification is seen along the abdominal aorta and its branches. Blood tracks about the inferior vena cava, but the IVC is otherwise unremarkable in appearance. No retroperitoneal or pelvic sidewall lymphadenopathy is seen. Reproductive: An apparent stone is seen dependently within the bladder. The bladder is somewhat irregular in  appearance, possibly reflecting chronic inflammation. The prostate is enlarged, measuring 6.2 cm in transverse dimension, with scattered calcification. Other: No additional soft tissue abnormalities are seen. Musculoskeletal: No acute osseous abnormalities are identified. Multilevel vacuum phenomenon is noted along the lumbar spine. The visualized musculature is unremarkable in appearance. IMPRESSION: 1. Large heterogeneous lesion arising at the interpole region of the right kidney, measuring 10.4 x 9.5 x 8.4 cm, with foci of contrast blush at the superior aspect of the lesion. Given that this is in the same location as a previously noted large simple-appearing cyst 16 years ago, this appears to reflect active hemorrhage into a large renal cyst. 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 Associated blood tracks about the right kidney and inferiorly along the right paracolic  gutter. 2. Mild right-sided hydronephrosis, with apparent obstruction of the right renal pelvis. Blood tracks about the course of the right ureter. 3. 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 Scattered calcification along the abdominal aorta and its branches. Recommend followup by ultrasound in 1 year. This recommendation follows ACR consensus guidelines: White Paper of the ACR Incidental Findings Committee II on Vascular Findings. J Am Coll Radiol 2013; 10:789-794. 4. Scattered tiny hypodensities within the liver are nonspecific but may reflect small cysts. 5. Minimal diverticulosis at the proximal sigmoid colon, without evidence of diverticulitis. 6. Mildly irregular appearance to the bladder, possibly reflecting chronic inflammation. Apparent bladder stone noted. 7. Significantly enlarged prostate.  Would correlate with PSA. 8. Mild wall thickening at the distal esophagus may  reflect chronic inflammation. 9. Small left renal cyst noted. These results were called by telephone at the time of interpretation on 10/26/2017 at 1:40 am to Dr. Ezequiel Essex, who verbally acknowledged these results. Electronically Signed   By: Garald Balding M.D.   On: 10/26/2017 01:41    Pending Labs Unresulted Labs (From admission, onward)   Start     Ordered   10/27/17 0500  Comprehensive metabolic panel  Tomorrow morning,   R     10/26/17 1036   10/27/17 0500  CBC  Tomorrow morning,   R     10/26/17 1036   10/26/17 1100  Hemoglobin and hematocrit, blood  Now then every 6 hours,   R     10/26/17 0812      Vitals/Pain Today's Vitals   10/26/17 1055 10/26/17 1134 10/26/17 1225 10/26/17 1230  BP:  118/70 108/60 118/62  Pulse:  73 78 69  Resp:  '19 19 14  '$ Temp:  98.6 F (37 C) 98.3 F (36.8 C)   TempSrc:  Oral Oral   SpO2:  96% 96% 97%  Weight:      Height:      PainSc: 2        Isolation Precautions No active isolations  Medications Medications  phytonadione (VITAMIN K) 10 MG/ML SQ injection (0 mg  Hold 10/26/17 0234)  fentaNYL (SUBLIMAZE) injection 50 mcg (0 mcg Intravenous Hold 10/26/17 0242)  allopurinol (ZYLOPRIM) tablet 300 mg (has no administration in time range)  atorvastatin (LIPITOR) tablet 80 mg (has no administration in time range)  carvedilol (COREG) tablet 6.25 mg (has no administration in time range)  cholecalciferol (VITAMIN D) tablet 1,000 Units (has no administration in time range)  diclofenac sodium (VOLTAREN) 1 % transdermal gel 1 application (has no administration in time range)  sacubitril-valsartan (ENTRESTO) 24-26 mg per tablet (has no administration in time range)  spironolactone (ALDACTONE) tablet 25 mg (has no administration in time range)  furosemide (LASIX) tablet 40 mg (40 mg Oral Given 10/26/17 1112)  MULTI-VITAMINS TABS 1 tablet (has no administration in time range)  nitroGLYCERIN (NITROSTAT) SL tablet 0.4 mg (has no administration in time  range)  pantoprazole (PROTONIX) EC tablet 40 mg (40 mg Oral Given 10/26/17 1112)  tamsulosin (FLOMAX) capsule 0.4 mg (0.4 mg Oral Given 10/26/17 1112)  acetaminophen (TYLENOL) tablet 650 mg (has no administration in time range)    Or  acetaminophen (TYLENOL) suppository 650 mg (has no administration in time range)  oxyCODONE (Oxy IR/ROXICODONE) immediate release tablet 5 mg (has no administration in time range)  polyethylene glycol (MIRALAX / GLYCOLAX) packet 17 g (17 g  Oral Not Given 10/26/17 1116)  ondansetron (ZOFRAN) tablet 4 mg (has no administration in time range)    Or  ondansetron (ZOFRAN) injection 4 mg (has no administration in time range)  fentaNYL (SUBLIMAZE) injection 50 mcg (has no administration in time range)  fentaNYL (SUBLIMAZE) injection 50 mcg (50 mcg Intravenous Given 10/26/17 0016)  ondansetron (ZOFRAN) injection 4 mg (4 mg Intravenous Given 10/26/17 0015)  iopamidol (ISOVUE-300) 61 % injection 30 mL (15 mLs Oral Contrast Given 10/26/17 0104)  iopamidol (ISOVUE-300) 61 % injection 100 mL (80 mLs Intravenous Contrast Given 10/26/17 0104)  phytonadione (VITAMIN K) 5 mg in dextrose 5 % 50 mL IVPB (0 mg Intravenous Stopped 10/26/17 0243)  sodium chloride 0.9 % bolus 1,000 mL (0 mLs Intravenous Stopped 10/26/17 0428)  fentaNYL (SUBLIMAZE) injection 50 mcg (50 mcg Intravenous Given 10/26/17 0241)  fentaNYL (SUBLIMAZE) injection 50 mcg (50 mcg Intravenous Given 10/26/17 0448)    Mobility Walks

## 2017-10-26 NOTE — Progress Notes (Signed)
Pt. s/u with BiPAP/Auto set per home regimen-20/6, humidifier filled with S/W currently on room air, Lg/FFM tolerating well, made aware to notify if help needed.

## 2017-10-26 NOTE — H&P (Addendum)
History and Physical    GERREN HOFFMEIER IBB:[id number] DOB: [birth year] DOA: 10/25/2017  PCP: Guadlupe Spanish, MD  Patient coming from: home  I have personally briefly reviewed patient's old medical records in [place]  Chief Complaint: abdominal pain  HPI: GRIFFIN GERRARD is Quanisha Drewry 72 y.o. 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  He notes that his pain started yesterday sometime during the day.  You describes it as tremendous pain in his right lower abdomen.  He was appendicitis.  They were planning on seeing his doctor today, but around 10:30 at night he could not stand the pain anymore.  He describes the pain as grabbing and squeezing.  He describes it as constant.  Nothing made it worse, but pain meds made it better.  He does not note any fevers.  He did note some nausea and vomiting.  He denies any chills or chest pain.  He denies any dysuria.  He notes that his urine was brown in the emergency department, but did not note any bright red blood.   ED Course: Labs, EKG, CT.  Vitamin K.  Urology.  Medicine admission.  Review of Systems: As per HPI otherwise 10 point review of systems negative.   Past Medical History:  Diagnosis Date  . Arthritis   . Bladder stone 10/26/2017  . BPH (benign prostatic hyperplasia)    takes Flomax daily  . CAD (coronary artery disease)    Maliyah Willets. prior LAD stenting;  b. 06/2014 NSTEMI/PCI: LM nl, LAD 30p, 7m ISR (3.0x20 Promus DES), LCX nl, RCA nondom, nl, EF 25-30% ant, apical, dist inf [place].  . Cataracts, bilateral    immature  . Chronic systolic CHF (congestive heart failure) ([place]) 12/31/2014  . DVT (deep venous thrombosis) ([facility])    RECURRENT left leg  . Dyslipidemia   . GERD (gastroesophageal reflux  disease)   . Gout   . History of kidney stones   . Hypertension   . Ischemic cardiomyopathy 07/06/2014  . Lupus anticoagulant positive   . S/P drug eluting coronary stent placement, 07/06/14, Promus 07/07/2014  . Sleep apnea   . Subarachnoid bleed ([place]) 2015   after falling and hitting his head    Past Surgical History:  Procedure Laterality Date  . COLONOSCOPY    . EP IMPLANTABLE DEVICE N/Makayah Pauli 11/27/2014   [employer] Fortify Assura VR  implanted by Dr Rayann Heman for primary prevention  . LEFT HEART CATHETERIZATION WITH CORONARY ANGIOGRAM N/Anis Degidio 07/06/2014   Procedure: LEFT HEART CATHETERIZATION WITH CORONARY ANGIOGRAM;  Surgeon: Peter M Martinique, MD;  Location: [facility] CATH LAB;  Service: Cardiovascular;  Laterality: N/Keisuke Hollabaugh;  . PERCUTANEOUS CORONARY STENT INTERVENTION (PCI-S) Right 07/06/2014   Procedure: PERCUTANEOUS CORONARY STENT INTERVENTION (PCI-S);  Surgeon: Peter M Martinique, MD;  Location: [facility] CATH LAB;  Service: Cardiovascular;  Laterality: Right;  . SHOULDER ARTHROSCOPY WITH SUBACROMIAL DECOMPRESSION Left 03/05/2016   Procedure: SHOULDER ARTHROSCOPY ROTATOR CUFF REPAIR WITH SUBACROMIAL DECOMPRESSION;  Surgeon: Tania Ade, MD;  Location: [facility];  Service: Orthopedics;  Laterality: Left;  SHOULDER ARTHROSCOPY ROTATOR CUFF REPAIR WITH SUBACROMIAL DECOMPRESSION  . SHOULDER CLOSED REDUCTION Left 11/29/2015   Procedure: CLOSED REDUCTION SHOULDER;  Surgeon: Tania Ade, MD;  Location: [place];  Service: Orthopedics;  Laterality: Left;  . WRIST SURGERY Bilateral  plates and screws      reports that he has never smoked. He has never used smokeless tobacco. He reports that he does not drink alcohol or use drugs.  Allergies  Allergen Reactions  . No Known Allergies     Family History  Problem Relation Age of Onset  . Aneurysm Father      Prior to Admission medications   Medication Sig Start Date End Date Taking? Authorizing Provider  allopurinol (ZYLOPRIM) 300 MG tablet Take 1 tablet by  mouth daily. 02/27/16  Yes [provider]  atorvastatin (LIPITOR) 80 MG tablet Take 1 tablet (80 mg total) by mouth daily at 6 PM. 08/10/16  Yes Martinique, Peter M, MD  carvedilol (COREG) 6.25 MG tablet Take 6.25 mg by mouth 2 (two) times daily with Ceniyah Thorp meal.   Yes [provider]  cholecalciferol (VITAMIN D) 1000 units tablet Take 1,000 Units by mouth daily.    Yes [provider]  diclofenac sodium (VOLTAREN) 1 % GEL Apply 1 application topically 2 (two) times daily. 08/25/17  Yes [provider]  ENTRESTO 24-26 MG TAKE ONE TABLET BY MOUTH TWICE DAILY -TO REPLACE LISINOPRIL (START 36HOURS AFTER LAST DOSE) 07/19/17  Yes Martinique, Peter M, MD  eplerenone (INSPRA) 25 MG tablet Take 25 mg by mouth daily.   Yes [provider]  furosemide (LASIX) 40 MG tablet Take 40 mg by mouth daily.    Yes [provider]  Multiple Vitamin (MULTI-VITAMINS) TABS Take 1 tablet by mouth daily.    Yes [provider]  nitroGLYCERIN (NITROSTAT) 0.4 MG SL tablet Place 1 tablet (0.4 mg total) under the tongue every 5 (five) minutes x 3 doses as needed for chest pain. 10/09/14  Yes Martinique, Peter M, MD  pantoprazole (PROTONIX) 40 MG tablet Take 1 tablet (40 mg total) by mouth daily. 07/07/14  Yes Lendon Colonel, NP  Tamsulosin HCl (FLOMAX) 0.4 MG CAPS Take 0.4 mg by mouth daily.    Yes [provider]  warfarin (COUMADIN) 5 MG tablet Take 1 tablet (5 mg total) by mouth daily. Patient taking differently: Take 2.5-5 mg by mouth See admin instructions. Take 5 mg by mouth on Tuesday, Wednesday, Thursday, Saturday and Sunday then take 2.5 mg by mouth on Monday and Friday 10/08/16  Yes [facility] [place], [place]    Physical Exam: Vitals:   10/26/17 0530 10/26/17 0600 10/26/17 0625 10/26/17 0630  BP: 135/66 130/67 130/67 (!) 144/70  Pulse: 80 75 82 77  Resp: 18 19 19 19   Temp:      TempSrc:      SpO2: 94% 97% 97% 97%  Weight:      Height:        Constitutional: NAD,  calm, comfortable Vitals:   10/26/17 0530 10/26/17 0600 10/26/17 0625 10/26/17 0630  BP: 135/66 130/67 130/67 (!) 144/70  Pulse: 80 75 82 77  Resp: 18 19 19 19   Temp:      TempSrc:      SpO2: 94% 97% 97% 97%  Weight:      Height:       Eyes: PERRL, lids and conjunctivae normal ENMT: Mucous membranes are moist. Posterior pharynx clear of any exudate or lesions.Normal dentition.  Neck: normal, supple, no masses, no thyromegaly Respiratory: clear to auscultation bilaterally, no wheezing, no crackles. Normal respiratory effort. No accessory muscle use.  Cardiovascular: Regular rate and rhythm, no murmurs / rubs / gallops. L>R LEE. Abdomen: TTP in RLQ, no masses palpated. No hepatosplenomegaly.  Bowel sounds positive.  Musculoskeletal: no clubbing / cyanosis. No joint deformity upper and lower extremities. Good ROM, no contractures. Normal muscle tone.  Skin: no rashes, lesions, ulcers. No induration Neurologic: CN 2-12 grossly intact. Sensation intact. Strength 5/5 in all 4.  Psychiatric: Normal judgment and insight. Alert and oriented x 3. Normal mood.   Labs on Admission: I have personally reviewed following labs and imaging studies  CBC: Recent Labs  Lab 10/25/17 2349 10/26/17 0415  WBC 12.2* 10.5  HGB 14.7 13.1  HCT 42.0 38.6*  MCV 89.6 91.0  PLT 201 259   Basic Metabolic Panel: Recent Labs  Lab 10/25/17 2349 10/26/17 0415  NA 136 139  K 3.8 4.0  CL 104 108  CO2 24 22  GLUCOSE 181* 162*  BUN 29* 26*  CREATININE 1.49* 1.16  CALCIUM 9.3 8.7*   GFR: Estimated Creatinine Clearance: 73 mL/min (by C-G formula based on SCr of 1.16 mg/dL). Liver Function Tests: Recent Labs  Lab 10/25/17 2349  AST 24  ALT 21  ALKPHOS 46  BILITOT 1.4*  PROT 7.3  ALBUMIN 3.9   Recent Labs  Lab 10/25/17 2349  LIPASE 40   No results for input(s): AMMONIA in the last 168 hours. Coagulation Profile: Recent Labs  Lab 10/25/17 2349  INR 2.78   Cardiac Enzymes: Recent Labs    Lab 10/25/17 2349  TROPONINI <0.03   BNP (last 3 results) No results for input(s): PROBNP in the last 8760 hours. HbA1C: No results for input(s): HGBA1C in the last 72 hours. CBG: No results for input(s): GLUCAP in the last 168 hours. Lipid Profile: No results for input(s): CHOL, HDL, LDLCALC, TRIG, CHOLHDL, LDLDIRECT in the last 72 hours. Thyroid Function Tests: No results for input(s): TSH, T4TOTAL, FREET4, T3FREE, THYROIDAB in the last 72 hours. Anemia Panel: No results for input(s): VITAMINB12, FOLATE, FERRITIN, TIBC, IRON, RETICCTPCT in the last 72 hours. Urine analysis:    Component Value Date/Time   COLORURINE YELLOW 10/26/2017 0357   APPEARANCEUR CLOUDY (Endi Lagman) 10/26/2017 0357   LABSPEC >1.046 (H) 10/26/2017 0357   PHURINE 5.0 10/26/2017 0357   GLUCOSEU 50 (Jacquetta Polhamus) 10/26/2017 0357   HGBUR LARGE (Aspin Palomarez) 10/26/2017 0357   BILIRUBINUR NEGATIVE 10/26/2017 0357   KETONESUR 5 (Kharee Lesesne) 10/26/2017 0357   PROTEINUR 100 (Correna Meacham) 10/26/2017 0357   NITRITE NEGATIVE 10/26/2017 0357   LEUKOCYTESUR NEGATIVE 10/26/2017 0357    Radiological Exams on Admission: Dg Chest 2 View  Result Date: 10/26/2017 CLINICAL DATA:  Acute onset of nausea, vomiting and fever. EXAM: CHEST - 2 VIEW COMPARISON:  Chest radiograph performed 10/19/2015 FINDINGS: The lungs are well-aerated. Mild left basilar airspace opacity likely reflects atelectasis. There is no evidence of pleural effusion or pneumothorax. The heart is normal in size; an AICD is noted at the left chest wall, with Nickol Collister single lead ending at the right ventricle. No acute osseous abnormalities are seen. IMPRESSION: Mild left basilar airspace opacity likely reflects atelectasis; lungs otherwise clear. Electronically Signed   By: Garald Balding M.D.   On: 10/26/2017 01:41   Ct Abdomen Pelvis W Contrast  Result Date: 10/26/2017 CLINICAL DATA:  Acute onset of right lower quadrant abdominal pain, nausea and vomiting. EXAM: CT ABDOMEN AND PELVIS WITH CONTRAST TECHNIQUE:  Multidetector CT imaging of the abdomen and pelvis was performed using the standard protocol following bolus administration of intravenous contrast. CONTRAST:  88mL ISOVUE-300 IOPAMIDOL (ISOVUE-300) INJECTION 61% COMPARISON:  CT of the abdomen and pelvis performed 04/29/2001, and MRI of the lumbar spine performed  03/13/2002 FINDINGS: Lower chest: Minimal bibasilar atelectasis or scarring is noted. Hubert Raatz pacemaker lead is partially imaged. Mild wall thickening at the distal esophagus may reflect chronic inflammation. Hepatobiliary: Scattered tiny hypodensities within the liver are nonspecific but may reflect small cysts. The liver is otherwise unremarkable. The gallbladder is unremarkable. The common bile duct is normal in caliber. Pancreas: The pancreas is within normal limits. Spleen: The spleen is unremarkable in appearance. Adrenals/Urinary Tract: The adrenal glands are unremarkable in appearance. There is Tazaria Dlugosz large heterogeneous lesion arising at the interpole region of the right kidney, measuring approximately 10.5 x 9.5 x 8.4 cm, with foci of contrast blush at the superior aspect of the lesion. Given that this is in the same location as Jontavius Rabalais previously noted large simple-appearing cyst 16 years ago, this appears to reflect active hemorrhage into Elaina Cara large renal cyst. Associated blood tracks about the right kidney and inferiorly along the right paracolic gutter. 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 Underlying mild right-sided hydronephrosis is noted, with apparent obstruction of the right renal pelvis. Blood tracks about the course of the right ureter. Tanee Henery left renal cyst is noted. The left kidney is otherwise unremarkable. There is no evidence of hydronephrosis on the left. No renal or ureteral stones are identified. Stomach/Bowel: The stomach is unremarkable in appearance. The small bowel is within normal limits. The appendix is normal in caliber, without  evidence of appendicitis, seen adjacent to the inferior tip of the liver. Minimal diverticulosis is noted at the proximal sigmoid colon, without evidence of diverticulitis. 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 Scattered calcification is seen along the abdominal aorta and its branches. Blood tracks about the inferior vena cava, but the IVC is otherwise unremarkable in appearance. No retroperitoneal or pelvic sidewall lymphadenopathy is seen. Reproductive: An apparent stone is seen dependently within the bladder. The bladder is somewhat irregular in appearance, possibly reflecting chronic inflammation. The prostate is enlarged, measuring 6.2 cm in transverse dimension, with scattered calcification. Other: No additional soft tissue abnormalities are seen. Musculoskeletal: No acute osseous abnormalities are identified. Multilevel vacuum phenomenon is noted along the lumbar spine. The visualized musculature is unremarkable in appearance. IMPRESSION: 1. Large heterogeneous lesion arising at the interpole region of the right kidney, measuring 10.4 x 9.5 x 8.4 cm, with foci of contrast blush at the superior aspect of the lesion. Given that this is in the same location as Selma Rodelo previously noted large simple-appearing cyst 16 years ago, this appears to reflect active hemorrhage into Quinlee Sciarra large renal cyst. 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 Associated blood tracks about the right kidney and inferiorly along the right paracolic gutter. 2. Mild right-sided hydronephrosis, with apparent obstruction of the right renal pelvis. Blood tracks about the course of the right ureter. 3. Aneurysmal dilatation of the distal abdominal aorta to 4.0 cm in AP dimension and 4.1 cm in transverse dimension, resolving at the level of the aortic  bifurcation. Scattered calcification along the abdominal aorta and its branches. Recommend followup by ultrasound in 1 year. This recommendation follows ACR consensus guidelines: White Paper of the ACR Incidental Findings Committee II on Vascular Findings. J Am Coll Radiol 2013; 10:789-794. 4. Scattered tiny hypodensities within the liver are nonspecific but may reflect small cysts.  5. Minimal diverticulosis at the proximal sigmoid colon, without evidence of diverticulitis. 6. Mildly irregular appearance to the bladder, possibly reflecting chronic inflammation. Apparent bladder stone noted. 7. Significantly enlarged prostate.  Would correlate with PSA. 8. Mild wall thickening at the distal esophagus may reflect chronic inflammation. 9. Small left renal cyst noted. These results were called by telephone at the time of interpretation on 10/26/2017 at 1:40 am to Dr. Ezequiel Essex, who verbally acknowledged these results. Electronically Signed   By: Garald Balding M.D.   On: 10/26/2017 01:41    EKG: Independently reviewed. NSR.  Q waves in V1-V4, stable.  Appears similar to prior EKG's.   Assessment/Plan Active Problems:   Hemorrhage of kidney  Abdominal Pain  Hemorrhage into R Renal Cyst:  Abdominal pain yesterday with hemorrhagic renal cyst on imaging with compression of lower pole of R kidney (mild page kidney) and associated blood tracking about R kidney and inferiorly along the R paracolic gutter.  Also associated mild R sided hydronephrosis. On warfarin for hx of recurrent VTE.  Hemodynamically stable at this time, s/p vitamin K. H/H has downtrended after 1 L NS bolus, but appears to be dilutional as all counts have dropped With hemodynamic stability, will hold off on FFP and pRBC transfusion at this time Continue q6 hr H/H - consider FFP if hemodynamic instability or H/H downtrending Urology c/s, appreciate recommendations (will need dedicated renal CT in future to r/o underlying  malignancy)  Hematuria: 2/2 above  Urinary retention: s/p foley placement - continue flomax, consider trial without foley  Hx recurrent DVT with positive lupus anticoagulant: holding warfarin as noted above, s/p vitamin K.   Follow LE [place] given L>R LEE  CAD s/p stent: continue atorvastatin, coreg  HFrEF s/p ICD placement: EF 35-40% from 07/2017 echo.  appears euvolemic.  Continue coreg, entresto, lasix, eplerenone  BPH: continue flomax - notably with enlarged prostate on imaging, recommending correlation with PSA (will defer to urology)  Gout: allopurinol  GERD: PPI  Abdominal aortic aneurysm: needs f/u as recommended by rads  DVT prophylaxis: SCD  Code Status: full Family Communication: none at bedside, declined me calling Disposition Plan: pending improvement  Consults called: urology  Admission status: inpatient, stepdown   Fayrene Helper MD [facility] Pager [phone]  If 7PM-7AM, please contact night-coverage [email or web address] Password [email or web address]  10/26/2017, 9:26 AM

## 2017-10-26 NOTE — ED Notes (Signed)
Dr. Florene Glen paged regarding patients urinary retention. Order for foley given. 16 fr coude catheter placed at this time. 824ml of urine returned.

## 2017-10-26 NOTE — Progress Notes (Signed)
Patient's pain well controlled with oral medication. Noted to have high post-void residuals this morning (~800 mL), Foley catheter placed and draining clear yellow urine. Home Flomax restarted. Hgb 12.5 on most recent check. Remains HDS. Will repeat H/H around 5 pm.

## 2017-10-26 NOTE — ED Notes (Signed)
Patient urinated 258ml, post void residual >778 on bladder scanner.

## 2017-10-26 NOTE — ED Notes (Signed)
I have spoke with Dr. Jaclynn Guarneri Clydene Laming, he is aware of current lab results, states he will be in to see the patient in the next hour or two for further eval.

## 2017-10-26 NOTE — Consult Note (Signed)
Urology Consult Note   Requesting Attending Physician:  Veryl Speak, MD Service Providing Consult: Urology  Consulting Attending: Shirlyn Goltz, MD   Reason for Consult: likely right renal hemorrhagic cyst  HPI: Kevin Hart is seen in consultation for reasons noted above at the request of Veryl Speak, MD  This is a 72 y.o. male with multiple medical comorbidities as detailed below who presents from outside ED with RLQ pain and CT imaging that revealed a large right renal mass, likely representative of hemorrhagic cyst. Patient has prior imaging that reportedly showed large simple-appearing right renal cyst. Notably, patient is anticoagulated with warfarin for many years for his history of LE DVT. Based on the results of the CT scan, there was concern for possible active bleeding. He was transferred to [facility] ED for further evaluation.  Patient arrived afebrile and HDS. Pain controlled with medication. He reports chronic back pain which is different from his current pain. He denies fevers/chills. His nausea is resolved at this time. He is voiding volitionally without complaint. He reports urine is concentrated, but he does not believe he has gross hematuria.   Past Medical History: Past Medical History:  Diagnosis Date  . Arthritis   . BPH (benign prostatic hyperplasia)    takes Flomax daily  . CAD (coronary artery disease)    a. prior LAD stenting;  b. 06/2014 NSTEMI/PCI: LM nl, LAD 30p, 85m ISR (3.0x20 Promus DES), LCX nl, RCA nondom, nl, EF 25-30% ant, apical, dist inf AK.  . Cataracts, bilateral    immature  . Chronic systolic CHF (congestive heart failure) ([place]) 12/31/2014  . DVT (deep venous thrombosis) ([facility])    RECURRENT left leg  . Dyslipidemia   . GERD (gastroesophageal reflux disease)   . Gout   . History of kidney stones   . Hypertension   . Ischemic cardiomyopathy 07/06/2014  . Lupus anticoagulant positive   . S/P drug eluting coronary stent placement, 07/06/14,  Promus 07/07/2014  . Sleep apnea   . Subarachnoid bleed ([place]) 2015   after falling and hitting his head    Past Surgical History:  Past Surgical History:  Procedure Laterality Date  . COLONOSCOPY    . EP IMPLANTABLE DEVICE [name] 11/27/2014   [employer] Fortify Assura VR  implanted by Dr Rayann Heman for primary prevention  . LEFT HEART CATHETERIZATION WITH CORONARY ANGIOGRAM [name] 07/06/2014   Procedure: LEFT HEART CATHETERIZATION WITH CORONARY ANGIOGRAM;  Surgeon: Peter M Martinique, MD;  Location: [facility] CATH LAB;  Service: Cardiovascular;  Laterality: [name];  . PERCUTANEOUS CORONARY STENT INTERVENTION (PCI-S) Right 07/06/2014   Procedure: PERCUTANEOUS CORONARY STENT INTERVENTION (PCI-S);  Surgeon: Peter M Martinique, MD;  Location: [facility] CATH LAB;  Service: Cardiovascular;  Laterality: Right;  . SHOULDER ARTHROSCOPY WITH SUBACROMIAL DECOMPRESSION Left 03/05/2016   Procedure: SHOULDER ARTHROSCOPY ROTATOR CUFF REPAIR WITH SUBACROMIAL DECOMPRESSION;  Surgeon: Tania Ade, MD;  Location: [place];  Service: Orthopedics;  Laterality: Left;  SHOULDER ARTHROSCOPY ROTATOR CUFF REPAIR WITH SUBACROMIAL DECOMPRESSION  . SHOULDER CLOSED REDUCTION Left 11/29/2015   Procedure: CLOSED REDUCTION SHOULDER;  Surgeon: Tania Ade, MD;  Location: [place];  Service: Orthopedics;  Laterality: Left;  . WRIST SURGERY Bilateral    plates and screws     Medication: Current Facility-Administered Medications  Medication Dose Route Frequency Provider Last Rate Last Dose  . fentaNYL (SUBLIMAZE) injection 50 mcg  50 mcg Intravenous Once Ezequiel Essex, MD   Stopped at 10/26/17 [phone]  . phytonadione (VITAMIN K) 10 MG/ML SQ injection  Stopped at 10/26/17 0234   Current Outpatient Medications  Medication Sig Dispense Refill  . allopurinol (ZYLOPRIM) 300 MG tablet Take 1 tablet by mouth daily.    Marland Kitchen atorvastatin (LIPITOR) 80 MG tablet Take 1 tablet (80 mg total) by mouth daily at 6 PM. 30 tablet 6  . carvedilol (COREG) 6.25 MG  tablet Take 6.25 mg by mouth 2 (two) times daily with a meal.    . cholecalciferol (VITAMIN D) 1000 units tablet Take 1,000 Units by mouth daily.     . diclofenac sodium (VOLTAREN) 1 % GEL Apply 1 application topically 2 (two) times daily.    Marland Kitchen ENTRESTO 24-26 MG TAKE ONE TABLET BY MOUTH TWICE DAILY -TO REPLACE LISINOPRIL (START 36HOURS AFTER LAST DOSE) 60 tablet 5  . eplerenone (INSPRA) 25 MG tablet Take 25 mg by mouth daily.    . furosemide (LASIX) 40 MG tablet Take 40 mg by mouth daily.     . Multiple Vitamin (MULTI-VITAMINS) TABS Take 1 tablet by mouth daily.     . nitroGLYCERIN (NITROSTAT) 0.4 MG SL tablet Place 1 tablet (0.4 mg total) under the tongue every 5 (five) minutes x 3 doses as needed for chest pain. 25 tablet 11  . pantoprazole (PROTONIX) 40 MG tablet Take 1 tablet (40 mg total) by mouth daily. 30 tablet 2  . Tamsulosin HCl (FLOMAX) 0.4 MG CAPS Take 0.4 mg by mouth daily.     Marland Kitchen warfarin (COUMADIN) 5 MG tablet Take 1 tablet (5 mg total) by mouth daily. (Patient taking differently: Take 2.5-5 mg by mouth See admin instructions. Take 5 mg by mouth on Tuesday, Wednesday, Thursday, Saturday and Sunday then take 2.5 mg by mouth on Monday and Friday) 30 tablet 0  . lisinopril (PRINIVIL,ZESTRIL) 2.5 MG tablet Take 1 tablet (2.5 mg total) by mouth daily. (Patient not taking: Reported on 10/26/2017) 90 tablet 3    Allergies: Allergies  Allergen Reactions  . No Known Allergies     Social History: Social History   Tobacco Use  . Smoking status: Never Smoker  . Smokeless tobacco: Never Used  Substance Use Topics  . Alcohol use: No    Alcohol/week: 0.0 oz  . Drug use: No    Family History Family History  Problem Relation Age of Onset  . Aneurysm Father     Review of Systems 10 systems were reviewed and are negative except as noted specifically in the HPI.  Objective   Vital signs in last 24 hours: BP 130/67   Pulse 82   Temp 98.4 F (36.9 C) (Oral)   Resp 19   Ht 6'  (1.829 m)   Wt 111.1 kg (245 lb)   SpO2 97%   BMI 33.23 kg/m   Physical Exam General: NAD, A&O, resting, appropriate HEENT: [place]/AT, EOMI, MMM Pulmonary: Normal work of breathing Cardiovascular: HDS, adequate peripheral perfusion Abdomen: Soft, nondistended, tenderness to palpation in RLQ GU: Right CVA tenderness Extremities: warm and well perfused, some lower extremity edema Neuro: Appropriate, no focal neurological deficits  Most Recent Labs: Lab Results  Component Value Date   WBC 10.5 10/26/2017   HGB 13.1 10/26/2017   HCT 38.6 (L) 10/26/2017   PLT 190 10/26/2017    Lab Results  Component Value Date   NA 139 10/26/2017   K 4.0 10/26/2017   CL 108 10/26/2017   CO2 22 10/26/2017   BUN 26 (H) 10/26/2017   CREATININE 1.16 10/26/2017   CALCIUM 8.7 (L) 10/26/2017   MG 2.2 03/16/2015  Lab Results  Component Value Date   INR 2.78 10/25/2017     IMAGING: Dg Chest 2 View  Result Date: 10/26/2017 CLINICAL DATA:  Acute onset of nausea, vomiting and fever. EXAM: CHEST - 2 VIEW COMPARISON:  Chest radiograph performed 10/19/2015 FINDINGS: The lungs are well-aerated. Mild left basilar airspace opacity likely reflects atelectasis. There is no evidence of pleural effusion or pneumothorax. The heart is normal in size; an AICD is noted at the left chest wall, with a single lead ending at the right ventricle. No acute osseous abnormalities are seen. IMPRESSION: Mild left basilar airspace opacity likely reflects atelectasis; lungs otherwise clear. Electronically Signed   By: Garald Balding M.D.   On: 10/26/2017 01:41   Ct Abdomen Pelvis W Contrast  Result Date: 10/26/2017 CLINICAL DATA:  Acute onset of right lower quadrant abdominal pain, nausea and vomiting. EXAM: CT ABDOMEN AND PELVIS WITH CONTRAST TECHNIQUE: Multidetector CT imaging of the abdomen and pelvis was performed using the standard protocol following bolus administration of intravenous contrast. CONTRAST:  52mL ISOVUE-300  IOPAMIDOL (ISOVUE-300) INJECTION 61% COMPARISON:  CT of the abdomen and pelvis performed 04/29/2001, and MRI of the lumbar spine performed 03/13/2002 FINDINGS: Lower chest: Minimal bibasilar atelectasis or scarring is noted. A pacemaker lead is partially imaged. Mild wall thickening at the distal esophagus may reflect chronic inflammation. Hepatobiliary: Scattered tiny hypodensities within the liver are nonspecific but may reflect small cysts. The liver is otherwise unremarkable. The gallbladder is unremarkable. The common bile duct is normal in caliber. Pancreas: The pancreas is within normal limits. Spleen: The spleen is unremarkable in appearance. Adrenals/Urinary Tract: The adrenal glands are unremarkable in appearance. There is a large heterogeneous lesion arising at the interpole region of the right kidney, measuring approximately 10.5 x 9.5 x 8.4 cm, with foci of contrast blush at the superior aspect of the lesion. Given that this is in the same location as a previously noted large simple-appearing cyst 16 years ago, this appears to reflect active hemorrhage into a large renal cyst. Associated blood tracks about the right kidney and inferiorly along the right paracolic gutter. 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 Underlying mild right-sided hydronephrosis is noted, with apparent obstruction of the right renal pelvis. Blood tracks about the course of the right ureter. A left renal cyst is noted. The left kidney is otherwise unremarkable. There is no evidence of hydronephrosis on the left. No renal or ureteral stones are identified. Stomach/Bowel: The stomach is unremarkable in appearance. The small bowel is within normal limits. The appendix is normal in caliber, without evidence of appendicitis, seen adjacent to the inferior tip of the liver. Minimal diverticulosis is noted at the proximal sigmoid colon, without evidence of diverticulitis.  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 Scattered calcification is seen along the abdominal aorta and its branches. Blood tracks about the inferior vena cava, but the IVC is otherwise unremarkable in appearance. No retroperitoneal or pelvic sidewall lymphadenopathy is seen. Reproductive: An apparent stone is seen dependently within the bladder. The bladder is somewhat irregular in appearance, possibly reflecting chronic inflammation. The prostate is enlarged, measuring 6.2 cm in transverse dimension, with scattered calcification. Other: No additional soft tissue abnormalities are seen. Musculoskeletal: No acute osseous abnormalities are identified. Multilevel vacuum phenomenon is noted along the lumbar spine.  The visualized musculature is unremarkable in appearance. IMPRESSION: 1. Large heterogeneous lesion arising at the interpole region of the right kidney, measuring 10.4 x 9.5 x 8.4 cm, with foci of contrast blush at the superior aspect of the lesion. Given that this is in the same location as a previously noted large simple-appearing cyst 16 years ago, this appears to reflect active hemorrhage into a large renal cyst. 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 Associated blood tracks about the right kidney and inferiorly along the right paracolic gutter. 2. Mild right-sided hydronephrosis, with apparent obstruction of the right renal pelvis. Blood tracks about the course of the right ureter. 3. 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 Scattered calcification along the abdominal aorta and its branches. Recommend followup by ultrasound in 1 year. This recommendation follows ACR consensus guidelines:  White Paper of the ACR Incidental Findings Committee II on Vascular Findings. J Am Coll Radiol 2013; 10:789-794. 4. Scattered tiny hypodensities within the liver are nonspecific but may reflect small cysts. 5. Minimal diverticulosis at the proximal sigmoid colon, without evidence of diverticulitis. 6. Mildly irregular appearance to the bladder, possibly reflecting chronic inflammation. Apparent bladder stone noted. 7. Significantly enlarged prostate.  Would correlate with PSA. 8. Mild wall thickening at the distal esophagus may reflect chronic inflammation. 9. Small left renal cyst noted. These results were called by telephone at the time of interpretation on 10/26/2017 at 1:40 am to Dr. Ezequiel Essex, who verbally acknowledged these results. Electronically Signed   By: Garald Balding M.D.   On: 10/26/2017 01:41    ------  Assessment:  73 y.o. male with right sided abdominal pain in setting of large right renal mass, most likely representative of a hemorrhagic cyst.  Patient currently HDS. Hgb 13.1 from 14.7 after fluid bolus. Cr is WNL. Patient's pain is controlled with medication. Voiding volitionally. Urine reportedly concentrated yellow without obvious gross hematuria. UA with >50 RBCs.   Recommendations: - Medicine admission given significant cardiac comorbidities and need for anticoagulation reversal - Continue bedrest for the time being - Recommend q6 H/H throughout the day to ensure stability. If Hgb remains stable on next check, it should be fine for patient to have regular diet to day - Will obtain post-void residual to ensure emptying appropriately - Urology will continue to follow.   Thank you for this consult. Please contact the urology consult pager with any further questions/concerns.

## 2017-10-26 NOTE — ED Notes (Signed)
ED Provider at bedside. 

## 2017-10-27 DIAGNOSIS — N1330 Unspecified hydronephrosis: Secondary | ICD-10-CM

## 2017-10-27 DIAGNOSIS — I5022 Chronic systolic (congestive) heart failure: Secondary | ICD-10-CM

## 2017-10-27 DIAGNOSIS — N2889 Other specified disorders of kidney and ureter: Secondary | ICD-10-CM

## 2017-10-27 DIAGNOSIS — I82409 Acute embolism and thrombosis of unspecified deep veins of unspecified lower extremity: Secondary | ICD-10-CM

## 2017-10-27 DIAGNOSIS — N400 Enlarged prostate without lower urinary tract symptoms: Secondary | ICD-10-CM

## 2017-10-27 DIAGNOSIS — I714 Abdominal aortic aneurysm, without rupture: Secondary | ICD-10-CM

## 2017-10-27 LAB — COMPREHENSIVE METABOLIC PANEL
ALBUMIN: 3.2 g/dL — AB (ref 3.5–5.0)
ALT: 18 U/L (ref 0–44)
ANION GAP: 6 (ref 5–15)
AST: 19 U/L (ref 15–41)
Alkaline Phosphatase: 32 U/L — ABNORMAL LOW (ref 38–126)
BUN: 27 mg/dL — AB (ref 8–23)
CHLORIDE: 104 mmol/L (ref 98–111)
CO2: 30 mmol/L (ref 22–32)
Calcium: 9.2 mg/dL (ref 8.9–10.3)
Creatinine, Ser: 1.21 mg/dL (ref 0.61–1.24)
GFR calc Af Amer: >60 mL/min (ref >60)
GFR calc non Af Amer: 58 mL/min — ABNORMAL LOW (ref >60)
GLUCOSE: 122 mg/dL — AB (ref 70–99)
POTASSIUM: 4.3 mmol/L (ref 3.5–5.1)
SODIUM: 140 mmol/L (ref 135–145)
TOTAL PROTEIN: 6.4 g/dL — AB (ref 6.5–8.1)
Total Bilirubin: 1.8 mg/dL — ABNORMAL HIGH (ref 0.3–1.2)

## 2017-10-27 LAB — CBC
HEMATOCRIT: 35.3 % — AB (ref 39.0–52.0)
HEMOGLOBIN: 11.8 g/dL — AB (ref 13.0–17.0)
MCH: 30.9 pg (ref 26.0–34.0)
MCHC: 33.4 g/dL (ref 30.0–36.0)
MCV: 92.4 fL (ref 78.0–100.0)
Platelets: 194 10*3/uL (ref 150–400)
RBC: 3.82 MIL/uL — ABNORMAL LOW (ref 4.22–5.81)
RDW: 15.1 % (ref 11.5–15.5)
WBC: 9.4 10*3/uL (ref 4.0–10.5)

## 2017-10-27 LAB — HEMOGLOBIN AND HEMATOCRIT, BLOOD
HEMATOCRIT: 36.5 % — AB (ref 39.0–52.0)
Hemoglobin: 12 g/dL — ABNORMAL LOW (ref 13.0–17.0)

## 2017-10-27 LAB — PROTIME-INR
INR: 1.34
PROTHROMBIN TIME: 16.5 s — AB (ref 11.4–15.2)

## 2017-10-27 LAB — MAGNESIUM: Magnesium: 2.2 mg/dL (ref 1.7–2.4)

## 2017-10-27 MED ORDER — TAMSULOSIN HCL 0.4 MG PO CAPS
0.8000 mg | ORAL_CAPSULE | Freq: Every day | ORAL | Status: DC
  Administered 2017-10-27 – 2017-10-29 (×3): 0.8 mg via ORAL
  Filled 2017-10-27 (×3): qty 2

## 2017-10-27 MED ORDER — CHLORHEXIDINE GLUCONATE 0.12 % MT SOLN
15.0000 mL | Freq: Two times a day (BID) | OROMUCOSAL | Status: DC
  Administered 2017-10-27 – 2017-10-29 (×5): 15 mL via OROMUCOSAL
  Filled 2017-10-27 (×5): qty 15

## 2017-10-27 MED ORDER — ORAL CARE MOUTH RINSE
15.0000 mL | Freq: Two times a day (BID) | OROMUCOSAL | Status: DC
  Administered 2017-10-27: 15 mL via OROMUCOSAL

## 2017-10-27 NOTE — Progress Notes (Signed)
Urology Progress Note   Subjective/Interval Events: NAEON AFVSS UOP adequate, clear yellow, Cr 1.21 Hgb 11.8 this am (12.2 last night) Pain improved today  Past Medical History: Past Medical History:  Diagnosis Date  . Arthritis   . Bladder stone 10/26/2017  . BPH (benign prostatic hyperplasia)    takes Flomax daily  . CAD (coronary artery disease)    a. prior LAD stenting;  b. 06/2014 NSTEMI/PCI: LM nl, LAD 30p, 64m ISR (3.0x20 Promus DES), LCX nl, RCA nondom, nl, EF 25-30% ant, apical, dist inf AK.  . Cataracts, bilateral    immature  . Chronic systolic CHF (congestive heart failure) ([place]) 12/31/2014  . DVT (deep venous thrombosis) ([facility])    RECURRENT left leg  . Dyslipidemia   . GERD (gastroesophageal reflux disease)   . Gout   . History of kidney stones   . Hypertension   . Ischemic cardiomyopathy 07/06/2014  . Lupus anticoagulant positive   . S/P drug eluting coronary stent placement, 07/06/14, Promus 07/07/2014  . Sleep apnea   . Subarachnoid bleed ([place]) 2015   after falling and hitting his head    Past Surgical History:  Past Surgical History:  Procedure Laterality Date  . COLONOSCOPY    . EP IMPLANTABLE DEVICE [name] 11/27/2014   [employer] Fortify Assura VR  implanted by Dr Rayann Heman for primary prevention  . LEFT HEART CATHETERIZATION WITH CORONARY ANGIOGRAM [name] 07/06/2014   Procedure: LEFT HEART CATHETERIZATION WITH CORONARY ANGIOGRAM;  Surgeon: Peter M Martinique, MD;  Location: [facility] CATH LAB;  Service: Cardiovascular;  Laterality: [name];  . PERCUTANEOUS CORONARY STENT INTERVENTION (PCI-S) Right 07/06/2014   Procedure: PERCUTANEOUS CORONARY STENT INTERVENTION (PCI-S);  Surgeon: Peter M Martinique, MD;  Location: [facility] CATH LAB;  Service: Cardiovascular;  Laterality: Right;  . SHOULDER ARTHROSCOPY WITH SUBACROMIAL DECOMPRESSION Left 03/05/2016   Procedure: SHOULDER ARTHROSCOPY ROTATOR CUFF REPAIR WITH SUBACROMIAL DECOMPRESSION;  Surgeon: Tania Ade, MD;  Location: [place];   Service: Orthopedics;  Laterality: Left;  SHOULDER ARTHROSCOPY ROTATOR CUFF REPAIR WITH SUBACROMIAL DECOMPRESSION  . SHOULDER CLOSED REDUCTION Left 11/29/2015   Procedure: CLOSED REDUCTION SHOULDER;  Surgeon: Tania Ade, MD;  Location: [place];  Service: Orthopedics;  Laterality: Left;  . WRIST SURGERY Bilateral    plates and screws     Medication: Current Facility-Administered Medications  Medication Dose Route Frequency Provider Last Rate Last Dose  . acetaminophen (TYLENOL) tablet 650 mg  650 mg Oral Q6H PRN Elodia Florence., MD       Or  . acetaminophen (TYLENOL) suppository 650 mg  650 mg Rectal Q6H PRN Elodia Florence., MD      . allopurinol (ZYLOPRIM) tablet 300 mg  300 mg Oral Daily Elodia Florence., MD   300 mg at 10/26/17 1704  . atorvastatin (LIPITOR) tablet 80 mg  80 mg Oral q1800 Elodia Florence., MD   80 mg at 10/26/17 1704  . carvedilol (COREG) tablet 6.25 mg  6.25 mg Oral BID WC Elodia Florence., MD   6.25 mg at 10/26/17 1704  . chlorhexidine (PERIDEX) 0.12 % solution 15 mL  15 mL Mouth Rinse BID Elodia Florence., MD      . cholecalciferol (VITAMIN D) tablet 1,000 Units  1,000 Units Oral Daily Elodia Florence., MD      . diclofenac sodium (VOLTAREN) 1 % transdermal gel 1 application  1 application Topical BID Elodia Florence., MD   1 application at 65/03/54 2029  .  fentaNYL (SUBLIMAZE) injection 50 mcg  50 mcg Intravenous Once Elodia Florence., MD   Stopped at 10/26/17 [phone]  . fentaNYL (SUBLIMAZE) injection 50 mcg  50 mcg Intravenous Q2H PRN Elodia Florence., MD      . furosemide (LASIX) tablet 40 mg  40 mg Oral Daily Elodia Florence., MD   40 mg at 10/26/17 1112  . MEDLINE mouth rinse  15 mL Mouth Rinse q12n4p Elodia Florence., MD      . multivitamin with minerals tablet 1 tablet  1 tablet Oral Daily Elodia Florence., MD      . nitroGLYCERIN (NITROSTAT) SL tablet 0.4 mg  0.4 mg Sublingual Q5 Min x  3 PRN Elodia Florence., MD      . ondansetron [facility]) tablet 4 mg  4 mg Oral Q6H PRN Elodia Florence., MD       Or  . ondansetron [facility]) injection 4 mg  4 mg Intravenous Q6H PRN Elodia Florence., MD      . oxyCODONE (Oxy IR/ROXICODONE) immediate release tablet 5 mg  5 mg Oral Q4H PRN Elodia Florence., MD   5 mg at 10/26/17 2028  . pantoprazole (PROTONIX) EC tablet 40 mg  40 mg Oral Daily Elodia Florence., MD   40 mg at 10/26/17 1112  . polyethylene glycol (MIRALAX / GLYCOLAX) packet 17 g  17 g Oral Daily Elodia Florence., MD      . sacubitril-valsartan [facility]) 24-26 mg per tablet  1 tablet Oral BID Elodia Florence., MD   1 tablet at 10/26/17 2028  . spironolactone (ALDACTONE) tablet 25 mg  25 mg Oral Daily Elodia Florence., MD      . tamsulosin [facility]) capsule 0.4 mg  0.4 mg Oral Daily Elodia Florence., MD   0.4 mg at 10/26/17 1112    Allergies: Allergies  Allergen Reactions  . No Known Allergies     Social History: Social History   Tobacco Use  . Smoking status: Never Smoker  . Smokeless tobacco: Never Used  Substance Use Topics  . Alcohol use: No    Alcohol/week: 0.0 oz  . Drug use: No    Family History Family History  Problem Relation Age of Onset  . Aneurysm Father     Review of Systems 10 systems were reviewed and are negative except as noted specifically in the HPI.  Objective   Vital signs in last 24 hours: BP 111/61   Pulse 89   Temp 97.6 F (36.4 C) (Oral)   Resp 17   Ht 6' (1.829 m)   Wt 114.3 kg (251 lb 15.8 oz)   SpO2 96%   BMI 34.18 kg/m   Physical Exam General: NAD, A&O, resting, appropriate HEENT: [place]/AT, EOMI, MMM Pulmonary: Normal work of breathing Cardiovascular: HDS, adequate peripheral perfusion Abdomen: Soft, nondistended, tenderness to deep palpation in RLQ GU: No CVAT, Foley catheter draining clear yellow urine Extremities: warm and well perfused, some lower extremity  edema Neuro: Appropriate, no focal neurological deficits  Most Recent Labs: Lab Results  Component Value Date   WBC 9.4 10/27/2017   HGB 11.8 (L) 10/27/2017   HCT 35.3 (L) 10/27/2017   PLT 194 10/27/2017    Lab Results  Component Value Date   NA 140 10/27/2017   K 4.3 10/27/2017   CL 104 10/27/2017   CO2 30 10/27/2017   BUN 27 (H)  10/27/2017   CREATININE 1.21 10/27/2017   CALCIUM 9.2 10/27/2017   MG 2.2 10/27/2017    Lab Results  Component Value Date   INR 1.34 10/27/2017     IMAGING: Dg Chest 2 View  Result Date: 10/26/2017 CLINICAL DATA:  Acute onset of nausea, vomiting and fever. EXAM: CHEST - 2 VIEW COMPARISON:  Chest radiograph performed 10/19/2015 FINDINGS: The lungs are well-aerated. Mild left basilar airspace opacity likely reflects atelectasis. There is no evidence of pleural effusion or pneumothorax. The heart is normal in size; an AICD is noted at the left chest wall, with a single lead ending at the right ventricle. No acute osseous abnormalities are seen. IMPRESSION: Mild left basilar airspace opacity likely reflects atelectasis; lungs otherwise clear. Electronically Signed   By: Garald Balding M.D.   On: 10/26/2017 01:41   Ct Abdomen Pelvis W Contrast  Result Date: 10/26/2017 CLINICAL DATA:  Acute onset of right lower quadrant abdominal pain, nausea and vomiting. EXAM: CT ABDOMEN AND PELVIS WITH CONTRAST TECHNIQUE: Multidetector CT imaging of the abdomen and pelvis was performed using the standard protocol following bolus administration of intravenous contrast. CONTRAST:  23mL ISOVUE-300 IOPAMIDOL (ISOVUE-300) INJECTION 61% COMPARISON:  CT of the abdomen and pelvis performed 04/29/2001, and MRI of the lumbar spine performed 03/13/2002 FINDINGS: Lower chest: Minimal bibasilar atelectasis or scarring is noted. A pacemaker lead is partially imaged. Mild wall thickening at the distal esophagus may reflect chronic inflammation. Hepatobiliary: Scattered tiny hypodensities  within the liver are nonspecific but may reflect small cysts. The liver is otherwise unremarkable. The gallbladder is unremarkable. The common bile duct is normal in caliber. Pancreas: The pancreas is within normal limits. Spleen: The spleen is unremarkable in appearance. Adrenals/Urinary Tract: The adrenal glands are unremarkable in appearance. There is a large heterogeneous lesion arising at the interpole region of the right kidney, measuring approximately 10.5 x 9.5 x 8.4 cm, with foci of contrast blush at the superior aspect of the lesion. Given that this is in the same location as a previously noted large simple-appearing cyst 16 years ago, this appears to reflect active hemorrhage into a large renal cyst. Associated blood tracks about the right kidney and inferiorly along the right paracolic gutter. 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 Underlying mild right-sided hydronephrosis is noted, with apparent obstruction of the right renal pelvis. Blood tracks about the course of the right ureter. A left renal cyst is noted. The left kidney is otherwise unremarkable. There is no evidence of hydronephrosis on the left. No renal or ureteral stones are identified. Stomach/Bowel: The stomach is unremarkable in appearance. The small bowel is within normal limits. The appendix is normal in caliber, without evidence of appendicitis, seen adjacent to the inferior tip of the liver. Minimal diverticulosis is noted at the proximal sigmoid colon, without evidence of diverticulitis. 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 Scattered calcification is seen along the abdominal aorta and its branches. Blood tracks about the inferior vena cava, but the IVC is otherwise unremarkable in appearance. No retroperitoneal or pelvic  sidewall lymphadenopathy is seen. Reproductive: An apparent stone is seen dependently within the bladder. The bladder is somewhat irregular in appearance, possibly reflecting chronic inflammation. The prostate is enlarged, measuring 6.2 cm in transverse dimension, with scattered calcification. Other: No additional soft tissue  abnormalities are seen. Musculoskeletal: No acute osseous abnormalities are identified. Multilevel vacuum phenomenon is noted along the lumbar spine. The visualized musculature is unremarkable in appearance. IMPRESSION: 1. Large heterogeneous lesion arising at the interpole region of the right kidney, measuring 10.4 x 9.5 x 8.4 cm, with foci of contrast blush at the superior aspect of the lesion. Given that this is in the same location as a previously noted large simple-appearing cyst 16 years ago, this appears to reflect active hemorrhage into a large renal cyst. 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 Associated blood tracks about the right kidney and inferiorly along the right paracolic gutter. 2. Mild right-sided hydronephrosis, with apparent obstruction of the right renal pelvis. Blood tracks about the course of the right ureter. 3. 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 Scattered calcification along the abdominal aorta and its branches. Recommend followup by ultrasound in 1 year. This recommendation follows ACR consensus guidelines: White Paper of the ACR Incidental Findings Committee II on Vascular Findings. J Am Coll Radiol 2013; 10:789-794. 4. Scattered tiny hypodensities within the liver are nonspecific but may reflect small cysts. 5. Minimal diverticulosis at the proximal sigmoid colon, without evidence of diverticulitis. 6. Mildly irregular appearance to the bladder, possibly reflecting chronic inflammation. Apparent  bladder stone noted. 7. Significantly enlarged prostate.  Would correlate with PSA. 8. Mild wall thickening at the distal esophagus may reflect chronic inflammation. 9. Small left renal cyst noted. These results were called by telephone at the time of interpretation on 10/26/2017 at 1:40 am to Dr. Ezequiel Essex, who verbally acknowledged these results. Electronically Signed   By: Garald Balding M.D.   On: 10/26/2017 01:41    ------  Assessment:  72 y.o. male with right sided abdominal pain in setting of large right renal mass, most likely representative of a hemorrhagic cyst. Patient has remained stable.   Recommendations: - Continue to monitor Hgb. - Discontinue bed rest, no straining or heavy lifting. - Will increase tamsulosin to 0.8mg  and consider TOV tomorrow morning  - Urology will continue to follow.  Thank you for this consult. Please contact the urology consult pager with any further questions/concerns.

## 2017-10-27 NOTE — Progress Notes (Signed)
PROGRESS NOTE    Kevin Hart  YNW:[id number] DOB: 1945-11-14 DOA: 10/25/2017 PCP: Guadlupe Spanish, MD   Brief Narrative:  HPI on 10/26/2017 by Dr. Fayrene Helper Kevin Hart is a 72 y.o. 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  He notes that his pain started yesterday sometime during the day.  You describes it as tremendous pain in his right lower abdomen.  He was appendicitis.  They were planning on seeing his doctor today, but around 10:30 at night he could not stand the pain anymore.  He describes the pain as grabbing and squeezing.  He describes it as constant.  Nothing made it worse, but pain meds made it better.  He does not note any fevers.  He did note some nausea and vomiting.  He denies any chills or chest pain.  He denies any dysuria.  He notes that his urine was brown in the emergency department, but did not note any bright red blood.   Interim history Admitted for abdominal pain and noted to have a hemorrhagic right renal cyst.  Urology consulted. Assessment & Plan   Abdominal pain secondary to right hemorrhagic renal cyst -CT abdomen pelvis showed large heterogeneous lesion arising at the interpolar region of the right kidney, 10.4 x 9.5 x 8.4 cm (same location of simple appearing cysts 16 years ago); associated blood tracts about the right kidney and inferiorly along the right pericolic gutter -Patient has been on Coumadin for recurrent VTE.  Patient did receive vitamin K. -Currently hemoglobin has remained stable -Urology consulted and appreciated, pending recommendations  Hematuria -Secondary to the above  Urinary retention with right-sided hydronephrosis/BPH -Currently has Foley catheter in place, started on  Flomax -Imaging noted enlarged prostate -Pending further recommendations from urology  History of DVT with positive lupus anticoagulant -Coumadin held due to the above -Patient was given vitamin K -Lower extremity Doppler negative for DVT  Coronary artery disease -Status post stent placement -Currently chest pain-free -Continue statin, Coreg  Chronic systolic heart failure -Status post ICD placement -appears to be euvolemic and compensated -Echocardiogram 08/18/2017 shows an EF of 35 to 65%, grade 1 diastolic dysfunction.  Anterior, anterior septal, apical, inferior apical hypokinesis -Continue Lasix, Entresto, Coreg, eplerenone -Monitor intake and output, daily weights  Gout -Currently appears to be stable, continue allopurinol  GERD -Continue PPI  Abdominal aortic aneurysm -Noted on CT abdomen pelvis, 4 cm in AP diameter, 4.1 cm in transverse dimension -will need follow-up with ultrasound in 1 year  DVT Prophylaxis  SCDs  Code Status: Full  Family Communication: None at bedside  Disposition Plan: Admitted, pending further recommendations from urology. Suspect home when medically stable  Consultants Urology  Procedures  Lower extremity doppler  Antibiotics   Anti-infectives (From admission, onward)   None      Subjective:   Kevin Hart seen and examined today.  Feels abdominal pain has improved.  Still has some pain or soreness particularly on the right.  Currently denies any chest pain, shortness of breath, nausea or vomiting, diarrhea constipation, dizziness or headache.   Objective:   Vitals:   10/27/17 0428 10/27/17 0500 10/27/17 0700 10/27/17 0800  BP:  (!) 104/51 111/61 (!) 130/50  Pulse:  67 89 91  Resp:  (!) 8 17 (!) 21  Temp:  98.6 F (37 C)  TempSrc:    Oral  SpO2:  95% 96% 96%  Weight: 114.3 kg (251 lb 15.8 oz)     Height:        Intake/Output Summary (Last 24 hours) at 10/27/2017 0949 Last data filed at 10/27/2017 0551 Gross per 24  hour  Intake -  Output 2975 ml  Net -2975 ml   Filed Weights   10/25/17 2324 10/26/17 1322 10/27/17 0428  Weight: 111.1 kg (245 lb) 113.4 kg (250 lb) 114.3 kg (251 lb 15.8 oz)    Exam  General: Well developed, well nourished, NAD, appears stated age  HEENT: NCAT, mucous membranes moist.   Neck: Supple  Cardiovascular: S1 S2 auscultated, no rubs, murmurs or gallops. Regular rate and rhythm.  Respiratory: Clear to auscultation bilaterally with equal chest rise  Abdomen: Soft, mildly RLQ TTP, nondistended, + bowel sounds  Extremities: warm dry without cyanosis clubbing. LE edema L >R  Neuro: AAOx3, nonfocal  Psych: Normal affect and demeanor with intact judgement and insight   Data Reviewed: I have personally reviewed following labs and imaging studies  CBC: Recent Labs  Lab 10/25/17 2349 10/26/17 0415 10/26/17 1117 10/26/17 1705 10/26/17 2249 10/27/17 0549  WBC 12.2* 10.5  --   --   --  9.4  HGB 14.7 13.1 12.5* 12.7* 12.2* 11.8*  HCT 42.0 38.6* 37.1* 37.5* 35.8* 35.3*  MCV 89.6 91.0  --   --   --  92.4  PLT 201 190  --   --   --  034   Basic Metabolic Panel: Recent Labs  Lab 10/25/17 2349 10/26/17 0415 10/27/17 0549  NA 136 139 140  K 3.8 4.0 4.3  CL 104 108 104  CO2 24 22 30   GLUCOSE 181* 162* 122*  BUN 29* 26* 27*  CREATININE 1.49* 1.16 1.21  CALCIUM 9.3 8.7* 9.2  MG  --   --  2.2   GFR: Estimated Creatinine Clearance: 71 mL/min (by C-G formula based on SCr of 1.21 mg/dL). Liver Function Tests: Recent Labs  Lab 10/25/17 2349 10/27/17 0549  AST 24 19  ALT 21 18  ALKPHOS 46 32*  BILITOT 1.4* 1.8*  PROT 7.3 6.4*  ALBUMIN 3.9 3.2*   Recent Labs  Lab 10/25/17 2349  LIPASE 40   No results for input(s): AMMONIA in the last 168 hours. Coagulation Profile: Recent Labs  Lab 10/25/17 2349 10/27/17 0549  INR 2.78 1.34   Cardiac Enzymes: Recent Labs  Lab 10/25/17 2349  TROPONINI <0.03   BNP (last 3 results) No results for input(s):  PROBNP in the last 8760 hours. HbA1C: No results for input(s): HGBA1C in the last 72 hours. CBG: No results for input(s): GLUCAP in the last 168 hours. Lipid Profile: No results for input(s): CHOL, HDL, LDLCALC, TRIG, CHOLHDL, LDLDIRECT in the last 72 hours. Thyroid Function Tests: No results for input(s): TSH, T4TOTAL, FREET4, T3FREE, THYROIDAB in the last 72 hours. Anemia Panel: No results for input(s): VITAMINB12, FOLATE, FERRITIN, TIBC, IRON, RETICCTPCT in the last 72 hours. Urine analysis:    Component Value Date/Time   COLORURINE YELLOW 10/26/2017 0357   APPEARANCEUR CLOUDY (A) 10/26/2017 0357   LABSPEC >1.046 (H) 10/26/2017 0357   PHURINE 5.0 10/26/2017 0357   GLUCOSEU 50 (A) 10/26/2017 0357   HGBUR LARGE (A) 10/26/2017 0357   BILIRUBINUR NEGATIVE 10/26/2017 0357   KETONESUR 5 (A) 10/26/2017 0357   PROTEINUR 100 (A) 10/26/2017 0357   NITRITE NEGATIVE 10/26/2017 0357   LEUKOCYTESUR  NEGATIVE 10/26/2017 0357   Sepsis Labs: @LABRCNTIP (procalcitonin:4,lacticidven:4)  ) Recent Results (from the past 240 hour(s))  MRSA PCR Screening     Status: None   Collection Time: 10/26/17  1:24 PM  Result Value Ref Range Status   MRSA by PCR NEGATIVE NEGATIVE Final    Comment:        The GeneXpert MRSA Assay (FDA approved for NASAL specimens only), is one component of a comprehensive MRSA colonization surveillance program. It is not intended to diagnose MRSA infection nor to guide or monitor treatment for MRSA infections. Performed at [facility], [facility], [place] [address] [place], [place] [address]       Radiology Studies: Dg Chest 2 View  Result Date: 10/26/2017 CLINICAL DATA:  Acute onset of nausea, vomiting and fever. EXAM: CHEST - 2 VIEW COMPARISON:  Chest radiograph performed 10/19/2015 FINDINGS: The lungs are well-aerated. Mild left basilar airspace opacity likely reflects atelectasis. There is no evidence of pleural effusion or pneumothorax. The heart is  normal in size; an AICD is noted at the left chest wall, with a single lead ending at the right ventricle. No acute osseous abnormalities are seen. IMPRESSION: Mild left basilar airspace opacity likely reflects atelectasis; lungs otherwise clear. Electronically Signed   By: Garald Balding M.D.   On: 10/26/2017 01:41   Ct Abdomen Pelvis W Contrast  Result Date: 10/26/2017 CLINICAL DATA:  Acute onset of right lower quadrant abdominal pain, nausea and vomiting. EXAM: CT ABDOMEN AND PELVIS WITH CONTRAST TECHNIQUE: Multidetector CT imaging of the abdomen and pelvis was performed using the standard protocol following bolus administration of intravenous contrast. CONTRAST:  34mL ISOVUE-300 IOPAMIDOL (ISOVUE-300) INJECTION 61% COMPARISON:  CT of the abdomen and pelvis performed 04/29/2001, and MRI of the lumbar spine performed 03/13/2002 FINDINGS: Lower chest: Minimal bibasilar atelectasis or scarring is noted. A pacemaker lead is partially imaged. Mild wall thickening at the distal esophagus may reflect chronic inflammation. Hepatobiliary: Scattered tiny hypodensities within the liver are nonspecific but may reflect small cysts. The liver is otherwise unremarkable. The gallbladder is unremarkable. The common bile duct is normal in caliber. Pancreas: The pancreas is within normal limits. Spleen: The spleen is unremarkable in appearance. Adrenals/Urinary Tract: The adrenal glands are unremarkable in appearance. There is a large heterogeneous lesion arising at the interpole region of the right kidney, measuring approximately 10.5 x 9.5 x 8.4 cm, with foci of contrast blush at the superior aspect of the lesion. Given that this is in the same location as a previously noted large simple-appearing cyst 16 years ago, this appears to reflect active hemorrhage into a large renal cyst. Associated blood tracks about the right kidney and inferiorly along the right paracolic gutter. 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 Underlying mild right-sided hydronephrosis is noted, with apparent obstruction of the right renal pelvis. Blood tracks about the course of the right ureter. A left renal cyst is noted. The left kidney is otherwise unremarkable. There is no evidence of hydronephrosis on the left. No renal or ureteral stones are identified. Stomach/Bowel: The stomach is unremarkable in appearance. The small bowel is within normal limits. The appendix is normal in caliber, without evidence of appendicitis, seen adjacent to the inferior tip of the liver. Minimal diverticulosis is noted at the proximal sigmoid colon, without evidence of diverticulitis. Vascular/Lymphatic: There is aneurysmal dilatation of the distal abdominal aorta to 4.0 cm in AP dimension and 4.1 cm in  transverse dimension, resolving at the level of the aortic bifurcation. Scattered calcification is seen along the abdominal aorta and its branches. Blood tracks about the inferior vena cava, but the IVC is otherwise unremarkable in appearance. No retroperitoneal or pelvic sidewall lymphadenopathy is seen. Reproductive: An apparent stone is seen dependently within the bladder. The bladder is somewhat irregular in appearance, possibly reflecting chronic inflammation. The prostate is enlarged, measuring 6.2 cm in transverse dimension, with scattered calcification. Other: No additional soft tissue abnormalities are seen. Musculoskeletal: No acute osseous abnormalities are identified. Multilevel vacuum phenomenon is noted along the lumbar spine. The visualized musculature is unremarkable in appearance. IMPRESSION: 1. Large heterogeneous lesion arising at the interpole region of the right kidney, measuring 10.4 x 9.5 x 8.4 cm, with foci of contrast blush at the superior aspect of the lesion. Given that this is in the same location as a previously noted large simple-appearing cyst 16 years ago, this appears to reflect active  hemorrhage into a large renal cyst. 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 Associated blood tracks about the right kidney and inferiorly along the right paracolic gutter. 2. Mild right-sided hydronephrosis, with apparent obstruction of the right renal pelvis. Blood tracks about the course of the right ureter. 3. 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 Scattered calcification along the abdominal aorta and its branches. Recommend followup by ultrasound in 1 year. This recommendation follows ACR consensus guidelines: White Paper of the ACR Incidental Findings Committee II on Vascular Findings. J Am Coll Radiol 2013; 10:789-794. 4. Scattered tiny hypodensities within the liver are nonspecific but may reflect small cysts. 5. Minimal diverticulosis at the proximal sigmoid colon, without evidence of diverticulitis. 6. Mildly irregular appearance to the bladder, possibly reflecting chronic inflammation. Apparent bladder stone noted. 7. Significantly enlarged prostate.  Would correlate with PSA. 8. Mild wall thickening at the distal esophagus may reflect chronic inflammation. 9. Small left renal cyst noted. These results were called by telephone at the time of interpretation on 10/26/2017 at 1:40 am to Dr. Ezequiel Essex, who verbally acknowledged these results. Electronically Signed   By: Garald Balding M.D.   On: 10/26/2017 01:41     Scheduled Meds: . allopurinol  300 mg Oral Daily  . atorvastatin  80 mg Oral q1800  . carvedilol  6.25 mg Oral BID WC  . chlorhexidine  15 mL Mouth Rinse BID  . cholecalciferol  1,000 Units Oral Daily  . diclofenac sodium  1 application Topical BID  . fentaNYL (SUBLIMAZE) injection  50 mcg Intravenous Once  . furosemide  40 mg Oral Daily  . mouth rinse  15 mL Mouth Rinse q12n4p  . multivitamin with minerals  1  tablet Oral Daily  . pantoprazole  40 mg Oral Daily  . polyethylene glycol  17 g Oral Daily  . sacubitril-valsartan  1 tablet Oral BID  . spironolactone  25 mg Oral Daily  . tamsulosin  0.8 mg Oral Daily   Continuous Infusions:   LOS: 1 day   Time Spent in minutes   30 minutes  Caydon Feasel D.O. on 10/27/2017 at 9:49 AM  Between 7am to 7pm - Pager - [phone]  After 7pm go to [email or web address] - password [email or web address]  And look for the night coverage person covering for me after hours  [facility] Office  [phone]

## 2017-10-28 LAB — CBC
HCT: 33.7 % — ABNORMAL LOW (ref 39.0–52.0)
Hemoglobin: 11.4 g/dL — ABNORMAL LOW (ref 13.0–17.0)
MCH: 30.7 pg (ref 26.0–34.0)
MCHC: 33.8 g/dL (ref 30.0–36.0)
MCV: 90.8 fL (ref 78.0–100.0)
Platelets: 200 10*3/uL (ref 150–400)
RBC: 3.71 MIL/uL — ABNORMAL LOW (ref 4.22–5.81)
RDW: 14.5 % (ref 11.5–15.5)
WBC: 8.4 10*3/uL (ref 4.0–10.5)

## 2017-10-28 LAB — BASIC METABOLIC PANEL
ANION GAP: 7 (ref 5–15)
BUN: 22 mg/dL (ref 8–23)
CALCIUM: 8.8 mg/dL — AB (ref 8.9–10.3)
CO2: 28 mmol/L (ref 22–32)
Chloride: 103 mmol/L (ref 98–111)
Creatinine, Ser: 1 mg/dL (ref 0.61–1.24)
Glucose, Bld: 133 mg/dL — ABNORMAL HIGH (ref 70–99)
POTASSIUM: 4.1 mmol/L (ref 3.5–5.1)
Sodium: 138 mmol/L (ref 135–145)

## 2017-10-28 NOTE — Care Management Note (Signed)
Case Management Note  Patient Details  Name: Kevin Hart MRN: [id number] Date of Birth: Feb 02, 1946  Subjective/Objective:   Admitted with rt sided abd pain, ct scan of kidney shows rt large bleeding lesion hgb stable recent hx of anticoagulant therapy for dvt-being held.                 Action/Plan:  Plan is to transfer to floor to today and possible dc to home tomorrow/no hhc needs present at this time.   Expected Discharge Date:  (unknown)               Expected Discharge Plan:  Home/Self Care  In-House Referral:     Discharge planning Services  CM Consult  Post Acute Care Choice:    Choice offered to:     DME Arranged:    DME Agency:     HH Arranged:    [place] Agency:     Status of Service:  Completed, signed off  If discussed at [employer] of Stay Meetings, dates discussed:    Additional Comments:  Leeroy Cha, RN 10/28/2017, 11:26 AM

## 2017-10-28 NOTE — Progress Notes (Signed)
ICU tried to call report to 5E, but RN busy with another pt. 5E RN planning to call back within a few minutes.

## 2017-10-28 NOTE — Progress Notes (Addendum)
Urology Progress Note  Assessment:  72 y.o. male with right sided abdominal pain in setting of large right renal mass, most likely representative of a hemorrhagic cyst. Patient has remained stable.   Recommendations: - Encourage ambulation today, no straining or heavy lifting. - Continue tamsulosin at 0.8mg . Order placed to removed Foley for TOV. Obtain post-void residual - Anticipate patient will be suitable for discharge tomorrow - Would wait until first of next week to resume warfarin - Consider sending patient home with SCDs until he is again on therapeutic anticoagulation. - Urology will plan for outpatient follow up with reimaging in 4-6 weeks.  Thank you for this consult. Please contact the urology consult pager with any further questions/concerns.  Subjective/Interval Events: NAEON AFVSS UOP adequate, clear yellow, Cr 1.00 Hgb 11.4 this am  Past Medical History: Past Medical History:  Diagnosis Date  . Arthritis   . Bladder stone 10/26/2017  . BPH (benign prostatic hyperplasia)    takes Flomax daily  . CAD (coronary artery disease)    a. prior LAD stenting;  b. 06/2014 NSTEMI/PCI: LM nl, LAD 30p, 54m ISR (3.0x20 Promus DES), LCX nl, RCA nondom, nl, EF 25-30% ant, apical, dist inf AK.  . Cataracts, bilateral    immature  . Chronic systolic CHF (congestive heart failure) ([place]) 12/31/2014  . DVT (deep venous thrombosis) ([facility])    RECURRENT left leg  . Dyslipidemia   . GERD (gastroesophageal reflux disease)   . Gout   . History of kidney stones   . Hypertension   . Ischemic cardiomyopathy 07/06/2014  . Lupus anticoagulant positive   . S/P drug eluting coronary stent placement, 07/06/14, Promus 07/07/2014  . Sleep apnea   . Subarachnoid bleed ([place]) 2015   after falling and hitting his head    Past Surgical History:  Past Surgical History:  Procedure Laterality Date  . COLONOSCOPY    . EP IMPLANTABLE DEVICE [name] 11/27/2014   [employer] Fortify Assura VR   implanted by Dr Rayann Heman for primary prevention  . LEFT HEART CATHETERIZATION WITH CORONARY ANGIOGRAM [name] 07/06/2014   Procedure: LEFT HEART CATHETERIZATION WITH CORONARY ANGIOGRAM;  Surgeon: Peter M Martinique, MD;  Location: [facility] CATH LAB;  Service: Cardiovascular;  Laterality: [name];  . PERCUTANEOUS CORONARY STENT INTERVENTION (PCI-S) Right 07/06/2014   Procedure: PERCUTANEOUS CORONARY STENT INTERVENTION (PCI-S);  Surgeon: Peter M Martinique, MD;  Location[facility] CATH LAB;  Service: Cardiovascular;  Laterality: Right;  . SHOULDER ARTHROSCOPY WITH SUBACROMIAL DECOMPRESSION Left 03/05/2016   Procedure: SHOULDER ARTHROSCOPY ROTATOR CUFF REPAIR WITH SUBACROMIAL DECOMPRESSION;  Surgeon: Tania Ade, MD;  Location: [place];  Service: Orthopedics;  Laterality: Left;  SHOULDER ARTHROSCOPY ROTATOR CUFF REPAIR WITH SUBACROMIAL DECOMPRESSION  . SHOULDER CLOSED REDUCTION Left 11/29/2015   Procedure: CLOSED REDUCTION SHOULDER;  Surgeon: Tania Ade, MD;  Location: [facility];  Service: Orthopedics;  Laterality: Left;  . WRIST SURGERY Bilateral    plates and screws     Medication: Current Facility-Administered Medications  Medication Dose Route Frequency Provider Last Rate Last Dose  . acetaminophen (TYLENOL) tablet 650 mg  650 mg Oral Q6H PRN Elodia Florence., MD       Or  . acetaminophen (TYLENOL) suppository 650 mg  650 mg Rectal Q6H PRN Elodia Florence., MD      . allopurinol (ZYLOPRIM) tablet 300 mg  300 mg Oral Daily Elodia Florence., MD   300 mg at 10/27/17 0943  . atorvastatin (LIPITOR) tablet 80 mg  80 mg Oral  Rake., MD   80 mg at 10/27/17 1732  . carvedilol (COREG) tablet 6.25 mg  6.25 mg Oral BID WC Elodia Florence., MD   6.25 mg at 10/27/17 1732  . chlorhexidine (PERIDEX) 0.12 % solution 15 mL  15 mL Mouth Rinse BID Elodia Florence., MD   15 mL at 10/27/17 2101  . cholecalciferol (VITAMIN D) tablet 1,000 Units  1,000 Units Oral Daily Elodia Florence., MD    1,000 Units at 10/27/17 [phone]  . diclofenac sodium (VOLTAREN) 1 % transdermal gel 1 application  1 application Topical BID Elodia Florence., MD   1 application at 75/10/25 2103  . fentaNYL (SUBLIMAZE) injection 50 mcg  50 mcg Intravenous Once Elodia Florence., MD   Stopped at 10/26/17 [phone]  . fentaNYL (SUBLIMAZE) injection 50 mcg  50 mcg Intravenous Q2H PRN Elodia Florence., MD      . furosemide (LASIX) tablet 40 mg  40 mg Oral Daily Elodia Florence., MD   40 mg at 10/27/17 0943  . MEDLINE mouth rinse  15 mL Mouth Rinse q12n4p Elodia Florence., MD   15 mL at 10/27/17 1700  . multivitamin with minerals tablet 1 tablet  1 tablet Oral Daily Elodia Florence., MD   1 tablet at 10/27/17 [phone]  . nitroGLYCERIN (NITROSTAT) SL tablet 0.4 mg  0.4 mg Sublingual Q5 Min x 3 PRN Elodia Florence., MD      . ondansetron [facility]) tablet 4 mg  4 mg Oral Q6H PRN Elodia Florence., MD       Or  . ondansetron [facility]) injection 4 mg  4 mg Intravenous Q6H PRN Elodia Florence., MD      . oxyCODONE (Oxy IR/ROXICODONE) immediate release tablet 5 mg  5 mg Oral Q4H PRN Elodia Florence., MD   5 mg at 10/27/17 2356  . pantoprazole (PROTONIX) EC tablet 40 mg  40 mg Oral Daily Elodia Florence., MD   40 mg at 10/27/17 0943  . polyethylene glycol (MIRALAX / GLYCOLAX) packet 17 g  17 g Oral Daily Elodia Florence., MD   17 g at 10/27/17 [phone]  . sacubitril-valsartan (ENTRESTO) 24-26 mg per tablet  1 tablet Oral BID Elodia Florence., MD   1 tablet at 10/27/17 2101  . spironolactone (ALDACTONE) tablet 25 mg  25 mg Oral Daily Elodia Florence., MD   25 mg at 10/27/17 [phone]  . tamsulosin (FLOMAX) capsule 0.8 mg  0.8 mg Oral Daily Wood, Case M, MD   0.8 mg at 10/27/17 3154    Allergies: Allergies  Allergen Reactions  . No Known Allergies     Social History: Social History   Tobacco Use  . Smoking status: Never Smoker  . Smokeless tobacco: Never  Used  Substance Use Topics  . Alcohol use: No    Alcohol/week: 0.0 standard drinks  . Drug use: No    Family History Family History  Problem Relation Age of Onset  . Aneurysm Father     Review of Systems 10 systems were reviewed and are negative except as noted specifically in the HPI.  Objective   Vital signs in last 24 hours: BP 122/70   Pulse 77   Temp 98.1 F (36.7 C) (Oral)   Resp 14   Ht 6' (1.829 m)   Wt 113.4 kg   SpO2  92%   BMI 33.91 kg/m   Physical Exam General: NAD, A&O, resting, appropriate HEENT: [place]/AT, EOMI, MMM Pulmonary: Normal work of breathing Cardiovascular: HDS, adequate peripheral perfusion Abdomen: Soft, nondistended, tenderness to deep palpation in RLQ GU: No CVAT, Foley catheter draining clear yellow urine Extremities: warm and well perfused, some lower extremity edema Neuro: Appropriate, no focal neurological deficits  Most Recent Labs: Lab Results  Component Value Date   WBC 8.4 10/28/2017   HGB 11.4 (L) 10/28/2017   HCT 33.7 (L) 10/28/2017   PLT 200 10/28/2017    Lab Results  Component Value Date   NA 138 10/28/2017   K 4.1 10/28/2017   CL 103 10/28/2017   CO2 28 10/28/2017   BUN 22 10/28/2017   CREATININE 1.00 10/28/2017   CALCIUM 8.8 (L) 10/28/2017   MG 2.2 10/27/2017    Lab Results  Component Value Date   INR 1.34 10/27/2017     IMAGING: No results found.  ------  I have independently seen and examined the patient and reviewed the pertinent labs and notes.  I concur with the assessment and plan.

## 2017-10-28 NOTE — Progress Notes (Signed)
PROGRESS NOTE    Kevin Hart  OVZ:[id number] DOB: 1945-12-19 DOA: 10/25/2017 PCP: Guadlupe Spanish, MD   Brief Narrative:  HPI on 10/26/2017 by Dr. Fayrene Helper Kevin Hart is a 72 y.o. 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  He notes that his pain started yesterday sometime during the day.  You describes it as tremendous pain in his right lower abdomen.  He was appendicitis.  They were planning on seeing his doctor today, but around 10:30 at night he could not stand the pain anymore.  He describes the pain as grabbing and squeezing.  He describes it as constant.  Nothing made it worse, but pain meds made it better.  He does not note any fevers.  He did note some nausea and vomiting.  He denies any chills or chest pain.  He denies any dysuria.  He notes that his urine was brown in the emergency department, but did not note any bright red blood.   Interim history Admitted for abdominal pain and noted to have a hemorrhagic right renal cyst.  Urology consulted. Assessment & Plan   Abdominal pain secondary to right hemorrhagic renal cyst -CT abdomen pelvis showed large heterogeneous lesion arising at the interpolar region of the right kidney, 10.4 x 9.5 x 8.4 cm (same location of simple appearing cysts 16 years ago); associated blood tracts about the right kidney and inferiorly along the right pericolic gutter -Patient has been on Coumadin for recurrent VTE.  Patient did receive vitamin K. -Currently hemoglobin has remained stable- 11.4 -Urology consulted and appreciated, pending recommendations  Hematuria -Secondary to the above  Urinary retention with right-sided hydronephrosis/BPH -Currently has Foley catheter in place, started on  Flomax -Imaging noted enlarged prostate -Pending further recommendations from urology  History of DVT with positive lupus anticoagulant -Coumadin held due to the above -Patient was given vitamin K -Lower extremity Doppler negative for DVT  Coronary artery disease -Status post stent placement -Currently chest pain-free -Continue statin, Coreg  Chronic systolic heart failure -Status post ICD placement -appears to be euvolemic and compensated -Echocardiogram 08/18/2017 shows an EF of 35 to 41%, grade 1 diastolic dysfunction.  Anterior, anterior septal, apical, inferior apical hypokinesis -Continue Lasix, Entresto, Coreg, eplerenone -Monitor intake and output, daily weights  Gout -Currently appears to be stable, continue allopurinol  GERD -Continue PPI  Abdominal aortic aneurysm -Noted on CT abdomen pelvis, 4 cm in AP diameter, 4.1 cm in transverse dimension -will need follow-up with ultrasound in 1 year  DVT Prophylaxis  SCDs  Code Status: Full  Family Communication: None at bedside  Disposition Plan: Admitted, pending further recommendations from urology. Suspect home when medically stable. Will transfer to medical floor   Consultants Urology  Procedures  Lower extremity doppler  Antibiotics   Anti-infectives (From admission, onward)   None      Subjective:   Kevin Hart seen and examined today.  Feels abdominal pain has improved.  Denies current nausea or vomiting, diarrhea or constipation.  Denies current chest pain, shortness of breath, dizziness or headache.  Objective:   Vitals:   10/28/17 0400 10/28/17 0500 10/28/17 0600 10/28/17 0800  BP: (!) 103/49 (!) 121/56 122/70   Pulse: 72 76 77   Resp: 15 17 14    Temp: 98.1 F (36.7 C)  98.2 F (36.8 C)  TempSrc: Oral   Oral  SpO2: 95% 94% 92%   Weight:  113.4 kg    Height:        Intake/Output Summary (Last 24 hours) at 10/28/2017 0951 Last data filed at 10/28/2017 0600 Gross per 24 hour  Intake 220  ml  Output 1650 ml  Net -1430 ml   Filed Weights   10/26/17 1322 10/27/17 0428 10/28/17 0500  Weight: 113.4 kg 114.3 kg 113.4 kg    Exam  General: Well developed, well nourished, NAD, appears stated age  HEENT: NCAT, PERRLA, EOMI, Anicteic Sclera, mucous membranes moist.   Neck: Supple, no JVD, no masses  Cardiovascular: S1 S2 auscultated, RRR, no murmur  Respiratory: Clear to auscultation bilaterally with equal chest rise  Abdomen: Soft, nontender, nondistended, + bowel sounds  Extremities: warm dry without cyanosis clubbing. Mild  LE edema  Neuro: AAOx3, nonfocal  Psych: Normal affect and demeanor, pleasant   Data Reviewed: I have personally reviewed following labs and imaging studies  CBC: Recent Labs  Lab 10/25/17 2349 10/26/17 0415  10/26/17 1705 10/26/17 2249 10/27/17 0549 10/27/17 1132 10/28/17 0320  WBC 12.2* 10.5  --   --   --  9.4  --  8.4  HGB 14.7 13.1   < > 12.7* 12.2* 11.8* 12.0* 11.4*  HCT 42.0 38.6*   < > 37.5* 35.8* 35.3* 36.5* 33.7*  MCV 89.6 91.0  --   --   --  92.4  --  90.8  PLT 201 190  --   --   --  194  --  200   < > = values in this interval not displayed.   Basic Metabolic Panel: Recent Labs  Lab 10/25/17 2349 10/26/17 0415 10/27/17 0549 10/28/17 0320  NA 136 139 140 138  K 3.8 4.0 4.3 4.1  CL 104 108 104 103  CO2 24 22 30 28   GLUCOSE 181* 162* 122* 133*  BUN 29* 26* 27* 22  CREATININE 1.49* 1.16 1.21 1.00  CALCIUM 9.3 8.7* 9.2 8.8*  MG  --   --  2.2  --    GFR: Estimated Creatinine Clearance: 85.5 mL/min (by C-G formula based on SCr of 1 mg/dL). Liver Function Tests: Recent Labs  Lab 10/25/17 2349 10/27/17 0549  AST 24 19  ALT 21 18  ALKPHOS 46 32*  BILITOT 1.4* 1.8*  PROT 7.3 6.4*  ALBUMIN 3.9 3.2*   Recent Labs  Lab 10/25/17 2349  LIPASE 40   No results for input(s): AMMONIA in the last 168 hours. Coagulation Profile: Recent Labs  Lab 10/25/17 2349 10/27/17 0549  INR 2.78 1.34   Cardiac  Enzymes: Recent Labs  Lab 10/25/17 2349  TROPONINI <0.03   BNP (last 3 results) No results for input(s): PROBNP in the last 8760 hours. HbA1C: No results for input(s): HGBA1C in the last 72 hours. CBG: No results for input(s): GLUCAP in the last 168 hours. Lipid Profile: No results for input(s): CHOL, HDL, LDLCALC, TRIG, CHOLHDL, LDLDIRECT in the last 72 hours. Thyroid Function Tests: No results for input(s): TSH, T4TOTAL, FREET4, T3FREE, THYROIDAB in the last 72 hours. Anemia Panel: No results for input(s): VITAMINB12, FOLATE, FERRITIN, TIBC, IRON, RETICCTPCT in the last 72 hours. Urine analysis:    Component Value Date/Time   COLORURINE YELLOW 10/26/2017 0357   APPEARANCEUR CLOUDY (A) 10/26/2017 0357   LABSPEC >1.046 (H) 10/26/2017 0357   PHURINE 5.0 10/26/2017 0357   GLUCOSEU 50 (A) 10/26/2017 9163  HGBUR LARGE (A) 10/26/2017 0357   BILIRUBINUR NEGATIVE 10/26/2017 0357   KETONESUR 5 (A) 10/26/2017 0357   PROTEINUR 100 (A) 10/26/2017 0357   NITRITE NEGATIVE 10/26/2017 0357   LEUKOCYTESUR NEGATIVE 10/26/2017 0357   Sepsis Labs: @LABRCNTIP (procalcitonin:4,lacticidven:4)  ) Recent Results (from the past 240 hour(s))  MRSA PCR Screening     Status: None   Collection Time: 10/26/17  1:24 PM  Result Value Ref Range Status   MRSA by PCR NEGATIVE NEGATIVE Final    Comment:        The GeneXpert MRSA Assay (FDA approved for NASAL specimens only), is one component of a comprehensive MRSA colonization surveillance program. It is not intended to diagnose MRSA infection nor to guide or monitor treatment for MRSA infections. Performed at [facility], [place] [address] [place], [place] [address]       Radiology Studies: No results found.   Scheduled Meds: . allopurinol  300 mg Oral Daily  . atorvastatin  80 mg Oral q1800  . carvedilol  6.25 mg Oral BID WC  . chlorhexidine  15 mL Mouth Rinse BID  . cholecalciferol  1,000 Units Oral Daily  .  diclofenac sodium  1 application Topical BID  . fentaNYL (SUBLIMAZE) injection  50 mcg Intravenous Once  . furosemide  40 mg Oral Daily  . mouth rinse  15 mL Mouth Rinse q12n4p  . multivitamin with minerals  1 tablet Oral Daily  . pantoprazole  40 mg Oral Daily  . polyethylene glycol  17 g Oral Daily  . sacubitril-valsartan  1 tablet Oral BID  . spironolactone  25 mg Oral Daily  . tamsulosin  0.8 mg Oral Daily   Continuous Infusions:   LOS: 2 days   Time Spent in minutes   30 minutes  Kevin Hart D.O. on 10/28/2017 at 9:51 AM  Between 7am to 7pm - Pager - [phone]  After 7pm go to [email or web address] - password [email or web address]  And look for the night coverage person covering for me after hours  [facility] Office  [phone]

## 2017-10-28 NOTE — Care Management Note (Signed)
Case Management Note  Patient Details  Name: Kevin Hart MRN: [id number] Date of Birth: 1945/04/29  Subjective/Objective:     Transferred from 1239 to 1515.               Action/Plan:no needs at tthis time of review.   Expected Discharge Date:  (unknown)               Expected Discharge Plan:  Home/Self Care  In-House Referral:     Discharge planning Services  CM Consult  Post Acute Care Choice:    Choice offered to:     DME Arranged:    DME Agency:     HH Arranged:    [place] Agency:     Status of Service:  Completed, signed off  If discussed at [employer] of Stay Meetings, dates discussed:    Additional Comments:  Leeroy Cha, RN 10/28/2017, 11:47 AM

## 2017-10-29 DIAGNOSIS — K219 Gastro-esophageal reflux disease without esophagitis: Secondary | ICD-10-CM

## 2017-10-29 DIAGNOSIS — N390 Urinary tract infection, site not specified: Secondary | ICD-10-CM

## 2017-10-29 DIAGNOSIS — R109 Unspecified abdominal pain: Secondary | ICD-10-CM

## 2017-10-29 DIAGNOSIS — R339 Retention of urine, unspecified: Secondary | ICD-10-CM

## 2017-10-29 DIAGNOSIS — R319 Hematuria, unspecified: Secondary | ICD-10-CM

## 2017-10-29 DIAGNOSIS — M109 Gout, unspecified: Secondary | ICD-10-CM

## 2017-10-29 LAB — CBC
HCT: 36.8 % — ABNORMAL LOW (ref 39.0–52.0)
HEMOGLOBIN: 12.4 g/dL — AB (ref 13.0–17.0)
MCH: 30.6 pg (ref 26.0–34.0)
MCHC: 33.7 g/dL (ref 30.0–36.0)
MCV: 90.9 fL (ref 78.0–100.0)
PLATELETS: 235 10*3/uL (ref 150–400)
RBC: 4.05 MIL/uL — ABNORMAL LOW (ref 4.22–5.81)
RDW: 14.4 % (ref 11.5–15.5)
WBC: 8.3 10*3/uL (ref 4.0–10.5)

## 2017-10-29 LAB — BASIC METABOLIC PANEL
Anion gap: 9 (ref 5–15)
BUN: 20 mg/dL (ref 8–23)
CALCIUM: 9.4 mg/dL (ref 8.9–10.3)
CHLORIDE: 102 mmol/L (ref 98–111)
CO2: 28 mmol/L (ref 22–32)
CREATININE: 1.12 mg/dL (ref 0.61–1.24)
Glucose, Bld: 121 mg/dL — ABNORMAL HIGH (ref 70–99)
Potassium: 4.6 mmol/L (ref 3.5–5.1)
Sodium: 139 mmol/L (ref 135–145)

## 2017-10-29 MED ORDER — TAMSULOSIN HCL 0.4 MG PO CAPS: 0.8000 mg | ORAL_CAPSULE | Freq: Every day | ORAL | 0 refills | Status: DC

## 2017-10-29 MED ORDER — WARFARIN SODIUM 5 MG PO TABS: 2.5000 mg | ORAL_TABLET | ORAL | Status: DC

## 2017-10-29 NOTE — Discharge Summary (Signed)
Physician Discharge Summary  Kevin Hart IOE:[id number] DOB: 1945/11/19 DOA: 10/25/2017  PCP: Guadlupe Spanish, MD  Admit date: 10/25/2017 Discharge date: 10/29/2017  Time spent: 45 minutes  Recommendations for Outpatient Follow-up:  Patient will be discharged to home.  Patient will need to follow up with primary care provider within one week of discharge. Restart your coumadin 11/02/2017. Follow up urology in 4 weeks.  Patient should continue medications as prescribed.  Patient should follow a heart healthy diet.   Discharge Diagnoses:  Abdominal pain secondary to right hemorrhagic renal cyst Hematuria Urinary retention with right-sided hydronephrosis/BPH History of DVT with positive lupus anticoagulant Coronary artery disease Chronic systolic heart failure Gout GERD Abdominal aortic aneurysm  Discharge Condition: Stable  Diet recommendation: Heart healthy  Filed Weights   10/26/17 1322 10/27/17 0428 10/28/17 0500  Weight: 113.4 kg 114.3 kg 113.4 kg    History of present illness:  on 10/26/2017 by Dr. 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  He notes that his pain started yesterday sometime during the day. You describes it as tremendous pain in his right lower abdomen. He was appendicitis. They were planning on seeing his doctor today, but around 10:30 at night he could not stand the pain anymore. He describes the pain as grabbing and squeezing. He describes it as constant. Nothing made it worse, but pain meds made it better. He does not note any fevers. He did note some nausea and vomiting. He denies any chills or chest pain. He denies any dysuria. He notes that his urine was  brown in the emergency department, but did not note any bright red blood.   Hospital Course:  Abdominal pain secondary to right hemorrhagic renal cyst -CT abdomen pelvis showed large heterogeneous lesion arising at the interpolar region of the right kidney, 10.4 x 9.5 x 8.4 cm (same location of simple appearing cysts 16 years ago); associated blood tracts about the right kidney and inferiorly along the right pericolic gutter -Patient has been on Coumadin for recurrent VTE.  Patient did receive vitamin K. -Currently hemoglobin has remained stable- 12.4 -Abdominal pain has resolved -Urology consulted and appreciated, recommending restarting coumadin next week and following up with PCP. Discharge patient with flomax 0.8mg , urology follow up in 4 weeks  Hematuria -Secondary to the above  Urinary retention with right-sided hydronephrosis/BPH -Currently has Foley catheter in place, started on Flomax -Imaging noted enlarged prostate -Pending further recommendations from urology  History of DVT with positive lupus anticoagulant -Coumadin held due to the above -Patient was given vitamin K -Lower extremity Doppler negative for DVT -will discharge patient with SCDs  Coronary artery disease -Status post stent placement -Currently chest pain-free -Continue statin, Coreg  Chronic systolic heart failure -Status post ICD placement -appears to be euvolemic and compensated -Echocardiogram 08/18/2017 shows an EF of 35 to 18%, grade 1 diastolic dysfunction.  Anterior, anterior septal, apical, inferior apical hypokinesis -Continue Lasix, Entresto, Coreg, eplerenone  Gout -Currently appears to be stable, continue allopurinol  GERD -Continue PPI  Abdominal aortic aneurysm -Noted on CT abdomen pelvis, 4 cm in AP diameter, 4.1 cm in transverse dimension -will need follow-up with ultrasound in 1 year  Consultants Urology  Procedures  Lower extremity doppler  Discharge Exam: Vitals:     10/28/17 2116 10/29/17 0542  BP: 118/65  112/72  Pulse: 83 82  Resp: 18 20  Temp: 98.6 F (37 C) 98.1 F (36.7 C)  SpO2: 96% 98%   Patient feeling better and has little to no pain. Denies current chest pain, shortness of breath, abdominal pain, nausea vomiting, diarrhea constipation.  Looking forward to going home today.   General: Well developed, well nourished, NAD, appears stated age  HEENT: NCAT, mucous membranes moist.  Neck: Supple,  Cardiovascular: S1 S2 auscultated, no rubs, murmurs or gallops. Regular rate and rhythm.  Respiratory: Clear to auscultation bilaterally with equal chest rise  Abdomen: Soft, nontender, nondistended, + bowel sounds  Extremities: warm dry without cyanosis clubbing  Neuro: AAOx3, nonfocal  Psych: Normal affect and demeanor with intact judgement and insight, pleasant  Discharge Instructions Discharge Instructions    Discharge instructions   Complete by:  As directed    Patient will be discharged to home.  Patient will need to follow up with primary care provider within one week of discharge. Restart your coumadin 11/02/2017. Follow up urology in 4 weeks.  Patient should continue medications as prescribed.  Patient should follow a heart healthy diet.     Allergies as of 10/29/2017      Reactions   No Known Allergies       Medication List    TAKE these medications   allopurinol 300 MG tablet Commonly known as:  ZYLOPRIM Take 1 tablet by mouth daily.   atorvastatin 80 MG tablet Commonly known as:  LIPITOR Take 1 tablet (80 mg total) by mouth daily at 6 PM.   carvedilol 6.25 MG tablet Commonly known as:  COREG Take 6.25 mg by mouth 2 (two) times daily with a meal.   cholecalciferol 1000 units tablet Commonly known as:  VITAMIN D Take 1,000 Units by mouth daily.   diclofenac sodium 1 % Gel Commonly known as:  VOLTAREN Apply 1 application topically 2 (two) times daily.   ENTRESTO 24-26 MG Generic drug:   sacubitril-valsartan TAKE ONE TABLET BY MOUTH TWICE DAILY -TO REPLACE LISINOPRIL (START 36HOURS AFTER LAST DOSE)   eplerenone 25 MG tablet Commonly known as:  INSPRA Take 25 mg by mouth daily.   furosemide 40 MG tablet Commonly known as:  LASIX Take 40 mg by mouth daily.   MULTI-VITAMINS Tabs Take 1 tablet by mouth daily.   nitroGLYCERIN 0.4 MG SL tablet Commonly known as:  NITROSTAT Place 1 tablet (0.4 mg total) under the tongue every 5 (five) minutes x 3 doses as needed for chest pain.   pantoprazole 40 MG tablet Commonly known as:  PROTONIX Take 1 tablet (40 mg total) by mouth daily.   tamsulosin 0.4 MG Caps capsule Commonly known as:  FLOMAX Take 2 capsules (0.8 mg total) by mouth daily. Start taking on:  10/30/2017 What changed:  how much to take   warfarin 5 MG tablet Commonly known as:  COUMADIN Take 0.5-1 tablets (2.5-5 mg total) by mouth See admin instructions. Take 5 mg by mouth on Tuesday, Wednesday, Thursday, Saturday and Sunday then take 2.5 mg by mouth on Monday and Friday Start taking on:  11/01/2017 What changed:    how much to take  when to take this  additional instructions  These instructions start on 11/01/2017. If you are unsure what to do until then, ask your doctor or other care provider.            Durable Medical Equipment  (From admission, onward)         Start  Ordered   10/29/17 0938  For home use only DME Other see comment  Once    Comments:  SCDs   10/29/17 0937         Allergies  Allergen Reactions  . No Known Allergies    Follow-up Information    Irine Seal, MD Follow up.   Specialty:  Urology Why:  My office will call to arrange a follow up appointment for the next 3-4 weeks.  I will schedule a CT scan in our office for that visit.   Contact information: [place] [address] [phone]        Guadlupe Spanish, MD. Go on 11/02/2017.   Specialty:  Internal Medicine Contact information: [place] [address] [phone]            The results of significant diagnostics from this hospitalization (including imaging, microbiology, ancillary and laboratory) are listed below for reference.    Significant Diagnostic Studies: Dg Chest 2 View  Result Date: 10/26/2017 CLINICAL DATA:  Acute onset of nausea, vomiting and fever. EXAM: CHEST - 2 VIEW COMPARISON:  Chest radiograph performed 10/19/2015 FINDINGS: The lungs are well-aerated. Mild left basilar airspace opacity likely reflects atelectasis. There is no evidence of pleural effusion or pneumothorax. The heart is normal in size; an AICD is noted at the left chest wall, with a single lead ending at the right ventricle. No acute osseous abnormalities are seen. IMPRESSION: Mild left basilar airspace opacity likely reflects atelectasis; lungs otherwise clear. Electronically Signed   By: Garald Balding M.D.   On: 10/26/2017 01:41   Ct Abdomen Pelvis W Contrast  Result Date: 10/26/2017 CLINICAL DATA:  Acute onset of right lower quadrant abdominal pain, nausea and vomiting. EXAM: CT ABDOMEN AND PELVIS WITH CONTRAST TECHNIQUE: Multidetector CT imaging of the abdomen and pelvis was performed using the standard protocol following bolus administration of intravenous contrast. CONTRAST:  21mL ISOVUE-300 IOPAMIDOL (ISOVUE-300) INJECTION 61% COMPARISON:  CT of the abdomen and pelvis performed 04/29/2001, and MRI of the lumbar spine performed 03/13/2002 FINDINGS: Lower chest: Minimal bibasilar atelectasis or scarring is noted. A pacemaker lead is partially imaged. Mild wall thickening at the distal esophagus may reflect chronic inflammation. Hepatobiliary: Scattered tiny hypodensities within the liver are nonspecific but may reflect small cysts. The liver is otherwise unremarkable. The gallbladder is unremarkable. The common bile duct is normal in caliber. Pancreas: The pancreas is within normal limits. Spleen: The spleen is unremarkable in  appearance. Adrenals/Urinary Tract: The adrenal glands are unremarkable in appearance. There is a large heterogeneous lesion arising at the interpole region of the right kidney, measuring approximately 10.5 x 9.5 x 8.4 cm, with foci of contrast blush at the superior aspect of the lesion. Given that this is in the same location as a previously noted large simple-appearing cyst 16 years ago, this appears to reflect active hemorrhage into a large renal cyst. Associated blood tracks about the right kidney and inferiorly along the right paracolic gutter. 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 Underlying mild right-sided hydronephrosis is noted, with apparent obstruction of the right renal pelvis. Blood tracks about the course of the right ureter. A left renal cyst is noted. The left kidney is otherwise unremarkable. There is no evidence of hydronephrosis on the left. No renal or ureteral stones are identified. Stomach/Bowel: The stomach is unremarkable in appearance. The small bowel is within normal limits. The  appendix is normal in caliber, without evidence of appendicitis, seen adjacent to the inferior tip of the liver. Minimal diverticulosis is noted at the proximal sigmoid colon, without evidence of diverticulitis. 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 Scattered calcification is seen along the abdominal aorta and its branches. Blood tracks about the inferior vena cava, but the IVC is otherwise unremarkable in appearance. No retroperitoneal or pelvic sidewall lymphadenopathy is seen. Reproductive: An apparent stone is seen dependently within the bladder. The bladder is somewhat irregular in appearance, possibly reflecting chronic inflammation. The prostate is enlarged, measuring 6.2 cm in transverse  dimension, with scattered calcification. Other: No additional soft tissue abnormalities are seen. Musculoskeletal: No acute osseous abnormalities are identified. Multilevel vacuum phenomenon is noted along the lumbar spine. The visualized musculature is unremarkable in appearance. IMPRESSION: 1. Large heterogeneous lesion arising at the interpole region of the right kidney, measuring 10.4 x 9.5 x 8.4 cm, with foci of contrast blush at the superior aspect of the lesion. Given that this is in the same location as a previously noted large simple-appearing cyst 16 years ago, this appears to reflect active hemorrhage into a large renal cyst. 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 Associated blood tracks about the right kidney and inferiorly along the right paracolic gutter. 2. Mild right-sided hydronephrosis, with apparent obstruction of the right renal pelvis. Blood tracks about the course of the right ureter. 3. 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 Scattered calcification along the abdominal aorta and its branches. Recommend followup by ultrasound in 1 year. This recommendation follows ACR consensus guidelines: White Paper of the ACR Incidental Findings Committee II on Vascular Findings. J Am Coll Radiol 2013; 10:789-794. 4. Scattered tiny hypodensities within the liver are nonspecific but may reflect small cysts. 5. Minimal diverticulosis at the proximal sigmoid colon, without evidence of diverticulitis. 6. Mildly irregular appearance to the bladder, possibly reflecting chronic inflammation. Apparent bladder stone noted. 7. Significantly enlarged prostate.  Would correlate with PSA. 8. Mild wall thickening at the distal esophagus may reflect chronic inflammation. 9. Small left renal cyst noted. These results were called by telephone at the time of  interpretation on 10/26/2017 at 1:40 am to Dr. Ezequiel Essex, who verbally acknowledged these results. Electronically Signed   By: Garald Balding M.D.   On: 10/26/2017 01:41    Microbiology: Recent Results (from the past 240 hour(s))  MRSA PCR Screening     Status: None   Collection Time: 10/26/17  1:24 PM  Result Value Ref Range Status   MRSA by PCR NEGATIVE NEGATIVE Final    Comment:        The GeneXpert MRSA Assay (FDA approved for NASAL specimens only), is one component of a comprehensive MRSA colonization surveillance program. It is not intended to diagnose MRSA infection nor to guide or monitor treatment for MRSA infections. Performed at [facility], [place] [address], [place], [place] [address]      Labs: Basic Metabolic Panel: Recent Labs  Lab 10/25/17 2349 10/26/17 0415 10/27/17 0549 10/28/17 0320 10/29/17 0549  NA 136 139 140 138 139  K 3.8 4.0 4.3 4.1 4.6  CL 104 108 104 103 102  CO2 24  22 30 28 28   GLUCOSE 181* 162* 122* 133* 121*  BUN 29* 26* 27* 22 20  CREATININE 1.49* 1.16 1.21 1.00 1.12  CALCIUM 9.3 8.7* 9.2 8.8* 9.4  MG  --   --  2.2  --   --    Liver Function Tests: Recent Labs  Lab 10/25/17 2349 10/27/17 0549  AST 24 19  ALT 21 18  ALKPHOS 46 32*  BILITOT 1.4* 1.8*  PROT 7.3 6.4*  ALBUMIN 3.9 3.2*   Recent Labs  Lab 10/25/17 2349  LIPASE 40   No results for input(s): AMMONIA in the last 168 hours. CBC: Recent Labs  Lab 10/25/17 2349 10/26/17 0415  10/26/17 2249 10/27/17 0549 10/27/17 1132 10/28/17 0320 10/29/17 0549  WBC 12.2* 10.5  --   --  9.4  --  8.4 8.3  HGB 14.7 13.1   < > 12.2* 11.8* 12.0* 11.4* 12.4*  HCT 42.0 38.6*   < > 35.8* 35.3* 36.5* 33.7* 36.8*  MCV 89.6 91.0  --   --  92.4  --  90.8 90.9  PLT 201 190  --   --  194  --  200 235   < > = values in this interval not displayed.   Cardiac Enzymes: Recent Labs  Lab 10/25/17 2349  TROPONINI <0.03   BNP: BNP (last 3 results) No results for  input(s): BNP in the last 8760 hours.  ProBNP (last 3 results) No results for input(s): PROBNP in the last 8760 hours.  CBG: No results for input(s): GLUCAP in the last 168 hours.     Signed:  Cristal Ford  [facility] 10/29/2017, 9:38 AM

## 2017-10-29 NOTE — Discharge Instructions (Signed)

## 2017-10-29 NOTE — Care Management Important Message (Signed)
Important Message  Patient Details  Name: Kevin Hart MRN: [id number] Date of Birth: 03-13-1946   Medicare Important Message Given:  Yes    Kerin Salen 10/29/2017, 10:41 AMImportant Message  Patient Details  Name: Kevin Hart MRN: [id number] Date of Birth: [birth year]   Medicare Important Message Given:  Yes    Kerin Salen 10/29/2017, 10:40 AM

## 2017-10-29 NOTE — Progress Notes (Signed)
Kevin Hart to be D/C'd Home per MD order.  Discussed prescriptions and follow up appointments with the patient. Prescriptions given to patient, medication list explained in detail. Pt verbalized understanding.  Allergies as of 10/29/2017      Reactions   No Known Allergies       Medication List    TAKE these medications   allopurinol 300 MG tablet Commonly known as:  ZYLOPRIM Take 1 tablet by mouth daily.   atorvastatin 80 MG tablet Commonly known as:  LIPITOR Take 1 tablet (80 mg total) by mouth daily at 6 PM.   carvedilol 6.25 MG tablet Commonly known as:  COREG Take 6.25 mg by mouth 2 (two) times daily with a meal.   cholecalciferol 1000 units tablet Commonly known as:  VITAMIN D Take 1,000 Units by mouth daily.   diclofenac sodium 1 % Gel Commonly known as:  VOLTAREN Apply 1 application topically 2 (two) times daily.   ENTRESTO 24-26 MG Generic drug:  sacubitril-valsartan TAKE ONE TABLET BY MOUTH TWICE DAILY -TO REPLACE LISINOPRIL (START 36HOURS AFTER LAST DOSE)   eplerenone 25 MG tablet Commonly known as:  INSPRA Take 25 mg by mouth daily.   furosemide 40 MG tablet Commonly known as:  LASIX Take 40 mg by mouth daily.   MULTI-VITAMINS Tabs Take 1 tablet by mouth daily.   nitroGLYCERIN 0.4 MG SL tablet Commonly known as:  NITROSTAT Place 1 tablet (0.4 mg total) under the tongue every 5 (five) minutes x 3 doses as needed for chest pain.   pantoprazole 40 MG tablet Commonly known as:  PROTONIX Take 1 tablet (40 mg total) by mouth daily.   tamsulosin 0.4 MG Caps capsule Commonly known as:  FLOMAX Take 2 capsules (0.8 mg total) by mouth daily. Start taking on:  10/30/2017 What changed:  how much to take   warfarin 5 MG tablet Commonly known as:  COUMADIN Take 0.5-1 tablets (2.5-5 mg total) by mouth See admin instructions. Take 5 mg by mouth on Tuesday, Wednesday, Thursday, Saturday and Sunday then take 2.5 mg by mouth on Monday and Friday Start taking  on:  11/01/2017 What changed:    how much to take  when to take this  additional instructions  These instructions start on 11/01/2017. If you are unsure what to do until then, ask your doctor or other care provider.            Durable Medical Equipment  (From admission, onward)         Start     Ordered   10/29/17 0938  For home use only DME Other see comment  Once    Comments:  SCDs   10/29/17 0937          Vitals:   10/28/17 2116 10/29/17 0542  BP: 118/65 112/72  Pulse: 83 82  Resp: 18 20  Temp: 98.6 F (37 C) 98.1 F (36.7 C)  SpO2: 96% 98%    Skin clean, dry and intact without evidence of skin break down, no evidence of skin tears noted. IV catheter discontinued intact. Site without signs and symptoms of complications. Dressing and pressure applied. Pt denies pain at this time. No complaints noted.  An After Visit Summary was printed and given to the patient. Patient escorted via [place], and D/C home via private auto.  Lolita Rieger 10/29/2017 12:49 PM

## 2017-10-29 NOTE — Care Management Note (Signed)
Case Management Note  Patient Details  Name: JEFFRY VOGELSANG MRN: [id number] Date of Birth: [birth year]  Subjective/Objective:     Discharge-dme scd'd                Action/Plan:  Advanced dme repres.notified/ patient to return home with self care.   Expected Discharge Date:  10/29/17               Expected Discharge Plan:  Home/Self Care  In-House Referral:     Discharge planning Services  CM Consult  Post Acute Care Choice:    Choice offered to:     DME Arranged:  Other see comment DME Agency:  [place]:    [place] Agency:     Status of Service:  Completed, signed off  If discussed at [place] of Stay Meetings, dates discussed:    Additional Comments:  Leeroy Cha, RN 10/29/2017, 11:32 AM

## 2017-10-29 NOTE — Progress Notes (Signed)
Pt has voided 2 times this evening and has been putting out more than 152ml both times. Does not complain of any discomfort. Will bladder scan in the AM and continue to monitor.

## 2017-10-29 NOTE — Progress Notes (Signed)
Urology Progress Note  Assessment:  72 y.o. male with right sided abdominal pain in setting of large right renal mass, most likely representative of a hemorrhagic cyst. Patient has remained stable.   Recommendations: - OK to discharge from urologic perspective - Encourage ambulation at home, no straining or heavy lifting. - Continue tamsulosin at 0.8mg . - Would wait until first of next week to resume warfarin, patient scheduled to see PCP 8/13 - Consider sending patient home with SCDs until he is again on therapeutic anticoagulation. - Urology will plan for outpatient follow up with reimaging in 4 weeks.  Thank you for this consult. Please contact the urology consult pager with any further questions/concerns.  Subjective/Interval Events: NAEON AFVSS Voiding volitionally with adequate UOP, subjectively emptying well, PVR 356 mL which is likely his baseline Hgb 12.4 this am  Past Medical History: Past Medical History:  Diagnosis Date  . Arthritis   . Bladder stone 10/26/2017  . BPH (benign prostatic hyperplasia)    takes Flomax daily  . CAD (coronary artery disease)    a. prior LAD stenting;  b. 06/2014 NSTEMI/PCI: LM nl, LAD 30p, 71m ISR (3.0x20 Promus DES), LCX nl, RCA nondom, nl, EF 25-30% ant, apical, dist inf AK.  . Cataracts, bilateral    immature  . Chronic systolic CHF (congestive heart failure) ([place]) 12/31/2014  . DVT (deep venous thrombosis) (HCC)    RECURRENT left leg  . Dyslipidemia   . GERD (gastroesophageal reflux disease)   . Gout   . History of kidney stones   . Hypertension   . Ischemic cardiomyopathy 07/06/2014  . Lupus anticoagulant positive   . S/P drug eluting coronary stent placement, 07/06/14, Promus 07/07/2014  . Sleep apnea   . Subarachnoid bleed ([place]) 2015   after falling and hitting his head    Past Surgical History:  Past Surgical History:  Procedure Laterality Date  . COLONOSCOPY    . EP IMPLANTABLE DEVICE [name] 11/27/2014   St. Jude Medical  Fortify Assura VR  implanted by Dr Rayann Heman for primary prevention  . LEFT HEART CATHETERIZATION WITH CORONARY ANGIOGRAM [name] 07/06/2014   Procedure: LEFT HEART CATHETERIZATION WITH CORONARY ANGIOGRAM;  Surgeon: Peter M Martinique, MD;  [facility] CATH LAB;  Service: Cardiovascular;  Laterality: [name];  . PERCUTANEOUS CORONARY STENT INTERVENTION (PCI-S) Right 07/06/2014   Procedure: PERCUTANEOUS CORONARY STENT INTERVENTION (PCI-S);  Surgeon: Peter M Martinique, MD;  Location: [facility] CATH LAB;  Service: Cardiovascular;  Laterality: Right;  . SHOULDER ARTHROSCOPY WITH SUBACROMIAL DECOMPRESSION Left 03/05/2016   Procedure: SHOULDER ARTHROSCOPY ROTATOR CUFF REPAIR WITH SUBACROMIAL DECOMPRESSION;  Surgeon: Tania Ade, MD;  Location: [place];  Service: Orthopedics;  Laterality: Left;  SHOULDER ARTHROSCOPY ROTATOR CUFF REPAIR WITH SUBACROMIAL DECOMPRESSION  . SHOULDER CLOSED REDUCTION Left 11/29/2015   Procedure: CLOSED REDUCTION SHOULDER;  Surgeon: Tania Ade, MD;  Location: [place];  Service: Orthopedics;  Laterality: Left;  . WRIST SURGERY Bilateral    plates and screws     Medication: Current Facility-Administered Medications  Medication Dose Route Frequency Provider Last Rate Last Dose  . acetaminophen (TYLENOL) tablet 650 mg  650 mg Oral Q6H PRN Cristal Ford, DO       Or  . acetaminophen (TYLENOL) suppository 650 mg  650 mg Rectal Q6H PRN Cristal Ford, DO      . allopurinol (ZYLOPRIM) tablet 300 mg  300 mg Oral Daily Cristal Ford, DO   300 mg at 10/28/17 1008  . atorvastatin (LIPITOR) tablet 80 mg  80 mg Oral q1800  Cristal Ford, DO   80 mg at 10/28/17 1759  . carvedilol (COREG) tablet 6.25 mg  6.25 mg Oral BID WC Mikhail, Kiester, DO   6.25 mg at 10/28/17 1759  . chlorhexidine (PERIDEX) 0.12 % solution 15 mL  15 mL Mouth Rinse BID Cristal Ford, DO   15 mL at 10/28/17 2111  . cholecalciferol (VITAMIN D) tablet 1,000 Units  1,000 Units Oral Daily Cristal Ford, DO   1,000 Units at  10/28/17 1009  . diclofenac sodium (VOLTAREN) 1 % transdermal gel 1 application  1 application Topical BID Cristal Ford, DO   1 application at 82/95/62 2112  . fentaNYL (SUBLIMAZE) injection 50 mcg  50 mcg Intravenous Once Cristal Ford, DO   Stopped at 10/26/17 0242  . fentaNYL (SUBLIMAZE) injection 50 mcg  50 mcg Intravenous Q2H PRN Cristal Ford, DO      . furosemide (LASIX) tablet 40 mg  40 mg Oral Daily Cristal Ford, DO   40 mg at 10/28/17 1009  . multivitamin with minerals tablet 1 tablet  1 tablet Oral Daily Cristal Ford, DO   1 tablet at 10/28/17 1009  . nitroGLYCERIN (NITROSTAT) SL tablet 0.4 mg  0.4 mg Sublingual Q5 Min x 3 PRN Cristal Ford, DO      . ondansetron [facility]) tablet 4 mg  4 mg Oral Q6H PRN Cristal Ford, DO       Or  . ondansetron [facility]) injection 4 mg  4 mg Intravenous Q6H PRN Cristal Ford, DO      . oxyCODONE (Oxy IR/ROXICODONE) immediate release tablet 5 mg  5 mg Oral Q4H PRN Cristal Ford, DO   5 mg at 10/27/17 2356  . pantoprazole (PROTONIX) EC tablet 40 mg  40 mg Oral Daily Cristal Ford, DO   40 mg at 10/28/17 1009  . polyethylene glycol (MIRALAX / GLYCOLAX) packet 17 g  17 g Oral Daily Cristal Ford, DO   17 g at 10/28/17 1008  . sacubitril-valsartan (ENTRESTO) 24-26 mg per tablet  1 tablet Oral BID Cristal Ford, DO   1 tablet at 10/28/17 2111  . spironolactone (ALDACTONE) tablet 25 mg  25 mg Oral Daily Cristal Ford, DO   25 mg at 10/28/17 1009  . tamsulosin (FLOMAX) capsule 0.8 mg  0.8 mg Oral Daily Cristal Ford, DO   0.8 mg at 10/28/17 1010    Allergies: Allergies  Allergen Reactions  . No Known Allergies     Social History: Social History   Tobacco Use  . Smoking status: Never Smoker  . Smokeless tobacco: Never Used  Substance Use Topics  . Alcohol use: No    Alcohol/week: 0.0 standard drinks  . Drug use: No    Family History Family History  Problem Relation Age of Onset  . Aneurysm Father      Review of Systems 10 systems were reviewed and are negative except as noted specifically in the HPI.  Objective   Vital signs in last 24 hours: BP 112/72 (BP Location: Left Arm)   Pulse 82   Temp 98.1 F (36.7 C)   Resp 20   Ht 6' (1.829 m)   Wt 113.4 kg   SpO2 98%   BMI 33.91 kg/m   Physical Exam General: NAD, A&O, resting, appropriate HEENT: /AT, EOMI, MMM Pulmonary: Normal work of breathing Cardiovascular: HDS, adequate peripheral perfusion Abdomen: Soft, nondistended Extremities: warm and well perfused, some lower extremity edema Neuro: Appropriate, no focal neurological deficits  Most Recent Labs: Lab Results  Component Value  Date   WBC 8.3 10/29/2017   HGB 12.4 (L) 10/29/2017   HCT 36.8 (L) 10/29/2017   PLT 235 10/29/2017    Lab Results  Component Value Date   NA 139 10/29/2017   K 4.6 10/29/2017   CL 102 10/29/2017   CO2 28 10/29/2017   BUN 20 10/29/2017   CREATININE 1.12 10/29/2017   CALCIUM 9.4 10/29/2017   MG 2.2 10/27/2017    Lab Results  Component Value Date   INR 1.34 10/27/2017     IMAGING: No results found.

## 2017-10-30 DIAGNOSIS — G4733 Obstructive sleep apnea (adult) (pediatric): Secondary | ICD-10-CM | POA: Diagnosis not present

## 2017-11-01 DIAGNOSIS — Z7901 Long term (current) use of anticoagulants: Secondary | ICD-10-CM | POA: Diagnosis not present

## 2017-11-01 DIAGNOSIS — R319 Hematuria, unspecified: Secondary | ICD-10-CM | POA: Diagnosis not present

## 2017-11-01 DIAGNOSIS — N281 Cyst of kidney, acquired: Secondary | ICD-10-CM | POA: Diagnosis not present

## 2017-11-01 DIAGNOSIS — I82409 Acute embolism and thrombosis of unspecified deep veins of unspecified lower extremity: Secondary | ICD-10-CM | POA: Diagnosis not present

## 2017-11-04 ENCOUNTER — Telehealth: Payer: Self-pay

## 2017-11-04 ENCOUNTER — Ambulatory Visit (INDEPENDENT_AMBULATORY_CARE_PROVIDER_SITE_OTHER): Payer: PPO

## 2017-11-04 DIAGNOSIS — Z95810 Presence of automatic (implantable) cardiac defibrillator: Secondary | ICD-10-CM | POA: Diagnosis not present

## 2017-11-04 DIAGNOSIS — I5022 Chronic systolic (congestive) heart failure: Secondary | ICD-10-CM

## 2017-11-04 NOTE — Telephone Encounter (Signed)
Remote ICM transmission received.  Attempted call to patient and left message to return call. 

## 2017-11-04 NOTE — Progress Notes (Signed)
EPIC Encounter for ICM Monitoring  Patient Name: Kevin Hart is a 72 y.o. male Date: 11/04/2017 Primary Care Physican: Guadlupe Spanish, MD Primary [place] Electrophysiologist: Allred Dry Weight: Previous weight 245lbs(baseline235-240 lbs)      Attempted call to patient and unable to reach.  Left message to return call.  Transmission reviewed. Marland Kitchen Hospitalized 8/6 to 8/9 and dx with right hemorrhagic renal cyst   Thoracic impedance abnormal suggesting fluid accumulation starting 11/01/2017 but showing slight trend up toward baseline.  Prescribed dosage: Furosemide 40 mg 1 tablet daily.  Labs: 10/29/2017 Creatinine 1.12, BUN 20, Potassium 4.6, Sodium 139, EGFR >60 10/28/2017 Creatinine 1.00, BUN 22, Potassium 4.1, Sodium 138, EGFR >60  10/27/2017 Creatinine 1.21, BUN 27, Potassium 4.3, Sodium 140, EGFR 59->60  10/26/2017 Creatinine 1.26, BUN 26, Potassium 4.0, Sodium 139, EGFR >60  10/25/2017 Creatinine 1.49, BUN 29, Potassium 3.8, Sodium 136, EGFR 45-52  07/19/2017 Creatinine 1.12, BUN 17, Potassium 4.0, Sodium 144, EGFR 65-75 04/28/2017 Creatinine 1.3, BUN 19, Potassium 3.9, Sodium 140  Recommendations: NONE - Unable to reach.  Follow-up plan: ICM clinic phone appointment on 11/08/2017 (manual send) to recheck fluid levels before office appointment scheduled 11/09/2017 with Almyra Deforest, PA.    Copy of ICM check sent to Dr. Rayann Heman and Dr Martinique for review and recommendation if needed.   3 month ICM trend: 11/04/2017    1 Year ICM trend:       Rosalene Billings, RN 11/04/2017 10:02 AM

## 2017-11-04 NOTE — Progress Notes (Addendum)
Patient returned call.  He received IV fluids during recent hospitalization.  Current weight is 237 lbs which is a decrease from previous weight of 245 lbs.  Discussed transmission and fluid accumulation.  He has follow up appointments for urology in the next couple of weeks.  Hgb dropped from 15 to 11 during hospitalization due to kidney was bleeding into cavity.  He is feeling better.  No changes today.  He will send remote transmission 8/19 for review and Almyra Deforest, [place] can review if needed for upcoming appointment on 8/20.

## 2017-11-08 ENCOUNTER — Ambulatory Visit (INDEPENDENT_AMBULATORY_CARE_PROVIDER_SITE_OTHER): Payer: PPO

## 2017-11-08 ENCOUNTER — Telehealth: Payer: Self-pay

## 2017-11-08 DIAGNOSIS — M545 Low back pain: Secondary | ICD-10-CM | POA: Diagnosis not present

## 2017-11-08 DIAGNOSIS — Z95810 Presence of automatic (implantable) cardiac defibrillator: Secondary | ICD-10-CM

## 2017-11-08 DIAGNOSIS — N281 Cyst of kidney, acquired: Secondary | ICD-10-CM | POA: Diagnosis not present

## 2017-11-08 DIAGNOSIS — I5022 Chronic systolic (congestive) heart failure: Secondary | ICD-10-CM

## 2017-11-08 DIAGNOSIS — I82409 Acute embolism and thrombosis of unspecified deep veins of unspecified lower extremity: Secondary | ICD-10-CM | POA: Diagnosis not present

## 2017-11-08 DIAGNOSIS — M419 Scoliosis, unspecified: Secondary | ICD-10-CM | POA: Diagnosis not present

## 2017-11-08 NOTE — Telephone Encounter (Signed)
Spoke with pt and reminded pt of remote transmission that is due today. Pt verbalized understanding.   

## 2017-11-09 ENCOUNTER — Encounter: Payer: Self-pay | Admitting: Physician Assistant

## 2017-11-09 ENCOUNTER — Ambulatory Visit: Payer: PPO | Admitting: Physician Assistant

## 2017-11-09 VITALS — BP 94/63 | HR 85 | Ht 72.0 in | Wt 240.0 lb

## 2017-11-09 DIAGNOSIS — I825Y9 Chronic embolism and thrombosis of unspecified deep veins of unspecified proximal lower extremity: Secondary | ICD-10-CM | POA: Diagnosis not present

## 2017-11-09 DIAGNOSIS — Z95810 Presence of automatic (implantable) cardiac defibrillator: Secondary | ICD-10-CM | POA: Diagnosis not present

## 2017-11-09 DIAGNOSIS — N2889 Other specified disorders of kidney and ureter: Secondary | ICD-10-CM

## 2017-11-09 DIAGNOSIS — E785 Hyperlipidemia, unspecified: Secondary | ICD-10-CM | POA: Diagnosis not present

## 2017-11-09 DIAGNOSIS — I5022 Chronic systolic (congestive) heart failure: Secondary | ICD-10-CM

## 2017-11-09 DIAGNOSIS — Z79899 Other long term (current) drug therapy: Secondary | ICD-10-CM

## 2017-11-09 DIAGNOSIS — I255 Ischemic cardiomyopathy: Secondary | ICD-10-CM

## 2017-11-09 DIAGNOSIS — N281 Cyst of kidney, acquired: Secondary | ICD-10-CM | POA: Diagnosis not present

## 2017-11-09 MED ORDER — ROSUVASTATIN CALCIUM 20 MG PO TABS: 20.0000 mg | ORAL_TABLET | Freq: Every day | ORAL | 3 refills | Status: DC

## 2017-11-09 NOTE — Progress Notes (Signed)
EPIC Encounter for ICM Monitoring  Patient Name: Kevin Hart is a 72 y.o. male Date: 11/09/2017 Primary Care Physican: Arvind, Moogali M, MD Primary Cardiologist:Jordan Electrophysiologist: Allred Dry Weight: (baseline235-240 lbs)       Heart Failure questions reviewed, pt asymptomatic and feeling better since being out of the hospital earlier this month.  Hospitalized 8/6 to 8/9 and dx with right hemorrhagic renal cyst   Thoracic impedance is close to baseline normal.  Impedance shows pattern of suggested fluid accumulation episodes of 7-10 days per month since April 2019.    Prescribed dosage: Furosemide 40 mg 1 tablet daily.  Labs: 10/29/2017 Creatinine 1.12, BUN 20, Potassium 4.6, Sodium 139, EGFR >60 10/28/2017 Creatinine 1.00, BUN 22, Potassium 4.1, Sodium 138, EGFR >60  10/27/2017 Creatinine 1.21, BUN 27, Potassium 4.3, Sodium 140, EGFR 59->60  10/26/2017 Creatinine 1.26, BUN 26, Potassium 4.0, Sodium 139, EGFR >60  10/25/2017 Creatinine 1.49, BUN 29, Potassium 3.8, Sodium 136, EGFR 45-52  07/19/2017 Creatinine 1.12, BUN 17, Potassium 4.0, Sodium 144, EGFR 65-75 04/28/2017 Creatinine 1.3, BUN 19, Potassium 3.9, Sodium 140  Recommendations: No changes.   Encouraged to call for fluid symptoms.  Follow-up plan: ICM clinic phone appointment on 12/13/2017.   Office appointment scheduled 11/09/2017 with Hao Meng, PA.    Copy of ICM check sent to Dr. Allred and Hao Meng, PA (office visit scheduled 11/09/2017).   3 month ICM trend: 11/08/2017    1 Year ICM trend:       Laurie S Short, RN 11/09/2017 7:39 AM   

## 2017-11-09 NOTE — Progress Notes (Signed)
Cardiology Office Note    Date:  11/09/2017   ID:  Regan, Llorente 1945-06-28, MRN [id number]  PCP:  Guadlupe Spanish, MD  Cardiologist: Dr. Martinique Sleep: Dr. Claiborne Billings Electrophysiologist: Dr. Rayann Heman  Chief Complaint  Patient presents with  . Hospitalization Follow-up    seen for Dr. Martinique.    History of Present Illness:  Kevin Hart is a 72 y.o. 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  He underwent DES to LAD for in-stent restenosis in April 2016, EF was 25 to 30% the time.  Repeat echocardiogram in May 2017 showed EF 30 to 35%.  He underwent ICD implantation by Dr. Rayann Heman in September 2016.  He was later diagnosed with obstructive sleep apnea at [facility].  Last Myoview obtained on 10/14/2016 showed EF 34%, no EKG changes, large defect of moderate severity present in the basal inferior, mid anterior, mid inferoseptal, mid inferior, apical anterior, apical septal and apical inferior location consistent with previous scar however very mild peri-infarct ischemia.  Patient was last seen by Dr. Martinique in February 2019, he was doing very well at the time but did complain infrequent fleeting chest pain.  Medication titration after multiple PCI has been difficult to given orthostasis and low blood pressure.  He has been referred to to the lipid clinic for consideration of PCSK9 inhibitor and Entresto.  He was eventually started on Entresto in April 2019.  Repeat echocardiogram obtained on 08/18/2017 showed EF 35 to 40%, mild LVH, wall motion abnormality, mild AI, trivial MR.  Patient was most recently admitted in August 2019 with right-sided abdominal pain.  CT of abdomen and pelvis showed a large heterogeneous lesion arising at the interpolar region of the right kidney.  He was diagnosed with right hemorrhagic renal cyst.   Vitamin K was given for right peripheral Coumadin.  Urology was consulted during the admission and recommended restarting Coumadin after 1 week.  Based on remote transmission from yesterday, his volume status has been very close to normal.  Patient presents today for cardiology office visit.  While his primary concern is he become very fatigued quickly when he climb up an incline.  He continued to have upper thigh pain with Lipitor.  I will discontinue his Lipitor and started on Crestor 20 mg daily in 2 weeks.  He will need a 6 to 8 weeks fasting lipid panel and LFT.  Our clinical pharmacist will continue to look into the possibility of PCSK9 inhibitor.  Otherwise given his recent fatigue and mild anemia, I will obtain a CBC and basic metabolic panel today.  His blood pressure continued to be borderline low, I will discontinue his eplerenone.  If at all possible, I would prefer him to stay on Entresto, however he is concerned that that Delene Loll is causing some of his current issue.  I reassured him that Delene Loll is unlikely to have caused the recent renal cyst hemorrhage.   Past Medical History:  Diagnosis Date  . Arthritis   . Bladder stone 10/26/2017  . BPH (benign prostatic hyperplasia)    takes Flomax daily  . CAD (coronary artery disease)    a. prior LAD stenting;  b. 06/2014 NSTEMI/PCI: LM nl, LAD 30p, 84m ISR (3.0x20 Promus DES), LCX nl, RCA nondom, nl, EF 25-30% ant, apical, dist inf AK.  . Cataracts, bilateral  immature  . Chronic systolic CHF (congestive heart failure) ([place]) 12/31/2014  . DVT (deep venous thrombosis) (HCC)    RECURRENT left leg  . Dyslipidemia   . GERD (gastroesophageal reflux disease)   . Gout   . History of kidney stones   . Hypertension   . Ischemic cardiomyopathy 07/06/2014  . Lupus anticoagulant positive   . S/P drug eluting coronary stent placement, 07/06/14, Promus 07/07/2014  . Sleep apnea   . Subarachnoid bleed ([place]) 2015   after falling and hitting his  head    Past Surgical History:  Procedure Laterality Date  . COLONOSCOPY    . EP IMPLANTABLE DEVICE [name] 11/27/2014   St. Jude Medical Fortify Assura VR  implanted by Dr Rayann Heman for primary prevention  . LEFT HEART CATHETERIZATION WITH CORONARY ANGIOGRAM [name] 07/06/2014   Procedure: LEFT HEART CATHETERIZATION WITH CORONARY ANGIOGRAM;  Surgeon: Peter M Martinique, MD;  Location: [facility] CATH LAB;  Service: Cardiovascular;  Laterality: [name];  . PERCUTANEOUS CORONARY STENT INTERVENTION (PCI-S) Right 07/06/2014   Procedure: PERCUTANEOUS CORONARY STENT INTERVENTION (PCI-S);  Surgeon: Peter M Martinique, MD;  Location: [facility] CATH LAB;  Service: Cardiovascular;  Laterality: Right;  . SHOULDER ARTHROSCOPY WITH SUBACROMIAL DECOMPRESSION Left 03/05/2016   Procedure: SHOULDER ARTHROSCOPY ROTATOR CUFF REPAIR WITH SUBACROMIAL DECOMPRESSION;  Surgeon: Tania Ade, MD;  Location: [place];  Service: Orthopedics;  Laterality: Left;  SHOULDER ARTHROSCOPY ROTATOR CUFF REPAIR WITH SUBACROMIAL DECOMPRESSION  . SHOULDER CLOSED REDUCTION Left 11/29/2015   Procedure: CLOSED REDUCTION SHOULDER;  Surgeon: Tania Ade, MD;  Location: [place];  Service: Orthopedics;  Laterality: Left;  . WRIST SURGERY Bilateral    plates and screws     Current Medications: Outpatient Medications Prior to Visit  Medication Sig Dispense Refill  . allopurinol (ZYLOPRIM) 300 MG tablet Take 1 tablet by mouth daily.    . carvedilol (COREG) 6.25 MG tablet Take 6.25 mg by mouth 2 (two) times daily with a meal.    . cholecalciferol (VITAMIN D) 1000 units tablet Take 1,000 Units by mouth daily.     . diclofenac sodium (VOLTAREN) 1 % GEL Apply 1 application topically 2 (two) times daily.    Marland Kitchen ENTRESTO 24-26 MG TAKE ONE TABLET BY MOUTH TWICE DAILY -TO REPLACE LISINOPRIL (START 36HOURS AFTER LAST DOSE) 60 tablet 5  . furosemide (LASIX) 40 MG tablet Take 40 mg by mouth daily.     . Multiple Vitamin (MULTI-VITAMINS) TABS Take 1 tablet by mouth daily.     .  nitroGLYCERIN (NITROSTAT) 0.4 MG SL tablet Place 1 tablet (0.4 mg total) under the tongue every 5 (five) minutes x 3 doses as needed for chest pain. 25 tablet 11  . pantoprazole (PROTONIX) 40 MG tablet Take 1 tablet (40 mg total) by mouth daily. 30 tablet 2  . tamsulosin (FLOMAX) 0.4 MG CAPS capsule Take 2 capsules (0.8 mg total) by mouth daily. 60 capsule 0  . warfarin (COUMADIN) 5 MG tablet Take 0.5-1 tablets (2.5-5 mg total) by mouth See admin instructions. Take 5 mg by mouth on Tuesday, Wednesday, Thursday, Saturday and Sunday then take 2.5 mg by mouth on Monday and Friday    . atorvastatin (LIPITOR) 80 MG tablet Take 1 tablet (80 mg total) by mouth daily at 6 PM. 30 tablet 6  . eplerenone (INSPRA) 25 MG tablet Take 25 mg by mouth daily.     No facility-administered medications prior to visit.      Allergies:   No known allergies   Social History  Socioeconomic History  . Marital status: Married    Spouse name: Not on file  . Number of children: 6  . Years of education: Not on file  . Highest education level: Not on file  Occupational History  . Not on file  Social Needs  . Financial resource strain: Not on file  . Food insecurity:    Worry: Not on file    Inability: Not on file  . Transportation needs:    Medical: Not on file    Non-medical: Not on file  Tobacco Use  . Smoking status: Never Smoker  . Smokeless tobacco: Never Used  Substance and Sexual Activity  . Alcohol use: No    Alcohol/week: 0.0 standard drinks  . Drug use: No  . Sexual activity: Yes    Birth control/protection: None  Lifestyle  . Physical activity:    Days per week: Not on file    Minutes per session: Not on file  . Stress: Not on file  Relationships  . Social connections:    Talks on phone: Not on file    Gets together: Not on file    Attends religious service: Not on file    Active member of club or organization: Not on file    Attends meetings of clubs or organizations: Not on file     Relationship status: Not on file  Other Topics Concern  . Not on file  Social History Narrative   Pt lives in [place] with spouse.  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     Family History:  The patient's family history includes Aneurysm in his father.   ROS:   Please see the history of present illness.    ROS All other systems reviewed and are negative.   PHYSICAL EXAM:   VS:  BP 94/63   Pulse 85   Ht 6' (1.829 m)   Wt 240 lb (108.9 kg)   BMI 32.55 kg/m    GEN: Well nourished, well developed, in no acute distress  HEENT: normal  Neck: no JVD, carotid bruits, or masses Cardiac: RRR; no murmurs, rubs, or gallops,no edema  Respiratory:  clear to auscultation bilaterally, normal work of breathing GI: soft, nontender, nondistended, + BS MS: no deformity or atrophy  Skin: warm and dry, no rash Neuro:  Alert and Oriented x 3, Strength and sensation are intact Psych: euthymic mood, full affect  Wt Readings from Last 3 Encounters:  11/09/17 240 lb (108.9 kg)  10/28/17 250 lb (113.4 kg)  05/13/17 245 lb 3.2 oz (111.2 kg)      Studies/Labs Reviewed:   EKG:  EKG is not ordered today.    Recent Labs: 10/27/2017: ALT 18; Magnesium 2.2 10/29/2017: BUN 20; Creatinine, Ser 1.12; Hemoglobin 12.4; Platelets 235; Potassium 4.6; Sodium 139   Lipid Panel    Component Value Date/Time   CHOL 113 07/25/2014 1029   TRIG 122.0 07/25/2014 1029   HDL 31.90 (L) 07/25/2014 1029   CHOLHDL 4 07/25/2014 1029   VLDL 24.4 07/25/2014 1029   LDLCALC 57 07/25/2014 1029    Additional studies/ records that were reviewed today include:   Echo 08/18/2017 LV EF: 35% -   40% Study Conclusions  - Left ventricle: The cavity size was normal. Wall thickness was   increased in a pattern of mild LVH. Systolic function was   moderately reduced. The estimated ejection fraction was in the   range of 35% to 40%. Anterior, anteroseptal, apical and   inferoapical hypokinesis.  Doppler parameters  are consistent with   abnormal left ventricular relaxation (grade 1 diastolic   dysfunction). The E/e&' ratio is <8, suggesting normal LV filling   pressure. - Aortic valve: Sclerosis without stenosis. There was mild   regurgitation. - Mitral valve: Mildly thickened leaflets . There was trivial   regurgitation. - Left atrium: The atrium was normal in size. - Right ventricle: The cavity size was mildly dilated. Pacer or   AICD wire in right ventricle. Systolic function was normal. - Right atrium: The atrium was mildly dilated. Pacer or AICD wire   noted in right atrium. - Tricuspid valve: There was trivial regurgitation. - Pulmonary arteries: PA peak pressure: 25 mm Hg (S). - Inferior vena cava: The vessel was normal in size. The   respirophasic diameter changes were in the normal range (= 50%),   consistent with normal central venous pressure.  Impressions:  - Compared to a prior study in 07/2015, the LVEF has slightly   improved to 35-40% with persistent LAD territory wall motion   abnormalities.     ASSESSMENT:    1. Chronic systolic CHF (congestive heart failure) ([place])   2. ICD (implantable cardioverter-defibrillator) in place   3. Cardiomyopathy, ischemic   4. Dyslipidemia   5. Medication management   6. Renal cyst, native, hemorrhage   7. Chronic deep vein thrombosis (DVT) of proximal vein of lower extremity, unspecified laterality (HCC)      PLAN:  In order of problems listed above:  1. Chronic systolic heart failure: Due to history of ischemic cardiomyopathy.  Euvolemic based on physical exam.  Continue carvedilol and Entresto.  Due to the recent fatigue and the low blood pressure, I will discontinue his eplerenone.  CBC and basic metabolic panel today  2. Ischemic cardiomyopathy s/p ICD: EF improved to 35 to 40% on recent echocardiogram.  3. CAD: Denies any anginal symptoms.  Not on aspirin given the need for Coumadin.    4. Hyperlipidemia: Continue to  complain of upper thigh pain on the Lipitor, will discontinue Lipitor and wait 2 weeks before restarting Crestor 20 mg daily.  Need 6 to 8 weeks fasting lipid panel and LFT.  Our clinical pharmacist is also looking into the possibility of PCSK9 inhibitor  5. Renal cyst hemorrhage: Occurred recently, obtain CBC to make sure hemoglobin is stable.  Was only restarted on Coumadin yesterday  6. History of recurrent DVT with lupus anticoagulant positive: Continue on Coumadin.    Medication Adjustments/Labs and Tests Ordered: Current medicines are reviewed at length with the patient today.  Concerns regarding medicines are outlined above.  Medication changes, Labs and Tests ordered today are listed in the Patient Instructions below. Patient Instructions  Medication Instructions:  STOP eplerenone  STOP Lipitor  In 2 weeks --Start rosuvastatin (Crestor) 20 mg daily  Labwork: Today (BMET, CBC)  Please return for FASTING labs in 6-8 weeks after starting Crestor (Lipid, Hepatic)  Our in office lab hours are Monday-Friday 8:00-4:00, closed for lunch 12:45-1:45 pm.  No appointment needed.  Follow-Up: 3-4 months with Dr. Martinique  Any Other Special Instructions Will Be Listed Below (If Applicable).  Our pharmacist will contact you in regards to starting new medication for cholesterol (PCSK9)   If you need a refill on your cardiac medications before your next appointment, please call your pharmacy.      Kevin Hart, [place]  11/09/2017 4:21 PM    [facility] [address], [place], [place]  [address]  Phone: [phone]; Fax: [phone]

## 2017-11-09 NOTE — Patient Instructions (Signed)
Medication Instructions:  STOP eplerenone  STOP Lipitor  In 2 weeks --Start rosuvastatin (Crestor) 20 mg daily  Labwork: Today (BMET, CBC)  Please return for FASTING labs in 6-8 weeks after starting Crestor (Lipid, Hepatic)  Our in office lab hours are Monday-Friday 8:00-4:00, closed for lunch 12:45-1:45 pm.  No appointment needed.  Follow-Up: 3-4 months with Dr. Martinique  Any Other Special Instructions Will Be Listed Below (If Applicable).  Our pharmacist will contact you in regards to starting new medication for cholesterol (PCSK9)   If you need a refill on your cardiac medications before your next appointment, please call your pharmacy.

## 2017-11-10 LAB — BASIC METABOLIC PANEL
BUN / CREAT RATIO: 17 (ref 10–24)
BUN: 21 mg/dL (ref 8–27)
CHLORIDE: 99 mmol/L (ref 96–106)
CO2: 24 mmol/L (ref 20–29)
Calcium: 9.8 mg/dL (ref 8.6–10.2)
Creatinine, Ser: 1.26 mg/dL (ref 0.76–1.27)
GFR calc non Af Amer: 56 mL/min/{1.73_m2} — ABNORMAL LOW (ref >59)
GFR, EST AFRICAN AMERICAN: 65 mL/min/{1.73_m2} (ref >59)
GLUCOSE: 118 mg/dL — AB (ref 65–99)
Potassium: 4.3 mmol/L (ref 3.5–5.2)
SODIUM: 138 mmol/L (ref 134–144)

## 2017-11-10 LAB — CBC
Hematocrit: 37.1 % — ABNORMAL LOW (ref 37.5–51.0)
Hemoglobin: 11.9 g/dL — ABNORMAL LOW (ref 13.0–17.7)
MCH: 29.8 pg (ref 26.6–33.0)
MCHC: 32.1 g/dL (ref 31.5–35.7)
MCV: 93 fL (ref 79–97)
PLATELETS: 400 10*3/uL (ref 150–450)
RBC: 4 x10E6/uL — ABNORMAL LOW (ref 4.14–5.80)
RDW: 14.4 % (ref 12.3–15.4)
WBC: 9.2 10*3/uL (ref 3.4–10.8)

## 2017-11-12 DIAGNOSIS — M62830 Muscle spasm of back: Secondary | ICD-10-CM | POA: Diagnosis not present

## 2017-11-12 DIAGNOSIS — G8929 Other chronic pain: Secondary | ICD-10-CM | POA: Diagnosis not present

## 2017-11-12 DIAGNOSIS — M545 Low back pain: Secondary | ICD-10-CM | POA: Diagnosis not present

## 2017-11-12 DIAGNOSIS — M419 Scoliosis, unspecified: Secondary | ICD-10-CM | POA: Diagnosis not present

## 2017-11-15 DIAGNOSIS — Z7901 Long term (current) use of anticoagulants: Secondary | ICD-10-CM | POA: Diagnosis not present

## 2017-11-15 DIAGNOSIS — N281 Cyst of kidney, acquired: Secondary | ICD-10-CM | POA: Diagnosis not present

## 2017-11-15 DIAGNOSIS — R319 Hematuria, unspecified: Secondary | ICD-10-CM | POA: Diagnosis not present

## 2017-11-16 DIAGNOSIS — M419 Scoliosis, unspecified: Secondary | ICD-10-CM | POA: Diagnosis not present

## 2017-11-16 DIAGNOSIS — G8929 Other chronic pain: Secondary | ICD-10-CM | POA: Diagnosis not present

## 2017-11-16 DIAGNOSIS — M62830 Muscle spasm of back: Secondary | ICD-10-CM | POA: Diagnosis not present

## 2017-11-16 DIAGNOSIS — M545 Low back pain: Secondary | ICD-10-CM | POA: Diagnosis not present

## 2017-11-18 DIAGNOSIS — G8929 Other chronic pain: Secondary | ICD-10-CM | POA: Diagnosis not present

## 2017-11-18 DIAGNOSIS — M62830 Muscle spasm of back: Secondary | ICD-10-CM | POA: Diagnosis not present

## 2017-11-18 DIAGNOSIS — M545 Low back pain: Secondary | ICD-10-CM | POA: Diagnosis not present

## 2017-11-18 DIAGNOSIS — M419 Scoliosis, unspecified: Secondary | ICD-10-CM | POA: Diagnosis not present

## 2017-11-19 DIAGNOSIS — G8929 Other chronic pain: Secondary | ICD-10-CM | POA: Diagnosis not present

## 2017-11-19 DIAGNOSIS — M62830 Muscle spasm of back: Secondary | ICD-10-CM | POA: Diagnosis not present

## 2017-11-19 DIAGNOSIS — M545 Low back pain: Secondary | ICD-10-CM | POA: Diagnosis not present

## 2017-11-19 DIAGNOSIS — M419 Scoliosis, unspecified: Secondary | ICD-10-CM | POA: Diagnosis not present

## 2017-11-23 DIAGNOSIS — N281 Cyst of kidney, acquired: Secondary | ICD-10-CM | POA: Diagnosis not present

## 2017-11-23 DIAGNOSIS — N289 Disorder of kidney and ureter, unspecified: Secondary | ICD-10-CM | POA: Diagnosis not present

## 2017-11-23 DIAGNOSIS — D49511 Neoplasm of unspecified behavior of right kidney: Secondary | ICD-10-CM | POA: Diagnosis not present

## 2017-11-24 DIAGNOSIS — M545 Low back pain: Secondary | ICD-10-CM | POA: Diagnosis not present

## 2017-11-24 DIAGNOSIS — G8929 Other chronic pain: Secondary | ICD-10-CM | POA: Diagnosis not present

## 2017-11-24 DIAGNOSIS — M419 Scoliosis, unspecified: Secondary | ICD-10-CM | POA: Diagnosis not present

## 2017-11-24 DIAGNOSIS — M62830 Muscle spasm of back: Secondary | ICD-10-CM | POA: Diagnosis not present

## 2017-11-25 ENCOUNTER — Ambulatory Visit (INDEPENDENT_AMBULATORY_CARE_PROVIDER_SITE_OTHER): Payer: PPO | Admitting: *Deleted

## 2017-11-25 DIAGNOSIS — I5022 Chronic systolic (congestive) heart failure: Secondary | ICD-10-CM

## 2017-11-25 DIAGNOSIS — I255 Ischemic cardiomyopathy: Secondary | ICD-10-CM | POA: Diagnosis not present

## 2017-11-25 DIAGNOSIS — G8929 Other chronic pain: Secondary | ICD-10-CM | POA: Diagnosis not present

## 2017-11-25 DIAGNOSIS — M545 Low back pain: Secondary | ICD-10-CM | POA: Diagnosis not present

## 2017-11-25 DIAGNOSIS — M62830 Muscle spasm of back: Secondary | ICD-10-CM | POA: Diagnosis not present

## 2017-11-25 DIAGNOSIS — M419 Scoliosis, unspecified: Secondary | ICD-10-CM | POA: Diagnosis not present

## 2017-11-25 NOTE — Progress Notes (Signed)
Remote ICD transmission.   

## 2017-11-26 DIAGNOSIS — G8929 Other chronic pain: Secondary | ICD-10-CM | POA: Diagnosis not present

## 2017-11-26 DIAGNOSIS — M419 Scoliosis, unspecified: Secondary | ICD-10-CM | POA: Diagnosis not present

## 2017-11-26 DIAGNOSIS — M62830 Muscle spasm of back: Secondary | ICD-10-CM | POA: Diagnosis not present

## 2017-11-26 DIAGNOSIS — M545 Low back pain: Secondary | ICD-10-CM | POA: Diagnosis not present

## 2017-11-29 ENCOUNTER — Other Ambulatory Visit: Payer: Self-pay

## 2017-11-29 DIAGNOSIS — M129 Arthropathy, unspecified: Secondary | ICD-10-CM | POA: Diagnosis not present

## 2017-11-29 DIAGNOSIS — G8929 Other chronic pain: Secondary | ICD-10-CM | POA: Diagnosis not present

## 2017-11-29 DIAGNOSIS — R5383 Other fatigue: Secondary | ICD-10-CM | POA: Diagnosis not present

## 2017-11-29 DIAGNOSIS — E7800 Pure hypercholesterolemia, unspecified: Secondary | ICD-10-CM | POA: Diagnosis not present

## 2017-11-29 DIAGNOSIS — I10 Essential (primary) hypertension: Secondary | ICD-10-CM | POA: Diagnosis not present

## 2017-11-29 DIAGNOSIS — M62830 Muscle spasm of back: Secondary | ICD-10-CM | POA: Diagnosis not present

## 2017-11-29 DIAGNOSIS — E559 Vitamin D deficiency, unspecified: Secondary | ICD-10-CM | POA: Diagnosis not present

## 2017-11-29 DIAGNOSIS — M419 Scoliosis, unspecified: Secondary | ICD-10-CM | POA: Diagnosis not present

## 2017-11-29 DIAGNOSIS — Z7901 Long term (current) use of anticoagulants: Secondary | ICD-10-CM | POA: Diagnosis not present

## 2017-11-29 DIAGNOSIS — R300 Dysuria: Secondary | ICD-10-CM | POA: Diagnosis not present

## 2017-11-29 DIAGNOSIS — E291 Testicular hypofunction: Secondary | ICD-10-CM | POA: Diagnosis not present

## 2017-11-29 DIAGNOSIS — M545 Low back pain: Secondary | ICD-10-CM | POA: Diagnosis not present

## 2017-11-29 DIAGNOSIS — D539 Nutritional anemia, unspecified: Secondary | ICD-10-CM | POA: Diagnosis not present

## 2017-11-29 MED ORDER — SACUBITRIL-VALSARTAN 24-26 MG PO TABS: ORAL_TABLET | ORAL | 5 refills | Status: DC

## 2017-12-01 DIAGNOSIS — M545 Low back pain: Secondary | ICD-10-CM | POA: Diagnosis not present

## 2017-12-01 DIAGNOSIS — G8929 Other chronic pain: Secondary | ICD-10-CM | POA: Diagnosis not present

## 2017-12-01 DIAGNOSIS — M62830 Muscle spasm of back: Secondary | ICD-10-CM | POA: Diagnosis not present

## 2017-12-01 DIAGNOSIS — M419 Scoliosis, unspecified: Secondary | ICD-10-CM | POA: Diagnosis not present

## 2017-12-02 DIAGNOSIS — N401 Enlarged prostate with lower urinary tract symptoms: Secondary | ICD-10-CM | POA: Diagnosis not present

## 2017-12-02 DIAGNOSIS — R3914 Feeling of incomplete bladder emptying: Secondary | ICD-10-CM | POA: Diagnosis not present

## 2017-12-02 DIAGNOSIS — N281 Cyst of kidney, acquired: Secondary | ICD-10-CM | POA: Diagnosis not present

## 2017-12-03 DIAGNOSIS — M419 Scoliosis, unspecified: Secondary | ICD-10-CM | POA: Diagnosis not present

## 2017-12-03 DIAGNOSIS — M62830 Muscle spasm of back: Secondary | ICD-10-CM | POA: Diagnosis not present

## 2017-12-03 DIAGNOSIS — G8929 Other chronic pain: Secondary | ICD-10-CM | POA: Diagnosis not present

## 2017-12-03 DIAGNOSIS — M545 Low back pain: Secondary | ICD-10-CM | POA: Diagnosis not present

## 2017-12-06 DIAGNOSIS — M419 Scoliosis, unspecified: Secondary | ICD-10-CM | POA: Diagnosis not present

## 2017-12-06 DIAGNOSIS — M545 Low back pain: Secondary | ICD-10-CM | POA: Diagnosis not present

## 2017-12-06 DIAGNOSIS — G8929 Other chronic pain: Secondary | ICD-10-CM | POA: Diagnosis not present

## 2017-12-06 DIAGNOSIS — M62830 Muscle spasm of back: Secondary | ICD-10-CM | POA: Diagnosis not present

## 2017-12-06 DIAGNOSIS — R3914 Feeling of incomplete bladder emptying: Secondary | ICD-10-CM | POA: Diagnosis not present

## 2017-12-08 DIAGNOSIS — M545 Low back pain: Secondary | ICD-10-CM | POA: Diagnosis not present

## 2017-12-08 DIAGNOSIS — M62830 Muscle spasm of back: Secondary | ICD-10-CM | POA: Diagnosis not present

## 2017-12-08 DIAGNOSIS — G8929 Other chronic pain: Secondary | ICD-10-CM | POA: Diagnosis not present

## 2017-12-08 DIAGNOSIS — M419 Scoliosis, unspecified: Secondary | ICD-10-CM | POA: Diagnosis not present

## 2017-12-10 DIAGNOSIS — M62830 Muscle spasm of back: Secondary | ICD-10-CM | POA: Diagnosis not present

## 2017-12-10 DIAGNOSIS — G4733 Obstructive sleep apnea (adult) (pediatric): Secondary | ICD-10-CM | POA: Diagnosis not present

## 2017-12-10 DIAGNOSIS — G8929 Other chronic pain: Secondary | ICD-10-CM | POA: Diagnosis not present

## 2017-12-10 DIAGNOSIS — M545 Low back pain: Secondary | ICD-10-CM | POA: Diagnosis not present

## 2017-12-10 DIAGNOSIS — M419 Scoliosis, unspecified: Secondary | ICD-10-CM | POA: Diagnosis not present

## 2017-12-13 ENCOUNTER — Ambulatory Visit (INDEPENDENT_AMBULATORY_CARE_PROVIDER_SITE_OTHER): Payer: PPO

## 2017-12-13 DIAGNOSIS — Z95810 Presence of automatic (implantable) cardiac defibrillator: Secondary | ICD-10-CM

## 2017-12-13 DIAGNOSIS — R3914 Feeling of incomplete bladder emptying: Secondary | ICD-10-CM | POA: Diagnosis not present

## 2017-12-13 DIAGNOSIS — I5022 Chronic systolic (congestive) heart failure: Secondary | ICD-10-CM | POA: Diagnosis not present

## 2017-12-13 DIAGNOSIS — N210 Calculus in bladder: Secondary | ICD-10-CM | POA: Diagnosis not present

## 2017-12-13 DIAGNOSIS — N401 Enlarged prostate with lower urinary tract symptoms: Secondary | ICD-10-CM | POA: Diagnosis not present

## 2017-12-13 NOTE — Progress Notes (Signed)
EPIC Encounter for ICM Monitoring  Patient Name: Kevin Hart is a 72 y.o. male Date: 12/13/2017 Primary Care Physican: Guadlupe Spanish, MD Primary [place] Electrophysiologist: Allred Dry Weight:230 lbs(baseline235-240 lbs)         Heart Failure questions reviewed, pt asymptomatic.   Thoracic impedance normal but was abnormal suggesting fluid accumulation from 12/05/2017 - 12/10/2017.  Prescribed: Furosemide 40 mg 1 tablet daily.  Labs: 11/09/2017 Creatinine 1.26, BUN 21, Potassium 4.3, Sodium 138, EGFR 56-65 10/29/2017 Creatinine1.12, BUN20, Potassium4.6, Sodium139, EGFR>60 10/28/2017 Creatinine1.00, BUN22, Potassium4.1, Sodium138, EGFR>60  10/27/2017 Creatinine1.21, BUN27, Potassium4.3, Sodium140, EGFR59->60  10/26/2017 Creatinine1.26, BUN26, Potassium4.0, Sodium139, EGFR>60  10/25/2017 Creatinine1.49, BUN29, Potassium3.8, [id number], [id number] 07/19/2017 Creatinine 1.12, BUN 17, Potassium 4.0, Sodium 144, EGFR 65-75 04/28/2017 Creatinine 1.3, BUN 19, Potassium 3.9, Sodium 140  Recommendations: No changes.   Encouraged to call for fluid symptoms.  Follow-up plan: ICM clinic phone appointment on 01/13/2018.    Copy of ICM check sent to Dr. Rayann Heman.   3 month ICM trend: 12/13/2017    1 Year ICM trend:       Rosalene Billings, RN 12/13/2017 12:52 PM

## 2017-12-14 DIAGNOSIS — I82409 Acute embolism and thrombosis of unspecified deep veins of unspecified lower extremity: Secondary | ICD-10-CM | POA: Diagnosis not present

## 2017-12-14 DIAGNOSIS — I10 Essential (primary) hypertension: Secondary | ICD-10-CM | POA: Diagnosis not present

## 2017-12-14 DIAGNOSIS — M62830 Muscle spasm of back: Secondary | ICD-10-CM | POA: Diagnosis not present

## 2017-12-14 DIAGNOSIS — Z23 Encounter for immunization: Secondary | ICD-10-CM | POA: Diagnosis not present

## 2017-12-14 DIAGNOSIS — Z7901 Long term (current) use of anticoagulants: Secondary | ICD-10-CM | POA: Diagnosis not present

## 2017-12-14 DIAGNOSIS — M419 Scoliosis, unspecified: Secondary | ICD-10-CM | POA: Diagnosis not present

## 2017-12-14 DIAGNOSIS — M545 Low back pain: Secondary | ICD-10-CM | POA: Diagnosis not present

## 2017-12-14 DIAGNOSIS — I509 Heart failure, unspecified: Secondary | ICD-10-CM | POA: Diagnosis not present

## 2017-12-14 DIAGNOSIS — G8929 Other chronic pain: Secondary | ICD-10-CM | POA: Diagnosis not present

## 2017-12-15 DIAGNOSIS — G8929 Other chronic pain: Secondary | ICD-10-CM | POA: Diagnosis not present

## 2017-12-15 DIAGNOSIS — M62830 Muscle spasm of back: Secondary | ICD-10-CM | POA: Diagnosis not present

## 2017-12-15 DIAGNOSIS — M545 Low back pain: Secondary | ICD-10-CM | POA: Diagnosis not present

## 2017-12-15 DIAGNOSIS — M419 Scoliosis, unspecified: Secondary | ICD-10-CM | POA: Diagnosis not present

## 2017-12-16 ENCOUNTER — Other Ambulatory Visit: Payer: Self-pay | Admitting: *Deleted

## 2017-12-16 DIAGNOSIS — E785 Hyperlipidemia, unspecified: Secondary | ICD-10-CM

## 2017-12-16 DIAGNOSIS — Z79899 Other long term (current) drug therapy: Secondary | ICD-10-CM

## 2017-12-16 LAB — LIPID PANEL
CHOL/HDL RATIO: 3.5 ratio (ref 0.0–5.0)
Cholesterol, Total: 140 mg/dL (ref 100–199)
HDL: 40 mg/dL (ref >39)
LDL Calculated: 78 mg/dL (ref 0–99)
Triglycerides: 111 mg/dL (ref 0–149)
VLDL Cholesterol Cal: 22 mg/dL (ref 5–40)

## 2017-12-16 LAB — HEPATIC FUNCTION PANEL
ALK PHOS: 43 IU/L (ref 39–117)
ALT: 16 IU/L (ref 0–44)
AST: 22 IU/L (ref 0–40)
Albumin: 3.9 g/dL (ref 3.5–4.8)
BILIRUBIN TOTAL: 0.8 mg/dL (ref 0.0–1.2)
BILIRUBIN, DIRECT: 0.22 mg/dL (ref 0.00–0.40)
Total Protein: 6.5 g/dL (ref 6.0–8.5)

## 2017-12-17 NOTE — Progress Notes (Signed)
Good cholesterol well controlled, bad cholesterol right over the borderline, liver normal. Recommend improve diet and exercise.

## 2017-12-21 LAB — CUP PACEART REMOTE DEVICE CHECK
Battery Remaining Longevity: 78 mo
Battery Remaining Percentage: 72 %
HIGH POWER IMPEDANCE MEASURED VALUE: 78 Ohm
HIGH POWER IMPEDANCE MEASURED VALUE: 78 Ohm
Implantable Pulse Generator Implant Date: 20160906
Lead Channel Impedance Value: 450 Ohm
Lead Channel Pacing Threshold Pulse Width: 0.5 ms
Lead Channel Setting Pacing Amplitude: 2.5 V
Lead Channel Setting Pacing Pulse Width: 0.5 ms
MDC IDC LEAD IMPLANT DT: 20160906
MDC IDC LEAD LOCATION: 753860
MDC IDC MSMT BATTERY VOLTAGE: 3.01 V
MDC IDC MSMT LEADCHNL RV PACING THRESHOLD AMPLITUDE: 0.75 V
MDC IDC MSMT LEADCHNL RV SENSING INTR AMPL: 9.3 mV
MDC IDC SESS DTM: 20190905060017
MDC IDC SET LEADCHNL RV SENSING SENSITIVITY: 0.5 mV
MDC IDC STAT BRADY RV PERCENT PACED: 1 %
Pulse Gen Serial Number: 7257239

## 2017-12-24 ENCOUNTER — Other Ambulatory Visit: Payer: Self-pay | Admitting: Urology

## 2018-01-13 ENCOUNTER — Ambulatory Visit (INDEPENDENT_AMBULATORY_CARE_PROVIDER_SITE_OTHER): Payer: PPO

## 2018-01-13 ENCOUNTER — Telehealth: Payer: Self-pay

## 2018-01-13 DIAGNOSIS — Z95810 Presence of automatic (implantable) cardiac defibrillator: Secondary | ICD-10-CM | POA: Diagnosis not present

## 2018-01-13 DIAGNOSIS — I5022 Chronic systolic (congestive) heart failure: Secondary | ICD-10-CM | POA: Diagnosis not present

## 2018-01-13 NOTE — Progress Notes (Signed)
EPIC Encounter for ICM Monitoring  Patient Name: Kevin Hart is a 72 y.o. male Date: 01/13/2018 Primary Care Physican: Arvind, Moogali M, MD Primary Cardiologist:[place] Electrophysiologist: Allred Dry Weight:Previous weight 230 lbs(baseline235-240 lbs)        Attempted call to patient and unable to reach.  Left detailed message, per DPR, regarding transmission.  Transmission reviewed.    Thoracic impedance normal.   Prescribed: Furosemide 40 mg 1 tablet daily.  Labs: 11/09/2017 Creatinine 1.26, BUN 21, Potassium 4.3, Sodium 138, EGFR 56-65 10/29/2017 Creatinine1.12, BUN20, Potassium4.6, Sodium139, EGFR>60 10/28/2017 Creatinine1.00, BUN22, Potassium4.1, Sodium138, EGFR>60  10/27/2017 Creatinine1.21, BUN27, Potassium4.3, Sodium140, EGFR59->60  10/26/2017 Creatinine1.26, BUN26, Potassium4.0, Sodium139, EGFR>60  10/25/2017 Creatinine1.49, BUN29, Potassium3.8, Sodium136, EGFR45-52 07/19/2017 Creatinine 1.12, BUN 17, Potassium 4.0, Sodium 144, EGFR 65-75 04/28/2017 Creatinine 1.3, BUN 19, Potassium 3.9, Sodium 140  Recommendations: Left voice mail with ICM number and encouraged to call if experiencing any fluid symptoms.  Follow-up plan: ICM clinic phone appointment on 02/14/2018.    Copy of ICM check sent to Dr. Allred.   3 month ICM trend: 01/13/2018    1 Year ICM trend:       Laurie S Short, RN 01/13/2018 4:49 PM   

## 2018-01-13 NOTE — Telephone Encounter (Signed)
Remote ICM transmission received.  Attempted call to patient regarding ICM remote transmission and left detailed message, per DPR, with next ICM remote transmission date of 02/14/2018.  Advised to return call for any fluid symptoms or questions.    

## 2018-02-14 ENCOUNTER — Ambulatory Visit (INDEPENDENT_AMBULATORY_CARE_PROVIDER_SITE_OTHER): Payer: PPO

## 2018-02-14 DIAGNOSIS — Z95810 Presence of automatic (implantable) cardiac defibrillator: Secondary | ICD-10-CM

## 2018-02-14 DIAGNOSIS — I5022 Chronic systolic (congestive) heart failure: Secondary | ICD-10-CM | POA: Diagnosis not present

## 2018-02-15 NOTE — Progress Notes (Signed)
EPIC Encounter for ICM Monitoring  Patient Name: Kevin Hart is a 72 y.o. male Date: 02/15/2018 Primary Care Physican: Guadlupe Spanish, MD Primary [place] Electrophysiologist: Allred Last weight 230 lbs(baseline235-240 lbs)  Today's Weight: 230 lbs        Heart Failure questions reviewed, pt asymptomatic.  Prostate surgery scheduled 02/24/2018.  Advised to call if he had any fluid symptoms after discharge.    Thoracic impedance slightly below baseline.   Prescribed: Furosemide 40 mg 1 tablet daily.  Labs: 11/09/2017 Creatinine 1.26, BUN 21, Potassium 4.3, Sodium 138, EGFR 56-65 10/29/2017 Creatinine1.12, BUN20, Potassium4.6, Sodium139, EGFR>60 10/28/2017 Creatinine1.00, BUN22, Potassium4.1, Sodium138, EGFR>60  10/27/2017 Creatinine1.21, BUN27, Potassium4.3, Sodium140, EGFR59->60  10/26/2017 Creatinine1.26, BUN26, Potassium4.0, Sodium139, EGFR>60  10/25/2017 Creatinine1.49, BUN29, Potassium3.8, [id number], [id number] 07/19/2017 Creatinine 1.12, BUN 17, Potassium 4.0, Sodium 144, EGFR 65-75 04/28/2017 Creatinine 1.3, BUN 19, Potassium 3.9, Sodium 140  Recommendations: No changes.   Encouraged to call for fluid symptoms.  Follow-up plan: ICM clinic phone appointment on 03/28/2018.   Office appointment scheduled 03/08/2018 with Dr. Martinique.    Copy of ICM check sent to Dr. Rayann Heman and Dr Martinique.   3 month ICM trend: 02/15/2018    1 Year ICM trend:       Rosalene Billings, RN 02/15/2018 1:58 PM

## 2018-02-16 NOTE — Patient Instructions (Addendum)
Kevin Hart  02/16/2018   Your procedure is scheduled on: 02-24-18   Report to [facility]  Entrance    Report to admitting at 5:30AM    Call this number if you have problems the morning of surgery [phone]   PLEASE BRING BiPAP MASK AND  TUBING ONLY. DEVICE WILL BE PROVIDED!    Remember: Do not eat food or drink liquids :After Midnight. BRUSH YOUR TEETH MORNING OF SURGERY AND RINSE YOUR MOUTH OUT, NO CHEWING GUM CANDY OR MINTS.     Take these medicines the morning of surgery with A SIP OF WATER: allopurinol, tamsulosin, pantoprazole, rosuvastatin                                 You may not have any metal on your body including hair pins and              piercings  Do not wear jewelry, make-up, lotions, powders or perfumes, deodorant                     Men may shave face and neck.   Do not bring valuables to the hospital. [place].  Contacts, dentures or bridgework may not be worn into surgery.  Leave suitcase in the car. After surgery it may be brought to your room.                 Please read over the following fact sheets you were given: _____________________________________________________________________             [facility] - Preparing for Surgery Before surgery, you can play an important role.  Because skin is not sterile, your skin needs to be as free of germs as possible.  You can reduce the number of germs on your skin by washing with CHG (chlorahexidine gluconate) soap before surgery.  CHG is an antiseptic cleaner which kills germs and bonds with the skin to continue killing germs even after washing. Please DO NOT use if you have an allergy to CHG or antibacterial soaps.  If your skin becomes reddened/irritated stop using the CHG and inform your nurse when you arrive at Short Stay. Do not shave (including legs and underarms) for at least 48 hours prior to the first CHG shower.   You may shave your face/neck. Please follow these instructions carefully:  1.  Shower with CHG Soap the night before surgery and the  morning of Surgery.  2.  If you choose to wash your hair, wash your hair first as usual with your  normal  shampoo.  3.  After you shampoo, rinse your hair and body thoroughly to remove the  shampoo.                           4.  Use CHG as you would any other liquid soap.  You can apply chg directly  to the skin and wash                       Gently with a scrungie or clean washcloth.  5.  Apply the CHG Soap to your body ONLY FROM THE NECK DOWN.  Do not use on face/ open                           Wound or open sores. Avoid contact with eyes, ears mouth and genitals (private parts).                       Wash face,  Genitals (private parts) with your normal soap.             6.  Wash thoroughly, paying special attention to the area where your surgery  will be performed.  7.  Thoroughly rinse your body with warm water from the neck down.  8.  DO NOT shower/wash with your normal soap after using and rinsing off  the CHG Soap.                9.  Pat yourself dry with a clean towel.            10.  Wear clean pajamas.            11.  Place clean sheets on your bed the night of your first shower and do not  sleep with pets. Day of Surgery : Do not apply any lotions/deodorants the morning of surgery.  Please wear clean clothes to the hospital/surgery center.  FAILURE TO FOLLOW THESE INSTRUCTIONS MAY RESULT IN THE CANCELLATION OF YOUR SURGERY PATIENT SIGNATURE_________________________________  NURSE SIGNATURE__________________________________  ________________________________________________________________________

## 2018-02-16 NOTE — Progress Notes (Signed)
Ekg, cxr, ECHO epic 2019  Medical clearance Dr Holley Raring on chart   LOV cards Kevin Hart 11-09-17 epic

## 2018-02-21 ENCOUNTER — Encounter (HOSPITAL_COMMUNITY): Payer: Self-pay

## 2018-02-21 ENCOUNTER — Encounter (HOSPITAL_COMMUNITY)
Admission: RE | Admit: 2018-02-21 | Discharge: 2018-02-21 | Disposition: A | Discharge status: Home / Self Care | Payer: PPO | Source: Ambulatory Visit | Attending: Urology | Admitting: Urology

## 2018-02-21 ENCOUNTER — Other Ambulatory Visit: Payer: Self-pay

## 2018-02-21 DIAGNOSIS — Z01812 Encounter for preprocedural laboratory examination: Secondary | ICD-10-CM | POA: Insufficient documentation

## 2018-02-21 HISTORY — DX: Acute myocardial infarction, unspecified: I21.9

## 2018-02-21 LAB — BASIC METABOLIC PANEL
Anion gap: 9 (ref 5–15)
BUN: 14 mg/dL (ref 8–23)
CO2: 27 mmol/L (ref 22–32)
Calcium: 9.6 mg/dL (ref 8.9–10.3)
Chloride: 105 mmol/L (ref 98–111)
Creatinine, Ser: 1.07 mg/dL (ref 0.61–1.24)
GFR calc Af Amer: >60 mL/min (ref >60)
Glucose, Bld: 108 mg/dL — ABNORMAL HIGH (ref 70–99)
POTASSIUM: 3.5 mmol/L (ref 3.5–5.1)
Sodium: 141 mmol/L (ref 135–145)

## 2018-02-21 LAB — CBC
HCT: 41.4 % (ref 39.0–52.0)
Hemoglobin: 13.5 g/dL (ref 13.0–17.0)
MCH: 29.1 pg (ref 26.0–34.0)
MCHC: 32.6 g/dL (ref 30.0–36.0)
MCV: 89.2 fL (ref 80.0–100.0)
Platelets: 186 10*3/uL (ref 150–400)
RBC: 4.64 MIL/uL (ref 4.22–5.81)
RDW: 16.2 % — ABNORMAL HIGH (ref 11.5–15.5)
WBC: 6.8 10*3/uL (ref 4.0–10.5)
nRBC: 0 % (ref 0.0–0.2)

## 2018-02-22 DIAGNOSIS — N401 Enlarged prostate with lower urinary tract symptoms: Secondary | ICD-10-CM | POA: Diagnosis not present

## 2018-02-22 DIAGNOSIS — R3914 Feeling of incomplete bladder emptying: Secondary | ICD-10-CM | POA: Diagnosis not present

## 2018-02-23 NOTE — Anesthesia Preprocedure Evaluation (Addendum)
Anesthesia Evaluation  Patient identified by MRN, date of birth, ID band Patient awake    Reviewed: Allergy & Precautions, NPO status , Patient's Chart, lab work & pertinent test results  Airway Mallampati: III  TM Distance: >3 FB Neck ROM: Full    Dental  (+)  Lower bridge:   Pulmonary sleep apnea (bipap) ,    Pulmonary exam normal breath sounds clear to auscultation       Cardiovascular hypertension, + angina + CAD, + Past MI (2016), + Cardiac Stents (2000. DES 2016), +CHF and + DVT  Normal cardiovascular exam+ Cardiac Defibrillator  Rhythm:Regular Rate:Normal  TTE 07/2017  The estimated ejection fraction was in the   range of 35% to 40%. Anterior, anteroseptal, apical and   inferoapical hypokinesis. Doppler parameters are consistent with abnormal left ventricular relaxation (grade 1 diastolic dysfunction).  - Aortic valve: Sclerosis without stenosis. There was mild   regurgitation. - Mitral valve: Mildly thickened leaflets . There was trivial   regurgitation. - Left atrium: The atrium was normal in size. - Right ventricle: The cavity size was mildly dilated. Pacer or AICD wire in right ventricle. Systolic function was normal. - Right atrium: The atrium was mildly dilated. Pacer or AICD wire noted in right atrium. - Tricuspid valve: There was trivial regurgitation.  Stress Test 2018 The left ventricular ejection fraction is moderately decreased (30-44%). There was no ST segment deviation noted during stress. There is a large defect of moderate severity present in the basal inferior, mid anterior, mid inferoseptal, mid inferior, apical anterior, apical septal, apical inferior, apical lateral and apex location. The defect is mostly fixed with a small area of reversibility in the inferoseptal region. This is consistent with a large area of scar and mild peri infarct ischemia in the inferoseptal wall. This is a high risk study  due to LV dysfunction and large area of scar with peri infarct ischemia in the inferoseptum. Nuclear stress EF: 34%.   Neuro/Psych negative neurological ROS  negative psych ROS   GI/Hepatic Neg liver ROS, GERD  Medicated and Controlled,  Endo/Other  negative endocrine ROS  Renal/GU negative Renal ROS  negative genitourinary   Musculoskeletal  (+) Arthritis ,   Abdominal   Peds  Hematology negative hematology ROS (+) Lupus anticoagulant   Anesthesia Other Findings On plavix and coumadin, last dose 02/16/18  Reproductive/Obstetrics                           Anesthesia Physical Anesthesia Plan  ASA: III  Anesthesia Plan: General   Post-op Pain Management:    Induction: Intravenous  PONV Risk Score and Plan: 2 and Treatment may vary due to age or medical condition, Ondansetron and Dexamethasone  Airway Management Planned: LMA  Additional Equipment:   Intra-op Plan:   Post-operative Plan: Extubation in OR  Informed Consent: I have reviewed the patients History and Physical, chart, labs and discussed the procedure including the risks, benefits and alternatives for the proposed anesthesia with the patient or authorized representative who has indicated his/her understanding and acceptance.   Dental advisory given  Plan Discussed with: CRNA  Anesthesia Plan Comments:         Anesthesia Quick Evaluation

## 2018-02-23 NOTE — H&P (Signed)
CC: I have symptoms of an enlarged prostate.  HPI: Kevin Hart is a 72 year-old male established patient who is here for symptoms of enlarged prostate.  02/22/18: He is scheduled for bladder stone removal and TURP with Dr Jeffie Pollock on the 5th of this month. He continues on 0.8mg  tamsulosin. He reports no changes in voiding habits, no recent dysuria or gross hematuria. He's not had recent fevers or abx treatment.   September 2019: [name] returns today in f/u from [facility] with the results noted below. Since his last visit he has passed a stone he thinks and on the CT he might have had a prostatic urethral stone in addition to his bladder stone. He has a hypotonic bladder but generated a good detrusor contraction but he had a reduced stream and a large PVR.    Large hyposensitive detrusor. [name] held a max capacity of approx. 1166 mls. His 1st sensation was felt at 540 mls. No instability was noted during the study. He was able to generate a strong voluntary contraction. He had to stand up to get his flow started. He voided with an obstructed flow pattern. Intermittent increase in EMG activity was noted during voiding. PVR was approx. 790 mls. Trabeculation and elevation of the bladder base were noted. No reflux was seen during filling or during his attempt to void while seated. There are no voiding images available since he stood to void. He was advised to call our office if he starts voiding less than he normally voids or has more difficulty than usual voiding. He will return next week for UDS f/u, sooner if needed.   His symptoms have gotten worse over the last year. He has been treated with Flomax.     ALLERGIES: No Allergies    MEDICATIONS: Crestor 20 mg tablet  Tamsulosin Hcl 0.4 mg capsule 2 capsule PO Daily  Allopurinol 300 mg tablet Oral  Coumadin 6 mg tablet Oral  Entresto 24 mg-26 mg tablet  Flomax 0.4 mg capsule Oral  Lasix 80 mg tablet Oral  Protonix 40 mg tablet, delayed release  Oral     GU PSH: Complex cystometrogram, w/ void pressure and urethral pressure profile studies, any technique - 12/06/2017 Complex Uroflow - 12/06/2017, 12/02/2017 Cystoscopy - 12/13/2017 Emg surf Electrd - 12/06/2017 Inject For cystogram - 12/06/2017 Intrabd voidng Press - 12/06/2017 Locm 300-399Mg /Ml Iodine,1Ml - 11/23/2017 Ureteroscopic stone removal - 2010, 2010      [place] Notes: Cystoscopy With Ureteroscopy With Removal Of Calculus, Cystoscopy With Ureteroscopy With Removal Of Calculus, Wrist Surgery   NON-GU PSH: None   GU PMH: Bladder Stone - 12/13/2017 BPH w/LUTS, He has BPH with BOO and a small bladder stone with a hyposensate large capacity bladder with a good bladder contraction but high PVR. I discussed the options for therapy and will get him set up for cystolithalopaxy [place] I will get him cleared by cardiology to come off of warfarin for the procedure. I reviewd the risks of a TURP including bleeding, infection, incontinence, stricture, need for secondary procedures, ejaculatory and erectile dysfunction, thrombotic events, fluid overload and anesthetic complications. I explained that 95% of men will have relief of the obstructive symptoms and about 70% will have relief of the irritative symptoms. - 12/13/2017, He will remain on tamsulosin. , - 12/02/2017 Incomplete bladder emptying, His PVR today was >745ml but he is comfortable and voiding without complaints and it appears to be very chronic based  on the prior imaging. I am going to have him do a voiding diary and return for urodynamics and consideration of cystoscopy to see if it is treatable as his bladder could potentially decompensate further and impact the kidneys. - 12/02/2017 Renal cyst (Improving), Right, The renal bleed was into a large right simple cyst without evidence of findings to suggest a malignancy. He will need f/u imaging in 3-6 months with an [place]. 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, Renal neoplasm - 2014      PMH Notes:  1898-03-23 00:00:00 - Note: Normal Routine History And Physical Adult  2008-03-07 14:18:14 - Note: Pulmonary Embolism   NON-GU PMH: Gout, Gout - 2014 Personal history of other diseases of the digestive system, History of esophageal reflux - 2014 Personal history of other endocrine, nutritional and metabolic disease, History of hypercholesterolemia - 2014 Arrhythmia Congestive heart failure Coronary Artery Disease DVT, History GERD Hypercholesterolemia Myocardial Infarction Sleep Apnea    FAMILY HISTORY: None   SOCIAL HISTORY: Marital Status: Widowed Preferred Language: English; Race: White Current Smoking Status: Patient has never smoked.   Tobacco Use Assessment Completed: Used Tobacco in last 30 days? Light Drinker.      Notes: Marital History - Widowed, Alcohol Use, Tobacco Use, Occupation:   REVIEW OF SYSTEMS:    GU Review Male:   Patient reports frequent urination, get up at night to urinate, trouble starting your stream, and erection problems. Patient denies hard to postpone urination, burning/ pain with urination, leakage of urine, stream starts and stops, have to strain to urinate , and penile pain.  Gastrointestinal (Upper):   Patient denies nausea, vomiting, and indigestion/ heartburn.  Gastrointestinal (Lower):   Patient denies diarrhea and constipation.  Constitutional:   Patient denies fever, night sweats, weight loss, and fatigue.  Skin:   Patient denies skin rash/ lesion and itching.  Eyes:   Patient denies blurred vision and double vision.  Ears/ Nose/ Throat:   Patient denies sore throat and sinus problems.  Hematologic/Lymphatic:   Patient denies swollen glands and easy bruising.  Cardiovascular:   Patient denies leg swelling and chest pains.  Respiratory:   Patient denies cough and shortness of breath.  Endocrine:   Patient  denies excessive thirst.  Musculoskeletal:   Patient denies back pain and joint pain.  Neurological:   Patient denies headaches and dizziness.  Psychologic:   Patient denies depression and anxiety.   VITAL SIGNS:      02/22/2018 02:24 PM  Weight 230 lb / 104.33 kg  Height 72 in / 182.88 cm  BP 108/74 mmHg  Pulse 100 /min  Temperature 98.1 F / 36.7 C  BMI 31.2 kg/m   MULTI-SYSTEM PHYSICAL EXAMINATION:    Constitutional: Well-nourished. No physical deformities. Normally developed. Good grooming.  Neck: Neck symmetrical, not swollen. Normal tracheal position.  Respiratory: Normal breath sounds. No labored breathing, no use of accessory muscles.   Cardiovascular: Regular rate and rhythm. No murmur, no gallop. Normal temperature, normal extremity pulses, no swelling, no varicosities. He has an ICD in the left upper chest.  Neurologic / Psychiatric: Oriented to time, oriented to place, oriented to person. No depression, no anxiety, no agitation.  Gastrointestinal: No mass, no tenderness, no rigidity, non obese abdomen.  Musculoskeletal: Normal gait and station of head and neck.     PAST DATA REVIEWED:  Source Of History:  Patient  Records Review:   Previous Doctor Records, Previous Patient Records  Urine Test Review:   Urinalysis   02/22/18  Urinalysis  Urine Appearance Clear   Urine Color Yellow   Urine Glucose Neg mg/dL  Urine Bilirubin Neg mg/dL  Urine Ketones Neg mg/dL  Urine Specific Gravity 1.020   Urine Blood Neg ery/uL  Urine pH <=5.0   Urine Protein Trace mg/dL  Urine Urobilinogen 0.2 mg/dL  Urine Nitrites Neg   Urine Leukocyte Esterase Neg leu/uL   PROCEDURES:          Urinalysis Dipstick Dipstick Cont'd  Color: Yellow Bilirubin: Neg mg/dL  Appearance: Clear Ketones: Neg mg/dL  Specific Gravity: 1.020 Blood: Neg ery/uL  pH: <=5.0 Protein: Trace mg/dL  Glucose: Neg mg/dL Urobilinogen: 0.2 mg/dL    Nitrites: Neg    Leukocyte Esterase: Neg leu/uL     ASSESSMENT:      ICD-10 Details  1 GU:   BPH w/LUTS - N40.1   2   Incomplete bladder emptying - R39.14    PLAN:           Orders Labs Urine Culture          Schedule Return Visit/Planned Activity: Keep Scheduled Appointment - Schedule Surgery, Follow up MD          Document Letter(s):  Created for Patient: Clinical Summary         Notes:   The patient has already stopped anticoagulation therapy as directed. He will leave a urine sample at the conclusion of today's visit. This will be sent for preprocedural baseline urine culture. I answered all questions to the best of my ability about upcoming procedure with understanding expressed by the patient. He will proceed with scheduled bladder stone removal and TURP on 12/5 Dr. Jeffie Pollock.        Next Appointment:      Next Appointment: 02/24/2018 07:30 AM    Appointment Type: Surgery     Location: [facility] [phone]    Provider: Irine Seal, M.D.    Reason for Visit: OBS WL CYSTO STONE REMOVAL REMOVAL TURP

## 2018-02-24 ENCOUNTER — Ambulatory Visit (HOSPITAL_COMMUNITY): Payer: PPO | Admitting: Anesthesiology

## 2018-02-24 ENCOUNTER — Other Ambulatory Visit: Payer: Self-pay

## 2018-02-24 ENCOUNTER — Encounter (HOSPITAL_COMMUNITY): Payer: Self-pay

## 2018-02-24 ENCOUNTER — Encounter (HOSPITAL_COMMUNITY)
Admission: RE | Disposition: A | Discharge status: Home / Self Care | Payer: Self-pay | Source: Home / Self Care | Attending: Urology

## 2018-02-24 ENCOUNTER — Inpatient Hospital Stay (HOSPITAL_COMMUNITY)
Admission: RE | Admit: 2018-02-24 | Discharge: 2018-02-26 | DRG: 713 | Disposition: A | Discharge status: Home / Self Care | Payer: PPO | Attending: Urology | Admitting: Urology

## 2018-02-24 DIAGNOSIS — N401 Enlarged prostate with lower urinary tract symptoms: Secondary | ICD-10-CM | POA: Diagnosis present

## 2018-02-24 DIAGNOSIS — Z95810 Presence of automatic (implantable) cardiac defibrillator: Secondary | ICD-10-CM

## 2018-02-24 DIAGNOSIS — I2510 Atherosclerotic heart disease of native coronary artery without angina pectoris: Secondary | ICD-10-CM | POA: Diagnosis present

## 2018-02-24 DIAGNOSIS — I255 Ischemic cardiomyopathy: Secondary | ICD-10-CM | POA: Diagnosis present

## 2018-02-24 DIAGNOSIS — Z79899 Other long term (current) drug therapy: Secondary | ICD-10-CM

## 2018-02-24 DIAGNOSIS — Z955 Presence of coronary angioplasty implant and graft: Secondary | ICD-10-CM | POA: Diagnosis not present

## 2018-02-24 DIAGNOSIS — N320 Bladder-neck obstruction: Secondary | ICD-10-CM | POA: Diagnosis present

## 2018-02-24 DIAGNOSIS — I5022 Chronic systolic (congestive) heart failure: Secondary | ICD-10-CM | POA: Diagnosis present

## 2018-02-24 DIAGNOSIS — G4730 Sleep apnea, unspecified: Secondary | ICD-10-CM | POA: Diagnosis present

## 2018-02-24 DIAGNOSIS — D6862 Lupus anticoagulant syndrome: Secondary | ICD-10-CM | POA: Diagnosis present

## 2018-02-24 DIAGNOSIS — Z7901 Long term (current) use of anticoagulants: Secondary | ICD-10-CM | POA: Diagnosis not present

## 2018-02-24 DIAGNOSIS — N210 Calculus in bladder: Secondary | ICD-10-CM | POA: Diagnosis present

## 2018-02-24 DIAGNOSIS — N400 Enlarged prostate without lower urinary tract symptoms: Secondary | ICD-10-CM | POA: Diagnosis not present

## 2018-02-24 DIAGNOSIS — N312 Flaccid neuropathic bladder, not elsewhere classified: Secondary | ICD-10-CM | POA: Diagnosis present

## 2018-02-24 DIAGNOSIS — N420 Calculus of prostate: Secondary | ICD-10-CM | POA: Diagnosis present

## 2018-02-24 DIAGNOSIS — K219 Gastro-esophageal reflux disease without esophagitis: Secondary | ICD-10-CM | POA: Diagnosis present

## 2018-02-24 DIAGNOSIS — I252 Old myocardial infarction: Secondary | ICD-10-CM

## 2018-02-24 DIAGNOSIS — E785 Hyperlipidemia, unspecified: Secondary | ICD-10-CM | POA: Diagnosis present

## 2018-02-24 DIAGNOSIS — Z86718 Personal history of other venous thrombosis and embolism: Secondary | ICD-10-CM | POA: Diagnosis not present

## 2018-02-24 DIAGNOSIS — I110 Hypertensive heart disease with heart failure: Secondary | ICD-10-CM | POA: Diagnosis present

## 2018-02-24 DIAGNOSIS — N138 Other obstructive and reflux uropathy: Secondary | ICD-10-CM | POA: Diagnosis present

## 2018-02-24 HISTORY — PX: TRANSURETHRAL RESECTION OF PROSTATE: SHX73

## 2018-02-24 LAB — PROTIME-INR
INR: 1.06
Prothrombin Time: 13.7 seconds (ref 11.4–15.2)

## 2018-02-24 SURGERY — TURP (TRANSURETHRAL RESECTION OF PROSTATE)
Anesthesia: General

## 2018-02-24 MED ORDER — ADULT MULTIVITAMIN W/MINERALS CH
1.0000 | ORAL_TABLET | Freq: Every day | ORAL | Status: DC
  Administered 2018-02-24 – 2018-02-26 (×3): 1 via ORAL
  Filled 2018-02-24 (×3): qty 1

## 2018-02-24 MED ORDER — ROSUVASTATIN CALCIUM 20 MG PO TABS
20.0000 mg | ORAL_TABLET | Freq: Every day | ORAL | Status: DC
  Administered 2018-02-25 – 2018-02-26 (×2): 20 mg via ORAL
  Filled 2018-02-24 (×2): qty 1

## 2018-02-24 MED ORDER — 0.9 % SODIUM CHLORIDE (POUR BTL) OPTIME
TOPICAL | Status: DC | PRN
  Administered 2018-02-24: 1000 mL

## 2018-02-24 MED ORDER — LIDOCAINE HCL (CARDIAC) PF 100 MG/5ML IV SOSY
PREFILLED_SYRINGE | INTRAVENOUS | Status: DC | PRN
  Administered 2018-02-24: 100 mg via INTRAVENOUS

## 2018-02-24 MED ORDER — SODIUM CHLORIDE 0.9 % IV SOLN
INTRAVENOUS | Status: DC | PRN
  Administered 2018-02-24: 50 ug/min via INTRAVENOUS

## 2018-02-24 MED ORDER — SACUBITRIL-VALSARTAN 24-26 MG PO TABS
1.0000 | ORAL_TABLET | Freq: Two times a day (BID) | ORAL | Status: DC
  Administered 2018-02-24 – 2018-02-26 (×4): 1 via ORAL
  Filled 2018-02-24 (×4): qty 1

## 2018-02-24 MED ORDER — PHENYLEPHRINE HCL 10 MG/ML IJ SOLN
INTRAMUSCULAR | Status: DC | PRN
  Administered 2018-02-24: 160 ug via INTRAVENOUS
  Administered 2018-02-24: 120 ug via INTRAVENOUS
  Administered 2018-02-24 (×3): 80 ug via INTRAVENOUS
  Administered 2018-02-24: 160 ug via INTRAVENOUS
  Administered 2018-02-24 (×2): 80 ug via INTRAVENOUS
  Administered 2018-02-24: 40 ug via INTRAVENOUS

## 2018-02-24 MED ORDER — PROPOFOL 10 MG/ML IV BOLUS
INTRAVENOUS | Status: AC
  Filled 2018-02-24: qty 40

## 2018-02-24 MED ORDER — FENTANYL CITRATE (PF) 100 MCG/2ML IJ SOLN
INTRAMUSCULAR | Status: AC
  Filled 2018-02-24: qty 2

## 2018-02-24 MED ORDER — FLEET ENEMA 7-19 GM/118ML RE ENEM: 1.0000 | ENEMA | Freq: Once | RECTAL | Status: DC | PRN

## 2018-02-24 MED ORDER — ACETAMINOPHEN 325 MG PO TABS: 650.0000 mg | ORAL_TABLET | ORAL | Status: DC | PRN

## 2018-02-24 MED ORDER — HYOSCYAMINE SULFATE 0.125 MG SL SUBL
0.1250 mg | SUBLINGUAL_TABLET | SUBLINGUAL | Status: DC | PRN
  Filled 2018-02-24: qty 1

## 2018-02-24 MED ORDER — NITROGLYCERIN 0.4 MG SL SUBL: 0.4000 mg | SUBLINGUAL_TABLET | SUBLINGUAL | Status: DC | PRN

## 2018-02-24 MED ORDER — BELLADONNA ALKALOIDS-OPIUM 16.2-30 MG RE SUPP
RECTAL | Status: AC
  Filled 2018-02-24: qty 1

## 2018-02-24 MED ORDER — FENTANYL CITRATE (PF) 100 MCG/2ML IJ SOLN
INTRAMUSCULAR | Status: DC | PRN
  Administered 2018-02-24: 50 ug via INTRAVENOUS
  Administered 2018-02-24: 12.5 ug via INTRAVENOUS
  Administered 2018-02-24 (×3): 25 ug via INTRAVENOUS
  Administered 2018-02-24: 12.5 ug via INTRAVENOUS

## 2018-02-24 MED ORDER — PANTOPRAZOLE SODIUM 40 MG PO TBEC
40.0000 mg | DELAYED_RELEASE_TABLET | Freq: Every day | ORAL | Status: DC
  Administered 2018-02-25 – 2018-02-26 (×2): 40 mg via ORAL
  Filled 2018-02-24 (×2): qty 1

## 2018-02-24 MED ORDER — FENTANYL CITRATE (PF) 100 MCG/2ML IJ SOLN
25.0000 ug | INTRAMUSCULAR | Status: AC | PRN
  Administered 2018-02-24: 50 ug via INTRAVENOUS
  Administered 2018-02-24: 25 ug via INTRAVENOUS
  Administered 2018-02-24: 50 ug via INTRAVENOUS
  Administered 2018-02-24 (×2): 25 ug via INTRAVENOUS
  Administered 2018-02-24: 50 ug via INTRAVENOUS

## 2018-02-24 MED ORDER — ONDANSETRON HCL 4 MG/2ML IJ SOLN
INTRAMUSCULAR | Status: AC
  Filled 2018-02-24: qty 2

## 2018-02-24 MED ORDER — CEFAZOLIN SODIUM-DEXTROSE 2-4 GM/100ML-% IV SOLN
2.0000 g | INTRAVENOUS | Status: AC
  Administered 2018-02-24: 2 g via INTRAVENOUS

## 2018-02-24 MED ORDER — ADULT MULTIVITAMIN W/MINERALS CH
1.0000 | ORAL_TABLET | Freq: Every day | ORAL | Status: DC
  Filled 2018-02-24: qty 1

## 2018-02-24 MED ORDER — PROPOFOL 10 MG/ML IV BOLUS
INTRAVENOUS | Status: DC | PRN
  Administered 2018-02-24 (×2): 30 mg via INTRAVENOUS
  Administered 2018-02-24: 60 mg via INTRAVENOUS
  Administered 2018-02-24: 20 mg via INTRAVENOUS
  Administered 2018-02-24: 30 mg via INTRAVENOUS
  Administered 2018-02-24: 20 mg via INTRAVENOUS
  Administered 2018-02-24: 40 mg via INTRAVENOUS
  Administered 2018-02-24: 100 mg via INTRAVENOUS
  Administered 2018-02-24: 20 mg via INTRAVENOUS

## 2018-02-24 MED ORDER — FENTANYL CITRATE (PF) 100 MCG/2ML IJ SOLN
INTRAMUSCULAR | Status: AC
  Filled 2018-02-24: qty 4

## 2018-02-24 MED ORDER — POTASSIUM CHLORIDE IN NACL 20-0.45 MEQ/L-% IV SOLN
INTRAVENOUS | Status: DC
  Administered 2018-02-24 – 2018-02-25 (×2): via INTRAVENOUS
  Filled 2018-02-24 (×3): qty 1000

## 2018-02-24 MED ORDER — PHENYLEPHRINE 40 MCG/ML (10ML) SYRINGE FOR IV PUSH (FOR BLOOD PRESSURE SUPPORT)
PREFILLED_SYRINGE | INTRAVENOUS | Status: AC
  Filled 2018-02-24: qty 10

## 2018-02-24 MED ORDER — HYDROMORPHONE HCL 1 MG/ML IJ SOLN
INTRAMUSCULAR | Status: AC
  Filled 2018-02-24: qty 1

## 2018-02-24 MED ORDER — PROPOFOL 10 MG/ML IV BOLUS
INTRAVENOUS | Status: AC
  Filled 2018-02-24: qty 20

## 2018-02-24 MED ORDER — SENNOSIDES-DOCUSATE SODIUM 8.6-50 MG PO TABS: 1.0000 | ORAL_TABLET | Freq: Every evening | ORAL | Status: DC | PRN

## 2018-02-24 MED ORDER — FUROSEMIDE 40 MG PO TABS
40.0000 mg | ORAL_TABLET | Freq: Every day | ORAL | Status: DC
  Administered 2018-02-24 – 2018-02-26 (×3): 40 mg via ORAL
  Filled 2018-02-24 (×3): qty 1

## 2018-02-24 MED ORDER — BISACODYL 10 MG RE SUPP: 10.0000 mg | Freq: Every day | RECTAL | Status: DC | PRN

## 2018-02-24 MED ORDER — SODIUM CHLORIDE 0.9 % IR SOLN
Status: DC | PRN
  Administered 2018-02-24: 21000 mL via INTRAVESICAL

## 2018-02-24 MED ORDER — LIDOCAINE 2% (20 MG/ML) 5 ML SYRINGE
INTRAMUSCULAR | Status: AC
  Filled 2018-02-24: qty 5

## 2018-02-24 MED ORDER — HYDROCODONE-ACETAMINOPHEN 5-325 MG PO TABS
1.0000 | ORAL_TABLET | ORAL | Status: DC | PRN
  Administered 2018-02-24 – 2018-02-26 (×2): 2 via ORAL
  Filled 2018-02-24 (×3): qty 2

## 2018-02-24 MED ORDER — LACTATED RINGERS IV SOLN
INTRAVENOUS | Status: DC
  Administered 2018-02-24: 1000 mL via INTRAVENOUS

## 2018-02-24 MED ORDER — CEFAZOLIN SODIUM-DEXTROSE 2-4 GM/100ML-% IV SOLN
INTRAVENOUS | Status: AC
  Filled 2018-02-24: qty 100

## 2018-02-24 MED ORDER — DEXAMETHASONE SODIUM PHOSPHATE 10 MG/ML IJ SOLN
INTRAMUSCULAR | Status: AC
  Filled 2018-02-24: qty 1

## 2018-02-24 MED ORDER — PROPOFOL 500 MG/50ML IV EMUL
INTRAVENOUS | Status: DC | PRN
  Administered 2018-02-24: 25 ug/kg/min via INTRAVENOUS

## 2018-02-24 MED ORDER — ONDANSETRON HCL 4 MG/2ML IJ SOLN: 4.0000 mg | INTRAMUSCULAR | Status: DC | PRN

## 2018-02-24 MED ORDER — LIDOCAINE HCL URETHRAL/MUCOSAL 2 % EX GEL
CUTANEOUS | Status: AC
  Filled 2018-02-24: qty 5

## 2018-02-24 MED ORDER — HYDROMORPHONE HCL 1 MG/ML IJ SOLN
0.5000 mg | INTRAMUSCULAR | Status: DC | PRN
  Administered 2018-02-24 (×2): 0.5 mg via INTRAVENOUS

## 2018-02-24 MED ORDER — CEPHALEXIN 500 MG PO CAPS
500.0000 mg | ORAL_CAPSULE | Freq: Three times a day (TID) | ORAL | Status: DC
  Administered 2018-02-24 – 2018-02-26 (×6): 500 mg via ORAL
  Filled 2018-02-24 (×6): qty 1

## 2018-02-24 MED ORDER — SODIUM CHLORIDE 0.9 % IR SOLN
3000.0000 mL | Status: DC
  Administered 2018-02-24: 3000 mL

## 2018-02-24 MED ORDER — PHENYLEPHRINE HCL 10 MG/ML IJ SOLN
INTRAMUSCULAR | Status: AC
  Filled 2018-02-24: qty 2

## 2018-02-24 SURGICAL SUPPLY — 21 items
BAG URINE DRAINAGE (UROLOGICAL SUPPLIES) ×2 IMPLANT
BAG URO CATCHER STRL LF (MISCELLANEOUS) ×2 IMPLANT
CATH FOLEY 3WAY 30CC 22FR (CATHETERS) ×2 IMPLANT
CATH URET 5FR 28IN OPEN ENDED (CATHETERS) IMPLANT
CLOTH BEACON ORANGE TIMEOUT ST (SAFETY) IMPLANT
COVER WAND RF STERILE (DRAPES) IMPLANT
ELECT REM PT RETURN 15FT ADLT (MISCELLANEOUS) IMPLANT
FIBER LASER FLEXIVA 1000 (UROLOGICAL SUPPLIES) IMPLANT
FIBER LASER FLEXIVA 550 (UROLOGICAL SUPPLIES) IMPLANT
GLOVE SURG SS PI 8.0 STRL IVOR (GLOVE) ×2 IMPLANT
GOWN STRL REUS W/TWL XL LVL3 (GOWN DISPOSABLE) ×2 IMPLANT
HOLDER FOLEY CATH W/STRAP (MISCELLANEOUS) ×2 IMPLANT
LOOP CUT BIPOLAR 24F LRG (ELECTROSURGICAL) ×2 IMPLANT
MANIFOLD NEPTUNE II (INSTRUMENTS) ×2 IMPLANT
PACK CYSTO (CUSTOM PROCEDURE TRAY) ×2 IMPLANT
SET ASPIRATION TUBING (TUBING) IMPLANT
SYR 30ML LL (SYRINGE) ×2 IMPLANT
SYRINGE IRR TOOMEY STRL 70CC (SYRINGE) ×2 IMPLANT
TUBING CONNECTING 10 (TUBING) ×2 IMPLANT
TUBING UROLOGY SET (TUBING) ×2 IMPLANT
WATER STERILE IRR 500ML POUR (IV SOLUTION) ×2 IMPLANT

## 2018-02-24 NOTE — Op Note (Signed)
Procedure: Transurethral resection of the prostate.  Preop diagnosis: BPH with bladder outlet obstruction and small bladder stone.  Postop diagnosis: BPH with bladder outlet obstruction.  Surgeon: Dr. Irine Seal.  Anesthesia: General.  Specimen: Prostate chips.  Drains: 22 French three-way Foley catheter.  EBL: Approximately 200 mL.  Complications: None.  Indications: Kevin Hart is a 72 year old white male with BPH and bladder outlet obstruction who was found to have a large capacity bladder with some delayed sensation and an elevated PVR as well as a small bladder stone and after reviewing the options it was felt that TURP with remove the stone was indicated.  Procedure: He was taken to the operating room where he was given 2 g of Ancef.  A general anesthetic was induced.  He was placed in lithotomy position was fitted with PAS hose.  His perineum and genitalia were prepped with Betadine solution and he was draped in usual sterile fashion.  Cystoscopy was performed using a 23 [place] scope and 30 degree lens.  Examination revealed a normal urethra.  The external sphincter was intact.  The prostatic urethra was 4 to 5 cm in length with trilobar hyperplasia and a moderate middle lobe.  Examination of bladder revealed moderate severe trabeculation with a diverticulum at the left base.  No mucosal lesions were identified.  The previously noted bladder stone was not apparent.  The ureteral orifice ease were unremarkable.  The cystoscope was removed and a 26 French continuous-flow resectoscope sheath was placed with the aid of the visual obturator.  The sheath was fitted with an Beatrix Fetters handle with a bipolar loop and 30 degree lens.  Saline was used as the irrigant.  Resection of the prostate was initiated at the bladder neck with resection of the middle lobe down to the capsular fibers.  The floor the prostate was then resected out to alongside the verumontanum.  The left lobe of the prostate was  resected from bladder neck to apex and out to the capsular fibers.  The right lobe of the prostate was resected from bladder neck to apex and out to the capsular fibers.  The bladder was evacuated free of the initial chips.  Additional resection from the anterior and apical prostate was performed as well as additional resection from the floor as needed.  During the resection particularly of the floor in the right prostate I unroofed a large area of inspissated secretions.  Additional prostatic calculi were unroofed primarily at the apex bilaterally.  The bladder was then evacuated free of the residual chips and stones.  Final hemostasis was achieved.  Inspection demonstrated intact ureteral orifice ease, no retained chips or stones and an intact urethral sphincter.  The scope was removed and pressure on the bladder produced a good flow.  A 22 French three-way Foley catheter was inserted with the aid of a catheter guide and the balloon was filled with 30 mL of sterile fluid.  The catheter was irrigated with clear return and placed to continuous irrigation and straight drainage.  He was then taken down from lithotomy position, his anesthetic was reversed and he was moved to recovery in stable condition.  There were no complications.

## 2018-02-24 NOTE — Interval H&P Note (Signed)
History and Physical Interval Note:  02/24/2018 7:10 AM  Kevin Hart  has presented today for surgery, with the diagnosis of [place], BLADDER STONE  The various methods of treatment have been discussed with the patient and family. After consideration of risks, benefits and other options for treatment, the patient has consented to  Procedure(s): CYSTOSCOPY WITH LITHOLAPAXY ([name]) TRANSURETHRAL RESECTION OF THE PROSTATE (TURP) ([name]) as a surgical intervention .  The patient's history has been reviewed, patient examined, no change in status, stable for surgery.  I have reviewed the patient's chart and labs.  Questions were answered to the patient's satisfaction.     Irine Seal

## 2018-02-24 NOTE — Discharge Instructions (Addendum)
Transurethral Resection of the Prostate, Care After Refer to this sheet in the next few weeks. These instructions provide you with information about caring for yourself after your procedure. Your health care provider may also give you more specific instructions. Your treatment has been planned according to current medical practices, but problems sometimes occur. Call your health care provider if you have any problems or questions after your procedure. What can I expect after the procedure? After the procedure, it is common to have:  Mild pain in your lower abdomen.  Soreness or mild discomfort in your penis from having the catheter inserted during the procedure.  A feeling of urgency when you need to urinate.  A small amount of blood in your urine. You may notice some small blood clots in your urine. These are normal.  Follow these instructions at home: Medicines   Take over-the-counter and prescription medicines only as told by your health care provider.  Do not drive or operate heavy machinery while taking prescription pain medicine.  Do not drive for 24 hours if you received a sedative.  If you were prescribed antibiotic medicine, take it as told by your health care provider. Do not stop taking the antibiotic even if you start to feel better. Activity  Return to your normal activities as told by your health care provider. Ask your health care provider what activities are safe for you.  Do not lift anything that is heavier than 10 lb (4.5 kg) for 3 weeks after your procedure, or as long as told by your health care provider.  Avoid intense physical activity for as long as told by your health care provider.  Walk at least one time every day. This helps to prevent blood clots. You may increase your physical activity gradually as you start to feel better. Lifestyle  Do not drink alcohol for as long as told by your health care provider. This is especially important if you are taking  prescription pain medicines.  Do not engage in sexual activity until your health care provider says that you can do this. General instructions  Do not take baths, swim, or use a hot tub until your health care provider approves.  Drink enough fluid to keep your urine clear or pale yellow.  Urinate as soon as you feel the need to. Do not try to hold your urine for long periods of time.  If your health care provider approves, you may take a stool softener for 2-3 weeks to prevent you from straining to have a bowel movement.  Wear compression stockings as told by your health care provider. These stockings help to prevent blood clots and reduce swelling in your legs.  Keep all follow-up visits as told by your health care provider. This is important. Contact a health care provider if:  You have difficulty urinating.  You have a fever.  You have pain that gets worse or does not improve with medicine.  You have blood in your urine that does not go away after 1 week of resting and drinking more fluids.  You have swelling in your penis or testicles. Get help right away if:  You are unable to urinate.  You are having more blood clots in your urine instead of fewer.  You have: ? Large blood clots. ? A lot of blood in your urine. ? Pain in your back or lower abdomen. ? Pain or swelling in your legs. ? Chills and you are shaking.  Continue to hold warfarin and  plavix per preop instructions for the next week. Discontinue Tamsulosin.  This information is not intended to replace advice given to you by your health care provider. Make sure you discuss any questions you have with your health care provider. Document Released: 03/09/2005 Document Revised: 11/10/2015 Document Reviewed: 11/29/2014 Elsevier Interactive Patient Education  2017 [place] Coumadin tomorrow.  Restart plavix in 2 days if urine remains clear.

## 2018-02-24 NOTE — Transfer of Care (Signed)
Immediate Anesthesia Transfer of Care Note  Patient: Kevin Hart  Procedure(s) Performed: TRANSURETHRAL RESECTION OF THE PROSTATE (TURP) ([name] )  Patient Location: PACU  Anesthesia Type:General  Level of Consciousness: awake, alert , oriented and patient cooperative  Airway & Oxygen Therapy: Patient Spontanous Breathing and Patient connected to face mask oxygen  Post-op Assessment: Report given to RN, Post -op Vital signs reviewed and stable and Patient moving all extremities  Post vital signs: Reviewed and stable  Last Vitals:  Vitals Value Taken Time  BP 95/56 02/24/2018  9:00 AM  Temp    Pulse 82 02/24/2018  9:01 AM  Resp 15 02/24/2018  9:01 AM  SpO2 100 % 02/24/2018  9:01 AM  Vitals shown include unvalidated device data.  Last Pain:  Vitals:   02/24/18 0608  TempSrc:   PainSc: 0-No pain      Patients Stated Pain Goal: 4 (90/24/09 7353)  Complications: No apparent anesthesia complications

## 2018-02-24 NOTE — Anesthesia Procedure Notes (Signed)
Procedure Name: LMA Insertion Date/Time: 02/24/2018 7:32 AM Performed by: Deliah Boston, CRNA Pre-anesthesia Checklist: Patient identified, Emergency Drugs available, Suction available and Patient being monitored Patient Re-evaluated:Patient Re-evaluated prior to induction Oxygen Delivery Method: Circle system utilized Preoxygenation: Pre-oxygenation with 100% oxygen Induction Type: IV induction Ventilation: Mask ventilation without difficulty LMA: LMA inserted LMA Size: 5.0 Number of attempts: 1 Placement Confirmation: positive ETCO2 and breath sounds checked- equal and bilateral Tube secured with: Tape Dental Injury: Teeth and Oropharynx as per pre-operative assessment

## 2018-02-24 NOTE — Anesthesia Postprocedure Evaluation (Signed)
Anesthesia Post Note  Patient: Kevin Hart  Procedure(s) Performed: TRANSURETHRAL RESECTION OF THE PROSTATE (TURP) ([name] )     Patient location during evaluation: PACU Anesthesia Type: General Level of consciousness: awake and alert Pain management: pain level controlled Vital Signs Assessment: post-procedure vital signs reviewed and stable Respiratory status: spontaneous breathing, nonlabored ventilation, respiratory function stable and patient connected to nasal cannula oxygen Cardiovascular status: blood pressure returned to baseline and stable Postop Assessment: no apparent nausea or vomiting Anesthetic complications: no    Last Vitals:  Vitals:   02/24/18 1130 02/24/18 1349  BP: 106/66 122/66  Pulse: 87 92  Resp: 14 14  Temp:  36.7 C  SpO2: 99% 98%    Last Pain:  Vitals:   02/24/18 1349  TempSrc:   PainSc: 0-No pain                 Pranav Lince L Petra Sargeant

## 2018-02-25 ENCOUNTER — Encounter (HOSPITAL_COMMUNITY): Payer: Self-pay | Admitting: Urology

## 2018-02-25 DIAGNOSIS — G4730 Sleep apnea, unspecified: Secondary | ICD-10-CM | POA: Diagnosis present

## 2018-02-25 DIAGNOSIS — I110 Hypertensive heart disease with heart failure: Secondary | ICD-10-CM | POA: Diagnosis present

## 2018-02-25 DIAGNOSIS — N210 Calculus in bladder: Secondary | ICD-10-CM | POA: Diagnosis present

## 2018-02-25 DIAGNOSIS — Z79899 Other long term (current) drug therapy: Secondary | ICD-10-CM | POA: Diagnosis not present

## 2018-02-25 DIAGNOSIS — Z95810 Presence of automatic (implantable) cardiac defibrillator: Secondary | ICD-10-CM | POA: Diagnosis not present

## 2018-02-25 DIAGNOSIS — K219 Gastro-esophageal reflux disease without esophagitis: Secondary | ICD-10-CM | POA: Diagnosis present

## 2018-02-25 DIAGNOSIS — I5022 Chronic systolic (congestive) heart failure: Secondary | ICD-10-CM | POA: Diagnosis present

## 2018-02-25 DIAGNOSIS — I255 Ischemic cardiomyopathy: Secondary | ICD-10-CM | POA: Diagnosis present

## 2018-02-25 DIAGNOSIS — E785 Hyperlipidemia, unspecified: Secondary | ICD-10-CM | POA: Diagnosis present

## 2018-02-25 DIAGNOSIS — N320 Bladder-neck obstruction: Secondary | ICD-10-CM | POA: Diagnosis present

## 2018-02-25 DIAGNOSIS — Z7901 Long term (current) use of anticoagulants: Secondary | ICD-10-CM | POA: Diagnosis not present

## 2018-02-25 DIAGNOSIS — Z86718 Personal history of other venous thrombosis and embolism: Secondary | ICD-10-CM | POA: Diagnosis not present

## 2018-02-25 DIAGNOSIS — N420 Calculus of prostate: Secondary | ICD-10-CM | POA: Diagnosis present

## 2018-02-25 DIAGNOSIS — D6862 Lupus anticoagulant syndrome: Secondary | ICD-10-CM | POA: Diagnosis present

## 2018-02-25 DIAGNOSIS — I252 Old myocardial infarction: Secondary | ICD-10-CM | POA: Diagnosis not present

## 2018-02-25 DIAGNOSIS — Z955 Presence of coronary angioplasty implant and graft: Secondary | ICD-10-CM | POA: Diagnosis not present

## 2018-02-25 DIAGNOSIS — N312 Flaccid neuropathic bladder, not elsewhere classified: Secondary | ICD-10-CM | POA: Diagnosis present

## 2018-02-25 DIAGNOSIS — N401 Enlarged prostate with lower urinary tract symptoms: Secondary | ICD-10-CM | POA: Diagnosis present

## 2018-02-25 DIAGNOSIS — I2510 Atherosclerotic heart disease of native coronary artery without angina pectoris: Secondary | ICD-10-CM | POA: Diagnosis present

## 2018-02-25 DIAGNOSIS — N138 Other obstructive and reflux uropathy: Secondary | ICD-10-CM | POA: Diagnosis present

## 2018-02-25 NOTE — Care Management Obs Status (Signed)
[place] NOTIFICATION   Patient Details  Name: KELDRICK POMPLUN MRN: [id number] Date of Birth: 11-29-1945   Medicare Observation Status Notification Given:  Yes    MahabirJuliann Pulse, RN 02/25/2018, 10:26 AM

## 2018-02-25 NOTE — Progress Notes (Signed)
1 Day Post-Op Subjective: Patient w/o complaints.  Objective: Vital signs in last 24 hours: Temp:  [97.7 F (36.5 C)-98.8 F (37.1 C)] 97.7 F (36.5 C) (12/06 0554) Pulse Rate:  [57-96] 57 (12/06 0554) Resp:  [11-18] 16 (12/06 0554) BP: (95-140)/(56-85) 106/78 (12/06 0554) SpO2:  [94 %-100 %] 94 % (12/06 0554)  Intake/Output from previous day: 12/05 0701 - 12/06 0700 In: 3750 [I.V.:650; IV Piggyback:100] Out: 4925 [Urine:4925] Intake/Output this shift: No intake/output data recorded.  Physical Exam:  Constitutional: Vital signs reviewed. WD WN in NAD   Eyes: PERRL, No scleral icterus.   Cardiovascular: RRR Pulmonary/Chest: Normal effort  Irrigant clear pink Lab Results: No results for input(s): HGB, HCT in the last 72 hours. BMET No results for input(s): NA, K, CL, CO2, GLUCOSE, BUN, CREATININE, CALCIUM in the last 72 hours. Recent Labs    02/24/18 0623  INR 1.06   No results for input(s): LABURIN in the last 72 hours. Results for orders placed or performed during the hospital encounter of 10/25/17  MRSA PCR Screening     Status: None   Collection Time: 10/26/17  1:24 PM  Result Value Ref Range Status   MRSA by PCR NEGATIVE NEGATIVE Final    Comment:        The GeneXpert MRSA Assay (FDA approved for NASAL specimens only), is one component of a comprehensive MRSA colonization surveillance program. It is not intended to diagnose MRSA infection nor to guide or monitor treatment for MRSA infections. Performed at [facility], [place] [address] [place], [place] [address]     Studies/Results: No results found.  Assessment/Plan:   POD 1 TURP. Doing well  Taper CBI, saline lock IV   LOS: 0 days   Jorja Loa 02/25/2018, 7:50 AM

## 2018-02-26 NOTE — Progress Notes (Signed)
Pt. placed CPAP on prior to my arrival, tolerating well, remains on room air with own FF mask/tubing, made aware to notify if needed.

## 2018-02-26 NOTE — Discharge Summary (Signed)
Date of admission: 02/24/2018  Date of discharge: 02/26/2018  Admission diagnosis: bladder outlet obstruction - urinary retention  Discharge diagnosis: same  Secondary diagnoses:  Patient Active Problem List   Diagnosis Date Noted  . BPH with urinary obstruction 02/24/2018  . Renal hemorrhage, right 10/26/2017  . S/P left rotator cuff repair 03/05/2016  . Anterior dislocation of left shoulder 11/29/2015  . History of implantable cardioverter-defibrillator (ICD) placement 03/16/2015  . AICD (automatic cardioverter/defibrillator) present   . Chronic systolic CHF (congestive heart failure) ([place]) 12/31/2014  . Chronic systolic dysfunction of left ventricle 11/27/2014  . S/P drug eluting coronary stent placement, mLAD 07/06/14, Promus 07/07/2014  . At risk for sudden cardiac death, EF 25-30% with PVCs, with lifevest 07/07/2014  . Coronary artery disease involving native coronary artery of native heart with unstable angina pectoris ([place])   . Cardiomyopathy, ischemic   . History of DVT (deep vein thrombosis), on coumadin   . NSTEMI (non-ST elevated myocardial infarction) ([place]) 07/05/2014  . CAD (coronary artery disease)   . DVT (deep venous thrombosis), hx on coumadin   . Dyslipidemia   . GERD (gastroesophageal reflux disease)   . Anterior myocardial infarction ([place])   . Lupus anticoagulant positive     Procedures performed: Procedure(s): TRANSURETHRAL RESECTION OF THE PROSTATE (TURP)  History and Physical: For full details, please see admission history and physical. Briefly, Kevin Hart is a 72 y.o. year old patient with urinary retention.   Hospital Course: Patient tolerated the procedure well.  He was then transferred to the floor after an uneventful PACU stay.  His hospital course was uncomplicated.  On POD#2 he had met discharge criteria: was eating a regular diet, was up and ambulating independently,  pain was well controlled, was voiding without a catheter, and was ready to  for discharge.   Laboratory values:  No results for input(s): WBC, HGB, HCT in the last 72 hours. No results for input(s): NA, K, CL, CO2, GLUCOSE, BUN, CREATININE, CALCIUM in the last 72 hours. Recent Labs    02/24/18 0623  INR 1.06   No results for input(s): LABURIN in the last 72 hours. Results for orders placed or performed during the hospital encounter of 10/25/17  MRSA PCR Screening     Status: None   Collection Time: 10/26/17  1:24 PM  Result Value Ref Range Status   MRSA by PCR NEGATIVE NEGATIVE Final    Comment:        The GeneXpert MRSA Assay (FDA approved for NASAL specimens only), is one component of a comprehensive MRSA colonization surveillance program. It is not intended to diagnose MRSA infection nor to guide or monitor treatment for MRSA infections. Performed at [facility], [place] [address] [place], [place] [address]     Disposition: Home  Discharge instruction: The patient was instructed to be ambulatory but told to refrain from heavy lifting, strenuous activity, or driving.   Discharge medications:  Allergies as of 02/26/2018   No Active Allergies     Medication List    TAKE these medications   allopurinol 300 MG tablet Commonly known as:  ZYLOPRIM Take 300 mg by mouth daily.   clopidogrel 75 MG tablet Commonly known as:  PLAVIX Take 75 mg by mouth daily.   furosemide 40 MG tablet Commonly known as:  LASIX Take 40 mg by mouth daily.   MULTI-VITAMINS Tabs Take 1 tablet by mouth daily.   nitroGLYCERIN 0.4 MG SL tablet Commonly known as:  NITROSTAT Place  1 tablet (0.4 mg total) under the tongue every 5 (five) minutes x 3 doses as needed for chest pain.   pantoprazole 40 MG tablet Commonly known as:  PROTONIX Take 1 tablet (40 mg total) by mouth daily.   rosuvastatin 20 MG tablet Commonly known as:  CRESTOR Take 1 tablet (20 mg total) by mouth daily.   sacubitril-valsartan 24-26 MG Commonly known as:   ENTRESTO TAKE ONE TABLET BY MOUTH TWICE DAILY -TO REPLACE LISINOPRIL (START 36HOURS AFTER LAST DOSE) What changed:    how much to take  how to take this  when to take this   warfarin 5 MG tablet Commonly known as:  COUMADIN Take 0.5-1 tablets (2.5-5 mg total) by mouth See admin instructions. Take 5 mg by mouth on Tuesday, Wednesday, Thursday, Saturday and Sunday then take 2.5 mg by mouth on Monday and Friday What changed:    how much to take  additional instructions       Followup:  Follow-up Information    Karen Kays, NP On 03/10/2018.   Specialty:  Nurse Practitioner Why:  1:15 Contact information: [address] [place] [address] [phone]

## 2018-03-04 NOTE — Progress Notes (Signed)
Cardiology Office Note    Date:  03/08/2018   ID:  Kevin Hart, Kevin Hart [birth year], MRN [id number]  PCP:  Guadlupe Spanish, MD  Cardiologist:  Dr. Yaritzi Craun [place]; Shelva Majestic, MD(sleep) Primary electrophysiologist: Dr. Rayann Heman  Chief Complaint  Patient presents with  . Congestive Heart Failure  . Coronary Artery Disease    History of Present Illness:  Kevin Hart is a 72 y.o. 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 Claiborne Billings. He was admitted in April 2016 and underwent DES to his LAD for in-stent restenosis. Cardiac catheterization at that time showed 90% mid LAD stenosis within old stent, EF 25-30%. Titration of medications was limited by orthostasis. Repeat echocardiogram in May 2017 showed EF 30-35%. He underwent ICD implantation by Dr. Rayann Heman 11/27/2014. He also has OSA.   When seen on 10/08/2016, he was having recurrent chest discomfort. Myoview obtained on 10/14/2016 showed EF 34%, no EKG changes, large defect of moderate severity present in the basal inferior, mid anterior, mid inferoseptal, mid inferior, apical anterior, apical septal and apical inferior location. Most consistent with previous scar however has very mild peri-infarct ischemia. Last ICD check on May 03, 2017 was normal. In April he was started on Burt. Titration of CHF medications has been limited by low BP. Repeat echocardiogram obtained on 08/18/2017 showed EF 35 to 40%, mild LVH, wall motion abnormality, mild AI, trivial MR.  He was admitted in August 2019 with right-sided abdominal pain.  CT of abdomen and pelvis showed a large heterogeneous lesion arising at the interpolar region of the right kidney.  He was diagnosed with right hemorrhagic renal cyst.  Urology was consulted during the admission and recommended restarting Coumadin  after 1 week. Plavix was discontinued. He was seen by Almyra Deforest PA-C in August. Lipitor was switched to Crestor due to thigh pain. Given low BP eplerenone was discontinued.  Pharmacy considering PCSK 9 inhibitor.   He was admitted 02/23/18 with bladder outlet obstruction due to BPH. TURP performed.   On follow up today he reports he is doing well.  He denies any chest pain or SOB. His   chronic swelling in the left ankle (from prior DVT) has completely resolved. No orthopnea or PND. He has lost 40 lbs since this summer.   No dizziness or syncope.     Past Medical History:  Diagnosis Date  . Arthritis   . Bladder stone 10/26/2017  . BPH (benign prostatic hyperplasia)    takes Flomax daily  . CAD (coronary artery disease)    a. prior LAD stenting;  b. 06/2014 NSTEMI/PCI: LM nl, LAD 30p, 30m ISR (3.0x20 Promus DES), LCX nl, RCA nondom, nl, EF 25-30% ant, apical, dist inf AK.  . Cataracts, bilateral    immature  . Chronic systolic CHF (congestive heart failure) ([place]) 12/31/2014  . DVT (deep venous thrombosis) (HCC)    RECURRENT left leg  . Dyslipidemia   . GERD (gastroesophageal reflux disease)   . Gout   . History of kidney stones   . Hypertension   . Ischemic cardiomyopathy 07/06/2014  . Lupus anticoagulant positive   . Myocardial infarct ([place]) 2000  . Myocardial infarct ([place]) 2016  . S/P drug eluting coronary stent placement, 07/06/14, Promus 07/07/2014  . Sleep apnea    biPaP  . Subarachnoid bleed ([place]) 2015   after  falling and hitting his head    Past Surgical History:  Procedure Laterality Date  . COLONOSCOPY    . EP IMPLANTABLE DEVICE [name] 11/27/2014   St. Jude Medical Fortify Assura VR  implanted by Dr Rayann Heman for primary prevention  . LEFT HEART CATHETERIZATION WITH CORONARY ANGIOGRAM [name] 07/06/2014   Procedure: LEFT HEART CATHETERIZATION WITH CORONARY ANGIOGRAM;  Surgeon: Graci Hulce M Martin[place], MD;  Location[facility] C[facility] LAB;  Service: Cardiovascular;  Laterality: [name];  . PERCUTANEOUS  CORONARY STENT INTERVENTION (PCI-S) Right 07/06/2014   Procedure: PERCUTANEOUS CORONARY STENT INTERVENTION (PCI-S);  Surgeon: Jahzir Strohmeier M [place], MD;  Location: [facility] CATH LAB;  Service: Cardiovascular;  Laterality: Right;  . SHOULDER ARTHROSCOPY WITH SUBACROMIAL DECOMPRESSION Left 03/05/2016   Procedure: SHOULDER ARTHROSCOPY ROTATOR CUFF REPAIR WITH SUBACROMIAL DECOMPRESSION;  Surgeon: Tania Ade, MD;  Location: [place];  Service: Orthopedics;  Laterality: Left;  SHOULDER ARTHROSCOPY ROTATOR CUFF REPAIR WITH SUBACROMIAL DECOMPRESSION  . SHOULDER CLOSED REDUCTION Left 11/29/2015   Procedure: CLOSED REDUCTION SHOULDER;  Surgeon: Tania Ade, MD;  Location: [place];  Service: Orthopedics;  Laterality: Left;  . TRANSURETHRAL RESECTION OF PROSTATE [name] 02/24/2018   Procedure: TRANSURETHRAL RESECTION OF THE PROSTATE (TURP);  Surgeon: Irine Seal, MD;  Location: [facility];  Service: Urology;  Laterality: [name];  . WRIST SURGERY Bilateral    plates and screws     Current Medications: Outpatient Medications Prior to Visit  Medication Sig Dispense Refill  . allopurinol (ZYLOPRIM) 300 MG tablet Take 300 mg by mouth daily.     . furosemide (LASIX) 40 MG tablet Take 40 mg by mouth daily.     . Multiple Vitamin (MULTI-VITAMINS) TABS Take 1 tablet by mouth daily.     . nitroGLYCERIN (NITROSTAT) 0.4 MG SL tablet Place 1 tablet (0.4 mg total) under the tongue every 5 (five) minutes x 3 doses as needed for chest pain. 25 tablet 11  . pantoprazole (PROTONIX) 40 MG tablet Take 1 tablet (40 mg total) by mouth daily. 30 tablet 2  . sacubitril-valsartan (ENTRESTO) 24-26 MG TAKE ONE TABLET BY MOUTH TWICE DAILY -TO REPLACE LISINOPRIL (START 36HOURS AFTER LAST DOSE) (Patient taking differently: Take 1 tablet by mouth 2 (two) times daily. TAKE ONE TABLET BY MOUTH TWICE DAILY -TO REPLACE LISINOPRIL (START 36HOURS AFTER LAST DOSE)) 60 tablet 5  . warfarin (COUMADIN) 5 MG tablet Take 0.5-1 tablets (2.5-5 mg total) by mouth See admin  instructions. Take 5 mg by mouth on Tuesday, Wednesday, Thursday, Saturday and Sunday then take 2.5 mg by mouth on Monday and Friday (Patient taking differently: Take 5 mg by mouth See admin instructions. Take 5 mg by mouth on Tuesday, Thursday, Saturday and Sunday)    . warfarin (COUMADIN) 5 MG tablet Take 5 mg Sun-Tue-Thurs-Sat 90 tablet 3  . clopidogrel (PLAVIX) 75 MG tablet Take 75 mg by mouth daily.    . rosuvastatin (CRESTOR) 20 MG tablet Take 1 tablet (20 mg total) by mouth daily. 90 tablet 3   No facility-administered medications prior to visit.      Allergies:   Patient has no active allergies.   Social History   Socioeconomic History  . Marital status: Married    Spouse name: Not on file  . Number of children: 6  . Years of education: Not on file  . Highest education level: Not on file  Occupational History  . Not on file  Social Needs  . Financial resource strain: Not on file  . Food insecurity:    Worry: Not on  file    Inability: Not on file  . Transportation needs:    Medical: Not on file    Non-medical: Not on file  Tobacco Use  . Smoking status: Never Smoker  . Smokeless tobacco: Never Used  Substance and Sexual Activity  . Alcohol use: Yes    Alcohol/week: 0.0 standard drinks    Comment: seldom   . Drug use: No  . Sexual activity: Yes    Birth control/protection: None  Lifestyle  . Physical activity:    Days per week: Not on file    Minutes per session: Not on file  . Stress: Not on file  Relationships  . Social connections:    Talks on phone: Not on file    Gets together: Not on file    Attends religious service: Not on file    Active member of club or organization: Not on file    Attends meetings of clubs or organizations: Not on file    Relationship status: Not on file  Other Topics Concern  . Not on file  Social History Narrative   Pt lives in [place] with spouse.  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     Family History:  The  patient's family history includes Aneurysm in his father.   ROS:   Please see the history of present illness.    ROS All other systems reviewed and are negative.   PHYSICAL EXAM:   VS:  BP 99/72   Pulse 95   Ht 6' (1.829 m)   Wt 224 lb (101.6 kg)   BMI 30.38 kg/m    GENERAL:  Well appearing WM in NAD HEENT:  PERRL, EOMI, sclera are clear. Oropharynx is clear. NECK:  No jugular venous distention, carotid upstroke brisk and symmetric, no bruits, no thyromegaly or adenopathy LUNGS:  Clear to auscultation bilaterally CHEST:  Unremarkable HEART:  RRR,  PMI not displaced or sustained,S1 and S2 within normal limits, no S3, no S4: no clicks, no rubs, no murmurs ABD:  Soft, nontender. BS +, no masses or bruits. No hepatomegaly, no splenomegaly EXT:  2 + pulses throughout, no edema, no cyanosis no clubbing SKIN:  Warm and dry.  No rashes NEURO:  Alert and oriented x 3. Cranial nerves II through XII intact. PSYCH:  Cognitively intact      Wt Readings from Last 3 Encounters:  03/08/18 224 lb (101.6 kg)  02/24/18 228 lb (103.4 kg)  02/21/18 232 lb (105.2 kg)      Studies/Labs Reviewed:   EKG:  EKG is not ordered today.   Recent Labs: 10/27/2017: Magnesium 2.2 12/16/2017: ALT 16 02/21/2018: BUN 14; Creatinine, Ser 1.07; Hemoglobin 13.5; Platelets 186; Potassium 3.5; Sodium 141   Lipid Panel    Component Value Date/Time   CHOL 140 12/16/2017 1014   TRIG 111 12/16/2017 1014   HDL 40 12/16/2017 1014   CHOLHDL 3.5 12/16/2017 1014   CHOLHDL 4 07/25/2014 1029   VLDL 24.4 07/25/2014 1029   LDLCALC 78 12/16/2017 1014   Labs dated 04/28/17: normal TFTs, CMET, CBC, iron stores. Cholesterol 142, triglycerides 107, LDL 84, HDL 36.   Additional studies/ records that were reviewed today include:    Echo 07/26/2015 LV EF: 30% -   35%  Study Conclusions  - Left ventricle: There is akinesis of the mid anteroseptal,   anterior, apical septal, anterior, inferior walls and of the true    septum. The cavity size was normal. There was mild concentric   hypertrophy.  Systolic function was moderately to severely   reduced. The estimated ejection fraction was in the range of 30%   to 35%. Wall motion was normal; there were no regional wall   motion abnormalities. Doppler parameters are consistent with   abnormal left ventricular relaxation (grade 1 diastolic   dysfunction). There was no evidence of elevated ventricular   filling pressure by Doppler parameters. - Aortic valve: Trileaflet; normal thickness leaflets. There was   mild regurgitation. - Ascending aorta: The aortic root and ascending aorta were mildly   dilated measuring 40 mm. - Mitral valve: Calcified annulus. There was no regurgitation. - Left atrium: The atrium was mildly dilated. - Right ventricle: Pacer wire or catheter noted in right ventricle.   Systolic function was normal. - Right atrium: Pacer wire or catheter noted in right atrium. - Tricuspid valve: There was mild regurgitation. - Pulmonic valve: There was no regurgitation. - Pulmonary arteries: Systolic pressure was at the upper limits of   normal. PA peak pressure: 33 mm Hg (S). - Inferior vena cava: The vessel was normal in size.  Echo 08/18/17: Study Conclusions  - Left ventricle: The cavity size was normal. Wall thickness was   increased in a pattern of mild LVH. Systolic function was   moderately reduced. The estimated ejection fraction was in the   range of 35% to 40%. Anterior, anteroseptal, apical and   inferoapical hypokinesis. Doppler parameters are consistent with   abnormal left ventricular relaxation (grade 1 diastolic   dysfunction). The E/e&' ratio is <8, suggesting normal LV filling   pressure. - Aortic valve: Sclerosis without stenosis. There was mild   regurgitation. - Mitral valve: Mildly thickened leaflets . There was trivial   regurgitation. - Left atrium: The atrium was normal in size. - Right ventricle: The cavity size  was mildly dilated. Pacer or   AICD wire in right ventricle. Systolic function was normal. - Right atrium: The atrium was mildly dilated. Pacer or AICD wire   noted in right atrium. - Tricuspid valve: There was trivial regurgitation. - Pulmonary arteries: PA peak pressure: 25 mm Hg (S). - Inferior vena cava: The vessel was normal in size. The   respirophasic diameter changes were in the normal range (= 50%),   consistent with normal central venous pressure.  Impressions:  - Compared to a prior study in 07/2015, the LVEF has slightly   improved to 35-40% with persistent LAD territory wall motion   abnormalities.   Myoview 10/14/2016 Study Highlights    The left ventricular ejection fraction is moderately decreased (30-44%).  There was no ST segment deviation noted during stress.  There is a large defect of moderate severity present in the basal inferior, mid anterior, mid inferoseptal, mid inferior, apical anterior, apical septal, apical inferior, apical lateral and apex location. The defect is mostly fixed with a small area of reversibility in the inferoseptal region. This is consistent with a large area of scar and mild peri infarct ischemia in the inferoseptal wall.  This is a high risk study due to LV dysfunction and large area of scar with peri infarct ischemia in the inferoseptum.  Nuclear stress EF: 34%.      ASSESSMENT:    1. Chronic systolic CHF (congestive heart failure) (HCC)   2. Cardiomyopathy, ischemic   3. OSA on CPAP   4. ICD (implantable cardioverter-defibrillator) in place   5. Coronary artery disease involving native coronary artery of native heart without angina pectoris  PLAN:  In order of problems listed above:  1. CAD: s/p multiple PCIs, last PCI was performed on 07/06/2014 with successful DES to mid LAD for in stent restenosis. Myoview July 2018  showed stable EF 34%, large infarct consistent with previous MI with mild peri-infarct ischemia.  He has stable class 1 angina. Given recent hematuria we will discontinue Plavix and continue Coumadin only.    2. Chronic DVT with positive antiphospholipid antibody: On Coumadin  3. Chronic systolic heart failure: Ejection fraction 35-40% by most recent Echo.  Euvolemic on physical exam. Titration of medication has been difficult due to low BP and orthostasis. On Entresto. eplerenone discontinued due to hypotension. Weight is down 40 lbs.   4. Ischemic cardiomyopathy s/p ICD: Last interrogation on 11/04/2016, device functioning normally  5. Hypertension: Blood pressure is low on Entresto only.  6. OSA on CPAP: Managed by Dr. Claiborne Billings  7.   Hypercholesterolemia. LDL 78 on Crestor. Previously stopped lipitor due to myalgias. Goal LDL <70. Continue dietary modification.     Medication Adjustments/Labs and Tests Ordered: Current medicines are reviewed at length with the patient today.  Concerns regarding medicines are outlined above.  Medication changes, Labs and Tests ordered today are listed in the Patient Instructions below. There are no Patient Instructions on file for this visit.   Signed, Elisabetta Mishra Martin[place], MD  03/08/2018 1:34 PM    [facility], [place], [place]  [address] Phone: [phone]; Fax: [phone]

## 2018-03-08 ENCOUNTER — Encounter: Payer: Self-pay | Admitting: Cardiology

## 2018-03-08 ENCOUNTER — Ambulatory Visit (INDEPENDENT_AMBULATORY_CARE_PROVIDER_SITE_OTHER): Payer: PPO | Admitting: Cardiology

## 2018-03-08 ENCOUNTER — Other Ambulatory Visit: Payer: Self-pay

## 2018-03-08 VITALS — BP 99/72 | HR 95 | Ht 72.0 in | Wt 224.0 lb

## 2018-03-08 DIAGNOSIS — G4733 Obstructive sleep apnea (adult) (pediatric): Secondary | ICD-10-CM

## 2018-03-08 DIAGNOSIS — Z9989 Dependence on other enabling machines and devices: Secondary | ICD-10-CM

## 2018-03-08 DIAGNOSIS — I2510 Atherosclerotic heart disease of native coronary artery without angina pectoris: Secondary | ICD-10-CM | POA: Diagnosis not present

## 2018-03-08 DIAGNOSIS — Z95810 Presence of automatic (implantable) cardiac defibrillator: Secondary | ICD-10-CM

## 2018-03-08 DIAGNOSIS — I255 Ischemic cardiomyopathy: Secondary | ICD-10-CM

## 2018-03-08 DIAGNOSIS — I5022 Chronic systolic (congestive) heart failure: Secondary | ICD-10-CM | POA: Diagnosis not present

## 2018-03-10 DIAGNOSIS — R3914 Feeling of incomplete bladder emptying: Secondary | ICD-10-CM | POA: Diagnosis not present

## 2018-03-10 DIAGNOSIS — R310 Gross hematuria: Secondary | ICD-10-CM | POA: Diagnosis not present

## 2018-03-10 DIAGNOSIS — N401 Enlarged prostate with lower urinary tract symptoms: Secondary | ICD-10-CM | POA: Diagnosis not present

## 2018-03-28 ENCOUNTER — Ambulatory Visit: Payer: PPO

## 2018-03-28 ENCOUNTER — Ambulatory Visit (INDEPENDENT_AMBULATORY_CARE_PROVIDER_SITE_OTHER): Payer: PPO

## 2018-03-28 DIAGNOSIS — I5022 Chronic systolic (congestive) heart failure: Secondary | ICD-10-CM

## 2018-03-28 DIAGNOSIS — Z95810 Presence of automatic (implantable) cardiac defibrillator: Secondary | ICD-10-CM

## 2018-03-28 DIAGNOSIS — I255 Ischemic cardiomyopathy: Secondary | ICD-10-CM

## 2018-03-29 LAB — CUP PACEART REMOTE DEVICE CHECK
Battery Remaining Longevity: 74 mo
Battery Remaining Percentage: 69 %
Battery Voltage: 3.01 V
Brady Statistic RV Percent Paced: 1 %
Date Time Interrogation Session: 20200106070015
HighPow Impedance: 78 Ohm
HighPow Impedance: 78 Ohm
Implantable Lead Implant Date: 20160906
Implantable Lead Location: 753860
Implantable Pulse Generator Implant Date: 20160906
Lead Channel Impedance Value: 460 Ohm
Lead Channel Pacing Threshold Amplitude: 0.75 V
Lead Channel Pacing Threshold Pulse Width: 0.5 ms
Lead Channel Sensing Intrinsic Amplitude: 11.8 mV
Lead Channel Setting Pacing Amplitude: 2.5 V
Lead Channel Setting Pacing Pulse Width: 0.5 ms
Lead Channel Setting Sensing Sensitivity: 0.5 mV
Pulse Gen Serial Number: 7257239

## 2018-03-29 NOTE — Progress Notes (Signed)
Remote ICD transmission.   

## 2018-03-29 NOTE — Progress Notes (Signed)
EPIC Encounter for ICM Monitoring  Patient Name: Kevin Hart is a 73 y.o. male Date: 03/29/2018 Primary Care Physican: Guadlupe Spanish, MD Primary [place] Electrophysiologist: Allred Last [id number] lbs(baseline235-240 lbs)    Today's Weight: 217 lbs                                                   Spoke with patient.  He stated prostate surgery went well and he said he is doing fine.     Thoracic impedance slightly below baseline.  Decreased impedance correlates with hospitalization for prostate surgery in December.  Prescribed: Furosemide 40 mg 1 tablet daily.  Labs: 02/22/2019 Creatinine 1.07, BUN 14, Potassium 3.5, Sodium 141, eGFR >60 11/09/2017 Creatinine 1.26, BUN 21, Potassium 4.3, Sodium 138, EGFR 56-65 10/29/2017 Creatinine1.12, BUN20, Potassium4.6, Sodium139, EGFR>60 10/28/2017 Creatinine1.00, BUN22, Potassium4.1, Sodium138, EGFR>60  10/27/2017 Creatinine1.21, BUN27, Potassium4.3, Sodium140, EGFR59->60  10/26/2017 Creatinine1.26, BUN26, Potassium4.0, Sodium139, EGFR>60  10/25/2017 Creatinine1.49, BUN29, Potassium3.8, [id number], [id number] 07/19/2017 Creatinine 1.12, BUN 17, Potassium 4.0, Sodium 144, EGFR 65-75 04/28/2017 Creatinine 1.3, BUN 19, Potassium 3.9, Sodium 140  Recommendations: No changes.   Encouraged to call for fluid symptoms.  Follow-up plan: ICM clinic phone appointment on 06/07/2018.   Office appointment scheduled 05/04/2018 with Chanetta Marshall, NP.    Copy of ICM check sent to Dr. Rayann Heman and Dr Martinique.   3 month ICM trend: 03/28/2018    1 Year ICM trend:       Rosalene Billings, RN 03/29/2018 3:05 PM

## 2018-04-14 DIAGNOSIS — Z7901 Long term (current) use of anticoagulants: Secondary | ICD-10-CM | POA: Diagnosis not present

## 2018-04-14 DIAGNOSIS — G4730 Sleep apnea, unspecified: Secondary | ICD-10-CM | POA: Diagnosis not present

## 2018-04-14 DIAGNOSIS — Z0000 Encounter for general adult medical examination without abnormal findings: Secondary | ICD-10-CM | POA: Diagnosis not present

## 2018-04-14 DIAGNOSIS — I82409 Acute embolism and thrombosis of unspecified deep veins of unspecified lower extremity: Secondary | ICD-10-CM | POA: Diagnosis not present

## 2018-04-14 DIAGNOSIS — I509 Heart failure, unspecified: Secondary | ICD-10-CM | POA: Diagnosis not present

## 2018-04-21 DIAGNOSIS — I10 Essential (primary) hypertension: Secondary | ICD-10-CM | POA: Diagnosis not present

## 2018-04-21 DIAGNOSIS — I5089 Other heart failure: Secondary | ICD-10-CM | POA: Diagnosis not present

## 2018-04-21 DIAGNOSIS — K219 Gastro-esophageal reflux disease without esophagitis: Secondary | ICD-10-CM | POA: Diagnosis not present

## 2018-04-21 DIAGNOSIS — G4733 Obstructive sleep apnea (adult) (pediatric): Secondary | ICD-10-CM | POA: Diagnosis not present

## 2018-04-25 DIAGNOSIS — K219 Gastro-esophageal reflux disease without esophagitis: Secondary | ICD-10-CM | POA: Diagnosis not present

## 2018-04-25 DIAGNOSIS — G4733 Obstructive sleep apnea (adult) (pediatric): Secondary | ICD-10-CM | POA: Diagnosis not present

## 2018-04-25 DIAGNOSIS — I5089 Other heart failure: Secondary | ICD-10-CM | POA: Diagnosis not present

## 2018-04-28 ENCOUNTER — Telehealth: Payer: Self-pay

## 2018-04-28 DIAGNOSIS — R5383 Other fatigue: Secondary | ICD-10-CM | POA: Diagnosis not present

## 2018-04-28 DIAGNOSIS — E559 Vitamin D deficiency, unspecified: Secondary | ICD-10-CM | POA: Diagnosis not present

## 2018-04-28 DIAGNOSIS — R300 Dysuria: Secondary | ICD-10-CM | POA: Diagnosis not present

## 2018-04-28 DIAGNOSIS — M129 Arthropathy, unspecified: Secondary | ICD-10-CM | POA: Diagnosis not present

## 2018-04-28 DIAGNOSIS — E291 Testicular hypofunction: Secondary | ICD-10-CM | POA: Diagnosis not present

## 2018-04-28 DIAGNOSIS — Z79899 Other long term (current) drug therapy: Secondary | ICD-10-CM | POA: Diagnosis not present

## 2018-04-28 DIAGNOSIS — D539 Nutritional anemia, unspecified: Secondary | ICD-10-CM | POA: Diagnosis not present

## 2018-04-28 DIAGNOSIS — E7800 Pure hypercholesterolemia, unspecified: Secondary | ICD-10-CM | POA: Diagnosis not present

## 2018-04-28 DIAGNOSIS — Z7901 Long term (current) use of anticoagulants: Secondary | ICD-10-CM | POA: Diagnosis not present

## 2018-04-28 NOTE — Telephone Encounter (Signed)
PA for request received from cover my meds for Entesro 24-26mg . PA completes on covermymeds web site. Per covermymeds Entresto 24-26mg  is available without authorization

## 2018-05-01 NOTE — Progress Notes (Signed)
Electrophysiology Office Note Date: 05/02/2018  ID:  Kevin Hart, Kevin Hart 08-Nov-1945, MRN [id number]  PCP: Guadlupe Spanish, MD Primary Cardiologist: [place] Electrophysiologist: Allred  CC: Routine ICD follow-up  Kevin Hart is a 73 y.o. male seen today for Dr Rayann Heman.  He presents today for routine electrophysiology followup.  Since last being seen in our clinic, the patient reports doing very well. He denies chest pain, palpitations, dyspnea, PND, orthopnea, nausea, vomiting, dizziness, syncope, edema, weight gain, or early satiety.  He has not had ICD shocks.   Device History: STJ single chamber ICD implanted 2016 for ICM History of appropriate therapy: No History of AAD therapy: No   Past Medical History:  Diagnosis Date  . Arthritis   . Bladder stone 10/26/2017  . BPH (benign prostatic hyperplasia)    takes Flomax daily  . CAD (coronary artery disease)    a. prior LAD stenting;  b. 06/2014 NSTEMI/PCI: LM nl, LAD 30p, 60m ISR (3.0x20 Promus DES), LCX nl, RCA nondom, nl, EF 25-30% ant, apical, dist inf AK.  . Cataracts, bilateral    immature  . Chronic systolic CHF (congestive heart failure) ([place]) 12/31/2014  . DVT (deep venous thrombosis) (H[facility])    RECURRENT left leg  . Dyslipidemia   . GERD (gastroesophageal reflux disease)   . Gout   . History of kidney stones   . Hypertension   . Ischemic cardiomyopathy 07/06/2014  . Lupus anticoagulant positive   . Myocardial infarct ([place]) 2000  . Myocardial infarct ([place]) 2016  . S/P drug eluting coronary stent placement, 07/06/14, Promus 07/07/2014  . Sleep apnea    biPaP  . Subarachnoid bleed ([place]) 2015   after falling and hitting his head   Past Surgical History:  Procedure Laterality Date  . COLONOSCOPY    . EP IMPLANTABLE DEVICE [name] 11/27/2014   [employer] Fortify Assura VR  implanted by Dr Rayann Heman for primary prevention  . LEFT HEART CATHETERIZATION WITH CORONARY ANGIOGRAM [name] 07/06/2014   Procedure: LEFT HEART  CATHETERIZATION WITH CORONARY ANGIOGRAM;  Surgeon: Peter M Martinique, MD;  Location[facility];  Service: Cardiovascular;  Laterality: [name];  . PERCUTANEOUS CORONARY STENT INTERVENTION (PCI-S) Right 07/06/2014   Procedure: PERCUTANEOUS CORONARY STENT INTERVENTION (PCI-S);  Surgeon: Peter M Martinique, MD;  Location: [facility] CATH LAB;  Service: Cardiovascular;  Laterality: Right;  . SHOULDER ARTHROSCOPY WITH SUBACROMIAL DECOMPRESSION Left 03/05/2016   Procedure: SHOULDER ARTHROSCOPY ROTATOR CUFF REPAIR WITH SUBACROMIAL DECOMPRESSION;  Surgeon: Tania Ade, MD;  Location: [place];  Service: Orthopedics;  Laterality: Left;  SHOULDER ARTHROSCOPY ROTATOR CUFF REPAIR WITH SUBACROMIAL DECOMPRESSION  . SHOULDER CLOSED REDUCTION Left 11/29/2015   Procedure: CLOSED REDUCTION SHOULDER;  Surgeon: Tania Ade, MD;  Location: [place];  Service: Orthopedics;  Laterality: Left;  . TRANSURETHRAL RESECTION OF PROSTATE [name] 02/24/2018   Procedure: TRANSURETHRAL RESECTION OF THE PROSTATE (TURP);  Surgeon: Irine Seal, MD;  Location: [facility];  Service: Urology;  Laterality: [name];  . WRIST SURGERY Bilateral    plates and screws     Current Outpatient Medications  Medication Sig Dispense Refill  . allopurinol (ZYLOPRIM) 300 MG tablet Take 300 mg by mouth daily.     . furosemide (LASIX) 40 MG tablet Take 40 mg by mouth daily.     . Multiple Vitamin (MULTI-VITAMINS) TABS Take 1 tablet by mouth daily.     . nitroGLYCERIN (NITROSTAT) 0.4 MG SL tablet Place 1 tablet (0.4 mg total) under the tongue every 5 (five) minutes x  3 doses as needed for chest pain. 25 tablet 11  . pantoprazole (PROTONIX) 40 MG tablet Take 1 tablet (40 mg total) by mouth daily. 30 tablet 2  . rosuvastatin (CRESTOR) 20 MG tablet Take 1 tablet (20 mg total) by mouth daily. 90 tablet 3  . sacubitril-valsartan (ENTRESTO) 24-26 MG TAKE ONE TABLET BY MOUTH TWICE DAILY -TO REPLACE LISINOPRIL (START 36HOURS AFTER LAST DOSE) (Patient taking differently: Take 1 tablet by  mouth 2 (two) times daily. TAKE ONE TABLET BY MOUTH TWICE DAILY -TO REPLACE LISINOPRIL (START 36HOURS AFTER LAST DOSE)) 60 tablet 5  . warfarin (COUMADIN) 5 MG tablet Take 0.5-1 tablets (2.5-5 mg total) by mouth See admin instructions. Take 5 mg by mouth on Tuesday, Wednesday, Thursday, Saturday and Sunday then take 2.5 mg by mouth on Monday and Friday (Patient taking differently: Take 5 mg by mouth See admin instructions. Take 5 mg by mouth on Tuesday, Thursday, Saturday and Sunday)     No current facility-administered medications for this visit.     Allergies:   Patient has no active allergies.   Social History: Social History   Socioeconomic History  . Marital status: Married    Spouse name: Not on file  . Number of children: 6  . Years of education: Not on file  . Highest education level: Not on file  Occupational History  . Not on file  Social Needs  . Financial resource strain: Not on file  . Food insecurity:    Worry: Not on file    Inability: Not on file  . Transportation needs:    Medical: Not on file    Non-medical: Not on file  Tobacco Use  . Smoking status: Never Smoker  . Smokeless tobacco: Never Used  Substance and Sexual Activity  . Alcohol use: Yes    Alcohol/week: 0.0 standard drinks    Comment: seldom   . Drug use: No  . Sexual activity: Yes    Birth control/protection: None  Lifestyle  . Physical activity:    Days per week: Not on file    Minutes per session: Not on file  . Stress: Not on file  Relationships  . Social connections:    Talks on phone: Not on file    Gets together: Not on file    Attends religious service: Not on file    Active member of club or organization: Not on file    Attends meetings of clubs or organizations: Not on file    Relationship status: Not on file  . Intimate partner violence:    Fear of current or ex partner: Not on file    Emotionally abused: Not on file    Physically abused: Not on file    Forced sexual  activity: Not on file  Other Topics Concern  . Not on file  Social History Narrative   Pt lives in [place] with spouse.  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    Family History: Family History  Problem Relation Age of Onset  . Aneurysm Father     Review of Systems: All other systems reviewed and are otherwise negative except as noted above.   Physical Exam: VS:  BP 110/70   Pulse (!) 101   Ht 6' (1.829 m)   Wt 226 lb 12.8 oz (102.9 kg)   SpO2 96%   BMI 30.76 kg/m  , BMI Body mass index is 30.76 kg/m.  GEN- The patient is well appearing, alert and oriented x 3 today.  HEENT: normocephalic, atraumatic; sclera clear, conjunctiva pink; hearing intact; oropharynx clear; neck supple  Lungs- Clear to ausculation bilaterally, normal work of breathing.  No wheezes, rales, rhonchi Heart- Regular rate and rhythm  GI- soft, non-tender, non-distended, bowel sounds present  Extremities- no clubbing, cyanosis, or edema  MS- no significant deformity or atrophy Skin- warm and dry, no rash or lesion; ICD pocket well healed Psych- euthymic mood, full affect Neuro- strength and sensation are intact  ICD interrogation- reviewed in detail today,  See PACEART report  EKG:  EKG is not ordered today.  Recent Labs: 10/27/2017: Magnesium 2.2 12/16/2017: ALT 16 02/21/2018: BUN 14; Creatinine, Ser 1.07; Hemoglobin 13.5; Platelets 186; Potassium 3.5; Sodium 141   Wt Readings from Last 3 Encounters:  05/02/18 226 lb 12.8 oz (102.9 kg)  03/08/18 224 lb (101.6 kg)  02/24/18 228 lb (103.4 kg)     Other studies Reviewed: Additional studies/ records that were reviewed today include: Dr Rayann Heman and Dr Doug Sou office notes   Assessment and Plan:  1.  Chronic systolic dysfunction euvolemic today Stable on an appropriate medical regimen Normal ICD function See Pace Art report No changes today  2.  OSA Compliance with BiPap advised  3.  CAD No recent ischemic symptoms Continue  medical therapy  4.  Recurrent DVT with positive lupus anticoagulant Continue long term [place] with Warfarin   Current medicines are reviewed at length with the patient today.   The patient does not have concerns regarding his medicines.  The following changes were made today:  none  Labs/ tests ordered today include: none No orders of the defined types were placed in this encounter.    Disposition:   Follow up with [facility], Dr Martinique as scheduled, me in 1 year   Signed, Chanetta Marshall, NP 05/02/2018 11:53 AM  [address] [place] [address] [phone] (office) [phone] (fax)

## 2018-05-02 ENCOUNTER — Ambulatory Visit (INDEPENDENT_AMBULATORY_CARE_PROVIDER_SITE_OTHER): Payer: PPO | Admitting: Nurse Practitioner

## 2018-05-02 ENCOUNTER — Encounter: Payer: Self-pay | Admitting: Nurse Practitioner

## 2018-05-02 VITALS — BP 110/70 | HR 101 | Ht 72.0 in | Wt 226.8 lb

## 2018-05-02 DIAGNOSIS — I825Y9 Chronic embolism and thrombosis of unspecified deep veins of unspecified proximal lower extremity: Secondary | ICD-10-CM | POA: Diagnosis not present

## 2018-05-02 DIAGNOSIS — I2510 Atherosclerotic heart disease of native coronary artery without angina pectoris: Secondary | ICD-10-CM | POA: Diagnosis not present

## 2018-05-02 DIAGNOSIS — Z9989 Dependence on other enabling machines and devices: Secondary | ICD-10-CM | POA: Diagnosis not present

## 2018-05-02 DIAGNOSIS — G4733 Obstructive sleep apnea (adult) (pediatric): Secondary | ICD-10-CM

## 2018-05-02 DIAGNOSIS — I5022 Chronic systolic (congestive) heart failure: Secondary | ICD-10-CM | POA: Diagnosis not present

## 2018-05-02 LAB — CUP PACEART INCLINIC DEVICE CHECK
Date Time Interrogation Session: 20200210115440
Implantable Lead Implant Date: 20160906
Implantable Lead Location: 753860
Implantable Pulse Generator Implant Date: 20160906
Pulse Gen Serial Number: 7257239

## 2018-05-02 NOTE — Patient Instructions (Signed)
Medication Instructions:  NONE If you need a refill on your cardiac medications before your next appointment, please call your pharmacy.   Lab work: NONE If you have labs (blood work) drawn today and your tests are completely normal, you will receive your results only by: Marland Kitchen MyChart Message (if you have MyChart) OR . A paper copy in the mail If you have any lab test that is abnormal or we need to change your treatment, we will call you to review the results.  Testing/Procedures: NONE  Follow-Up: At [facility], you and your health needs are our priority.  As part of our continuing mission to provide you with exceptional heart care, we have created designated Provider Care Teams.  These Care Teams include your primary Cardiologist (physician) and Advanced Practice Providers (APPs -  Physician Assistants and Nurse Practitioners) who all work together to provide you with the care you need, when you need it. You will need a follow up appointment in 1 years.  Please call our office 2 months in advance to schedule this appointment.  You may see Dr Rayann Heman or one of the following Advanced Practice Providers on your designated Care Team:   Chanetta Marshall, NP . Tommye Standard, PA-C  Any Other Special Instructions Will Be Listed Below (If Applicable). Remote monitoring is used to monitor your  ICD from home. This monitoring reduces the number of office visits required to check your device to one time per year. It allows [place] to keep an eye on the functioning of your device to ensure it is working properly. You are scheduled for a device check from home on 06/27/18. You may send your transmission at any time that day. If you have a wireless device, the transmission will be sent automatically. After your physician reviews your transmission, you will receive a postcard with your next transmission date.

## 2018-05-04 ENCOUNTER — Encounter: Payer: PPO | Admitting: Nurse Practitioner

## 2018-05-19 DIAGNOSIS — K219 Gastro-esophageal reflux disease without esophagitis: Secondary | ICD-10-CM | POA: Diagnosis not present

## 2018-05-19 DIAGNOSIS — G4733 Obstructive sleep apnea (adult) (pediatric): Secondary | ICD-10-CM | POA: Diagnosis not present

## 2018-05-19 DIAGNOSIS — E559 Vitamin D deficiency, unspecified: Secondary | ICD-10-CM | POA: Diagnosis not present

## 2018-05-19 DIAGNOSIS — I10 Essential (primary) hypertension: Secondary | ICD-10-CM | POA: Diagnosis not present

## 2018-05-26 DIAGNOSIS — I509 Heart failure, unspecified: Secondary | ICD-10-CM | POA: Diagnosis not present

## 2018-05-26 DIAGNOSIS — I2510 Atherosclerotic heart disease of native coronary artery without angina pectoris: Secondary | ICD-10-CM | POA: Diagnosis not present

## 2018-05-26 DIAGNOSIS — Z7901 Long term (current) use of anticoagulants: Secondary | ICD-10-CM | POA: Diagnosis not present

## 2018-05-26 DIAGNOSIS — I82409 Acute embolism and thrombosis of unspecified deep veins of unspecified lower extremity: Secondary | ICD-10-CM | POA: Diagnosis not present

## 2018-05-26 DIAGNOSIS — I10 Essential (primary) hypertension: Secondary | ICD-10-CM | POA: Diagnosis not present

## 2018-05-31 DIAGNOSIS — L578 Other skin changes due to chronic exposure to nonionizing radiation: Secondary | ICD-10-CM | POA: Diagnosis not present

## 2018-05-31 DIAGNOSIS — Z08 Encounter for follow-up examination after completed treatment for malignant neoplasm: Secondary | ICD-10-CM | POA: Diagnosis not present

## 2018-05-31 DIAGNOSIS — L570 Actinic keratosis: Secondary | ICD-10-CM | POA: Diagnosis not present

## 2018-05-31 DIAGNOSIS — C4441 Basal cell carcinoma of skin of scalp and neck: Secondary | ICD-10-CM | POA: Diagnosis not present

## 2018-05-31 DIAGNOSIS — Z85828 Personal history of other malignant neoplasm of skin: Secondary | ICD-10-CM | POA: Diagnosis not present

## 2018-05-31 DIAGNOSIS — D225 Melanocytic nevi of trunk: Secondary | ICD-10-CM | POA: Diagnosis not present

## 2018-05-31 DIAGNOSIS — L820 Inflamed seborrheic keratosis: Secondary | ICD-10-CM | POA: Diagnosis not present

## 2018-06-07 ENCOUNTER — Ambulatory Visit (INDEPENDENT_AMBULATORY_CARE_PROVIDER_SITE_OTHER): Payer: PPO

## 2018-06-07 ENCOUNTER — Other Ambulatory Visit: Payer: Self-pay

## 2018-06-07 DIAGNOSIS — Z95810 Presence of automatic (implantable) cardiac defibrillator: Secondary | ICD-10-CM

## 2018-06-07 DIAGNOSIS — I5022 Chronic systolic (congestive) heart failure: Secondary | ICD-10-CM | POA: Diagnosis not present

## 2018-06-08 NOTE — Progress Notes (Signed)
EPIC Encounter for ICM Monitoring  Patient Name: Kevin Hart is a 73 y.o. male Date: 06/08/2018 Primary Care Physican: Guadlupe Spanish, MD Primary [place] Electrophysiologist: Allred 06/08/2018 Weight: 220 lbs  Spoke with patient.  Pt is asymptomatic.    Thoracic impedancenormal.    Prescribed:Furosemide 40 mg 1 tablet daily.  Labs: 02/21/2018 Creatinine 1.07, BUN 14, Potassium 3.5, Sodium 141, GFR >60 11/09/2017 Creatinine 1.26, BUN 21, Potassium 4.3, Sodium 138, GFR 56-65 10/29/2017 Creatinine1.12, BUN20, Potassium4.6, Sodium139, GFR>60 10/28/2017 Creatinine1.00, BUN22, Potassium4.1, Sodium138, GFR>60  10/27/2017 Creatinine1.21, BUN27, Potassium4.3, Sodium140, GFR59->60  10/26/2017 Creatinine1.26, BUN26, Potassium4.0, Sodium139, EGFR>60  10/25/2017 Creatinine1.49, BUN29, Potassium3.8, [id number], [id number] 07/19/2017 Creatinine 1.12, BUN 17, Potassium 4.0, Sodium 144, EGFR 65-75 04/28/2017 Creatinine 1.3, BUN 19, Potassium 3.9, Sodium 140  Recommendations:No changes. Encouraged to call for fluid symptoms.  Follow-up plan: ICM clinic phone appointment on 07/11/2018.     Copy of ICM check sent to Dr. Rayann Heman.   3 month ICM trend: 06/07/2018    1 Year ICM trend:       Rosalene Billings, RN 06/08/2018 11:21 AM

## 2018-06-27 ENCOUNTER — Other Ambulatory Visit: Payer: Self-pay

## 2018-06-27 ENCOUNTER — Ambulatory Visit (INDEPENDENT_AMBULATORY_CARE_PROVIDER_SITE_OTHER): Payer: PPO | Admitting: *Deleted

## 2018-06-27 DIAGNOSIS — J309 Allergic rhinitis, unspecified: Secondary | ICD-10-CM | POA: Diagnosis not present

## 2018-06-27 DIAGNOSIS — H25811 Combined forms of age-related cataract, right eye: Secondary | ICD-10-CM | POA: Diagnosis not present

## 2018-06-27 DIAGNOSIS — I255 Ischemic cardiomyopathy: Secondary | ICD-10-CM | POA: Diagnosis not present

## 2018-06-27 LAB — CUP PACEART REMOTE DEVICE CHECK
Battery Remaining Longevity: 71 mo
Battery Remaining Percentage: 67 %
Battery Voltage: 3.01 V
Brady Statistic RV Percent Paced: 1 %
Date Time Interrogation Session: 20200406095224
HighPow Impedance: 74 Ohm
HighPow Impedance: 74 Ohm
Implantable Lead Implant Date: 20160906
Implantable Lead Location: 753860
Implantable Pulse Generator Implant Date: 20160906
Lead Channel Impedance Value: 450 Ohm
Lead Channel Pacing Threshold Amplitude: 0.75 V
Lead Channel Pacing Threshold Pulse Width: 0.5 ms
Lead Channel Sensing Intrinsic Amplitude: 11.8 mV
Lead Channel Setting Pacing Amplitude: 2.5 V
Lead Channel Setting Pacing Pulse Width: 0.5 ms
Lead Channel Setting Sensing Sensitivity: 0.5 mV
Pulse Gen Serial Number: 7257239

## 2018-07-05 ENCOUNTER — Encounter: Payer: Self-pay | Admitting: Cardiology

## 2018-07-05 NOTE — Progress Notes (Signed)
Remote ICD transmission.   

## 2018-07-07 DIAGNOSIS — I10 Essential (primary) hypertension: Secondary | ICD-10-CM | POA: Diagnosis not present

## 2018-07-07 DIAGNOSIS — Z7901 Long term (current) use of anticoagulants: Secondary | ICD-10-CM | POA: Diagnosis not present

## 2018-07-07 DIAGNOSIS — E7800 Pure hypercholesterolemia, unspecified: Secondary | ICD-10-CM | POA: Diagnosis not present

## 2018-07-07 DIAGNOSIS — E782 Mixed hyperlipidemia: Secondary | ICD-10-CM | POA: Diagnosis not present

## 2018-07-07 DIAGNOSIS — R251 Tremor, unspecified: Secondary | ICD-10-CM | POA: Diagnosis not present

## 2018-07-07 DIAGNOSIS — G894 Chronic pain syndrome: Secondary | ICD-10-CM | POA: Diagnosis not present

## 2018-07-07 DIAGNOSIS — I2510 Atherosclerotic heart disease of native coronary artery without angina pectoris: Secondary | ICD-10-CM | POA: Diagnosis not present

## 2018-07-11 ENCOUNTER — Ambulatory Visit (INDEPENDENT_AMBULATORY_CARE_PROVIDER_SITE_OTHER): Payer: PPO

## 2018-07-11 ENCOUNTER — Other Ambulatory Visit: Payer: Self-pay

## 2018-07-11 DIAGNOSIS — Z95810 Presence of automatic (implantable) cardiac defibrillator: Secondary | ICD-10-CM | POA: Diagnosis not present

## 2018-07-11 DIAGNOSIS — I5022 Chronic systolic (congestive) heart failure: Secondary | ICD-10-CM | POA: Diagnosis not present

## 2018-07-11 NOTE — Progress Notes (Signed)
EPIC Encounter for ICM Monitoring  Patient Name: Kevin Hart is a 73 y.o. male Date: 07/11/2018 Primary Care Physican: Guadlupe Spanish, MD Primary [place] Electrophysiologist: Allred 06/08/2018 Weight: 220 lbs 07/11/2018 Weight: 220 lbs  Spoke with patient. Pt is asymptomatic.   Thoracic impedancenormal.    Prescribed:Furosemide 40 mg 1 tablet daily.  Labs: 02/21/2018 Creatinine 1.07, BUN 14, Potassium 3.5, Sodium 141, GFR >60 11/09/2017 Creatinine 1.26, BUN 21, Potassium 4.3, Sodium 138, GFR 56-65 10/29/2017 Creatinine1.12, BUN20, Potassium4.6, Sodium139, GFR>60 10/28/2017 Creatinine1.00, BUN22, Potassium4.1, Sodium138, GFR>60  10/27/2017 Creatinine1.21, BUN27, Potassium4.3, Sodium140, GFR59->60  10/26/2017 Creatinine1.26, BUN26, Potassium4.0, Sodium139, EGFR>60  10/25/2017 Creatinine1.49, BUN29, Potassium3.8, [id number], [id number] 07/19/2017 Creatinine 1.12, BUN 17, Potassium 4.0, Sodium 144, EGFR 65-75 04/28/2017 Creatinine 1.3, BUN 19, Potassium 3.9, Sodium 140  Recommendations:No changes. Encouraged to call for fluid symptoms.  Follow-up plan: ICM clinic phone appointment on 08/12/2018.     Copy of ICM check sent to Dr. Rayann Heman.   3 month ICM trend: 07/11/2018    1 Year ICM trend:       Rosalene Billings, RN 07/11/2018 1:47 PM

## 2018-07-12 DIAGNOSIS — G4733 Obstructive sleep apnea (adult) (pediatric): Secondary | ICD-10-CM | POA: Diagnosis not present

## 2018-07-20 DIAGNOSIS — I2510 Atherosclerotic heart disease of native coronary artery without angina pectoris: Secondary | ICD-10-CM | POA: Diagnosis not present

## 2018-07-20 DIAGNOSIS — I10 Essential (primary) hypertension: Secondary | ICD-10-CM | POA: Diagnosis not present

## 2018-07-20 DIAGNOSIS — I509 Heart failure, unspecified: Secondary | ICD-10-CM | POA: Diagnosis not present

## 2018-07-20 DIAGNOSIS — G4733 Obstructive sleep apnea (adult) (pediatric): Secondary | ICD-10-CM | POA: Diagnosis not present

## 2018-07-26 ENCOUNTER — Emergency Department (HOSPITAL_COMMUNITY): Payer: PPO

## 2018-07-26 ENCOUNTER — Other Ambulatory Visit: Payer: Self-pay

## 2018-07-26 ENCOUNTER — Encounter (HOSPITAL_COMMUNITY): Payer: Self-pay | Admitting: Emergency Medicine

## 2018-07-26 ENCOUNTER — Telehealth: Payer: Self-pay | Admitting: Cardiology

## 2018-07-26 ENCOUNTER — Inpatient Hospital Stay (HOSPITAL_COMMUNITY)
Admission: EM | Admit: 2018-07-26 | Discharge: 2018-07-29 | DRG: 287 | Disposition: A | Discharge status: Home / Self Care | Payer: PPO | Attending: Internal Medicine | Admitting: Internal Medicine

## 2018-07-26 DIAGNOSIS — Z9989 Dependence on other enabling machines and devices: Secondary | ICD-10-CM | POA: Diagnosis not present

## 2018-07-26 DIAGNOSIS — R072 Precordial pain: Secondary | ICD-10-CM

## 2018-07-26 DIAGNOSIS — R079 Chest pain, unspecified: Secondary | ICD-10-CM | POA: Diagnosis not present

## 2018-07-26 DIAGNOSIS — I200 Unstable angina: Secondary | ICD-10-CM | POA: Diagnosis not present

## 2018-07-26 DIAGNOSIS — I252 Old myocardial infarction: Secondary | ICD-10-CM | POA: Diagnosis not present

## 2018-07-26 DIAGNOSIS — T463X5A Adverse effect of coronary vasodilators, initial encounter: Secondary | ICD-10-CM | POA: Diagnosis not present

## 2018-07-26 DIAGNOSIS — M1990 Unspecified osteoarthritis, unspecified site: Secondary | ICD-10-CM | POA: Diagnosis present

## 2018-07-26 DIAGNOSIS — Z86718 Personal history of other venous thrombosis and embolism: Secondary | ICD-10-CM | POA: Diagnosis not present

## 2018-07-26 DIAGNOSIS — G4440 Drug-induced headache, not elsewhere classified, not intractable: Secondary | ICD-10-CM | POA: Diagnosis not present

## 2018-07-26 DIAGNOSIS — Z87442 Personal history of urinary calculi: Secondary | ICD-10-CM

## 2018-07-26 DIAGNOSIS — D6862 Lupus anticoagulant syndrome: Secondary | ICD-10-CM | POA: Diagnosis not present

## 2018-07-26 DIAGNOSIS — Z9079 Acquired absence of other genital organ(s): Secondary | ICD-10-CM | POA: Diagnosis not present

## 2018-07-26 DIAGNOSIS — G4733 Obstructive sleep apnea (adult) (pediatric): Secondary | ICD-10-CM | POA: Diagnosis present

## 2018-07-26 DIAGNOSIS — Z79899 Other long term (current) drug therapy: Secondary | ICD-10-CM | POA: Diagnosis not present

## 2018-07-26 DIAGNOSIS — Z95810 Presence of automatic (implantable) cardiac defibrillator: Secondary | ICD-10-CM

## 2018-07-26 DIAGNOSIS — E785 Hyperlipidemia, unspecified: Secondary | ICD-10-CM | POA: Diagnosis present

## 2018-07-26 DIAGNOSIS — Z7982 Long term (current) use of aspirin: Secondary | ICD-10-CM | POA: Diagnosis not present

## 2018-07-26 DIAGNOSIS — N400 Enlarged prostate without lower urinary tract symptoms: Secondary | ICD-10-CM | POA: Diagnosis present

## 2018-07-26 DIAGNOSIS — I5022 Chronic systolic (congestive) heart failure: Secondary | ICD-10-CM | POA: Diagnosis not present

## 2018-07-26 DIAGNOSIS — Z8249 Family history of ischemic heart disease and other diseases of the circulatory system: Secondary | ICD-10-CM

## 2018-07-26 DIAGNOSIS — I440 Atrioventricular block, first degree: Secondary | ICD-10-CM | POA: Diagnosis present

## 2018-07-26 DIAGNOSIS — I25110 Atherosclerotic heart disease of native coronary artery with unstable angina pectoris: Principal | ICD-10-CM | POA: Diagnosis present

## 2018-07-26 DIAGNOSIS — E876 Hypokalemia: Secondary | ICD-10-CM | POA: Diagnosis present

## 2018-07-26 DIAGNOSIS — I959 Hypotension, unspecified: Secondary | ICD-10-CM | POA: Diagnosis present

## 2018-07-26 DIAGNOSIS — Z7901 Long term (current) use of anticoagulants: Secondary | ICD-10-CM | POA: Diagnosis not present

## 2018-07-26 DIAGNOSIS — I110 Hypertensive heart disease with heart failure: Secondary | ICD-10-CM | POA: Diagnosis present

## 2018-07-26 DIAGNOSIS — M109 Gout, unspecified: Secondary | ICD-10-CM | POA: Diagnosis present

## 2018-07-26 DIAGNOSIS — I2510 Atherosclerotic heart disease of native coronary artery without angina pectoris: Secondary | ICD-10-CM | POA: Diagnosis not present

## 2018-07-26 DIAGNOSIS — K219 Gastro-esophageal reflux disease without esophagitis: Secondary | ICD-10-CM | POA: Diagnosis present

## 2018-07-26 DIAGNOSIS — R42 Dizziness and giddiness: Secondary | ICD-10-CM | POA: Diagnosis not present

## 2018-07-26 DIAGNOSIS — I255 Ischemic cardiomyopathy: Secondary | ICD-10-CM | POA: Diagnosis not present

## 2018-07-26 DIAGNOSIS — Z955 Presence of coronary angioplasty implant and graft: Secondary | ICD-10-CM | POA: Diagnosis not present

## 2018-07-26 LAB — BASIC METABOLIC PANEL
Anion gap: 10 (ref 5–15)
BUN: 18 mg/dL (ref 8–23)
CO2: 23 mmol/L (ref 22–32)
Calcium: 9.3 mg/dL (ref 8.9–10.3)
Chloride: 107 mmol/L (ref 98–111)
Creatinine, Ser: 1.21 mg/dL (ref 0.61–1.24)
GFR calc Af Amer: >60 mL/min (ref >60)
GFR calc non Af Amer: 59 mL/min — ABNORMAL LOW (ref >60)
Glucose, Bld: 117 mg/dL — ABNORMAL HIGH (ref 70–99)
Potassium: 3.4 mmol/L — ABNORMAL LOW (ref 3.5–5.1)
Sodium: 140 mmol/L (ref 135–145)

## 2018-07-26 LAB — CBC
HCT: 42.7 % (ref 39.0–52.0)
Hemoglobin: 14.6 g/dL (ref 13.0–17.0)
MCH: 30.3 pg (ref 26.0–34.0)
MCHC: 34.2 g/dL (ref 30.0–36.0)
MCV: 88.6 fL (ref 80.0–100.0)
Platelets: 185 10*3/uL (ref 150–400)
RBC: 4.82 MIL/uL (ref 4.22–5.81)
RDW: 14.9 % (ref 11.5–15.5)
WBC: 6.3 10*3/uL (ref 4.0–10.5)
nRBC: 0 % (ref 0.0–0.2)

## 2018-07-26 LAB — PROTIME-INR
INR: 3.5 — ABNORMAL HIGH (ref 0.8–1.2)
Prothrombin Time: 34.3 seconds — ABNORMAL HIGH (ref 11.4–15.2)

## 2018-07-26 LAB — TROPONIN I: Troponin I: <0.03 ng/mL (ref <0.03)

## 2018-07-26 MED ORDER — WARFARIN SODIUM 5 MG PO TABS: 5.0000 mg | ORAL_TABLET | ORAL | Status: DC

## 2018-07-26 MED ORDER — PANTOPRAZOLE SODIUM 40 MG PO TBEC
40.0000 mg | DELAYED_RELEASE_TABLET | Freq: Every day | ORAL | Status: DC
  Administered 2018-07-27 – 2018-07-29 (×3): 40 mg via ORAL
  Filled 2018-07-26 (×4): qty 1

## 2018-07-26 MED ORDER — SENNOSIDES-DOCUSATE SODIUM 8.6-50 MG PO TABS: 1.0000 | ORAL_TABLET | Freq: Every evening | ORAL | Status: DC | PRN

## 2018-07-26 MED ORDER — ADULT MULTIVITAMIN W/MINERALS CH
1.0000 | ORAL_TABLET | Freq: Every day | ORAL | Status: DC
  Administered 2018-07-27 – 2018-07-29 (×3): 1 via ORAL
  Filled 2018-07-26 (×4): qty 1

## 2018-07-26 MED ORDER — SODIUM CHLORIDE 0.9% FLUSH
3.0000 mL | Freq: Once | INTRAVENOUS | Status: AC
  Administered 2018-07-26: 3 mL via INTRAVENOUS

## 2018-07-26 MED ORDER — NITROGLYCERIN 0.4 MG SL SUBL: 0.4000 mg | SUBLINGUAL_TABLET | SUBLINGUAL | Status: DC | PRN

## 2018-07-26 MED ORDER — ACETAMINOPHEN 325 MG PO TABS
650.0000 mg | ORAL_TABLET | Freq: Four times a day (QID) | ORAL | Status: DC | PRN
  Administered 2018-07-27: 650 mg via ORAL
  Filled 2018-07-26: qty 2

## 2018-07-26 MED ORDER — FUROSEMIDE 40 MG PO TABS
40.0000 mg | ORAL_TABLET | Freq: Every day | ORAL | Status: DC
  Administered 2018-07-27 – 2018-07-28 (×2): 40 mg via ORAL
  Filled 2018-07-26 (×3): qty 1

## 2018-07-26 MED ORDER — SACUBITRIL-VALSARTAN 24-26 MG PO TABS
1.0000 | ORAL_TABLET | Freq: Two times a day (BID) | ORAL | Status: DC
  Administered 2018-07-26 – 2018-07-29 (×6): 1 via ORAL
  Filled 2018-07-26 (×6): qty 1

## 2018-07-26 MED ORDER — PROMETHAZINE HCL 25 MG PO TABS: 12.5000 mg | ORAL_TABLET | Freq: Four times a day (QID) | ORAL | Status: DC | PRN

## 2018-07-26 MED ORDER — ALLOPURINOL 300 MG PO TABS
300.0000 mg | ORAL_TABLET | Freq: Every day | ORAL | Status: DC
  Administered 2018-07-27 – 2018-07-29 (×3): 300 mg via ORAL
  Filled 2018-07-26 (×4): qty 1

## 2018-07-26 MED ORDER — HEPARIN SODIUM (PORCINE) 5000 UNIT/ML IJ SOLN: 5000.0000 [IU] | Freq: Three times a day (TID) | INTRAMUSCULAR | Status: DC

## 2018-07-26 MED ORDER — ACETAMINOPHEN 650 MG RE SUPP: 650.0000 mg | Freq: Four times a day (QID) | RECTAL | Status: DC | PRN

## 2018-07-26 MED ORDER — ROSUVASTATIN CALCIUM 20 MG PO TABS
20.0000 mg | ORAL_TABLET | Freq: Every day | ORAL | Status: DC
  Administered 2018-07-26 – 2018-07-29 (×4): 20 mg via ORAL
  Filled 2018-07-26 (×5): qty 1

## 2018-07-26 NOTE — ED Triage Notes (Signed)
Pt states he started having CP a few days ago and the pain has been intermittent. Pt reports episodes of feeling like his heart was racing, and low BP 90 systolic. Pt has hx of MI. Denies N/V.

## 2018-07-26 NOTE — Progress Notes (Addendum)
RT Note:  Discussed wearing Bipap tonight with patient.  Patient stated he was hooked up to enough stuff.  He did state that he used Bipap at home but refused Bipap for tonight.  I informed patient that if at any time he changed his mind, to let the RN know and we would bring him one to use.  Will continue to monitor.

## 2018-07-26 NOTE — Progress Notes (Signed)
ANTICOAGULATION CONSULT NOTE - Initial Consult  Pharmacy Consult for warfarin Indication: DVT/lupus anticoagulant  No Known Allergies  Patient Measurements: Height: 6' (182.9 cm) Weight: 220 lb (99.8 kg) IBW/kg (Calculated) : 77.6  Vital Signs: Temp: 98 F (36.7 C) (05/05 1723) Temp Source: Oral (05/05 1723) BP: 116/88 (05/05 2100) Pulse Rate: 81 (05/05 2100)  Labs: Recent Labs    07/26/18 1724 07/26/18 1934  HGB 14.6  --   HCT 42.7  --   PLT 185  --   LABPROT  --  34.3*  INR  --  3.5*  CREATININE 1.21  --   TROPONINI <0.03  --     Estimated Creatinine Clearance: 65.5 mL/min (by C-G formula based on SCr of 1.21 mg/dL).   Medical History: Past Medical History:  Diagnosis Date  . Arthritis   . Bladder stone 10/26/2017  . BPH (benign prostatic hyperplasia)    takes Flomax daily  . CAD (coronary artery disease)    a. prior LAD stenting;  b. 06/2014 NSTEMI/PCI: LM nl, LAD 30p, 60m ISR (3.0x20 Promus DES), LCX nl, RCA nondom, nl, EF 25-30% ant, apical, dist inf [place].  . Cataracts, bilateral    immature  . Chronic systolic CHF (congestive heart failure) ([place]) 12/31/2014  . DVT (deep venous thrombosis) ([facility])    RECURRENT left leg  . Dyslipidemia   . GERD (gastroesophageal reflux disease)   . Gout   . History of kidney stones   . Hypertension   . Ischemic cardiomyopathy 07/06/2014  . Lupus anticoagulant positive   . Myocardial infarct ([place]) 2000  . Myocardial infarct ([place]) 2016  . S/P drug eluting coronary stent placement, 07/06/14, Promus 07/07/2014  . Sleep apnea    biPaP  . Subarachnoid bleed ([place]) 2015   after falling and hitting his head     Assessment: 64 yoM on warfarin PTA for hx recurrent DVTs and hx of lupus anticoagulant admitted with CP. Pharmacy asked to continue warfarin. INR elevated on admit at 3.50 - last dose of warfarin was 5/3. CBC stable, troponins negative.  *PTA Warfarin dose = 2.5mg  Mon/Thurs, 5mg  all other days  Goal of Therapy:  INR  2-3 Monitor platelets by anticoagulation protocol: Yes   Plan:  -Hold warfarin tonight -Daily protime  Arrie Senate, PharmD, BCPS Clinical Pharmacist [phone] Please check AMION for all [place] numbers 07/26/2018

## 2018-07-26 NOTE — ED Provider Notes (Signed)
[facility] EMERGENCY DEPARTMENT Provider Note   CSN: [id number] Arrival date & time: 07/26/18  1709    History   Chief Complaint Chief Complaint  Patient presents with  . Chest Pain    HPI Kevin Hart is a 73 y.o. male.     Patient w hx cad, c/o mid to left chest pain at rest, or with mild physical activity in the past 2-3 days. Pain episodic, moderate, non radiating, not pleuritic. Did not take ntg. Felt somewhat similar to prior cardiac chest pain. Denies associated sob, nv or diaphoresis. No new leg pain or swelling. Remote hx dvt, states compliant w anticoag therapy. Denies cough or uri symptoms. No fever or chills. No heartburn. States called his cardiologist office and was told to go to ER.   The history is provided by the patient.  Chest Pain  Associated symptoms: no abdominal pain, no back pain, no cough, no fever, no headache, no palpitations, no shortness of breath and no vomiting     Past Medical History:  Diagnosis Date  . Arthritis   . Bladder stone 10/26/2017  . BPH (benign prostatic hyperplasia)    takes Flomax daily  . CAD (coronary artery disease)    a. prior LAD stenting;  b. 06/2014 NSTEMI/PCI: LM nl, LAD 30p, 79m ISR (3.0x20 Promus DES), LCX nl, RCA nondom, nl, EF 25-30% ant, apical, dist inf [place].  . Cataracts, bilateral    immature  . Chronic systolic CHF (congestive heart failure) ([place]) 12/31/2014  . DVT (deep venous thrombosis) (H[facility])    RECURRENT left leg  . Dyslipidemia   . GERD (gastroesophageal reflux disease)   . Gout   . History of kidney stones   . Hypertension   . Ischemic cardiomyopathy 07/06/2014  . Lupus anticoagulant positive   . Myocardial infarct ([place]) 2000  . Myocardial infarct ([place]) 2016  . S/P drug eluting coronary stent placement, 07/06/14, Promus 07/07/2014  . Sleep apnea    biPaP  . Subarachnoid bleed ([place]) 2015   after falling and hitting his head    Patient Active Problem List   Diagnosis Date Noted   . BPH with urinary obstruction 02/24/2018  . Renal hemorrhage, right 10/26/2017  . S/P left rotator cuff repair 03/05/2016  . Anterior dislocation of left shoulder 11/29/2015  . History of implantable cardioverter-defibrillator (ICD) placement 03/16/2015  . AICD (automatic cardioverter/defibrillator) present   . Chronic systolic CHF (congestive heart failure) ([place]) 12/31/2014  . Chronic systolic dysfunction of left ventricle 11/27/2014  . S/P drug eluting coronary stent placement, mLAD 07/06/14, Promus 07/07/2014  . At risk for sudden cardiac death, EF 25-30% with PVCs, with lifevest 07/07/2014  . Coronary artery disease involving native coronary artery of native heart with unstable angina pectoris ([place])   . Cardiomyopathy, ischemic   . History of DVT (deep vein thrombosis), on coumadin   . NSTEMI (non-ST elevated myocardial infarction) ([place]) 07/05/2014  . CAD (coronary artery disease)   . DVT (deep venous thrombosis), hx on coumadin   . Dyslipidemia   . GERD (gastroesophageal reflux disease)   . Anterior myocardial infarction ([place])   . Lupus anticoagulant positive     Past Surgical History:  Procedure Laterality Date  . COLONOSCOPY    . EP IMPLANTABLE DEVICE [name] 11/27/2014   [employer] Fortify Assura VR  implanted by Dr Rayann Heman for primary prevention  . LEFT HEART CATHETERIZATION WITH CORONARY ANGIOGRAM [name] 07/06/2014   Procedure: LEFT HEART CATHETERIZATION WITH CORONARY ANGIOGRAM;  Surgeon: Peter M Martinique, MD;  Location[facility] C[facility] LAB;  Service: Cardiovascular;  Laterality: [name];  . PERCUTANEOUS CORONARY STENT INTERVENTION (PCI-S) Right 07/06/2014   Procedure: PERCUTANEOUS CORONARY STENT INTERVENTION (PCI-S);  Surgeon: Peter M Martinique, MD;  Location: [facility] CATH LAB;  Service: Cardiovascular;  Laterality: Right;  . SHOULDER ARTHROSCOPY WITH SUBACROMIAL DECOMPRESSION Left 03/05/2016   Procedure: SHOULDER ARTHROSCOPY ROTATOR CUFF REPAIR WITH SUBACROMIAL DECOMPRESSION;  Surgeon: Tania Ade, MD;  Location: [facility];  Service: Orthopedics;  Laterality: Left;  SHOULDER ARTHROSCOPY ROTATOR CUFF REPAIR WITH SUBACROMIAL DECOMPRESSION  . SHOULDER CLOSED REDUCTION Left 11/29/2015   Procedure: CLOSED REDUCTION SHOULDER;  Surgeon: Tania Ade, MD;  Location: [facility];  Service: Orthopedics;  Laterality: Left;  . TRANSURETHRAL RESECTION OF PROSTATE [name] 02/24/2018   Procedure: TRANSURETHRAL RESECTION OF THE PROSTATE (TURP);  Surgeon: Irine Seal, MD;  Location: [facility];  Service: Urology;  Laterality: [name];  . WRIST SURGERY Bilateral    plates and screws         Home Medications    Prior to Admission medications   Medication Sig Start Date End Date Taking? Authorizing Provider  allopurinol (ZYLOPRIM) 300 MG tablet Take 300 mg by mouth daily.  02/27/16   [provider]  furosemide (LASIX) 40 MG tablet Take 40 mg by mouth daily.     [provider]  Multiple Vitamin (MULTI-VITAMINS) TABS Take 1 tablet by mouth daily.     [provider]  nitroGLYCERIN (NITROSTAT) 0.4 MG SL tablet Place 1 tablet (0.4 mg total) under the tongue every 5 (five) minutes x 3 doses as needed for chest pain. 10/09/14   Martinique, Peter M, MD  pantoprazole (PROTONIX) 40 MG tablet Take 1 tablet (40 mg total) by mouth daily. 07/07/14   Lendon Colonel, NP  rosuvastatin (CRESTOR) 20 MG tablet Take 1 tablet (20 mg total) by mouth daily. 11/09/17 05/02/18  Almyra Deforest, PA  sacubitril-valsartan (ENTRESTO) 24-26 MG TAKE ONE TABLET BY MOUTH TWICE DAILY -TO REPLACE LISINOPRIL (START 36HOURS AFTER LAST DOSE) Patient taking differently: Take 1 tablet by mouth 2 (two) times daily. TAKE ONE TABLET BY MOUTH TWICE DAILY -TO REPLACE LISINOPRIL (START 36HOURS AFTER LAST DOSE) 11/29/17   Martinique, Peter M, MD  warfarin (COUMADIN) 5 MG tablet Take 0.5-1 tablets (2.5-5 mg total) by mouth See admin instructions. Take 5 mg by mouth on Tuesday, Wednesday, Thursday, Saturday and Sunday then take 2.5 mg by mouth on Monday  and Friday Patient taking differently: Take 5 mg by mouth See admin instructions. Take 5 mg by mouth on Tuesday, Thursday, Saturday and Sunday 11/01/17   Cristal Ford, DO    Family History Family History  Problem Relation Age of Onset  . Aneurysm Father     Social History Social History   Tobacco Use  . Smoking status: Never Smoker  . Smokeless tobacco: Never Used  Substance Use Topics  . Alcohol use: Yes    Alcohol/week: 0.0 standard drinks    Comment: seldom   . Drug use: No     Allergies   Patient has no known allergies.   Review of Systems Review of Systems  Constitutional: Negative for fever.  HENT: Negative for sore throat.   Eyes: Negative for redness.  Respiratory: Negative for cough and shortness of breath.   Cardiovascular: Positive for chest pain. Negative for palpitations.  Gastrointestinal: Negative for abdominal pain and vomiting.  Genitourinary: Negative for flank pain.  Musculoskeletal: Negative for back pain and neck pain.  Skin: Negative  for rash.  Neurological: Negative for headaches.  Hematological: Does not bruise/bleed easily.  Psychiatric/Behavioral: Negative for confusion.     Physical Exam Updated Vital Signs BP 113/70   Pulse 80   Temp 98 F (36.7 C) (Oral)   Resp 20   Ht 1.829 m (6')   Wt 99.8 kg   SpO2 100%   BMI 29.84 kg/m   Physical Exam Vitals signs and nursing note reviewed.  Constitutional:      Appearance: Normal appearance. He is well-developed.  HENT:     Head: Atraumatic.     Nose: Nose normal.     Mouth/Throat:     Mouth: Mucous membranes are moist.     Pharynx: Oropharynx is clear.  Eyes:     General: No scleral icterus.    Conjunctiva/sclera: Conjunctivae normal.  Neck:     Musculoskeletal: Normal range of motion and neck supple. No neck rigidity.     Trachea: No tracheal deviation.  Cardiovascular:     Rate and Rhythm: Normal rate and regular rhythm.     Pulses: Normal pulses.     Heart sounds:  Normal heart sounds. No murmur. No friction rub. No gallop.   Pulmonary:     Effort: Pulmonary effort is normal. No accessory muscle usage or respiratory distress.     Breath sounds: Normal breath sounds.  Chest:     Chest wall: No tenderness.  Abdominal:     General: Bowel sounds are normal. There is no distension.     Palpations: Abdomen is soft.     Tenderness: There is no abdominal tenderness. There is no guarding.  Genitourinary:    Comments: No cva tenderness. Musculoskeletal:        General: No swelling or tenderness.  Skin:    General: Skin is warm and dry.     Findings: No rash.  Neurological:     Mental Status: He is alert.     Comments: Alert, speech clear.   Psychiatric:        Mood and Affect: Mood normal.      ED Treatments / Results  Labs (all labs ordered are listed, but only abnormal results are displayed) Results for orders placed or performed during the hospital encounter of 41/74/08  Basic metabolic panel  Result Value Ref Range   Sodium 140 135 - 145 mmol/L   Potassium 3.4 (L) 3.5 - 5.1 mmol/L   Chloride 107 98 - 111 mmol/L   CO2 23 22 - 32 mmol/L   Glucose, Bld 117 (H) 70 - 99 mg/dL   BUN 18 8 - 23 mg/dL   Creatinine, Ser 1.21 0.61 - 1.24 mg/dL   Calcium 9.3 8.9 - 10.3 mg/dL   GFR calc non Af Amer 59 (L) >60 mL/min   GFR calc Af Amer >60 >60 mL/min   Anion gap 10 5 - 15  CBC  Result Value Ref Range   WBC 6.3 4.0 - 10.5 K/uL   RBC 4.82 4.22 - 5.81 MIL/uL   Hemoglobin 14.6 13.0 - 17.0 g/dL   HCT 42.7 39.0 - 52.0 %   MCV 88.6 80.0 - 100.0 fL   MCH 30.3 26.0 - 34.0 pg   MCHC 34.2 30.0 - 36.0 g/dL   RDW 14.9 11.5 - 15.5 %   Platelets 185 150 - 400 K/uL   nRBC 0.0 0.0 - 0.2 %  Troponin I - ONCE - STAT  Result Value Ref Range   Troponin I <0.03 <0.03 ng/mL  Dg Chest 2 View  Result Date: 07/26/2018 CLINICAL DATA:  Chest pain EXAM: CHEST - 2 VIEW COMPARISON:  10/26/2017 FINDINGS: The lungs are hyperinflated likely secondary to COPD. There is  no focal consolidation. There is no pleural effusion or pneumothorax. The heart and mediastinal contours are unremarkable. There is a single lead cardiac pacemaker. The osseous structures are unremarkable. IMPRESSION: No active cardiopulmonary disease. Electronically Signed   By: Kathreen Devoid   On: 07/26/2018 17:49    EKG EKG Interpretation  Date/Time:  Tuesday Jul 26 2018 17:20:06 EDT Ventricular Rate:  94 PR Interval:  210 QRS Duration: 86 QT Interval:  342 QTC Calculation: 427 R Axis:   0 Text Interpretation:  Sinus rhythm with 1st degree A-V block Low voltage QRS No significant change since last tracing Confirmed by Lajean Saver [phone]) on 07/26/2018 6:33:05 PM   Radiology Dg Chest 2 View  Result Date: 07/26/2018 CLINICAL DATA:  Chest pain EXAM: CHEST - 2 VIEW COMPARISON:  10/26/2017 FINDINGS: The lungs are hyperinflated likely secondary to COPD. There is no focal consolidation. There is no pleural effusion or pneumothorax. The heart and mediastinal contours are unremarkable. There is a single lead cardiac pacemaker. The osseous structures are unremarkable. IMPRESSION: No active cardiopulmonary disease. Electronically Signed   By: Kathreen Devoid   On: 07/26/2018 17:49    Procedures Procedures (including critical care time)  Medications Ordered in ED Medications  sodium chloride flush (NS) 0.9 % injection 3 mL (3 mLs Intravenous Given 07/26/18 1845)     Initial Impression / Assessment and Plan / ED Course  I have reviewed the triage vital signs and the nursing notes.  Pertinent labs & imaging results that were available during my care of the patient were reviewed by me and considered in my medical decision making (see chart for details).  Iv ns. Labs sent. Cxr. ecg .  Reviewed nursing notes and prior charts for additional history.   Patient currently chest pain free.  Labs reviewed by me - initial trop normal.  CXR reviewed by me - no pna.  Given new chest pain, hx  cad/stent, will admit for serial trop, possible cardiology consult in AM.  Medical service consulted for admission.     Final Clinical Impressions(s) / ED Diagnoses   Final diagnoses:  None    ED Discharge Orders    None       Lajean Saver, MD 07/26/18 2000

## 2018-07-26 NOTE — Telephone Encounter (Signed)
New Message:    Pt says he needs to have an appt with Dr Martinique asap please. He says his heart rate been as high 120 and 118 a few minutes ago. Some tightness in chest yesterday.

## 2018-07-26 NOTE — Telephone Encounter (Signed)
   Patient reports two episodes of chest pain over the last 48 hours. The initial episode started while he was doing yard work and resolved with rest. Second episode occurred at rest and resolved within 15 minutes without intervention. He reports feeling fatigued since onset of chest pain. He has been monitoring his vitals at home - BP stable in the 110s/60s, however he reports that his HR has been intermittently elevated to the 130s outside of physical activity. He does not appreciate any palpitations or racing heart beat sensation. He reports that the left sided chest pain he experienced was reminicent of his prior heart attacks (last in 2016 with in-stent restenosis of his LAD). Symptoms concerning for unstable angina. He was instructed to present to the ED for further evaluation. Patient in agreement with the plan. Will route to Dr. Martinique.  Abigail Butts, PA-C 07/26/18, 4:31 PM

## 2018-07-26 NOTE — Telephone Encounter (Signed)
   Left message for patient to call back. If I do not hear from him in the next 30 minutes, I will attempt to call him again.   Abigail Butts, PA-C 07/26/18, 3:22 PM

## 2018-07-26 NOTE — Telephone Encounter (Signed)
Follow up  ° ° °Pt is returning call  ° ° °Please call back  °

## 2018-07-26 NOTE — H&P (Signed)
Date: 07/26/2018               Patient Name:  Kevin Hart MRN: [id number]  DOB: 10-01-1945 Age / Sex: 73 y.o., male   PCP: Guadlupe Spanish, MD         Medical Service: Internal Medicine Teaching Service         Attending Physician: Dr. Bartholomew Crews, MD    First Contact: Dr. Donne Hazel Pager: [phone]  Second Contact: Dr. 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  He reports 3 episodes of nonradiating left-sided chest pressure/pain over the last 3 days.  Last 1 occurring last night when he was sitting down in his chair.  He is unsure what he was doing during the other 2 episodes but does report that he has been doing a lot of yard work.  The chest pain does appear to have have occurred during exertion and with rest and last for about 25 to 40 minutes.  He does not believe that the chest pain changes with movement or eating.  He is prescribed nitroglycerin as needed but did not take this medication.  He does note having an episode of shortness of breath while walking 2 days ago.  He has been monitoring his vital signs at home and noted normal blood pressures with low SBP between 80-115 but elevated heart rate into the 130s (per wife's cardiac device).  He does believe that the chest pain that he has experienced is similar to his previous. He called his cardiologist's office who recommended that he come to  the ED.  He reports associated occasional lightheadedness.  He denies nausea, vomiting, diaphoresis, cough, congestion, blurry vision.  He states that his usual weight is 220 pounds but does not believe that he has gained any weight recently.  His wife did suggest that he only take half of his Lasix tablet this morning prior to admission.    Followed by Dr. Martinique (cardiology) and Dr. Rayann Heman (EP).  In the ED, he is found to have normal vital signs.  PT/INR elevated to 34/3.5.  BMP significant for mild hypokalemia with potassium of 3.4.  Elevated creatinine to 1.21 (it does appear to be at baseline from 1-1.25).  CBC unremarkable.  Troponins negative x1.  Meds:  No current facility-administered medications on file prior to encounter.    Current Outpatient Medications on File Prior to Encounter  Medication Sig  . allopurinol (ZYLOPRIM) 300 MG tablet Take 300 mg by mouth daily.   . furosemide (LASIX) 40 MG tablet Take 40 mg by mouth daily.   . Multiple Vitamin (MULTI-VITAMINS) TABS Take 1 tablet by mouth daily.   . nitroGLYCERIN (NITROSTAT) 0.4 MG SL tablet Place 1 tablet (0.4 mg total) under the tongue every 5 (five) minutes x 3 doses as needed for chest pain.  . pantoprazole (PROTONIX) 40 MG tablet Take 1 tablet (  40 mg total) by mouth daily.  . rosuvastatin (CRESTOR) 20 MG tablet Take 1 tablet (20 mg total) by mouth daily.  . sacubitril-valsartan (ENTRESTO) 24-26 MG TAKE ONE TABLET BY MOUTH TWICE DAILY -TO REPLACE LISINOPRIL (START 36HOURS AFTER LAST DOSE) (Patient taking differently: Take 1 tablet by mouth 2 (two) times daily. TAKE ONE TABLET BY MOUTH TWICE DAILY -TO REPLACE LISINOPRIL (START 36HOURS AFTER LAST DOSE))  . warfarin (COUMADIN) 5 MG tablet Take 0.5-1 tablets (2.5-5 mg total) by mouth See admin instructions. Take 5 mg by mouth on Tuesday, Wednesday, Thursday, Saturday and Sunday then take 2.5 mg by mouth on Monday and Friday (Patient taking differently: Take 5 mg by mouth See admin  instructions. Take 5 mg by mouth on Tuesday, Thursday, Saturday and Sunday)    Allergies: Allergies as of 07/26/2018  . (No Known Allergies)   Past Medical History:  Diagnosis Date  . Arthritis   . Bladder stone 10/26/2017  . BPH (benign prostatic hyperplasia)    takes Flomax daily  . CAD (coronary artery disease)    a. prior LAD stenting;  b. 06/2014 NSTEMI/PCI: LM nl, LAD 30p, 7m ISR (3.0x20 Promus DES), LCX nl, RCA nondom, nl, EF 25-30% ant, apical, dist inf [place].  . Cataracts, bilateral    immature  . Chronic systolic CHF (congestive heart failure) ([place]) 12/31/2014  . DVT (deep venous thrombosis) ([facility])    RECURRENT left leg  . Dyslipidemia   . GERD (gastroesophageal reflux disease)   . Gout   . History of kidney stones   . Hypertension   . Ischemic cardiomyopathy 07/06/2014  . Lupus anticoagulant positive   . Myocardial infarct ([place]) 2000  . Myocardial infarct ([place]) 2016  . S/P drug eluting coronary stent placement, 07/06/14, Promus 07/07/2014  . Sleep apnea    biPaP  . Subarachnoid bleed ([place]) 2015   after falling and hitting his head    Family History:  Family History  Problem Relation Age of Onset  . Aneurysm Father     Social History: Currently lives in [place] with his third wife. He is retired, previously used to work as a [employer]. No current smoking, smoked 1-2 packs in college.  Reports rare alcohol use and denies any illicit drug use.  Review of Systems: A complete ROS was negative except as per HPI.   Physical Exam: Blood pressure 113/70, pulse 80, temperature 98 F (36.7 C), temperature source Oral, resp. rate 20, height 6' (1.829 m), weight 99.8 kg, SpO2 100 %. General: Sitting up in bed in no acute distress HEENT: Normocephalic and atraumatic, EOMI Cardiovascular: Normal rate, regular rhythm, JVD absent Respiratory: Clear to auscultation bilaterally, normal work of breathing, normal oxygen saturations on room air Abdominal: Nontender  to palpation, soft MSK: No lower extremity edema, erythema, or warmth noted bilaterally, no chest wall tenderness Skin: Warm and soft Psych: Normal affect, mood, behavior  EKG: personally reviewed my interpretation is sinus rhythm with first-degree AV block, no ST elevations or depressions, no T wave inversions  CXR: personally reviewed my interpretation is hyperinflated lungs, clear lung fields, normal heart size, no bony abnormalities, cardiac pacemaker in place.  Assessment & Plan by Problem: Active Problems:   Unstable angina [facility])  Mr. 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  Differential includes unstable angina vs ACS.  Heart score 7.   Pulmonary embolism should also be considered  but he has a very low pretest probability and is more likely to have a cardiac source for his chest pain.  Geneva score was 7 (due to previous age, DVT history, and heart rate in the 30s) which correlates with a moderate risk for a PE.  Will hold off on additional testing and consider a d-dimer if he develops tachycardia, hypoxia, or other symptoms suggestive of a PE. Additionally symptoms are less likely to be due to gastric reflux (unchanged with eating) versus a musculoskeletal source (no chest wall tenderness).   Unstable angina:  - Trend troponins - Continuous cardiac monitoring - Nitroglycerin as needed - Continue home rosuvastatin - Will likely consult cardiology in the morning  Chronic systolic heart failure: He does appear euvolemic on exam. - Continue home Lasix 40 mg daily - Intake and output - Daily weights  Hypertension: Blood pressures within acceptable limits. -Continue home Entresto BID  GERD: Stable.  Less likely to be the cause of his chest pain. 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  Signed: Carroll Sage, MD 07/26/2018, 8:13 PM  [phone]

## 2018-07-26 NOTE — ED Notes (Signed)
ED TO INPATIENT HANDOFF REPORT  ED Nurse Name and Phone #: Jennings Stirling [phone]  S Name/Age/Gender Kevin Hart 73 y.o. male Room/Bed: 026C/026C  Code Status   Code Status: Full Code  Home/SNF/Other Home Patient oriented to: self, place, time and situation Is this baseline? Yes   Triage Complete: Triage complete  Chief Complaint Chest Pain   Triage Note Pt states he started having CP a few days ago and the pain has been intermittent. Pt reports episodes of feeling like his heart was racing, and low BP 90 systolic. Pt has hx of MI. Denies N/V.    Allergies No Known Allergies  Level of Care/Admitting Diagnosis ED Disposition    ED Disposition Condition [facility]: [place] [[id number]]  Level of Care: Telemetry Cardiac [103]  Covid Evaluation: [name]  Diagnosis: Unstable angina [facility]) [[id number]]  Admitting Physician: Aline Brochure  Attending Physician: Bartholomew Crews [phone]  Estimated length of stay: past midnight tomorrow  Certification:: I certify this patient will need inpatient services for at least 2 midnights  PT Class (Do Not Modify): Inpatient [101]  PT Acc Code (Do Not Modify): Private [1]       B Medical/Surgery History Past Medical History:  Diagnosis Date  . Arthritis   . Bladder stone 10/26/2017  . BPH (benign prostatic hyperplasia)    takes Flomax daily  . CAD (coronary artery disease)    a. prior LAD stenting;  b. 06/2014 NSTEMI/PCI: LM nl, LAD 30p, 78m ISR (3.0x20 Promus DES), LCX nl, RCA nondom, nl, EF 25-30% ant, apical, dist inf [place].  . Cataracts, bilateral    immature  . Chronic systolic CHF (congestive heart failure) ([place]) 12/31/2014  . DVT (deep venous thrombosis) ([facility])    RECURRENT left leg  . Dyslipidemia   . GERD (gastroesophageal reflux disease)   . Gout   . History of kidney stones   . Hypertension   . Ischemic cardiomyopathy 07/06/2014  . Lupus anticoagulant positive   .  Myocardial infarct ([place]) 2000  . Myocardial infarct ([place]) 2016  . S/P drug eluting coronary stent placement, 07/06/14, Promus 07/07/2014  . Sleep apnea    biPaP  . Subarachnoid bleed ([place]) 2015   after falling and hitting his head   Past Surgical History:  Procedure Laterality Date  . COLONOSCOPY    . EP IMPLANTABLE DEVICE [name] 11/27/2014   [employer] Fortify Assura VR  implanted by Dr Rayann Heman for primary prevention  . LEFT HEART CATHETERIZATION WITH CORONARY ANGIOGRAM [name] 07/06/2014   Procedure: LEFT HEART CATHETERIZATION WITH CORONARY ANGIOGRAM;  Surgeon: Peter M Martinique, MD;  Location: [facility] CATH LAB;  Service: Cardiovascular;  Laterality: [name];  . PERCUTANEOUS CORONARY STENT INTERVENTION (PCI-S) Right 07/06/2014   Procedure: PERCUTANEOUS CORONARY STENT INTERVENTION (PCI-S);  Surgeon: Peter M Martinique, MD;  Location: [facility] CATH LAB;  Service: Cardiovascular;  Laterality: Right;  . SHOULDER ARTHROSCOPY WITH SUBACROMIAL DECOMPRESSION Left 03/05/2016   Procedure: SHOULDER ARTHROSCOPY ROTATOR CUFF REPAIR WITH SUBACROMIAL DECOMPRESSION;  Surgeon: Tania Ade, MD;  Location: [place];  Service: Orthopedics;  Laterality: Left;  SHOULDER ARTHROSCOPY ROTATOR CUFF REPAIR WITH SUBACROMIAL DECOMPRESSION  . SHOULDER CLOSED REDUCTION Left 11/29/2015   Procedure: CLOSED REDUCTION SHOULDER;  Surgeon: Tania Ade, MD;  Location: [place];  Service: Orthopedics;  Laterality: Left;  . TRANSURETHRAL RESECTION OF PROSTATE [name] 02/24/2018   Procedure: TRANSURETHRAL RESECTION OF THE PROSTATE (TURP);  Surgeon: Irine Seal, MD;  Location: [facility];  Service:  Urology;  Laterality: [name];  . WRIST SURGERY Bilateral    plates and screws      A IV Location/Drains/Wounds Patient Lines/Drains/Airways Status   Active Line/Drains/Airways    Name:   Placement date:   Placement time:   Site:   Days:   Peripheral IV 07/26/18 Left Antecubital   07/26/18    1845    Antecubital   less than 1          Intake/Output Last 24 hours No  intake or output data in the 24 hours ending 07/26/18 2031  Labs/Imaging Results for orders placed or performed during the hospital encounter of 07/26/18 (from the past 48 hour(s))  Basic metabolic panel     Status: Abnormal   Collection Time: 07/26/18  5:24 PM  Result Value Ref Range   Sodium 140 135 - 145 mmol/L   Potassium 3.4 (L) 3.5 - 5.1 mmol/L   Chloride 107 98 - 111 mmol/L   CO2 23 22 - 32 mmol/L   Glucose, Bld 117 (H) 70 - 99 mg/dL   BUN 18 8 - 23 mg/dL   Creatinine, Ser 1.21 0.61 - 1.24 mg/dL   Calcium 9.3 8.9 - 10.3 mg/dL   GFR calc non Af Amer 59 (L) >60 mL/min   GFR calc Af Amer >60 >60 mL/min   Anion gap 10 5 - 15    Comment: Performed at [facility], [place] [address], [place], [place] [address]  CBC     Status: None   Collection Time: 07/26/18  5:24 PM  Result Value Ref Range   WBC 6.3 4.0 - 10.5 K/uL   RBC 4.82 4.22 - 5.81 MIL/uL   Hemoglobin 14.6 13.0 - 17.0 g/dL   HCT 42.7 39.0 - 52.0 %   MCV 88.6 80.0 - 100.0 fL   MCH 30.3 26.0 - 34.0 pg   MCHC 34.2 30.0 - 36.0 g/dL   RDW 14.9 11.5 - 15.5 %   Platelets 185 150 - 400 K/uL   nRBC 0.0 0.0 - 0.2 %    Comment: Performed at [facility], [place] [address] [place], [place] [address]  Troponin I - ONCE - STAT     Status: None   Collection Time: 07/26/18  5:24 PM  Result Value Ref Range   Troponin I <0.03 <0.03 ng/mL    Comment: Performed at [facility], [place] [address], [place], [place] [address]  Protime-INR     Status: Abnormal   Collection Time: 07/26/18  7:34 PM  Result Value Ref Range   Prothrombin Time 34.3 (H) 11.4 - 15.2 seconds   INR 3.5 (H) 0.8 - 1.2    Comment: (NOTE) INR goal varies based on device and disease states. Performed at [facility], [place] [address], [place], [place] [address]    Dg Chest 2 View  Result Date: 07/26/2018 CLINICAL DATA:  Chest pain EXAM: CHEST - 2 VIEW COMPARISON:  10/26/2017 FINDINGS: The lungs are hyperinflated likely secondary to COPD. There is  no focal consolidation. There is no pleural effusion or pneumothorax. The heart and mediastinal contours are unremarkable. There is a single lead cardiac pacemaker. The osseous structures are unremarkable. IMPRESSION: No active cardiopulmonary disease. Electronically Signed   By: Kathreen Devoid   On: 07/26/2018 17:49    Pending Labs Unresulted Labs (From admission, onward)    Start     Ordered   07/27/18 0973  Basic metabolic panel  Tomorrow morning,  R     07/26/18 2026   07/27/18 0500  CBC  Tomorrow morning,   R     07/26/18 2026          Vitals/Pain Today's Vitals   07/26/18 1847 07/26/18 1900 07/26/18 1945 07/26/18 2015  BP: 114/77 115/64 113/70 108/74  Pulse: 77 79 80 70  Resp: 16 16 20 18   Temp:      TempSrc:      SpO2: 100% 99% 100% 98%  Weight:      Height:      PainSc:        Isolation Precautions No active isolations  Medications Medications  heparin injection 5,000 Units (has no administration in time range)  acetaminophen (TYLENOL) tablet 650 mg (has no administration in time range)    Or  acetaminophen (TYLENOL) suppository 650 mg (has no administration in time range)  senna-docusate (Senokot-S) tablet 1 tablet (has no administration in time range)  promethazine (PHENERGAN) tablet 12.5 mg (has no administration in time range)  Multi-Vitamins TABS 1 tablet (has no administration in time range)  allopurinol (ZYLOPRIM) tablet 300 mg (has no administration in time range)  nitroGLYCERIN (NITROSTAT) SL tablet 0.4 mg (has no administration in time range)  pantoprazole (PROTONIX) EC tablet 40 mg (has no administration in time range)  rosuvastatin (CRESTOR) tablet 20 mg (has no administration in time range)  sodium chloride flush (NS) 0.9 % injection 3 mL (3 mLs Intravenous Given 07/26/18 1845)    Mobility walks Low fall risk   Focused Assessments Cardiac Assessment Handoff:  Cardiac Rhythm: Normal sinus rhythm Lab Results  Component Value Date   TROPONINI  <0.03 07/26/2018   No results found for: DDIMER Does the Patient currently have chest pain? No     R Recommendations: See Admitting Provider Note  Report given to:   Additional Notes:

## 2018-07-26 NOTE — Telephone Encounter (Signed)
Returned call to patient he stated for the past 4 to 5 days he has had a fast heart beat 118 to 120.B/P has been low systolic 80 to 500. Stated he decreased lasix to 1/2 tablet daily.Stated for the past 2 days he has had chest tightness each episode lasting  15 to 20 mins.No chest tightness at present.Advised Dr.Jordan out of office this week.I will send message to Roby Lofts PA for advice.

## 2018-07-27 ENCOUNTER — Other Ambulatory Visit: Payer: Self-pay

## 2018-07-27 DIAGNOSIS — I252 Old myocardial infarction: Secondary | ICD-10-CM

## 2018-07-27 DIAGNOSIS — I255 Ischemic cardiomyopathy: Secondary | ICD-10-CM

## 2018-07-27 DIAGNOSIS — Z9989 Dependence on other enabling machines and devices: Secondary | ICD-10-CM

## 2018-07-27 DIAGNOSIS — E876 Hypokalemia: Secondary | ICD-10-CM

## 2018-07-27 DIAGNOSIS — I440 Atrioventricular block, first degree: Secondary | ICD-10-CM

## 2018-07-27 DIAGNOSIS — Z79899 Other long term (current) drug therapy: Secondary | ICD-10-CM

## 2018-07-27 DIAGNOSIS — I110 Hypertensive heart disease with heart failure: Secondary | ICD-10-CM

## 2018-07-27 DIAGNOSIS — D6862 Lupus anticoagulant syndrome: Secondary | ICD-10-CM

## 2018-07-27 DIAGNOSIS — I5022 Chronic systolic (congestive) heart failure: Secondary | ICD-10-CM

## 2018-07-27 DIAGNOSIS — M109 Gout, unspecified: Secondary | ICD-10-CM

## 2018-07-27 DIAGNOSIS — Z87891 Personal history of nicotine dependence: Secondary | ICD-10-CM

## 2018-07-27 DIAGNOSIS — Z7982 Long term (current) use of aspirin: Secondary | ICD-10-CM

## 2018-07-27 DIAGNOSIS — Z955 Presence of coronary angioplasty implant and graft: Secondary | ICD-10-CM

## 2018-07-27 DIAGNOSIS — I25110 Atherosclerotic heart disease of native coronary artery with unstable angina pectoris: Principal | ICD-10-CM

## 2018-07-27 DIAGNOSIS — K219 Gastro-esophageal reflux disease without esophagitis: Secondary | ICD-10-CM

## 2018-07-27 DIAGNOSIS — Z7901 Long term (current) use of anticoagulants: Secondary | ICD-10-CM

## 2018-07-27 DIAGNOSIS — G4733 Obstructive sleep apnea (adult) (pediatric): Secondary | ICD-10-CM

## 2018-07-27 DIAGNOSIS — Z86718 Personal history of other venous thrombosis and embolism: Secondary | ICD-10-CM

## 2018-07-27 DIAGNOSIS — I200 Unstable angina: Secondary | ICD-10-CM

## 2018-07-27 DIAGNOSIS — Z95810 Presence of automatic (implantable) cardiac defibrillator: Secondary | ICD-10-CM

## 2018-07-27 LAB — CBC
HCT: 39.5 % (ref 39.0–52.0)
Hemoglobin: 13.4 g/dL (ref 13.0–17.0)
MCH: 30.2 pg (ref 26.0–34.0)
MCHC: 33.9 g/dL (ref 30.0–36.0)
MCV: 89.2 fL (ref 80.0–100.0)
Platelets: 168 10*3/uL (ref 150–400)
RBC: 4.43 MIL/uL (ref 4.22–5.81)
RDW: 14.9 % (ref 11.5–15.5)
WBC: 5.7 10*3/uL (ref 4.0–10.5)
nRBC: 0 % (ref 0.0–0.2)

## 2018-07-27 LAB — BASIC METABOLIC PANEL
Anion gap: 9 (ref 5–15)
BUN: 16 mg/dL (ref 8–23)
CO2: 24 mmol/L (ref 22–32)
Calcium: 9 mg/dL (ref 8.9–10.3)
Chloride: 109 mmol/L (ref 98–111)
Creatinine, Ser: 1.09 mg/dL (ref 0.61–1.24)
GFR calc Af Amer: >60 mL/min (ref >60)
GFR calc non Af Amer: >60 mL/min (ref >60)
Glucose, Bld: 101 mg/dL — ABNORMAL HIGH (ref 70–99)
Potassium: 3.5 mmol/L (ref 3.5–5.1)
Sodium: 142 mmol/L (ref 135–145)

## 2018-07-27 LAB — TROPONIN I
Troponin I: <0.03 ng/mL (ref <0.03)
Troponin I: <0.03 ng/mL (ref <0.03)
Troponin I: <0.03 ng/mL (ref <0.03)

## 2018-07-27 LAB — MAGNESIUM: Magnesium: 2 mg/dL (ref 1.7–2.4)

## 2018-07-27 LAB — PROTIME-INR
INR: 3.2 — ABNORMAL HIGH (ref 0.8–1.2)
Prothrombin Time: 32 seconds — ABNORMAL HIGH (ref 11.4–15.2)

## 2018-07-27 MED ORDER — NITROGLYCERIN 0.4 MG/HR TD PT24
0.4000 mg | MEDICATED_PATCH | Freq: Every day | TRANSDERMAL | Status: DC
  Administered 2018-07-28 – 2018-07-29 (×2): 0.4 mg via TRANSDERMAL
  Filled 2018-07-27 (×3): qty 1

## 2018-07-27 MED ORDER — ASPIRIN 81 MG PO CHEW
81.0000 mg | CHEWABLE_TABLET | Freq: Every day | ORAL | Status: DC
  Administered 2018-07-27 – 2018-07-29 (×3): 81 mg via ORAL
  Filled 2018-07-27 (×4): qty 1

## 2018-07-27 NOTE — Progress Notes (Signed)
   Subjective:  He states he had another episode of chest pain overnight, the pain is similar to when he had to get stents placed before. It lasted about 10 min, similar to the timing of the other episodes. This all started one week ago. The overnight pain occurred when he was just resting in bed. The pain hasn't worsened but just occurring once a day. No n/v, sweating, numbness or tingling. He has had some SOB after going for a walk. His wife had to come pick him up.  He cannot remember if he was previously on plavix and does not take asa at home.   Objective:  Vital signs in last 24 hours: Vitals:   07/26/18 2100 07/26/18 2157 07/26/18 2200 07/27/18 0510  BP: 116/88 140/86  103/66  Pulse: 81 77  73  Resp: 17     Temp:  98 F (36.7 C)  97.8 F (36.6 C)  TempSrc:    Oral  SpO2: 100% 100%  98%  Weight:   102.2 kg   Height:   6' (1.829 m)    Gen:  Laying in bed, NAD, pleasant Cardiac: RRR, no m/r/g  Assessment/Plan:  Active Problems:   Unstable angina [facility])   Mr. 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  Unstable angina: nonradiating left-sided chest pressure/pain  - Trend troponins - Continuous cardiac monitoring - Nitroglycerin as needed - Continue home rosuvastatin - Consulted cardiology: possible cath when INR is 2. Hold warfarin. Start heparin when INR is 2.     Chronic systolic heart failure: He does appear euvolemic on exam. - Continue home Lasix 40 mg daily - Intake and output - Daily weights  Hypertension: Blood pressures within acceptable limits. 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   Isabelle Course, MD 07/27/2018, 9:17 AM Pager: [phone]

## 2018-07-27 NOTE — Consult Note (Signed)
Cardiology Consultation:   Patient ID: Kevin Hart MRN: [id number]; DOB: 1945/09/04  Admit date: 07/26/2018 Date of Consult: 07/27/2018  Primary Care Provider: Guadlupe Spanish, MD Primary Cardiologist: Peter Martinique, MD  Primary Electrophysiologist:  Thompson Grayer, MD  Shelva Majestic, MD(sleep)  Patient Profile:   Kevin Hart is a 73 y.o. 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 Lynnae January.  He was admitted in April 2016 and underwent DES to his LAD for in-stent restenosis. Cardiac catheterization at that time showed 90% mid LAD stenosis withinold stent, EF 25-30%. Titration of medications was limited by orthostasis. Repeat echocardiogram in May 2017 showed EF 30-35%. He underwent ICD implantation by Dr. Rayann Heman 11/27/2014. He also has OSA.   When seen on 10/08/2016, he was having recurrent chest discomfort. Myoview obtained on 10/14/2016 showed EF 34%, no EKG changes, large defect of moderate severity present in the basal inferior, mid anterior, mid inferoseptal, mid inferior, apical anterior, apical septal and apical inferior location. Most consistent with previous scar however has very mild peri-infarct ischemia. Last ICD check on May 03, 2017 was normal. In April he was started on [place]. Titration of CHF medications has been limited by low BP. Repeat echocardiogram obtained on 08/18/2017 showed EF 35 to 40%, mild LVH, wall motion abnormality, mild AI, trivial MR.  He was admitted in August 2019 with right-sided abdominal pain. CT of abdomen and pelvis showed a large heterogeneous lesion arising at the interpolar region of the right kidney. He was diagnosed with right hemorrhagic renal cyst. Urology was consulted during the admission and  recommended restarting Coumadin after 1 week. Plavix was discontinued. He was seen by Almyra Deforest PA-C in August. Lipitor was switched to Crestor due to thigh pain. Given low BP eplerenone was discontinued. Pharmacy considering PCSK 9 inhibitor.   He was admitted 02/23/18 with bladder outlet obstruction due to BPH. TURP performed.   He was doing well when last seen by Dr. Martinique 02/2018.  History of Present Illness:   Mr. [name] presented for chest pain x 5-7 days. Initially symptoms started while doing yard work which resolved with rest. However then started to having resting pain with self resolution within 10-15 minutes. No radiation. Did not took SL nitrol. Exertional dyspnea. Also noted intermittent tachycardia with rate up to 130s with normal blood pressure. However, not typically noted palpitations. He has been fatigued since initial episode. Symptoms similar to prior angina when he had PCI in 2016. Called office and advised to go to ER.   PT/INR elevated to 34/3.5.  SCr 1.21. K 3.4. Admitted for r/o. He had recurrent chest pain overnight and cardiology is asked for further evaluation.  Troponin x 3 negative. PT/INR 32/3.2 this morning. K 1.09.  Past Medical History:  Diagnosis Date  . Arthritis   . Bladder stone 10/26/2017  . BPH (benign prostatic hyperplasia)    takes Flomax daily  . CAD (coronary artery disease)    a. prior LAD stenting;  b. 06/2014 NSTEMI/PCI: LM nl, LAD 30p, 23m ISR (3.0x20 Promus DES), LCX nl, RCA nondom, nl, EF 25-30% ant, apical, dist inf AK.  . Cataracts, bilateral    immature  . Chronic systolic CHF (congestive heart failure) ([place]) 12/31/2014  . DVT (deep venous thrombosis) ([place])  RECURRENT left leg  . Dyslipidemia   . GERD (gastroesophageal reflux disease)   . Gout   . History of kidney stones   . Hypertension   . Ischemic cardiomyopathy 07/06/2014  . Lupus anticoagulant positive   . Myocardial infarct ([place]) 2000  . Myocardial infarct ([place]) 2016   . S/P drug eluting coronary stent placement, 07/06/14, Promus 07/07/2014  . Sleep apnea    biPaP  . Subarachnoid bleed ([place]) 2015   after falling and hitting his head    Past Surgical History:  Procedure Laterality Date  . COLONOSCOPY    . EP IMPLANTABLE DEVICE [name] 11/27/2014   St. Jude Medical Fortify Assura VR  implanted by Dr Rayann Heman for primary prevention  . LEFT HEART CATHETERIZATION WITH CORONARY ANGIOGRAM [name] 07/06/2014   Procedure: LEFT HEART CATHETERIZATION WITH CORONARY ANGIOGRAM;  Surgeon: Peter M Martinique, MD;  Location: [facility] CATH LAB;  Service: Cardiovascular;  Laterality: [name];  . PERCUTANEOUS CORONARY STENT INTERVENTION (PCI-S) Right 07/06/2014   Procedure: PERCUTANEOUS CORONARY STENT INTERVENTION (PCI-S);  Surgeon: Peter M Martinique, MD;  Location: [facility] CATH LAB;  Service: Cardiovascular;  Laterality: Right;  . SHOULDER ARTHROSCOPY WITH SUBACROMIAL DECOMPRESSION Left 03/05/2016   Procedure: SHOULDER ARTHROSCOPY ROTATOR CUFF REPAIR WITH SUBACROMIAL DECOMPRESSION;  Surgeon: Tania Ade, MD;  Location: [facility];  Service: Orthopedics;  Laterality: Left;  SHOULDER ARTHROSCOPY ROTATOR CUFF REPAIR WITH SUBACROMIAL DECOMPRESSION  . SHOULDER CLOSED REDUCTION Left 11/29/2015   Procedure: CLOSED REDUCTION SHOULDER;  Surgeon: Tania Ade, MD;  Location: [facility];  Service: Orthopedics;  Laterality: Left;  . TRANSURETHRAL RESECTION OF PROSTATE [name] 02/24/2018   Procedure: TRANSURETHRAL RESECTION OF THE PROSTATE (TURP);  Surgeon: Irine Seal, MD;  Location: [facility] ORS;  Service: Urology;  Laterality: [name];  . WRIST SURGERY Bilateral    plates and screws      Home Medications:  Prior to Admission medications   Medication Sig Start Date End Date Taking? Authorizing Provider  allopurinol (ZYLOPRIM) 300 MG tablet Take 300 mg by mouth daily.  02/27/16  Yes [provider]  furosemide (LASIX) 40 MG tablet Take 40 mg by mouth daily.    Yes [provider]  Multiple Vitamin (MULTI-VITAMINS) TABS  Take 1 tablet by mouth daily.    Yes [provider]  nitroGLYCERIN (NITROSTAT) 0.4 MG SL tablet Place 1 tablet (0.4 mg total) under the tongue every 5 (five) minutes x 3 doses as needed for chest pain. 10/09/14  Yes Martinique, Peter M, MD  pantoprazole (PROTONIX) 40 MG tablet Take 1 tablet (40 mg total) by mouth daily. 07/07/14  Yes Lendon Colonel, NP  rosuvastatin (CRESTOR) 20 MG tablet Take 1 tablet (20 mg total) by mouth daily. 11/09/17 07/26/18 Yes Meng, Hao, PA  sacubitril-valsartan (ENTRESTO) 24-26 MG TAKE ONE TABLET BY MOUTH TWICE DAILY -TO REPLACE LISINOPRIL (START 36HOURS AFTER LAST DOSE) Patient taking differently: Take 1 tablet by mouth 2 (two) times daily. TAKE ONE TABLET BY MOUTH TWICE DAILY -TO REPLACE LISINOPRIL (START 36HOURS AFTER LAST DOSE) 11/29/17  Yes Martinique, Peter M, MD  warfarin (COUMADIN) 5 MG tablet Take 0.5-1 tablets (2.5-5 mg total) by mouth See admin instructions. Take 5 mg by mouth on Tuesday, Wednesday, Thursday, Saturday and Sunday then take 2.5 mg by mouth on Monday and Friday Patient taking differently: Take 5 mg by mouth See admin instructions. Take 5 mg everyday except Monday and Thursday. 11/01/17  Yes Mikhail, Velta Addison, DO    Inpatient Medications: Scheduled Meds: . allopurinol  300 mg Oral Daily  .  aspirin  81 mg Oral Daily  . furosemide  40 mg Oral Daily  . multivitamin with minerals  1 tablet Oral Daily  . pantoprazole  40 mg Oral Daily  . rosuvastatin  20 mg Oral Daily  . sacubitril-valsartan  1 tablet Oral BID   Continuous Infusions:  PRN Meds: acetaminophen **OR** acetaminophen, nitroGLYCERIN, promethazine, senna-docusate  Allergies:   No Known Allergies  Social History:   Social History   Socioeconomic History  . Marital status: Married    Spouse name: Not on file  . Number of children: 6  . Years of education: Not on file  . Highest education level: Not on file  Occupational History  . Not on file  Social Needs  . Financial  resource strain: Not on file  . Food insecurity:    Worry: Not on file    Inability: Not on file  . Transportation needs:    Medical: Not on file    Non-medical: Not on file  Tobacco Use  . Smoking status: Never Smoker  . Smokeless tobacco: Never Used  Substance and Sexual Activity  . Alcohol use: Yes    Alcohol/week: 0.0 standard drinks    Comment: seldom   . Drug use: No  . Sexual activity: Yes    Birth control/protection: None  Lifestyle  . Physical activity:    Days per week: Not on file    Minutes per session: Not on file  . Stress: Not on file  Relationships  . Social connections:    Talks on phone: Not on file    Gets together: Not on file    Attends religious service: Not on file    Active member of club or organization: Not on file    Attends meetings of clubs or organizations: Not on file    Relationship status: Not on file  . Intimate partner violence:    Fear of current or ex partner: Not on file    Emotionally abused: Not on file    Physically abused: Not on file    Forced sexual activity: Not on file  Other Topics Concern  . Not on file  Social History Narrative   Pt lives in [place] with spouse.  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    Family History:    Family History  Problem Relation Age of Onset  . Aneurysm Father      ROS:  Please see the history of present illness.   All other ROS reviewed and negative.     Physical Exam/Data:   Vitals:   07/26/18 2100 07/26/18 2157 07/26/18 2200 07/27/18 0510  BP: 116/88 140/86  103/66  Pulse: 81 77  73  Resp: 17     Temp:  98 F (36.7 C)  97.8 F (36.6 C)  TempSrc:    Oral  SpO2: 100% 100%  98%  Weight:   102.2 kg   Height:   6' (1.829 m)     Intake/Output Summary (Last 24 hours) at 07/27/2018 0846 Last data filed at 07/26/2018 2200 Gross per 24 hour  Intake 240 ml  Output -  Net 240 ml   Last 3 Weights 07/26/2018 07/26/2018 05/02/2018  Weight (lbs) 225 lb 3.2 oz 220 lb 226 lb 12.8  oz  Weight (kg) 102.15 kg 99.791 kg 102.876 kg     Body mass index is 30.54 kg/m.  General:  Well nourished, well developed, in no acute distress HEENT: normal Lymph: no adenopathy Neck: no JVD Endocrine:  No thryomegaly Vascular: No carotid bruits; FA pulses 2+ bilaterally without bruits  Cardiac:  normal S1, S2; RRR; no murmur  Lungs:  clear to auscultation bilaterally, no wheezing, rhonchi or rales  Abd: soft, nontender, no hepatomegaly  Ext: no edema Musculoskeletal:  No deformities, BUE and BLE strength normal and equal Skin: warm and dry  Neuro:  CNs 2-12 intact, no focal abnormalities noted Psych:  Normal affect   EKG:  The EKG was personally reviewed and demonstrates:  SR rate of 94 bpm, PR interval of 260ms Telemetry:  Telemetry was personally reviewed and demonstrates: Normal sinus rhythm with occasional PVCs and occasional demand pacing  Relevant CV Studies:  Echo 08/18/17: Study Conclusions  - Left ventricle: The cavity size was normal. Wall thickness was increased in a pattern of mild LVH. Systolic function was moderately reduced. The estimated ejection fraction was in the range of 35% to 40%. Anterior, anteroseptal, apical and inferoapical hypokinesis. Doppler parameters are consistent with abnormal left ventricular relaxation (grade 1 diastolic dysfunction). The E/e&' ratio is <8, suggesting normal LV filling pressure. - Aortic valve: Sclerosis without stenosis. There was mild regurgitation. - Mitral valve: Mildly thickened leaflets . There was trivial regurgitation. - Left atrium: The atrium was normal in size. - Right ventricle: The cavity size was mildly dilated. Pacer or AICD wire in right ventricle. Systolic function was normal. - Right atrium: The atrium was mildly dilated. Pacer or AICD wire noted in right atrium. - Tricuspid valve: There was trivial regurgitation. - Pulmonary arteries: PA peak pressure: 25 mm Hg (S). -  Inferior vena cava: The vessel was normal in size. The respirophasic diameter changes were in the normal range (= 50%), consistent with normal central venous pressure.  Impressions:  - Compared to a prior study in 07/2015, the LVEF has slightly improved to 35-40% with persistent LAD territory wall motion abnormalities.  Myoview 10/14/2016 Study Highlights    The left ventricular ejection fraction is moderately decreased (30-44%).  There was no ST segment deviation noted during stress.  There is a large defect of moderate severity present in the basal inferior, mid anterior, mid inferoseptal, mid inferior, apical anterior, apical septal, apical inferior, apical lateral and apex location. The defect is mostly fixed with a small area of reversibility in the inferoseptal region. This is consistent with a large area of scar and mild peri infarct ischemia in the inferoseptal wall.  This is a high risk study due to LV dysfunction and large area of scar with peri infarct ischemia in the inferoseptum.  Nuclear stress EF: 34%.     Laboratory Data:  Chemistry Recent Labs  Lab 07/26/18 1724 07/27/18 0627  NA 140 142  K 3.4* 3.5  CL 107 109  CO2 23 24  GLUCOSE 117* 101*  BUN 18 16  CREATININE 1.21 1.09  CALCIUM 9.3 9.0  GFRNONAA 59* >60  GFRAA >60 >60  ANIONGAP 10 9    No results for input(s): PROT, ALBUMIN, AST, ALT, ALKPHOS, BILITOT in the last 168 hours. Hematology Recent Labs  Lab 07/26/18 1724 07/27/18 0627  WBC 6.3 5.7  RBC 4.82 4.43  HGB 14.6 13.4  HCT 42.7 39.5  MCV 88.6 89.2  MCH 30.3 30.2  MCHC 34.2 33.9  RDW 14.9 14.9  PLT 185 168   Cardiac Enzymes Recent Labs  Lab 07/26/18 1724 07/26/18 2312 07/27/18 0627  TROPONINI <0.03 <0.03 <0.03   Radiology/Studies:  Dg Chest 2 View  Result Date: 07/26/2018 CLINICAL DATA:  Chest pain EXAM:  CHEST - 2 VIEW COMPARISON:  10/26/2017 FINDINGS: The lungs are hyperinflated likely secondary to COPD. There is  no focal consolidation. There is no pleural effusion or pneumothorax. The heart and mediastinal contours are unremarkable. There is a single lead cardiac pacemaker. The osseous structures are unremarkable. IMPRESSION: No active cardiopulmonary disease. Electronically Signed   By: Kathreen Devoid   On: 07/26/2018 17:49    Assessment and Plan:   1. Chest pain concerning for unstable angina - His symptoms is similar when he had PCI in 2016. No radiation but noted exertional dyspnea and fatigue since initial symptoms. Troponin negative so far. EKG without acute changes. Recurrent pain overnight.  - Needs ischemic evaluation however PT//INR supra therapeutic. If cath is planned this admission needs to start IV heparin when INR less than 2.  - Continue ASA and statin. Not on BB due soft blood pressure.  - May consider adding Imdur.   2.  Recurrent DVT with positive lupus anticoagulant - Continue long term [place] with Warfarin - PT//INR supra therapeutic 32/3.2 this morning.   3. Chronic systolic CHF - Weight has been stable. Scr normal. Continue lasix and Entresto. Unable to add BB due to soft blood pressure.   4. ICM s/p ICD - Normal functioning device on 06/27/2018. May consider device check if concerning for arrhthymias given recent tachycardia.   5. HLD - 12/16/2017: Cholesterol, Total 140; HDL 40; LDL Calculated 78; Triglycerides 111  - Continue statin.    For questions or updates, please contact [place] Please consult [email or web address] for contact info under     Jarrett Soho, PA  07/27/2018 8:46 AM   Agree with note by Robbie Lis PA-C  We are asked to see Mr. Goodrich by Dr. Lynnae January because of chest pain.  He is a patient of Dr. Doug Sou.  He has a history of ischemic cardiomyopathy with an EF in the 35% range status post ICD implantation for primary prevention.  He is on guideline directed therapy for chronic systolic heart failure.  So the problems include hyperlipidemia.   He has had chest pain for last week which is left precordial occurring randomly lasting for up to 10 minutes at a time.  He was admitted yesterday because of this.  His enzymes are negative.  EKG shows no acute changes.  He is in sinus rhythm today with a blood pressure in the low 100 range.  His exam otherwise is benign.  He does have a lupus anticoagulant and is on Coumadin anticoagulation with an INR 3.4.  I favor diagnostic coronary angiography to further evaluate the etiology of his chest pain.  We will need to hold his Coumadin, check daily INRs and start IV heparin when his INR is 2 and it is safe to proceed with invasive procedure.  I have reviewed the risks, indications, and alternatives to cardiac catheterization, possible angioplasty, and stenting with the patient. Risks include but are not limited to bleeding, infection, vascular injury, stroke, myocardial infection, arrhythmia, kidney injury, radiation-related injury in the case of prolonged fluoroscopy use, emergency cardiac surgery, and death. The patient understands the risks of serious complication is 1-2 in 9242 with diagnostic cardiac cath and 1-2% or less with angioplasty/stenting.   Lorretta Harp, M.D., [place], [facility], [place] [address] [place], [place]  [address]  [phone] 07/27/2018 10:14 AM

## 2018-07-27 NOTE — Progress Notes (Signed)
[place] for heparin Indication: DVT/lupus anticoagulant   Patient Measurements: Height: 6' (182.9 cm) Weight: 225 lb 3.2 oz (102.2 kg) IBW/kg (Calculated) : 77.6  Vital Signs: Temp: 97.8 F (36.6 C) (05/06 0510) Temp Source: Oral (05/06 0510) BP: 103/66 (05/06 0510) Pulse Rate: 73 (05/06 0510)  Labs: Recent Labs    07/26/18 1724 07/26/18 1934 07/26/18 2312 07/27/18 0627  HGB 14.6  --   --  13.4  HCT 42.7  --   --  39.5  PLT 185  --   --  168  LABPROT  --  34.3*  --  32.0*  INR  --  3.5*  --  3.2*  CREATININE 1.21  --   --  1.09  TROPONINI <0.03  --  <0.03 <0.03     Medical History: Past Medical History:  Diagnosis Date  . Arthritis   . Bladder stone 10/26/2017  . BPH (benign prostatic hyperplasia)    takes Flomax daily  . CAD (coronary artery disease)    a. prior LAD stenting;  b. 06/2014 NSTEMI/PCI: LM nl, LAD 30p, 83m ISR (3.0x20 Promus DES), LCX nl, RCA nondom, nl, EF 25-30% ant, apical, dist inf AK.  . Cataracts, bilateral    immature  . Chronic systolic CHF (congestive heart failure) ([place]) 12/31/2014  . DVT (deep venous thrombosis) ([facility])    RECURRENT left leg  . Dyslipidemia   . GERD (gastroesophageal reflux disease)   . Gout   . History of kidney stones   . Hypertension   . Ischemic cardiomyopathy 07/06/2014  . Lupus anticoagulant positive   . Myocardial infarct ([place]) 2000  . Myocardial infarct ([place]) 2016  . S/P drug eluting coronary stent placement, 07/06/14, Promus 07/07/2014  . Sleep apnea    biPaP  . Subarachnoid bleed ([place]) 2015   after falling and hitting his head     Assessment: 73 yoM on warfarin PTA for hx recurrent DVTs and hx of lupus anticoagulant admitted with CP. Pharmacy asked to continue warfarin. INR elevated on admit at 3.50 - last dose of warfarin was 5/3. CBC stable, troponins negative.  Planning to transition to heparin in anticipation of cardiac cath.   Goal of Therapy:  INR  2-3 Monitor platelets by anticoagulation protocol: Yes    Plan:  -Initiate heparin when INR < 2 -Would begin with heparin 1200 units/hr   Harvel Quale 07/27/2018 10:30 AM

## 2018-07-28 LAB — PROTIME-INR
INR: 2.6 — ABNORMAL HIGH (ref 0.8–1.2)
Prothrombin Time: 27.2 seconds — ABNORMAL HIGH (ref 11.4–15.2)

## 2018-07-28 MED ORDER — SODIUM CHLORIDE 0.9 % WEIGHT BASED INFUSION
3.0000 mL/kg/h | INTRAVENOUS | Status: AC
  Administered 2018-07-29: 3 mL/kg/h via INTRAVENOUS

## 2018-07-28 MED ORDER — SODIUM CHLORIDE 0.9 % IV SOLN: 250.0000 mL | INTRAVENOUS | Status: DC | PRN

## 2018-07-28 MED ORDER — SODIUM CHLORIDE 0.9 % WEIGHT BASED INFUSION
1.0000 mL/kg/h | INTRAVENOUS | Status: DC
  Administered 2018-07-29: 1 mL/kg/h via INTRAVENOUS

## 2018-07-28 MED ORDER — SODIUM CHLORIDE 0.9% FLUSH: 3.0000 mL | INTRAVENOUS | Status: DC | PRN

## 2018-07-28 MED ORDER — SODIUM CHLORIDE 0.9% FLUSH
3.0000 mL | Freq: Two times a day (BID) | INTRAVENOUS | Status: DC
  Administered 2018-07-29: 3 mL via INTRAVENOUS

## 2018-07-28 NOTE — Progress Notes (Signed)
Progress Note  Patient Name: Kevin Hart Date of Encounter: 07/28/2018  Primary Cardiologist: Peter Martinique, MD   Subjective   No chest pain this morning.  Complaining of headache from topical nitrates.  Inpatient Medications    Scheduled Meds: . allopurinol  300 mg Oral Daily  . aspirin  81 mg Oral Daily  . furosemide  40 mg Oral Daily  . multivitamin with minerals  1 tablet Oral Daily  . nitroGLYCERIN  0.4 mg Transdermal Daily  . pantoprazole  40 mg Oral Daily  . rosuvastatin  20 mg Oral Daily  . sacubitril-valsartan  1 tablet Oral BID   Continuous Infusions:  PRN Meds: acetaminophen **OR** acetaminophen, nitroGLYCERIN, promethazine, senna-docusate   Vital Signs    Vitals:   07/27/18 0510 07/27/18 1140 07/27/18 2051 07/28/18 0624  BP: 103/66 (!) 109/57 106/71 109/76  Pulse: 73 84 72 79  Resp:  20    Temp: 97.8 F (36.6 C) 98.1 F (36.7 C) 98 F (36.7 C) 98.2 F (36.8 C)  TempSrc: Oral Oral Oral Oral  SpO2: 98% 98% 99% 96%  Weight:    101.1 kg  Height:        Intake/Output Summary (Last 24 hours) at 07/28/2018 0817 Last data filed at 07/27/2018 1700 Gross per 24 hour  Intake 720 ml  Output 400 ml  Net 320 ml   Last 3 Weights 07/28/2018 07/26/2018 07/26/2018  Weight (lbs) 222 lb 12.8 oz 225 lb 3.2 oz 220 lb  Weight (kg) 101.061 kg 102.15 kg 99.791 kg      Telemetry    Normal sinus rhythm- Personally Reviewed  ECG    Not performed today- Personally Reviewed  Physical Exam   GEN: No acute distress.   Neck: No JVD Cardiac: RRR, no murmurs, rubs, or gallops.  Respiratory: Clear to auscultation bilaterally. GI: Soft, nontender, non-distended  MS: No edema; No deformity. Neuro:  Nonfocal  Psych: Normal affect   Labs    Chemistry Recent Labs  Lab 07/26/18 1724 07/27/18 0627  NA 140 142  K 3.4* 3.5  CL 107 109  CO2 23 24  GLUCOSE 117* 101*  BUN 18 16  CREATININE 1.21 1.09  CALCIUM 9.3 9.0  GFRNONAA 59* >60  GFRAA >60 >60  ANIONGAP 10 9      Hematology Recent Labs  Lab 07/26/18 1724 07/27/18 0627  WBC 6.3 5.7  RBC 4.82 4.43  HGB 14.6 13.4  HCT 42.7 39.5  MCV 88.6 89.2  MCH 30.3 30.2  MCHC 34.2 33.9  RDW 14.9 14.9  PLT 185 168    Cardiac Enzymes Recent Labs  Lab 07/26/18 1724 07/26/18 2312 07/27/18 0627 07/27/18 1154  TROPONINI <0.03 <0.03 <0.03 <0.03   No results for input(s): TROPIPOC in the last 168 hours.   BNPNo results for input(s): BNP, PROBNP in the last 168 hours.   DDimer No results for input(s): DDIMER in the last 168 hours.   Radiology    Dg Chest 2 View  Result Date: 07/26/2018 CLINICAL DATA:  Chest pain EXAM: CHEST - 2 VIEW COMPARISON:  10/26/2017 FINDINGS: The lungs are hyperinflated likely secondary to COPD. There is no focal consolidation. There is no pleural effusion or pneumothorax. The heart and mediastinal contours are unremarkable. There is a single lead cardiac pacemaker. The osseous structures are unremarkable. IMPRESSION: No active cardiopulmonary disease. Electronically Signed   By: Kathreen Devoid   On: 07/26/2018 17:49    Cardiac Studies   [name]   Patient Profile  73 y.o. 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  Assessment & Plan    1. Chest pain concerning for unstable angina: symptoms are similar when he had PCI in 2016. Troponins negative. Plan for cardiac cath. Following INR daily.  - IV heparin once INR < 2 - Continue ASA and statin. Not on BB due soft blood pressure.  - May consider adding Imdur if recurrent chest pain.  2. Recurrent DVT with positive lupus anticoagulant: - coumadin held, INR 2.6 today  3. Chronic systolic CHF: - Weight has been stable. Scr normal. Continue lasix and Entresto. Unable to add BB due to soft blood pressure.   4. ICM s/p  ICD: - Normal functioning device on 06/27/2018.   5. HLD: - 12/16/2017: Cholesterol, Total 140; HDL 40; LDL Calculated 78; Triglycerides 111  - Continue statin.   For questions or updates, please contact [place] Please consult [email or web address] for contact info under        Signed, Reino Bellis, NP  07/28/2018, 8:17 AM    Agree with note by Reino Bellis NP-C  Mr. Coviello has remained stable.  He has topical nitrates on complains of headache but denies chest pain.  His INR is 2.6 this morning awaiting diagnostic coronary angiography tomorrow if his INR falls below 2.  His exam is benign.  Lorretta Harp, M.D., [place], [facility], [place] [address] [place], [place]  [address]  [phone] 07/28/2018 9:15 AM

## 2018-07-28 NOTE — Plan of Care (Signed)
  Problem: Clinical Measurements: Goal: Respiratory complications will improve Outcome: Progressing Note:  No s/s of respiratory complications noted.  Stable on room air.   Problem: Pain Managment: Goal: General experience of comfort will improve Outcome: Progressing Note:  Pain controlled with po pain meds.  Tylenol effective for headache.

## 2018-07-28 NOTE — Progress Notes (Signed)
  Date: 07/28/2018  Patient name: Kevin Hart  Medical record number: [id number]  Date of birth: 06-Dec-1945   I have seen and evaluated this patient and I have discussed the plan of care with the house staff. Please see their note for complete details. I concur with their findings with the following additions/corrections: Mr. Taddei was seen this morning on team rounds.  He has had no subsequent chest pain but also had topical nitroglycerin placed yesterday morning.  He subsequently had a headache.  He is on the schedule for catheterization tomorrow but his INR will need to be less than two-point to be completed.  Bartholomew Crews, MD 07/28/2018, 11:33 AM

## 2018-07-28 NOTE — Plan of Care (Signed)
  Problem: Clinical Measurements: Goal: Cardiovascular complication will be avoided Outcome: Progressing Note:  No s/s of cardiovascular complications noted.  Denies CP or SOB.  VSS.  NSR on telemetry.

## 2018-07-28 NOTE — Progress Notes (Signed)
   Subjective:  Feeling well. Nitro patch was placed yesterday. Denies any additional episodes of chest pain since yesterday morning. He does endorse h/a from nitro patch. Discussed the INR is still >2 so no cath today. Will check INR again tomorrow. Advised him to be npo at [facility] again just in case they are able to cath him tomorrow.   Objective:  Vital signs in last 24 hours: Vitals:   07/27/18 0510 07/27/18 1140 07/27/18 2051 07/28/18 0624  BP: 103/66 (!) 109/57 106/71 109/76  Pulse: 73 84 72 79  Resp:  20    Temp: 97.8 F (36.6 C) 98.1 F (36.7 C) 98 F (36.7 C) 98.2 F (36.8 C)  TempSrc: Oral Oral Oral Oral  SpO2: 98% 98% 99% 96%  Weight:    101.1 kg  Height:       Gen: laying in bed, pleasant, NAD Cardiac: RRR, no m/r/g, nitro patch on right anterior chest  Assessment/Plan:  Active Problems:   Unstable angina [facility])   Mr. 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  Unstable angina: Trops neg x 3, denies further chest pain. Nitro patch placed 5/6. INR 2.6 - tele  - asa 81mg  qd - nitro SL prn  - continue home rosuvastatin - qd INR, hold warfarin  - f/u cardiology's recs     Chronic systolic heart failure: stable, euvolemic  - Continue home Lasix 40 mg daily - Intake and output - Daily weights  Hypertension: well controlled  -Continue home Entresto BID  History recurrent DVT on longterm OAC: Stable Hold home warfarin  FEN/GI: Heart healthy DVT prophylaxis: holding warfarin until INR<2, then will start heparin  CODE STATUS: Full  Dispo: Anticipated discharge in approximately  2-3 day(s).   Isabelle Course, MD 07/28/2018, 7:08 AM Pager: [phone]

## 2018-07-28 NOTE — Progress Notes (Signed)
[place] for heparin Indication: DVT/lupus anticoagulant   Patient Measurements: Height: 6' (182.9 cm) Weight: 222 lb 12.8 oz (101.1 kg) IBW/kg (Calculated) : 77.6  Vital Signs: Temp: 98.2 F (36.8 C) (05/07 0624) Temp Source: Oral (05/07 0624) BP: 109/76 (05/07 0624) Pulse Rate: 79 (05/07 0624)  Labs: Recent Labs    07/26/18 1724 07/26/18 1934 07/26/18 2312 07/27/18 0627 07/27/18 1154 07/28/18 0338  HGB 14.6  --   --  13.4  --   --   HCT 42.7  --   --  39.5  --   --   PLT 185  --   --  168  --   --   LABPROT  --  34.3*  --  32.0*  --  27.2*  INR  --  3.5*  --  3.2*  --  2.6*  CREATININE 1.21  --   --  1.09  --   --   TROPONINI <0.03  --  <0.03 <0.03 <0.03  --    Assessment: 58 yoM on warfarin PTA for hx recurrent DVTs and hx of lupus anticoagulant admitted with CP.   INR elevated on admit at 3.50 - last dose of warfarin was 5/3. CBC stable, troponins negative.  Planning to transition to heparin in anticipation of cardiac cath when INR < 2 This am INR 2.6  Goal of Therapy:  INR 2-3 Monitor platelets by anticoagulation protocol: Yes  Plan:  -Initiate heparin when INR < 2 -F/U plans for cath  Levester Fresh, PharmD, BCPS, BCCCP Clinical Pharmacist [phone]  Please check AMION for all [place] numbers  07/28/2018 8:49 AM

## 2018-07-29 ENCOUNTER — Encounter (HOSPITAL_COMMUNITY): Payer: Self-pay | Admitting: Cardiovascular Disease

## 2018-07-29 ENCOUNTER — Encounter (HOSPITAL_COMMUNITY)
Admission: EM | Disposition: A | Discharge status: Home / Self Care | Payer: PPO | Source: Home / Self Care | Attending: Internal Medicine

## 2018-07-29 DIAGNOSIS — I2510 Atherosclerotic heart disease of native coronary artery without angina pectoris: Secondary | ICD-10-CM

## 2018-07-29 DIAGNOSIS — R072 Precordial pain: Secondary | ICD-10-CM

## 2018-07-29 DIAGNOSIS — E785 Hyperlipidemia, unspecified: Secondary | ICD-10-CM

## 2018-07-29 HISTORY — PX: LEFT HEART CATH AND CORONARY ANGIOGRAPHY: CATH118249

## 2018-07-29 LAB — BASIC METABOLIC PANEL
Anion gap: 7 (ref 5–15)
BUN: 13 mg/dL (ref 8–23)
CO2: 27 mmol/L (ref 22–32)
Calcium: 9.2 mg/dL (ref 8.9–10.3)
Chloride: 108 mmol/L (ref 98–111)
Creatinine, Ser: 1.14 mg/dL (ref 0.61–1.24)
GFR calc Af Amer: >60 mL/min (ref >60)
GFR calc non Af Amer: >60 mL/min (ref >60)
Glucose, Bld: 119 mg/dL — ABNORMAL HIGH (ref 70–99)
Potassium: 3.3 mmol/L — ABNORMAL LOW (ref 3.5–5.1)
Sodium: 142 mmol/L (ref 135–145)

## 2018-07-29 LAB — CBC
HCT: 38.5 % — ABNORMAL LOW (ref 39.0–52.0)
Hemoglobin: 13.5 g/dL (ref 13.0–17.0)
MCH: 30.8 pg (ref 26.0–34.0)
MCHC: 35.1 g/dL (ref 30.0–36.0)
MCV: 87.9 fL (ref 80.0–100.0)
Platelets: 157 10*3/uL (ref 150–400)
RBC: 4.38 MIL/uL (ref 4.22–5.81)
RDW: 14.5 % (ref 11.5–15.5)
WBC: 7.3 10*3/uL (ref 4.0–10.5)
nRBC: 0 % (ref 0.0–0.2)

## 2018-07-29 LAB — PROTIME-INR
INR: 2.1 — ABNORMAL HIGH (ref 0.8–1.2)
INR: 1.8 — ABNORMAL HIGH (ref 0.8–1.2)
Prothrombin Time: 23.3 seconds — ABNORMAL HIGH (ref 11.4–15.2)
Prothrombin Time: 20.5 seconds — ABNORMAL HIGH (ref 11.4–15.2)

## 2018-07-29 SURGERY — LEFT HEART CATH AND CORONARY ANGIOGRAPHY
Anesthesia: LOCAL

## 2018-07-29 MED ORDER — LIDOCAINE HCL (PF) 1 % IJ SOLN
INTRAMUSCULAR | Status: DC | PRN
  Administered 2018-07-29: 3 mL via SUBCUTANEOUS

## 2018-07-29 MED ORDER — ONDANSETRON HCL 4 MG/2ML IJ SOLN: 4.0000 mg | Freq: Four times a day (QID) | INTRAMUSCULAR | Status: DC | PRN

## 2018-07-29 MED ORDER — HYDRALAZINE HCL 20 MG/ML IJ SOLN: 10.0000 mg | INTRAMUSCULAR | Status: AC | PRN

## 2018-07-29 MED ORDER — FENTANYL CITRATE (PF) 100 MCG/2ML IJ SOLN
INTRAMUSCULAR | Status: AC
  Filled 2018-07-29: qty 2

## 2018-07-29 MED ORDER — WARFARIN - PHARMACIST DOSING INPATIENT: Freq: Every day | Status: DC

## 2018-07-29 MED ORDER — WARFARIN SODIUM 5 MG PO TABS: 5.0000 mg | ORAL_TABLET | Freq: Once | ORAL | Status: DC

## 2018-07-29 MED ORDER — ASPIRIN 81 MG PO CHEW: 81.0000 mg | CHEWABLE_TABLET | Freq: Every day | ORAL | 0 refills | Status: DC

## 2018-07-29 MED ORDER — SODIUM CHLORIDE 0.9% FLUSH: 3.0000 mL | Freq: Two times a day (BID) | INTRAVENOUS | Status: DC

## 2018-07-29 MED ORDER — SODIUM CHLORIDE 0.9 % IV SOLN: 250.0000 mL | INTRAVENOUS | Status: DC | PRN

## 2018-07-29 MED ORDER — HEPARIN SODIUM (PORCINE) 1000 UNIT/ML IJ SOLN
INTRAMUSCULAR | Status: DC | PRN
  Administered 2018-07-29: 5000 [IU] via INTRAVENOUS

## 2018-07-29 MED ORDER — FENTANYL CITRATE (PF) 100 MCG/2ML IJ SOLN
INTRAMUSCULAR | Status: DC | PRN
  Administered 2018-07-29: 25 ug via INTRAVENOUS

## 2018-07-29 MED ORDER — POTASSIUM CHLORIDE CRYS ER 20 MEQ PO TBCR
40.0000 meq | EXTENDED_RELEASE_TABLET | Freq: Once | ORAL | Status: AC
  Administered 2018-07-29: 40 meq via ORAL
  Filled 2018-07-29: qty 2

## 2018-07-29 MED ORDER — VERAPAMIL HCL 2.5 MG/ML IV SOLN
INTRAVENOUS | Status: AC
  Filled 2018-07-29: qty 2

## 2018-07-29 MED ORDER — SODIUM CHLORIDE 0.9 % WEIGHT BASED INFUSION: 1.0000 mL/kg/h | INTRAVENOUS | Status: DC

## 2018-07-29 MED ORDER — VERAPAMIL HCL 2.5 MG/ML IV SOLN
INTRAVENOUS | Status: DC | PRN
  Administered 2018-07-29: 11:00:00 10 mL via INTRA_ARTERIAL

## 2018-07-29 MED ORDER — IOHEXOL 350 MG/ML SOLN
INTRAVENOUS | Status: DC | PRN
  Administered 2018-07-29: 12:00:00 55 mL via INTRAVENOUS

## 2018-07-29 MED ORDER — MIDAZOLAM HCL 2 MG/2ML IJ SOLN
INTRAMUSCULAR | Status: DC | PRN
  Administered 2018-07-29: 1 mg via INTRAVENOUS

## 2018-07-29 MED ORDER — HEPARIN (PORCINE) IN NACL 1000-0.9 UT/500ML-% IV SOLN
INTRAVENOUS | Status: DC | PRN
  Administered 2018-07-29 (×2): 500 mL

## 2018-07-29 MED ORDER — LIDOCAINE HCL (PF) 1 % IJ SOLN
INTRAMUSCULAR | Status: AC
  Filled 2018-07-29: qty 30

## 2018-07-29 MED ORDER — LABETALOL HCL 5 MG/ML IV SOLN: 10.0000 mg | INTRAVENOUS | Status: AC | PRN

## 2018-07-29 MED ORDER — ROSUVASTATIN CALCIUM 40 MG PO TABS: 40.0000 mg | ORAL_TABLET | Freq: Every day | ORAL | 0 refills | Status: DC

## 2018-07-29 MED ORDER — SODIUM CHLORIDE 0.9% FLUSH: 3.0000 mL | INTRAVENOUS | Status: DC | PRN

## 2018-07-29 MED ORDER — MIDAZOLAM HCL 2 MG/2ML IJ SOLN
INTRAMUSCULAR | Status: AC
  Filled 2018-07-29: qty 2

## 2018-07-29 MED ORDER — HEPARIN (PORCINE) IN NACL 1000-0.9 UT/500ML-% IV SOLN
INTRAVENOUS | Status: AC
  Filled 2018-07-29: qty 1000

## 2018-07-29 MED ORDER — HEPARIN SODIUM (PORCINE) 1000 UNIT/ML IJ SOLN
INTRAMUSCULAR | Status: AC
  Filled 2018-07-29: qty 1

## 2018-07-29 SURGICAL SUPPLY — 9 items
CATH 5FR JL3.5 JR4 ANG PIG MP (CATHETERS) ×2 IMPLANT
DEVICE RAD COMP TR BAND LRG (VASCULAR PRODUCTS) ×2 IMPLANT
GLIDESHEATH SLEND SS 6F .021 (SHEATH) ×2 IMPLANT
GUIDEWIRE INQWIRE 1.5J.035X260 (WIRE) ×1 IMPLANT
INQWIRE 1.5J .035X260CM (WIRE) ×2
KIT HEART LEFT (KITS) ×2 IMPLANT
PACK CARDIAC CATHETERIZATION (CUSTOM PROCEDURE TRAY) ×2 IMPLANT
TRANSDUCER W/STOPCOCK (MISCELLANEOUS) ×2 IMPLANT
TUBING CIL FLEX 10 FLL-RA (TUBING) ×2 IMPLANT

## 2018-07-29 NOTE — H&P (View-Only) (Signed)
Progress Note  Patient Name: Kevin Hart Date of Encounter: 07/29/2018  Primary Cardiologist: Peter Martinique, MD   Subjective   No symptoms, INR of 1.8 this morning.  Stable for coronary angiography.  Inpatient Medications    Scheduled Meds: . allopurinol  300 mg Oral Daily  . aspirin  81 mg Oral Daily  . furosemide  40 mg Oral Daily  . multivitamin with minerals  1 tablet Oral Daily  . nitroGLYCERIN  0.4 mg Transdermal Daily  . pantoprazole  40 mg Oral Daily  . rosuvastatin  20 mg Oral Daily  . sacubitril-valsartan  1 tablet Oral BID  . sodium chloride flush  3 mL Intravenous Q12H   Continuous Infusions: . sodium chloride    . sodium chloride 1 mL/kg/hr (07/29/18 0521)   PRN Meds: sodium chloride, acetaminophen **OR** acetaminophen, nitroGLYCERIN, promethazine, senna-docusate, sodium chloride flush   Vital Signs    Vitals:   07/28/18 0624 07/28/18 1430 07/28/18 2026 07/29/18 0512  BP: 109/76 105/68 125/78 138/85  Pulse: 79 79 91 91  Resp:   14 15  Temp: 98.2 F (36.8 C) 97.8 F (36.6 C) 98.8 F (37.1 C) 98.4 F (36.9 C)  TempSrc: Oral Oral Oral Oral  SpO2: 96% 97% 96% 97%  Weight: 101.1 kg   100.8 kg  Height:        Intake/Output Summary (Last 24 hours) at 07/29/2018 0826 Last data filed at 07/28/2018 1300 Gross per 24 hour  Intake 480 ml  Output -  Net 480 ml   Last 3 Weights 07/29/2018 07/28/2018 07/26/2018  Weight (lbs) 222 lb 4.8 oz 222 lb 12.8 oz 225 lb 3.2 oz  Weight (kg) 100.835 kg 101.061 kg 102.15 kg      Telemetry    Not reviewed- Personally Reviewed  ECG    Not performed today - Personally Reviewed  Physical Exam   GEN: No acute distress.   Neck: No JVD Cardiac: RRR, no murmurs, rubs, or gallops.  Respiratory: Clear to auscultation bilaterally. GI: Soft, nontender, non-distended  MS: No edema; No deformity. Neuro:  Nonfocal  Psych: Normal affect   Labs    Chemistry Recent Labs  Lab 07/26/18 1724 07/27/18 0627 07/29/18 0224   NA 140 142 142  K 3.4* 3.5 3.3*  CL 107 109 108  CO2 23 24 27   GLUCOSE 117* 101* 119*  BUN 18 16 13   CREATININE 1.21 1.09 1.14  CALCIUM 9.3 9.0 9.2  GFRNONAA 59* >60 >60  GFRAA >60 >60 >60  ANIONGAP 10 9 7      Hematology Recent Labs  Lab 07/26/18 1724 07/27/18 0627 07/29/18 0224  WBC 6.3 5.7 7.3  RBC 4.82 4.43 4.38  HGB 14.6 13.4 13.5  HCT 42.7 39.5 38.5*  MCV 88.6 89.2 87.9  MCH 30.3 30.2 30.8  MCHC 34.2 33.9 35.1  RDW 14.9 14.9 14.5  PLT 185 168 157    Cardiac Enzymes Recent Labs  Lab 07/26/18 1724 07/26/18 2312 07/27/18 0627 07/27/18 1154  TROPONINI <0.03 <0.03 <0.03 <0.03   No results for input(s): TROPIPOC in the last 168 hours.   BNPNo results for input(s): BNP, PROBNP in the last 168 hours.   DDimer No results for input(s): DDIMER in the last 168 hours.   Radiology    No results found.  Cardiac Studies   2D Echo 08/18/17 Study Conclusions  - Left ventricle: The cavity size was normal. Wall thickness was   increased in a pattern of mild LVH. Systolic function was  moderately reduced. The estimated ejection fraction was in the   range of 35% to 40%. Anterior, anteroseptal, apical and   inferoapical hypokinesis. Doppler parameters are consistent with   abnormal left ventricular relaxation (grade 1 diastolic   dysfunction). The E/e&' ratio is <8, suggesting normal LV filling   pressure. - Aortic valve: Sclerosis without stenosis. There was mild   regurgitation. - Mitral valve: Mildly thickened leaflets . There was trivial   regurgitation. - Left atrium: The atrium was normal in size. - Right ventricle: The cavity size was mildly dilated. Pacer or   AICD wire in right ventricle. Systolic function was normal. - Right atrium: The atrium was mildly dilated. Pacer or AICD wire   noted in right atrium. - Tricuspid valve: There was trivial regurgitation. - Pulmonary arteries: PA peak pressure: 25 mm Hg (S). - Inferior vena cava: The vessel was  normal in size. The   respirophasic diameter changes were in the normal range (= 50%),   consistent with normal central venous pressure.  Impressions:  - Compared to a prior study in 07/2015, the LVEF has slightly   improved to 35-40% with persistent LAD territory wall motion   abnormalities.   Patient Profile     73 y.o. 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  Assessment & Plan    1. Known CAD w/ Chest Pain Concerning for [place]: symptoms similar in character to CP prior to his PCI in 2016. He has ruled out for NSTEMI with negative enzymes. Plan is for cardiac cath today. Continue ASA and statin. Not on  blocker due to soft BP.   2. H/o Recurrent DVT w/ Positive Lupus Anticoagulant: on coumadin as outpatient. Currently on hold for anticipated cath. On IV heparin. Resume once all invasive procedures are completed.   3. Chronic Systolic CHF: Weight and volume have been stable. Continue lasix and Entresto. Unable to add  blocker due to soft blood pressure.   4. 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 Followed by EP.   5. HLD: last LDL level was not at goal. Lipid panel 12/16/2017: Cholesterol, Total 140; HDL 40; LDL Calculated 78; Triglycerides 111. On Crestor 20 mg. Recommend increasing to 40 or, if unable to tolerate higher dose due to side effects, can add Zetia.   6. Hypokalemia: 3.3 today. Will give 40 mEq of Kdur now.   7. Chronic Anticoagulation: on coumadin for recurrent DVTs. Currently on hold for cath. Repeat INR this morning is 1.8. [place] for cath today. Resume post cath w/ heparin bridge.      For questions or updates, please contact [place] Please consult [email or web address] for contact info under        Signed, Lyda Jester, PA-C  07/29/2018, 8:26 AM    Agree  with note written by Ellen Henri  PAC  INR 1.8 this morning.  Coumadin has been on hold.  Stable for diagnostic coronary angiography this morning.  [name] 07/29/2018 10:53 AM

## 2018-07-29 NOTE — Progress Notes (Signed)
  Date: 07/29/2018  Patient name: Kevin Hart  Medical record number: [id number]  Date of birth: [birth year]   I have seen and evaluated this patient and I have discussed the plan of care with the house staff. Please see their note for complete details. I concur with their findings with the following additions/corrections: Mr. Mckillop was seen this morning on team rounds.  His INR is now 1.8!  Cards seems to be planning a cath at noon and we will follow-up with their findings and recommendations.  Bartholomew Crews, MD 07/29/2018, 9:27 AM

## 2018-07-29 NOTE — Discharge Summary (Signed)
Name: Kevin Hart MRN: [id number] DOB: 10-15-1945 73 y.o. PCP: Guadlupe Spanish, MD  Date of Admission: 07/26/2018  5:20 PM Date of Discharge: 07/29/18 Attending Physician: Bartholomew Crews, MD  Discharge Diagnosis: 1. Unstable Angina  2. Chronic systolic heart failure 3. Chronic anticoagulation with warfarin due to recurrent DVTs  Discharge Medications: Allergies as of 07/29/2018   No Known Allergies     Medication List    TAKE these medications   allopurinol 300 MG tablet Commonly known as:  ZYLOPRIM Take 300 mg by mouth daily.   aspirin 81 MG chewable tablet Chew 1 tablet (81 mg total) by mouth daily. Start taking on:  Jul 30, 2018   furosemide 40 MG tablet Commonly known as:  LASIX Take 40 mg by mouth daily.   Multi-Vitamins Tabs Take 1 tablet by mouth daily.   nitroGLYCERIN 0.4 MG SL tablet Commonly known as:  NITROSTAT Place 1 tablet (0.4 mg total) under the tongue every 5 (five) minutes x 3 doses as needed for chest pain.   pantoprazole 40 MG tablet Commonly known as:  PROTONIX Take 1 tablet (40 mg total) by mouth daily.   rosuvastatin 40 MG tablet Commonly known as:  CRESTOR Take 1 tablet (40 mg total) by mouth daily. What changed:    medication strength  how much to take   sacubitril-valsartan 24-26 MG Commonly known as:  Entresto TAKE ONE TABLET BY MOUTH TWICE DAILY -TO REPLACE LISINOPRIL (START 36HOURS AFTER LAST DOSE) What changed:    how much to take  how to take this  when to take this   warfarin 5 MG tablet Commonly known as:  Coumadin Take 0.5-1 tablets (2.5-5 mg total) by mouth See admin instructions. Take 5 mg by mouth on Tuesday, Wednesday, Thursday, Saturday and Sunday then take 2.5 mg by mouth on Monday and Friday What changed:    how much to take  additional instructions       Disposition and follow-up:   Kevin Hart was discharged from [facility] in Good condition.  At the hospital follow  up visit please address:  1. Unstable Angina: Troponins were neg x 3 and ekg without ischemic changes. Heart cath revealed minimal CAD. He was discharged home after the procedure and close outpt f/u was arranged. Crestor increased to 40mg  and started on asa 81mg  qd  2. Chronic systolic heart failure: euvolemic, continued on home lasix  3. Chronic anticoagulation with warfarin due to recurrent DVTs: warfarin was held given supra theraputic INR and need for heart cath. INR on discharge 1.8. Previous DVT was 2yrs ago so Warfarin was resumed on discharge. Instructed to take 5mg  dose Friday night and then resume normal dosing schedule. Reported h/o +lupus anticoag. Please make sure this was tested/diagnosed appropriately. Needs to have tested positive twice to make the diagnosis.    2.  Labs / imaging needed at time of follow-up: INR in one week   3.  Pending labs/ test needing follow-up: none  Follow-up Appointments: Follow-up Information    Guadlupe Spanish, MD. Schedule an appointment as soon as possible for a visit in 1 week(s).   Specialty:  Internal Medicine Why:  Repeat INR Contact information: [place] [address] [phone]        Martinique, Peter M, MD .   Specialty:  Cardiology Contact information: [address] [place] [address] [phone]        Thompson Grayer, MD .  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 He had been on Plavix until a December 2019 admission for a hemorrhagic renal cyst and was stopped on a cardiology follow-up appointment.  He is not on a beta-blocker for his CAD due to relative hypotension.  His chest pain was nonradiating left sided chest pain/pressure intermittently over the last 3 days and feels similar to prior ACS requiring stent placement. EKG was without ischemic changes and troponin was undetectable x 3. INR was subtherapeutic so warfarin was held and heart cath performed on hospital day 2 when INR was <2. Heart cath revealed minimal CAD. He was discharged on home meds with close outpt f/u. Crestor was increased to 40mg  and he was started on asa 81mg  qd.   Discharge Vitals:   BP 126/85   Pulse 91   Temp 98.4 F (36.9 C) (Oral)   Resp 15   Ht 6' (1.829 m)   Wt 100.8 kg   SpO2 100%   BMI 30.15 kg/m   Pertinent Labs, Studies, and Procedures:  BMP Latest Ref Rng & Units 07/29/2018 07/27/2018 07/26/2018  Glucose 70 - 99 mg/dL 119(H) 101(H) 117(H)  BUN 8 - 23 mg/dL 13 16 18   Creatinine 0.61 - 1.24 mg/dL 1.14 1.09 1.21  BUN/Creat Ratio 10 - 24 - - -  Sodium 135 - 145 mmol/L 142 142 140  Potassium 3.5 - 5.1 mmol/L 3.3(L) 3.5 3.4(L)  Chloride 98 - 111 mmol/L 108 109 107  CO2 22 - 32 mmol/L 27 24 23   Calcium 8.9 - 10.3 mg/dL 9.2 9.0 9.3   CBC Latest Ref Rng & Units 07/29/2018 07/27/2018 07/26/2018  WBC 4.0 - 10.5 K/uL 7.3 5.7 6.3  Hemoglobin 13.0 - 17.0 g/dL 13.5 13.4 14.6  Hematocrit 39.0 - 52.0 % 38.5(L) 39.5 42.7  Platelets 150 - 400 K/uL 157 168 185   Left heart Cath:  Prox LAD to Mid LAD lesion is 30% stenosed.  LV end diastolic pressure is normal.   1. Mild in-stent restenosis in the LAD 2. Widely patent dominant LCx 3. Patent, nondominant RCA 4. Normal LVEDP  Suspect noncardiac chest pain. Resume warfarin today. Continue medical therapy for secondary risk reduction. OK for discharge home today from cardiac perspective.    Discharge Instructions:  Discharge Instructions    Diet - low sodium heart healthy   Complete by:  As directed    Discharge instructions   Complete by:  As directed    Kevin Hart, Kevin Hart were admitted to the hospital for chest pain. There was a concern that the chest pain was coming from your heart but your heart cath did not show significant coronary vessel disease. Please follow up with your cardiologist. They will schedule an appointment for you.   For your warfarin: - take 5mg  tonight and then resume your normal dosing schedule tomorrow - f/u with your PCP for a repeat INR check next week.  The cardiologist would  like you to start taking a baby aspirin 81mg  daily and increase your Crestor (cholesterol medicine) to 40mg  daily because we checked your cholesterol while you were here and it is still high. If you are unable to tolerate this high dose due to side effects, talk to your doctor about adding Zetia.  If your chest pain returns please come back to the hospital or call your doctor immediately.   Increase activity slowly   Complete by:  As directed       Signed: Isabelle Course, MD 07/29/2018, 1:16 PM   Pager: [phone]

## 2018-07-29 NOTE — Interval H&P Note (Signed)
History and Physical Interval Note:  07/29/2018 11:07 AM  Kevin Hart  has presented today for surgery, with the diagnosis of Chest pain, coronary artery disease.  The various methods of treatment have been discussed with the patient and family. After consideration of risks, benefits and other options for treatment, the patient has consented to  Procedure(s): LEFT HEART CATH AND CORONARY ANGIOGRAPHY ([name]) as a surgical intervention.  The patient's history has been reviewed, patient examined, no change in status, stable for surgery.  I have reviewed the patient's chart and labs.  Questions were answered to the patient's satisfaction.     Sherren Mocha

## 2018-07-29 NOTE — Progress Notes (Addendum)
Progress Note  Patient Name: Kevin Hart Date of Encounter: 07/29/2018  Primary Cardiologist: Peter Martinique, MD   Subjective   No symptoms, INR of 1.8 this morning.  Stable for coronary angiography.  Inpatient Medications    Scheduled Meds: . allopurinol  300 mg Oral Daily  . aspirin  81 mg Oral Daily  . furosemide  40 mg Oral Daily  . multivitamin with minerals  1 tablet Oral Daily  . nitroGLYCERIN  0.4 mg Transdermal Daily  . pantoprazole  40 mg Oral Daily  . rosuvastatin  20 mg Oral Daily  . sacubitril-valsartan  1 tablet Oral BID  . sodium chloride flush  3 mL Intravenous Q12H   Continuous Infusions: . sodium chloride    . sodium chloride 1 mL/kg/hr (07/29/18 0521)   PRN Meds: sodium chloride, acetaminophen **OR** acetaminophen, nitroGLYCERIN, promethazine, senna-docusate, sodium chloride flush   Vital Signs    Vitals:   07/28/18 0624 07/28/18 1430 07/28/18 2026 07/29/18 0512  BP: 109/76 105/68 125/78 138/85  Pulse: 79 79 91 91  Resp:   14 15  Temp: 98.2 F (36.8 C) 97.8 F (36.6 C) 98.8 F (37.1 C) 98.4 F (36.9 C)  TempSrc: Oral Oral Oral Oral  SpO2: 96% 97% 96% 97%  Weight: 101.1 kg   100.8 kg  Height:        Intake/Output Summary (Last 24 hours) at 07/29/2018 0826 Last data filed at 07/28/2018 1300 Gross per 24 hour  Intake 480 ml  Output -  Net 480 ml   Last 3 Weights 07/29/2018 07/28/2018 07/26/2018  Weight (lbs) 222 lb 4.8 oz 222 lb 12.8 oz 225 lb 3.2 oz  Weight (kg) 100.835 kg 101.061 kg 102.15 kg      Telemetry    Not reviewed- Personally Reviewed  ECG    Not performed today - Personally Reviewed  Physical Exam   GEN: No acute distress.   Neck: No JVD Cardiac: RRR, no murmurs, rubs, or gallops.  Respiratory: Clear to auscultation bilaterally. GI: Soft, nontender, non-distended  MS: No edema; No deformity. Neuro:  Nonfocal  Psych: Normal affect   Labs    Chemistry Recent Labs  Lab 07/26/18 1724 07/27/18 0627 07/29/18 0224   NA 140 142 142  K 3.4* 3.5 3.3*  CL 107 109 108  CO2 23 24 27   GLUCOSE 117* 101* 119*  BUN 18 16 13   CREATININE 1.21 1.09 1.14  CALCIUM 9.3 9.0 9.2  GFRNONAA 59* >60 >60  GFRAA >60 >60 >60  ANIONGAP 10 9 7      Hematology Recent Labs  Lab 07/26/18 1724 07/27/18 0627 07/29/18 0224  WBC 6.3 5.7 7.3  RBC 4.82 4.43 4.38  HGB 14.6 13.4 13.5  HCT 42.7 39.5 38.5*  MCV 88.6 89.2 87.9  MCH 30.3 30.2 30.8  MCHC 34.2 33.9 35.1  RDW 14.9 14.9 14.5  PLT 185 168 157    Cardiac Enzymes Recent Labs  Lab 07/26/18 1724 07/26/18 2312 07/27/18 0627 07/27/18 1154  TROPONINI <0.03 <0.03 <0.03 <0.03   No results for input(s): TROPIPOC in the last 168 hours.   BNPNo results for input(s): BNP, PROBNP in the last 168 hours.   DDimer No results for input(s): DDIMER in the last 168 hours.   Radiology    No results found.  Cardiac Studies   2D Echo 08/18/17 Study Conclusions  - Left ventricle: The cavity size was normal. Wall thickness was   increased in a pattern of mild LVH. Systolic function was  moderately reduced. The estimated ejection fraction was in the   range of 35% to 40%. Anterior, anteroseptal, apical and   inferoapical hypokinesis. Doppler parameters are consistent with   abnormal left ventricular relaxation (grade 1 diastolic   dysfunction). The E/e&' ratio is <8, suggesting normal LV filling   pressure. - Aortic valve: Sclerosis without stenosis. There was mild   regurgitation. - Mitral valve: Mildly thickened leaflets . There was trivial   regurgitation. - Left atrium: The atrium was normal in size. - Right ventricle: The cavity size was mildly dilated. Pacer or   AICD wire in right ventricle. Systolic function was normal. - Right atrium: The atrium was mildly dilated. Pacer or AICD wire   noted in right atrium. - Tricuspid valve: There was trivial regurgitation. - Pulmonary arteries: PA peak pressure: 25 mm Hg (S). - Inferior vena cava: The vessel was  normal in size. The   respirophasic diameter changes were in the normal range (= 50%),   consistent with normal central venous pressure.  Impressions:  - Compared to a prior study in 07/2015, the LVEF has slightly   improved to 35-40% with persistent LAD territory wall motion   abnormalities.   Patient Profile     73 y.o. 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  Assessment & Plan    1. Known CAD w/ Chest Pain Concerning for [place]: symptoms similar in character to CP prior to his PCI in 2016. He has ruled out for NSTEMI with negative enzymes. Plan is for cardiac cath today. Continue ASA and statin. Not on  blocker due to soft BP.   2. H/o Recurrent DVT w/ Positive Lupus Anticoagulant: on coumadin as outpatient. Currently on hold for anticipated cath. On IV heparin. Resume once all invasive procedures are completed.   3. Chronic Systolic CHF: Weight and volume have been stable. Continue lasix and Entresto. Unable to add  blocker due to soft blood pressure.   4. 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 Followed by EP.   5. HLD: last LDL level was not at goal. Lipid panel 12/16/2017: Cholesterol, Total 140; HDL 40; LDL Calculated 78; Triglycerides 111. On Crestor 20 mg. Recommend increasing to 40 or, if unable to tolerate higher dose due to side effects, can add Zetia.   6. Hypokalemia: 3.3 today. Will give 40 mEq of Kdur now.   7. Chronic Anticoagulation: on coumadin for recurrent DVTs. Currently on hold for cath. Repeat INR this morning is 1.8. [place] for cath today. Resume post cath w/ heparin bridge.      For questions or updates, please contact [place] Please consult [email or web address] for contact info under        Signed, Lyda Jester, PA-C  07/29/2018, 8:26 AM    Agree  with note written by Ellen Henri  PAC  INR 1.8 this morning.  Coumadin has been on hold.  Stable for diagnostic coronary angiography this morning.  [name] 07/29/2018 10:53 AM   Addendum: [name] catheterization performed this morning by Dr. Burt Knack radially revealed noncritical CAD.  Patient can be discharged home today on his home Coumadin dose.  The only complicated issue is that he has a lupus anticoagulant and has had recurrent DVTs.  If he had DVT tees within the last 6 to 12 months he probably should stay  for heparin/Coumadin crossover.  If it is been a long time since his last DVT I suspect it will be safe to discharge him home on his current Coumadin dose.  We will arrange follow-up with Dr. Peter Martinique in 2 to 3 weeks.  His INRs checked by his PCP, Dr. Holley Raring .  I suggested that this be checked in at least 1 week.  Lorretta Harp, M.D., [place], [facility], [place] [address] [place], [place]  [address]  [phone] 07/29/2018 12:09 PM

## 2018-07-29 NOTE — Progress Notes (Signed)
   Subjective:  No more chest pain. Still has h/ad from nitro patch. Repeat INR 1.8 this morning. He thinks cardiology is planning to cath him at 12pm. Will f/u their formal plan. Heparin per pharmacy.   Objective:  Vital signs in last 24 hours: Vitals:   07/28/18 0624 07/28/18 1430 07/28/18 2026 07/29/18 0512  BP: 109/76 105/68 125/78 138/85  Pulse: 79 79 91 91  Resp:   14 15  Temp: 98.2 F (36.8 C) 97.8 F (36.6 C) 98.8 F (37.1 C) 98.4 F (36.9 C)  TempSrc: Oral Oral Oral Oral  SpO2: 96% 97% 96% 97%  Weight: 101.1 kg   100.8 kg  Height:       Gen: sitting in chair, NAD Cardiac: RRR, no m/r/g  Assessment/Plan:  Active Problems:   Unstable angina [facility])   Mr. Kevin Hart is a 73 year old male with coronary artery disease and prior heart attacks (2000 MI with PCI & stenting of LAD; 2016 in-stent restenosis of his LAD s/p DES), s/p single-chamber ICD placed in 7510, chronic systolic heart failure (EF 35 to 40% from echo on 07/2016), OSA on CPAP, recurrent DVT with positive lupus anticoagulant on long-term OAC with warfarin, and history of hemorrhagic renal cyst who presents with chest pain.  Unstable angina: Trops neg x 3, denies further chest pain. Nitro patch placed 5/6.  - repeat INR this morning 1.8, hopeful for cath today - f/u cards recs - heparin per pharmacy    Chronic systolic heart failure: stable, euvolemic  - Continue home Lasix 40 mg daily - Intake and output - Daily weights  Hypertension: well controlled  -Continue home Entresto BID  History recurrent DVT on longterm OAC: Stable Hold home warfarin  FEN/GI: Heart healthy DVT prophylaxis: INR<2, will start heparin  CODE STATUS: Full  Dispo: Anticipated discharge in approximately 0-1 day(s).   Isabelle Course, MD 07/29/2018, 8:40 AM Pager: [phone]

## 2018-07-29 NOTE — Progress Notes (Signed)
[place] for heparin Indication: DVT/lupus anticoagulant   Patient Measurements: Height: 6' (182.9 cm) Weight: 222 lb 4.8 oz (100.8 kg) IBW/kg (Calculated) : 77.6  Vital Signs: Temp: 98.4 F (36.9 C) (05/08 0512) Temp Source: Oral (05/08 0512) BP: 126/85 (05/08 1209) Pulse Rate: 91 (05/08 0512)  Labs: Recent Labs    07/26/18 1724  07/26/18 2312 07/27/18 0627 07/27/18 1154 07/28/18 0338 07/29/18 0224 07/29/18 0740  HGB 14.6  --   --  13.4  --   --  13.5  --   HCT 42.7  --   --  39.5  --   --  38.5*  --   PLT 185  --   --  168  --   --  157  --   LABPROT  --    < >  --  32.0*  --  27.2* 23.3* 20.5*  INR  --    < >  --  3.2*  --  2.6* 2.1* 1.8*  CREATININE 1.21  --   --  1.09  --   --  1.14  --   TROPONINI <0.03  --  <0.03 <0.03 <0.03  --   --   --    < > = values in this interval not displayed.   Assessment: 20 yoM on warfarin PTA for hx recurrent DVTs and hx of lupus anticoagulant admitted with CP.   INR elevated on admit at 3.50 - last dose of warfarin was 5/3.  Down to 1.8 today.  CBC stable, troponins negative.  S/p cardiac cath this morning, planning medical management.  Goal of Therapy:  INR 2-3 Monitor platelets by anticoagulation protocol: Yes  Plan:  -Resume warfarin 5 mg x 1 tonight. -Daily PT/INR.  Marguerite Olea, [facility] Clinical Pharmacist Phone [phone]  07/29/2018 12:36 PM

## 2018-08-01 ENCOUNTER — Telehealth: Payer: Self-pay | Admitting: Cardiology

## 2018-08-01 NOTE — Telephone Encounter (Signed)
LVM for patient to call and schedule 2 week followup with Eulas Post.

## 2018-08-04 DIAGNOSIS — Z7901 Long term (current) use of anticoagulants: Secondary | ICD-10-CM | POA: Diagnosis not present

## 2018-08-04 DIAGNOSIS — I2510 Atherosclerotic heart disease of native coronary artery without angina pectoris: Secondary | ICD-10-CM | POA: Diagnosis not present

## 2018-08-04 DIAGNOSIS — I509 Heart failure, unspecified: Secondary | ICD-10-CM | POA: Diagnosis not present

## 2018-08-04 DIAGNOSIS — E876 Hypokalemia: Secondary | ICD-10-CM | POA: Diagnosis not present

## 2018-08-04 DIAGNOSIS — I10 Essential (primary) hypertension: Secondary | ICD-10-CM | POA: Diagnosis not present

## 2018-08-10 ENCOUNTER — Telehealth: Payer: Self-pay | Admitting: Physician Assistant

## 2018-08-10 NOTE — Telephone Encounter (Signed)
Smartphone/video pre-reg complete, mychart active, verbal consent given 08/10/2018 MS

## 2018-08-12 ENCOUNTER — Telehealth (INDEPENDENT_AMBULATORY_CARE_PROVIDER_SITE_OTHER): Payer: PPO | Admitting: Physician Assistant

## 2018-08-12 ENCOUNTER — Ambulatory Visit (INDEPENDENT_AMBULATORY_CARE_PROVIDER_SITE_OTHER): Payer: PPO

## 2018-08-12 ENCOUNTER — Other Ambulatory Visit: Payer: Self-pay

## 2018-08-12 VITALS — BP 123/61 | HR 88 | Ht 72.0 in | Wt 217.0 lb

## 2018-08-12 DIAGNOSIS — I5022 Chronic systolic (congestive) heart failure: Secondary | ICD-10-CM

## 2018-08-12 DIAGNOSIS — Z95810 Presence of automatic (implantable) cardiac defibrillator: Secondary | ICD-10-CM

## 2018-08-12 DIAGNOSIS — I10 Essential (primary) hypertension: Secondary | ICD-10-CM

## 2018-08-12 DIAGNOSIS — I825Y3 Chronic embolism and thrombosis of unspecified deep veins of proximal lower extremity, bilateral: Secondary | ICD-10-CM

## 2018-08-12 DIAGNOSIS — I2510 Atherosclerotic heart disease of native coronary artery without angina pectoris: Secondary | ICD-10-CM

## 2018-08-12 DIAGNOSIS — I255 Ischemic cardiomyopathy: Secondary | ICD-10-CM

## 2018-08-12 DIAGNOSIS — E785 Hyperlipidemia, unspecified: Secondary | ICD-10-CM

## 2018-08-12 DIAGNOSIS — R0789 Other chest pain: Secondary | ICD-10-CM | POA: Diagnosis not present

## 2018-08-12 NOTE — Progress Notes (Signed)
EPIC Encounter for ICM Monitoring  Patient Name: Kevin Hart is a 73 y.o. male Date: 08/12/2018 Primary Care Physican: Guadlupe Spanish, MD Primary [place] Electrophysiologist: Allred 07/11/2018 Weight: 220 lbs  Spoke with patient.Pt is asymptomatic.  He has not had any further chest pain since hospitalization on 07/26/2018 and catheterization.   [place] Thoracic impedancedid not record from 07/07/2018 - 08/07/2018.Chanetta Marshall, NP reviewed 5/22 report and noted some PVC's on EGM which may interfere with recording impedance.   Prescribed:Furosemide 40 mg 1 tablet daily.  Labs: 07/29/2018 Creatinine 1.14, BUN 13, Potassium 3.3, Sodium 142, GFR >60 07/27/2018 Creatinine 1.09, BUN 16, Potassium 3.5, Sodium 142, GFR >60  07/26/2018 Creatinine 1.21, BUN 18, Potassium 3.4, Sodium 140, GFR 59->60  A complete set of results can be found in Results Review.  Recommendations:No changes. Encouraged to call for fluid symptoms.  Follow-up plan: ICM clinic phone appointment on6/22/2020.   Copy of ICM check sent to Dr.Allred.   3 month ICM trend: 08/12/2018    1 Year ICM trend:       Rosalene Billings, RN 08/12/2018 4:12 PM

## 2018-08-12 NOTE — Progress Notes (Signed)
Virtual Visit via Video Note   This visit type was conducted due to national recommendations for restrictions regarding the COVID-19 Pandemic (e.g. social distancing) in an effort to limit this patient's exposure and mitigate transmission in our community.  Due to his co-morbid illnesses, this patient is at least at moderate risk for complications without adequate follow up.  This format is felt to be most appropriate for this patient at this time.  All issues noted in this document were discussed and addressed.  A limited physical exam was performed with this format.  Please refer to the patient's chart for his consent to telehealth for [facility].   Date:  08/12/2018   ID:  Kevin, Hart October 31, 1945, MRN [id number]  Patient Location: Home Provider Location: Home  PCP:  Guadlupe Spanish, MD  Cardiologist:  Peter Martinique, MD  Electrophysiologist:  Thompson Grayer, MD   Evaluation Performed:  Follow-Up Visit  Chief Complaint:  followup  History of Present Illness:    Kevin Hart is a 73 y.o. male with PMH of CAD, h/o DVT on coumadin (positive lupus anticoagulant), chronic systolic heart failure, hypertension, history of St Jude single-chamber ICD in 2016 for ICM, and obstructive sleep apnea.  He was admitted in April 2016 and underwent DES to LAD for in-stent restenosis.  Cardiac catheterization showed a 90% mid LAD lesion, EF 25 to 30%.  Repeat echocardiogram by May 2017 showed EF 30 to 35%.  He subsequently underwent ICD implantation by Dr. Rayann Heman in September 2016.  Myoview obtained in July 2018 showed EF 34%, large defect of moderate severity present in the basal inferior, mid anterior, mid inferoseptal, mid inferior, apical anterior, apical septal and apical inferior location most consistent with previous scar however has very mild peri-infarct ischemia.  Echocardiogram was repeated in May 2019 which showed EF 35 to 40%, mild LVH, mild AI, trivial MR.  He was diagnosed with  right hemorrhagic renal cyst in August 2019.  Plavix was discontinued at the time and Coumadin was restarted after 1 week.  He was admitted in December 2019 was bladder outlet obstruction due to BPH.  TURP procedure was performed.  More recently, patient presented to the hospital on 07/27/2018 with chest pain.  Cardiac catheterization performed on 07/29/2018 showed mild in-stent restenosis in LAD measuring up to 30%, widely patent RCA and left circumflex, normal LVEDP.  The chest pain was felt to be noncardiac in origin.  Patient was contacted via doximity video conference visit.  He continued to have occasional left-sided chest pain radiating to the left flank.  This is not brought on by exertion and the last only a few minutes.  The recent cardiac catheterization was reassuring.  I will need to double check with Dr. Martinique to see if this patient should still be on aspirin and whether the aspirin can be taken off.  His last PCI was in 2016.  Otherwise patient will need a fasting lipid panel and LFT in 6 to 8 weeks.  He will keep his follow-up with Dr. Martinique in September.  The patient does not have symptoms concerning for COVID-19 infection (fever, chills, cough, or new shortness of breath).    Past Medical History:  Diagnosis Date   Arthritis    Bladder stone 10/26/2017   BPH (benign prostatic hyperplasia)    takes Flomax daily   CAD (coronary artery disease)    a. prior LAD stenting;  b. 06/2014 NSTEMI/PCI: LM nl, LAD 30p, 82m ISR (3.0x20 Promus  DES), LCX nl, RCA nondom, nl, EF 25-30% ant, apical, dist inf AK.   Cataracts, bilateral    immature   Chronic systolic CHF (congestive heart failure) ([place]) 12/31/2014   DVT (deep venous thrombosis) ([facility])    RECURRENT left leg   Dyslipidemia    GERD (gastroesophageal reflux disease)    Gout    History of kidney stones    Hypertension    Ischemic cardiomyopathy 07/06/2014   Lupus anticoagulant positive    Myocardial infarct ([place]) 2000     Myocardial infarct ([place]) 2016   S/P drug eluting coronary stent placement, 07/06/14, Promus 07/07/2014   Sleep apnea    biPaP   Subarachnoid bleed ([place]) 2015   after falling and hitting his head   Past Surgical History:  Procedure Laterality Date   COLONOSCOPY     EP IMPLANTABLE DEVICE [name] 11/27/2014   St. Jude Medical Fortify Assura VR  implanted by Dr Rayann Heman for primary prevention   LEFT HEART CATH AND CORONARY ANGIOGRAPHY [name] 07/29/2018   Procedure: LEFT HEART CATH AND CORONARY ANGIOGRAPHY;  Surgeon: Sherren Mocha, MD;  Location: [facility] CV LAB;  Service: Cardiovascular;  Laterality: [name];   LEFT HEART CATHETERIZATION WITH CORONARY ANGIOGRAM [name] 07/06/2014   Procedure: LEFT HEART CATHETERIZATION WITH CORONARY ANGIOGRAM;  Surgeon: Peter M Martinique, MD;  Location: [facility] CATH LAB;  Service: Cardiovascular;  Laterality: [name];   PERCUTANEOUS CORONARY STENT INTERVENTION (PCI-S) Right 07/06/2014   Procedure: PERCUTANEOUS CORONARY STENT INTERVENTION (PCI-S);  Surgeon: Peter M Martinique, MD;  Location[facility] CATH LAB;  Service: Cardiovascular;  Laterality: Right;   SHOULDER ARTHROSCOPY WITH SUBACROMIAL DECOMPRESSION Left 03/05/2016   Procedure: SHOULDER ARTHROSCOPY ROTATOR CUFF REPAIR WITH SUBACROMIAL DECOMPRESSION;  Surgeon: Tania Ade, MD;  Location: [facility];  Service: Orthopedics;  Laterality: Left;  SHOULDER ARTHROSCOPY ROTATOR CUFF REPAIR WITH SUBACROMIAL DECOMPRESSION   SHOULDER CLOSED REDUCTION Left 11/29/2015   Procedure: CLOSED REDUCTION SHOULDER;  Surgeon: Tania Ade, MD;  Location: [place];  Service: Orthopedics;  Laterality: Left;   TRANSURETHRAL RESECTION OF PROSTATE [name] 02/24/2018   Procedure: TRANSURETHRAL RESECTION OF THE PROSTATE (TURP);  Surgeon: Irine Seal, MD;  Location: [facility] ORS;  Service: Urology;  Laterality: [name];   WRIST SURGERY Bilateral    plates and screws      Current Meds  Medication Sig   allopurinol (ZYLOPRIM) 300 MG tablet Take 300 mg by mouth daily.     aspirin 81 MG chewable tablet Chew 1 tablet (81 mg total) by mouth daily.   furosemide (LASIX) 40 MG tablet Take 40 mg by mouth daily.    Multiple Vitamin (MULTI-VITAMINS) TABS Take 1 tablet by mouth daily.    nitroGLYCERIN (NITROSTAT) 0.4 MG SL tablet Place 1 tablet (0.4 mg total) under the tongue every 5 (five) minutes x 3 doses as needed for chest pain.   pantoprazole (PROTONIX) 40 MG tablet Take 1 tablet (40 mg total) by mouth daily.   rosuvastatin (CRESTOR) 40 MG tablet Take 1 tablet (40 mg total) by mouth daily.   sacubitril-valsartan (ENTRESTO) 24-26 MG TAKE ONE TABLET BY MOUTH TWICE DAILY -TO REPLACE LISINOPRIL (START 36HOURS AFTER LAST DOSE) (Patient taking differently: Take 1 tablet by mouth 2 (two) times daily. TAKE ONE TABLET BY MOUTH TWICE DAILY -TO REPLACE LISINOPRIL (START 36HOURS AFTER LAST DOSE))   warfarin (COUMADIN) 5 MG tablet Take 0.5-1 tablets (2.5-5 mg total) by mouth See admin instructions. Take 5 mg by mouth on Tuesday, Wednesday, Thursday, Saturday and Sunday then take 2.5 mg by mouth on  Monday and Friday (Patient taking differently: Take 5 mg by mouth See admin instructions. Take 5 mg everyday except Monday and Thursday.)     Allergies:   Patient has no known allergies.   Social History   Tobacco Use   Smoking status: Never Smoker   Smokeless tobacco: Never Used  Substance Use Topics   Alcohol use: Yes    Alcohol/week: 0.0 standard drinks    Comment: seldom    Drug use: No     Family Hx: The patient's family history includes Aneurysm in his father.  ROS:   Please see the history of present illness.     All other systems reviewed and are negative.   Prior CV studies:   The following studies were reviewed today:  Cath 07/29/2018  Prox LAD to Mid LAD lesion is 30% stenosed.  LV end diastolic pressure is normal.   1. Mild in-stent restenosis in the LAD 2. Widely patent dominant LCx 3. Patent, nondominant RCA 4. Normal LVEDP  Suspect  noncardiac chest pain. Resume warfarin today. Continue medical therapy for secondary risk reduction. OK for discharge home today from cardiac perspective.   Labs/Other Tests and Data Reviewed:    EKG:  An ECG dated 08/10/2018 was personally reviewed today and demonstrated:  Normal sinus rhythm with poor R wave progression, first-degree AV block.  Recent Labs: 12/16/2017: ALT 16 07/27/2018: Magnesium 2.0 07/29/2018: BUN 13; Creatinine, Ser 1.14; Hemoglobin 13.5; Platelets 157; Potassium 3.3; Sodium 142   Recent Lipid Panel Lab Results  Component Value Date/Time   CHOL 140 12/16/2017 10:14 AM   TRIG 111 12/16/2017 10:14 AM   HDL 40 12/16/2017 10:14 AM   CHOLHDL 3.5 12/16/2017 10:14 AM   CHOLHDL 4 07/25/2014 10:29 AM   LDLCALC 78 12/16/2017 10:14 AM    Wt Readings from Last 3 Encounters:  08/12/18 217 lb (98.4 kg)  07/29/18 222 lb 4.8 oz (100.8 kg)  05/02/18 226 lb 12.8 oz (102.9 kg)     Objective:    Vital Signs:  BP 123/61    Pulse 88    Ht 6' (1.829 m)    Wt 217 lb (98.4 kg)    BMI 29.43 kg/m    VITAL SIGNS:  reviewed  ASSESSMENT & PLAN:    1. Atypical chest pain: This is chronic for him and it does not occur with exertion.  Recent cardiac catheterization was reassuring.  2. CAD: Very mild in-stent restenosis in the LAD on recent cardiac catheterization.  On aspirin.  I will check with Dr. Martinique to see if aspirin can be discontinued given the need for Coumadin.  Fasting lipid panel and LFT in 6 weeks  3. History of DVT: Positive lupus anticoagulant.  On Coumadin  4. Chronic systolic heart failure: Euvolemic based on symptoms  5. History of ischemic cardiomyopathy status post ICD: Managed by Dr. Rayann Heman  COVID-19 Education: The signs and symptoms of COVID-19 were discussed with the patient and how to seek care for testing (follow up with PCP or arrange E-visit).  The importance of social distancing was discussed today.  Time:   Today, I have spent 11 minutes with the  patient with telehealth technology discussing the above problems.     Medication Adjustments/Labs and Tests Ordered: Current medicines are reviewed at length with the patient today.  Concerns regarding medicines are outlined above.   Tests Ordered: No orders of the defined types were placed in this encounter.   Medication Changes: No orders of the defined types  were placed in this encounter.   Disposition:  Follow up in 4 month(s)  Signed, Almyra Deforest, [place]  08/12/2018 11:39 AM    [facility]

## 2018-08-12 NOTE — Patient Instructions (Signed)
Medication Instructions:   Your physician recommends that you continue on your current medications as directed. Please refer to the Current Medication list given to you today.  If you need a refill on your cardiac medications before your next appointment, please call your pharmacy.   Lab work:  You will need to have labs (blood work) drawn in 6 to 8 weeks:  Fasting Lipid Liver Function  If you have labs (blood work) drawn today and your tests are completely normal, you will receive your results only by: Marland Kitchen MyChart Message (if you have MyChart) OR . A paper copy in the mail If you have any lab test that is abnormal or we need to change your treatment, we will call you to review the results.  Testing/Procedures:  NONE ordered at this time of appointment    Follow-Up: At [facility], you and your health needs are our priority.  As part of our continuing mission to provide you with exceptional heart care, we have created designated Provider Care Teams.  These Care Teams include your primary Cardiologist (physician) and Advanced Practice Providers (APPs -  Physician Assistants and Nurse Practitioners) who all work together to provide you with the care you need, when you need it.  Your physician recommends that you keep your scheduled appointment with Kevin Martinique, MD in September  Any Other Special Instructions Will Be Listed Below (If Applicable).

## 2018-08-18 DIAGNOSIS — F320 Major depressive disorder, single episode, mild: Secondary | ICD-10-CM | POA: Diagnosis not present

## 2018-08-18 DIAGNOSIS — I10 Essential (primary) hypertension: Secondary | ICD-10-CM | POA: Diagnosis not present

## 2018-08-18 DIAGNOSIS — G4733 Obstructive sleep apnea (adult) (pediatric): Secondary | ICD-10-CM | POA: Diagnosis not present

## 2018-08-18 DIAGNOSIS — I2510 Atherosclerotic heart disease of native coronary artery without angina pectoris: Secondary | ICD-10-CM | POA: Diagnosis not present

## 2018-08-19 DIAGNOSIS — Z7901 Long term (current) use of anticoagulants: Secondary | ICD-10-CM | POA: Diagnosis not present

## 2018-08-19 DIAGNOSIS — Z1159 Encounter for screening for other viral diseases: Secondary | ICD-10-CM | POA: Diagnosis not present

## 2018-08-19 DIAGNOSIS — I2510 Atherosclerotic heart disease of native coronary artery without angina pectoris: Secondary | ICD-10-CM | POA: Diagnosis not present

## 2018-08-19 DIAGNOSIS — I10 Essential (primary) hypertension: Secondary | ICD-10-CM | POA: Diagnosis not present

## 2018-08-22 DIAGNOSIS — Z7901 Long term (current) use of anticoagulants: Secondary | ICD-10-CM | POA: Diagnosis not present

## 2018-08-22 DIAGNOSIS — I82409 Acute embolism and thrombosis of unspecified deep veins of unspecified lower extremity: Secondary | ICD-10-CM | POA: Diagnosis not present

## 2018-08-29 DIAGNOSIS — E7800 Pure hypercholesterolemia, unspecified: Secondary | ICD-10-CM | POA: Diagnosis not present

## 2018-08-29 DIAGNOSIS — I82409 Acute embolism and thrombosis of unspecified deep veins of unspecified lower extremity: Secondary | ICD-10-CM | POA: Diagnosis not present

## 2018-08-29 DIAGNOSIS — Z7901 Long term (current) use of anticoagulants: Secondary | ICD-10-CM | POA: Diagnosis not present

## 2018-09-06 DIAGNOSIS — I82409 Acute embolism and thrombosis of unspecified deep veins of unspecified lower extremity: Secondary | ICD-10-CM | POA: Diagnosis not present

## 2018-09-06 DIAGNOSIS — Z7901 Long term (current) use of anticoagulants: Secondary | ICD-10-CM | POA: Diagnosis not present

## 2018-09-06 DIAGNOSIS — M19019 Primary osteoarthritis, unspecified shoulder: Secondary | ICD-10-CM | POA: Diagnosis not present

## 2018-09-12 ENCOUNTER — Other Ambulatory Visit: Payer: Self-pay

## 2018-09-12 ENCOUNTER — Ambulatory Visit (INDEPENDENT_AMBULATORY_CARE_PROVIDER_SITE_OTHER): Payer: PPO

## 2018-09-12 DIAGNOSIS — Z95810 Presence of automatic (implantable) cardiac defibrillator: Secondary | ICD-10-CM

## 2018-09-12 DIAGNOSIS — I5022 Chronic systolic (congestive) heart failure: Secondary | ICD-10-CM | POA: Diagnosis not present

## 2018-09-12 DIAGNOSIS — E785 Hyperlipidemia, unspecified: Secondary | ICD-10-CM

## 2018-09-14 DIAGNOSIS — E785 Hyperlipidemia, unspecified: Secondary | ICD-10-CM | POA: Diagnosis not present

## 2018-09-14 LAB — LIPID PANEL
Chol/HDL Ratio: 2.7 ratio (ref 0.0–5.0)
Cholesterol, Total: 133 mg/dL (ref 100–199)
HDL: 49 mg/dL (ref >39)
LDL Calculated: 59 mg/dL (ref 0–99)
Triglycerides: 123 mg/dL (ref 0–149)
VLDL Cholesterol Cal: 25 mg/dL (ref 5–40)

## 2018-09-14 LAB — HEPATIC FUNCTION PANEL
ALT: 21 IU/L (ref 0–44)
AST: 27 IU/L (ref 0–40)
Albumin: 4.6 g/dL (ref 3.7–4.7)
Alkaline Phosphatase: 48 IU/L (ref 39–117)
Bilirubin Total: 0.6 mg/dL (ref 0.0–1.2)
Bilirubin, Direct: 0.17 mg/dL (ref 0.00–0.40)
Total Protein: 7 g/dL (ref 6.0–8.5)

## 2018-09-14 NOTE — Progress Notes (Signed)
EPIC Encounter for ICM Monitoring  Patient Name: Kevin Hart is a 73 y.o. male Date: 09/14/2018 Primary Care Physican: Guadlupe Spanish, MD Primary [place] Electrophysiologist: Allred 07/11/2018 Weight: 220 lbs   Spoke with patient.Pt is asymptomatic.    Corvue Thoracic impedancenormal but suggested possible fluid accumulation 6/15 - 6/19.  Impedance did not record from 5/30-6/14.Chanetta Marshall, NP reviewed 5/22 report and noted some PVC's on EGM which may interfere with recording impedance.   Prescribed:Furosemide 40 mg 1 tablet daily.  Labs: 07/29/2018 Creatinine 1.14, BUN 13, Potassium 3.3, Sodium 142, GFR >60 07/27/2018 Creatinine 1.09, BUN 16, Potassium 3.5, Sodium 142, GFR >60  07/26/2018 Creatinine 1.21, BUN 18, Potassium 3.4, Sodium 140, GFR 59->60  A complete set of results can be found in Results Review.  Recommendations:No changes. Encouraged to call for fluid symptoms.  Follow-up plan: ICM clinic phone appointment on7/27/2020.   Copy of ICM check sent to Dr.Allred.   3 month ICM trend: 09/12/2018    1 Year ICM trend:       Rosalene Billings, RN 09/14/2018 12:26 PM

## 2018-09-26 ENCOUNTER — Ambulatory Visit (INDEPENDENT_AMBULATORY_CARE_PROVIDER_SITE_OTHER): Payer: PPO | Admitting: *Deleted

## 2018-09-26 DIAGNOSIS — I255 Ischemic cardiomyopathy: Secondary | ICD-10-CM

## 2018-09-26 LAB — CUP PACEART REMOTE DEVICE CHECK
Date Time Interrogation Session: 20200706154127
Implantable Lead Implant Date: 20160906
Implantable Lead Location: 753860
Implantable Pulse Generator Implant Date: 20160906
Pulse Gen Serial Number: 7257239

## 2018-09-27 DIAGNOSIS — I82409 Acute embolism and thrombosis of unspecified deep veins of unspecified lower extremity: Secondary | ICD-10-CM | POA: Diagnosis not present

## 2018-09-27 DIAGNOSIS — M5136 Other intervertebral disc degeneration, lumbar region: Secondary | ICD-10-CM | POA: Diagnosis not present

## 2018-09-27 DIAGNOSIS — M549 Dorsalgia, unspecified: Secondary | ICD-10-CM | POA: Diagnosis not present

## 2018-09-27 DIAGNOSIS — Z7901 Long term (current) use of anticoagulants: Secondary | ICD-10-CM | POA: Diagnosis not present

## 2018-10-04 ENCOUNTER — Encounter: Payer: Self-pay | Admitting: Cardiology

## 2018-10-04 NOTE — Progress Notes (Signed)
Remote ICD transmission.   

## 2018-10-11 DIAGNOSIS — R251 Tremor, unspecified: Secondary | ICD-10-CM | POA: Diagnosis not present

## 2018-10-11 DIAGNOSIS — Z7901 Long term (current) use of anticoagulants: Secondary | ICD-10-CM | POA: Diagnosis not present

## 2018-10-11 DIAGNOSIS — I82409 Acute embolism and thrombosis of unspecified deep veins of unspecified lower extremity: Secondary | ICD-10-CM | POA: Diagnosis not present

## 2018-10-13 DIAGNOSIS — G4733 Obstructive sleep apnea (adult) (pediatric): Secondary | ICD-10-CM | POA: Diagnosis not present

## 2018-10-17 ENCOUNTER — Ambulatory Visit (INDEPENDENT_AMBULATORY_CARE_PROVIDER_SITE_OTHER): Payer: PPO

## 2018-10-17 DIAGNOSIS — Z95810 Presence of automatic (implantable) cardiac defibrillator: Secondary | ICD-10-CM

## 2018-10-17 DIAGNOSIS — I5022 Chronic systolic (congestive) heart failure: Secondary | ICD-10-CM

## 2018-10-18 NOTE — Progress Notes (Signed)
EPIC Encounter for ICM Monitoring  Patient Name: Kevin Hart is a 73 y.o. male Date: 10/18/2018 Primary Care Physican: Guadlupe Spanish, MD Primary [place] Electrophysiologist: Allred Last Weight: 220 lbs   Spoke with patient.Pt is asymptomatic and says he feels fine.  He reports sometimes he thinks his heart skips a beat.   Advised PVCs may have interfered with fluid levels recording like it did on May's report.    CorvueThoracic impedancestopped recording on 10/02/2018.   Prescribed:Furosemide 40 mg 1 tablet daily.  Labs: 07/29/2018 Creatinine1.14, BUN13, Potassium3.3, [id number], GFR>60 07/27/2018 Creatinine1.09, BUN16, Potassium3.5, Sodium142, GFR>60  07/26/2018 Creatinine1.21, BUN18, Potassium3.4, Sodium140, GFR59->60 A complete set of results can be found in Results Review.  Recommendations:No changes. Encouraged to call for fluid symptoms.  Message sent to device clinic to review 7/27 report and will call back if any recommendations or questions.   Follow-up plan: ICM clinic phone appointment on8/17/2020.   Copy of ICM check sent to Dr.Allred.   3 month ICM trend: 10/17/2018    1 Year ICM trend:       Rosalene Billings, RN 10/18/2018 3:55 PM

## 2018-10-19 DIAGNOSIS — Z7901 Long term (current) use of anticoagulants: Secondary | ICD-10-CM | POA: Diagnosis not present

## 2018-10-19 DIAGNOSIS — I509 Heart failure, unspecified: Secondary | ICD-10-CM | POA: Diagnosis not present

## 2018-10-19 DIAGNOSIS — R251 Tremor, unspecified: Secondary | ICD-10-CM | POA: Diagnosis not present

## 2018-10-19 NOTE — Progress Notes (Signed)
Remote transmission reviewed by device clinic RN Levander Campion and no abnormalities noted.

## 2018-11-02 DIAGNOSIS — I82409 Acute embolism and thrombosis of unspecified deep veins of unspecified lower extremity: Secondary | ICD-10-CM | POA: Diagnosis not present

## 2018-11-02 DIAGNOSIS — I509 Heart failure, unspecified: Secondary | ICD-10-CM | POA: Diagnosis not present

## 2018-11-02 DIAGNOSIS — Z7901 Long term (current) use of anticoagulants: Secondary | ICD-10-CM | POA: Diagnosis not present

## 2018-11-02 DIAGNOSIS — M5136 Other intervertebral disc degeneration, lumbar region: Secondary | ICD-10-CM | POA: Diagnosis not present

## 2018-11-07 ENCOUNTER — Ambulatory Visit (INDEPENDENT_AMBULATORY_CARE_PROVIDER_SITE_OTHER): Payer: PPO

## 2018-11-07 DIAGNOSIS — Z95810 Presence of automatic (implantable) cardiac defibrillator: Secondary | ICD-10-CM

## 2018-11-07 DIAGNOSIS — I5022 Chronic systolic (congestive) heart failure: Secondary | ICD-10-CM

## 2018-11-09 ENCOUNTER — Telehealth: Payer: Self-pay

## 2018-11-09 ENCOUNTER — Other Ambulatory Visit: Payer: Self-pay | Admitting: Cardiology

## 2018-11-09 NOTE — Telephone Encounter (Signed)
Spoke w/ pt, scheduled him 11/15/18 for DC.

## 2018-11-09 NOTE — Telephone Encounter (Signed)
 *  STAT* If patient is at the pharmacy, call can be transferred to refill team.   1. Which medications need to be refilled? (please list name of each medication and dose if known)sacubitril-valsartan (ENTRESTO) 24-26 MG  2. Which pharmacy/location (including street and city if local pharmacy) is medication to be sent to? [facility]  3. Do they need a 30 day or 90 day supply? [place]

## 2018-11-09 NOTE — Telephone Encounter (Signed)
-----   Message from Thompson Grayer, MD sent at 11/09/2018 11:01 AM EDT ----- He is having abnormal tachy "binning" which is limiting his Coreview readings.  Please bring in and have [facility] work with you to fix this.  I think that it would be educational for you both to look at with Aaron Edelman. Thanks! Jeneen Rinks

## 2018-11-10 MED ORDER — ENTRESTO 24-26 MG PO TABS: ORAL_TABLET | ORAL | 0 refills | Status: DC

## 2018-11-10 NOTE — Telephone Encounter (Signed)
Requested Prescriptions   Signed Prescriptions Disp Refills  . sacubitril-valsartan (ENTRESTO) 24-26 MG 60 tablet 0    Sig: TAKE ONE TABLET BY MOUTH TWICE DAILY    Authorizing Provider: Martinique, PETER M    Ordering User: Raelene Bott, BRANDY L

## 2018-11-11 ENCOUNTER — Telehealth: Payer: Self-pay

## 2018-11-11 NOTE — Telephone Encounter (Signed)
Remote ICM transmission received.  Attempted call to patient regarding ICM remote transmission and no answer.  

## 2018-11-11 NOTE — Progress Notes (Signed)
EPIC Encounter for ICM Monitoring  Patient Name: Kevin Hart is a 73 y.o. male Date: 11/11/2018 Primary Care Physican: Guadlupe Spanish, MD Primary [place] Electrophysiologist: Allred Last Weight: 220 lbs   Attempted call to patient and unable to reach.  Transmission reviewed.    CorvueThoracic impedancestopped recording on 10/02/2018 through 7/27 and then stopped again on 7/29.  Discussed CorVue with St Jude rep Sports administrator and he consulted with Dr Rayann Heman  Prescribed:Furosemide 40 mg 1 tablet daily.  Labs: 07/29/2018 Creatinine1.14, BUN13, Potassium3.3, [id number], GFR>60 07/27/2018 Creatinine1.09, BUN16, Potassium3.5, Sodium142, GFR>60  07/26/2018 Creatinine1.21, BUN18, Potassium3.4, Sodium140, GFR59->60 A complete set of results can be found in Results Review.  Recommendations: Patient will be seen in device clinic 8/25 to review programming.   Follow-up plan: ICM clinic phone appointment on10/08/2018.  Next 91 day device clinic transmission scheduled 12/26/2018  Copy of ICM check sent to Dr.Allred.   3 month ICM trend: 11/07/2018    Rosalene Billings, RN 11/11/2018 4:05 PM

## 2018-11-15 ENCOUNTER — Other Ambulatory Visit: Payer: Self-pay

## 2018-11-15 ENCOUNTER — Telehealth: Payer: Self-pay | Admitting: Cardiology

## 2018-11-15 ENCOUNTER — Telehealth: Payer: Self-pay

## 2018-11-15 ENCOUNTER — Ambulatory Visit (INDEPENDENT_AMBULATORY_CARE_PROVIDER_SITE_OTHER): Payer: PPO

## 2018-11-15 DIAGNOSIS — I255 Ischemic cardiomyopathy: Secondary | ICD-10-CM | POA: Diagnosis not present

## 2018-11-15 NOTE — Telephone Encounter (Signed)

## 2018-11-15 NOTE — Telephone Encounter (Signed)
COVID-19 Pre-Screening Questions:   In the past 7 to 10 days have you had a cough, shortness of breath, headache, congestion, fever (100 or greater) body aches, chills, sore throat, or sudden loss of taste or sense of smell?  no  Have you been around anyone with known Covid 19.  no  Have you been around anyone who is awaiting Covid 19 test results in the past 7 to 10 days?  no  Have you been around anyone who has been exposed to Covid 19, or has mentioned symptoms of Covid 19 within the past 7 to 10 days?  No  Patient is aware to wear a mask and that we are not allowing family members up unless there is a need.   If you have any concerns/questions about symptoms patients report during screening (either on the phone or at threshold). Contact the provider seeing the patient or DOD for further guidance.  If neither are available contact a member of the leadership team.

## 2018-11-16 ENCOUNTER — Telehealth: Payer: Self-pay

## 2018-11-16 LAB — CUP PACEART INCLINIC DEVICE CHECK
Battery Remaining Longevity: 67 mo
Brady Statistic RV Percent Paced: 0.09 %
Date Time Interrogation Session: 20200825183224
HighPow Impedance: 77.625
Implantable Lead Implant Date: 20160906
Implantable Lead Location: 753860
Implantable Pulse Generator Implant Date: 20160906
Lead Channel Impedance Value: 450 Ohm
Lead Channel Pacing Threshold Amplitude: 0.75 V
Lead Channel Pacing Threshold Pulse Width: 0.5 ms
Lead Channel Sensing Intrinsic Amplitude: 11.8 mV
Lead Channel Setting Pacing Amplitude: 2.5 V
Lead Channel Setting Pacing Pulse Width: 0.5 ms
Lead Channel Setting Sensing Sensitivity: 0.5 mV
Pulse Gen Serial Number: 7257239

## 2018-11-16 NOTE — Telephone Encounter (Signed)
ICM call to patient.  Discussed device programming that was completed in office 11/15/2018 which allows Corvue to record.  ICM direct trend shows Corvue started recording and at baseline.  Advised next remote transmission is scheduled for 12/27/2018 and if he to call if he experiences any fluid symptoms.  He said he is feeling fine and will be taking his monitor with him to the beach for September and October.

## 2018-11-16 NOTE — Progress Notes (Signed)
Impedance trends stable over time. No evidence of any ventricular arrhythmias. No mode switches. Histogram distribution appropriate for patient and level of activity. Per ST Jude rep VT 1 Zone programmed to allow corvue diagnostics to be recorded due to binning of PVCs. No change in programming that provides therapy.Device programmed at appropriate safety margins. Device programmed to optimize intrinsic conduction. Estimated longevity 5 yrs 7 months. Pt enrolled in remote follow-up and next f/u remote 12/26/18. Plan to check device every 3 months remotely and in office annually. Patient education completed including shock plan.

## 2018-11-18 DIAGNOSIS — I10 Essential (primary) hypertension: Secondary | ICD-10-CM | POA: Diagnosis not present

## 2018-11-18 DIAGNOSIS — I509 Heart failure, unspecified: Secondary | ICD-10-CM | POA: Diagnosis not present

## 2018-11-18 DIAGNOSIS — M5136 Other intervertebral disc degeneration, lumbar region: Secondary | ICD-10-CM | POA: Diagnosis not present

## 2018-11-18 DIAGNOSIS — G4733 Obstructive sleep apnea (adult) (pediatric): Secondary | ICD-10-CM | POA: Diagnosis not present

## 2018-12-05 NOTE — Progress Notes (Signed)
Virtual Visit via Telephone Note   This visit type was conducted due to national recommendations for restrictions regarding the COVID-19 Pandemic (e.g. social distancing) in an effort to limit this patient's exposure and mitigate transmission in our community.  Due to his co-morbid illnesses, this patient is at least at moderate risk for complications without adequate follow up.  This format is felt to be most appropriate for this patient at this time.  The patient did not have access to video technology/had technical difficulties with video requiring transitioning to audio format only (telephone).  All issues noted in this document were discussed and addressed.  No physical exam could be performed with this format.  Please refer to the patient's chart for his  consent to telehealth for [facility].   Date:  12/08/2018   ID:  Kevin Hart, DOB 03-05-1946, MRN [id number]  Patient Location: Other:  at wife's doctor's appointment Provider Location: Home  PCP:  Guadlupe Spanish, MD  Cardiologist:  Jaylissa Felty Martinique, MD  Electrophysiologist:  Thompson Grayer, MD   Evaluation Performed:  Follow-Up Visit  Chief Complaint:  CAD  History of Present Illness:    Kevin Hart is a 73 y.o. male with PMH of CAD, h/o DVT on coumadin (positive lupus anticoagulant), chronic systolic heart failure, hypertension, history of St Jude single-chamber ICD in 2016 for ICM, and obstructive sleep apnea.  He was admitted in April 2016 and underwent DES to LAD for in-stent restenosis.  Cardiac catheterization showed a 90% mid LAD lesion, EF 25 to 30%.  Repeat echocardiogram by May 2017 showed EF 30 to 35%.  He subsequently underwent ICD implantation by Dr. Rayann Heman in September 2016.  Myoview obtained in July 2018 showed EF 34%, large defect of moderate severity present in the basal inferior, mid anterior, mid inferoseptal, mid inferior, apical anterior, apical septal and apical inferior location most consistent with  previous scar however has very mild peri-infarct ischemia.  Echocardiogram was repeated in May 2019 which showed EF 35 to 40%, mild LVH, mild AI, trivial MR.  He was diagnosed with right hemorrhagic renal cyst in August 2019.  Plavix was discontinued at the time and Coumadin was restarted after 1 week.  He was admitted in December 2019 was bladder outlet obstruction due to BPH.  TURP procedure was performed.  More recently, patient presented to the hospital on 07/27/2018 with chest pain.  Cardiac catheterization performed on 07/29/2018 showed mild in-stent restenosis in LAD measuring up to 30%, widely patent RCA and left circumflex, normal LVEDP.  The chest pain was felt to be noncardiac in origin. ICD check on August 25 was satisfactory.  He is no longer taking ASA. He would like to reduce Crestor dose back to 20. He is experiencing muscle weakness and finds it harder to get up out of a chair now. Has intermittent chest pain at rest. Doesn't last long. Hasn't had to use Ntg. Weight is stable. No dyspnea or edema.  The patient does not have symptoms concerning for COVID-19 infection (fever, chills, cough, or new shortness of breath).    Past Medical History:  Diagnosis Date  . Arthritis   . Bladder stone 10/26/2017  . BPH (benign prostatic hyperplasia)    takes Flomax daily  . CAD (coronary artery disease)    a. prior LAD stenting;  b. 06/2014 NSTEMI/PCI: LM nl, LAD 30p, 29m ISR (3.0x20 Promus DES), LCX nl, RCA nondom, nl, EF 25-30% ant, apical, dist inf AK.  . Cataracts, bilateral  immature  . Chronic systolic CHF (congestive heart failure) ([place]) 12/31/2014  . DVT (deep venous thrombosis) ([facility])    RECURRENT left leg  . Dyslipidemia   . GERD (gastroesophageal reflux disease)   . Gout   . History of kidney stones   . Hypertension   . Ischemic cardiomyopathy 07/06/2014  . Lupus anticoagulant positive   . Myocardial infarct ([place]) 2000  . Myocardial infarct ([place]) 2016  . S/P drug eluting  coronary stent placement, 07/06/14, Promus 07/07/2014  . Sleep apnea    biPaP  . Subarachnoid bleed ([employer]) 2015   after falling and hitting his head   Past Surgical History:  Procedure Laterality Date  . COLONOSCOPY    . EP IMPLANTABLE DEVICE [name] 11/27/2014   St. Jude Medical Fortify Assura VR  implanted by Dr Rayann Heman for primary prevention  . LEFT HEART CATH AND CORONARY ANGIOGRAPHY [name] 07/29/2018   Procedure: LEFT HEART CATH AND CORONARY ANGIOGRAPHY;  Surgeon: Sherren Mocha, MD;  Location: [facility];  Service: Cardiovascular;  Laterality: [name];  . LEFT HEART CATHETERIZATION WITH CORONARY ANGIOGRAM [name] 07/06/2014   Procedure: LEFT HEART CATHETERIZATION WITH CORONARY ANGIOGRAM;  Surgeon: Hadrian Yarbrough M Martinique, MD;  Location: [facility] C[facility];  Service: Cardiovascular;  Laterality: [name];  . PERCUTANEOUS CORONARY STENT INTERVENTION (PCI-S) Right 07/06/2014   Procedure: PERCUTANEOUS CORONARY STENT INTERVENTION (PCI-S);  Surgeon: Meaghann Choo M Martinique, MD;  Location: [facility] CATH LAB;  Service: Cardiovascular;  Laterality: Right;  . SHOULDER ARTHROSCOPY WITH SUBACROMIAL DECOMPRESSION Left 03/05/2016   Procedure: SHOULDER ARTHROSCOPY ROTATOR CUFF REPAIR WITH SUBACROMIAL DECOMPRESSION;  Surgeon: Tania Ade, MD;  Location: [facility];  Service: Orthopedics;  Laterality: Left;  SHOULDER ARTHROSCOPY ROTATOR CUFF REPAIR WITH SUBACROMIAL DECOMPRESSION  . SHOULDER CLOSED REDUCTION Left 11/29/2015   Procedure: CLOSED REDUCTION SHOULDER;  Surgeon: Tania Ade, MD;  Location: [facility];  Service: Orthopedics;  Laterality: Left;  . TRANSURETHRAL RESECTION OF PROSTATE [name] 02/24/2018   Procedure: TRANSURETHRAL RESECTION OF THE PROSTATE (TURP);  Surgeon: Irine Seal, MD;  Location: [facility];  Service: Urology;  Laterality: [name];  . WRIST SURGERY Bilateral    plates and screws      Current Meds  Medication Sig  . allopurinol (ZYLOPRIM) 300 MG tablet Take 300 mg by mouth daily.   . furosemide (LASIX) 40 MG tablet Take 40 mg by mouth  daily.   . Multiple Vitamin (MULTI-VITAMINS) TABS Take 1 tablet by mouth daily.   . nitroGLYCERIN (NITROSTAT) 0.4 MG SL tablet Place 1 tablet (0.4 mg total) under the tongue every 5 (five) minutes x 3 doses as needed for chest pain.  . pantoprazole (PROTONIX) 40 MG tablet Take 1 tablet (40 mg total) by mouth daily.  . sacubitril-valsartan (ENTRESTO) 24-26 MG TAKE ONE TABLET BY MOUTH TWICE DAILY  . warfarin (COUMADIN) 5 MG tablet Take 0.5-1 tablets (2.5-5 mg total) by mouth See admin instructions. Take 5 mg by mouth on Tuesday, Wednesday, Thursday, Saturday and Sunday then take 2.5 mg by mouth on Monday and Friday (Patient taking differently: Take 5 mg by mouth See admin instructions. Take 5 mg everyday except Monday and Thursday.)     Allergies:   Patient has no known allergies.   Social History   Tobacco Use  . Smoking status: Never Smoker  . Smokeless tobacco: Never Used  Substance Use Topics  . Alcohol use: Yes    Alcohol/week: 0.0 standard drinks    Comment: seldom   . Drug use: No     Family Hx: The  patient's family history includes Aneurysm in his father.  ROS:   Please see the history of present illness.    All other systems reviewed and are negative.   Prior CV studies:   The following studies were reviewed today:  Cath 07/29/2018  Prox LAD to Mid LAD lesion is 30% stenosed.  LV end diastolic pressure is normal.  1. Mild in-stent restenosis in the LAD 2. Widely patent dominant LCx 3. Patent, nondominant RCA 4. Normal LVEDP  Suspect noncardiac chest pain. Resume warfarin today. Continue medical therapy for secondary risk reduction. OK for discharge home today from cardiac perspective.    Labs/Other Tests and Data Reviewed:    EKG:  No ECG reviewed.  Recent Labs: 07/27/2018: Magnesium 2.0 07/29/2018: BUN 13; Creatinine, Ser 1.14; Hemoglobin 13.5; Platelets 157; Potassium 3.3; Sodium 142 09/14/2018: ALT 21   Recent Lipid Panel Lab Results  Component Value  Date/Time   CHOL 133 09/14/2018 08:37 AM   TRIG 123 09/14/2018 08:37 AM   HDL 49 09/14/2018 08:37 AM   CHOLHDL 2.7 09/14/2018 08:37 AM   CHOLHDL 4 07/25/2014 10:29 AM   LDLCALC 59 09/14/2018 08:37 AM    Wt Readings from Last 3 Encounters:  08/12/18 217 lb (98.4 kg)  07/29/18 222 lb 4.8 oz (100.8 kg)  05/02/18 226 lb 12.8 oz (102.9 kg)     Objective:    Vital Signs:  There were no vitals taken for this visit.   VITAL SIGNS:  reviewed  ASSESSMENT & PLAN:    1. Atypical chest pain: This is chronic for him and it does not occur with exertion.  Recent cardiac catheterization in May was reassuring.  2. CAD: Very mild in-stent restenosis in the LAD on recent cardiac catheterization in May 2020.    3. History of DVT: Positive lupus anticoagulant.  On Coumadin  4. Chronic systolic heart failure: Euvolemic based on symptoms. Medication refilled.   5.  History of ischemic cardiomyopathy status post ICD: Managed by Dr. Rayann Heman  6.   Hyperlipidemia. Last LDL 69. We will reduce Crestor to 20 mg given concerns about muscle weakness. He is having labs drawn next week with primary care. Will ask that they add a CPK level.  COVID-19 Education: The signs and symptoms of COVID-19 were discussed with the patient and how to seek care for testing (follow up with PCP or arrange E-visit).  The importance of social distancing was discussed today.  Time:   Today, I have spent 10 minutes with the patient with telehealth technology discussing the above problems.     Medication Adjustments/Labs and Tests Ordered: Current medicines are reviewed at length with the patient today.  Concerns regarding medicines are outlined above.   Tests Ordered: No orders of the defined types were placed in this encounter.   Medication Changes: No orders of the defined types were placed in this encounter.   Follow Up:  In Person in 6 month(s)  Signed, Nyari Olsson Martinique, MD  12/08/2018 8:36 AM    [place]

## 2018-12-08 ENCOUNTER — Encounter: Payer: Self-pay | Admitting: Cardiology

## 2018-12-08 ENCOUNTER — Telehealth (INDEPENDENT_AMBULATORY_CARE_PROVIDER_SITE_OTHER): Payer: PPO | Admitting: Cardiology

## 2018-12-08 DIAGNOSIS — M791 Myalgia, unspecified site: Secondary | ICD-10-CM

## 2018-12-08 DIAGNOSIS — I255 Ischemic cardiomyopathy: Secondary | ICD-10-CM

## 2018-12-08 DIAGNOSIS — I2510 Atherosclerotic heart disease of native coronary artery without angina pectoris: Secondary | ICD-10-CM

## 2018-12-08 DIAGNOSIS — I5022 Chronic systolic (congestive) heart failure: Secondary | ICD-10-CM

## 2018-12-08 DIAGNOSIS — Z9989 Dependence on other enabling machines and devices: Secondary | ICD-10-CM

## 2018-12-08 DIAGNOSIS — E785 Hyperlipidemia, unspecified: Secondary | ICD-10-CM

## 2018-12-08 DIAGNOSIS — G4733 Obstructive sleep apnea (adult) (pediatric): Secondary | ICD-10-CM

## 2018-12-08 MED ORDER — ROSUVASTATIN CALCIUM 20 MG PO TABS: 20.0000 mg | ORAL_TABLET | Freq: Every day | ORAL | 3 refills | Status: DC

## 2018-12-08 MED ORDER — NITROGLYCERIN 0.4 MG SL SUBL: 0.4000 mg | SUBLINGUAL_TABLET | SUBLINGUAL | 11 refills | Status: AC | PRN

## 2018-12-08 MED ORDER — ENTRESTO 24-26 MG PO TABS: ORAL_TABLET | ORAL | 3 refills | Status: DC

## 2018-12-08 NOTE — Addendum Note (Signed)
Addended by: Kathyrn Lass on: 12/08/2018 09:44 AM   Modules accepted: Orders

## 2018-12-08 NOTE — Patient Instructions (Addendum)
Reduce Crestor to 20 mg daily  CPK blood test  to be done at [facility] next week   Lab order enclosed   Schedule follow up appointment in 6 months    Call in Dec to schedule March appointment

## 2018-12-09 ENCOUNTER — Other Ambulatory Visit: Payer: Self-pay

## 2018-12-09 DIAGNOSIS — I5022 Chronic systolic (congestive) heart failure: Secondary | ICD-10-CM

## 2018-12-09 DIAGNOSIS — M791 Myalgia, unspecified site: Secondary | ICD-10-CM

## 2018-12-09 DIAGNOSIS — I2510 Atherosclerotic heart disease of native coronary artery without angina pectoris: Secondary | ICD-10-CM

## 2018-12-09 DIAGNOSIS — I255 Ischemic cardiomyopathy: Secondary | ICD-10-CM

## 2018-12-09 DIAGNOSIS — G4733 Obstructive sleep apnea (adult) (pediatric): Secondary | ICD-10-CM

## 2018-12-09 DIAGNOSIS — E785 Hyperlipidemia, unspecified: Secondary | ICD-10-CM

## 2018-12-14 DIAGNOSIS — E7800 Pure hypercholesterolemia, unspecified: Secondary | ICD-10-CM | POA: Diagnosis not present

## 2018-12-14 DIAGNOSIS — Z1159 Encounter for screening for other viral diseases: Secondary | ICD-10-CM | POA: Diagnosis not present

## 2018-12-14 DIAGNOSIS — R5383 Other fatigue: Secondary | ICD-10-CM | POA: Diagnosis not present

## 2018-12-14 DIAGNOSIS — M129 Arthropathy, unspecified: Secondary | ICD-10-CM | POA: Diagnosis not present

## 2018-12-14 DIAGNOSIS — Z79899 Other long term (current) drug therapy: Secondary | ICD-10-CM | POA: Diagnosis not present

## 2018-12-14 DIAGNOSIS — I2510 Atherosclerotic heart disease of native coronary artery without angina pectoris: Secondary | ICD-10-CM | POA: Diagnosis not present

## 2018-12-14 DIAGNOSIS — Z7901 Long term (current) use of anticoagulants: Secondary | ICD-10-CM | POA: Diagnosis not present

## 2018-12-14 DIAGNOSIS — E559 Vitamin D deficiency, unspecified: Secondary | ICD-10-CM | POA: Diagnosis not present

## 2018-12-14 DIAGNOSIS — I10 Essential (primary) hypertension: Secondary | ICD-10-CM | POA: Diagnosis not present

## 2018-12-14 DIAGNOSIS — M791 Myalgia, unspecified site: Secondary | ICD-10-CM | POA: Diagnosis not present

## 2018-12-14 DIAGNOSIS — E291 Testicular hypofunction: Secondary | ICD-10-CM | POA: Diagnosis not present

## 2018-12-14 DIAGNOSIS — Z23 Encounter for immunization: Secondary | ICD-10-CM | POA: Diagnosis not present

## 2018-12-14 DIAGNOSIS — I509 Heart failure, unspecified: Secondary | ICD-10-CM | POA: Diagnosis not present

## 2018-12-14 DIAGNOSIS — D539 Nutritional anemia, unspecified: Secondary | ICD-10-CM | POA: Diagnosis not present

## 2018-12-15 DIAGNOSIS — D235 Other benign neoplasm of skin of trunk: Secondary | ICD-10-CM | POA: Diagnosis not present

## 2018-12-15 DIAGNOSIS — Z85828 Personal history of other malignant neoplasm of skin: Secondary | ICD-10-CM | POA: Diagnosis not present

## 2018-12-15 DIAGNOSIS — C4441 Basal cell carcinoma of skin of scalp and neck: Secondary | ICD-10-CM | POA: Diagnosis not present

## 2018-12-15 DIAGNOSIS — L570 Actinic keratosis: Secondary | ICD-10-CM | POA: Diagnosis not present

## 2018-12-15 DIAGNOSIS — Z08 Encounter for follow-up examination after completed treatment for malignant neoplasm: Secondary | ICD-10-CM | POA: Diagnosis not present

## 2018-12-15 DIAGNOSIS — L578 Other skin changes due to chronic exposure to nonionizing radiation: Secondary | ICD-10-CM | POA: Diagnosis not present

## 2018-12-26 ENCOUNTER — Ambulatory Visit (INDEPENDENT_AMBULATORY_CARE_PROVIDER_SITE_OTHER): Payer: PPO | Admitting: *Deleted

## 2018-12-26 DIAGNOSIS — I5022 Chronic systolic (congestive) heart failure: Secondary | ICD-10-CM

## 2018-12-26 DIAGNOSIS — I255 Ischemic cardiomyopathy: Secondary | ICD-10-CM

## 2018-12-27 ENCOUNTER — Ambulatory Visit (INDEPENDENT_AMBULATORY_CARE_PROVIDER_SITE_OTHER): Payer: PPO

## 2018-12-27 DIAGNOSIS — Z95810 Presence of automatic (implantable) cardiac defibrillator: Secondary | ICD-10-CM

## 2018-12-27 DIAGNOSIS — I5022 Chronic systolic (congestive) heart failure: Secondary | ICD-10-CM | POA: Diagnosis not present

## 2018-12-27 LAB — CUP PACEART REMOTE DEVICE CHECK
Battery Remaining Longevity: 66 mo
Battery Remaining Percentage: 62 %
Battery Voltage: 2.99 V
Brady Statistic RV Percent Paced: 1 %
Date Time Interrogation Session: 20201005060021
HighPow Impedance: 78 Ohm
HighPow Impedance: 78 Ohm
Implantable Lead Implant Date: 20160906
Implantable Lead Location: 753860
Implantable Pulse Generator Implant Date: 20160906
Lead Channel Impedance Value: 480 Ohm
Lead Channel Pacing Threshold Amplitude: 0.75 V
Lead Channel Pacing Threshold Pulse Width: 0.5 ms
Lead Channel Sensing Intrinsic Amplitude: 11.8 mV
Lead Channel Setting Pacing Amplitude: 2.5 V
Lead Channel Setting Pacing Pulse Width: 0.5 ms
Lead Channel Setting Sensing Sensitivity: 0.5 mV
Pulse Gen Serial Number: 7257239

## 2018-12-27 NOTE — Progress Notes (Signed)
EPIC Encounter for ICM Monitoring  Patient Name: Kevin Hart is a 73 y.o. male Date: 12/27/2018 Primary Care Physican: Guadlupe Spanish, MD Primary [place] Electrophysiologist: Allred Last Weight:220 lbs   Spoke with patient.  He reports feeling good and no fluid symptoms.  He is currently at the beach.  No fluid symptoms during decreased impedance.  CorvueThoracic impedancenormal but was suggestive of possible fluid accumulation 9/23-10/1.  Prescribed:Furosemide 40 mg 1 tablet daily.  Labs: 07/29/2018 Creatinine1.14, BUN13, Potassium3.3, [id number], GFR>60 07/27/2018 Creatinine1.09, BUN16, Potassium3.5, Sodium142, GFR>60  07/26/2018 Creatinine1.21, BUN18, Potassium3.4, Sodium140, GFR59->60 A complete set of results can be found in Results Review.  Recommendations: No changes and encouraged to call if experiencing any fluid symptoms.  Follow-up plan: ICM clinic phone appointment on11/11/2018.    Copy of ICM check sent to Dr.Allred.  3 month ICM trend: 12/26/2018    1 Year ICM trend:       Rosalene Billings, RN 12/27/2018 3:03 PM

## 2019-01-02 NOTE — Progress Notes (Signed)
Remote ICD transmission.   

## 2019-01-30 ENCOUNTER — Ambulatory Visit (INDEPENDENT_AMBULATORY_CARE_PROVIDER_SITE_OTHER): Payer: PPO

## 2019-01-30 DIAGNOSIS — Z95810 Presence of automatic (implantable) cardiac defibrillator: Secondary | ICD-10-CM | POA: Diagnosis not present

## 2019-01-30 DIAGNOSIS — I5022 Chronic systolic (congestive) heart failure: Secondary | ICD-10-CM | POA: Diagnosis not present

## 2019-01-31 NOTE — Progress Notes (Signed)
EPIC Encounter for ICM Monitoring  Patient Name: Kevin Hart is a 73 y.o. male Date: 01/31/2019 Primary Care Physican: Guadlupe Spanish, MD Primary [place] Electrophysiologist: Allred Last Weight:220 lbs   Spoke with patient.  He reports feeling good and no fluid symptoms and still at the beach.  No fluid symptoms during decreased impedance.  CorvueThoracic impedancenormal but was suggestive of possible fluid accumulation 10/24 - 11/1.  Prescribed:Furosemide 40 mg 1 tablet daily.  Labs: 07/29/2018 Creatinine1.14, BUN13, Potassium3.3, [id number], GFR>60 07/27/2018 Creatinine1.09, BUN16, Potassium3.5, Sodium142, GFR>60  07/26/2018 Creatinine1.21, BUN18, Potassium3.4, Sodium140, GFR59->60 A complete set of results can be found in Results Review.  Recommendations:   No changes and encouraged to call if experiencing any fluid symptoms.  Follow-up plan: ICM clinic phone appointment on 03/06/2019.   91 day device clinic remote transmission 03/27/2019.     Copy of ICM check sent to Dr. Rayann Heman.   3 month ICM trend: 01/30/2019    1 Year ICM trend:       Rosalene Billings, RN 01/31/2019 4:33 PM

## 2019-02-27 DIAGNOSIS — I10 Essential (primary) hypertension: Secondary | ICD-10-CM | POA: Diagnosis not present

## 2019-02-27 DIAGNOSIS — Z1159 Encounter for screening for other viral diseases: Secondary | ICD-10-CM | POA: Diagnosis not present

## 2019-02-27 DIAGNOSIS — M5136 Other intervertebral disc degeneration, lumbar region: Secondary | ICD-10-CM | POA: Diagnosis not present

## 2019-02-27 DIAGNOSIS — M545 Low back pain: Secondary | ICD-10-CM | POA: Diagnosis not present

## 2019-02-27 DIAGNOSIS — Z7901 Long term (current) use of anticoagulants: Secondary | ICD-10-CM | POA: Diagnosis not present

## 2019-03-01 DIAGNOSIS — M5136 Other intervertebral disc degeneration, lumbar region: Secondary | ICD-10-CM | POA: Diagnosis not present

## 2019-03-06 ENCOUNTER — Ambulatory Visit (INDEPENDENT_AMBULATORY_CARE_PROVIDER_SITE_OTHER): Payer: PPO

## 2019-03-06 DIAGNOSIS — Z95810 Presence of automatic (implantable) cardiac defibrillator: Secondary | ICD-10-CM | POA: Diagnosis not present

## 2019-03-06 DIAGNOSIS — I5022 Chronic systolic (congestive) heart failure: Secondary | ICD-10-CM | POA: Diagnosis not present

## 2019-03-07 NOTE — Progress Notes (Signed)
Patient returned call and says he is feeling well.  No fluid accumulation symptoms.  Transmission reviewed and encouraged to limit salt intake.  Will recheck fluid levels 03/15/2019.

## 2019-03-07 NOTE — Progress Notes (Signed)
EPIC Encounter for ICM Monitoring  Patient Name: Kevin Hart is a 73 y.o. male Date: 03/07/2019 Primary Care Physican: Guadlupe Spanish, MD Primary [place] Electrophysiologist: Allred Last Weight:220 lbs   Attempted call to patient and unable to reach.  Left detailed message per DPR regarding transmission. Transmission reviewed.   CorvueThoracic impedance suggestive of possible fluid accumulation since 03/03/2020.  Prescribed:Furosemide 40 mg 1 tablet daily.  Labs: 07/29/2018 Creatinine1.14, BUN13, Potassium3.3, [id number], GFR>60 07/27/2018 Creatinine1.09, BUN16, Potassium3.5, Sodium142, GFR>60  07/26/2018 Creatinine1.21, BUN18, Potassium3.4, Sodium140, GFR59->60 A complete set of results can be found in Results Review.  Recommendations:  Left voice mail with ICM number and encouraged to call if experiencing any fluid symptoms.  Follow-up plan: ICM clinic phone appointment on 03/15/2019.   91 day device clinic remote transmission 03/27/2019.     Copy of ICM check sent to Dr. Rayann Heman.   3 month ICM trend: 03/06/2019    1 Year ICM trend:       Rosalene Billings, RN 03/07/2019 3:08 PM

## 2019-03-15 ENCOUNTER — Ambulatory Visit (INDEPENDENT_AMBULATORY_CARE_PROVIDER_SITE_OTHER): Payer: PPO

## 2019-03-15 DIAGNOSIS — I5022 Chronic systolic (congestive) heart failure: Secondary | ICD-10-CM

## 2019-03-15 DIAGNOSIS — Z95810 Presence of automatic (implantable) cardiac defibrillator: Secondary | ICD-10-CM

## 2019-03-15 NOTE — Progress Notes (Signed)
EPIC Encounter for ICM Monitoring  Patient Name: Kevin Hart is a 73 y.o. male Date: 03/15/2019 Primary Care Physican: Guadlupe Spanish, MD Primary [place] Electrophysiologist: Allred Last Weight:220 lbs   Spoke with patient and he is feeling well.     CorvueThoracic impedance returned to normal since 03/06/2019 transmission.  Prescribed:Furosemide 40 mg 1 tablet daily.  Labs: 07/29/2018 Creatinine1.14, BUN13, Potassium3.3, [id number], GFR>60 07/27/2018 Creatinine1.09, BUN16, Potassium3.5, Sodium142, GFR>60  07/26/2018 Creatinine1.21, BUN18, Potassium3.4, Sodium140, GFR59->60 A complete set of results can be found in Results Review.  Recommendations:No changes and encouraged to call if experiencing any fluid symptoms.  Follow-up plan: ICM clinic phone appointment on1/25/2021. 91 day device clinic remote transmission 03/27/2019.   3 month ICM trend: 03/15/2019    1 Year ICM trend:       Rosalene Billings, RN 03/15/2019 9:09 AM

## 2019-03-27 ENCOUNTER — Ambulatory Visit (INDEPENDENT_AMBULATORY_CARE_PROVIDER_SITE_OTHER): Payer: PPO | Admitting: *Deleted

## 2019-03-27 DIAGNOSIS — I5022 Chronic systolic (congestive) heart failure: Secondary | ICD-10-CM | POA: Diagnosis not present

## 2019-03-27 LAB — CUP PACEART REMOTE DEVICE CHECK
Date Time Interrogation Session: 20210104063140
Implantable Lead Implant Date: 20160906
Implantable Lead Location: 753860
Implantable Pulse Generator Implant Date: 20160906
Pulse Gen Serial Number: 7257239

## 2019-03-29 DIAGNOSIS — G4733 Obstructive sleep apnea (adult) (pediatric): Secondary | ICD-10-CM | POA: Diagnosis not present

## 2019-03-30 DIAGNOSIS — M129 Arthropathy, unspecified: Secondary | ICD-10-CM | POA: Diagnosis not present

## 2019-03-30 DIAGNOSIS — Z1159 Encounter for screening for other viral diseases: Secondary | ICD-10-CM | POA: Diagnosis not present

## 2019-03-30 DIAGNOSIS — M545 Low back pain: Secondary | ICD-10-CM | POA: Diagnosis not present

## 2019-03-30 DIAGNOSIS — M5136 Other intervertebral disc degeneration, lumbar region: Secondary | ICD-10-CM | POA: Diagnosis not present

## 2019-03-30 DIAGNOSIS — Z79899 Other long term (current) drug therapy: Secondary | ICD-10-CM | POA: Diagnosis not present

## 2019-03-30 DIAGNOSIS — E291 Testicular hypofunction: Secondary | ICD-10-CM | POA: Diagnosis not present

## 2019-03-30 DIAGNOSIS — R300 Dysuria: Secondary | ICD-10-CM | POA: Diagnosis not present

## 2019-03-30 DIAGNOSIS — E7800 Pure hypercholesterolemia, unspecified: Secondary | ICD-10-CM | POA: Diagnosis not present

## 2019-03-30 DIAGNOSIS — D539 Nutritional anemia, unspecified: Secondary | ICD-10-CM | POA: Diagnosis not present

## 2019-03-30 DIAGNOSIS — E559 Vitamin D deficiency, unspecified: Secondary | ICD-10-CM | POA: Diagnosis not present

## 2019-03-30 DIAGNOSIS — Z7901 Long term (current) use of anticoagulants: Secondary | ICD-10-CM | POA: Diagnosis not present

## 2019-03-30 DIAGNOSIS — R5383 Other fatigue: Secondary | ICD-10-CM | POA: Diagnosis not present

## 2019-03-30 DIAGNOSIS — I10 Essential (primary) hypertension: Secondary | ICD-10-CM | POA: Diagnosis not present

## 2019-04-03 DIAGNOSIS — I714 Abdominal aortic aneurysm, without rupture: Secondary | ICD-10-CM | POA: Diagnosis not present

## 2019-04-18 ENCOUNTER — Telehealth: Payer: Self-pay

## 2019-04-18 NOTE — Telephone Encounter (Signed)
Left message for patient to remind of missed remote transmission.  

## 2019-04-20 DIAGNOSIS — S41119A Laceration without foreign body of unspecified upper arm, initial encounter: Secondary | ICD-10-CM | POA: Diagnosis not present

## 2019-04-20 DIAGNOSIS — M25552 Pain in left hip: Secondary | ICD-10-CM | POA: Diagnosis not present

## 2019-04-20 DIAGNOSIS — W19XXXS Unspecified fall, sequela: Secondary | ICD-10-CM | POA: Diagnosis not present

## 2019-04-20 DIAGNOSIS — R300 Dysuria: Secondary | ICD-10-CM | POA: Diagnosis not present

## 2019-04-20 DIAGNOSIS — Z7901 Long term (current) use of anticoagulants: Secondary | ICD-10-CM | POA: Diagnosis not present

## 2019-04-20 DIAGNOSIS — R319 Hematuria, unspecified: Secondary | ICD-10-CM | POA: Diagnosis not present

## 2019-04-24 DIAGNOSIS — R319 Hematuria, unspecified: Secondary | ICD-10-CM | POA: Diagnosis not present

## 2019-04-24 NOTE — Progress Notes (Signed)
No ICM remote transmission received for 04/17/2019 and next ICM transmission scheduled for 05/15/2019.

## 2019-05-01 ENCOUNTER — Ambulatory Visit: Payer: PPO

## 2019-05-04 DIAGNOSIS — Z0000 Encounter for general adult medical examination without abnormal findings: Secondary | ICD-10-CM | POA: Diagnosis not present

## 2019-05-04 DIAGNOSIS — Z7901 Long term (current) use of anticoagulants: Secondary | ICD-10-CM | POA: Diagnosis not present

## 2019-05-04 DIAGNOSIS — D485 Neoplasm of uncertain behavior of skin: Secondary | ICD-10-CM | POA: Diagnosis not present

## 2019-05-04 DIAGNOSIS — L570 Actinic keratosis: Secondary | ICD-10-CM | POA: Diagnosis not present

## 2019-05-04 DIAGNOSIS — D225 Melanocytic nevi of trunk: Secondary | ICD-10-CM | POA: Diagnosis not present

## 2019-05-04 DIAGNOSIS — C4441 Basal cell carcinoma of skin of scalp and neck: Secondary | ICD-10-CM | POA: Diagnosis not present

## 2019-05-04 DIAGNOSIS — C44319 Basal cell carcinoma of skin of other parts of face: Secondary | ICD-10-CM | POA: Diagnosis not present

## 2019-05-04 DIAGNOSIS — L578 Other skin changes due to chronic exposure to nonionizing radiation: Secondary | ICD-10-CM | POA: Diagnosis not present

## 2019-05-04 DIAGNOSIS — L821 Other seborrheic keratosis: Secondary | ICD-10-CM | POA: Diagnosis not present

## 2019-05-04 DIAGNOSIS — Z85828 Personal history of other malignant neoplasm of skin: Secondary | ICD-10-CM | POA: Diagnosis not present

## 2019-05-08 ENCOUNTER — Telehealth: Payer: Self-pay

## 2019-05-09 ENCOUNTER — Telehealth (INDEPENDENT_AMBULATORY_CARE_PROVIDER_SITE_OTHER): Payer: PPO | Admitting: Internal Medicine

## 2019-05-09 ENCOUNTER — Encounter: Payer: Self-pay | Admitting: Internal Medicine

## 2019-05-09 VITALS — BP 100/65 | HR 75 | Ht 72.0 in | Wt 215.0 lb

## 2019-05-09 DIAGNOSIS — Z9989 Dependence on other enabling machines and devices: Secondary | ICD-10-CM | POA: Diagnosis not present

## 2019-05-09 DIAGNOSIS — G4733 Obstructive sleep apnea (adult) (pediatric): Secondary | ICD-10-CM | POA: Diagnosis not present

## 2019-05-09 DIAGNOSIS — I255 Ischemic cardiomyopathy: Secondary | ICD-10-CM | POA: Diagnosis not present

## 2019-05-09 DIAGNOSIS — I5022 Chronic systolic (congestive) heart failure: Secondary | ICD-10-CM

## 2019-05-09 NOTE — Progress Notes (Signed)
Electrophysiology TeleHealth Note   Due to national recommendations of social distancing due to COVID 19, an audio/video telehealth visit is felt to be most appropriate for this patient at this time.  See MyChart message from today for the patient's consent to telehealth for [facility].  Date:  05/09/2019   ID:  Kevin Hart, DOB 04-Jan-1946, MRN [id number]  Location: patient's home  Provider location:  [facility]  Evaluation Performed: Follow-up visit  PCP:  Carlyle Basques, MD   Electrophysiologist:  Dr Rayann Heman  Chief Complaint:  CHF  History of Present Illness:    Kevin Hart is a 74 y.o. male who presents via telehealth conferencing today.  Since last being seen in our clinic, the patient reports doing very well.  His primary concern is with renal failure.  His atypical chest pain is stable.  He has stable venous insufficiency s/p L leg DVT.  Today, he denies symptoms of palpitations, chest pain, shortness of breath,  dizziness, presyncope, or syncope.  The patient is otherwise without complaint today.  The patient denies symptoms of fevers, chills, cough, or new SOB worrisome for COVID 19.  Past Medical History:  Diagnosis Date  . Arthritis   . Bladder stone 10/26/2017  . BPH (benign prostatic hyperplasia)    takes Flomax daily  . CAD (coronary artery disease)    a. prior LAD stenting;  b. 06/2014 NSTEMI/PCI: LM nl, LAD 30p, 32m ISR (3.0x20 Promus DES), LCX nl, RCA nondom, nl, EF 25-30% ant, apical, dist inf AK.  . Cataracts, bilateral    immature  . Chronic systolic CHF (congestive heart failure) ([place]) 12/31/2014  . DVT (deep venous thrombosis) ([facility])    RECURRENT left leg  . Dyslipidemia   . GERD (gastroesophageal reflux disease)   . Gout   . History of kidney stones   . Hypertension   . Ischemic cardiomyopathy 07/06/2014  . Lupus anticoagulant positive   . Myocardial infarct ([place]) 2000  . Myocardial infarct ([place]) 2016  . S/P drug eluting coronary  stent placement, 07/06/14, Promus 07/07/2014  . Sleep apnea    biPaP  . Subarachnoid bleed ([place]) 2015   after falling and hitting his head    Past Surgical History:  Procedure Laterality Date  . COLONOSCOPY    . EP IMPLANTABLE DEVICE [name] 11/27/2014   St. Jude Medical Fortify Assura VR  implanted by Dr Rayann Heman for primary prevention  . LEFT HEART CATH AND CORONARY ANGIOGRAPHY [name] 07/29/2018   Procedure: LEFT HEART CATH AND CORONARY ANGIOGRAPHY;  Surgeon: Sherren Mocha, MD;  Location: [place] CV LAB;  Service: Cardiovascular;  Laterality: [name];  . LEFT HEART CATHETERIZATION WITH CORONARY ANGIOGRAM [name] 07/06/2014   Procedure: LEFT HEART CATHETERIZATION WITH CORONARY ANGIOGRAM;  Surgeon: Peter M Martinique, MD;  Location[facility] CATH LAB;  Service: Cardiovascular;  Laterality: [name];  . PERCUTANEOUS CORONARY STENT INTERVENTION (PCI-S) Right 07/06/2014   Procedure: PERCUTANEOUS CORONARY STENT INTERVENTION (PCI-S);  Surgeon: Peter M Martinique, MD;  Location: [facility] CATH LAB;  Service: Cardiovascular;  Laterality: Right;  . SHOULDER ARTHROSCOPY WITH SUBACROMIAL DECOMPRESSION Left 03/05/2016   Procedure: SHOULDER ARTHROSCOPY ROTATOR CUFF REPAIR WITH SUBACROMIAL DECOMPRESSION;  Surgeon: Tania Ade, MD;  Location: [place];  Service: Orthopedics;  Laterality: Left;  SHOULDER ARTHROSCOPY ROTATOR CUFF REPAIR WITH SUBACROMIAL DECOMPRESSION  . SHOULDER CLOSED REDUCTION Left 11/29/2015   Procedure: CLOSED REDUCTION SHOULDER;  Surgeon: Tania Ade, MD;  Location: [place];  Service: Orthopedics;  Laterality: Left;  . TRANSURETHRAL RESECTION OF  PROSTATE [name] 02/24/2018   Procedure: TRANSURETHRAL RESECTION OF THE PROSTATE (TURP);  Surgeon: Irine Seal, MD;  Location: [facility];  Service: Urology;  Laterality: [name];  . WRIST SURGERY Bilateral    plates and screws     Current Outpatient Medications  Medication Sig Dispense Refill  . allopurinol (ZYLOPRIM) 300 MG tablet Take 300 mg by mouth daily.     . furosemide (LASIX) 40 MG  tablet Take 40 mg by mouth daily.     . Multiple Vitamin (MULTI-VITAMINS) TABS Take 1 tablet by mouth daily.     . nitroGLYCERIN (NITROSTAT) 0.4 MG SL tablet Place 1 tablet (0.4 mg total) under the tongue every 5 (five) minutes x 3 doses as needed for chest pain. 25 tablet 11  . pantoprazole (PROTONIX) 40 MG tablet Take 1 tablet (40 mg total) by mouth daily. 30 tablet 2  . rosuvastatin (CRESTOR) 20 MG tablet Take 1 tablet (20 mg total) by mouth daily. 90 tablet 3  . sacubitril-valsartan (ENTRESTO) 24-26 MG TAKE ONE TABLET BY MOUTH TWICE DAILY 180 tablet 3  . warfarin (COUMADIN) 5 MG tablet Take 0.5-1 tablets (2.5-5 mg total) by mouth See admin instructions. Take 5 mg by mouth on Tuesday, Wednesday, Thursday, Saturday and Sunday then take 2.5 mg by mouth on Monday and Friday     No current facility-administered medications for this visit.    Allergies:   Patient has no known allergies.   Social History:  The patient  reports that he has never smoked. He has never used smokeless tobacco. He reports current alcohol use. He reports that he does not use drugs.   Family History:  The patient's family history includes Aneurysm in his father.   ROS:  Please see the history of present illness.   All other systems are personally reviewed and negative.    Exam:    Vital Signs:  BP 100/65   Pulse 75   Ht 6' (1.829 m)   Wt 215 lb (97.5 kg)   BMI 29.16 kg/m   Well sounding and appearing, alert and conversant, regular work of breathing,  good skin color Eyes- anicteric, neuro- grossly intact, skin- no apparent rash or lesions or cyanosis, mouth- oral mucosa is pink  Labs/Other Tests and Data Reviewed:    Recent Labs: 07/27/2018: Magnesium 2.0 07/29/2018: BUN 13; Creatinine, Ser 1.14; Hemoglobin 13.5; Platelets 157; Potassium 3.3; Sodium 142 09/14/2018: ALT 21   Wt Readings from Last 3 Encounters:  05/09/19 215 lb (97.5 kg)  08/12/18 217 lb (98.4 kg)  07/29/18 222 lb 4.8 oz (100.8 kg)      Last device remote is reviewed from [place] PDF which reveals normal device function, no arrhythmias   ASSESSMENT & PLAN:    1.  Chronic systolic dysfunction/ CAD/ ischemic CM euvolemic No ischemic symptoms Continue current medicines Remotes are uptodate  Normal ICD function Follows in ICM device clinic  2. OSA Uses BiPAP  3. Recurrent DVT/ lupus anticoagulant On coumadin   Follow-up:  [place] Return to see EP NP in a year   Patient Risk:  after full review of this patients clinical status, I feel that they are at moderate risk at this time.  Today, I have spent 15 minutes with the patient with telehealth technology discussing arrhythmia management .    Army Fossa, MD  05/09/2019 12:32 PM     [place] [address] [place] [address] [phone] (office) [phone] (fax)

## 2019-05-11 ENCOUNTER — Ambulatory Visit: Payer: PPO | Admitting: Internal Medicine

## 2019-05-17 ENCOUNTER — Ambulatory Visit (INDEPENDENT_AMBULATORY_CARE_PROVIDER_SITE_OTHER): Payer: PPO

## 2019-05-17 DIAGNOSIS — Z95810 Presence of automatic (implantable) cardiac defibrillator: Secondary | ICD-10-CM | POA: Diagnosis not present

## 2019-05-17 DIAGNOSIS — I5022 Chronic systolic (congestive) heart failure: Secondary | ICD-10-CM | POA: Diagnosis not present

## 2019-05-17 NOTE — Progress Notes (Signed)
EPIC Encounter for ICM Monitoring  Patient Name: Kevin Hart is a 74 y.o. male Date: 05/17/2019 Primary Care Physican: Carlyle Basques, MD Primary [place] Electrophysiologist: Allred Last Weight:220 lbs   Spoke with patient and he is feeling well.     CorvueThoracic impedance normal.  Prescribed:Furosemide 40 mg 1 tablet daily.  Labs: 07/29/2018 Creatinine1.14, BUN13, Potassium3.3, [id number], GFR>60 07/27/2018 Creatinine1.09, BUN16, Potassium3.5, Sodium142, GFR>60  07/26/2018 Creatinine1.21, BUN18, Potassium3.4, Sodium140, GFR59->60 A complete set of results can be found in Results Review.  Recommendations:No changes and encouraged to call if experiencing any fluid symptoms.  Follow-up plan: ICM clinic phone appointment on4/08/2019. 91 day device clinic remote transmission 06/26/2019.  Office visit with Dr Martinique on 06/22/2019.  3 month ICM trend: 05/15/2019    1 Year ICM trend:       Rosalene Billings, RN 05/17/2019 4:40 PM

## 2019-05-18 ENCOUNTER — Ambulatory Visit: Payer: PPO

## 2019-05-19 DIAGNOSIS — I509 Heart failure, unspecified: Secondary | ICD-10-CM | POA: Diagnosis not present

## 2019-05-19 DIAGNOSIS — N2889 Other specified disorders of kidney and ureter: Secondary | ICD-10-CM | POA: Diagnosis not present

## 2019-05-19 DIAGNOSIS — M545 Low back pain: Secondary | ICD-10-CM | POA: Diagnosis not present

## 2019-05-19 DIAGNOSIS — Z20822 Contact with and (suspected) exposure to covid-19: Secondary | ICD-10-CM | POA: Diagnosis not present

## 2019-05-19 DIAGNOSIS — Z7901 Long term (current) use of anticoagulants: Secondary | ICD-10-CM | POA: Diagnosis not present

## 2019-05-19 DIAGNOSIS — Z79899 Other long term (current) drug therapy: Secondary | ICD-10-CM | POA: Diagnosis not present

## 2019-05-22 ENCOUNTER — Other Ambulatory Visit: Payer: Self-pay

## 2019-05-22 ENCOUNTER — Encounter (HOSPITAL_BASED_OUTPATIENT_CLINIC_OR_DEPARTMENT_OTHER): Payer: Self-pay

## 2019-05-22 ENCOUNTER — Emergency Department (HOSPITAL_BASED_OUTPATIENT_CLINIC_OR_DEPARTMENT_OTHER)
Admission: EM | Admit: 2019-05-22 | Discharge: 2019-05-22 | Disposition: A | Discharge status: Home / Self Care | Payer: PPO | Attending: Emergency Medicine | Admitting: Emergency Medicine

## 2019-05-22 ENCOUNTER — Emergency Department (HOSPITAL_BASED_OUTPATIENT_CLINIC_OR_DEPARTMENT_OTHER): Payer: PPO

## 2019-05-22 DIAGNOSIS — S199XXA Unspecified injury of neck, initial encounter: Secondary | ICD-10-CM | POA: Diagnosis not present

## 2019-05-22 DIAGNOSIS — W010XXA Fall on same level from slipping, tripping and stumbling without subsequent striking against object, initial encounter: Secondary | ICD-10-CM | POA: Insufficient documentation

## 2019-05-22 DIAGNOSIS — I5022 Chronic systolic (congestive) heart failure: Secondary | ICD-10-CM | POA: Diagnosis not present

## 2019-05-22 DIAGNOSIS — I2510 Atherosclerotic heart disease of native coronary artery without angina pectoris: Secondary | ICD-10-CM | POA: Diagnosis not present

## 2019-05-22 DIAGNOSIS — Y929 Unspecified place or not applicable: Secondary | ICD-10-CM | POA: Insufficient documentation

## 2019-05-22 DIAGNOSIS — S0990XA Unspecified injury of head, initial encounter: Secondary | ICD-10-CM | POA: Diagnosis not present

## 2019-05-22 DIAGNOSIS — Z23 Encounter for immunization: Secondary | ICD-10-CM | POA: Diagnosis not present

## 2019-05-22 DIAGNOSIS — I110 Hypertensive heart disease with heart failure: Secondary | ICD-10-CM | POA: Insufficient documentation

## 2019-05-22 DIAGNOSIS — I252 Old myocardial infarction: Secondary | ICD-10-CM | POA: Insufficient documentation

## 2019-05-22 DIAGNOSIS — Z9861 Coronary angioplasty status: Secondary | ICD-10-CM | POA: Insufficient documentation

## 2019-05-22 DIAGNOSIS — S0181XA Laceration without foreign body of other part of head, initial encounter: Secondary | ICD-10-CM | POA: Diagnosis not present

## 2019-05-22 DIAGNOSIS — Y999 Unspecified external cause status: Secondary | ICD-10-CM | POA: Insufficient documentation

## 2019-05-22 DIAGNOSIS — W19XXXA Unspecified fall, initial encounter: Secondary | ICD-10-CM

## 2019-05-22 DIAGNOSIS — Y9301 Activity, walking, marching and hiking: Secondary | ICD-10-CM | POA: Diagnosis not present

## 2019-05-22 MED ORDER — LIDOCAINE-EPINEPHRINE 1 %-1:100000 IJ SOLN
10.0000 mL | Freq: Once | INTRAMUSCULAR | Status: AC
  Administered 2019-05-22: 10 mL
  Filled 2019-05-22: qty 1

## 2019-05-22 MED ORDER — TETANUS-DIPHTH-ACELL PERTUSSIS 5-2.5-18.5 LF-MCG/0.5 IM SUSP
0.5000 mL | Freq: Once | INTRAMUSCULAR | Status: AC
  Administered 2019-05-22: 13:00:00 0.5 mL via INTRAMUSCULAR
  Filled 2019-05-22: qty 0.5

## 2019-05-22 NOTE — ED Provider Notes (Signed)
..  Laceration Repair  Date/Time: 05/22/2019 3:21 PM Performed by: Volanda Napoleon, PA-C Authorized by: Volanda Napoleon, PA-C   Consent:    Consent obtained:  Verbal   Consent given by:  Patient   Risks discussed:  Infection, need for additional repair, pain, poor cosmetic result and poor wound healing   Alternatives discussed:  No treatment and delayed treatment Universal protocol:    Procedure explained and questions answered to patient or proxy's satisfaction: yes     Relevant documents present and verified: yes     Test results available and properly labeled: yes     Imaging studies available: yes     Required blood products, implants, devices, and special equipment available: yes     Site/side marked: yes     Immediately prior to procedure, a time out was called: yes     Patient identity confirmed:  Verbally with patient Anesthesia (see MAR for exact dosages):    Anesthesia method:  Local infiltration   Local anesthetic:  Lidocaine 2% WITH epi Laceration details:    Location:  Scalp   Scalp location:  Frontal   Length (cm):  2.5 Repair type:    Repair type:  Intermediate Pre-procedure details:    Preparation:  Patient was prepped and draped in usual sterile fashion Exploration:    Hemostasis achieved with:  Direct pressure   Wound exploration: wound explored through full range of motion     Wound extent: no foreign bodies/material noted   Treatment:    Area cleansed with:  Betadine   Amount of cleaning:  Extensive   Irrigation solution:  Sterile saline   Irrigation method:  Syringe   Visualized foreign bodies/material removed: no   Skin repair:    Repair method:  Sutures   Suture size:  5-0   Suture material:  Nylon   Suture technique:  Simple interrupted   Number of sutures:  5 Approximation:    Approximation:  Close Post-procedure details:    Dressing:  Antibiotic ointment   Patient tolerance of procedure:  Tolerated well, no immediate complications       Volanda Napoleon, PA-C 05/22/19 1522    Maudie Flakes, MD 05/22/19 [phone]

## 2019-05-22 NOTE — Discharge Instructions (Addendum)
You were evaluated in the Emergency Department and after careful evaluation, we did not find any emergent condition requiring admission or further testing in the hospital.  Your exam/testing today is overall reassuring.  Your CT scans did not show any emergencies or significant injuries.  Your sutures will need to be removed in 5 to 7 days by a healthcare professional.  Please return to the Emergency Department if you experience any worsening of your condition.  We encourage you to follow up with a primary care provider.  Thank you for allowing [place] to be a part of your care.

## 2019-05-22 NOTE — ED Provider Notes (Signed)
[facility] Emergency Department Provider Note MRN:  [id number]  Arrival date & time: 05/22/19     Chief Complaint   Fall   History of Present Illness   Kevin Hart is a 74 y.o. year-old male with a history of CAD presenting to the ED with chief complaint of fall.  Patient fell earlier today, was carrying heavy bags and lost his balance and fell forward, struck his face and head against the concrete ground.  No loss of consciousness, no nausea or vomiting, no neck or back pain, no chest pain or shortness of breath, no abdominal pain.  Takes Coumadin daily.  Endorsing mild soreness to the forehead and right hand.  Review of Systems  A complete 10 system review of systems was obtained and all systems are negative except as noted in the HPI and PMH.   Patient's Health History    Past Medical History:  Diagnosis Date  . Arthritis   . Bladder stone 10/26/2017  . BPH (benign prostatic hyperplasia)    takes Flomax daily  . CAD (coronary artery disease)    a. prior LAD stenting;  b. 06/2014 NSTEMI/PCI: LM nl, LAD 30p, 29m ISR (3.0x20 Promus DES), LCX nl, RCA nondom, nl, EF 25-30% ant, apical, dist inf [place].  . Cataracts, bilateral    immature  . Chronic systolic CHF (congestive heart failure) ([place]) 12/31/2014  . DVT (deep venous thrombosis) ([facility])    RECURRENT left leg  . Dyslipidemia   . GERD (gastroesophageal reflux disease)   . Gout   . History of kidney stones   . Hypertension   . Ischemic cardiomyopathy 07/06/2014  . Lupus anticoagulant positive   . Myocardial infarct ([place]) 2000  . Myocardial infarct ([place]) 2016  . S/P drug eluting coronary stent placement, 07/06/14, Promus 07/07/2014  . Sleep apnea    biPaP  . Subarachnoid bleed ([place]) 2015   after falling and hitting his head    Past Surgical History:  Procedure Laterality Date  . COLONOSCOPY    . EP IMPLANTABLE DEVICE [name] 11/27/2014   St. Jude Medical Fortify Assura VR  implanted by Dr  Rayann Heman for primary prevention  . LEFT HEART CATH AND CORONARY ANGIOGRAPHY [name] 07/29/2018   Procedure: LEFT HEART CATH AND CORONARY ANGIOGRAPHY;  Surgeon: Sherren Mocha, MD;  Location: [place] CV LAB;  Service: Cardiovascular;  Laterality: [name];  . LEFT HEART CATHETERIZATION WITH CORONARY ANGIOGRAM [name] 07/06/2014   Procedure: LEFT HEART CATHETERIZATION WITH CORONARY ANGIOGRAM;  Surgeon: Peter M Martinique, MD;  Location: [facility] CATH LAB;  Service: Cardiovascular;  Laterality: [name];  . PERCUTANEOUS CORONARY STENT INTERVENTION (PCI-S) Right 07/06/2014   Procedure: PERCUTANEOUS CORONARY STENT INTERVENTION (PCI-S);  Surgeon: Peter M Martinique, MD;  Location: [facility] CATH LAB;  Service: Cardiovascular;  Laterality: Right;  . SHOULDER ARTHROSCOPY WITH SUBACROMIAL DECOMPRESSION Left 03/05/2016   Procedure: SHOULDER ARTHROSCOPY ROTATOR CUFF REPAIR WITH SUBACROMIAL DECOMPRESSION;  Surgeon: Tania Ade, MD;  Location: [place];  Service: Orthopedics;  Laterality: Left;  SHOULDER ARTHROSCOPY ROTATOR CUFF REPAIR WITH SUBACROMIAL DECOMPRESSION  . SHOULDER CLOSED REDUCTION Left 11/29/2015   Procedure: CLOSED REDUCTION SHOULDER;  Surgeon: Tania Ade, MD;  Location: [place];  Service: Orthopedics;  Laterality: Left;  . TRANSURETHRAL RESECTION OF PROSTATE [name] 02/24/2018   Procedure: TRANSURETHRAL RESECTION OF THE PROSTATE (TURP);  Surgeon: Irine Seal, MD;  [facility];  Service: Urology;  Laterality: [name];  . WRIST SURGERY Bilateral    plates and screws     Family History  Problem Relation Age of Onset  . Aneurysm Father     Social History   Socioeconomic History  . Marital status: Married    Spouse name: Not on file  . Number of children: 6  . Years of education: Not on file  . Highest education level: Not on file  Occupational History  . Not on file  Tobacco Use  . Smoking status: Never Smoker  . Smokeless tobacco: Never Used  Substance and Sexual Activity  . Alcohol use: Yes    Alcohol/week: 0.0 standard  drinks    Comment: seldom   . Drug use: No  . Sexual activity: Yes    Birth control/protection: None  Other Topics Concern  . Not on file  Social History Narrative   Pt lives in [place] with spouse.  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   Social Determinants of Health   Financial Resource Strain:   . Difficulty of Paying Living Expenses: Not on file  Food Insecurity:   . Worried About Charity fundraiser in the Last Year: Not on file  . Ran Out of Food in the Last Year: Not on file  Transportation Needs:   . Lack of Transportation (Medical): Not on file  . Lack of Transportation (Non-Medical): Not on file  Physical Activity:   . Days of Exercise per Week: Not on file  . Minutes of Exercise per Session: Not on file  Stress:   . Feeling of Stress : Not on file  Social Connections:   . Frequency of Communication with Friends and Family: Not on file  . Frequency of Social Gatherings with Friends and Family: Not on file  . Attends Religious Services: Not on file  . Active Member of Clubs or Organizations: Not on file  . Attends Archivist Meetings: Not on file  . Marital Status: Not on file  Intimate Partner Violence:   . Fear of Current or Ex-Partner: Not on file  . Emotionally Abused: Not on file  . Physically Abused: Not on file  . Sexually Abused: Not on file     Physical Exam   Vitals:   05/22/19 1218  BP: 106/72  Pulse: 100  Resp: 18  Temp: 98.3 F (36.8 C)  SpO2: 98%    CONSTITUTIONAL: Well-appearing, NAD NEURO:  Alert and oriented x 3, no focal deficits EYES:  eyes equal and reactive ENT/NECK:  no LAD, no JVD CARDIO: Regular rate, well-perfused, normal S1 and S2 PULM:  CTAB no wheezing or rhonchi GI/GU:  normal bowel sounds, non-distended, non-tender MSK/SPINE:  No gross deformities, no edema SKIN: Scattered abrasions to the face with laceration to the right forehead; mild tenderness to palpation to the ulnar aspect of the right hand  with preserved range of motion PSYCH:  Appropriate speech and behavior  *Additional and/or pertinent findings included in MDM below  Diagnostic and Interventional Summary    EKG Interpretation  Date/Time:    Ventricular Rate:    PR Interval:    QRS Duration:   QT Interval:    QTC Calculation:   R Axis:     Text Interpretation:        Cardiac Monitoring Interpretation:  Labs Reviewed - No data to display  CT Head Wo Contrast  Final Result    CT CERVICAL SPINE WO CONTRAST  Final Result      Medications  Tdap (BOOSTRIX) injection 0.5 mL (0.5 mLs Intramuscular Given 05/22/19 1236)  lidocaine-EPINEPHrine (XYLOCAINE W/EPI) 1 %-1:100000 (with  pres) injection 10 mL (10 mLs Other Given by Other 05/22/19 1238)     Procedures  /  Critical Care Procedures  ED Course and Medical Decision Making  I have reviewed the triage vital signs, the nursing notes, and pertinent available records from the EMR.  Pertinent labs & imaging results that were available during my care of the patient were reviewed by me and considered in my medical decision making (see below for details).     Patient denies syncope or dizziness or chest pain, purely mechanical fall, is anticoagulated, will need CT head to exclude intracranial bleeding.  Will need laceration repair.  Very mild soreness to the right hand with preserved function and range of motion and strength, doubt fracture, no need for imaging of the hand.  CT imaging is reassuring, lacerations repaired by PA Simonne Martinet, see separate note for details.  Appropriate for discharge.  Barth Kirks. Sedonia Small, [place] [email or web address]  Final Clinical Impressions(s) / ED Diagnoses     ICD-10-CM   1. Fall, initial encounter  W19.XXXA   2. Laceration of forehead, initial encounter  S01.81XA     ED Discharge Orders    None       Discharge Instructions Discussed with and Provided to Patient:      Discharge Instructions     You were evaluated in the Emergency Department and after careful evaluation, we did not find any emergent condition requiring admission or further testing in the hospital.  Your exam/testing today is overall reassuring.  Your CT scans did not show any emergencies or significant injuries.  Your sutures will need to be removed in 5 to 7 days by a healthcare professional.  Please return to the Emergency Department if you experience any worsening of your condition.  We encourage you to follow up with a primary care provider.  Thank you for allowing [place] to be a part of your care.       Maudie Flakes, MD 05/22/19 [phone]

## 2019-05-22 NOTE — ED Triage Notes (Signed)
Pt states he was carrying a heavy box today and fell forward hitting his head on concrete. Wife witnessed fall denies LOC, does take Coumadin. Laceration to upper right forehead, nose, and c/o right hand pain.

## 2019-06-17 NOTE — Progress Notes (Signed)
Cardiology Office Note    Date:  06/22/2019   ID:  Kevin Hart, Gilberts 01-16-1946, MRN [id number]  PCP:  Carlyle Basques, MD  Cardiologist: Dr. Martinique Sleep: Dr. Claiborne Billings Electrophysiologist: Dr. Rayann Heman  Chief Complaint  Patient presents with  . Coronary Artery Disease  . Congestive Heart Failure    History of Present Illness:  Kevin Hart is a 74 y.o. 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  He underwent DES to LAD for in-stent restenosis in April 2016, EF was 25 to 30% the time.  Repeat echocardiogram in May 2017 showed EF 30 to 35%.  He underwent ICD implantation by Dr. Rayann Heman in September 2016.  He was later diagnosed with obstructive sleep apnea at [facility].  Last Myoview obtained on 10/14/2016 showed EF 34%, no EKG changes, large defect of moderate severity present in the basal inferior, mid anterior, mid inferoseptal, mid inferior, apical anterior, apical septal and apical inferior location consistent with previous scar however very mild peri-infarct ischemia.  Medication titration after multiple PCI has been difficult to given orthostasis and low blood pressure.  He has been referred to to the lipid clinic for consideration of PCSK9 inhibitor and Entresto.  He was eventually started on Entresto in April 2019.  Repeat echocardiogram obtained on 08/18/2017 showed EF 35 to 40%, mild LVH, wall motion abnormality, mild AI, trivial MR.  He was diagnosed with right hemorrhagic renal cyst in August 2019. Plavix was discontinued at the time and Coumadin was restarted after 1 week. He was admitted in December 2019 was bladder outlet obstruction due to BPH. TURP procedure was performed.   He presented to the hospital on 07/27/2018 with chest pain. Cardiac catheterization February 2021 showed nonobstructive CAD.   He was seen  in the ED on March 1 after he tripped and fell. CT head was negative and he had lacerations sutured.   On follow up today his biggest complaint is of poor balance. He feels weak in his central muscles. Denies any chest pain, dyspnea or increased dyspnea. Does note occasional skipped beats.   Past Medical History:  Diagnosis Date  . Arthritis   . Bladder stone 10/26/2017  . BPH (benign prostatic hyperplasia)    takes Flomax daily  . CAD (coronary artery disease)    a. prior LAD stenting;  b. 06/2014 NSTEMI/PCI: LM nl, LAD 30p, 54m ISR (3.0x20 Promus DES), LCX nl, RCA nondom, nl, EF 25-30% ant, apical, dist inf AK.  . Cataracts, bilateral    immature  . Chronic systolic CHF (congestive heart failure) ([place]) 12/31/2014  . DVT (deep venous thrombosis) ([facility])    RECURRENT left leg  . Dyslipidemia   . GERD (gastroesophageal reflux disease)   . Gout   . History of kidney stones   . Hypertension   . Ischemic cardiomyopathy 07/06/2014  . Lupus anticoagulant positive   . Myocardial infarct ([place]) 2000  . Myocardial infarct ([place]) 2016  . S/P drug eluting coronary stent placement, 07/06/14, Promus 07/07/2014  . Sleep apnea    biPaP  . Subarachnoid bleed ([place]) 2015   after falling and hitting his head    Past Surgical History:  Procedure Laterality Date  . COLONOSCOPY    . EP IMPLANTABLE DEVICE [name] 11/27/2014   St. Jude Medical Fortify Assura VR  implanted by Dr Rayann Heman for primary prevention  .  LEFT HEART CATH AND CORONARY ANGIOGRAPHY [name] 07/29/2018   Procedure: LEFT HEART CATH AND CORONARY ANGIOGRAPHY;  Surgeon: Sherren Mocha, MD;  Location: [place] [facility];  Service: Cardiovascular;  Laterality: [name];  . LEFT HEART CATHETERIZATION WITH CORONARY ANGIOGRAM [name] 07/06/2014   Procedure: LEFT HEART CATHETERIZATION WITH CORONARY ANGIOGRAM;  Surgeon: Olivia Royse M Martinique, MD;  Location: [facility] CATH LAB;  Service: Cardiovascular;  Laterality: [name];  . PERCUTANEOUS CORONARY STENT INTERVENTION (PCI-S) Right  07/06/2014   Procedure: PERCUTANEOUS CORONARY STENT INTERVENTION (PCI-S);  Surgeon: Rease Wence M Martinique, MD;  Location: [facility] CATH LAB;  Service: Cardiovascular;  Laterality: Right;  . SHOULDER ARTHROSCOPY WITH SUBACROMIAL DECOMPRESSION Left 03/05/2016   Procedure: SHOULDER ARTHROSCOPY ROTATOR CUFF REPAIR WITH SUBACROMIAL DECOMPRESSION;  Surgeon: Tania Ade, MD;  Location: [place];  Service: Orthopedics;  Laterality: Left;  SHOULDER ARTHROSCOPY ROTATOR CUFF REPAIR WITH SUBACROMIAL DECOMPRESSION  . SHOULDER CLOSED REDUCTION Left 11/29/2015   Procedure: CLOSED REDUCTION SHOULDER;  Surgeon: Tania Ade, MD;  Location: [place];  Service: Orthopedics;  Laterality: Left;  . TRANSURETHRAL RESECTION OF PROSTATE [name] 02/24/2018   Procedure: TRANSURETHRAL RESECTION OF THE PROSTATE (TURP);  Surgeon: Irine Seal, MD;  Location: [facility];  Service: Urology;  Laterality: [name];  . WRIST SURGERY Bilateral    plates and screws     Current Medications: Outpatient Medications Prior to Visit  Medication Sig Dispense Refill  . allopurinol (ZYLOPRIM) 300 MG tablet Take 300 mg by mouth daily.     . furosemide (LASIX) 40 MG tablet Take 40 mg by mouth daily.     . Multiple Vitamin (MULTI-VITAMINS) TABS Take 1 tablet by mouth daily.     . nitroGLYCERIN (NITROSTAT) 0.4 MG SL tablet Place 1 tablet (0.4 mg total) under the tongue every 5 (five) minutes x 3 doses as needed for chest pain. 25 tablet 11  . pantoprazole (PROTONIX) 40 MG tablet Take 1 tablet (40 mg total) by mouth daily. 30 tablet 2  . propranolol (INDERAL) 20 MG tablet Take 2 tablets by mouth daily.    . rosuvastatin (CRESTOR) 20 MG tablet Take 1 tablet (20 mg total) by mouth daily. 90 tablet 3  . sacubitril-valsartan (ENTRESTO) 24-26 MG TAKE ONE TABLET BY MOUTH TWICE DAILY 180 tablet 3  . warfarin (COUMADIN) 5 MG tablet Take 0.5-1 tablets (2.5-5 mg total) by mouth See admin instructions. Take 5 mg by mouth on Tuesday, Wednesday, Thursday, Saturday and Sunday then take  2.5 mg by mouth on Monday and Friday     No facility-administered medications prior to visit.     Allergies:   Patient has no known allergies.   Social History   Socioeconomic History  . Marital status: Married    Spouse name: Not on file  . Number of children: 6  . Years of education: Not on file  . Highest education level: Not on file  Occupational History  . Not on file  Tobacco Use  . Smoking status: Never Smoker  . Smokeless tobacco: Never Used  Substance and Sexual Activity  . Alcohol use: Yes    Alcohol/week: 0.0 standard drinks    Comment: seldom   . Drug use: No  . Sexual activity: Yes    Birth control/protection: None  Other Topics Concern  . Not on file  Social History Narrative   Pt lives in [place] with spouse.  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   Social Determinants of Health   Financial Resource Strain:   . Difficulty of Paying Living Expenses:  Food Insecurity:   . Worried About Charity fundraiser in the Last Year:   . Arboriculturist in the Last Year:   Transportation Needs:   . Film/video editor (Medical):   Marland Kitchen Lack of Transportation (Non-Medical):   Physical Activity:   . Days of Exercise per Week:   . Minutes of Exercise per Session:   Stress:   . Feeling of Stress :   Social Connections:   . Frequency of Communication with Friends and Family:   . Frequency of Social Gatherings with Friends and Family:   . Attends Religious Services:   . Active Member of Clubs or Organizations:   . Attends Archivist Meetings:   Marland Kitchen Marital Status:      Family History:  The patient's family history includes Aneurysm in his father.   ROS:   Please see the history of present illness.    ROS All other systems reviewed and are negative.   PHYSICAL EXAM:   VS:  BP (!) 102/54   Pulse 78   Ht 5\' 10"  (1.778 m)   Wt 224 lb (101.6 kg)   BMI 32.14 kg/m    GEN: Well nourished, well developed, in no acute distress  HEENT:  normal  Neck: no JVD, carotid bruits, or masses Cardiac: RRR; no murmurs, rubs, or gallops,no edema  Respiratory:  clear to auscultation bilaterally, normal work of breathing GI: soft, nontender, nondistended, + BS MS: no deformity or atrophy  Skin: warm and dry, no rash Neuro:  Alert and Oriented x 3, Strength and sensation are intact Psych: euthymic mood, full affect  Wt Readings from Last 3 Encounters:  06/22/19 224 lb (101.6 kg)  05/22/19 215 lb (97.5 kg)  05/09/19 215 lb (97.5 kg)      Studies/Labs Reviewed:   EKG:  EKG is  ordered today.  NSR with occ PVC. Low voltage. Old anterior and inferior infarct. I have personally reviewed and interpreted this study.   Recent Labs: 07/27/2018: Magnesium 2.0 07/29/2018: BUN 13; Creatinine, Ser 1.14; Hemoglobin 13.5; Platelets 157; Potassium 3.3; Sodium 142 09/14/2018: ALT 21   Lipid Panel    Component Value Date/Time   CHOL 133 09/14/2018 0837   TRIG 123 09/14/2018 0837   HDL 49 09/14/2018 0837   CHOLHDL 2.7 09/14/2018 0837   CHOLHDL 4 07/25/2014 1029   VLDL 24.4 07/25/2014 1029   LDLCALC 59 09/14/2018 0837    Additional studies/ records that were reviewed today include:   Echo 08/18/2017 LV EF: 35% -   40% Study Conclusions  - Left ventricle: The cavity size was normal. Wall thickness was   increased in a pattern of mild LVH. Systolic function was   moderately reduced. The estimated ejection fraction was in the   range of 35% to 40%. Anterior, anteroseptal, apical and   inferoapical hypokinesis. Doppler parameters are consistent with   abnormal left ventricular relaxation (grade 1 diastolic   dysfunction). The E/e&' ratio is <8, suggesting normal LV filling   pressure. - Aortic valve: Sclerosis without stenosis. There was mild   regurgitation. - Mitral valve: Mildly thickened leaflets . There was trivial   regurgitation. - Left atrium: The atrium was normal in size. - Right ventricle: The cavity size was mildly  dilated. Pacer or   AICD wire in right ventricle. Systolic function was normal. - Right atrium: The atrium was mildly dilated. Pacer or AICD wire   noted in right atrium. - Tricuspid valve: There was trivial  regurgitation. - Pulmonary arteries: PA peak pressure: 25 mm Hg (S). - Inferior vena cava: The vessel was normal in size. The   respirophasic diameter changes were in the normal range (= 50%),   consistent with normal central venous pressure.  Impressions:  - Compared to a prior study in 07/2015, the LVEF has slightly   improved to 35-40% with persistent LAD territory wall motion   abnormalities.   Cath5/10/2018  Prox LAD to Mid LAD lesion is 30% stenosed.  LV end diastolic pressure is normal.  1. Mild in-stent restenosis in the LAD 2. Widely patent dominant LCx 3. Patent, nondominant RCA 4. Normal LVEDP  Suspect noncardiac chest pain. Resume warfarin today. Continue medical therapy for secondary risk reduction. OK for discharge home today from cardiac perspective.  ASSESSMENT:    1. Chronic systolic CHF (congestive heart failure) ([place])   2. Coronary artery disease involving native coronary artery of native heart without angina pectoris   3. Ischemic cardiomyopathy   4. AICD (automatic cardioverter/defibrillator) present   5. Hyperlipidemia, unspecified hyperlipidemia type      PLAN:  In order of problems listed above:  1. Chronic systolic heart failure: EF 35-40%.  Due to history of ischemic cardiomyopathy.  Euvolemic based on physical exam.  Continue Inderal and Entresto.  Due low BP and fatigue eplerenone was previously discontinued.   2. Ischemic cardiomyopathy s/p ICD: EF improved to 35 to 40% on last echocardiogram.  3. CAD: Denies any anginal symptoms.  Not on aspirin given the need for Coumadin. Cardiac cath in May 2020 showed nonobstructive CAD.     4.   Hyperlipidemia: on prior visit we did reduce Crestor dose due to muscle weakness and fatigue.  Requested a copy of last lab work from Dr Holley Raring  5.   Renal cyst hemorrhage.   6.   History of recurrent DVT with lupus anticoagulant positive: Continue on Coumadin.    Medication Adjustments/Labs and Tests Ordered: Current medicines are reviewed at length with the patient today.  Concerns regarding medicines are outlined above.  Medication changes, Labs and Tests ordered today are listed in the Patient Instructions below. There are no Patient Instructions on file for this visit.   Signed, Maybell Misenheimer Martinique, MD  06/22/2019 1:33 PM    [facility] [address], [place], [place]  [address] Phone: [phone]; Fax: [phone]

## 2019-06-21 DIAGNOSIS — R269 Unspecified abnormalities of gait and mobility: Secondary | ICD-10-CM | POA: Diagnosis not present

## 2019-06-21 DIAGNOSIS — R200 Anesthesia of skin: Secondary | ICD-10-CM | POA: Diagnosis not present

## 2019-06-21 DIAGNOSIS — M545 Low back pain: Secondary | ICD-10-CM | POA: Diagnosis not present

## 2019-06-22 ENCOUNTER — Ambulatory Visit (INDEPENDENT_AMBULATORY_CARE_PROVIDER_SITE_OTHER): Payer: PPO | Admitting: Cardiology

## 2019-06-22 ENCOUNTER — Other Ambulatory Visit: Payer: Self-pay

## 2019-06-22 ENCOUNTER — Encounter: Payer: Self-pay | Admitting: Cardiology

## 2019-06-22 VITALS — BP 102/54 | HR 78 | Ht 70.0 in | Wt 224.0 lb

## 2019-06-22 DIAGNOSIS — I255 Ischemic cardiomyopathy: Secondary | ICD-10-CM

## 2019-06-22 DIAGNOSIS — Z95810 Presence of automatic (implantable) cardiac defibrillator: Secondary | ICD-10-CM

## 2019-06-22 DIAGNOSIS — E785 Hyperlipidemia, unspecified: Secondary | ICD-10-CM | POA: Diagnosis not present

## 2019-06-22 DIAGNOSIS — I5022 Chronic systolic (congestive) heart failure: Secondary | ICD-10-CM

## 2019-06-22 DIAGNOSIS — I2510 Atherosclerotic heart disease of native coronary artery without angina pectoris: Secondary | ICD-10-CM | POA: Diagnosis not present

## 2019-06-26 ENCOUNTER — Ambulatory Visit (INDEPENDENT_AMBULATORY_CARE_PROVIDER_SITE_OTHER): Payer: PPO | Admitting: *Deleted

## 2019-06-26 DIAGNOSIS — I5022 Chronic systolic (congestive) heart failure: Secondary | ICD-10-CM

## 2019-06-26 LAB — CUP PACEART REMOTE DEVICE CHECK
Battery Remaining Longevity: 62 mo
Battery Remaining Percentage: 59 %
Battery Voltage: 2.99 V
Brady Statistic RV Percent Paced: 1 %
Date Time Interrogation Session: 20210405020015
HighPow Impedance: 70 Ohm
HighPow Impedance: 70 Ohm
Implantable Lead Implant Date: 20160906
Implantable Lead Location: 753860
Implantable Pulse Generator Implant Date: 20160906
Lead Channel Impedance Value: 450 Ohm
Lead Channel Pacing Threshold Amplitude: 0.75 V
Lead Channel Pacing Threshold Pulse Width: 0.5 ms
Lead Channel Sensing Intrinsic Amplitude: 11.8 mV
Lead Channel Setting Pacing Amplitude: 2.5 V
Lead Channel Setting Pacing Pulse Width: 0.5 ms
Lead Channel Setting Sensing Sensitivity: 0.5 mV
Pulse Gen Serial Number: 7257239

## 2019-06-27 ENCOUNTER — Ambulatory Visit (INDEPENDENT_AMBULATORY_CARE_PROVIDER_SITE_OTHER): Payer: PPO

## 2019-06-27 DIAGNOSIS — I5022 Chronic systolic (congestive) heart failure: Secondary | ICD-10-CM

## 2019-06-27 DIAGNOSIS — Z95810 Presence of automatic (implantable) cardiac defibrillator: Secondary | ICD-10-CM | POA: Diagnosis not present

## 2019-06-27 NOTE — Progress Notes (Signed)
ICD Remote  

## 2019-06-27 NOTE — Progress Notes (Signed)
EPIC Encounter for ICM Monitoring  Patient Name: Kevin Hart is a 74 y.o. male Date: 06/27/2019 Primary Care Physican: Carlyle Basques, MD Primary [place] Electrophysiologist: Allred Last Weight:220 lbs   Spoke with patient and he is feeling well.[place] has been at the beach for the last month and eating differently.  He ran of Furosemide and has not taken it for past 2 days.    CorvueThoracic impedance suggesting possible fluid accumulation since 06/25/2019.  Prescribed:Furosemide 40 mg 1 tablet daily.  Labs: 07/29/2018 Creatinine1.14, BUN13, Potassium3.3, [id number], GFR>60 07/27/2018 Creatinine1.09, BUN16, Potassium3.5, Sodium142, GFR>60  07/26/2018 Creatinine1.21, BUN18, Potassium3.4, Sodium140, GFR59->60 A complete set of results can be found in Results Review.  Recommendations:Furosemide has been refilled and he will resume his prescribed daily dosage.  Advised to limit salt intake.   Follow-up plan: ICM clinic phone appointment on4/14/2021 to recheck fluid levels. 91 day device clinic remote transmission 09/26/2019.    Copy of ICM check sent to Dr. Rayann Heman.   3 month ICM trend: 06/27/2019    1 Year ICM trend:       Rosalene Billings, RN 06/27/2019 4:19 PM

## 2019-06-28 DIAGNOSIS — C44319 Basal cell carcinoma of skin of other parts of face: Secondary | ICD-10-CM | POA: Diagnosis not present

## 2019-06-30 DIAGNOSIS — I509 Heart failure, unspecified: Secondary | ICD-10-CM | POA: Diagnosis not present

## 2019-06-30 DIAGNOSIS — M5136 Other intervertebral disc degeneration, lumbar region: Secondary | ICD-10-CM | POA: Diagnosis not present

## 2019-06-30 DIAGNOSIS — I2510 Atherosclerotic heart disease of native coronary artery without angina pectoris: Secondary | ICD-10-CM | POA: Diagnosis not present

## 2019-06-30 DIAGNOSIS — G4733 Obstructive sleep apnea (adult) (pediatric): Secondary | ICD-10-CM | POA: Diagnosis not present

## 2019-07-05 ENCOUNTER — Ambulatory Visit (INDEPENDENT_AMBULATORY_CARE_PROVIDER_SITE_OTHER): Payer: PPO

## 2019-07-05 DIAGNOSIS — I5022 Chronic systolic (congestive) heart failure: Secondary | ICD-10-CM

## 2019-07-05 DIAGNOSIS — Z95810 Presence of automatic (implantable) cardiac defibrillator: Secondary | ICD-10-CM

## 2019-07-06 DIAGNOSIS — M545 Low back pain: Secondary | ICD-10-CM | POA: Diagnosis not present

## 2019-07-06 DIAGNOSIS — M4180 Other forms of scoliosis, site unspecified: Secondary | ICD-10-CM | POA: Diagnosis not present

## 2019-07-06 DIAGNOSIS — M5416 Radiculopathy, lumbar region: Secondary | ICD-10-CM | POA: Diagnosis not present

## 2019-07-07 ENCOUNTER — Telehealth: Payer: Self-pay

## 2019-07-07 NOTE — Progress Notes (Signed)
EPIC Encounter for ICM Monitoring  Patient Name: Kevin Hart is a 74 y.o. male Date: 07/07/2019 Primary Care Physican: Carlyle Basques, MD Primary [place] Electrophysiologist: Allred Last Weight:220 lbs   Attempted call to patient and unable to reach.  Left detailed message per DPR regarding transmission. Transmission reviewed.   CorvueThoracic impedance returned to normal since 06/27/19 remote transmission and having Furosemide prescription refilled.  Prescribed:Furosemide 40 mg 1 tablet daily.  Labs: 07/29/2018 Creatinine1.14, BUN13, Potassium3.3, [id number], GFR>60 07/27/2018 Creatinine1.09, BUN16, Potassium3.5, Sodium142, GFR>60  07/26/2018 Creatinine1.21, BUN18, Potassium3.4, Sodium140, GFR59->60 A complete set of results can be found in Results Review.  Recommendations:Left voice mail with ICM number and encouraged to call if experiencing any fluid symptoms.  Follow-up plan: ICM clinic phone appointment on5/12/2019. 91 day device clinic remote transmission7/08/2019.  Copy of ICM check sent to Dr. Rayann Heman.   3 month ICM trend: 07/05/2019    1 Year ICM trend:       Rosalene Billings, RN 07/07/2019 8:39 AM

## 2019-07-07 NOTE — Telephone Encounter (Signed)
Remote ICM transmission received.  Attempted call to patient regarding ICM remote transmission and left detailed message per DPR.  Advised to return call for any fluid symptoms or questions. Next ICM remote transmission scheduled 07/31/2019.     

## 2019-07-11 DIAGNOSIS — H019 Unspecified inflammation of eyelid: Secondary | ICD-10-CM | POA: Diagnosis not present

## 2019-07-11 DIAGNOSIS — H57813 Brow ptosis, bilateral: Secondary | ICD-10-CM | POA: Diagnosis not present

## 2019-07-11 DIAGNOSIS — H02831 Dermatochalasis of right upper eyelid: Secondary | ICD-10-CM | POA: Diagnosis not present

## 2019-07-11 DIAGNOSIS — H0279 Other degenerative disorders of eyelid and periocular area: Secondary | ICD-10-CM | POA: Diagnosis not present

## 2019-07-11 DIAGNOSIS — H53483 Generalized contraction of visual field, bilateral: Secondary | ICD-10-CM | POA: Diagnosis not present

## 2019-07-11 DIAGNOSIS — H02423 Myogenic ptosis of bilateral eyelids: Secondary | ICD-10-CM | POA: Diagnosis not present

## 2019-07-11 DIAGNOSIS — H02413 Mechanical ptosis of bilateral eyelids: Secondary | ICD-10-CM | POA: Diagnosis not present

## 2019-07-11 DIAGNOSIS — H02834 Dermatochalasis of left upper eyelid: Secondary | ICD-10-CM | POA: Diagnosis not present

## 2019-07-14 DIAGNOSIS — I714 Abdominal aortic aneurysm, without rupture: Secondary | ICD-10-CM | POA: Diagnosis not present

## 2019-07-14 DIAGNOSIS — Z7901 Long term (current) use of anticoagulants: Secondary | ICD-10-CM | POA: Diagnosis not present

## 2019-07-14 DIAGNOSIS — I2510 Atherosclerotic heart disease of native coronary artery without angina pectoris: Secondary | ICD-10-CM | POA: Diagnosis not present

## 2019-07-14 DIAGNOSIS — I509 Heart failure, unspecified: Secondary | ICD-10-CM | POA: Diagnosis not present

## 2019-07-20 DIAGNOSIS — H53482 Generalized contraction of visual field, left eye: Secondary | ICD-10-CM | POA: Diagnosis not present

## 2019-07-20 DIAGNOSIS — H53481 Generalized contraction of visual field, right eye: Secondary | ICD-10-CM | POA: Diagnosis not present

## 2019-07-20 DIAGNOSIS — H53483 Generalized contraction of visual field, bilateral: Secondary | ICD-10-CM | POA: Diagnosis not present

## 2019-07-31 ENCOUNTER — Telehealth: Payer: Self-pay | Admitting: Internal Medicine

## 2019-07-31 ENCOUNTER — Ambulatory Visit (INDEPENDENT_AMBULATORY_CARE_PROVIDER_SITE_OTHER): Payer: PPO

## 2019-07-31 DIAGNOSIS — I5022 Chronic systolic (congestive) heart failure: Secondary | ICD-10-CM

## 2019-07-31 DIAGNOSIS — Z95810 Presence of automatic (implantable) cardiac defibrillator: Secondary | ICD-10-CM

## 2019-07-31 NOTE — Telephone Encounter (Signed)
Dawn, Radiology Scheduler from  [facility] center will be faxing a form to the [facility] to get specific information about his pacemaker. The patient will need to have an MRI and the procedure can not be scheduled until [facility] has this information.

## 2019-07-31 NOTE — Telephone Encounter (Signed)
We will send it when it is received.

## 2019-08-02 NOTE — Progress Notes (Signed)
EPIC Encounter for ICM Monitoring  Patient Name: RIAAN SKOKAN is a 74 y.o. male Date: 08/02/2019 Primary Care Physican: Carlyle Basques, MD Primary [place] Electrophysiologist: Allred 08/02/2019 Weight:217 lbs   Spoke with patient and reports feeling well at this time.  Denies fluid symptoms.    CorvueThoracic impedance normal but was suggesting possible fluid accumulation from 4/22 - 4/27 & 5/5 - 5/7.  Prescribed:Furosemide 40 mg 1 tablet daily.  Labs: 07/29/2018 Creatinine1.14, BUN13, Potassium3.3, [id number], GFR>60 07/27/2018 Creatinine1.09, BUN16, Potassium3.5, Sodium142, GFR>60  07/26/2018 Creatinine1.21, BUN18, Potassium3.4, Sodium140, GFR59->60 A complete set of results can be found in Results Review.  Recommendations: No changes and encouraged to call if experiencing any fluid symptoms.  Follow-up plan: ICM clinic phone appointment on6/14/2021. 91 day device clinic remote transmission7/08/2019.  Copy of ICM check sent to Dr.Allred.    3 month ICM trend: 07/31/2019    1 Year ICM trend:       Rosalene Billings, RN 08/02/2019 8:45 AM

## 2019-08-02 NOTE — Telephone Encounter (Signed)
Form completed and faxed back. Confirmation received. Spoke with Dawn in scheduling. Provided DC phone number if MRI department has any questions.

## 2019-08-09 DIAGNOSIS — M545 Low back pain: Secondary | ICD-10-CM | POA: Diagnosis not present

## 2019-08-11 ENCOUNTER — Emergency Department (HOSPITAL_BASED_OUTPATIENT_CLINIC_OR_DEPARTMENT_OTHER): Payer: PPO

## 2019-08-11 ENCOUNTER — Encounter (HOSPITAL_BASED_OUTPATIENT_CLINIC_OR_DEPARTMENT_OTHER): Payer: Self-pay | Admitting: Emergency Medicine

## 2019-08-11 ENCOUNTER — Other Ambulatory Visit: Payer: Self-pay

## 2019-08-11 ENCOUNTER — Inpatient Hospital Stay (HOSPITAL_BASED_OUTPATIENT_CLINIC_OR_DEPARTMENT_OTHER)
Admission: EM | Admit: 2019-08-11 | Discharge: 2019-08-15 | DRG: 287 | Disposition: A | Discharge status: Home / Self Care | Payer: PPO | Attending: Cardiology | Admitting: Cardiology

## 2019-08-11 DIAGNOSIS — Z955 Presence of coronary angioplasty implant and graft: Secondary | ICD-10-CM

## 2019-08-11 DIAGNOSIS — M109 Gout, unspecified: Secondary | ICD-10-CM | POA: Diagnosis present

## 2019-08-11 DIAGNOSIS — G4733 Obstructive sleep apnea (adult) (pediatric): Secondary | ICD-10-CM | POA: Diagnosis not present

## 2019-08-11 DIAGNOSIS — Z9989 Dependence on other enabling machines and devices: Secondary | ICD-10-CM | POA: Diagnosis not present

## 2019-08-11 DIAGNOSIS — I200 Unstable angina: Secondary | ICD-10-CM | POA: Diagnosis not present

## 2019-08-11 DIAGNOSIS — I25110 Atherosclerotic heart disease of native coronary artery with unstable angina pectoris: Secondary | ICD-10-CM | POA: Diagnosis not present

## 2019-08-11 DIAGNOSIS — Y831 Surgical operation with implant of artificial internal device as the cause of abnormal reaction of the patient, or of later complication, without mention of misadventure at the time of the procedure: Secondary | ICD-10-CM | POA: Diagnosis present

## 2019-08-11 DIAGNOSIS — D6862 Lupus anticoagulant syndrome: Secondary | ICD-10-CM | POA: Diagnosis not present

## 2019-08-11 DIAGNOSIS — I5022 Chronic systolic (congestive) heart failure: Secondary | ICD-10-CM

## 2019-08-11 DIAGNOSIS — I252 Old myocardial infarction: Secondary | ICD-10-CM | POA: Diagnosis not present

## 2019-08-11 DIAGNOSIS — Z20822 Contact with and (suspected) exposure to covid-19: Secondary | ICD-10-CM | POA: Diagnosis present

## 2019-08-11 DIAGNOSIS — E785 Hyperlipidemia, unspecified: Secondary | ICD-10-CM | POA: Diagnosis not present

## 2019-08-11 DIAGNOSIS — Z7901 Long term (current) use of anticoagulants: Secondary | ICD-10-CM

## 2019-08-11 DIAGNOSIS — I25119 Atherosclerotic heart disease of native coronary artery with unspecified angina pectoris: Secondary | ICD-10-CM | POA: Diagnosis not present

## 2019-08-11 DIAGNOSIS — I341 Nonrheumatic mitral (valve) prolapse: Secondary | ICD-10-CM | POA: Diagnosis not present

## 2019-08-11 DIAGNOSIS — I110 Hypertensive heart disease with heart failure: Secondary | ICD-10-CM | POA: Diagnosis not present

## 2019-08-11 DIAGNOSIS — K219 Gastro-esophageal reflux disease without esophagitis: Secondary | ICD-10-CM | POA: Diagnosis present

## 2019-08-11 DIAGNOSIS — E782 Mixed hyperlipidemia: Secondary | ICD-10-CM

## 2019-08-11 DIAGNOSIS — Z95810 Presence of automatic (implantable) cardiac defibrillator: Secondary | ICD-10-CM

## 2019-08-11 DIAGNOSIS — E876 Hypokalemia: Secondary | ICD-10-CM

## 2019-08-11 DIAGNOSIS — I2510 Atherosclerotic heart disease of native coronary artery without angina pectoris: Secondary | ICD-10-CM

## 2019-08-11 DIAGNOSIS — T82855A Stenosis of coronary artery stent, initial encounter: Secondary | ICD-10-CM | POA: Diagnosis not present

## 2019-08-11 DIAGNOSIS — Z86718 Personal history of other venous thrombosis and embolism: Secondary | ICD-10-CM

## 2019-08-11 DIAGNOSIS — R079 Chest pain, unspecified: Secondary | ICD-10-CM | POA: Diagnosis not present

## 2019-08-11 DIAGNOSIS — I82409 Acute embolism and thrombosis of unspecified deep veins of unspecified lower extremity: Secondary | ICD-10-CM

## 2019-08-11 DIAGNOSIS — I25118 Atherosclerotic heart disease of native coronary artery with other forms of angina pectoris: Secondary | ICD-10-CM | POA: Diagnosis not present

## 2019-08-11 DIAGNOSIS — I340 Nonrheumatic mitral (valve) insufficiency: Secondary | ICD-10-CM | POA: Diagnosis not present

## 2019-08-11 DIAGNOSIS — I82402 Acute embolism and thrombosis of unspecified deep veins of left lower extremity: Secondary | ICD-10-CM | POA: Diagnosis not present

## 2019-08-11 DIAGNOSIS — N400 Enlarged prostate without lower urinary tract symptoms: Secondary | ICD-10-CM | POA: Diagnosis not present

## 2019-08-11 DIAGNOSIS — I255 Ischemic cardiomyopathy: Secondary | ICD-10-CM | POA: Diagnosis not present

## 2019-08-11 DIAGNOSIS — I361 Nonrheumatic tricuspid (valve) insufficiency: Secondary | ICD-10-CM | POA: Diagnosis not present

## 2019-08-11 DIAGNOSIS — R072 Precordial pain: Secondary | ICD-10-CM | POA: Diagnosis not present

## 2019-08-11 DIAGNOSIS — Z79899 Other long term (current) drug therapy: Secondary | ICD-10-CM

## 2019-08-11 LAB — PROTIME-INR
INR: 2.4 — ABNORMAL HIGH (ref 0.8–1.2)
Prothrombin Time: 25.4 seconds — ABNORMAL HIGH (ref 11.4–15.2)

## 2019-08-11 LAB — CBC WITH DIFFERENTIAL/PLATELET
Abs Immature Granulocytes: 0.03 10*3/uL (ref 0.00–0.07)
Basophils Absolute: 0.1 10*3/uL (ref 0.0–0.1)
Basophils Relative: 1 %
Eosinophils Absolute: 0.2 10*3/uL (ref 0.0–0.5)
Eosinophils Relative: 3 %
HCT: 41.9 % (ref 39.0–52.0)
Hemoglobin: 14.3 g/dL (ref 13.0–17.0)
Immature Granulocytes: 1 %
Lymphocytes Relative: 31 %
Lymphs Abs: 2.1 10*3/uL (ref 0.7–4.0)
MCH: 30.8 pg (ref 26.0–34.0)
MCHC: 34.1 g/dL (ref 30.0–36.0)
MCV: 90.3 fL (ref 80.0–100.0)
Monocytes Absolute: 0.8 10*3/uL (ref 0.1–1.0)
Monocytes Relative: 12 %
Neutro Abs: 3.4 10*3/uL (ref 1.7–7.7)
Neutrophils Relative %: 52 %
Platelets: 180 10*3/uL (ref 150–400)
RBC: 4.64 MIL/uL (ref 4.22–5.81)
RDW: 15.3 % (ref 11.5–15.5)
WBC: 6.7 10*3/uL (ref 4.0–10.5)
nRBC: 0 % (ref 0.0–0.2)

## 2019-08-11 LAB — BASIC METABOLIC PANEL
Anion gap: 10 (ref 5–15)
Anion gap: 10 (ref 5–15)
BUN: 19 mg/dL (ref 8–23)
BUN: 13 mg/dL (ref 8–23)
CO2: 28 mmol/L (ref 22–32)
CO2: 25 mmol/L (ref 22–32)
Calcium: 9.2 mg/dL (ref 8.9–10.3)
Calcium: 9.2 mg/dL (ref 8.9–10.3)
Chloride: 105 mmol/L (ref 98–111)
Chloride: 104 mmol/L (ref 98–111)
Creatinine, Ser: 1.32 mg/dL — ABNORMAL HIGH (ref 0.61–1.24)
Creatinine, Ser: 1.08 mg/dL (ref 0.61–1.24)
GFR calc Af Amer: >60 mL/min (ref >60)
GFR calc Af Amer: >60 mL/min (ref >60)
GFR calc non Af Amer: 52 mL/min — ABNORMAL LOW (ref >60)
GFR calc non Af Amer: >60 mL/min (ref >60)
Glucose, Bld: 115 mg/dL — ABNORMAL HIGH (ref 70–99)
Glucose, Bld: 151 mg/dL — ABNORMAL HIGH (ref 70–99)
Potassium: 3.1 mmol/L — ABNORMAL LOW (ref 3.5–5.1)
Potassium: 3.8 mmol/L (ref 3.5–5.1)
Sodium: 143 mmol/L (ref 135–145)
Sodium: 139 mmol/L (ref 135–145)

## 2019-08-11 LAB — CREATININE, SERUM
Creatinine, Ser: 1.06 mg/dL (ref 0.61–1.24)
GFR calc Af Amer: >60 mL/min (ref >60)
GFR calc non Af Amer: >60 mL/min (ref >60)

## 2019-08-11 LAB — CBC
HCT: 41.1 % (ref 39.0–52.0)
Hemoglobin: 13.9 g/dL (ref 13.0–17.0)
MCH: 30.8 pg (ref 26.0–34.0)
MCHC: 33.8 g/dL (ref 30.0–36.0)
MCV: 91.1 fL (ref 80.0–100.0)
Platelets: 174 10*3/uL (ref 150–400)
RBC: 4.51 MIL/uL (ref 4.22–5.81)
RDW: 15 % (ref 11.5–15.5)
WBC: 5.8 10*3/uL (ref 4.0–10.5)
nRBC: 0 % (ref 0.0–0.2)

## 2019-08-11 LAB — CBG MONITORING, ED: Glucose-Capillary: 96 mg/dL (ref 70–99)

## 2019-08-11 LAB — TROPONIN I (HIGH SENSITIVITY)
Troponin I (High Sensitivity): 23 ng/L — ABNORMAL HIGH (ref <18)
Troponin I (High Sensitivity): 19 ng/L — ABNORMAL HIGH (ref <18)

## 2019-08-11 LAB — SARS CORONAVIRUS 2 BY RT PCR (HOSPITAL ORDER, PERFORMED IN ~~LOC~~ HOSPITAL LAB): SARS Coronavirus 2: NEGATIVE

## 2019-08-11 LAB — MAGNESIUM: Magnesium: 2 mg/dL (ref 1.7–2.4)

## 2019-08-11 LAB — T4, FREE: Free T4: 1.14 ng/dL — ABNORMAL HIGH (ref 0.61–1.12)

## 2019-08-11 LAB — TSH: TSH: 1.081 u[IU]/mL (ref 0.350–4.500)

## 2019-08-11 MED ORDER — NITROGLYCERIN 0.4 MG SL SUBL: 0.4000 mg | SUBLINGUAL_TABLET | SUBLINGUAL | Status: DC | PRN

## 2019-08-11 MED ORDER — WARFARIN SODIUM 2.5 MG PO TABS: 2.5000 mg | ORAL_TABLET | ORAL | Status: DC

## 2019-08-11 MED ORDER — PANTOPRAZOLE SODIUM 40 MG PO TBEC
40.0000 mg | DELAYED_RELEASE_TABLET | Freq: Every day | ORAL | Status: DC
  Administered 2019-08-11 – 2019-08-15 (×5): 40 mg via ORAL
  Filled 2019-08-11 (×5): qty 1

## 2019-08-11 MED ORDER — SACUBITRIL-VALSARTAN 24-26 MG PO TABS
1.0000 | ORAL_TABLET | Freq: Two times a day (BID) | ORAL | Status: DC
  Administered 2019-08-12 – 2019-08-15 (×8): 1 via ORAL
  Filled 2019-08-11 (×9): qty 1

## 2019-08-11 MED ORDER — ENOXAPARIN SODIUM 100 MG/ML ~~LOC~~ SOLN: 1.0000 mg/kg | Freq: Two times a day (BID) | SUBCUTANEOUS | Status: DC

## 2019-08-11 MED ORDER — PROPRANOLOL HCL 20 MG PO TABS
20.0000 mg | ORAL_TABLET | Freq: Two times a day (BID) | ORAL | Status: DC
  Administered 2019-08-12 – 2019-08-15 (×8): 20 mg via ORAL
  Filled 2019-08-11 (×9): qty 1

## 2019-08-11 MED ORDER — ROSUVASTATIN CALCIUM 20 MG PO TABS
20.0000 mg | ORAL_TABLET | Freq: Every day | ORAL | Status: DC
  Administered 2019-08-12 – 2019-08-15 (×4): 20 mg via ORAL
  Filled 2019-08-11 (×5): qty 1

## 2019-08-11 MED ORDER — NITROGLYCERIN IN D5W 200-5 MCG/ML-% IV SOLN
0.0000 ug/min | INTRAVENOUS | Status: DC
  Administered 2019-08-11: 5 ug/min via INTRAVENOUS
  Filled 2019-08-11: qty 250

## 2019-08-11 MED ORDER — FENTANYL CITRATE (PF) 100 MCG/2ML IJ SOLN
25.0000 ug | INTRAMUSCULAR | Status: DC | PRN
  Administered 2019-08-11: 25 ug via INTRAVENOUS
  Filled 2019-08-11: qty 2

## 2019-08-11 MED ORDER — WARFARIN SODIUM 2.5 MG PO TABS
2.5000 mg | ORAL_TABLET | ORAL | Status: DC
  Filled 2019-08-11: qty 2

## 2019-08-11 MED ORDER — ONDANSETRON HCL 4 MG/2ML IJ SOLN: 4.0000 mg | Freq: Four times a day (QID) | INTRAMUSCULAR | Status: DC | PRN

## 2019-08-11 MED ORDER — ALLOPURINOL 300 MG PO TABS
300.0000 mg | ORAL_TABLET | Freq: Every day | ORAL | Status: DC
  Administered 2019-08-12 – 2019-08-15 (×4): 300 mg via ORAL
  Filled 2019-08-11 (×5): qty 1

## 2019-08-11 MED ORDER — WARFARIN - PHYSICIAN DOSING INPATIENT: Freq: Every day | Status: DC

## 2019-08-11 MED ORDER — ACETAMINOPHEN 325 MG PO TABS: 650.0000 mg | ORAL_TABLET | ORAL | Status: DC | PRN

## 2019-08-11 MED ORDER — WARFARIN SODIUM 5 MG PO TABS: 5.0000 mg | ORAL_TABLET | ORAL | Status: DC

## 2019-08-11 MED ORDER — ASPIRIN EC 81 MG PO TBEC
81.0000 mg | DELAYED_RELEASE_TABLET | Freq: Every day | ORAL | Status: DC
  Administered 2019-08-12 – 2019-08-14 (×3): 81 mg via ORAL
  Filled 2019-08-11 (×3): qty 1

## 2019-08-11 MED ORDER — POTASSIUM CHLORIDE CRYS ER 20 MEQ PO TBCR
40.0000 meq | EXTENDED_RELEASE_TABLET | Freq: Once | ORAL | Status: AC
  Administered 2019-08-11: 40 meq via ORAL
  Filled 2019-08-11: qty 2

## 2019-08-11 MED ORDER — ACETAMINOPHEN 325 MG PO TABS
650.0000 mg | ORAL_TABLET | Freq: Four times a day (QID) | ORAL | Status: DC | PRN
  Administered 2019-08-11: 650 mg via ORAL
  Filled 2019-08-11: qty 2

## 2019-08-11 MED ORDER — NITROGLYCERIN IN D5W 200-5 MCG/ML-% IV SOLN: 0.0000 ug/min | INTRAVENOUS | Status: DC

## 2019-08-11 MED ORDER — ALPRAZOLAM 0.25 MG PO TABS: 0.2500 mg | ORAL_TABLET | Freq: Two times a day (BID) | ORAL | Status: DC | PRN

## 2019-08-11 MED ORDER — FUROSEMIDE 40 MG PO TABS
40.0000 mg | ORAL_TABLET | Freq: Every day | ORAL | Status: DC
  Administered 2019-08-12 – 2019-08-15 (×3): 40 mg via ORAL
  Filled 2019-08-11 (×4): qty 1

## 2019-08-11 MED ORDER — ASPIRIN 81 MG PO CHEW
324.0000 mg | CHEWABLE_TABLET | Freq: Once | ORAL | Status: AC
  Administered 2019-08-11: 324 mg via ORAL
  Filled 2019-08-11: qty 4

## 2019-08-11 NOTE — H&P (Addendum)
Cardiology Admission History and Physical:   Patient ID: Kevin Hart MRN: [id number]; DOB: February 13, 1946   Admission date: 08/11/2019  Primary Care Provider: Carlyle Basques, MD Primary Cardiologist: Peter Martinique, MD  Primary Electrophysiologist:  Thompson Grayer, MD   Chief Complaint:  Chest pain  Patient Profile:   Kevin Hart is a 74 y.o. 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  He underwent DES to LAD for in-stent restenosis in April 2016, EF was 25 to 30% the time.  Repeat echocardiogram in May 2017 showed EF 30 to 35%.  He underwent ICD implantation by Dr. Rayann Heman in September 2016.  He was later diagnosed with obstructive sleep apnea at [facility]  Last Myoview obtained on 10/14/2016 showed EF 34%, no EKG changes, large defect of moderate severity present in the basal inferior, mid anterior, mid inferoseptal, mid inferior, apical anterior, apical septal and apical inferior location consistent with previous scar however very mild peri-infarct ischemia.  Medication titration after multiple PCI has been difficult to given orthostasis and low blood pressure.  He has been referred to to the lipid clinic for consideration of PCSK9 inhibitor and Entresto.  He was eventually started on Entresto in April 2019.  Repeat echocardiogram obtained on 08/18/2017 showed EF 35 to 40%, mild LVH, wall motion abnormality, mild AI, trivial MR.  Last cath 07/27/2018 with non obstructive disease.     History of Present Illness:   Kevin Hart with above hx and in April this year was stable.  On inderal and entresto.  Due low BP and fatigue eplerenone was previously discontinued.  His EF on last echo 35-40%, pt on coumadin for recurrent DVT with lupus anticoagulant positive.  Hx of renal cyst hemorrhage.  He has difficulty with statins.  Dr. Claiborne Billings follows  for Sleep.  Today pt was seen at [place] early AM after pt presented with chest pain, began about 5 am today.  Has had no problems until this AM the pain seems to have wakened him form sleep. Also had paresthesias of the left arm  Pain bleow left nipple and similar to prior anginal pain.   He rated the pain 5-6/10 - he took 3 SL NTG and pain decreased to 2/10.  No associated SOB, diaphoresis, nausea or vomiting.   Currently pain free on IV NTG   EKG:  The ECG that was done today was personally reviewed and demonstrates SR at 85, LAD and Q waves V1-3 similar to 06/22/19 though QTC today is 503 ms.   Na 143, K+ 3.1 BUN 19, Cr 1.32  Hs troponin 23,19 Hgb 14.3 WBC 6.7 plts 180  INR 2.4  COVID neg PCXR  NAD    BP 131/81 P 91 afebrile.  He has rec'd ASA protonix and 40 meq of K+  He was given fentanyl for pain relief.  Now on NTG drip as pain returned.   Pt also admits not always taking kdur.   Past Medical History:  Diagnosis Date  . Arthritis   . Bladder stone 10/26/2017  . BPH (benign prostatic hyperplasia)    takes Flomax daily  . CAD (coronary artery disease)    a. prior LAD stenting;  b. 06/2014 NSTEMI/PCI: LM nl, LAD 30p, 58m ISR (3.0x20 Promus DES), LCX nl, RCA nondom, nl, EF 25-30% ant, apical, dist inf AK.  . Cataracts, bilateral  immature  . Chronic systolic CHF (congestive heart failure) ([place]) 12/31/2014  . DVT (deep venous thrombosis) ([facility])    RECURRENT left leg  . Dyslipidemia   . GERD (gastroesophageal reflux disease)   . Gout   . History of kidney stones   . Hypertension   . Ischemic cardiomyopathy 07/06/2014  . Lupus anticoagulant positive   . Myocardial infarct ([place]) 2000  . Myocardial infarct ([place]) 2016  . S/P drug eluting coronary stent placement, 07/06/14, Promus 07/07/2014  . Sleep apnea    biPaP  . Subarachnoid bleed ([place]) 2015   after falling and hitting his head    Past Surgical History:  Procedure Laterality Date  . COLONOSCOPY    . EP  IMPLANTABLE DEVICE [name] 11/27/2014   St. Jude Medical Fortify Assura VR  implanted by Dr Rayann Heman for primary prevention  . LEFT HEART CATH AND CORONARY ANGIOGRAPHY [name] 07/29/2018   Procedure: LEFT HEART CATH AND CORONARY ANGIOGRAPHY;  Surgeon: Sherren Mocha, MD;  Location: [place] CV [facility];  Service: Cardiovascular;  Laterality: [name];  . LEFT HEART CATHETERIZATION WITH CORONARY ANGIOGRAM [name] 07/06/2014   Procedure: LEFT HEART CATHETERIZATION WITH CORONARY ANGIOGRAM;  Surgeon: Peter M Martinique, MD;  Location[facility] CATH LAB;  Service: Cardiovascular;  Laterality: [name];  . PERCUTANEOUS CORONARY STENT INTERVENTION (PCI-S) Right 07/06/2014   Procedure: PERCUTANEOUS CORONARY STENT INTERVENTION (PCI-S);  Surgeon: Peter M Martinique, MD;  Location[facility] CATH LAB;  Service: Cardiovascular;  Laterality: Right;  . SHOULDER ARTHROSCOPY WITH SUBACROMIAL DECOMPRESSION Left 03/05/2016   Procedure: SHOULDER ARTHROSCOPY ROTATOR CUFF REPAIR WITH SUBACROMIAL DECOMPRESSION;  Surgeon: Tania Ade, MD;  Location: [place];  Service: Orthopedics;  Laterality: Left;  SHOULDER ARTHROSCOPY ROTATOR CUFF REPAIR WITH SUBACROMIAL DECOMPRESSION  . SHOULDER CLOSED REDUCTION Left 11/29/2015   Procedure: CLOSED REDUCTION SHOULDER;  Surgeon: Tania Ade, MD;  Location: [place];  Service: Orthopedics;  Laterality: Left;  . TRANSURETHRAL RESECTION OF PROSTATE [name] 02/24/2018   Procedure: TRANSURETHRAL RESECTION OF THE PROSTATE (TURP);  Surgeon: Irine Seal, MD;  Location: [facility];  Service: Urology;  Laterality: [name];  . WRIST SURGERY Bilateral    plates and screws      Medications Prior to Admission: Prior to Admission medications   Medication Sig Start Date End Date Taking? Authorizing Provider  allopurinol (ZYLOPRIM) 300 MG tablet Take 300 mg by mouth daily.  02/27/16  Yes [provider]  furosemide (LASIX) 40 MG tablet Take 40 mg by mouth daily.    Yes [provider]  Multiple Vitamin (MULTI-VITAMINS) TABS Take 1 tablet by  mouth daily.    Yes [provider]  nitroGLYCERIN (NITROSTAT) 0.4 MG SL tablet Place 1 tablet (0.4 mg total) under the tongue every 5 (five) minutes x 3 doses as needed for chest pain. 12/08/18  Yes Martinique, Peter M, MD  pantoprazole (PROTONIX) 40 MG tablet Take 1 tablet (40 mg total) by mouth daily. 07/07/14  Yes Lendon Colonel, NP  propranolol (INDERAL) 20 MG tablet Take 20 mg by mouth 2 (two) times daily.   Yes [provider]  rosuvastatin (CRESTOR) 20 MG tablet Take 1 tablet (20 mg total) by mouth daily. 12/08/18 12/03/19 Yes Martinique, Peter M, MD  sacubitril-valsartan (ENTRESTO) 24-26 MG TAKE ONE TABLET BY MOUTH TWICE DAILY 12/08/18  Yes Martinique, Peter M, MD  warfarin (COUMADIN) 5 MG tablet Take 0.5-1 tablets (2.5-5 mg total) by mouth See admin instructions. Take 5 mg by mouth on Tuesday, Wednesday, Thursday, Saturday and Sunday then take 2.5 mg by  mouth on Monday and Friday 11/01/17  Yes Mikhail, [place], DO     Allergies:   No Known Allergies  Social History:   Social History   Socioeconomic History  . Marital status: Married    Spouse name: Not on file  . Number of children: 6  . Years of education: Not on file  . Highest education level: Not on file  Occupational History  . Not on file  Tobacco Use  . Smoking status: Never Smoker  . Smokeless tobacco: Never Used  Substance and Sexual Activity  . Alcohol use: Yes    Alcohol/week: 0.0 standard drinks    Comment: seldom   . Drug use: No  . Sexual activity: Yes    Birth control/protection: None  Other Topics Concern  . Not on file  Social History Narrative   Pt lives in [place] with spouse.  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   Social Determinants of Health   Financial Resource Strain:   . Difficulty of Paying Living Expenses:   Food Insecurity:   . Worried About Charity fundraiser in the Last Year:   . Arboriculturist in the Last Year:   Transportation Needs:   . Film/video editor  (Medical):   Marland Kitchen Lack of Transportation (Non-Medical):   Physical Activity:   . Days of Exercise per Week:   . Minutes of Exercise per Session:   Stress:   . Feeling of Stress :   Social Connections:   . Frequency of Communication with Friends and Family:   . Frequency of Social Gatherings with Friends and Family:   . Attends Religious Services:   . Active Member of Clubs or Organizations:   . Attends Archivist Meetings:   Marland Kitchen Marital Status:   Intimate Partner Violence:   . Fear of Current or Ex-Partner:   . Emotionally Abused:   Marland Kitchen Physically Abused:   . Sexually Abused:     Family History:   The patient's family history includes Aneurysm in his father.    ROS:  Please see the history of present illness.  General:no colds or fevers, no weight changes Skin:no rashes or ulcers HEENT:no blurred vision, no congestion CV:see HPI PUL:see HPI GI:no diarrhea constipation or melena, no indigestion GU:no hematuria, no dysuria MS:no joint pain, no claudication Neuro:no syncope, no lightheadedness Endo:no diabetes, no thyroid disease  All other ROS reviewed and negative.     Physical Exam/Data:   Vitals:   08/11/19 1730 08/11/19 1745 08/11/19 1800 08/11/19 1920  BP: 129/82 (!) 141/70 120/65 131/81  Pulse:  89 88   Resp: (!) 23 19 (!) 23 14  Temp:    98.3 F (36.8 C)  TempSrc:    Oral  SpO2:  100% 97% 99%  Weight:    101.7 kg  Height:    5\' 10"  (1.778 m)   No intake or output data in the 24 hours ending 08/11/19 1933 Last 3 Weights 08/11/2019 08/11/2019 06/22/2019  Weight (lbs) 224 lb 3.2 oz 220 lb 224 lb  Weight (kg) 101.696 kg 99.791 kg 101.606 kg     Body mass index is 32.17 kg/m.  General:  Well nourished, well developed, in no acute distress HEENT: normal Lymph: no adenopathy Neck: no JVD Endocrine:  No thryomegaly Vascular: No carotid bruits; pedal pulses 2+ bilaterally   Cardiac:  normal S1, S2; RRR; no murmur gallup rub or click Lungs:  clear to  auscultation bilaterally, no wheezing, rhonchi or  rales  Abd: soft, nontender, no hepatomegaly  Ext: no lower ext edema Musculoskeletal:  No deformities, BUE and BLE strength normal and equal Skin: warm and dry  Neuro:  Alert and oriented X 3 MAE, follows commands no focal abnormalities noted Psych:  Normal affect   Relevant CV Studies: Echo 08/18/2017 LV EF: 35% - 40% Study Conclusions  - Left ventricle: The cavity size was normal. Wall thickness was increased in a pattern of mild LVH. Systolic function was moderately reduced. The estimated ejection fraction was in the range of 35% to 40%. Anterior, anteroseptal, apical and inferoapical hypokinesis. Doppler parameters are consistent with abnormal left ventricular relaxation (grade 1 diastolic dysfunction). The E/e&' ratio is <8, suggesting normal LV filling pressure. - Aortic valve: Sclerosis without stenosis. There was mild regurgitation. - Mitral valve: Mildly thickened leaflets . There was trivial regurgitation. - Left atrium: The atrium was normal in size. - Right ventricle: The cavity size was mildly dilated. Pacer or AICD wire in right ventricle. Systolic function was normal. - Right atrium: The atrium was mildly dilated. Pacer or AICD wire noted in right atrium. - Tricuspid valve: There was trivial regurgitation. - Pulmonary arteries: PA peak pressure: 25 mm Hg (S). - Inferior vena cava: The vessel was normal in size. The respirophasic diameter changes were in the normal range (= 50%), consistent with normal central venous pressure.  Impressions:  - Compared to a prior study in 07/2015, the LVEF has slightly improved to 35-40% with persistent LAD territory wall motion abnormalities.  Cath5/10/2018  Prox LAD to Mid LAD lesion is 30% stenosed.  LV end diastolic pressure is normal.  1. Mild in-stent restenosis in the LAD 2. Widely patent dominant LCx 3. Patent, nondominant  RCA 4. Normal LVEDP  Suspect noncardiac chest pain. Resume warfarin today. Continue medical therapy for secondary risk reduction. OK for discharge home today from cardiac perspective.  Laboratory Data:  High Sensitivity Troponin:   Recent Labs  Lab 08/11/19 0608 08/11/19 0920  TROPONINIHS 23* 19*      Chemistry Recent Labs  Lab 08/11/19 0608  NA 143  K 3.1*  CL 105  CO2 28  GLUCOSE 115*  BUN 19  CREATININE 1.32*  CALCIUM 9.2  GFRNONAA 52*  GFRAA >60  ANIONGAP 10    No results for input(s): PROT, ALBUMIN, AST, ALT, ALKPHOS, BILITOT in the last 168 hours. Hematology Recent Labs  Lab 08/11/19 0608  WBC 6.7  RBC 4.64  HGB 14.3  HCT 41.9  MCV 90.3  MCH 30.8  MCHC 34.1  RDW 15.3  PLT 180   BNPNo results for input(s): BNP, PROBNP in the last 168 hours.  DDimer No results for input(s): DDIMER in the last 168 hours.   Radiology/Studies:  DG Chest Portable 1 View  Result Date: 08/11/2019 CLINICAL DATA:  Chest pain. EXAM: PORTABLE CHEST 1 VIEW COMPARISON:  07/26/2018 FINDINGS: The right ventricular pacer wire/AICD is stable. The cardiac silhouette, mediastinal and hilar contours are within normal limits and unchanged. Coronary artery stent is noted. The lungs are clear. No pleural effusions. No pulmonary lesions. IMPRESSION: No acute cardiopulmonary findings. Electronically Signed   By: Marijo Sanes M.D.   On: 08/11/2019 06:42       HEAR Score (for undifferentiated chest pain):  HEAR Score: 7    Assessment and Plan:   1. [place] with low troponins, though NTG responsive and similar to prior cardiac pain.  Last cath in 07/2018 and he had mild in stent restenosis in LAD.  Otherwise non obstructive CAD.  Pain free on IV NTG if nuc study then continue coumadin but if cath wil need to stop and begin heparin when INR < 2. Hold coumadin heparin for INR <2 and lexiscan myoview in am 2. CAD with prior stents to LAD last cath all stable.  3. ICM with EF at one time 25-30% now  35-40% on entresto 24-26, lasix 40 and inderal 20 BID  4.  st jude ICD for primary prevention. Was normal 05/09/19  5. OSA uses Cpap just changed from Bipap 6. Recurrent DVT with lupus anticoagulant on coumadin.    Severity of Illness: The appropriate patient status for this patient is INPATIENT. Inpatient status is judged to be reasonable and necessary in order to provide the required intensity of service to ensure the patient's safety. The patient's presenting symptoms, physical exam findings, and initial radiographic and laboratory data in the context of their chronic comorbidities is felt to place them at high risk for further clinical deterioration. Furthermore, it is not anticipated that the patient will be medically stable for discharge from the hospital within 2 midnights of admission. The following factors support the patient status of inpatient.   " The patient's presenting symptoms include chest pain. " The worrisome physical exam findings include none. " The initial radiographic and laboratory data are worrisome because of prolonged Qtc. " The chronic co-morbidities include CAD, HTN, DVT .   * I certify that at the point of admission it is my clinical judgment that the patient will require inpatient hospital care spanning beyond 2 midnights from the point of admission due to high intensity of service, high risk for further deterioration and high frequency of surveillance required.*    For questions or updates, please contact [place] Please consult [email or web address] for contact info under   Signed, Cecilie Kicks, NP  08/11/2019 7:33 PM   The patient was seen, examined and discussed with Cecilie Kicks, NP and I agree with the above.   74 y.o. 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 He underwent DES to LAD for in-stent restenosis in April 2016, LVEF 25-30% --> s/p ICD  implantation by Dr. Rayann Heman in September 2016, on [place]. Repeat echocardiogram obtained on 08/18/2017 showed EF 35 to 40%, he underwent a repeat cath for chest pain on 07/27/2018 with mild in stent restenosis in the LAD stent otherwise widely patent arteries,  He presents today with chest pain that is nonexertional, started yesterday, and was associated with left arm numbness.  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  His symptoms were not related to exertion and not associated with shortness of breath however he gets some mild relief with use of sublingual nitroglycerin.  He is currently asymptomatic.  His troponin is negative x2, he has normal creatinine, hemoglobin and platelets.  His wife states that this presentation is very similar to one a year ago when he had mild nonobstructive CAD and patent stent.  He has been asymptomatic until yesterday.  His wife also states that he has been falling recently as a result of balance problem but no syncope.  On one occasion he hit his head and underwent head CT that was negative for intracranial bleed.  Physical exam reveals gentleman no acute distress, with no JVDs, no carotid bruits, S1-S2 no significant murmur, lungs clear to auscultation, warm  extremities with palpable peripheral pulses. He is EKG shows sinus rhythm with Q waves in anterior and inferior leads and nonspecific ST-T wave abnormalities unchanged from prior.  Assessment and plan  Atypical angina, similar to last year presentation when he had mild nonobstructive CAD.  With sublingual nitroglycerin, will proceed with Lexiscan nuclear stress test tomorrow and if normal discharge.  We will hold Coumadin and if stress test is abnormal we will continue holding and start heparin otherwise restart Coumadin tomorrow after the stress test. We might consider adding low-dose of Imdur to his regimen.  Overall he is not a great candidate for  repeat Cath, he responds to many questions I do not know I do not remember suspicious of dementia, also he has been having frequent falls recently.  I will replace potassium.  Continue sublingual nitroglycerin overnight start Imdur in the morning.  Ena Dawley, MD 08/11/2019

## 2019-08-11 NOTE — ED Triage Notes (Signed)
  Patient comes in with chest pain that started around 0500 this morning.  Patient has hx of 2 heart attacks and has implanted defibrillator.  No SOB, nausea, or diaphoresis.  Patient took 3 nitro tabs at home before coming in to hospital.  Pain 3-4.

## 2019-08-11 NOTE — ED Provider Notes (Addendum)
[facility] DEPT [facility] Provider Note: Kevin Spurling, MD, FACEP  CSN: [id number] MRN: [id number] ARRIVAL: 08/11/19 at [place]: [place]  Chest Pain   HISTORY OF PRESENT ILLNESS  08/11/19 6:07 AM Kevin Hart is a 74 y.o. male with a known history of coronary artery disease status post myocardial infarction x2 with prior stenting of the LAD x3 and placement of a defibrillator.  He is here with chest pain that began yesterday and has been coming and going.  It returned about 5 AM this morning with associated paresthesias of the left arm.  It is located below his left nipple and is described as being like previous anginal/MI pain.  It was a 5-6 out of 10 at its worst.  After taking 3 sublingual nitroglycerin tablets prior to arrival he rates it as a 2 out of 10.  He denies associated shortness of breath, diaphoresis, nausea or vomiting.  He has a known history of frequent PVCs.   Past Medical History:  Diagnosis Date  . Arthritis   . Bladder stone 10/26/2017  . BPH (benign prostatic hyperplasia)    takes Flomax daily  . CAD (coronary artery disease)    a. prior LAD stenting;  b. 06/2014 NSTEMI/PCI: LM nl, LAD 30p, 61m ISR (3.0x20 Promus DES), LCX nl, RCA nondom, nl, EF 25-30% ant, apical, dist inf AK.  . Cataracts, bilateral    immature  . Chronic systolic CHF (congestive heart failure) ([place]) 12/31/2014  . DVT (deep venous thrombosis) (HCC)    RECURRENT left leg  . Dyslipidemia   . GERD (gastroesophageal reflux disease)   . Gout   . History of kidney stones   . Hypertension   . Ischemic cardiomyopathy 07/06/2014  . Lupus anticoagulant positive   . Myocardial infarct ([place]) 2000  . Myocardial infarct ([place]) 2016  . S/P drug eluting coronary stent placement, 07/06/14, Promus 07/07/2014  . Sleep apnea    biPaP  . Subarachnoid bleed ([place]) 2015   after falling and hitting his head    Past Surgical History:  Procedure Laterality Date  . COLONOSCOPY    . EP  IMPLANTABLE DEVICE [name] 11/27/2014   St. Jude Medical Fortify Assura VR  implanted by Dr Rayann Heman for primary prevention  . LEFT HEART CATH AND CORONARY ANGIOGRAPHY [name] 07/29/2018   Procedure: LEFT HEART CATH AND CORONARY ANGIOGRAPHY;  Surgeon: Sherren Mocha, MD;  Location: [facility] CV LAB;  Service: Cardiovascular;  Laterality: [name];  . LEFT HEART CATHETERIZATION WITH CORONARY ANGIOGRAM [name] 07/06/2014   Procedure: LEFT HEART CATHETERIZATION WITH CORONARY ANGIOGRAM;  Surgeon: Peter M Martinique, MD;  Location: [facility] CATH LAB;  Service: Cardiovascular;  Laterality: [name];  . PERCUTANEOUS CORONARY STENT INTERVENTION (PCI-S) Right 07/06/2014   Procedure: PERCUTANEOUS CORONARY STENT INTERVENTION (PCI-S);  Surgeon: Peter M Martinique, MD;  Location: [facility] CATH LAB;  Service: Cardiovascular;  Laterality: Right;  . SHOULDER ARTHROSCOPY WITH SUBACROMIAL DECOMPRESSION Left 03/05/2016   Procedure: SHOULDER ARTHROSCOPY ROTATOR CUFF REPAIR WITH SUBACROMIAL DECOMPRESSION;  Surgeon: Tania Ade, MD;  Location: [facility] [place];  Service: Orthopedics;  Laterality: Left;  SHOULDER ARTHROSCOPY ROTATOR CUFF REPAIR WITH SUBACROMIAL DECOMPRESSION  . SHOULDER CLOSED REDUCTION Left 11/29/2015   Procedure: CLOSED REDUCTION SHOULDER;  Surgeon: Tania Ade, MD;  Location: [place];  Service: Orthopedics;  Laterality: Left;  . TRANSURETHRAL RESECTION OF PROSTATE [name] 02/24/2018   Procedure: TRANSURETHRAL RESECTION OF THE PROSTATE (TURP);  Surgeon: Irine Seal, MD;  Location: [facility] ORS;  Service: Urology;  Laterality: [name];  .  WRIST SURGERY Bilateral    plates and screws     Family History  Problem Relation Age of Onset  . Aneurysm Father     Social History   Tobacco Use  . Smoking status: Never Smoker  . Smokeless tobacco: Never Used  Substance Use Topics  . Alcohol use: Yes    Alcohol/week: 0.0 standard drinks    Comment: seldom   . Drug use: No    Prior to Admission medications   Medication Sig Start Date End Date Taking? Authorizing  Provider  allopurinol (ZYLOPRIM) 300 MG tablet Take 300 mg by mouth daily.  02/27/16   [provider]  furosemide (LASIX) 40 MG tablet Take 40 mg by mouth daily.     [provider]  Multiple Vitamin (MULTI-VITAMINS) TABS Take 1 tablet by mouth daily.     [provider]  nitroGLYCERIN (NITROSTAT) 0.4 MG SL tablet Place 1 tablet (0.4 mg total) under the tongue every 5 (five) minutes x 3 doses as needed for chest pain. 12/08/18   Martinique, Peter M, MD  pantoprazole (PROTONIX) 40 MG tablet Take 1 tablet (40 mg total) by mouth daily. 07/07/14   Lendon Colonel, NP  propranolol (INDERAL) 20 MG tablet Take 2 tablets by mouth daily. 06/19/19   [provider]  rosuvastatin (CRESTOR) 20 MG tablet Take 1 tablet (20 mg total) by mouth daily. 12/08/18 12/03/19  Martinique, Peter M, MD  sacubitril-valsartan (ENTRESTO) 24-26 MG TAKE ONE TABLET BY MOUTH TWICE DAILY 12/08/18   Martinique, Peter M, MD  warfarin (COUMADIN) 5 MG tablet Take 0.5-1 tablets (2.5-5 mg total) by mouth See admin instructions. Take 5 mg by mouth on Tuesday, Wednesday, Thursday, Saturday and Sunday then take 2.5 mg by mouth on Monday and Friday 11/01/17   Cristal Ford, DO    Allergies Patient has no known allergies.   REVIEW OF SYSTEMS  Negative except as noted here or in the History of Present Illness.   PHYSICAL EXAMINATION  Initial Vital Signs Height 5\' 10"  (1.778 m), weight 99.8 kg.  Examination General: Well-developed, well-nourished male in no acute distress; appearance consistent with age of record HENT: normocephalic; atraumatic Eyes: pupils equal, round and reactive to light; extraocular muscles intact; bilateral pseudophakia Neck: supple Heart: regular rate and rhythm; frequent PVCs Lungs: clear to auscultation bilaterally Abdomen: soft; nondistended; nontender; bowel sounds present Extremities: No deformity; full range of motion; pulses normal Neurologic: Awake, alert and oriented; motor  function intact in all extremities and symmetric; no facial droop Skin: Warm and dry; multiple lesions consistent with seborrheic keratoses Psychiatric: Anxious   RESULTS  Summary of this visit's results, reviewed and interpreted by myself:   EKG Interpretation  Date/Time:  Friday Aug 11 2019 06:03:27 EDT Ventricular Rate:  85 PR Interval:    QRS Duration: 84 QT Interval:  423 QTC Calculation: 503 R Axis:   -28 Text Interpretation: Sinus rhythm Borderline left axis deviation Anterior infarct, old Nonspecific T abnormalities, lateral leads Prolonged QT interval No significant change was found Confirmed by Shanon Rosser [phone]) on 08/11/2019 6:07:15 AM      Laboratory Studies: Results for orders placed or performed during the hospital encounter of 08/11/19 (from the past 24 hour(s))  Basic metabolic panel     Status: Abnormal   Collection Time: 08/11/19  6:08 AM  Result Value Ref Range   Sodium 143 135 - 145 mmol/L   Potassium 3.1 (L) 3.5 - 5.1 mmol/L   Chloride 105 98 - 111 mmol/L  CO2 28 22 - 32 mmol/L   Glucose, Bld 115 (H) 70 - 99 mg/dL   BUN 19 8 - 23 mg/dL   Creatinine, Ser 1.32 (H) 0.61 - 1.24 mg/dL   Calcium 9.2 8.9 - 10.3 mg/dL   GFR calc non Af Amer 52 (L) >60 mL/min   GFR calc Af Amer >60 >60 mL/min   Anion gap 10 5 - 15  Troponin I (High Sensitivity)     Status: Abnormal   Collection Time: 08/11/19  6:08 AM  Result Value Ref Range   Troponin I (High Sensitivity) 23 (H) <18 ng/L  CBC with Differential/Platelet     Status: None   Collection Time: 08/11/19  6:08 AM  Result Value Ref Range   WBC 6.7 4.0 - 10.5 K/uL   RBC 4.64 4.22 - 5.81 MIL/uL   Hemoglobin 14.3 13.0 - 17.0 g/dL   HCT 41.9 39.0 - 52.0 %   MCV 90.3 80.0 - 100.0 fL   MCH 30.8 26.0 - 34.0 pg   MCHC 34.1 30.0 - 36.0 g/dL   RDW 15.3 11.5 - 15.5 %   Platelets 180 150 - 400 K/uL   nRBC 0.0 0.0 - 0.2 %   Neutrophils Relative % 52 %   Neutro Abs 3.4 1.7 - 7.7 K/uL   Lymphocytes Relative 31 %    Lymphs Abs 2.1 0.7 - 4.0 K/uL   Monocytes Relative 12 %   Monocytes Absolute 0.8 0.1 - 1.0 K/uL   Eosinophils Relative 3 %   Eosinophils Absolute 0.2 0.0 - 0.5 K/uL   Basophils Relative 1 %   Basophils Absolute 0.1 0.0 - 0.1 K/uL   Immature Granulocytes 1 %   Abs Immature Granulocytes 0.03 0.00 - 0.07 K/uL  Protime-INR     Status: Abnormal   Collection Time: 08/11/19  6:08 AM  Result Value Ref Range   Prothrombin Time 25.4 (H) 11.4 - 15.2 seconds   INR 2.4 (H) 0.8 - 1.2  CBG monitoring, ED     Status: None   Collection Time: 08/11/19  6:40 AM  Result Value Ref Range   Glucose-Capillary 96 70 - 99 mg/dL   Imaging Studies: DG Chest Portable 1 View  Result Date: 08/11/2019 CLINICAL DATA:  Chest pain. EXAM: PORTABLE CHEST 1 VIEW COMPARISON:  07/26/2018 FINDINGS: The right ventricular pacer wire/AICD is stable. The cardiac silhouette, mediastinal and hilar contours are within normal limits and unchanged. Coronary artery stent is noted. The lungs are clear. No pleural effusions. No pulmonary lesions. IMPRESSION: No acute cardiopulmonary findings. Electronically Signed   By: Marijo Sanes M.D.   On: 08/11/2019 06:42    ED COURSE and MDM  Nursing notes, initial and subsequent vitals signs, including pulse oximetry, reviewed and interpreted by myself.  Vitals:   08/11/19 0615 08/11/19 0630 08/11/19 0645 08/11/19 0700  BP: 115/75 116/80 105/74 110/76  Pulse: 85 80 76 78  Resp: 14 17 16 18   Temp:      TempSrc:      SpO2: 99% 97% 96% 96%  Weight:      Height:       Medications  nitroGLYCERIN 50 mg in dextrose 5 % 250 mL (0.2 mg/mL) infusion (5 mcg/min Intravenous New Bag/Given 08/11/19 0636)  fentaNYL (SUBLIMAZE) injection 25 mcg (25 mcg Intravenous Given 08/11/19 0619)  potassium chloride SA (KLOR-CON) CR tablet 40 mEq (has no administration in time range)  aspirin chewable tablet 324 mg (324 mg Oral Given 08/11/19 0618)   6:12 AM  Patient given aspirin 325 mg and started on  nitroglycerin drip.  Cardiac order set initiated.  No STEMI seen on EKG yet.  6:54 AM Patient's first troponin is mildly elevated.  He is already on Coumadin with a therapeutic INR of 2.4.  His pain is now "virtually nonexistent" on nitroglycerin drip.   7:05 AM Dr. Candee Furbish accepts patient for admission to the [facility] cardiology service.   PROCEDURES  Procedures  CRITICAL CARE Performed by: Karen Chafe Marcina Kinnison Total critical care time: 45 minutes Critical care time was exclusive of separately billable procedures and treating other patients. Critical care was necessary to treat or prevent imminent or life-threatening deterioration. Critical care was time spent personally by me on the following activities: development of treatment plan with patient and/or surrogate as well as nursing, discussions with consultants, evaluation of patient's response to treatment, examination of patient, obtaining history from patient or surrogate, ordering and performing treatments and interventions, ordering and review of laboratory studies, ordering and review of radiographic studies, pulse oximetry and re-evaluation of patient's condition.   ED DIAGNOSES     ICD-10-CM   1. Unstable angina (HCC)  I20.0   2. Hypokalemia  E87.6        Lera Gaines, Jenny Reichmann, MD 08/11/19 [id number]    Shanon Rosser, MD 08/11/19 [id number]    Shanon Rosser, MD 08/11/19 [id number]

## 2019-08-11 NOTE — Progress Notes (Signed)
RT placed patient on BiPAP based on patient's knowledge and chart review settings. BiPAP minimum EPAP 10 cmH2O and Max IPAP 20 via FFM. Patient stated that he was comfortable with the fit and the pressure.  Patient stated he is able to take on/off as the mask is similar to his home mask.

## 2019-08-11 NOTE — Progress Notes (Signed)
[facility] for heparin Indication: chest pain/ACS and history of multiple DVT, positive lupus anticoagulant  Heparin Dosing Weight: 94.4 kg  Labs: Recent Labs    08/11/19 0608  HGB 14.3  HCT 41.9  PLT 180  LABPROT 25.4*  INR 2.4*  CREATININE 1.32*    Assessment: 21 yom with history of multiple DVT, positive lupus anticoagulation on warfarin PTA presenting with CP. Pharmacy consulted to dose heparin for ACS when INR<2. INR 2.4 on admit, CBC wnl. Noted history of SAH in 2015 but no current active bleed issues reported.  Goal of Therapy:  Heparin level 0.3-0.7 units/ml Monitor platelets by anticoagulation protocol: Yes   Plan:  Start heparin when INR<2 Monitor daily INR, CBC, s/sx bleeding F/u Cardiology plans for cath   Elicia Lamp, PharmD, BCPS Clinical Pharmacist 08/11/2019 8:52 PM

## 2019-08-11 NOTE — ED Notes (Signed)
Called bed placement and spoke to Shaun who stated that there are no beds available at this time

## 2019-08-11 NOTE — ED Notes (Signed)
Tried to call report and I spoke to [employer], Network engineer.  Receiving nurse is currently with another pt.  Awaiting for Sam to call me back.

## 2019-08-11 NOTE — ED Notes (Signed)
Called Bed Placement spoke to Shirlean Mylar who said they are waiting on discharges to take place

## 2019-08-12 ENCOUNTER — Inpatient Hospital Stay (HOSPITAL_COMMUNITY): Payer: PPO

## 2019-08-12 DIAGNOSIS — G4733 Obstructive sleep apnea (adult) (pediatric): Secondary | ICD-10-CM

## 2019-08-12 DIAGNOSIS — Z9989 Dependence on other enabling machines and devices: Secondary | ICD-10-CM

## 2019-08-12 DIAGNOSIS — Z95810 Presence of automatic (implantable) cardiac defibrillator: Secondary | ICD-10-CM

## 2019-08-12 DIAGNOSIS — I361 Nonrheumatic tricuspid (valve) insufficiency: Secondary | ICD-10-CM

## 2019-08-12 DIAGNOSIS — I82409 Acute embolism and thrombosis of unspecified deep veins of unspecified lower extremity: Secondary | ICD-10-CM

## 2019-08-12 DIAGNOSIS — E876 Hypokalemia: Secondary | ICD-10-CM

## 2019-08-12 DIAGNOSIS — I341 Nonrheumatic mitral (valve) prolapse: Secondary | ICD-10-CM

## 2019-08-12 DIAGNOSIS — I25110 Atherosclerotic heart disease of native coronary artery with unstable angina pectoris: Principal | ICD-10-CM

## 2019-08-12 DIAGNOSIS — R079 Chest pain, unspecified: Secondary | ICD-10-CM

## 2019-08-12 DIAGNOSIS — I340 Nonrheumatic mitral (valve) insufficiency: Secondary | ICD-10-CM

## 2019-08-12 DIAGNOSIS — R072 Precordial pain: Secondary | ICD-10-CM

## 2019-08-12 DIAGNOSIS — I5022 Chronic systolic (congestive) heart failure: Secondary | ICD-10-CM

## 2019-08-12 LAB — NM MYOCAR MULTI W/SPECT W/WALL MOTION / EF
Estimated workload: 1 METS
Exercise duration (min): 0 min
Exercise duration (sec): 0 s
MPHR: 145 {beats}/min
Peak HR: 100 {beats}/min
Percent HR: 68 %
Rest HR: 75 {beats}/min

## 2019-08-12 LAB — BASIC METABOLIC PANEL
Anion gap: 8 (ref 5–15)
BUN: 14 mg/dL (ref 8–23)
CO2: 26 mmol/L (ref 22–32)
Calcium: 9.4 mg/dL (ref 8.9–10.3)
Chloride: 109 mmol/L (ref 98–111)
Creatinine, Ser: 0.99 mg/dL (ref 0.61–1.24)
GFR calc Af Amer: >60 mL/min (ref >60)
GFR calc non Af Amer: >60 mL/min (ref >60)
Glucose, Bld: 115 mg/dL — ABNORMAL HIGH (ref 70–99)
Potassium: 3.3 mmol/L — ABNORMAL LOW (ref 3.5–5.1)
Sodium: 143 mmol/L (ref 135–145)

## 2019-08-12 LAB — LIPID PANEL
Cholesterol: 169 mg/dL (ref 0–200)
HDL: 39 mg/dL — ABNORMAL LOW (ref >40)
LDL Cholesterol: 106 mg/dL — ABNORMAL HIGH (ref 0–99)
Total CHOL/HDL Ratio: 4.3 RATIO
Triglycerides: 118 mg/dL (ref <150)
VLDL: 24 mg/dL (ref 0–40)

## 2019-08-12 LAB — CBC
HCT: 39.9 % (ref 39.0–52.0)
Hemoglobin: 13.5 g/dL (ref 13.0–17.0)
MCH: 31 pg (ref 26.0–34.0)
MCHC: 33.8 g/dL (ref 30.0–36.0)
MCV: 91.7 fL (ref 80.0–100.0)
Platelets: 166 10*3/uL (ref 150–400)
RBC: 4.35 MIL/uL (ref 4.22–5.81)
RDW: 15.1 % (ref 11.5–15.5)
WBC: 5.5 10*3/uL (ref 4.0–10.5)
nRBC: 0 % (ref 0.0–0.2)

## 2019-08-12 LAB — ECHOCARDIOGRAM COMPLETE
Height: 70 in
Weight: 3587.2 oz

## 2019-08-12 LAB — PROTIME-INR
INR: 2.6 — ABNORMAL HIGH (ref 0.8–1.2)
Prothrombin Time: 27.2 seconds — ABNORMAL HIGH (ref 11.4–15.2)

## 2019-08-12 MED ORDER — TECHNETIUM TC 99M TETROFOSMIN IV KIT
31.1000 | PACK | Freq: Once | INTRAVENOUS | Status: AC | PRN
  Administered 2019-08-12: 31.1 via INTRAVENOUS

## 2019-08-12 MED ORDER — REGADENOSON 0.4 MG/5ML IV SOLN
INTRAVENOUS | Status: AC
  Filled 2019-08-12: qty 5

## 2019-08-12 MED ORDER — TECHNETIUM TC 99M TETROFOSMIN IV KIT
10.6000 | PACK | Freq: Once | INTRAVENOUS | Status: AC | PRN
  Administered 2019-08-12: 10.6 via INTRAVENOUS

## 2019-08-12 MED ORDER — REGADENOSON 0.4 MG/5ML IV SOLN
0.4000 mg | Freq: Once | INTRAVENOUS | Status: AC
  Administered 2019-08-12: 0.4 mg via INTRAVENOUS
  Filled 2019-08-12: qty 5

## 2019-08-12 MED ORDER — POTASSIUM CHLORIDE CRYS ER 20 MEQ PO TBCR
20.0000 meq | EXTENDED_RELEASE_TABLET | Freq: Every day | ORAL | Status: DC
  Administered 2019-08-12 – 2019-08-15 (×4): 20 meq via ORAL
  Filled 2019-08-12 (×4): qty 1

## 2019-08-12 NOTE — Progress Notes (Signed)
Progress Note  Patient Name: Kevin Hart Date of Encounter: 08/12/2019  Primary Cardiologist: Peter Martinique, MD   Subjective   Has had mild intermittent chest pains overnight.  Denies palpitations, shortness of breath, and leg swelling.  Inpatient Medications    Scheduled Meds: . allopurinol  300 mg Oral Daily  . aspirin EC  81 mg Oral Daily  . furosemide  40 mg Oral Daily  . pantoprazole  40 mg Oral Daily  . potassium chloride  20 mEq Oral Daily  . propranolol  20 mg Oral BID  . rosuvastatin  20 mg Oral Daily  . sacubitril-valsartan  1 tablet Oral BID   Continuous Infusions: . nitroGLYCERIN 5 mcg/min (08/11/19 0636)   PRN Meds: acetaminophen, ALPRAZolam, fentaNYL (SUBLIMAZE) injection, nitroGLYCERIN, ondansetron (ZOFRAN) IV   Vital Signs    Vitals:   08/11/19 1920 08/11/19 2220 08/12/19 0019 08/12/19 0419  BP: 131/81 107/64 114/74 106/75  Pulse:   87 73  Resp: 14 13 15 13   Temp: 98.3 F (36.8 C)  98.8 F (37.1 C) 98.3 F (36.8 C)  TempSrc: Oral  Oral Oral  SpO2: 99%  97% 96%  Weight: 101.7 kg     Height: 5\' 10"  (1.778 m)       Intake/Output Summary (Last 24 hours) at 08/12/2019 0838 Last data filed at 08/12/2019 0500 Gross per 24 hour  Intake 135 ml  Output 500 ml  Net -365 ml   Last 3 Weights 08/12/2019 08/11/2019 08/11/2019  Weight (lbs) (No Data) 224 lb 3.2 oz 220 lb  Weight (kg) (No Data) 101.696 kg 99.791 kg      Telemetry    Sinus rhythm with PVCs- Personally Reviewed  ECG    Sinus rhythm with nonspecific T wave abnormalities- Personally Reviewed  Physical Exam   GEN: No acute distress.   Neck: No JVD Cardiac: RRR, no murmurs, rubs, or gallops.  Respiratory: Clear to auscultation bilaterally. GI: Soft, nontender, non-distended  MS: No edema; No deformity. Neuro:  Nonfocal  Psych: Normal affect   Labs    High Sensitivity Troponin:   Recent Labs  Lab 08/11/19 0608 08/11/19 0920  TROPONINIHS 23* 19*      Chemistry Recent  Labs  Lab 08/11/19 0608 08/11/19 0608 08/11/19 2056 08/11/19 2130 08/12/19 0423  NA 143  --  139  --  143  K 3.1*  --  3.8  --  3.3*  CL 105  --  104  --  109  CO2 28  --  25  --  26  GLUCOSE 115*  --  151*  --  115*  BUN 19  --  13  --  14  CREATININE 1.32*   < > 1.08 1.06 0.99  CALCIUM 9.2  --  9.2  --  9.4  GFRNONAA 52*   < > >60 >60 >60  GFRAA >60   < > >60 >60 >60  ANIONGAP 10  --  10  --  8   < > = values in this interval not displayed.     Hematology Recent Labs  Lab 08/11/19 0608 08/11/19 2130 08/12/19 0423  WBC 6.7 5.8 5.5  RBC 4.64 4.51 4.35  HGB 14.3 13.9 13.5  HCT 41.9 41.1 39.9  MCV 90.3 91.1 91.7  MCH 30.8 30.8 31.0  MCHC 34.1 33.8 33.8  RDW 15.3 15.0 15.1  PLT 180 174 166    BNPNo results for input(s): BNP, PROBNP in the last 168 hours.   DDimer No  results for input(s): DDIMER in the last 168 hours.   Radiology    DG Chest Portable 1 View  Result Date: 08/11/2019 CLINICAL DATA:  Chest pain. EXAM: PORTABLE CHEST 1 VIEW COMPARISON:  07/26/2018 FINDINGS: The right ventricular pacer wire/AICD is stable. The cardiac silhouette, mediastinal and hilar contours are within normal limits and unchanged. Coronary artery stent is noted. The lungs are clear. No pleural effusions. No pulmonary lesions. IMPRESSION: No acute cardiopulmonary findings. Electronically Signed   By: Marijo Sanes M.D.   On: 08/11/2019 06:42    Cardiac Studies   Echo 08/18/2017 LV EF: 35% - 40% Study Conclusions  - Left ventricle: The cavity size was normal. Wall thickness was increased in a pattern of mild LVH. Systolic function was moderately reduced. The estimated ejection fraction was in the range of 35% to 40%. Anterior, anteroseptal, apical and inferoapical hypokinesis. Doppler parameters are consistent with abnormal left ventricular relaxation (grade 1 diastolic dysfunction). The E/e&' ratio is <8, suggesting normal LV filling pressure. - Aortic valve:  Sclerosis without stenosis. There was mild regurgitation. - Mitral valve: Mildly thickened leaflets . There was trivial regurgitation. - Left atrium: The atrium was normal in size. - Right ventricle: The cavity size was mildly dilated. Pacer or AICD wire in right ventricle. Systolic function was normal. - Right atrium: The atrium was mildly dilated. Pacer or AICD wire noted in right atrium. - Tricuspid valve: There was trivial regurgitation. - Pulmonary arteries: PA peak pressure: 25 mm Hg (S). - Inferior vena cava: The vessel was normal in size. The respirophasic diameter changes were in the normal range (= 50%), consistent with normal central venous pressure.  Impressions:  - Compared to a prior study in 07/2015, the LVEF has slightly improved to 35-40% with persistent LAD territory wall motion abnormalities.  Cath5/10/2018  Prox LAD to Mid LAD lesion is 30% stenosed.  LV end diastolic pressure is normal.  1. Mild in-stent restenosis in the LAD 2. Widely patent dominant LCx 3. Patent, nondominant RCA 4. Normal LVEDP  Suspect noncardiac chest pain. Resume warfarin today. Continue medical therapy for secondary risk reduction. OK for discharge home today from cardiac perspective.  Patient Profile     74 y.o. 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 He underwent DES to LAD for in-stent restenosis in April 2016, EF was 25 to 30% the time. Repeat echocardiogram in May 2017 showed EF 30 to 35%. He underwent ICD implantation by Dr. Rayann Heman in September 2016. He was later diagnosed with obstructive sleep apnea at [facility]. Last Myoview obtained on 10/14/2016 showed EF 34%, no EKG changes, large defect of moderate severity present in the basal inferior, mid anterior, mid inferoseptal, mid inferior,  apical anterior, apical septal and apical inferior location consistent with previous scar however very mild peri-infarct ischemia. Medication titration after multiple PCI has been difficult to given orthostasis and low blood pressure. He has been referred to to the lipid clinic for consideration of PCSK9 inhibitor and Entresto. He was eventually started on Entresto in April 2019. Repeat echocardiogram obtained on 08/18/2017 showed EF 35 to 40%, mild LVH, wall motion abnormality, mild AI, trivial MR.  Last cath 07/27/2018 with non obstructive disease.     Assessment & Plan    1.  Chest pain/coronary artery disease: There was some thought on admission that chest pain may represent  unstable angina.  Currently on IV heparin and event cardiac catheterization is needed.  Also on low-dose IV nitroglycerin.  Symptoms are similar to prior cardiac pain.  Last cardiac catheterization in May 2020 demonstrated mild in-stent restenosis in the LAD.  There was otherwise nonobstructive coronary artery disease.  Plans are for nuclear stress test today.  Echocardiogram is about to be performed.  2.  Chronic systolic heart failure: LVEF 35 to 40% by most recent echocardiogram.  Another echocardiogram is about to be performed.  Currently on Entresto 24-26 mg twice daily and Lasix 40 mg daily along with Inderal 20 mg twice daily.  3.  ICD: Stable without shocks.  4.  Obstructive sleep apnea: Uses CPAP.  5.  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  6.  Hypokalemia: I will start supplemental potassium chloride daily.  For questions or updates, please contact [place] Please consult [email or web address] for contact info under        Signed, Kate Sable, MD  08/12/2019, 8:38 AM

## 2019-08-12 NOTE — Progress Notes (Signed)
  Echocardiogram 2D Echocardiogram has been performed.  Kevin Hart 08/12/2019, 9:25 AM

## 2019-08-12 NOTE — Progress Notes (Signed)
   Kevin Hart presented for a nuclear stress test today.  No immediate complications.  Stress imaging is pending at this time.  Preliminary EKG findings may be listed in the chart, but the stress test result will not be finalized until perfusion imaging is complete.   Abigail Butts, PA-C 08/12/2019, 12:00 PM

## 2019-08-12 NOTE — Progress Notes (Addendum)
ANTICOAGULATION CONSULT NOTE  Pharmacy Consult for heparin Indication: chest pain/ACS and history of multiple DVT, positive lupus anticoagulant  Heparin Dosing Weight: 94.4 kg  Labs: Recent Labs    08/11/19 0608 08/11/19 0608 08/11/19 2056 08/11/19 2130 08/12/19 0423  HGB 14.3   < >  --  13.9 13.5  HCT 41.9  --   --  41.1 39.9  PLT 180  --   --  174 166  LABPROT 25.4*  --   --   --  27.2*  INR 2.4*  --   --   --  2.6*  CREATININE 1.32*   < > 1.08 1.06 0.99   < > = values in this interval not displayed.    Assessment: 74 year old male with history of multiple DVT, positive lupus anticoagulation on warfarin prior to admission, presented with chest pain 5/21.   Home warfarin regimen: 2.5 mg daily  Pharmacy consulted to dose heparin for ACS when INR<2. INR now 2.6. CBC remains WNL. Noted history of SAH in 2015 but no current active bleed issues reported.  Goal of Therapy:  Heparin level 0.3-0.7 units/ml Monitor platelets by anticoagulation protocol: Yes   Plan:  - Continue to hold prior to admission warfarin - Start heparin when INR<2 - Monitor daily INR, CBC, s/sx bleeding - Follow-up Cardiology plans for cath     Carnie Bruemmer L. Devin Going, [place] PGY1 Pharmacy Resident [phone] 08/12/19     9:03 AM  Please check AMION for all [place] phone numbers After 10:00 PM, call the [place] [facility] [phone]

## 2019-08-12 NOTE — Progress Notes (Signed)
   NST reviewed with Dr. Bronson Ing. Given drop in EF and high risk study (though no clear ischemia), feel he would be better evaluated with a cardiac catheterization. Will tentatively plan for a cath on Monday. Low threshold for a heparin gtt if patient develops recurrent chest pain.   Abigail Butts, PA-C 08/12/19; 5:29 PM

## 2019-08-13 LAB — CBC
HCT: 45.3 % (ref 39.0–52.0)
Hemoglobin: 15.2 g/dL (ref 13.0–17.0)
MCH: 31.1 pg (ref 26.0–34.0)
MCHC: 33.6 g/dL (ref 30.0–36.0)
MCV: 92.8 fL (ref 80.0–100.0)
Platelets: 183 10*3/uL (ref 150–400)
RBC: 4.88 MIL/uL (ref 4.22–5.81)
RDW: 15.3 % (ref 11.5–15.5)
WBC: 5.8 10*3/uL (ref 4.0–10.5)
nRBC: 0 % (ref 0.0–0.2)

## 2019-08-13 LAB — PROTIME-INR
INR: 2.4 — ABNORMAL HIGH (ref 0.8–1.2)
Prothrombin Time: 25 seconds — ABNORMAL HIGH (ref 11.4–15.2)

## 2019-08-13 NOTE — Plan of Care (Signed)
  Problem: Clinical Measurements: Goal: Ability to maintain clinical measurements within normal limits will improve Outcome: Progressing   Problem: Nutrition: Goal: Adequate nutrition will be maintained Outcome: Completed/Met

## 2019-08-13 NOTE — Progress Notes (Signed)
Progress Note  Patient Name: Kevin Hart Date of Encounter: 08/13/2019  Primary Cardiologist: Peter Martinique, MD   Subjective   Has had some intermittent, mild chest pains overnight.  I had a discussion with both the patient and his wife by speakerphone.  LVEF has declined from 35 to 40% to 25 to 30%.  No significant ischemic territories seen on stress testing.  His wife said that "he often feels like a dish rag lately ".  I talked about the plan to pursue cardiac catheterization tomorrow.  They are both in agreement.  Inpatient Medications    Scheduled Meds: . allopurinol  300 mg Oral Daily  . aspirin EC  81 mg Oral Daily  . furosemide  40 mg Oral Daily  . pantoprazole  40 mg Oral Daily  . potassium chloride  20 mEq Oral Daily  . propranolol  20 mg Oral BID  . rosuvastatin  20 mg Oral Daily  . sacubitril-valsartan  1 tablet Oral BID   Continuous Infusions: . nitroGLYCERIN 5 mcg/min (08/11/19 0636)   PRN Meds: acetaminophen, ALPRAZolam, fentaNYL (SUBLIMAZE) injection, nitroGLYCERIN, ondansetron (ZOFRAN) IV   Vital Signs    Vitals:   08/12/19 2015 08/13/19 0329 08/13/19 0700 08/13/19 0939  BP: 110/64 106/65 114/72 109/70  Pulse: 68 63 94 80  Resp: 17 15 18    Temp: 98.5 F (36.9 C) 97.8 F (36.6 C) 98.7 F (37.1 C)   TempSrc: Oral Oral    SpO2: 97% 98% 92%   Weight:  100.2 kg    Height:        Intake/Output Summary (Last 24 hours) at 08/13/2019 1057 Last data filed at 08/12/2019 2000 Gross per 24 hour  Intake 460 ml  Output 1925 ml  Net -1465 ml   Last 3 Weights 08/13/2019 08/12/2019 08/11/2019  Weight (lbs) 220 lb 14.4 oz (No Data) 224 lb 3.2 oz  Weight (kg) 100.2 kg (No Data) 101.696 kg      Telemetry    Sinus rhythm with PVCs- Personally Reviewed  ECG    Sinus rhythm with nonspecific T wave abnormalities (08/12/19)- Personally Reviewed  Physical Exam   GEN: No acute distress.   Neck: No JVD Cardiac: RRR, no murmurs, rubs, or gallops.    Respiratory: Clear to auscultation bilaterally. GI: Soft, nontender, non-distended  MS: No edema; No deformity. Neuro:  Nonfocal  Psych: Normal affect   Labs    High Sensitivity Troponin:   Recent Labs  Lab 08/11/19 0608 08/11/19 0920  TROPONINIHS 23* 19*      Chemistry Recent Labs  Lab 08/11/19 0608 08/11/19 0608 08/11/19 2056 08/11/19 2130 08/12/19 0423  NA 143  --  139  --  143  K 3.1*  --  3.8  --  3.3*  CL 105  --  104  --  109  CO2 28  --  25  --  26  GLUCOSE 115*  --  151*  --  115*  BUN 19  --  13  --  14  CREATININE 1.32*   < > 1.08 1.06 0.99  CALCIUM 9.2  --  9.2  --  9.4  GFRNONAA 52*   < > >60 >60 >60  GFRAA >60   < > >60 >60 >60  ANIONGAP 10  --  10  --  8   < > = values in this interval not displayed.     Hematology Recent Labs  Lab 08/11/19 2130 08/12/19 0423 08/13/19 0932  WBC 5.8 5.5  5.8  RBC 4.51 4.35 4.88  HGB 13.9 13.5 15.2  HCT 41.1 39.9 45.3  MCV 91.1 91.7 92.8  MCH 30.8 31.0 31.1  MCHC 33.8 33.8 33.6  RDW 15.0 15.1 15.3  PLT 174 166 183    BNPNo results for input(s): BNP, PROBNP in the last 168 hours.   DDimer No results for input(s): DDIMER in the last 168 hours.   Radiology    NM Myocar Multi W/Spect W/Wall Motion / EF  Result Date: 08/12/2019  Defect 1: There is a large defect of moderate severity present in the mid anteroseptal, mid inferoseptal, mid inferior, apical anterior, apical septal, apical inferior, apical lateral and apex location.  Findings consistent with prior myocardial infarction. No significant ischemic territories.  This is a high risk study.  Nuclear stress EF: 33%.    ECHOCARDIOGRAM COMPLETE  Result Date: 08/12/2019    ECHOCARDIOGRAM REPORT   Patient Name:   Kevin Hart Date of Exam: 08/12/2019 Medical Rec #:  [id number]       Height:       70.0 in Accession #:    [id number]      Weight:       224.2 lb Date of Birth:  [birth year]       BSA:          2.191 m Patient Age:    74 years        BP:            106/75 mmHg Patient Gender: M               HR:           73 bpm. Exam Location:  Inpatient Procedure: 2D Echo, Cardiac Doppler and Color Doppler Indications:    Chest pain                 Cardiomyopathy-Ischemic  History:        Patient has prior history of Echocardiogram examinations, most                 recent 08/18/2017. Angina, Previous Myocardial Infarction and                 CAD, Defibrillator; Risk Factors:Dyslipidemia. H/o DVT. GERD.  Sonographer:    Clayton Lefort RDCS (AE) Referring Phys: [place]  1. Since the last study on 08/18/2017 LVEf has further decreased from 35-40% to 25-30% with diffuse hypokinesis and apical aneurysmal dilatation.  2. Left ventricular ejection fraction, by estimation, is 25 to 30%. The left ventricle has severely decreased function. The left ventricle has no regional wall motion abnormalities. The left ventricular internal cavity size was mildly dilated. There is moderate concentric left ventricular hypertrophy. Left ventricular diastolic parameters are consistent with Grade I diastolic dysfunction (impaired relaxation). Elevated left atrial pressure.  3. Right ventricular systolic function is normal. The right ventricular size is normal. A ICD wire is visualized. There is normal pulmonary artery systolic pressure. The estimated right ventricular systolic pressure is 0000000 mmHg.  4. The mitral valve is normal in structure. Mild mitral valve regurgitation. No evidence of mitral stenosis.  5. The aortic valve is normal in structure. Aortic valve regurgitation is mild. Mild to moderate aortic valve sclerosis/calcification is present, without any evidence of aortic stenosis.  6. The inferior vena cava is normal in size with greater than 50% respiratory variability, suggesting right atrial pressure of 3 mmHg. FINDINGS  Left Ventricle: Left ventricular ejection fraction, by estimation,  is 25 to 30%. The left ventricle has severely decreased function. The left  ventricle has no regional wall motion abnormalities. The left ventricular internal cavity size was mildly dilated. There is moderate concentric left ventricular hypertrophy. Left ventricular diastolic parameters are consistent with Grade I diastolic dysfunction (impaired relaxation). Elevated left atrial pressure.  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 Right Ventricle: The right ventricular size is normal. No increase in right ventricular wall thickness. Right ventricular systolic function is normal. There is normal pulmonary artery systolic pressure. The tricuspid regurgitant velocity is 2.39 m/s, and  with an assumed right atrial pressure of 3 mmHg, the estimated right ventricular systolic pressure is 0000000 mmHg. Left Atrium: Left atrial size was normal in size. Right Atrium: Right atrial size was normal in size. Pericardium: There is no evidence of pericardial effusion. Mitral Valve: The mitral valve is normal in structure. Normal mobility of the mitral valve leaflets. Mild mitral valve regurgitation. No evidence of mitral valve stenosis. MV peak gradient, 5.1 mmHg. The mean mitral valve gradient is 2.0 mmHg. Tricuspid Valve: The tricuspid valve is normal in structure. Tricuspid valve regurgitation is mild . No evidence of tricuspid stenosis. Aortic Valve: The aortic valve is normal in structure. Aortic valve regurgitation is mild. Aortic regurgitation PHT measures 515 msec. Mild to moderate aortic valve sclerosis/calcification is present, without any evidence of aortic stenosis. Aortic valve  mean gradient measures 3.0 mmHg. Aortic valve peak gradient measures 5.6 mmHg. Aortic valve area, by VTI measures 2.66 cm. Pulmonic Valve: The pulmonic valve was normal in structure. Pulmonic valve regurgitation is not visualized. No evidence of pulmonic stenosis. Aorta: The aortic root is normal in size and structure. Venous: The  inferior vena cava is normal in size with greater than 50% respiratory variability, suggesting right atrial pressure of 3 mmHg. IAS/Shunts: No atrial level shunt detected by color flow Doppler. Additional Comments: A ICD wire is visualized.  LEFT VENTRICLE PLAX 2D LVIDd:         4.60 cm LVIDs:         3.80 cm LV PW:         1.20 cm LV IVS:        1.40 cm LVOT diam:     2.20 cm LV SV:         66 LV SV Index:   30 LVOT Area:     3.80 cm  RIGHT VENTRICLE             IVC RV Basal diam:  3.00 cm     IVC diam: 1.60 cm RV S prime:     11.20 cm/s TAPSE (M-mode): 1.6 cm LEFT ATRIUM             Index       RIGHT ATRIUM           Index LA diam:        3.00 cm 1.37 cm/m  RA Area:     16.90 cm LA Vol (A2C):   55.8 ml 25.47 ml/m RA Volume:   41.60 ml  18.99 ml/m LA Vol (A4C):   54.8 ml 25.01 ml/m LA Biplane Vol: 57.8 ml 26.38 ml/m  AORTIC VALVE AV Area (Vmax):    2.68 cm AV Area (Vmean):   2.79 cm AV Area (VTI):     2.66 cm AV Vmax:           118.00 cm/s AV Vmean:  82.500 cm/s AV VTI:            0.248 m AV Peak Grad:      5.6 mmHg AV Mean Grad:      3.0 mmHg LVOT Vmax:         83.34 cm/s LVOT Vmean:        60.460 cm/s LVOT VTI:          0.174 m LVOT/AV VTI ratio: 0.70 AI PHT:            515 msec  AORTA Ao Root diam: 3.90 cm MITRAL VALVE               TRICUSPID VALVE MV Area (PHT): 3.27 cm    TR Peak grad:   22.8 mmHg MV Peak grad:  5.1 mmHg    TR Vmax:        239.00 cm/s MV Mean grad:  2.0 mmHg MV Vmax:       1.13 m/s    SHUNTS MV Vmean:      57.0 cm/s   Systemic VTI:  0.17 m MV Decel Time: 232 msec    Systemic Diam: 2.20 cm MV E velocity: 63.10 cm/s MV A velocity: 86.80 cm/s MV E/A ratio:  0.73 Ena Dawley MD Electronically signed by Ena Dawley MD Signature Date/Time: 08/12/2019/12:36:33 PM    Final     Cardiac Studies   See above under radiology for echo and NST  Cath5/10/2018  Prox LAD to Mid LAD lesion is 30% stenosed.  LV end diastolic pressure is normal.  1. Mild in-stent  restenosis in the LAD 2. Widely patent dominant LCx 3. Patent, nondominant RCA 4. Normal LVEDP  Suspect noncardiac chest pain. Resume warfarin today. Continue medical therapy for secondary risk reduction. OK for discharge home today from cardiac perspective.  Patient Profile     74 y.o. 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 He underwent DES to LAD for in-stent restenosis in April 2016, EF was 25 to 30% the time. Repeat echocardiogram in May 2017 showed EF 30 to 35%. He underwent ICD implantation by Dr. Rayann Heman in September 2016. He was later diagnosed with obstructive sleep apnea at [facility]. Last Myoview obtained on 10/14/2016 showed EF 34%, no EKG changes, large defect of moderate severity present in the basal inferior, mid anterior, mid inferoseptal, mid inferior, apical anterior, apical septal and apical inferior location consistent with previous scar however very mild peri-infarct ischemia. Medication titration after multiple PCI has been difficult to given orthostasis and low blood pressure. He has been referred to to the lipid clinic for consideration of PCSK9 inhibitor and Entresto. He was eventually started on Entresto in April 2019. Repeat echocardiogram obtained on 08/18/2017 showed EF 35 to 40%, mild LVH, wall motion abnormality, mild AI, trivial MR.  Last cath 07/27/2018 with non obstructive disease.     Assessment & Plan    1.  Chest pain/coronary artery disease: There was some thought on admission that chest pain may represent unstable angina.  INR still greater than 2 and pharmacy will start IV heparin once INR is less than 2.  He is on low-dose IV nitroglycerin.  Symptoms are similar to prior cardiac pain.  Last cardiac catheterization in May 2020 demonstrated mild in-stent restenosis in the LAD.  There was  otherwise nonobstructive coronary artery disease.   On  nuclear stress test demonstrated scar and no ischemic territories, LVEF has declined by echocardiogram from 35 to 40% previously to 25 to 30%.  I will arrange for coronary angiography for 08/14/2019.  Both the patient and his wife are in agreement. Risks and benefits of cardiac catheterization have been discussed with the patient.  These include bleeding, infection, kidney damage, stroke, heart attack, death.  The patient understands these risks and is willing to proceed.  2.  Chronic systolic heart failure: LVEF 35 to 40% by most recent echocardiogram but now down to 25 to 30% by echocardiogram performed yesterday.  Currently on Entresto 24-26 mg twice daily and Lasix 40 mg daily along with Inderal 20 mg twice daily. I will arrange for cardiac catheterization for 08/14/2019.  3.  ICD: Stable without shocks.  4.  Obstructive sleep apnea: Uses CPAP.  5.  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  6.  Hypokalemia: I have started supplemental potassium chloride daily.  I will obtain a follow-up basic metabolic panel.   For questions or updates, please contact [place] Please consult [email or web address] for contact info under        Signed, Kate Sable, MD  08/13/2019, 10:57 AM

## 2019-08-13 NOTE — Progress Notes (Signed)
ANTICOAGULATION CONSULT NOTE  Pharmacy Consult for heparin Indication: chest pain/ACS and history of multiple DVT, positive lupus anticoagulant  Heparin Dosing Weight: 94.4 kg  Labs: Recent Labs    08/11/19 0608 08/11/19 0608 08/11/19 2056 08/11/19 2130 08/12/19 0423 08/13/19 0354  HGB 14.3   < >  --  13.9 13.5  --   HCT 41.9  --   --  41.1 39.9  --   PLT 180  --   --  174 166  --   LABPROT 25.4*  --   --   --  27.2* 25.0*  INR 2.4*  --   --   --  2.6* 2.4*  CREATININE 1.32*   < > 1.08 1.06 0.99  --    < > = values in this interval not displayed.    Assessment: 74 year old male with history of multiple DVT, positive lupus anticoagulation on warfarin prior to admission, presented with chest pain 5/21. Pharmacy consulted to dose heparin for ACS when INR<2.  Home warfarin regimen: 2.5 mg daily  Last dose of warfarin 08/10/19. INR slowly trending down, now 2.4. Noted history of SAH in 2015 but no current active bleed issues reported.  Goal of Therapy:  Heparin level 0.3-0.7 units/ml Monitor platelets by anticoagulation protocol: Yes   Plan:  - Continue to hold prior to admission warfarin - Start heparin when INR<2 - Monitor daily INR, CBC, s/sx bleeding     Kallen Delatorre L. Devin Going, [place] PGY1 Pharmacy Resident [phone] 08/13/19     8:55 AM  Please check AMION for all [place] phone numbers After 10:00 PM, call the [facility] [phone]

## 2019-08-14 ENCOUNTER — Encounter (HOSPITAL_COMMUNITY)
Admission: EM | Disposition: A | Discharge status: Home / Self Care | Payer: Self-pay | Source: Home / Self Care | Attending: Cardiology

## 2019-08-14 DIAGNOSIS — I25119 Atherosclerotic heart disease of native coronary artery with unspecified angina pectoris: Secondary | ICD-10-CM

## 2019-08-14 DIAGNOSIS — I82402 Acute embolism and thrombosis of unspecified deep veins of left lower extremity: Secondary | ICD-10-CM

## 2019-08-14 HISTORY — PX: LEFT HEART CATH AND CORONARY ANGIOGRAPHY: CATH118249

## 2019-08-14 LAB — CBC
HCT: 42.8 % (ref 39.0–52.0)
Hemoglobin: 14.1 g/dL (ref 13.0–17.0)
MCH: 31.2 pg (ref 26.0–34.0)
MCHC: 32.9 g/dL (ref 30.0–36.0)
MCV: 94.7 fL (ref 80.0–100.0)
Platelets: 179 10*3/uL (ref 150–400)
RBC: 4.52 MIL/uL (ref 4.22–5.81)
RDW: 15.3 % (ref 11.5–15.5)
WBC: 5.9 10*3/uL (ref 4.0–10.5)
nRBC: 0 % (ref 0.0–0.2)

## 2019-08-14 LAB — BASIC METABOLIC PANEL
Anion gap: 10 (ref 5–15)
BUN: 14 mg/dL (ref 8–23)
CO2: 25 mmol/L (ref 22–32)
Calcium: 9.5 mg/dL (ref 8.9–10.3)
Chloride: 105 mmol/L (ref 98–111)
Creatinine, Ser: 1.04 mg/dL (ref 0.61–1.24)
GFR calc Af Amer: >60 mL/min (ref >60)
GFR calc non Af Amer: >60 mL/min (ref >60)
Glucose, Bld: 105 mg/dL — ABNORMAL HIGH (ref 70–99)
Potassium: 4.3 mmol/L (ref 3.5–5.1)
Sodium: 140 mmol/L (ref 135–145)

## 2019-08-14 LAB — PROTIME-INR
INR: 2 — ABNORMAL HIGH (ref 0.8–1.2)
Prothrombin Time: 21.6 seconds — ABNORMAL HIGH (ref 11.4–15.2)

## 2019-08-14 SURGERY — LEFT HEART CATH AND CORONARY ANGIOGRAPHY
Anesthesia: LOCAL

## 2019-08-14 MED ORDER — IOHEXOL 350 MG/ML SOLN
INTRAVENOUS | Status: DC | PRN
  Administered 2019-08-14: 55 mL

## 2019-08-14 MED ORDER — ACETAMINOPHEN 325 MG PO TABS: 650.0000 mg | ORAL_TABLET | ORAL | Status: DC | PRN

## 2019-08-14 MED ORDER — HEPARIN SODIUM (PORCINE) 1000 UNIT/ML IJ SOLN
INTRAMUSCULAR | Status: AC
  Filled 2019-08-14: qty 1

## 2019-08-14 MED ORDER — LABETALOL HCL 5 MG/ML IV SOLN: 10.0000 mg | INTRAVENOUS | Status: AC | PRN

## 2019-08-14 MED ORDER — SODIUM CHLORIDE 0.9 % IV SOLN: 250.0000 mL | INTRAVENOUS | Status: DC | PRN

## 2019-08-14 MED ORDER — ASPIRIN 81 MG PO CHEW
81.0000 mg | CHEWABLE_TABLET | Freq: Every day | ORAL | Status: DC
  Administered 2019-08-15: 81 mg via ORAL
  Filled 2019-08-14: qty 1

## 2019-08-14 MED ORDER — SODIUM CHLORIDE 0.9 % IV SOLN: INTRAVENOUS | Status: DC

## 2019-08-14 MED ORDER — FENTANYL CITRATE (PF) 100 MCG/2ML IJ SOLN
INTRAMUSCULAR | Status: AC
  Filled 2019-08-14: qty 2

## 2019-08-14 MED ORDER — HEPARIN (PORCINE) IN NACL 1000-0.9 UT/500ML-% IV SOLN
INTRAVENOUS | Status: DC | PRN
  Administered 2019-08-14: 500 mL

## 2019-08-14 MED ORDER — DIAZEPAM 5 MG PO TABS: 5.0000 mg | ORAL_TABLET | Freq: Four times a day (QID) | ORAL | Status: DC | PRN

## 2019-08-14 MED ORDER — HEPARIN (PORCINE) 25000 UT/250ML-% IV SOLN: 1150.0000 [IU]/h | INTRAVENOUS | Status: DC

## 2019-08-14 MED ORDER — VERAPAMIL HCL 2.5 MG/ML IV SOLN
INTRAVENOUS | Status: AC
  Filled 2019-08-14: qty 2

## 2019-08-14 MED ORDER — NITROGLYCERIN 1 MG/10 ML FOR IR/CATH LAB
INTRA_ARTERIAL | Status: AC
  Filled 2019-08-14: qty 10

## 2019-08-14 MED ORDER — VERAPAMIL HCL 2.5 MG/ML IV SOLN
INTRAVENOUS | Status: DC | PRN
  Administered 2019-08-14: 10 mL via INTRA_ARTERIAL

## 2019-08-14 MED ORDER — HEPARIN SODIUM (PORCINE) 1000 UNIT/ML IJ SOLN
INTRAMUSCULAR | Status: DC | PRN
  Administered 2019-08-14: 5000 [IU] via INTRAVENOUS

## 2019-08-14 MED ORDER — LIDOCAINE HCL (PF) 1 % IJ SOLN
INTRAMUSCULAR | Status: AC
  Filled 2019-08-14: qty 30

## 2019-08-14 MED ORDER — LIDOCAINE HCL (PF) 1 % IJ SOLN
INTRAMUSCULAR | Status: DC | PRN
  Administered 2019-08-14: 2 mL

## 2019-08-14 MED ORDER — ONDANSETRON HCL 4 MG/2ML IJ SOLN: 4.0000 mg | Freq: Four times a day (QID) | INTRAMUSCULAR | Status: DC | PRN

## 2019-08-14 MED ORDER — SODIUM CHLORIDE 0.9% FLUSH: 3.0000 mL | Freq: Two times a day (BID) | INTRAVENOUS | Status: DC

## 2019-08-14 MED ORDER — NITROGLYCERIN 1 MG/10 ML FOR IR/CATH LAB
INTRA_ARTERIAL | Status: DC | PRN
  Administered 2019-08-14: 200 ug

## 2019-08-14 MED ORDER — HYDRALAZINE HCL 20 MG/ML IJ SOLN: 10.0000 mg | INTRAMUSCULAR | Status: AC | PRN

## 2019-08-14 MED ORDER — WARFARIN SODIUM 4 MG PO TABS
4.0000 mg | ORAL_TABLET | Freq: Once | ORAL | Status: AC
  Administered 2019-08-14: 4 mg via ORAL
  Filled 2019-08-14: qty 1

## 2019-08-14 MED ORDER — FENTANYL CITRATE (PF) 100 MCG/2ML IJ SOLN
INTRAMUSCULAR | Status: DC | PRN
  Administered 2019-08-14: 25 ug via INTRAVENOUS

## 2019-08-14 MED ORDER — SODIUM CHLORIDE 0.9% FLUSH: 3.0000 mL | INTRAVENOUS | Status: DC | PRN

## 2019-08-14 MED ORDER — HEPARIN (PORCINE) IN NACL 1000-0.9 UT/500ML-% IV SOLN
INTRAVENOUS | Status: AC
  Filled 2019-08-14: qty 1000

## 2019-08-14 MED ORDER — WARFARIN - PHARMACIST DOSING INPATIENT: Freq: Every day | Status: DC

## 2019-08-14 MED ORDER — ASPIRIN 81 MG PO CHEW: 81.0000 mg | CHEWABLE_TABLET | ORAL | Status: DC

## 2019-08-14 MED ORDER — MIDAZOLAM HCL 2 MG/2ML IJ SOLN
INTRAMUSCULAR | Status: DC | PRN
  Administered 2019-08-14: 1 mg via INTRAVENOUS

## 2019-08-14 MED ORDER — MIDAZOLAM HCL 2 MG/2ML IJ SOLN
INTRAMUSCULAR | Status: AC
  Filled 2019-08-14: qty 2

## 2019-08-14 SURGICAL SUPPLY — 12 items
CATH INFINITI JR4 5F (CATHETERS) ×1 IMPLANT
CATH OPTITORQUE TIG 4.0 5F (CATHETERS) ×1 IMPLANT
DEVICE RAD COMP TR BAND LRG (VASCULAR PRODUCTS) ×1 IMPLANT
GLIDESHEATH SLEND SS 6F .021 (SHEATH) ×1 IMPLANT
GUIDEWIRE INQWIRE 1.5J.035X260 (WIRE) IMPLANT
INQWIRE 1.5J .035X260CM (WIRE) ×2
KIT HEART LEFT (KITS) ×2 IMPLANT
PACK CARDIAC CATHETERIZATION (CUSTOM PROCEDURE TRAY) ×2 IMPLANT
SHEATH PROBE COVER 6X72 (BAG) ×1 IMPLANT
TRANSDUCER W/STOPCOCK (MISCELLANEOUS) ×2 IMPLANT
TUBING CIL FLEX 10 FLL-RA (TUBING) ×2 IMPLANT
WIRE HI TORQ VERSACORE-J 145CM (WIRE) ×1 IMPLANT

## 2019-08-14 NOTE — Progress Notes (Addendum)
Progress Note  Patient Name: Kevin Hart Date of Encounter: 08/14/2019  Primary Cardiologist: Peter Martinique, MD   Subjective   Plan for cath. INR 2.0 this morning. No chest pain or dyspnea.   Inpatient Medications    Scheduled Meds: . allopurinol  300 mg Oral Daily  . aspirin EC  81 mg Oral Daily  . furosemide  40 mg Oral Daily  . pantoprazole  40 mg Oral Daily  . potassium chloride  20 mEq Oral Daily  . propranolol  20 mg Oral BID  . rosuvastatin  20 mg Oral Daily  . sacubitril-valsartan  1 tablet Oral BID   Continuous Infusions: . heparin    . nitroGLYCERIN 5 mcg/min (08/11/19 0636)   PRN Meds: acetaminophen, ALPRAZolam, fentaNYL (SUBLIMAZE) injection, nitroGLYCERIN, ondansetron (ZOFRAN) IV   Vital Signs    Vitals:   08/13/19 0939 08/13/19 1300 08/13/19 1939 08/14/19 0358  BP: 109/70 93/62 101/64 115/77  Pulse: 80 77 70 71  Resp:   14 17  Temp:  98.7 F (37.1 C) 98.4 F (36.9 C) 98.4 F (36.9 C)  TempSrc:  Oral Oral Oral  SpO2:  95% 96% 93%  Weight:    99.6 kg  Height:        Intake/Output Summary (Last 24 hours) at 08/14/2019 0750 Last data filed at 08/13/2019 2000 Gross per 24 hour  Intake -  Output 850 ml  Net -850 ml   Last 3 Weights 08/14/2019 08/13/2019 08/12/2019  Weight (lbs) 219 lb 8 oz 220 lb 14.4 oz (No Data)  Weight (kg) 99.565 kg 100.2 kg (No Data)      Telemetry    NSR with PACs, PVCs - Personally Reviewed  ECG    No new tracing - Personally Reviewed  Physical Exam   GEN: No acute distress.   Neck: No JVD Cardiac: RRR, no murmurs, rubs, or gallops.  Respiratory: Clear to auscultation bilaterally. GI: Soft, nontender, non-distended  MS: No edema; No deformity. Neuro:  Nonfocal  Psych: Normal affect   Labs    High Sensitivity Troponin:   Recent Labs  Lab 08/11/19 0608 08/11/19 0920  TROPONINIHS 23* 19*      Chemistry Recent Labs  Lab 08/11/19 0608 08/11/19 0608 08/11/19 2056 08/11/19 2130 08/12/19 0423  NA  143  --  139  --  143  K 3.1*  --  3.8  --  3.3*  CL 105  --  104  --  109  CO2 28  --  25  --  26  GLUCOSE 115*  --  151*  --  115*  BUN 19  --  13  --  14  CREATININE 1.32*   < > 1.08 1.06 0.99  CALCIUM 9.2  --  9.2  --  9.4  GFRNONAA 52*   < > >60 >60 >60  GFRAA >60   < > >60 >60 >60  ANIONGAP 10  --  10  --  8   < > = values in this interval not displayed.     Hematology Recent Labs  Lab 08/12/19 0423 08/13/19 0932 08/14/19 0346  WBC 5.5 5.8 5.9  RBC 4.35 4.88 4.52  HGB 13.5 15.2 14.1  HCT 39.9 45.3 42.8  MCV 91.7 92.8 94.7  MCH 31.0 31.1 31.2  MCHC 33.8 33.6 32.9  RDW 15.1 15.3 15.3  PLT 166 183 179    Radiology    NM Myocar Multi W/Spect W/Wall Motion / EF  Result Date: 08/12/2019  Defect 1: There is a large defect of moderate severity present in the mid anteroseptal, mid inferoseptal, mid inferior, apical anterior, apical septal, apical inferior, apical lateral and apex location.  Findings consistent with prior myocardial infarction. No significant ischemic territories.  This is a high risk study.  Nuclear stress EF: 33%.    ECHOCARDIOGRAM COMPLETE  Result Date: 08/12/2019    ECHOCARDIOGRAM REPORT   Patient Name:   Kevin Hart Date of Exam: 08/12/2019 Medical Rec #:  [id number]       Height:       70.0 in Accession #:    [id number]      Weight:       224.2 lb Date of Birth:  06-Feb-1946       BSA:          2.191 m Patient Age:    74 years        BP:           106/75 mmHg Patient Gender: M               HR:           73 bpm. Exam Location:  Inpatient Procedure: 2D Echo, Cardiac Doppler and Color Doppler Indications:    Chest pain                 Cardiomyopathy-Ischemic  History:        Patient has prior history of Echocardiogram examinations, most                 recent 08/18/2017. Angina, Previous Myocardial Infarction and                 CAD, Defibrillator; Risk Factors:Dyslipidemia. H/o DVT. GERD.  Sonographer:    Clayton Lefort RDCS (AE) Referring Phys: Ider  1. Since the last study on 08/18/2017 LVEf has further decreased from 35-40% to 25-30% with diffuse hypokinesis and apical aneurysmal dilatation.  2. Left ventricular ejection fraction, by estimation, is 25 to 30%. The left ventricle has severely decreased function. The left ventricle has no regional wall motion abnormalities. The left ventricular internal cavity size was mildly dilated. There is moderate concentric left ventricular hypertrophy. Left ventricular diastolic parameters are consistent with Grade I diastolic dysfunction (impaired relaxation). Elevated left atrial pressure.  3. Right ventricular systolic function is normal. The right ventricular size is normal. A ICD wire is visualized. There is normal pulmonary artery systolic pressure. The estimated right ventricular systolic pressure is 0000000 mmHg.  4. The mitral valve is normal in structure. Mild mitral valve regurgitation. No evidence of mitral stenosis.  5. The aortic valve is normal in structure. Aortic valve regurgitation is mild. Mild to moderate aortic valve sclerosis/calcification is present, without any evidence of aortic stenosis.  6. The inferior vena cava is normal in size with greater than 50% respiratory variability, suggesting right atrial pressure of 3 mmHg. FINDINGS  Left Ventricle: Left ventricular ejection fraction, by estimation, is 25 to 30%. The left ventricle has severely decreased function. The left ventricle has no regional wall motion abnormalities. The left ventricular internal cavity size was mildly dilated. There is moderate concentric left ventricular hypertrophy. Left ventricular diastolic parameters are consistent with Grade I diastolic dysfunction (impaired relaxation). Elevated left atrial pressure.  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 Right Ventricle: The right ventricular size is normal. No increase in right  ventricular  wall thickness. Right ventricular systolic function is normal. There is normal pulmonary artery systolic pressure. The tricuspid regurgitant velocity is 2.39 m/s, and  with an assumed right atrial pressure of 3 mmHg, the estimated right ventricular systolic pressure is 0000000 mmHg. Left Atrium: Left atrial size was normal in size. Right Atrium: Right atrial size was normal in size. Pericardium: There is no evidence of pericardial effusion. Mitral Valve: The mitral valve is normal in structure. Normal mobility of the mitral valve leaflets. Mild mitral valve regurgitation. No evidence of mitral valve stenosis. MV peak gradient, 5.1 mmHg. The mean mitral valve gradient is 2.0 mmHg. Tricuspid Valve: The tricuspid valve is normal in structure. Tricuspid valve regurgitation is mild . No evidence of tricuspid stenosis. Aortic Valve: The aortic valve is normal in structure. Aortic valve regurgitation is mild. Aortic regurgitation PHT measures 515 msec. Mild to moderate aortic valve sclerosis/calcification is present, without any evidence of aortic stenosis. Aortic valve  mean gradient measures 3.0 mmHg. Aortic valve peak gradient measures 5.6 mmHg. Aortic valve area, by VTI measures 2.66 cm. Pulmonic Valve: The pulmonic valve was normal in structure. Pulmonic valve regurgitation is not visualized. No evidence of pulmonic stenosis. Aorta: The aortic root is normal in size and structure. Venous: The inferior vena cava is normal in size with greater than 50% respiratory variability, suggesting right atrial pressure of 3 mmHg. IAS/Shunts: No atrial level shunt detected by color flow Doppler. Additional Comments: A ICD wire is visualized.  LEFT VENTRICLE PLAX 2D LVIDd:         4.60 cm LVIDs:         3.80 cm LV PW:         1.20 cm LV IVS:        1.40 cm LVOT diam:     2.20 cm LV SV:         66 LV SV Index:   30 LVOT Area:     3.80 cm  RIGHT VENTRICLE             IVC RV Basal diam:  3.00 cm     IVC diam: 1.60 cm RV S  prime:     11.20 cm/s TAPSE (M-mode): 1.6 cm LEFT ATRIUM             Index       RIGHT ATRIUM           Index LA diam:        3.00 cm 1.37 cm/m  RA Area:     16.90 cm LA Vol (A2C):   55.8 ml 25.47 ml/m RA Volume:   41.60 ml  18.99 ml/m LA Vol (A4C):   54.8 ml 25.01 ml/m LA Biplane Vol: 57.8 ml 26.38 ml/m  AORTIC VALVE AV Area (Vmax):    2.68 cm AV Area (Vmean):   2.79 cm AV Area (VTI):     2.66 cm AV Vmax:           118.00 cm/s AV Vmean:          82.500 cm/s AV VTI:            0.248 m AV Peak Grad:      5.6 mmHg AV Mean Grad:      3.0 mmHg LVOT Vmax:         83.34 cm/s LVOT Vmean:        60.460 cm/s LVOT VTI:          0.174 m LVOT/AV VTI ratio: 0.70 AI  PHT:            515 msec  AORTA Ao Root diam: 3.90 cm MITRAL VALVE               TRICUSPID VALVE MV Area (PHT): 3.27 cm    TR Peak grad:   22.8 mmHg MV Peak grad:  5.1 mmHg    TR Vmax:        239.00 cm/s MV Mean grad:  2.0 mmHg MV Vmax:       1.13 m/s    SHUNTS MV Vmean:      57.0 cm/s   Systemic VTI:  0.17 m MV Decel Time: 232 msec    Systemic Diam: 2.20 cm MV E velocity: 63.10 cm/s MV A velocity: 86.80 cm/s MV E/A ratio:  0.73 Ena Dawley MD Electronically signed by Ena Dawley MD Signature Date/Time: 08/12/2019/12:36:33 PM    Final     Cardiac Studies   Echo 08/12/19 1. Since the last study on 08/18/2017 LVEf has further decreased from  35-40% to 25-30% with diffuse hypokinesis and apical aneurysmal  dilatation.  2. Left ventricular ejection fraction, by estimation, is 25 to 30%. The  left ventricle has severely decreased function. The left ventricle has no  regional wall motion abnormalities. The left ventricular internal cavity  size was mildly dilated. There is  moderate concentric left ventricular hypertrophy. Left ventricular  diastolic parameters are consistent with Grade I diastolic dysfunction  (impaired relaxation). Elevated left atrial pressure.  3. Right ventricular systolic function is normal. The right ventricular   size is normal. A ICD wire is visualized. There is normal pulmonary artery  systolic pressure. The estimated right ventricular systolic pressure is  0000000 mmHg.  4. The mitral valve is normal in structure. Mild mitral valve  regurgitation. No evidence of mitral stenosis.  5. The aortic valve is normal in structure. Aortic valve regurgitation is  mild. Mild to moderate aortic valve sclerosis/calcification is present,  without any evidence of aortic stenosis.  6. The inferior vena cava is normal in size with greater than 50%  respiratory variability, suggesting right atrial pressure of 3 mmHg.   Stress test 08/12/19  Defect 1: There is a large defect of moderate severity present in the mid anteroseptal, mid inferoseptal, mid inferior, apical anterior, apical septal, apical inferior, apical lateral and apex location.  Findings consistent with prior myocardial infarction. No significant ischemic territories.  This is a high risk study.  Nuclear stress EF: 33%.  Patient Profile     74 y.o. 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   Medication titration after multiple PCI has been difficult to given orthostasis and low blood pressure.   Assessment & Plan    1. Unstable angina with hx of CAD s/p Multiple PCIs  (Last cath 07/27/2018 with non obstructive disease) - Chest pain resolved on nitro. Hs-troponin 23>>13. Stress test was high risk with findings consistent with prior myocardial infarction. No significant ischemic territories. Plan for cardiac catheterization given reduced LVEF to 25-30% from 35-40%.  - INR 2.0 today. Was 2.4 yesterday. Will keep NPO for possible cath with INR check in afternoon.  - The patient understands that risks include but are not limited to stroke (1 in 1000),  death (1 in 1000), kidney failure [usually temporary] (1 in 500),  bleeding (1 in 200), allergic reaction [possibly serious] (1 in 200), and agrees to proceed.   2. Chronic systolic CHF - Echo this admission showed LVEF decreased from 35-40% to 25-30% with diffuse hypokinesis and apical aneurysmal dilatation.  - Continue Entresto 24/26mg  BID and Inderal 20mg  daily. Hx of low blood pressure and orthostasis.  - Apprears euvolemic  3. Recurrent DVT/ Lupus anticoagulant positive  - On Warfarin chronically - INR 2.0 today. Pharmacy to start heparin and hold coumadin  4. HLD - Hx of statin intolerance  - 08/12/2019: Cholesterol 169; HDL 39; LDL Cholesterol 106; Triglycerides 118; VLDL 24  - Continue Crestor 20mg  qd   For questions or updates, please contact [place] Please consult [email or web address] for contact info under        SignedLeanor Kail, PA  08/14/2019, 7:50 AM

## 2019-08-14 NOTE — Progress Notes (Addendum)
ANTICOAGULATION CONSULT NOTE  Pharmacy Consult for heparin Indication: chest pain/ACS and history of multiple DVT, positive lupus anticoagulant  Heparin Dosing Weight: 94.4 kg  Labs: Recent Labs    08/11/19 2056 08/11/19 2130 08/11/19 2130 08/12/19 0423 08/12/19 0423 08/13/19 0354 08/13/19 0932 08/14/19 0346  HGB  --  13.9   < > 13.5   < >  --  15.2 14.1  HCT  --  41.1   < > 39.9  --   --  45.3 42.8  PLT  --  174   < > 166  --   --  183 179  LABPROT  --   --   --  27.2*  --  25.0*  --  21.6*  INR  --   --   --  2.6*  --  2.4*  --  2.0*  CREATININE 1.08 1.06  --  0.99  --   --   --   --    < > = values in this interval not displayed.    Assessment: 74 year old male with history of multiple DVT, positive lupus anticoagulation on warfarin prior to admission, presented with chest pain 5/21. Pharmacy consulted to dose heparin for ACS when INR<2. -INR= 2.0  Home warfarin regimen: 2.5 mg daily    Goal of Therapy:  Heparin level 0.3-0.7 units/ml Monitor platelets by anticoagulation protocol: Yes   Plan:  -Check INR later today for possible cath today -Will start heparin later today if no plans for cath on 5/24 - Monitor daily INR  Hildred Laser, PharmD Clinical Pharmacist **Pharmacist phone directory can now be found on [email or web address] ([email or web address]).  Listed under [place].  Addendum -plans are for cath today -Will follow post cath and consider starting heparin   Hildred Laser, PharmD Clinical Pharmacist **Pharmacist phone directory can now be found on [email or web address] ([email or web address]).  Listed under [place].

## 2019-08-14 NOTE — Interval H&P Note (Signed)
Cath Lab Visit (complete for each Cath Lab visit)  Clinical Evaluation Leading to the Procedure:   ACS: No.  Non-ACS:    Anginal Classification: CCS II  Anti-ischemic medical therapy: Minimal Therapy (1 class of medications)  Non-Invasive Test Results: No non-invasive testing performed  Prior CABG: Previous CABG      History and Physical Interval Note:  08/14/2019 3:33 PM  Kevin Hart  has presented today for surgery, with the diagnosis of unstable angina.  The various methods of treatment have been discussed with the patient and family. After consideration of risks, benefits and other options for treatment, the patient has consented to  Procedure(s): LEFT HEART CATH AND CORONARY ANGIOGRAPHY ([name]) as a surgical intervention.  The patient's history has been reviewed, patient examined, no change in status, stable for surgery.  I have reviewed the patient's chart and labs.  Questions were answered to the patient's satisfaction.     Shelva Majestic

## 2019-08-14 NOTE — H&P (View-Only) (Signed)
Progress Note  Patient Name: Kevin Hart Date of Encounter: 08/14/2019  Primary Cardiologist: Peter Martinique, MD   Subjective   Plan for cath. INR 2.0 this morning. No chest pain or dyspnea.   Inpatient Medications    Scheduled Meds: . allopurinol  300 mg Oral Daily  . aspirin EC  81 mg Oral Daily  . furosemide  40 mg Oral Daily  . pantoprazole  40 mg Oral Daily  . potassium chloride  20 mEq Oral Daily  . propranolol  20 mg Oral BID  . rosuvastatin  20 mg Oral Daily  . sacubitril-valsartan  1 tablet Oral BID   Continuous Infusions: . heparin    . nitroGLYCERIN 5 mcg/min (08/11/19 0636)   PRN Meds: acetaminophen, ALPRAZolam, fentaNYL (SUBLIMAZE) injection, nitroGLYCERIN, ondansetron (ZOFRAN) IV   Vital Signs    Vitals:   08/13/19 0939 08/13/19 1300 08/13/19 1939 08/14/19 0358  BP: 109/70 93/62 101/64 115/77  Pulse: 80 77 70 71  Resp:   14 17  Temp:  98.7 F (37.1 C) 98.4 F (36.9 C) 98.4 F (36.9 C)  TempSrc:  Oral Oral Oral  SpO2:  95% 96% 93%  Weight:    99.6 kg  Height:        Intake/Output Summary (Last 24 hours) at 08/14/2019 0750 Last data filed at 08/13/2019 2000 Gross per 24 hour  Intake -  Output 850 ml  Net -850 ml   Last 3 Weights 08/14/2019 08/13/2019 08/12/2019  Weight (lbs) 219 lb 8 oz 220 lb 14.4 oz (No Data)  Weight (kg) 99.565 kg 100.2 kg (No Data)      Telemetry    NSR with PACs, PVCs - Personally Reviewed  ECG    No new tracing - Personally Reviewed  Physical Exam   GEN: No acute distress.   Neck: No JVD Cardiac: RRR, no murmurs, rubs, or gallops.  Respiratory: Clear to auscultation bilaterally. GI: Soft, nontender, non-distended  MS: No edema; No deformity. Neuro:  Nonfocal  Psych: Normal affect   Labs    High Sensitivity Troponin:   Recent Labs  Lab 08/11/19 0608 08/11/19 0920  TROPONINIHS 23* 19*      Chemistry Recent Labs  Lab 08/11/19 0608 08/11/19 0608 08/11/19 2056 08/11/19 2130 08/12/19 0423  NA  143  --  139  --  143  K 3.1*  --  3.8  --  3.3*  CL 105  --  104  --  109  CO2 28  --  25  --  26  GLUCOSE 115*  --  151*  --  115*  BUN 19  --  13  --  14  CREATININE 1.32*   < > 1.08 1.06 0.99  CALCIUM 9.2  --  9.2  --  9.4  GFRNONAA 52*   < > >60 >60 >60  GFRAA >60   < > >60 >60 >60  ANIONGAP 10  --  10  --  8   < > = values in this interval not displayed.     Hematology Recent Labs  Lab 08/12/19 0423 08/13/19 0932 08/14/19 0346  WBC 5.5 5.8 5.9  RBC 4.35 4.88 4.52  HGB 13.5 15.2 14.1  HCT 39.9 45.3 42.8  MCV 91.7 92.8 94.7  MCH 31.0 31.1 31.2  MCHC 33.8 33.6 32.9  RDW 15.1 15.3 15.3  PLT 166 183 179    Radiology    NM Myocar Multi W/Spect W/Wall Motion / EF  Result Date: 08/12/2019  Defect 1: There is a large defect of moderate severity present in the mid anteroseptal, mid inferoseptal, mid inferior, apical anterior, apical septal, apical inferior, apical lateral and apex location.  Findings consistent with prior myocardial infarction. No significant ischemic territories.  This is a high risk study.  Nuclear stress EF: 33%.    ECHOCARDIOGRAM COMPLETE  Result Date: 08/12/2019    ECHOCARDIOGRAM REPORT   Patient Name:   ZEKI GEIS Date of Exam: 08/12/2019 Medical Rec #:  [id number]       Height:       70.0 in Accession #:    [id number]      Weight:       224.2 lb Date of Birth:  08-03-1945       BSA:          2.191 m Patient Age:    74 years        BP:           106/75 mmHg Patient Gender: M               HR:           73 bpm. Exam Location:  Inpatient Procedure: 2D Echo, Cardiac Doppler and Color Doppler Indications:    Chest pain                 Cardiomyopathy-Ischemic  History:        Patient has prior history of Echocardiogram examinations, most                 recent 08/18/2017. Angina, Previous Myocardial Infarction and                 CAD, Defibrillator; Risk Factors:Dyslipidemia. H/o DVT. GERD.  Sonographer:    Clayton Lefort RDCS (AE) Referring Phys: [place]  1. Since the last study on 08/18/2017 LVEf has further decreased from 35-40% to 25-30% with diffuse hypokinesis and apical aneurysmal dilatation.  2. Left ventricular ejection fraction, by estimation, is 25 to 30%. The left ventricle has severely decreased function. The left ventricle has no regional wall motion abnormalities. The left ventricular internal cavity size was mildly dilated. There is moderate concentric left ventricular hypertrophy. Left ventricular diastolic parameters are consistent with Grade I diastolic dysfunction (impaired relaxation). Elevated left atrial pressure.  3. Right ventricular systolic function is normal. The right ventricular size is normal. A ICD wire is visualized. There is normal pulmonary artery systolic pressure. The estimated right ventricular systolic pressure is 0000000 mmHg.  4. The mitral valve is normal in structure. Mild mitral valve regurgitation. No evidence of mitral stenosis.  5. The aortic valve is normal in structure. Aortic valve regurgitation is mild. Mild to moderate aortic valve sclerosis/calcification is present, without any evidence of aortic stenosis.  6. The inferior vena cava is normal in size with greater than 50% respiratory variability, suggesting right atrial pressure of 3 mmHg. FINDINGS  Left Ventricle: Left ventricular ejection fraction, by estimation, is 25 to 30%. The left ventricle has severely decreased function. The left ventricle has no regional wall motion abnormalities. The left ventricular internal cavity size was mildly dilated. There is moderate concentric left ventricular hypertrophy. Left ventricular diastolic parameters are consistent with Grade I diastolic dysfunction (impaired relaxation). Elevated left atrial pressure.  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 Right Ventricle: The right ventricular size is normal. No increase in right  ventricular  wall thickness. Right ventricular systolic function is normal. There is normal pulmonary artery systolic pressure. The tricuspid regurgitant velocity is 2.39 m/s, and  with an assumed right atrial pressure of 3 mmHg, the estimated right ventricular systolic pressure is 0000000 mmHg. Left Atrium: Left atrial size was normal in size. Right Atrium: Right atrial size was normal in size. Pericardium: There is no evidence of pericardial effusion. Mitral Valve: The mitral valve is normal in structure. Normal mobility of the mitral valve leaflets. Mild mitral valve regurgitation. No evidence of mitral valve stenosis. MV peak gradient, 5.1 mmHg. The mean mitral valve gradient is 2.0 mmHg. Tricuspid Valve: The tricuspid valve is normal in structure. Tricuspid valve regurgitation is mild . No evidence of tricuspid stenosis. Aortic Valve: The aortic valve is normal in structure. Aortic valve regurgitation is mild. Aortic regurgitation PHT measures 515 msec. Mild to moderate aortic valve sclerosis/calcification is present, without any evidence of aortic stenosis. Aortic valve  mean gradient measures 3.0 mmHg. Aortic valve peak gradient measures 5.6 mmHg. Aortic valve area, by VTI measures 2.66 cm. Pulmonic Valve: The pulmonic valve was normal in structure. Pulmonic valve regurgitation is not visualized. No evidence of pulmonic stenosis. Aorta: The aortic root is normal in size and structure. Venous: The inferior vena cava is normal in size with greater than 50% respiratory variability, suggesting right atrial pressure of 3 mmHg. IAS/Shunts: No atrial level shunt detected by color flow Doppler. Additional Comments: A ICD wire is visualized.  LEFT VENTRICLE PLAX 2D LVIDd:         4.60 cm LVIDs:         3.80 cm LV PW:         1.20 cm LV IVS:        1.40 cm LVOT diam:     2.20 cm LV SV:         66 LV SV Index:   30 LVOT Area:     3.80 cm  RIGHT VENTRICLE             IVC RV Basal diam:  3.00 cm     IVC diam: 1.60 cm RV S  prime:     11.20 cm/s TAPSE (M-mode): 1.6 cm LEFT ATRIUM             Index       RIGHT ATRIUM           Index LA diam:        3.00 cm 1.37 cm/m  RA Area:     16.90 cm LA Vol (A2C):   55.8 ml 25.47 ml/m RA Volume:   41.60 ml  18.99 ml/m LA Vol (A4C):   54.8 ml 25.01 ml/m LA Biplane Vol: 57.8 ml 26.38 ml/m  AORTIC VALVE AV Area (Vmax):    2.68 cm AV Area (Vmean):   2.79 cm AV Area (VTI):     2.66 cm AV Vmax:           118.00 cm/s AV Vmean:          82.500 cm/s AV VTI:            0.248 m AV Peak Grad:      5.6 mmHg AV Mean Grad:      3.0 mmHg LVOT Vmax:         83.34 cm/s LVOT Vmean:        60.460 cm/s LVOT VTI:          0.174 m LVOT/AV VTI ratio: 0.70 AI  PHT:            515 msec  AORTA Ao Root diam: 3.90 cm MITRAL VALVE               TRICUSPID VALVE MV Area (PHT): 3.27 cm    TR Peak grad:   22.8 mmHg MV Peak grad:  5.1 mmHg    TR Vmax:        239.00 cm/s MV Mean grad:  2.0 mmHg MV Vmax:       1.13 m/s    SHUNTS MV Vmean:      57.0 cm/s   Systemic VTI:  0.17 m MV Decel Time: 232 msec    Systemic Diam: 2.20 cm MV E velocity: 63.10 cm/s MV A velocity: 86.80 cm/s MV E/A ratio:  0.73 Ena Dawley MD Electronically signed by Ena Dawley MD Signature Date/Time: 08/12/2019/12:36:33 PM    Final     Cardiac Studies   Echo 08/12/19 1. Since the last study on 08/18/2017 LVEf has further decreased from  35-40% to 25-30% with diffuse hypokinesis and apical aneurysmal  dilatation.  2. Left ventricular ejection fraction, by estimation, is 25 to 30%. The  left ventricle has severely decreased function. The left ventricle has no  regional wall motion abnormalities. The left ventricular internal cavity  size was mildly dilated. There is  moderate concentric left ventricular hypertrophy. Left ventricular  diastolic parameters are consistent with Grade I diastolic dysfunction  (impaired relaxation). Elevated left atrial pressure.  3. Right ventricular systolic function is normal. The right ventricular   size is normal. A ICD wire is visualized. There is normal pulmonary artery  systolic pressure. The estimated right ventricular systolic pressure is  0000000 mmHg.  4. The mitral valve is normal in structure. Mild mitral valve  regurgitation. No evidence of mitral stenosis.  5. The aortic valve is normal in structure. Aortic valve regurgitation is  mild. Mild to moderate aortic valve sclerosis/calcification is present,  without any evidence of aortic stenosis.  6. The inferior vena cava is normal in size with greater than 50%  respiratory variability, suggesting right atrial pressure of 3 mmHg.   Stress test 08/12/19  Defect 1: There is a large defect of moderate severity present in the mid anteroseptal, mid inferoseptal, mid inferior, apical anterior, apical septal, apical inferior, apical lateral and apex location.  Findings consistent with prior myocardial infarction. No significant ischemic territories.  This is a high risk study.  Nuclear stress EF: 33%.  Patient Profile     74 y.o. 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   Medication titration after multiple PCI has been difficult to given orthostasis and low blood pressure.   Assessment & Plan    1. Unstable angina with hx of CAD s/p Multiple PCIs  (Last cath 07/27/2018 with non obstructive disease) - Chest pain resolved on nitro. Hs-troponin 23>>13. Stress test was high risk with findings consistent with prior myocardial infarction. No significant ischemic territories. Plan for cardiac catheterization given reduced LVEF to 25-30% from 35-40%.  - INR 2.0 today. Was 2.4 yesterday. Will keep NPO for possible cath with INR check in afternoon.  - The patient understands that risks include but are not limited to stroke (1 in 1000),  death (1 in 1000), kidney failure [usually temporary] (1 in 500),  bleeding (1 in 200), allergic reaction [possibly serious] (1 in 200), and agrees to proceed.   2. Chronic systolic CHF - Echo this admission showed LVEF decreased from 35-40% to 25-30% with diffuse hypokinesis and apical aneurysmal dilatation.  - Continue Entresto 24/26mg  BID and Inderal 20mg  daily. Hx of low blood pressure and orthostasis.  - Apprears euvolemic  3. Recurrent DVT/ Lupus anticoagulant positive  - On Warfarin chronically - INR 2.0 today. Pharmacy to start heparin and hold coumadin  4. HLD - Hx of statin intolerance  - 08/12/2019: Cholesterol 169; HDL 39; LDL Cholesterol 106; Triglycerides 118; VLDL 24  - Continue Crestor 20mg  qd   For questions or updates, please contact [place] Please consult [email or web address] for contact info under        SignedLeanor Kail, PA  08/14/2019, 7:50 AM

## 2019-08-14 NOTE — Progress Notes (Signed)
ANTICOAGULATION CONSULT NOTE  Pharmacy Consult for heparin Indication: chest pain/ACS and history of multiple DVT, positive lupus anticoagulant  Heparin Dosing Weight: 94.4 kg  Labs: Recent Labs    08/11/19 2130 08/11/19 2130 08/12/19 0423 08/12/19 0423 08/13/19 0354 08/13/19 0932 08/14/19 0346 08/14/19 1112  HGB 13.9   < > 13.5   < >  --  15.2 14.1  --   HCT 41.1   < > 39.9  --   --  45.3 42.8  --   PLT 174   < > 166  --   --  183 179  --   LABPROT  --   --  27.2*  --  25.0*  --  21.6*  --   INR  --   --  2.6*  --  2.4*  --  2.0*  --   CREATININE 1.06  --  0.99  --   --   --   --  1.04   < > = values in this interval not displayed.    Assessment: 74 year old male with history of multiple DVT, positive lupus anticoagulation on warfarin prior to admission, presented with chest pain 5/21. Pharmacy consulted to dose heparin for ACS when INR<2. -INR= 2.0  Home warfarin regimen: 2.5 mg daily  S/p cath this afternoon, no significant changes from previous cath. Will resume warfarin tonight.    Goal of Therapy:  INR goal 2-3 Heparin level 0.3-0.7 units/ml Monitor platelets by anticoagulation protocol: Yes   Plan:  Warfarin 4mg  tonight Daily INR  Erin Hearing PharmD., BCPS Clinical Pharmacist 08/14/2019 7:15 PM

## 2019-08-14 NOTE — Progress Notes (Signed)
Pt refused cpap at this time, doesn't want to wear tonight.  VSS currently, sat 95% on room air.

## 2019-08-15 LAB — HEMOGLOBIN A1C
Hgb A1c MFr Bld: 5.4 % (ref 4.8–5.6)
Mean Plasma Glucose: 108.28 mg/dL

## 2019-08-15 LAB — BASIC METABOLIC PANEL
Anion gap: 6 (ref 5–15)
BUN: 15 mg/dL (ref 8–23)
CO2: 25 mmol/L (ref 22–32)
Calcium: 9.2 mg/dL (ref 8.9–10.3)
Chloride: 109 mmol/L (ref 98–111)
Creatinine, Ser: 1.03 mg/dL (ref 0.61–1.24)
GFR calc Af Amer: >60 mL/min (ref >60)
GFR calc non Af Amer: >60 mL/min (ref >60)
Glucose, Bld: 124 mg/dL — ABNORMAL HIGH (ref 70–99)
Potassium: 4.2 mmol/L (ref 3.5–5.1)
Sodium: 140 mmol/L (ref 135–145)

## 2019-08-15 LAB — CBC
HCT: 41.3 % (ref 39.0–52.0)
Hemoglobin: 13.8 g/dL (ref 13.0–17.0)
MCH: 31.2 pg (ref 26.0–34.0)
MCHC: 33.4 g/dL (ref 30.0–36.0)
MCV: 93.2 fL (ref 80.0–100.0)
Platelets: 188 10*3/uL (ref 150–400)
RBC: 4.43 MIL/uL (ref 4.22–5.81)
RDW: 14.8 % (ref 11.5–15.5)
WBC: 5.9 10*3/uL (ref 4.0–10.5)
nRBC: 0 % (ref 0.0–0.2)

## 2019-08-15 LAB — PROTIME-INR
INR: 1.6 — ABNORMAL HIGH (ref 0.8–1.2)
Prothrombin Time: 18.6 seconds — ABNORMAL HIGH (ref 11.4–15.2)

## 2019-08-15 MED ORDER — WARFARIN SODIUM 5 MG PO TABS: 2.5000 mg | ORAL_TABLET | ORAL | Status: DC

## 2019-08-15 MED ORDER — ENTRESTO 24-26 MG PO TABS: 1.0000 | ORAL_TABLET | Freq: Two times a day (BID) | ORAL | Status: DC

## 2019-08-15 MED ORDER — EZETIMIBE 10 MG PO TABS: 10.0000 mg | ORAL_TABLET | Freq: Every day | ORAL | 11 refills | Status: AC

## 2019-08-15 MED ORDER — ASPIRIN 81 MG PO CHEW: 81.0000 mg | CHEWABLE_TABLET | Freq: Every day | ORAL | 11 refills | Status: DC

## 2019-08-15 MED ORDER — EZETIMIBE 10 MG PO TABS
10.0000 mg | ORAL_TABLET | Freq: Every day | ORAL | Status: DC
  Administered 2019-08-15: 10 mg via ORAL
  Filled 2019-08-15: qty 1

## 2019-08-15 MED FILL — ASPIRIN LOW DOSE 81 MG CHEW: 81 | 30 days supply | Qty: 30 | Fill #0

## 2019-08-15 MED FILL — EZETIMIBE 10 MG TABS: 10 | 30 days supply | Qty: 30 | Fill #0

## 2019-08-15 NOTE — Discharge Instructions (Signed)
Angina  Angina is very bad discomfort or pain in the chest, neck, arm, jaw, or back. The discomfort is caused by a lack of blood in the middle layer of the heart wall (myocardium). What are the causes? This condition is caused by a buildup of fat and cholesterol (plaque) in your arteries (atherosclerosis). This buildup narrows the arteries and makes it hard for blood to flow. What increases the risk? You are more likely to develop this condition if:  You have high levels of cholesterol in your blood.  You have high blood pressure (hypertension).  You have diabetes.  You have a family history of heart disease.  You are not active, or you do not exercise enough.  You feel sad (depressed).  You have been treated with high energy rays (radiation) on the left side of your chest. Other risk factors are:  Using tobacco.  Being very overweight (obese).  Eating a diet high in unhealthy fats (saturated fats).  Having stress, or being exposed to things that cause stress.  Using drugs, such as cocaine. Women have a greater risk for angina if:  They are older than 40.  They have stopped having their period (are in postmenopause). What are the signs or symptoms? Common symptoms of this condition in both men and women may include:  Chest pain, which may: ? Feel like a crushing or squeezing in the chest. ? Feel like a tightness, pressure, fullness, or heaviness in the chest. ? Last for more than a few minutes at a time. ? Stop and come back (recur) after a few minutes.  Pain in the neck, arm, jaw, or back.  Heartburn or upset stomach (indigestion) for no reason.  Being short of breath.  Feeling sick to your stomach (nauseous).  Sudden cold sweats. Women and people with diabetes may have other symptoms that are not usual, such as feeling:  Tired (fatigue).  Worried or nervous (anxious) for no reason.  Weak for no reason.  Dizzy or passing out (fainting). How is this  treated? This condition may be treated with:  Medicines. These are given to: ? Prevent blood clots. ? Prevent heart attack. ? Relax blood vessels and improve blood flow to the heart (nitrates). ? Reduce blood pressure. ? Improve the pumping action of the heart. ? Reduce fat and cholesterol in the blood.  A procedure to widen a narrowed or blocked artery in the heart (angioplasty).  Surgery to allow blood to go around a blocked artery (coronary artery bypass surgery). Follow these instructions at home: Medicines  Take over-the-counter and prescription medicines only as told by your doctor.  Do not take these medicines unless your doctor says that you can: ? NSAIDs. These include:  Ibuprofen.  Naproxen. ? Vitamin supplements that have vitamin A, vitamin E, or both. ? Hormone therapy that contains estrogen with or without progestin. Eating and drinking   Eat a heart-healthy diet that includes: ? Lots of fresh fruits and vegetables. ? Whole grains. ? Low-fat (lean) protein. ? Low-fat dairy products.  Follow instructions from your doctor about what you cannot eat or drink. Activity  Follow an exercise program that your doctor tells you.  Talk with your doctor about joining a program to help improve the health of your heart (cardiac rehab).  When you feel tired, take a break. Plan breaks if you know you are going to feel tired. Lifestyle   Do not use any products that contain nicotine or tobacco. This includes cigarettes, e-cigarettes, and  chewing tobacco. If you need help quitting, ask your doctor.  If your doctor says you can drink alcohol: ? Limit how much you use to:  0-1 drink a day for women who are not pregnant.  0-2 drinks a day for men. ? Be aware of how much alcohol is in your drink. In the [place] one drink equals:  One 12 oz bottle of beer (355 mL).  One 5 oz glass of wine (148 mL).  One 1 oz glass of hard liquor (44 mL). General instructions  Stay  at a healthy weight. If your doctor tells you to do so, work with him or her to lose weight.  Learn to deal with stress. If you need help, ask your doctor.  Keep your vaccines up to date. Get a flu shot every year.  Talk with your doctor if you feel sad. Take a screening test to see if you are at risk for depression.  Work with your doctor to manage any other health problems that you have. These may include diabetes or high blood pressure.  Keep all follow-up visits as told by your doctor. This is important. Get help right away if:  You have pain in your chest, neck, arm, jaw, or back, and the pain: ? Lasts more than a few minutes. ? Comes back. ? Does not get better after you take medicine under your tongue (sublingual nitroglycerin). ? Keeps getting worse. ? Comes more often.  You have any of these problems for no reason: ? Sweating a lot. ? Heartburn or upset stomach. ? Shortness of breath. ? Trouble breathing. ? Feeling sick to your stomach. ? Throwing up (vomiting). ? Feeling more tired than normal. ? Feeling nervous or worrying more than normal. ? Weakness.  You are suddenly dizzy or light-headed.  You pass out. These symptoms may be an emergency. Do not wait to see if the symptoms will go away. Get medical help right away. Call your local emergency services (911 in the [place] Do not drive yourself to the hospital. Summary  Angina is very bad discomfort or pain in the chest, neck, arm, neck, or back.  Symptoms include chest pain, heartburn or upset stomach for no reason, and shortness of breath.  Women or people with diabetes may have symptoms that are not usual, such as feeling nervous or worried for no reason, weak for no reason, or tired.  Take all medicines only as told by your doctor.  You should eat a heart-healthy diet and follow an exercise program. This information is not intended to replace advice given to you by your health care provider. Make sure you  discuss any questions you have with your health care provider. Document Revised: 10/25/2017 Document Reviewed: 10/25/2017 Elsevier Patient Education  [place].   Coronary Microvascular Disease  Coronary microvascular disease (MVD) is a heart (coronary) disease in which the walls and inner lining of the heart's small arteries are damaged. The damage may lead to a tightening (spasm) of the arteries, which causes decreased blood flow to the heart. This is different from coronary artery disease (CAD), in which plaque builds up in the large arteries of the heart. The risk factors for MVD are similar to those for CAD, and microvascular disease is more common in women. What are the causes? Common causes of this condition include:  High blood pressure.  Diabetes.  Smoking.  High cholesterol.  Being overweight or obese.  Inactivity (sedentary lifestyle).  Unhealthy diet.  Family history of  early heart disease.  Being a male.  Having anxiety or depression. The causes of MVD are also considered to be risk factors for MVD. What are the signs or symptoms? Symptoms of this condition include:  Chest pain (angina). This may feel like a burning, squeezing, or pressure sensation. It can spread (radiate) to the neck, arm, back, or jaw. Angina usually lasts at least 10 minutes and can last longer than 30 minutes.  Shortness of breath.  Nausea.  Weakness.  Dizziness.  Sweating  Fluttering or fast heartbeat (palpitations).  Sleep problems.  Fatigue.  Lack of energy. People often notice their first symptoms of MVD during their routine daily activities and at times of mental stress. Symptoms are often less noticeable during physical activity. How is this diagnosed? This condition may be diagnosed based on:  Your medical history.  Your symptoms.  A physical exam.  Blood tests.  Electrocardiogram (ECG). This test checks the electrical activity in the heart.  PET  scan of the heart (cardiac PET scan). This imaging test makes pictures to show healthy and damaged heart muscle.  MRI of the heart (cardiac MRI). This imaging test shows the function and structure of the heart and blood vessels (cardiovascular system).  An exercise stress test. For this test, your heart function and blood pressure are monitored before, during, and after exercise. An ECG is used during this test.  Coronary angiogram with additional blood flow testing. For this procedure, dye is injected into your coronary arteries before X-rays are taken to check blood flow and heart function. Sometimes, standard tests do not show MVD. Standard coronary angiogram only shows blockages in the large arteries. You may still be diagnosed with MVD, even if the angiogram results are normal. How is this treated? This condition is managed with medicines, such as:  Medicines to control cholesterol levels (statins).  Medicines to lower blood pressure (ACE inhibitors or beta blockers).  Aspirin to prevent blood clots or to control inflammation.  Medicine to treat angina and improve blood flow to the heart (nitroglycerin). Follow these instructions at home: Lifestyle   Be physically active every day. Ask your health care provider how much physical activity you need and what types of exercise are best for you.  Do not use any products that contain nicotine or tobacco, such as cigarettes and e-cigarettes. If you need help quitting, ask your health care provider.  Follow a heart-healthy diet. A diet and nutrition specialist (dietitian) can help you learn about healthy food options and changes. In general, eat plenty of fruits and vegetables, lean meats, and whole grains. General instructions  Take over-the-counter and prescription medicines only as told by your health care provider.  If you are overweight or obese, work with your health care provider to lose weight safely.  Manage any other health  conditions that affect your heart, such as hypertension and diabetes.  Keep all follow-up visits as told by your health care provider. This is important. Contact a health care provider if:  You have chest pain.  You have trouble doing your daily activities. Get help right away if:  You have discomfort in the center of your chest that: ? Lasts for more than a few minutes. ? Goes away and comes back (recurs).  You have shortness of breath.  You have pressure, pain, or a sense of fullness or squeezing in your chest.  You feel nauseous.  You feel light-headed.  You have chills. These symptoms may represent a serious problem that  is an emergency. Do not wait to see if the symptoms will go away. Get medical help right away. Call your local emergency services (911 in the [place] Do not drive yourself to the hospital. Summary  Coronary microvascular disease (MVD) is a heart (coronary) disease in which the walls and inner lining of the heart's small arteries are damaged.  Symptoms of this condition include chest pain (angina). This usually lasts at least 10 minutes and can last longer than 30 minutes.  This condition is diagnosed by your health care provider based on your medical history, a physical exam, and test results.  This condition is managed with medicines and other lifestyle changes. This information is not intended to replace advice given to you by your health care provider. Make sure you discuss any questions you have with your health care provider. Document Revised: 02/19/2017 Document Reviewed: 07/09/2016 Elsevier Patient Education  [place].

## 2019-08-15 NOTE — Care Management (Signed)
08-15-19 1034 Ambulatory referral sent via Epic to [facility] for PT evaluation and treatment for increased weakness. Office to call the patient with an appointment time. Patient is aware that the office will call- per patient he will have transportation. No further needs from Case Manager at this time. Graves-Bigelow, Ocie Cornfield, RN, BSN Case Manager

## 2019-08-15 NOTE — Discharge Summary (Signed)
Discharge Summary    Patient ID: Kevin Hart MRN: [id number]; DOB: [birth year]  Admit date: 08/11/2019 Discharge date: 08/15/2019  Primary Care Provider: Carlyle Basques, MD  Primary Cardiologist: Peter Martinique, MD  Primary Electrophysiologist:  Thompson Grayer, MD   Discharge Diagnoses    Principal Problem:   Unstable angina [facility] Active Problems:   Recurrent acute deep vein thrombosis (DVT) of lower extremity ([place])   Chronic systolic heart failure ([place])   ICD (implantable cardioverter-defibrillator) in place   Chest pain   Hypokalemia   OSA on CPAP  Diagnostic Studies/Procedures    LEFT HEART CATH AND CORONARY ANGIOGRAPHY 08/14/19  Conclusion    Prox RCA lesion is 30% stenosed.  Prox LAD to Mid LAD lesion is 35% stenosed.   The Left main is a normal vessel and trifurcates into a large LAD, ramus immediate vessel and dominant left circumflex coronary artery.  The LAD stent remains patent and has residual in-stent narrowing of 30 to 35% not significantly changed from the prior catheterization.  The ramus intermediate and dominant circumflex are angiographically normal.  The RCA is a small nondominant vessel with smooth 30% proximal stenosis.  LVEDP 5 mmHg.  RECOMMENDATION: Medical therapy. Reinitiate warfarin therapy.  GDMT therapy for his reduced LV function.   Diagnostic Dominance: Left  Intervention    Echo 08/12/19 1. Since the last study on 08/18/2017 LVEf has further decreased from  35-40% to 25-30% with diffuse hypokinesis and apical aneurysmal  dilatation.  2. Left ventricular ejection fraction, by estimation, is 25 to 30%. The  left ventricle has severely decreased function. The left ventricle has no  regional wall motion abnormalities. The left ventricular internal cavity  size was mildly dilated. There is  moderate concentric left ventricular hypertrophy. Left ventricular  diastolic parameters are consistent with Grade I diastolic dysfunction    (impaired relaxation). Elevated left atrial pressure.  3. Right ventricular systolic function is normal. The right ventricular  size is normal. A ICD wire is visualized. There is normal pulmonary artery  systolic pressure. The estimated right ventricular systolic pressure is  0000000 mmHg.  4. The mitral valve is normal in structure. Mild mitral valve  regurgitation. No evidence of mitral stenosis.  5. The aortic valve is normal in structure. Aortic valve regurgitation is  mild. Mild to moderate aortic valve sclerosis/calcification is present,  without any evidence of aortic stenosis.  6. The inferior vena cava is normal in size with greater than 50%  respiratory variability, suggesting right atrial pressure of 3 mmHg.   History of Present Illness     Kevin Hart is a 74 y.o. male with PMH of CAD s/p multiple PCI, multiple DVTs on Coumadin with positive lupus anticoagulant, chronic systolic heart failure, hypertension, ICM s/p St Jude single-chamber ICD 2016 and OSA on CPAP admitted for [place] with nitro responsive.   Medication titration after multiple PCI has been difficult to given orthostasis and low blood pressure.  Last cath 07/27/2018 with non obstructive disease. Hx of renal cyst hemorrhage.  He has difficulty with statins.  Dr. Claiborne Billings follows for Sleep.  Presented to [facility] early AM with chest pain which wakened him form sleep. Also had paresthesias of the left arm.  Pain below left nipple and similar to prior anginal pain.   He rated the pain 5-6/10 - he took 3 SL NTG and pain decreased to 2/10.  No associated SOB, diaphoresis, nausea or vomiting.   Pain free on IV NTG. EKG showed SR  at 85, LAD and Q waves V1-3 similar to 06/22/19 though QTC today is 503 ms.   Hospital Course     Consultants: None  1. Unstable angina with hx of CAD s/p Multiple PCIs   - Chest pain resolved on nitro. Hs-troponin 23>>13. Stress test was high risk with findings consistent with prior  myocardial infarction. No significant ischemic territories. Underwent cardiac catheterization given reduced LVEF to 25-30% from 35-40% showing patent stent with minimal in-stent stenosis without obstruction. No recurrent chest pain. Continue ASA, statin and BB.   2. Chronic systolic CHF - Echo this admission showed LVEF decreased from 35-40% to 25-30% with diffuse hypokinesis and apical aneurysmal dilatation. Continue Entresto 24/26mg  BID and Inderal 20mg  daily (due to tremors). Hx of low blood pressure and orthostasis. He remains euvolemic.   3. Recurrent DVT/ Lupus anticoagulant positive  - On Warfarin chronically>>> transitioned to heparin for cath.  - Coumadin dose adjusted by pharmacy team for few days.  - INR check with PCP in 3 days   4. HLD - Hx of statin intolerance  - 08/12/2019: Cholesterol 169; HDL 39; LDL Cholesterol 106; Triglycerides 118; VLDL 24  - Continue Crestor 20mg  qd (tolerating well) - Patient is agree to add Zetia and follow up in lipid clinic  Did the patient have an acute coronary syndrome (MI, NSTEMI, STEMI, etc) this admission?:  No                               Did the patient have a percutaneous coronary intervention (stent / angioplasty)?:  No.    Physical Exam  Constitutional: He is oriented to person, place, and time and well-developed, well-nourished, and in no distress.  HENT:  Head: Normocephalic and atraumatic.  Eyes: Pupils are equal, round, and reactive to light. Conjunctivae are normal.  Cardiovascular: Normal rate and regular rhythm.  Right radial cath site without hematoma   Pulmonary/Chest: Effort normal and breath sounds normal.  Abdominal: Soft. Bowel sounds are normal.  Musculoskeletal:        General: Normal range of motion.     Cervical back: Normal range of motion and neck supple.  Neurological: He is alert and oriented to person, place, and time.  Skin: Skin is warm and dry.  Psychiatric: Affect normal.    Discharge Vitals Blood  pressure 99/70, pulse 71, temperature (!) 97.5 F (36.4 C), temperature source Oral, resp. rate 16, height 5\' 10"  (1.778 m), weight 100.2 kg, SpO2 94 %.  Filed Weights   08/13/19 0329 08/14/19 0358 08/15/19 0309  Weight: 100.2 kg 99.6 kg 100.2 kg    Labs & Radiologic Studies    CBC Recent Labs    08/14/19 0346 08/15/19 0352  WBC 5.9 5.9  HGB 14.1 13.8  HCT 42.8 41.3  MCV 94.7 93.2  PLT 179 0000000   Basic Metabolic Panel Recent Labs    08/14/19 1112 08/15/19 0352  NA 140 140  K 4.3 4.2  CL 105 109  CO2 25 25  GLUCOSE 105* 124*  BUN 14 15  CREATININE 1.04 1.03  CALCIUM 9.5 9.2   High Sensitivity Troponin:   Recent Labs  Lab 08/11/19 0608 08/11/19 0920  TROPONINIHS 23* 19*    Hemoglobin A1C Recent Labs    08/15/19 0352  HGBA1C 5.4  _____________  CARDIAC CATHETERIZATION  Result Date: 08/14/2019  Prox RCA lesion is 30% stenosed.  Prox LAD to Mid LAD lesion is 35% stenosed.  The Left main is a normal vessel and trifurcates into a large LAD, ramus immediate vessel and dominant left circumflex coronary artery.  The LAD stent remains patent and has residual in-stent narrowing of 30 to 35% not significantly changed from the prior catheterization.  The ramus intermediate and dominant circumflex are angiographically normal.  The RCA is a small nondominant vessel with smooth 30% proximal stenosis. LVEDP 5 mmHg. RECOMMENDATION: Medical therapy. Reinitiate warfarin therapy.  GDMT therapy for his reduced LV function.   NM Myocar Multi W/Spect W/Wall Motion / EF  Result Date: 08/12/2019  Defect 1: There is a large defect of moderate severity present in the mid anteroseptal, mid inferoseptal, mid inferior, apical anterior, apical septal, apical inferior, apical lateral and apex location.  Findings consistent with prior myocardial infarction. No significant ischemic territories.  This is a high risk study.  Nuclear stress EF: 33%.    DG Chest Portable 1 View  Result Date:  08/11/2019 CLINICAL DATA:  Chest pain. EXAM: PORTABLE CHEST 1 VIEW COMPARISON:  07/26/2018 FINDINGS: The right ventricular pacer wire/AICD is stable. The cardiac silhouette, mediastinal and hilar contours are within normal limits and unchanged. Coronary artery stent is noted. The lungs are clear. No pleural effusions. No pulmonary lesions. IMPRESSION: No acute cardiopulmonary findings. Electronically Signed   By: Marijo Sanes M.D.   On: 08/11/2019 06:42   ECHOCARDIOGRAM COMPLETE  Result Date: 08/12/2019    ECHOCARDIOGRAM REPORT   Patient Name:   DOCK SHAMBURG Date of Exam: 08/12/2019 Medical Rec #:  [id number]       Height:       70.0 in Accession #:    [id number]      Weight:       224.2 lb Date of Birth:  [birth year]       BSA:          2.191 m Patient Age:    56 years        BP:           106/75 mmHg Patient Gender: M               HR:           73 bpm. Exam Location:  Inpatient Procedure: 2D Echo, Cardiac Doppler and Color Doppler Indications:    Chest pain                 Cardiomyopathy-Ischemic  History:        Patient has prior history of Echocardiogram examinations, most                 recent 08/18/2017. Angina, Previous Myocardial Infarction and                 CAD, Defibrillator; Risk Factors:Dyslipidemia. H/o DVT. GERD.  Sonographer:    Clayton Lefort RDCS (AE) Referring Phys: [place]  1. Since the last study on 08/18/2017 LVEf has further decreased from 35-40% to 25-30% with diffuse hypokinesis and apical aneurysmal dilatation.  2. Left ventricular ejection fraction, by estimation, is 25 to 30%. The left ventricle has severely decreased function. The left ventricle has no regional wall motion abnormalities. The left ventricular internal cavity size was mildly dilated. There is moderate concentric left ventricular hypertrophy. Left ventricular diastolic parameters are consistent with Grade I diastolic dysfunction (impaired relaxation). Elevated left atrial pressure.  3. Right  ventricular systolic function is normal. The right ventricular size is normal. A ICD wire is visualized. There is normal  pulmonary artery systolic pressure. The estimated right ventricular systolic pressure is 0000000 mmHg.  4. The mitral valve is normal in structure. Mild mitral valve regurgitation. No evidence of mitral stenosis.  5. The aortic valve is normal in structure. Aortic valve regurgitation is mild. Mild to moderate aortic valve sclerosis/calcification is present, without any evidence of aortic stenosis.  6. The inferior vena cava is normal in size with greater than 50% respiratory variability, suggesting right atrial pressure of 3 mmHg. FINDINGS  Left Ventricle: Left ventricular ejection fraction, by estimation, is 25 to 30%. The left ventricle has severely decreased function. The left ventricle has no regional wall motion abnormalities. The left ventricular internal cavity size was mildly dilated. There is moderate concentric left ventricular hypertrophy. Left ventricular diastolic parameters are consistent with Grade I diastolic dysfunction (impaired relaxation). Elevated left atrial pressure.  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 Right Ventricle: The right ventricular size is normal. No increase in right ventricular wall thickness. Right ventricular systolic function is normal. There is normal pulmonary artery systolic pressure. The tricuspid regurgitant velocity is 2.39 m/s, and  with an assumed right atrial pressure of 3 mmHg, the estimated right ventricular systolic pressure is 0000000 mmHg. Left Atrium: Left atrial size was normal in size. Right Atrium: Right atrial size was normal in size. Pericardium: There is no evidence of pericardial effusion. Mitral Valve: The mitral valve is normal in structure. Normal mobility of the mitral valve leaflets. Mild mitral valve regurgitation. No evidence of mitral valve  stenosis. MV peak gradient, 5.1 mmHg. The mean mitral valve gradient is 2.0 mmHg. Tricuspid Valve: The tricuspid valve is normal in structure. Tricuspid valve regurgitation is mild . No evidence of tricuspid stenosis. Aortic Valve: The aortic valve is normal in structure. Aortic valve regurgitation is mild. Aortic regurgitation PHT measures 515 msec. Mild to moderate aortic valve sclerosis/calcification is present, without any evidence of aortic stenosis. Aortic valve  mean gradient measures 3.0 mmHg. Aortic valve peak gradient measures 5.6 mmHg. Aortic valve area, by VTI measures 2.66 cm. Pulmonic Valve: The pulmonic valve was normal in structure. Pulmonic valve regurgitation is not visualized. No evidence of pulmonic stenosis. Aorta: The aortic root is normal in size and structure. Venous: The inferior vena cava is normal in size with greater than 50% respiratory variability, suggesting right atrial pressure of 3 mmHg. IAS/Shunts: No atrial level shunt detected by color flow Doppler. Additional Comments: A ICD wire is visualized.  LEFT VENTRICLE PLAX 2D LVIDd:         4.60 cm LVIDs:         3.80 cm LV PW:         1.20 cm LV IVS:        1.40 cm LVOT diam:     2.20 cm LV SV:         66 LV SV Index:   30 LVOT Area:     3.80 cm  RIGHT VENTRICLE             IVC RV Basal diam:  3.00 cm     IVC diam: 1.60 cm RV S prime:     11.20 cm/s TAPSE (M-mode): 1.6 cm LEFT ATRIUM             Index       RIGHT ATRIUM           Index LA diam:  3.00 cm 1.37 cm/m  RA Area:     16.90 cm LA Vol (A2C):   55.8 ml 25.47 ml/m RA Volume:   41.60 ml  18.99 ml/m LA Vol (A4C):   54.8 ml 25.01 ml/m LA Biplane Vol: 57.8 ml 26.38 ml/m  AORTIC VALVE AV Area (Vmax):    2.68 cm AV Area (Vmean):   2.79 cm AV Area (VTI):     2.66 cm AV Vmax:           118.00 cm/s AV Vmean:          82.500 cm/s AV VTI:            0.248 m AV Peak Grad:      5.6 mmHg AV Mean Grad:      3.0 mmHg LVOT Vmax:         83.34 cm/s LVOT Vmean:        60.460 cm/s  LVOT VTI:          0.174 m LVOT/AV VTI ratio: 0.70 AI PHT:            515 msec  AORTA Ao Root diam: 3.90 cm MITRAL VALVE               TRICUSPID VALVE MV Area (PHT): 3.27 cm    TR Peak grad:   22.8 mmHg MV Peak grad:  5.1 mmHg    TR Vmax:        239.00 cm/s MV Mean grad:  2.0 mmHg MV Vmax:       1.13 m/s    SHUNTS MV Vmean:      57.0 cm/s   Systemic VTI:  0.17 m MV Decel Time: 232 msec    Systemic Diam: 2.20 cm MV E velocity: 63.10 cm/s MV A velocity: 86.80 cm/s MV E/A ratio:  0.73 Ena Dawley MD Electronically signed by Ena Dawley MD Signature Date/Time: 08/12/2019/12:36:33 PM    Final    Disposition   Pt is being discharged home today in good condition.  Follow-up Plans & Appointments    Follow-up Information    Make appointment with PCP for INR check on 08/18/19 Follow up.        Abigail Butts., PA-C. Go on 08/29/2019.   Specialty: Physician Assistant Why: @3 :45pm for hospital follow up with Dr. Doug Sou PA. Please arrive 15 minutes early  Contact information: [address] [place] [address] [phone]          Discharge Instructions    AMB Referral to [facility]   Complete by: As directed    Reason for referral: Follow-up patients for medication management   Provider to see patient: PharmD   Internal Lipid Clinic Referral Scheduling  Internal lipid clinic referrals are providers within [facility], who wish to refer established patients for routine management (help in starting PCSK9 inhibitor therapy) or advanced therapies.  Internal MD referral criteria:              1. All patients with LDL>190 mg/dL  2. All patients with Triglycerides >500 mg/dL  3. Patients with suspected or confirmed heterozygous familial hyperlipidemia (HeFH) or homozygous familial hyperlipidemia (HoFH)  4. Patients with family history of suspicious for genetic dyslipidemia desiring genetic testing  5. Patients refractory to standard guideline based  therapy  6. Patients with statin intolerance (failed 2 statins, one of which must be a high potency statin)  7. Patients who the provider desires to be seen by MD  Internal PharmD referral criteria:   1. Follow-up patients for medication management  2. Follow-up for compliance monitoring  3. Patients for drug education  4. Patients with statin intolerance  5. PCSK9 inhibitor education and prior authorization approvals  6. Patients with triglycerides <500 mg/dL  External Lipid Clinic Referral  External lipid clinic referrals are for providers outside of [facility], considered new clinic patients - automatically routed to MD schedule   Diet - low sodium heart healthy   Complete by: As directed    Discharge instructions   Complete by: As directed    No driving for 48 hours. No lifting over 5 lbs for 1 week. No sexual activity for 1 week. Keep procedure site clean & dry. If you notice increased pain, swelling, bleeding or pus, call/return!  You may shower, but no soaking baths/hot tubs/pools for 1 week.   Increase activity slowly   Complete by: As directed      Discharge Medications   Allergies as of 08/15/2019   No Known Allergies     Medication List    TAKE these medications   allopurinol 300 MG tablet Commonly known as: ZYLOPRIM Take 300 mg by mouth daily.   aspirin 81 MG chewable tablet Chew 1 tablet (81 mg total) by mouth daily.   Entresto 24-26 MG Generic drug: sacubitril-valsartan Take 1 tablet by mouth 2 (two) times daily.   ezetimibe 10 MG tablet Commonly known as: ZETIA Take 1 tablet (10 mg total) by mouth daily.   furosemide 40 MG tablet Commonly known as: LASIX Take 40 mg by mouth daily.   Multi-Vitamins Tabs Take 1 tablet by mouth daily.   nitroGLYCERIN 0.4 MG SL tablet Commonly known as: NITROSTAT Place 1 tablet (0.4 mg total) under the tongue every 5 (five) minutes x 3 doses as needed for chest pain.   pantoprazole 40 MG tablet Commonly  known as: PROTONIX Take 1 tablet (40 mg total) by mouth daily.   potassium chloride SA 20 MEQ tablet Commonly known as: KLOR-CON Take 20 mEq by mouth daily.   propranolol 20 MG tablet Commonly known as: INDERAL Take 20 mg by mouth 2 (two) times daily.   rosuvastatin 20 MG tablet Commonly known as: CRESTOR Take 1 tablet (20 mg total) by mouth daily.   warfarin 5 MG tablet Commonly known as: Coumadin Take 0.5-1 tablets (2.5-5 mg total) by mouth See admin instructions. Take 5mg  on 5/25 and 5/26 then resume home regimen of 2.5mg  daily except 5mg  on Friday What changed: additional instructions          Outstanding Labs/Studies   INR check with PCP Monday  Duration of Discharge Encounter   Greater than 30 minutes including physician time.  Jarrett Soho, PA 08/15/2019, 7:59 AM

## 2019-08-15 NOTE — Plan of Care (Signed)
  Problem: Education: Goal: Knowledge of General Education information will improve Description: Including pain rating scale, medication(s)/side effects and non-pharmacologic comfort measures Outcome: Adequate for Discharge   Problem: Health Behavior/Discharge Planning: Goal: Ability to manage health-related needs will improve Outcome: Adequate for Discharge   Problem: Clinical Measurements: Goal: Ability to maintain clinical measurements within normal limits will improve Outcome: Adequate for Discharge Goal: Will remain free from infection Outcome: Adequate for Discharge Goal: Diagnostic test results will improve Outcome: Adequate for Discharge Goal: Respiratory complications will improve Outcome: Adequate for Discharge Goal: Cardiovascular complication will be avoided Outcome: Adequate for Discharge   Problem: Activity: Goal: Risk for activity intolerance will decrease Outcome: Adequate for Discharge   Problem: Coping: Goal: Level of anxiety will decrease Outcome: Adequate for Discharge   Problem: Elimination: Goal: Will not experience complications related to bowel motility Outcome: Adequate for Discharge Goal: Will not experience complications related to urinary retention Outcome: Adequate for Discharge   Problem: Pain Managment: Goal: General experience of comfort will improve Outcome: Adequate for Discharge   Problem: Safety: Goal: Ability to remain free from injury will improve Outcome: Adequate for Discharge   Problem: Skin Integrity: Goal: Risk for impaired skin integrity will decrease Outcome: Adequate for Discharge   

## 2019-08-18 DIAGNOSIS — I714 Abdominal aortic aneurysm, without rupture: Secondary | ICD-10-CM | POA: Diagnosis not present

## 2019-08-18 DIAGNOSIS — I509 Heart failure, unspecified: Secondary | ICD-10-CM | POA: Diagnosis not present

## 2019-08-18 DIAGNOSIS — I2510 Atherosclerotic heart disease of native coronary artery without angina pectoris: Secondary | ICD-10-CM | POA: Diagnosis not present

## 2019-08-18 DIAGNOSIS — Z7901 Long term (current) use of anticoagulants: Secondary | ICD-10-CM | POA: Diagnosis not present

## 2019-08-28 ENCOUNTER — Ambulatory Visit: Payer: PPO | Attending: Physician Assistant

## 2019-08-28 ENCOUNTER — Other Ambulatory Visit: Payer: Self-pay

## 2019-08-28 DIAGNOSIS — R293 Abnormal posture: Secondary | ICD-10-CM | POA: Insufficient documentation

## 2019-08-28 DIAGNOSIS — M6281 Muscle weakness (generalized): Secondary | ICD-10-CM | POA: Diagnosis not present

## 2019-08-28 DIAGNOSIS — R262 Difficulty in walking, not elsewhere classified: Secondary | ICD-10-CM

## 2019-08-28 NOTE — Therapy (Signed)
[facility] [address]  [place], [place], [address] Phone: [phone]   Fax:  [phone]  Physical Therapy Evaluation  Patient Details  Name: Kevin Hart MRN: [id number] Date of Birth: [birth year] Referring Provider (PT): Leanor Kail, [place]   Encounter Date: 08/28/2019  PT End of Session - 08/28/19 1217    Visit Number  1    Number of Visits  12    Date for PT Re-Evaluation  10/09/19    Authorization Type  [employer]    PT Start Time  1020    PT Stop Time  1102    PT Time Calculation (min)  42 min    Activity Tolerance  Patient tolerated treatment well    Behavior During Therapy  [facility] for tasks assessed/performed       Past Medical History:  Diagnosis Date  . Arthritis   . Bladder stone 10/26/2017  . BPH (benign prostatic hyperplasia)    takes Flomax daily  . CAD (coronary artery disease)    a. prior LAD stenting;  b. 06/2014 NSTEMI/PCI: LM nl, LAD 30p, 59m ISR (3.0x20 Promus DES), LCX nl, RCA nondom, nl, EF 25-30% ant, apical, dist inf [place].  . Cataracts, bilateral    immature  . Chronic systolic CHF (congestive heart failure) ([place]) 12/31/2014  . DVT (deep venous thrombosis) ([facility])    RECURRENT left leg  . Dyslipidemia   . GERD (gastroesophageal reflux disease)   . Gout   . History of kidney stones   . Hypertension   . Ischemic cardiomyopathy 07/06/2014  . Lupus anticoagulant positive   . Myocardial infarct ([place]) 2000  . Myocardial infarct ([place]) 2016  . S/P drug eluting coronary stent placement, 07/06/14, Promus 07/07/2014  . Sleep apnea    biPaP  . Subarachnoid bleed ([place]) 2015   after falling and hitting his head    Past Surgical History:  Procedure Laterality Date  . COLONOSCOPY    . EP IMPLANTABLE DEVICE [name] 11/27/2014   [employer] Fortify Assura VR  implanted by Dr Rayann Heman for primary prevention  . LEFT HEART CATH AND CORONARY ANGIOGRAPHY [name] 07/29/2018   Procedure: LEFT  HEART CATH AND CORONARY ANGIOGRAPHY;  Surgeon: Sherren Mocha, MD;  Location: [facility];  Service: Cardiovascular;  Laterality: [name];  . LEFT HEART CATH AND CORONARY ANGIOGRAPHY [name] 08/14/2019   Procedure: LEFT HEART CATH AND CORONARY ANGIOGRAPHY;  Surgeon: Troy Sine, MD;  Location: [facility];  Service: Cardiovascular;  Laterality: [name];  . LEFT HEART CATHETERIZATION WITH CORONARY ANGIOGRAM [name] 07/06/2014   Procedure: LEFT HEART CATHETERIZATION WITH CORONARY ANGIOGRAM;  Surgeon: Peter M Martinique, MD;  Location: [facility];  Service: Cardiovascular;  Laterality: [name];  . PERCUTANEOUS CORONARY STENT INTERVENTION (PCI-S) Right 07/06/2014   Procedure: PERCUTANEOUS CORONARY STENT INTERVENTION (PCI-S);  Surgeon: Peter M Martinique, MD;  Location: [facility] C[facility];  Service: Cardiovascular;  Laterality: Right;  . SHOULDER ARTHROSCOPY WITH SUBACROMIAL DECOMPRESSION Left 03/05/2016   Procedure: SHOULDER ARTHROSCOPY ROTATOR CUFF REPAIR WITH SUBACROMIAL DECOMPRESSION;  Surgeon: Tania Ade, MD;  Location: [facility];  Service: Orthopedics;  Laterality: Left;  SHOULDER ARTHROSCOPY ROTATOR CUFF REPAIR WITH SUBACROMIAL DECOMPRESSION  . SHOULDER CLOSED REDUCTION Left 11/29/2015   Procedure: CLOSED REDUCTION SHOULDER;  Surgeon: Tania Ade, MD;  Location: [facility];  Service: Orthopedics;  Laterality: Left;  . TRANSURETHRAL RESECTION OF PROSTATE [name] 02/24/2018   Procedure: TRANSURETHRAL RESECTION OF THE PROSTATE (TURP);  Surgeon: Irine Seal,  MD;  Location: [facility];  Service: Urology;  Laterality: [name];  . WRIST SURGERY Bilateral    plates and screws     There were no vitals filed for this visit.   Subjective Assessment - 08/28/19 1026    Subjective  Pt reports he has been getting weaker, unable to get himself up from the floor a couple of times when he had gotten down to do something. "Sometimes, I can do it, sometimes I can't". Pt fell carrying a big box to his car this year and lacerated his forehead with  his glasses, requiring 5 stitches. He fell in the past, dislocating his L shoulder and chipping a piece of the bone, limiting strength and ROM in L shoulder. He is overdue for an injection in his back, which helps relieve pain from his scoliosis. He is limping more right now because of that pain. He has overall veyr gradually started to weaken, which is frustrates him.    Pertinent History  DVT LLE 20 years ago, unstable angina (hospitalized 2 weeks ago), 2 MI's, scoliosis    Limitations  House hold activities;Lifting    Patient Stated Goals  Pt would like to get better at getting up off of the floor and be steadier when he is walking.    Currently in Pain?  Yes    Pain Score  2     Pain Location  Back    Pain Orientation  Lower    Pain Descriptors / Indicators  Other (Comment)   like someone is taking a towel and twisting it   Pain Type  Chronic pain    Pain Onset  1 to 4 weeks ago    Pain Frequency  Intermittent    Aggravating Factors   getting out of a chair (worse in lower), twisting    Pain Relieving Factors  sleeping         [facility] PT Assessment - 08/28/19 0001      Assessment   Medical Diagnosis  Progressive Weakness    Referring Provider (PT)  Leanor Kail, PA    Onset Date/Surgical Date  06/28/19    Hand Dominance  Right    Next MD Visit  08/29/19      Balance Screen   Has the patient fallen in the past 6 months  Yes    How many times?  2x    Has the patient had a decrease in activity level because of a fear of falling?   Yes    Is the patient reluctant to leave their home because of a fear of falling?   No      Home Environment   Living Environment  Private residence    Living Arrangements  Spouse/significant other    Type of [place] to enter   level entry through garage is more dangerous   Entrance Stairs-Number of Steps  2    Entrance Stairs-Rails  None    Home Layout  Two level;Bed/bath upstairs      Prior Function   Level of  Independence  Independent    Vocation  On disability   after heart attack at 74 yo   Leisure  Watch sports, geneology      ROM / Strength   AROM / PROM / Strength  AROM;Strength      AROM   Overall AROM Comments  R shoulder ROM WFL, L shoulder ROM deficits with inability to flex or abduct  90 with significant shoulder elevation and compensations      Strength   Strength Assessment Site  Shoulder    Right/Left Shoulder  Right;Left    Right Shoulder Flexion  4/5    Right Shoulder ABduction  4/5   shoulder pain   Right Shoulder Internal Rotation  4/5    Right Shoulder External Rotation  3-/5    Left Shoulder Flexion  2+/5    Left Shoulder ABduction  2+/5    Left Shoulder Internal Rotation  4-/5    Left Shoulder External Rotation  2+/5      Transfers   Five time sit to stand comments   used hands for first 2-3 reps and not for last 2-3, unable to achieve full upright posture with increased hip/knee flexion      Ambulation/Gait   Ambulation/Gait  Yes    Ambulation/Gait Assistance  6: Modified independent (Device/Increase time)    Ambulation Distance (Feet)  50 Feet    Assistive device  Straight cane    Gait Pattern  Step-through pattern;Decreased step length - right;Decreased step length - left;Decreased stance time - right;Decreased weight shift to right;Trendelenburg      Balance   Balance Assessed  Yes      Standardized Balance Assessment   Standardized Balance Assessment  Berg Balance Test;Timed Up and Go Test;Five Times Sit to Stand    Five times sit to stand comments   17 seconds    norm for age bracket is 12.6 seconds, Fall risk: recurrent     Berg Balance Test   Sit to Stand  Able to stand  independently using hands    Standing Unsupported  Able to stand safely 2 minutes    Sitting with Back Unsupported but Feet Supported on Floor or Stool  Able to sit safely and securely 2 minutes    Stand to Sit  Controls descent by using hands    Transfers  Able to transfer  safely, definite need of hands    Standing Unsupported with Eyes Closed  Able to stand 10 seconds safely    Standing Unsupported with Feet Together  Able to place feet together independently and stand 1 minute safely    From Standing, Reach Forward with Outstretched Arm  Can reach forward >12 cm safely (5")    From Standing Position, Pick up Object from Floor  Able to pick up shoe safely and easily    From Standing Position, Turn to Look Behind Over each Shoulder  Looks behind from both sides and weight shifts well    Turn 360 Degrees  Able to turn 360 degrees safely but slowly    Standing Unsupported, Alternately Place Feet on Step/Stool  Able to stand independently and complete 8 steps >20 seconds    Standing Unsupported, One Foot in [employer] balance while stepping or standing    Standing on One Leg  Unable to try or needs assist to prevent fall    Total Score  41      Timed Up and Go Test   TUG  Normal TUG    TUG Comments  complete next visit                  Objective measurements completed on examination: See above findings.              PT Education - 08/28/19 1212    Education Details  POC, HEP    Person(s) Educated  Patient  Methods  Explanation;Demonstration;Handout;Verbal cues    Comprehension  Verbalized understanding       PT Short Term Goals - 08/28/19 1238      PT SHORT TERM GOAL #1   Title  Pt will be I and compliant with initial HEP.    Time  1    Period  Weeks    Status  New    Target Date  09/04/19      PT SHORT TERM GOAL #2   Title  Pt will decrease 5XSST time to 15 seconds or less.    Baseline  17s    Time  3    Period  Weeks    Status  New    Target Date  09/18/19      PT SHORT TERM GOAL #3   Title  Pt will increase BERG score to at least 46/54.    Baseline  41/54    Time  3    Period  Weeks    Status  New    Target Date  09/18/19        PT Long Term Goals - 08/28/19 1240      PT LONG TERM GOAL #1   Title  Pt  will decrease 5XSST time to 12.6s or less, in order to decrease fall risk to age bracket norm.    Baseline  17s    Time  6    Period  Weeks    Status  New    Target Date  10/09/19      PT LONG TERM GOAL #2   Title  Pt will improve BERG score to >48/54, indicating significant improvement and decrease in fall risk.    Baseline  41/54    Time  6    Period  Weeks    Status  New    Target Date  10/09/19      PT LONG TERM GOAL #3   Title  Pt will perform TUG in 13.5 seconds or less to indicate decreased risk of falls.    Baseline  to be completed at 2nd visit    Time  6    Period  Weeks    Status  New    Target Date  10/09/19      PT LONG TERM GOAL #4   Title  Pt will perform 10 squats with good form and posture with no UE support.    Time  4    Period  Weeks    Status  New    Target Date  09/25/19             Plan - 08/28/19 1218    Clinical Impression Statement  Pt is a 74 yo with history of recurrent falls, LLE weakness from DVT 74 yo, on disability for the last 20 years 2 to [place] progressive weakness. Based on BERG balance test and 5XSST, pt is a high fall risk. He ambulates with SPC on L with R trendelenburg gait, poor foot clearance of LLE and overall forward flexed posture. Pt has significant limitations in L shoulder ROM and strength from past fall with subsequent humerus fracture, for which he reports receiving therapy for. Pt has impairments related to posture, flexibility, mobility, strength, endurance, and balance - all which lead to increased risk of falls. Pt was educated on POC and HEP, verbalizing understanding and consent to treatment. Pt will benefit from a skilled round of PT to progress strength and endurance for reduced falls risk and increased  independence long term.    Personal Factors and Comorbidities  Age;Sex;Finances;Comorbidity 1;Comorbidity 2;Comorbidity 3+;Past/Current Experience;Time since onset of injury/illness/exacerbation    Comorbidities   Arthritis, CHF, HTN, DVT, Dyslipidemia, Unstable Angina, CAD    Examination-Activity Limitations  Stairs;Locomotion Level;Reach Overhead;Lift;Bend;Squat;Transfers    Examination-Participation Restrictions  Community Activity;Cleaning;Shop;Yard Work    Merchant [employer] officer  Stable/Uncomplicated    Designer, jewellery  Low    Rehab Potential  Good    PT Frequency  2x / week    PT Duration  6 weeks    PT Treatment/Interventions  ADLs/Self Care Home Management;Moist Heat;Iontophoresis 4mg /ml Dexamethasone;Electrical Stimulation;Cryotherapy;Vasopneumatic Device;Passive range of motion;Dry needling;Energy conservation;Taping;Compression bandaging;Manual techniques;Patient/family education;Therapeutic exercise;Gait training;Stair training;Functional mobility training;Therapeutic activities;Balance training;Neuromuscular re-education    PT Next Visit Plan  Assess response to HEP, progress strength/endurance and balance, gait with and without SPC    PT Home Exercise Plan  see pt instructions    Consulted and Agree with Plan of Care  Patient       Patient will benefit from skilled therapeutic intervention in order to improve the following deficits and impairments:  Decreased activity tolerance, Decreased balance, Decreased endurance, Abnormal gait, Decreased mobility, Impaired flexibility, Decreased strength, Impaired UE functional use, Postural dysfunction, Pain, Difficulty walking, Cardiopulmonary status limiting activity, Impaired perceived functional ability, Improper body mechanics, Impaired tone  Visit Diagnosis: Muscle weakness (generalized) - Plan: PT plan of care cert/re-cert  Difficulty in walking, not elsewhere classified - Plan: PT plan of care cert/re-cert  Abnormal posture - Plan: PT plan of care cert/re-cert     Problem List Patient Active Problem List   Diagnosis Date Noted  . Hypokalemia   . OSA on CPAP   . Chest pain 08/11/2019  . Precordial chest pain   .  Unstable angina ([place]) 07/26/2018  . BPH with urinary obstruction 02/24/2018  . Renal hemorrhage, right 10/26/2017  . S/P left rotator cuff repair 03/05/2016  . Anterior dislocation of left shoulder 11/29/2015  . History of implantable cardioverter-defibrillator (ICD) placement 03/16/2015  . ICD (implantable cardioverter-defibrillator) in place   . Chronic systolic heart failure ([place]) 12/31/2014  . Chronic systolic dysfunction of left ventricle 11/27/2014  . S/P drug eluting coronary stent placement, mLAD 07/06/14, Promus 07/07/2014  . At risk for sudden cardiac death, EF 25-30% with PVCs, with lifevest 07/07/2014  . Coronary artery disease involving native coronary artery of native heart with unstable angina pectoris ([place])   . Cardiomyopathy, ischemic   . History of DVT (deep vein thrombosis), on coumadin   . NSTEMI (non-ST elevated myocardial infarction) ([place]) 07/05/2014  . CAD (coronary artery disease)   . Recurrent acute deep vein thrombosis (DVT) of lower extremity ([place])   . Dyslipidemia   . GERD (gastroesophageal reflux disease)   . Anterior myocardial infarction ([place])   . Lupus anticoagulant positive     Izell Galliano, PT, DPT 08/28/2019, 12:48 PM  [facility] [address]  [place], [place], [address] Phone: [phone]   Fax:  [phone]  Name: Kevin Hart MRN: [id number] Date of Birth: 02/13/1946

## 2019-08-29 ENCOUNTER — Ambulatory Visit: Payer: PPO

## 2019-08-29 ENCOUNTER — Encounter: Payer: Self-pay | Admitting: Medical

## 2019-08-29 ENCOUNTER — Ambulatory Visit (INDEPENDENT_AMBULATORY_CARE_PROVIDER_SITE_OTHER): Payer: PPO | Admitting: Medical

## 2019-08-29 VITALS — BP 94/66 | HR 81 | Temp 98.0°F | Ht 70.0 in | Wt 219.8 lb

## 2019-08-29 DIAGNOSIS — I2510 Atherosclerotic heart disease of native coronary artery without angina pectoris: Secondary | ICD-10-CM | POA: Diagnosis not present

## 2019-08-29 DIAGNOSIS — E785 Hyperlipidemia, unspecified: Secondary | ICD-10-CM

## 2019-08-29 DIAGNOSIS — I10 Essential (primary) hypertension: Secondary | ICD-10-CM

## 2019-08-29 DIAGNOSIS — Z79899 Other long term (current) drug therapy: Secondary | ICD-10-CM | POA: Diagnosis not present

## 2019-08-29 DIAGNOSIS — I5022 Chronic systolic (congestive) heart failure: Secondary | ICD-10-CM | POA: Diagnosis not present

## 2019-08-29 NOTE — Progress Notes (Signed)
Cardiology Office Note   Date:  09/03/2019   ID:  Kevin Hart, Kevin Hart 08/02/1945, MRN [id number]  [facility]  Cardiologist:  Peter Martinique, MD EP: Thompson Grayer, MD  Chief Complaint  Patient presents with  . Hospitalization Follow-up    recent chest pain      History of Present Illness: Kevin Hart is a 74 y.o. 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   He was recently admitted to the hospital from 08/11/19-08/15/19 after presenting with nitro responsive chest pain c/f unstable angina. He underwent a NST which showed a high risk study with finding c/w prior MI but no ischemia. He underwent a LHC 08/14/19 which showed 30% pRCA stenosis and 30-35% ISR of previously placed p-mLAD stent. He was recommended for ongoing medical management. He was continued on aspirin, statin, BBlocker (inderal due to tremors), and entresto. He was started on zetia for improved cholesterol management. Warfarin resumed prior to discharge with plans for an INR check in 3 days. He reports INR was "perfect" on post hospital check.  He presents today for post-hospital follow-up. He is doing well since discharge from the hospital. Only concern today is occasional palpitations, described as skipped beats. He was noted to have PACs/PVCs in the hospital. No associated dizziness, lightheadedness, or syncope. He notices them most when laying down. No complaints of chest pain, SOB, LE edema, orthopnea, or PND since discharge. BP is a little low today but this is often his norm. He is anticipating a spinal procedure next week for management of his scoliosis. Hopeful to spend the fall at [facility].      Past Medical History:  Diagnosis Date  . Arthritis   . Bladder stone 10/26/2017  . BPH (benign prostatic hyperplasia)    takes  Flomax daily  . CAD (coronary artery disease)    a. prior LAD stenting;  b. 06/2014 NSTEMI/PCI: LM nl, LAD 30p, 40m ISR (3.0x20 Promus DES), LCX nl, RCA nondom, nl, EF 25-30% ant, apical, dist inf AK.  . Cataracts, bilateral    immature  . Chronic systolic CHF (congestive heart failure) ([place]) 12/31/2014  . DVT (deep venous thrombosis) ([facility])    RECURRENT left leg  . Dyslipidemia   . GERD (gastroesophageal reflux disease)   . Gout   . History of kidney stones   . Hypertension   . Ischemic cardiomyopathy 07/06/2014  . Lupus anticoagulant positive   . Myocardial infarct ([place]) 2000  . Myocardial infarct ([place]) 2016  . S/P drug eluting coronary stent placement, 07/06/14, Promus 07/07/2014  . Sleep apnea    biPaP  . Subarachnoid bleed ([place]) 2015   after falling and hitting his head    Past Surgical History:  Procedure Laterality Date  . COLONOSCOPY    . EP IMPLANTABLE DEVICE [name] 11/27/2014   [employer] Fortify Assura VR  implanted by Dr Rayann Heman for primary prevention  . LEFT HEART CATH AND CORONARY ANGIOGRAPHY [name] 07/29/2018   Procedure: LEFT HEART CATH AND CORONARY ANGIOGRAPHY;  Surgeon: Sherren Mocha, MD;  Location: [facility];  Service: Cardiovascular;  Laterality: [name];  . LEFT HEART CATH AND CORONARY ANGIOGRAPHY [name] 08/14/2019   Procedure: LEFT HEART CATH AND CORONARY ANGIOGRAPHY;  Surgeon: Troy Sine, MD;  Location: [facility];  Service:  Cardiovascular;  Laterality: [name];  . LEFT HEART CATHETERIZATION WITH CORONARY ANGIOGRAM [name] 07/06/2014   Procedure: LEFT HEART CATHETERIZATION WITH CORONARY ANGIOGRAM;  Surgeon: Peter M Martinique, MD;  Location: [facility] CATH LAB;  Service: Cardiovascular;  Laterality: [name];  . PERCUTANEOUS CORONARY STENT INTERVENTION (PCI-S) Right 07/06/2014   Procedure: PERCUTANEOUS CORONARY STENT INTERVENTION (PCI-S);  Surgeon: Peter M Martinique, MD;  Location: [facility] CATH LAB;  Service: Cardiovascular;  Laterality: Right;  . SHOULDER ARTHROSCOPY WITH  SUBACROMIAL DECOMPRESSION Left 03/05/2016   Procedure: SHOULDER ARTHROSCOPY ROTATOR CUFF REPAIR WITH SUBACROMIAL DECOMPRESSION;  Surgeon: Tania Ade, MD;  Location: [facility];  Service: Orthopedics;  Laterality: Left;  SHOULDER ARTHROSCOPY ROTATOR CUFF REPAIR WITH SUBACROMIAL DECOMPRESSION  . SHOULDER CLOSED REDUCTION Left 11/29/2015   Procedure: CLOSED REDUCTION SHOULDER;  Surgeon: Tania Ade, MD;  Location: [facility];  Service: Orthopedics;  Laterality: Left;  . TRANSURETHRAL RESECTION OF PROSTATE [name] 02/24/2018   Procedure: TRANSURETHRAL RESECTION OF THE PROSTATE (TURP);  Surgeon: Irine Seal, MD;  Location: [facility];  Service: Urology;  Laterality: [name];  . WRIST SURGERY Bilateral    plates and screws      Current Outpatient Medications  Medication Sig Dispense Refill  . allopurinol (ZYLOPRIM) 300 MG tablet Take 300 mg by mouth daily.     Marland Kitchen aspirin 81 MG chewable tablet Chew 1 tablet (81 mg total) by mouth daily. 30 tablet 11  . ezetimibe (ZETIA) 10 MG tablet Take 1 tablet (10 mg total) by mouth daily. 30 tablet 11  . furosemide (LASIX) 40 MG tablet Take 40 mg by mouth daily.     Marland Kitchen LIVALO 4 MG TABS Take 1 tablet by mouth at bedtime.    . Multiple Vitamin (MULTI-VITAMINS) TABS Take 1 tablet by mouth daily.     . nitroGLYCERIN (NITROSTAT) 0.4 MG SL tablet Place 1 tablet (0.4 mg total) under the tongue every 5 (five) minutes x 3 doses as needed for chest pain. 25 tablet 11  . pantoprazole (PROTONIX) 40 MG tablet Take 1 tablet (40 mg total) by mouth daily. 30 tablet 2  . potassium chloride SA (KLOR-CON) 20 MEQ tablet Take 20 mEq by mouth daily.    . propranolol (INDERAL) 20 MG tablet Take 20 mg by mouth 2 (two) times daily.    . sacubitril-valsartan (ENTRESTO) 24-26 MG Take 1 tablet by mouth 2 (two) times daily.    Marland Kitchen warfarin (COUMADIN) 5 MG tablet Take 0.5-1 tablets (2.5-5 mg total) by mouth See admin instructions. Take 5mg  on 5/25 and 5/26 then resume home regimen of 2.5mg  daily except 5mg  on  Wednesday     No current facility-administered medications for this visit.    Allergies:   Patient has no known allergies.    Social History:  The patient  reports that he has never smoked. He has never used smokeless tobacco. He reports current alcohol use. He reports that he does not use drugs.   Family History:  The patient's family history includes Aneurysm in his father.    ROS:  Please see the history of present illness.   Otherwise, review of systems are positive for none.   All other systems are reviewed and negative.    PHYSICAL EXAM: VS:  BP 94/66   Pulse 81   Temp 98 F (36.7 C)   Ht 5\' 10"  (1.778 m)   Wt 219 lb 12.8 oz (99.7 kg)   SpO2 95%   BMI 31.54 kg/m  , BMI Body mass index is 31.54 kg/m. GEN: Well nourished,  well developed, in no acute distress HEENT: sclera anicteric Neck: no JVD, carotid bruits, or masses Cardiac: RRR; no murmurs, rubs, or gallops, no edema  Respiratory:  clear to auscultation bilaterally, normal work of breathing GI: soft, nontender, nondistended, + BS MS: no deformity or atrophy Skin: warm and dry, no rash Neuro:  Strength and sensation are intact Psych: euthymic mood, full affect   EKG:  EKG is not ordered today.    Recent Labs: 09/14/2018: ALT 21 08/11/2019: Magnesium 2.0; TSH 1.081 08/15/2019: BUN 15; Creatinine, Ser 1.03; Hemoglobin 13.8; Platelets 188; Potassium 4.2; Sodium 140    Lipid Panel    Component Value Date/Time   CHOL 169 08/12/2019 0423   CHOL 133 09/14/2018 0837   TRIG 118 08/12/2019 0423   HDL 39 (L) 08/12/2019 0423   HDL 49 09/14/2018 0837   CHOLHDL 4.3 08/12/2019 0423   VLDL 24 08/12/2019 0423   LDLCALC 106 (H) 08/12/2019 0423   LDLCALC 59 09/14/2018 0837      Wt Readings from Last 3 Encounters:  08/29/19 219 lb 12.8 oz (99.7 kg)  08/15/19 221 lb (100.2 kg)  06/22/19 224 lb (101.6 kg)      Other studies Reviewed: Additional studies/ records that were reviewed today include:   LEFT HEART  CATH AND CORONARY ANGIOGRAPHY 08/14/19  Conclusion    Prox RCA lesion is 30% stenosed.  Prox LAD to Mid LAD lesion is 35% stenosed.  The Left main is a normal vessel and trifurcates into a large LAD, ramus immediate vessel and dominant left circumflex coronary artery. The LAD stent remains patent and has residual in-stent narrowing of 30 to 35% not significantly changed from the prior catheterization. The ramus intermediate and dominant circumflex are angiographically normal. The RCA is a small nondominant vessel with smooth 30% proximal stenosis.  LVEDP 5 mmHg.  RECOMMENDATION: Medical therapy. Reinitiate warfarin therapy. GDMT therapy for his reduced LV function.   Diagnostic Dominance: Left  Intervention    Echo 08/12/19 1. Since the last study on 08/18/2017 LVEf has further decreased from  35-40% to 25-30% with diffuse hypokinesis and apical aneurysmal  dilatation.  2. Left ventricular ejection fraction, by estimation, is 25 to 30%. The  left ventricle has severely decreased function. The left ventricle has no  regional wall motion abnormalities. The left ventricular internal cavity  size was mildly dilated. There is  moderate concentric left ventricular hypertrophy. Left ventricular  diastolic parameters are consistent with Grade I diastolic dysfunction  (impaired relaxation). Elevated left atrial pressure.  3. Right ventricular systolic function is normal. The right ventricular  size is normal. A ICD wire is visualized. There is normal pulmonary artery  systolic pressure. The estimated right ventricular systolic pressure is  36.6 mmHg.  4. The mitral valve is normal in structure. Mild mitral valve  regurgitation. No evidence of mitral stenosis.  5. The aortic valve is normal in structure. Aortic valve regurgitation is  mild. Mild to moderate aortic valve sclerosis/calcification is present,  without any evidence of aortic stenosis.  6. The inferior vena  cava is normal in size with greater than 50%  respiratory variability, suggesting right atrial pressure of 3 mmHg.       ASSESSMENT AND PLAN:  1. CAD s/p PCI to LAD: no anginal complaints - Continue aspirin, statin, and zetia - Continue BBlocker  2. Chronic combined CHF: EF 25-30% during his recent admission, down from 35-40% previously. No volume overload complaints and he appears euvolemic on exam. Low blood pressures  limits addition of spironolactone - Continue inderal, entresto, and lasix  3. HTN: BP 94/62 today; this is his baseline. No complaints of dizziness, lightheadedness, or syncope - Managed in the context of #2  4. HLD: he is intolerant to statins. Again stopped crestor after discharge due to myalgias. He is on zetia and livalo currently  - Will have him return for FLP in 2 months - if LDL is not at goal of <70, will refer to the lipid clinic.  - Continue zetia and lovaza    Current medicines are reviewed at length with the patient today.  The patient does not have concerns regarding medicines.  The following changes have been made:  As above  Labs/ tests ordered today include:   Orders Placed This Encounter  Procedures  . Hepatic function panel     Disposition:   FU with Dr. Martinique in 3 months  Signed, Abigail Butts, PA-C  09/03/2019 10:34 PM

## 2019-08-29 NOTE — Patient Instructions (Signed)
Medication Instructions:  Continue current medications  *If you need a refill on your cardiac medications before your next appointment, please call your pharmacy*   Lab Work: Liver function test  If you have labs (blood work) drawn today and your tests are completely normal, you will receive your results only by:  [place] (if you have MyChart) OR  A paper copy in the mail If you have any lab test that is abnormal or we need to change your treatment, we will call you to review the results.   Testing/Procedures: None Ordered   Follow-Up: At [facility], you and your health needs are our priority.  As part of our continuing mission to provide you with exceptional heart care, we have created designated Provider Care Teams.  These Care Teams include your primary Cardiologist (physician) and Advanced Practice Providers (APPs -  Physician Assistants and Nurse Practitioners) who all work together to provide you with the care you need, when you need it.  We recommend signing up for the patient portal called "MyChart".  Sign up information is provided on this After Visit Summary.  MyChart is used to connect with patients for Virtual Visits (Telemedicine).  Patients are able to view lab/test results, encounter notes, upcoming appointments, etc.  Non-urgent messages can be sent to your provider as well.   To learn more about what you can do with MyChart, go to [email or web address].    Your next appointment:   3 month(s)  The format for your next appointment:   In Person  Provider:   You may see Peter Martinique, MD or one of the following Advanced Practice Providers on your designated Care Team:    Almyra Deforest, PA-C  Fabian Sharp, PA-C or   Roby Lofts, [place]

## 2019-09-03 ENCOUNTER — Encounter: Payer: Self-pay | Admitting: Medical

## 2019-09-04 ENCOUNTER — Ambulatory Visit (INDEPENDENT_AMBULATORY_CARE_PROVIDER_SITE_OTHER): Payer: PPO

## 2019-09-04 DIAGNOSIS — Z95810 Presence of automatic (implantable) cardiac defibrillator: Secondary | ICD-10-CM | POA: Diagnosis not present

## 2019-09-04 DIAGNOSIS — I5022 Chronic systolic (congestive) heart failure: Secondary | ICD-10-CM

## 2019-09-04 NOTE — Progress Notes (Signed)
EPIC Encounter for ICM Monitoring  Patient Name: Kevin Hart is a 74 y.o. male Date: 09/04/2019 Primary Care Physican: [facility] [place] Electrophysiologist: Allred 08/02/2019 Weight:217 lbs   Spoke with patient.  He reports he could tell he had some fluid but feels like the fluid issue has resolved because he had increase in urine output last night and this morning.   CorvueThoracic impedance suggesting possible fluid accumulation starting 08/29/2019.  Prescribed:Furosemide 40 mg 1 tablet daily.  Labs: 08/15/2019 Creatinine 1.03, BUN 15, Potassium 4.2, Sodium 140, GFR >60 08/14/2019 Creatinine 1.04, BUN 14, Potassium 4.3, Sodium 140, GFR >60  08/12/2019 Creatinine 0.99, BUN 14, Potassium 3.3, Sodium 143, GFR >60  08/11/2019 Creatinine 1.08, BUN 13, Potassium 3.8, Sodium 139, GFR >60  A complete set of results can be found in Results Review.  Recommendations: Advised patient to take extra Furosemide if needed since report suggesting still has possible fluid accumulation.  He thinks it has resolved but will take it if he feels he needs to.  Follow-up plan: ICM clinic phone appointment on6/22/2021 to recheck fluid levels. 91 day device clinic remote transmission7/08/2019.  Copy of ICM check sent to Dr.Allred and Dr Martinique.  3 month ICM trend: 09/04/2019    1 Year ICM trend:       Rosalene Billings, RN 09/04/2019 11:04 AM

## 2019-09-06 ENCOUNTER — Ambulatory Visit: Payer: PPO | Admitting: Physical Therapy

## 2019-09-06 ENCOUNTER — Encounter: Payer: Self-pay | Admitting: Physical Therapy

## 2019-09-06 ENCOUNTER — Other Ambulatory Visit: Payer: Self-pay

## 2019-09-06 VITALS — BP 104/62 | HR 86

## 2019-09-06 DIAGNOSIS — M6281 Muscle weakness (generalized): Secondary | ICD-10-CM | POA: Diagnosis not present

## 2019-09-06 DIAGNOSIS — R262 Difficulty in walking, not elsewhere classified: Secondary | ICD-10-CM

## 2019-09-06 DIAGNOSIS — R293 Abnormal posture: Secondary | ICD-10-CM

## 2019-09-06 NOTE — Therapy (Signed)
[facility] [address]  [place], [place], [address] Phone: [phone]   Fax:  [phone]  Physical Therapy Treatment  Patient Details  Name: Kevin Hart MRN: [id number] Date of Birth: 25-Dec-1945 Referring Provider (PT): Leanor Kail, [place]   Encounter Date: 09/06/2019   PT End of Session - 09/06/19 1200    Visit Number 2    Number of Visits 12    Date for PT Re-Evaluation 10/09/19    Authorization Type [employer]    PT Start Time 1015    PT Stop Time 1057    PT Time Calculation (min) 42 min    Activity Tolerance Patient tolerated treatment well;Patient limited by pain    Behavior During Therapy [facility] for tasks assessed/performed           Past Medical History:  Diagnosis Date  . Arthritis   . Bladder stone 10/26/2017  . BPH (benign prostatic hyperplasia)    takes Flomax daily  . CAD (coronary artery disease)    a. prior LAD stenting;  b. 06/2014 NSTEMI/PCI: LM nl, LAD 30p, 28m ISR (3.0x20 Promus DES), LCX nl, RCA nondom, nl, EF 25-30% ant, apical, dist inf [place].  . Cataracts, bilateral    immature  . Chronic systolic CHF (congestive heart failure) ([place]) 12/31/2014  . DVT (deep venous thrombosis) (H[facility])    RECURRENT left leg  . Dyslipidemia   . GERD (gastroesophageal reflux disease)   . Gout   . History of kidney stones   . Hypertension   . Ischemic cardiomyopathy 07/06/2014  . Lupus anticoagulant positive   . Myocardial infarct ([place]) 2000  . Myocardial infarct ([place]) 2016  . S/P drug eluting coronary stent placement, 07/06/14, Promus 07/07/2014  . Sleep apnea    biPaP  . Subarachnoid bleed ([place]) 2015   after falling and hitting his head    Past Surgical History:  Procedure Laterality Date  . COLONOSCOPY    . EP IMPLANTABLE DEVICE [name] 11/27/2014   St. Jude Medical Fortify Assura VR  implanted by Dr Rayann Heman for primary prevention  . LEFT HEART CATH AND CORONARY ANGIOGRAPHY [name]  07/29/2018   Procedure: LEFT HEART CATH AND CORONARY ANGIOGRAPHY;  Surgeon: Sherren Mocha, MD;  Location: [place] [facility];  Service: Cardiovascular;  Laterality: [name];  . LEFT HEART CATH AND CORONARY ANGIOGRAPHY [name] 08/14/2019   Procedure: LEFT HEART CATH AND CORONARY ANGIOGRAPHY;  Surgeon: Troy Sine, MD;  Location: [place] [facility];  Service: Cardiovascular;  Laterality: [name];  . LEFT HEART CATHETERIZATION WITH CORONARY ANGIOGRAM [name] 07/06/2014   Procedure: LEFT HEART CATHETERIZATION WITH CORONARY ANGIOGRAM;  Surgeon: Peter M Martinique, MD;  Location: [facility] CATH LAB;  Service: Cardiovascular;  Laterality: [name];  . PERCUTANEOUS CORONARY STENT INTERVENTION (PCI-S) Right 07/06/2014   Procedure: PERCUTANEOUS CORONARY STENT INTERVENTION (PCI-S);  Surgeon: Peter M Martinique, MD;  Location: [facility] CATH LAB;  Service: Cardiovascular;  Laterality: Right;  . SHOULDER ARTHROSCOPY WITH SUBACROMIAL DECOMPRESSION Left 03/05/2016   Procedure: SHOULDER ARTHROSCOPY ROTATOR CUFF REPAIR WITH SUBACROMIAL DECOMPRESSION;  Surgeon: Tania Ade, MD;  Location: [place];  Service: Orthopedics;  Laterality: Left;  SHOULDER ARTHROSCOPY ROTATOR CUFF REPAIR WITH SUBACROMIAL DECOMPRESSION  . SHOULDER CLOSED REDUCTION Left 11/29/2015   Procedure: CLOSED REDUCTION SHOULDER;  Surgeon: Tania Ade, MD;  Location: [facility];  Service: Orthopedics;  Laterality: Left;  . TRANSURETHRAL RESECTION OF PROSTATE [name] 02/24/2018   Procedure: TRANSURETHRAL RESECTION OF THE PROSTATE (TURP);  Surgeon: Irine Seal, MD;  Location: [facility];  Service: Urology;  Laterality: [name];  . WRIST SURGERY Bilateral    plates and screws     Vitals:   09/06/19 1017 09/06/19 1027  BP: 104/62   Pulse: 87 86  SpO2: 98% 97%     Subjective Assessment - 09/06/19 1021    Subjective Exercises were not that hard. Has been performing HEP daily. Denies falls since last session.    Pertinent History DVT LLE 20 years ago, unstable angina (hospitalized 2 weeks ago), 2 MI's,  scoliosis    Patient Stated Goals Pt would like to get better at getting up off of the floor and be steadier when he is walking.    Currently in Pain? No/denies              [facility] PT Assessment - 09/06/19 0001      Timed Up and Go Test   TUG Normal TUG    TUG Comments 11.85 sec without AD, 11.65 sec with SPC                         [facility] Adult PT Treatment/Exercise - 09/06/19 0001      Neuro Re-ed    Neuro Re-ed Details  alt toe tap on 9" step 2x20 with 2 fingers support on counter;  lateral stepping over beanbag with 2 fingers support x10;       Exercises   Exercises Knee/Hip      Knee/Hip Exercises: Aerobic   Nustep L4 x 6 min (UEs/LEs)      Knee/Hip Exercises: Standing   Heel Raises Both;1 set;10 reps    Heel Raises Limitations at counter; cues to maintain straight knees    Knee Flexion AROM;Both;1 set;20 reps    Knee Flexion Limitations high knee marching at counter   cues to maintain upright posture   Functional Squat 1 set;10 reps    Functional Squat Limitations cues to avoid deep squat and promote hip extension      Knee/Hip Exercises: Seated   Ball Squeeze 5x10"     Clamshell with Thera[employer] Green   x10   Sit to Sand 5 reps;without UE support;2 sets   holding ball at chest                 PT Education - 09/06/19 1200    Education Details update to HEP; omitted standing marching    Person(s) Educated Patient    Methods Explanation;Demonstration;Tactile cues;Verbal cues;Handout    Comprehension Verbalized understanding;Returned demonstration            PT Short Term Goals - 09/06/19 1203      PT SHORT TERM GOAL #1   Title Pt will be I and compliant with initial HEP.    Time 1    Period Weeks    Status Achieved    Target Date 09/04/19      PT SHORT TERM GOAL #2   Title Pt will decrease 5XSST time to 15 seconds or less.    Baseline 17s    Time 3    Period Weeks    Status On-going    Target Date 09/18/19      PT SHORT TERM  GOAL #3   Title Pt will increase BERG score to at least 46/54.    Baseline 41/54    Time 3    Period Weeks    Status On-going    Target Date 09/18/19  PT Long Term Goals - 09/06/19 1203      PT LONG TERM GOAL #1   Title Pt will decrease 5XSST time to 12.6s or less, in order to decrease fall risk to age bracket norm.    Baseline 17s    Time 6    Period Weeks    Status On-going      PT LONG TERM GOAL #2   Title Pt will improve BERG score to >48/54, indicating significant improvement and decrease in fall risk.    Baseline 41/54    Time 6    Period Weeks    Status On-going      PT LONG TERM GOAL #3   Title Pt will perform TUG in 13.5 seconds or less to indicate decreased risk of falls.    Baseline to be completed at 2nd visit    Time 6    Period Weeks    Status Achieved      PT LONG TERM GOAL #4   Title Pt will perform 10 squats with good form and posture with no UE support.    Time 4    Period Weeks    Status On-going                 Plan - 09/06/19 1201    Clinical Impression Statement Patient without new complaints today. Noting compliance with HEP and denied questions. TUG was assessed today, and patient's score indicates a decreased risk of falls according to gait speed with and without AD. Cues provided to avoid excessively deep squat and promote hip extension with mini squats. Cueing also required to maintain knee extension with heel raises, with good carryover. Intermittent sitting rest breaks required in between standing exercises today d/t patient's c/o LBP. Patient noting difficulty transferring from a chair, thus worked on STS transfers. Patient with good knowledge of proper set up of transfer, and able to perform transfers without UE support. Updated HEP with exercises that were well-tolerated today. Patient reported understanding and without complaints at end of session.    Comorbidities Arthritis, CHF, HTN, DVT, Dyslipidemia, Unstable Angina,  CAD    PT Treatment/Interventions ADLs/Self Care Home Management;Moist Heat;Iontophoresis 4mg /ml Dexamethasone;Electrical Stimulation;Cryotherapy;Vasopneumatic Device;Passive range of motion;Dry needling;Energy conservation;Taping;Compression bandaging;Manual techniques;Patient/family education;Therapeutic exercise;Gait training;Stair training;Functional mobility training;Therapeutic activities;Balance training;Neuromuscular re-education    PT Next Visit Plan progress strength/endurance and balance, gait with and without SPC    Consulted and Agree with Plan of Care Patient           Patient will benefit from skilled therapeutic intervention in order to improve the following deficits and impairments:  Decreased activity tolerance, Decreased balance, Decreased endurance, Abnormal gait, Decreased mobility, Impaired flexibility, Decreased strength, Impaired UE functional use, Postural dysfunction, Pain, Difficulty walking, Cardiopulmonary status limiting activity, Impaired perceived functional ability, Improper body mechanics, Impaired tone  Visit Diagnosis: Muscle weakness (generalized)  Difficulty in walking, not elsewhere classified  Abnormal posture     Problem List Patient Active Problem List   Diagnosis Date Noted  . Hypokalemia   . OSA on CPAP   . Chest pain 08/11/2019  . Precordial chest pain   . Unstable angina ([place]) 07/26/2018  . BPH with urinary obstruction 02/24/2018  . Renal hemorrhage, right 10/26/2017  . S/P left rotator cuff repair 03/05/2016  . Anterior dislocation of left shoulder 11/29/2015  . History of implantable cardioverter-defibrillator (ICD) placement 03/16/2015  . ICD (implantable cardioverter-defibrillator) in place   . Chronic systolic heart failure ([place]) 12/31/2014  . Chronic systolic  dysfunction of left ventricle 11/27/2014  . S/P drug eluting coronary stent placement, mLAD 07/06/14, Promus 07/07/2014  . At risk for sudden cardiac death, EF 25-30% with  PVCs, with lifevest 07/07/2014  . Coronary artery disease involving native coronary artery of native heart with unstable angina pectoris ([place])   . Cardiomyopathy, ischemic   . History of DVT (deep vein thrombosis), on coumadin   . NSTEMI (non-ST elevated myocardial infarction) ([place]) 07/05/2014  . CAD (coronary artery disease)   . Recurrent acute deep vein thrombosis (DVT) of lower extremity ([place])   . Dyslipidemia   . GERD (gastroesophageal reflux disease)   . Anterior myocardial infarction ([place])   . Lupus anticoagulant positive     Janene Harvey, PT, DPT 09/06/19 12:05 PM   [facility] [address]  [place], [place], [address] Phone: [phone]   Fax:  [phone]  Name: Kevin Hart MRN: [id number] Date of Birth: [birth year]

## 2019-09-07 DIAGNOSIS — M4180 Other forms of scoliosis, site unspecified: Secondary | ICD-10-CM | POA: Diagnosis not present

## 2019-09-07 DIAGNOSIS — M48062 Spinal stenosis, lumbar region with neurogenic claudication: Secondary | ICD-10-CM | POA: Diagnosis not present

## 2019-09-12 ENCOUNTER — Telehealth: Payer: Self-pay

## 2019-09-12 ENCOUNTER — Ambulatory Visit (INDEPENDENT_AMBULATORY_CARE_PROVIDER_SITE_OTHER): Payer: PPO

## 2019-09-12 DIAGNOSIS — I5022 Chronic systolic (congestive) heart failure: Secondary | ICD-10-CM

## 2019-09-12 DIAGNOSIS — Z95810 Presence of automatic (implantable) cardiac defibrillator: Secondary | ICD-10-CM

## 2019-09-12 NOTE — Progress Notes (Signed)
EPIC Encounter for ICM Monitoring  Patient Name: Kevin Hart is a 74 y.o. male Date: 09/12/2019 Primary Care Physican: [facility] [place] [facility] [place] Electrophysiologist: Allred 5/12/2021Weight:217lbs   Attempted call to patient and unable to reach.  Left detailed message per DPR regarding transmission. Transmission reviewed.   CorvueThoracic impedance returned to normal.  Prescribed:Furosemide 40 mg 1 tablet daily.  Labs: 08/15/2019 Creatinine 1.03, BUN 15, Potassium 4.2, Sodium 140, GFR >60 08/14/2019 Creatinine 1.04, BUN 14, Potassium 4.3, Sodium 140, GFR >60  08/12/2019 Creatinine 0.99, BUN 14, Potassium 3.3, Sodium 143, GFR >60  08/11/2019 Creatinine 1.08, BUN 13, Potassium 3.8, Sodium 139, GFR >60  A complete set of results can be found in Results Review.  Recommendations:Left voice mail with ICM number and encouraged to call if experiencing any fluid symptoms.  Follow-up plan: ICM clinic phone appointment on7/26/2021. 91 day device clinic remote transmission7/08/2019.  Copy of ICM check sent to Dr.Allred  3 month ICM trend: 09/12/2019    1 Year ICM trend:       Rosalene Billings, RN 09/12/2019 9:55 AM

## 2019-09-12 NOTE — Telephone Encounter (Signed)
Remote ICM transmission received.  Attempted call to patient regarding ICM remote transmission and left detailed message per DPR.  Advised to return call for any fluid symptoms or questions. Next ICM remote transmission scheduled 10/16/2019.   ° ° °

## 2019-09-13 ENCOUNTER — Ambulatory Visit: Payer: PPO | Admitting: Physical Therapy

## 2019-09-13 ENCOUNTER — Other Ambulatory Visit: Payer: Self-pay

## 2019-09-13 ENCOUNTER — Encounter: Payer: Self-pay | Admitting: Physical Therapy

## 2019-09-13 DIAGNOSIS — M6281 Muscle weakness (generalized): Secondary | ICD-10-CM

## 2019-09-13 DIAGNOSIS — R262 Difficulty in walking, not elsewhere classified: Secondary | ICD-10-CM

## 2019-09-13 DIAGNOSIS — R293 Abnormal posture: Secondary | ICD-10-CM

## 2019-09-13 NOTE — Therapy (Signed)
[facility] [address]  [place], [place], [address] Phone: [phone]   Fax:  [phone]  Physical Therapy Treatment  Patient Details  Name: Kevin Hart MRN: [id number] Date of Birth: Mar 03, 1946 Referring Provider (PT): Leanor Kail, [place]   Encounter Date: 09/13/2019   PT End of Session - 09/13/19 6761    Visit Number 3    Number of Visits 12    Date for PT Re-Evaluation 10/09/19    Authorization Type [employer]    PT Start Time 0848    PT Stop Time 0927    PT Time Calculation (min) 39 min    Activity Tolerance Patient tolerated treatment well    Behavior During Therapy [facility] for tasks assessed/performed           Past Medical History:  Diagnosis Date  . Arthritis   . Bladder stone 10/26/2017  . BPH (benign prostatic hyperplasia)    takes Flomax daily  . CAD (coronary artery disease)    a. prior LAD stenting;  b. 06/2014 NSTEMI/PCI: LM nl, LAD 30p, 83m ISR (3.0x20 Promus DES), LCX nl, RCA nondom, nl, EF 25-30% ant, apical, dist inf [place].  . Cataracts, bilateral    immature  . Chronic systolic CHF (congestive heart failure) ([place]) 12/31/2014  . DVT (deep venous thrombosis) (HCC)    RECURRENT left leg  . Dyslipidemia   . GERD (gastroesophageal reflux disease)   . Gout   . History of kidney stones   . Hypertension   . Ischemic cardiomyopathy 07/06/2014  . Lupus anticoagulant positive   . Myocardial infarct ([place]) 2000  . Myocardial infarct ([place]) 2016  . S/P drug eluting coronary stent placement, 07/06/14, Promus 07/07/2014  . Sleep apnea    biPaP  . Subarachnoid bleed ([place]) 2015   after falling and hitting his head    Past Surgical History:  Procedure Laterality Date  . COLONOSCOPY    . EP IMPLANTABLE DEVICE [name] 11/27/2014   St. Jude Medical Fortify Assura VR  implanted by Dr Rayann Heman for primary prevention  . LEFT HEART CATH AND CORONARY ANGIOGRAPHY [name] 07/29/2018   Procedure: LEFT  HEART CATH AND CORONARY ANGIOGRAPHY;  Surgeon: Sherren Mocha, MD;  Location: [place] CV [facility];  Service: Cardiovascular;  Laterality: [name];  . LEFT HEART CATH AND CORONARY ANGIOGRAPHY [name] 08/14/2019   Procedure: LEFT HEART CATH AND CORONARY ANGIOGRAPHY;  Surgeon: Troy Sine, MD;  Location: [place] [facility];  Service: Cardiovascular;  Laterality: [name];  . LEFT HEART CATHETERIZATION WITH CORONARY ANGIOGRAM [name] 07/06/2014   Procedure: LEFT HEART CATHETERIZATION WITH CORONARY ANGIOGRAM;  Surgeon: Peter M Martinique, MD;  Location: [facility] CATH LAB;  Service: Cardiovascular;  Laterality: [name];  . PERCUTANEOUS CORONARY STENT INTERVENTION (PCI-S) Right 07/06/2014   Procedure: PERCUTANEOUS CORONARY STENT INTERVENTION (PCI-S);  Surgeon: Peter M Martinique, MD;  Location: [facility] CATH LAB;  Service: Cardiovascular;  Laterality: Right;  . SHOULDER ARTHROSCOPY WITH SUBACROMIAL DECOMPRESSION Left 03/05/2016   Procedure: SHOULDER ARTHROSCOPY ROTATOR CUFF REPAIR WITH SUBACROMIAL DECOMPRESSION;  Surgeon: Tania Ade, MD;  Location: [facility];  Service: Orthopedics;  Laterality: Left;  SHOULDER ARTHROSCOPY ROTATOR CUFF REPAIR WITH SUBACROMIAL DECOMPRESSION  . SHOULDER CLOSED REDUCTION Left 11/29/2015   Procedure: CLOSED REDUCTION SHOULDER;  Surgeon: Tania Ade, MD;  Location: [place];  Service: Orthopedics;  Laterality: Left;  . TRANSURETHRAL RESECTION OF PROSTATE [name] 02/24/2018   Procedure: TRANSURETHRAL RESECTION OF THE PROSTATE (TURP);  Surgeon: Irine Seal, MD;  Location: [facility];  Service: Urology;  Laterality: [name];  . WRIST SURGERY Bilateral    plates and screws     There were no vitals filed for this visit.   Subjective Assessment - 09/13/19 0851    Subjective Doing well. No new concerns. Reports compliance with HEP 6/7 days of the week.    Pertinent History DVT LLE 20 years ago, unstable angina (hospitalized 2 weeks ago), 2 MI's, scoliosis    Patient Stated Goals Pt would like to get better at getting up off of the  floor and be steadier when he is walking.    Currently in Pain? No/denies                             [facility] Adult PT Treatment/Exercise - 09/13/19 0001      Bed Mobility   Bed Mobility Rolling Right;Right Sidelying to Sit   cueing to reach across the boddy and assist pushing up with    Rolling Right Supervision/verbal cueing    Right Sidelying to Sit Supervision/Verbal cueing      Knee/Hip Exercises: Aerobic   Nustep L4 x 6 min (UEs/LEs)      Knee/Hip Exercises: Standing   Hip Flexion Stengthening;Both;2 sets;10 reps;Knee bent    Hip Flexion Limitations yellow band around toes    Wall Squat 1 set;10 reps    Wall Squat Limitations depth to tolerance   cues to fully extend knees      Knee/Hip Exercises: Seated   Sit to Sand 2 sets;without UE support   holding ball at chest; red TB above knees     Knee/Hip Exercises: Supine   Bridges Strengthening;Both;1 set;10 reps    Bridges Limitations good form    Bridges with Clamshell Strengthening;Both;1 set;10 reps   red TB above knees   Other Supine Knee/Hip Exercises B isometric hip flexion with LEs on orange pball 5x10"   cues for rhythmic breathing     Knee/Hip Exercises: Sidelying   Clams 2x10 each LE   cueing for positioning                   PT Short Term Goals - 09/06/19 1203      PT SHORT TERM GOAL #1   Title Pt will be I and compliant with initial HEP.    Time 1    Period Weeks    Status Achieved    Target Date 09/04/19      PT SHORT TERM GOAL #2   Title Pt will decrease 5XSST time to 15 seconds or less.    Baseline 17s    Time 3    Period Weeks    Status On-going    Target Date 09/18/19      PT SHORT TERM GOAL #3   Title Pt will increase BERG score to at least 46/54.    Baseline 41/54    Time 3    Period Weeks    Status On-going    Target Date 09/18/19             PT Long Term Goals - 09/06/19 1203      PT LONG TERM GOAL #1   Title Pt will decrease 5XSST time to 12.6s or  less, in order to decrease fall risk to age bracket norm.    Baseline 17s    Time 6    Period Weeks    Status On-going      PT LONG  TERM GOAL #2   Title Pt will improve BERG score to >48/54, indicating significant improvement and decrease in fall risk.    Baseline 41/54    Time 6    Period Weeks    Status On-going      PT LONG TERM GOAL #3   Title Pt will perform TUG in 13.5 seconds or less to indicate decreased risk of falls.    Baseline to be completed at 2nd visit    Time 6    Period Weeks    Status Achieved      PT LONG TERM GOAL #4   Title Pt will perform 10 squats with good form and posture with no UE support.    Time 4    Period Weeks    Status On-going                 Plan - 09/13/19 3748    Clinical Impression Statement Patient without new complaints today and reporting compliance with HEP. Worked on progressing hip strength with STS with patient tolerating this exercise well. Did require cues to fully extend B knees and hips upon standing, with good carryover. Initiated glute strengthening with bridges, with patient demonstrating good hip extension. Patient remarking on difficulty sitting up from supine, thus reviewed bed mobility with cueing to reach across the body and assist pushing up with one elbow and the opposite hand. Patient tolerated all mat strengthening ther-ex without complaints. Cueing for upright posture required with initiation of resisted marching today, but with good tolerance. Patient demonstrating improvement in exercise tolerance this session. No complaints at end of session.    Comorbidities Arthritis, CHF, HTN, DVT, Dyslipidemia, Unstable Angina, CAD    PT Treatment/Interventions ADLs/Self Care Home Management;Moist Heat;Iontophoresis 4mg /ml Dexamethasone;Electrical Stimulation;Cryotherapy;Vasopneumatic Device;Passive range of motion;Dry needling;Energy conservation;Taping;Compression bandaging;Manual techniques;Patient/family  education;Therapeutic exercise;Gait training;Stair training;Functional mobility training;Therapeutic activities;Balance training;Neuromuscular re-education    PT Next Visit Plan progress strength/endurance and balance, gait with and without SPC    Consulted and Agree with Plan of Care Patient           Patient will benefit from skilled therapeutic intervention in order to improve the following deficits and impairments:  Decreased activity tolerance, Decreased balance, Decreased endurance, Abnormal gait, Decreased mobility, Impaired flexibility, Decreased strength, Impaired UE functional use, Postural dysfunction, Pain, Difficulty walking, Cardiopulmonary status limiting activity, Impaired perceived functional ability, Improper body mechanics, Impaired tone  Visit Diagnosis: Muscle weakness (generalized)  Difficulty in walking, not elsewhere classified  Abnormal posture     Problem List Patient Active Problem List   Diagnosis Date Noted  . Hypokalemia   . OSA on CPAP   . Chest pain 08/11/2019  . Precordial chest pain   . Unstable angina ([place]) 07/26/2018  . BPH with urinary obstruction 02/24/2018  . Renal hemorrhage, right 10/26/2017  . S/P left rotator cuff repair 03/05/2016  . Anterior dislocation of left shoulder 11/29/2015  . History of implantable cardioverter-defibrillator (ICD) placement 03/16/2015  . ICD (implantable cardioverter-defibrillator) in place   . Chronic systolic heart failure ([place]) 12/31/2014  . Chronic systolic dysfunction of left ventricle 11/27/2014  . S/P drug eluting coronary stent placement, mLAD 07/06/14, Promus 07/07/2014  . At risk for sudden cardiac death, EF 25-30% with PVCs, with lifevest 07/07/2014  . Coronary artery disease involving native coronary artery of native heart with unstable angina pectoris ([place])   . Cardiomyopathy, ischemic   . History of DVT (deep vein thrombosis), on coumadin   . NSTEMI (non-ST elevated myocardial  infarction) ([place])  07/05/2014  . CAD (coronary artery disease)   . Recurrent acute deep vein thrombosis (DVT) of lower extremity ([place])   . Dyslipidemia   . GERD (gastroesophageal reflux disease)   . Anterior myocardial infarction ([place])   . Lupus anticoagulant positive      Janene Harvey, PT, DPT 09/13/19 9:30 AM   [facility] [address]  [place], [place], [address] Phone: [phone]   Fax:  [phone]  Name: Kevin Hart MRN: [id number] Date of Birth: 14-May-1945

## 2019-09-18 ENCOUNTER — Ambulatory Visit: Payer: PPO | Admitting: Physical Therapy

## 2019-09-18 ENCOUNTER — Other Ambulatory Visit: Payer: Self-pay

## 2019-09-18 ENCOUNTER — Encounter: Payer: Self-pay | Admitting: Physical Therapy

## 2019-09-18 DIAGNOSIS — M6281 Muscle weakness (generalized): Secondary | ICD-10-CM | POA: Diagnosis not present

## 2019-09-18 DIAGNOSIS — R293 Abnormal posture: Secondary | ICD-10-CM

## 2019-09-18 DIAGNOSIS — R262 Difficulty in walking, not elsewhere classified: Secondary | ICD-10-CM

## 2019-09-18 NOTE — Therapy (Signed)
[facility] [address]  [place], [place], [address] Phone: [phone]   Fax:  [phone]  Physical Therapy Treatment  Patient Details  Name: Kevin Hart MRN: [id number] Date of Birth: 10/15/1945 Referring Provider (PT): Leanor Kail, [place]   Encounter Date: 09/18/2019   PT End of Session - 09/18/19 1439    Visit Number 4    Number of Visits 12    Date for PT Re-Evaluation 10/09/19    Authorization Type [employer]    PT Start Time 1440    PT Stop Time 1538    PT Time Calculation (min) 58 min    Activity Tolerance Patient tolerated treatment well    Behavior During Therapy [facility] for tasks assessed/performed           Past Medical History:  Diagnosis Date  . Arthritis   . Bladder stone 10/26/2017  . BPH (benign prostatic hyperplasia)    takes Flomax daily  . CAD (coronary artery disease)    a. prior LAD stenting;  b. 06/2014 NSTEMI/PCI: LM nl, LAD 30p, 59m ISR (3.0x20 Promus DES), LCX nl, RCA nondom, nl, EF 25-30% ant, apical, dist inf [place].  . Cataracts, bilateral    immature  . Chronic systolic CHF (congestive heart failure) ([place]) 12/31/2014  . DVT (deep venous thrombosis) (HCC)    RECURRENT left leg  . Dyslipidemia   . GERD (gastroesophageal reflux disease)   . Gout   . History of kidney stones   . Hypertension   . Ischemic cardiomyopathy 07/06/2014  . Lupus anticoagulant positive   . Myocardial infarct ([place]) 2000  . Myocardial infarct ([place]) 2016  . S/P drug eluting coronary stent placement, 07/06/14, Promus 07/07/2014  . Sleep apnea    biPaP  . Subarachnoid bleed ([place]) 2015   after falling and hitting his head    Past Surgical History:  Procedure Laterality Date  . COLONOSCOPY    . EP IMPLANTABLE DEVICE [name] 11/27/2014   St. Jude Medical Fortify Assura VR  implanted by Dr Rayann Heman for primary prevention  . LEFT HEART CATH AND CORONARY ANGIOGRAPHY [name] 07/29/2018   Procedure: LEFT  HEART CATH AND CORONARY ANGIOGRAPHY;  Surgeon: Sherren Mocha, MD;  Location: [place] CV [facility];  Service: Cardiovascular;  Laterality: [name];  . LEFT HEART CATH AND CORONARY ANGIOGRAPHY [name] 08/14/2019   Procedure: LEFT HEART CATH AND CORONARY ANGIOGRAPHY;  Surgeon: Troy Sine, MD;  Location: [place] CV LAB;  Service: Cardiovascular;  Laterality: [name];  . LEFT HEART CATHETERIZATION WITH CORONARY ANGIOGRAM [name] 07/06/2014   Procedure: LEFT HEART CATHETERIZATION WITH CORONARY ANGIOGRAM;  Surgeon: Peter M Martinique, MD;  Location: [facility] CATH LAB;  Service: Cardiovascular;  Laterality: [name];  . PERCUTANEOUS CORONARY STENT INTERVENTION (PCI-S) Right 07/06/2014   Procedure: PERCUTANEOUS CORONARY STENT INTERVENTION (PCI-S);  Surgeon: Peter M Martinique, MD;  Location: [facility] CATH LAB;  Service: Cardiovascular;  Laterality: Right;  . SHOULDER ARTHROSCOPY WITH SUBACROMIAL DECOMPRESSION Left 03/05/2016   Procedure: SHOULDER ARTHROSCOPY ROTATOR CUFF REPAIR WITH SUBACROMIAL DECOMPRESSION;  Surgeon: Tania Ade, MD;  Location: [facility];  Service: Orthopedics;  Laterality: Left;  SHOULDER ARTHROSCOPY ROTATOR CUFF REPAIR WITH SUBACROMIAL DECOMPRESSION  . SHOULDER CLOSED REDUCTION Left 11/29/2015   Procedure: CLOSED REDUCTION SHOULDER;  Surgeon: Tania Ade, MD;  Location: [place];  Service: Orthopedics;  Laterality: Left;  . TRANSURETHRAL RESECTION OF PROSTATE [name] 02/24/2018   Procedure: TRANSURETHRAL RESECTION OF THE PROSTATE (TURP);  Surgeon: Irine Seal, MD;  Location: [facility];  Service: Urology;  Laterality: [name];  . WRIST SURGERY Bilateral    plates and screws     There were no vitals filed for this visit.   Subjective Assessment - 09/18/19 1440    Subjective Pt reports he is pretty good with his HEP, still having pian in his back, unable to get the shot he wants    Pertinent History DVT LLE 20 years ago, unstable angina (hospitalized 2 weeks ago), 2 MI's, scoliosis    Patient Stated Goals Pt would like to get better  at getting up off of the floor and be steadier when he is walking.    Currently in Pain? Yes    Pain Score 4     Pain Location Back    Pain Orientation Lower    Pain Descriptors / Indicators Constant    Pain Type Chronic pain    Pain Onset More than a month ago    Pain Frequency Constant              [facility] PT Assessment - 09/18/19 0001      Assessment   Medical Diagnosis Progressive Weakness                         [facility] Adult PT Treatment/Exercise - 09/18/19 0001      Transfers   Comments pt wanted to try floor to stand transfer, did with modified I using mat.  He wishes to be able to perform easier with less UE assist.       Knee/Hip Exercises: Aerobic   Nustep L4 x 6 min (UEs/LEs)      Knee/Hip Exercises: Machines for Strengthening   Cybex Knee Extension 20# - can do more next time      Knee/Hip Exercises: Standing   Other Standing Knee Exercises green band in door - counter rotation, shoulder extension    Other Standing Knee Exercises 10 reps dead lift off 8" stool with 10# VC for form      Knee/Hip Exercises: Seated   Long Arc Quad Strengthening;Both;2 sets;10 reps;Weights    Long Arc Quad Weight 5 lbs.   attempted 7.5 not able to get full extension   Other Seated Knee/Hip Exercises on blue disc, marching, FWD/BWD leans, overhead reach -AAROM for Lt shoulder       Knee/Hip Exercises: Sidelying   Clams 10 reps each regular       Knee/Hip Exercises: Prone   Straight Leg Raises Strengthening;Both;10 reps   knee straight and bent    Other Prone Exercises POE with TA contractions                    PT Short Term Goals - 09/06/19 1203      PT SHORT TERM GOAL #1   Title Pt will be I and compliant with initial HEP.    Time 1    Period Weeks    Status Achieved    Target Date 09/04/19      PT SHORT TERM GOAL #2   Title Pt will decrease 5XSST time to 15 seconds or less.    Baseline 17s    Time 3    Period Weeks    Status On-going     Target Date 09/18/19      PT SHORT TERM GOAL #3   Title Pt will increase BERG score to at least 46/54.    Baseline 41/54    Time 3  Period Weeks    Status On-going    Target Date 09/18/19             PT Long Term Goals - 09/06/19 1203      PT LONG TERM GOAL #1   Title Pt will decrease 5XSST time to 12.6s or less, in order to decrease fall risk to age bracket norm.    Baseline 17s    Time 6    Period Weeks    Status On-going      PT LONG TERM GOAL #2   Title Pt will improve BERG score to >48/54, indicating significant improvement and decrease in fall risk.    Baseline 41/54    Time 6    Period Weeks    Status On-going      PT LONG TERM GOAL #3   Title Pt will perform TUG in 13.5 seconds or less to indicate decreased risk of falls.    Baseline to be completed at 2nd visit    Time 6    Period Weeks    Status Achieved      PT LONG TERM GOAL #4   Title Pt will perform 10 squats with good form and posture with no UE support.    Time 4    Period Weeks    Status On-going                 Plan - 09/18/19 1545    Clinical Impression Statement Kevin Hart is getting stronger in his legs, able to use leg extension for quads, can lift more wt next time.  He is very weak in his glut meds and would use more work here and in his back.  Making progress to his goals.  Tends to walk and stand in forward flexed posture is able to correct with verbal cues.    Rehab Potential Good    PT Frequency 2x / week    PT Duration 6 weeks    PT Treatment/Interventions ADLs/Self Care Home Management;Moist Heat;Iontophoresis 4mg /ml Dexamethasone;Electrical Stimulation;Cryotherapy;Vasopneumatic Device;Passive range of motion;Dry needling;Energy conservation;Taping;Compression bandaging;Manual techniques;Patient/family education;Therapeutic exercise;Gait training;Stair training;Functional mobility training;Therapeutic activities;Balance training;Neuromuscular re-education           Patient will  benefit from skilled therapeutic intervention in order to improve the following deficits and impairments:  Decreased activity tolerance, Decreased balance, Decreased endurance, Abnormal gait, Decreased mobility, Impaired flexibility, Decreased strength, Impaired UE functional use, Postural dysfunction, Pain, Difficulty walking, Cardiopulmonary status limiting activity, Impaired perceived functional ability, Improper body mechanics, Impaired tone  Visit Diagnosis: Muscle weakness (generalized)  Difficulty in walking, not elsewhere classified  Abnormal posture     Problem List Patient Active Problem List   Diagnosis Date Noted  . Hypokalemia   . OSA on CPAP   . Chest pain 08/11/2019  . Precordial chest pain   . Unstable angina ([place]) 07/26/2018  . BPH with urinary obstruction 02/24/2018  . Renal hemorrhage, right 10/26/2017  . S/P left rotator cuff repair 03/05/2016  . Anterior dislocation of left shoulder 11/29/2015  . History of implantable cardioverter-defibrillator (ICD) placement 03/16/2015  . ICD (implantable cardioverter-defibrillator) in place   . Chronic systolic heart failure ([place]) 12/31/2014  . Chronic systolic dysfunction of left ventricle 11/27/2014  . S/P drug eluting coronary stent placement, mLAD 07/06/14, Promus 07/07/2014  . At risk for sudden cardiac death, EF 25-30% with PVCs, with lifevest 07/07/2014  . Coronary artery disease involving native coronary artery of native heart with unstable angina pectoris ([place])   . Cardiomyopathy,  ischemic   . History of DVT (deep vein thrombosis), on coumadin   . NSTEMI (non-ST elevated myocardial infarction) ([place]) 07/05/2014  . CAD (coronary artery disease)   . Recurrent acute deep vein thrombosis (DVT) of lower extremity ([place])   . Dyslipidemia   . GERD (gastroesophageal reflux disease)   . Anterior myocardial infarction ([place])   . Lupus anticoagulant positive     Boneta Lucks rPT  09/18/2019, 3:47 PM  [facility] [address]  [place], [place], [address] Phone: [phone]   Fax:  [phone]  Name: Kevin Hart MRN: [id number] Date of Birth: 05/14/1945

## 2019-09-20 ENCOUNTER — Other Ambulatory Visit: Payer: Self-pay

## 2019-09-20 ENCOUNTER — Ambulatory Visit: Payer: PPO | Admitting: Physical Therapy

## 2019-09-20 ENCOUNTER — Other Ambulatory Visit: Payer: Self-pay | Admitting: Orthopedic Surgery

## 2019-09-20 ENCOUNTER — Encounter: Payer: Self-pay | Admitting: Physical Therapy

## 2019-09-20 DIAGNOSIS — R262 Difficulty in walking, not elsewhere classified: Secondary | ICD-10-CM

## 2019-09-20 DIAGNOSIS — M5416 Radiculopathy, lumbar region: Secondary | ICD-10-CM

## 2019-09-20 DIAGNOSIS — M6281 Muscle weakness (generalized): Secondary | ICD-10-CM | POA: Diagnosis not present

## 2019-09-20 DIAGNOSIS — R293 Abnormal posture: Secondary | ICD-10-CM

## 2019-09-20 NOTE — Therapy (Signed)
[facility] [address]  [place], [place], [address] Phone: [phone]   Fax:  [phone]  Physical Therapy Treatment  Patient Details  Name: Kevin Hart MRN: [id number] Date of Birth: 1945-04-05 Referring Provider (PT): Leanor Kail, [place]   Encounter Date: 09/20/2019   PT End of Session - 09/20/19 1148    Visit Number 5    Number of Visits 12    Date for PT Re-Evaluation 10/09/19    Authorization Type [employer]    PT Start Time 1102    PT Stop Time 1148    PT Time Calculation (min) 46 min    Activity Tolerance Patient tolerated treatment well    Behavior During Therapy [facility] for tasks assessed/performed           Past Medical History:  Diagnosis Date  . Arthritis   . Bladder stone 10/26/2017  . BPH (benign prostatic hyperplasia)    takes Flomax daily  . CAD (coronary artery disease)    a. prior LAD stenting;  b. 06/2014 NSTEMI/PCI: LM nl, LAD 30p, 71m ISR (3.0x20 Promus DES), LCX nl, RCA nondom, nl, EF 25-30% ant, apical, dist inf [place].  . Cataracts, bilateral    immature  . Chronic systolic CHF (congestive heart failure) ([place]) 12/31/2014  . DVT (deep venous thrombosis) (H[facility])    RECURRENT left leg  . Dyslipidemia   . GERD (gastroesophageal reflux disease)   . Gout   . History of kidney stones   . Hypertension   . Ischemic cardiomyopathy 07/06/2014  . Lupus anticoagulant positive   . Myocardial infarct ([place]) 2000  . Myocardial infarct ([place]) 2016  . S/P drug eluting coronary stent placement, 07/06/14, Promus 07/07/2014  . Sleep apnea    biPaP  . Subarachnoid bleed ([place]) 2015   after falling and hitting his head    Past Surgical History:  Procedure Laterality Date  . COLONOSCOPY    . EP IMPLANTABLE DEVICE [name] 11/27/2014   St. Jude Medical Fortify Assura VR  implanted by Dr Rayann Heman for primary prevention  . LEFT HEART CATH AND CORONARY ANGIOGRAPHY [name] 07/29/2018   Procedure: LEFT  HEART CATH AND CORONARY ANGIOGRAPHY;  Surgeon: Sherren Mocha, MD;  Location: [place] CV LAB;  Service: Cardiovascular;  Laterality: [name];  . LEFT HEART CATH AND CORONARY ANGIOGRAPHY [name] 08/14/2019   Procedure: LEFT HEART CATH AND CORONARY ANGIOGRAPHY;  Surgeon: Troy Sine, MD;  Location: [place] CV LAB;  Service: Cardiovascular;  Laterality: [name];  . LEFT HEART CATHETERIZATION WITH CORONARY ANGIOGRAM [name] 07/06/2014   Procedure: LEFT HEART CATHETERIZATION WITH CORONARY ANGIOGRAM;  Surgeon: Peter M Martinique, MD;  Location: [facility] CATH LAB;  Service: Cardiovascular;  Laterality: [name];  . PERCUTANEOUS CORONARY STENT INTERVENTION (PCI-S) Right 07/06/2014   Procedure: PERCUTANEOUS CORONARY STENT INTERVENTION (PCI-S);  Surgeon: Peter M Martinique, MD;  Location: [facility] CATH LAB;  Service: Cardiovascular;  Laterality: Right;  . SHOULDER ARTHROSCOPY WITH SUBACROMIAL DECOMPRESSION Left 03/05/2016   Procedure: SHOULDER ARTHROSCOPY ROTATOR CUFF REPAIR WITH SUBACROMIAL DECOMPRESSION;  Surgeon: Tania Ade, MD;  Location: [place];  Service: Orthopedics;  Laterality: Left;  SHOULDER ARTHROSCOPY ROTATOR CUFF REPAIR WITH SUBACROMIAL DECOMPRESSION  . SHOULDER CLOSED REDUCTION Left 11/29/2015   Procedure: CLOSED REDUCTION SHOULDER;  Surgeon: Tania Ade, MD;  Location: [place];  Service: Orthopedics;  Laterality: Left;  . TRANSURETHRAL RESECTION OF PROSTATE [name] 02/24/2018   Procedure: TRANSURETHRAL RESECTION OF THE PROSTATE (TURP);  Surgeon: Irine Seal, MD;  Location: [facility];  Service: Urology;  Laterality: [name];  . WRIST SURGERY Bilateral    plates and screws     There were no vitals filed for this visit.   Subjective Assessment - 09/20/19 1103    Subjective Doing "pretty good. I'm getting better."    Pertinent History DVT LLE 20 years ago, unstable angina (hospitalized 2 weeks ago), 2 MI's, scoliosis    Patient Stated Goals Pt would like to get better at getting up off of the floor and be steadier when he is  walking.    Currently in Pain? No/denies                             [facility] Adult PT Treatment/Exercise - 09/20/19 0001      Exercises   Exercises Elbow      Elbow Exercises   Elbow Flexion Strengthening;Right;Left;10 reps;Seated    Theraband Level (Elbow Flexion) Level 2 (Red);Level 3 (Green)    Elbow Flexion Limitations red on L, green on R; palm up; cues to sit upright    Elbow Extension Strengthening;Right;Left;10 reps;Seated    Theraband Level (Elbow Extension) Level 2 (Red)    Elbow Extension Limitations R and L triceps extensions in sitting    Other elbow exercises B triceps extensions with red TB in standing x10       Knee/Hip Exercises: Stretches   Sports administrator Right;Left;1 rep;30 seconds    Sports administrator Limitations prone with strap    Gastroc Stretch Right;Left;1 rep;30 seconds    Gastroc Stretch Limitations at counter      Knee/Hip Exercises: Aerobic   Nustep L4 x 6 min (UEs/LEs)      Knee/Hip Exercises: Standing   Other Standing Knee Exercises sidestepping along counter top with yellow loop around ankles 4x length of counter w/ sitting rest break in between   more difficulty to left- placed loop below knees     Knee/Hip Exercises: Seated   Sit to Sand 1 set;10 reps;without UE support   holding blue medball at chest, seat at knee level                 PT Education - 09/20/19 1144    Education Details update to HEP    Person(s) Educated Patient    Methods Explanation;Demonstration;Tactile cues;Verbal cues;Handout    Comprehension Verbalized understanding;Returned demonstration            PT Short Term Goals - 09/06/19 1203      PT SHORT TERM GOAL #1   Title Pt will be I and compliant with initial HEP.    Time 1    Period Weeks    Status Achieved    Target Date 09/04/19      PT SHORT TERM GOAL #2   Title Pt will decrease 5XSST time to 15 seconds or less.    Baseline 17s    Time 3    Period Weeks    Status On-going     Target Date 09/18/19      PT SHORT TERM GOAL #3   Title Pt will increase BERG score to at least 46/54.    Baseline 41/54    Time 3    Period Weeks    Status On-going    Target Date 09/18/19             PT Long Term Goals - 09/06/19 1203      PT LONG TERM GOAL #1  Title Pt will decrease 5XSST time to 12.6s or less, in order to decrease fall risk to age bracket norm.    Baseline 17s    Time 6    Period Weeks    Status On-going      PT LONG TERM GOAL #2   Title Pt will improve BERG score to >48/54, indicating significant improvement and decrease in fall risk.    Baseline 41/54    Time 6    Period Weeks    Status On-going      PT LONG TERM GOAL #3   Title Pt will perform TUG in 13.5 seconds or less to indicate decreased risk of falls.    Baseline to be completed at 2nd visit    Time 6    Period Weeks    Status Achieved      PT LONG TERM GOAL #4   Title Pt will perform 10 squats with good form and posture with no UE support.    Time 4    Period Weeks    Status On-going                 Plan - 09/20/19 1149    Clinical Impression Statement Patient reporting progress with therapy. Notes that he would like to address UE strength as he feels that his arms lack the strength to push himself up to stand from the floor. Worked on B triceps and biceps strengthening with patient demonstrating good tolerance. Proceeded with standing LE strengthening ther-ex, with patient demonstrating increased weakness in L vs. R hip. Patient reporting continued difficulty getting up from his easy chair, thus worked on STS transfers with weighted resistance and low seat. Patient was able to perform these transferred with excellent ability to extend the hips and maintain upright posture. Updated HEP with exercises that were well-tolerated today. Patient reported understanding. Ended session with LE stretching d/t patient's c/o muscle soreness. No further complaints at end of session. Patient is  progressing well towards goals.    Comorbidities Arthritis, CHF, HTN, DVT, Dyslipidemia, Unstable Angina, CAD    Rehab Potential Good    PT Frequency 2x / week    PT Duration 6 weeks    PT Treatment/Interventions ADLs/Self Care Home Management;Moist Heat;Iontophoresis 4mg /ml Dexamethasone;Electrical Stimulation;Cryotherapy;Vasopneumatic Device;Passive range of motion;Dry needling;Energy conservation;Taping;Compression bandaging;Manual techniques;Patient/family education;Therapeutic exercise;Gait training;Stair training;Functional mobility training;Therapeutic activities;Balance training;Neuromuscular re-education    PT Next Visit Plan progress strength/endurance and balance, gait with and without SPC    Consulted and Agree with Plan of Care Patient           Patient will benefit from skilled therapeutic intervention in order to improve the following deficits and impairments:  Decreased activity tolerance, Decreased balance, Decreased endurance, Abnormal gait, Decreased mobility, Impaired flexibility, Decreased strength, Impaired UE functional use, Postural dysfunction, Pain, Difficulty walking, Cardiopulmonary status limiting activity, Impaired perceived functional ability, Improper body mechanics, Impaired tone  Visit Diagnosis: Muscle weakness (generalized)  Difficulty in walking, not elsewhere classified  Abnormal posture     Problem List Patient Active Problem List   Diagnosis Date Noted  . Hypokalemia   . OSA on CPAP   . Chest pain 08/11/2019  . Precordial chest pain   . Unstable angina ([place]) 07/26/2018  . BPH with urinary obstruction 02/24/2018  . Renal hemorrhage, right 10/26/2017  . S/P left rotator cuff repair 03/05/2016  . Anterior dislocation of left shoulder 11/29/2015  . History of implantable cardioverter-defibrillator (ICD) placement 03/16/2015  . ICD (implantable cardioverter-defibrillator) in place   .  Chronic systolic heart failure ([place]) 12/31/2014  . Chronic  systolic dysfunction of left ventricle 11/27/2014  . S/P drug eluting coronary stent placement, mLAD 07/06/14, Promus 07/07/2014  . At risk for sudden cardiac death, EF 25-30% with PVCs, with lifevest 07/07/2014  . Coronary artery disease involving native coronary artery of native heart with unstable angina pectoris ([place])   . Cardiomyopathy, ischemic   . History of DVT (deep vein thrombosis), on coumadin   . NSTEMI (non-ST elevated myocardial infarction) ([place]) 07/05/2014  . CAD (coronary artery disease)   . Recurrent acute deep vein thrombosis (DVT) of lower extremity ([place])   . Dyslipidemia   . GERD (gastroesophageal reflux disease)   . Anterior myocardial infarction ([place])   . Lupus anticoagulant positive      Janene Harvey, PT, DPT 09/20/19 11:52 AM   [facility] [address]  [place], [place], [address] Phone: [phone]   Fax:  [phone]  Name: Kevin Hart MRN: [id number] Date of Birth: [birth year]

## 2019-09-22 DIAGNOSIS — M545 Low back pain: Secondary | ICD-10-CM | POA: Diagnosis not present

## 2019-09-22 DIAGNOSIS — M792 Neuralgia and neuritis, unspecified: Secondary | ICD-10-CM | POA: Diagnosis not present

## 2019-09-22 DIAGNOSIS — R200 Anesthesia of skin: Secondary | ICD-10-CM | POA: Diagnosis not present

## 2019-09-26 ENCOUNTER — Ambulatory Visit (INDEPENDENT_AMBULATORY_CARE_PROVIDER_SITE_OTHER): Payer: PPO | Admitting: *Deleted

## 2019-09-26 DIAGNOSIS — I255 Ischemic cardiomyopathy: Secondary | ICD-10-CM

## 2019-09-26 DIAGNOSIS — M5136 Other intervertebral disc degeneration, lumbar region: Secondary | ICD-10-CM | POA: Diagnosis not present

## 2019-09-26 DIAGNOSIS — Z7901 Long term (current) use of anticoagulants: Secondary | ICD-10-CM | POA: Diagnosis not present

## 2019-09-26 DIAGNOSIS — M545 Low back pain: Secondary | ICD-10-CM | POA: Diagnosis not present

## 2019-09-26 DIAGNOSIS — G4733 Obstructive sleep apnea (adult) (pediatric): Secondary | ICD-10-CM | POA: Diagnosis not present

## 2019-09-26 DIAGNOSIS — I82409 Acute embolism and thrombosis of unspecified deep veins of unspecified lower extremity: Secondary | ICD-10-CM | POA: Diagnosis not present

## 2019-09-26 DIAGNOSIS — I10 Essential (primary) hypertension: Secondary | ICD-10-CM | POA: Diagnosis not present

## 2019-09-26 LAB — CUP PACEART REMOTE DEVICE CHECK
Battery Remaining Longevity: 61 mo
Battery Remaining Percentage: 57 %
Battery Voltage: 2.99 V
Brady Statistic RV Percent Paced: 1 %
Date Time Interrogation Session: 20210706040016
HighPow Impedance: 70 Ohm
HighPow Impedance: 70 Ohm
Implantable Lead Implant Date: 20160906
Implantable Lead Location: 753860
Implantable Pulse Generator Implant Date: 20160906
Lead Channel Impedance Value: 450 Ohm
Lead Channel Pacing Threshold Amplitude: 0.5 V
Lead Channel Pacing Threshold Pulse Width: 0.5 ms
Lead Channel Sensing Intrinsic Amplitude: 11.8 mV
Lead Channel Setting Pacing Amplitude: 2.5 V
Lead Channel Setting Pacing Pulse Width: 0.5 ms
Lead Channel Setting Sensing Sensitivity: 0.5 mV
Pulse Gen Serial Number: 7257239

## 2019-09-27 NOTE — Progress Notes (Signed)
Remote ICD transmission.   

## 2019-09-28 ENCOUNTER — Ambulatory Visit: Payer: PPO | Attending: Physician Assistant | Admitting: Physical Therapy

## 2019-09-28 ENCOUNTER — Other Ambulatory Visit: Payer: Self-pay

## 2019-09-28 ENCOUNTER — Encounter: Payer: Self-pay | Admitting: Physical Therapy

## 2019-09-28 DIAGNOSIS — R262 Difficulty in walking, not elsewhere classified: Secondary | ICD-10-CM | POA: Diagnosis not present

## 2019-09-28 DIAGNOSIS — R293 Abnormal posture: Secondary | ICD-10-CM | POA: Insufficient documentation

## 2019-09-28 DIAGNOSIS — M6281 Muscle weakness (generalized): Secondary | ICD-10-CM

## 2019-09-28 NOTE — Therapy (Signed)
[facility] [address]  [place], [place], [address] Phone: [phone]   Fax:  [phone]  Physical Therapy Treatment  Patient Details  Name: Kevin Hart MRN: [id number] Date of Birth: [birth year] Referring Provider (PT): Leanor Kail, [place]   Encounter Date: 09/28/2019   PT End of Session - 09/28/19 1014    Visit Number 6    Number of Visits 12    Date for PT Re-Evaluation 10/09/19    Authorization Type [employer]    PT Start Time 0934    PT Stop Time 1013    PT Time Calculation (min) 39 min    Activity Tolerance Patient tolerated treatment well    Behavior During Therapy [facility] for tasks assessed/performed           Past Medical History:  Diagnosis Date  . Arthritis   . Bladder stone 10/26/2017  . BPH (benign prostatic hyperplasia)    takes Flomax daily  . CAD (coronary artery disease)    a. prior LAD stenting;  b. 06/2014 NSTEMI/PCI: LM nl, LAD 30p, 75m ISR (3.0x20 Promus DES), LCX nl, RCA nondom, nl, EF 25-30% ant, apical, dist inf [place].  . Cataracts, bilateral    immature  . Chronic systolic CHF (congestive heart failure) ([place]) 12/31/2014  . DVT (deep venous thrombosis) (HCC)    RECURRENT left leg  . Dyslipidemia   . GERD (gastroesophageal reflux disease)   . Gout   . History of kidney stones   . Hypertension   . Ischemic cardiomyopathy 07/06/2014  . Lupus anticoagulant positive   . Myocardial infarct ([place]) 2000  . Myocardial infarct ([place]) 2016  . S/P drug eluting coronary stent placement, 07/06/14, Promus 07/07/2014  . Sleep apnea    biPaP  . Subarachnoid bleed ([place]) 2015   after falling and hitting his head    Past Surgical History:  Procedure Laterality Date  . COLONOSCOPY    . EP IMPLANTABLE DEVICE [name] 11/27/2014   St. Jude Medical Fortify Assura VR  implanted by Dr Rayann Heman for primary prevention  . LEFT HEART CATH AND CORONARY ANGIOGRAPHY [name] 07/29/2018   Procedure: LEFT  HEART CATH AND CORONARY ANGIOGRAPHY;  Surgeon: Sherren Mocha, MD;  Location: [facility] CV LAB;  Service: Cardiovascular;  Laterality: [name];  . LEFT HEART CATH AND CORONARY ANGIOGRAPHY [name] 08/14/2019   Procedure: LEFT HEART CATH AND CORONARY ANGIOGRAPHY;  Surgeon: Troy Sine, MD;  Location: [facility] CV LAB;  Service: Cardiovascular;  Laterality: [name];  . LEFT HEART CATHETERIZATION WITH CORONARY ANGIOGRAM [name] 07/06/2014   Procedure: LEFT HEART CATHETERIZATION WITH CORONARY ANGIOGRAM;  Surgeon: Peter M Martinique, MD;  Location: [facility] CATH LAB;  Service: Cardiovascular;  Laterality: [name];  . PERCUTANEOUS CORONARY STENT INTERVENTION (PCI-S) Right 07/06/2014   Procedure: PERCUTANEOUS CORONARY STENT INTERVENTION (PCI-S);  Surgeon: Peter M Martinique, MD;  Location: [facility] CATH LAB;  Service: Cardiovascular;  Laterality: Right;  . SHOULDER ARTHROSCOPY WITH SUBACROMIAL DECOMPRESSION Left 03/05/2016   Procedure: SHOULDER ARTHROSCOPY ROTATOR CUFF REPAIR WITH SUBACROMIAL DECOMPRESSION;  Surgeon: Tania Ade, MD;  Location: [facility];  Service: Orthopedics;  Laterality: Left;  SHOULDER ARTHROSCOPY ROTATOR CUFF REPAIR WITH SUBACROMIAL DECOMPRESSION  . SHOULDER CLOSED REDUCTION Left 11/29/2015   Procedure: CLOSED REDUCTION SHOULDER;  Surgeon: Tania Ade, MD;  Location: [facility];  Service: Orthopedics;  Laterality: Left;  . TRANSURETHRAL RESECTION OF PROSTATE [name] 02/24/2018   Procedure: TRANSURETHRAL RESECTION OF THE PROSTATE (TURP);  Surgeon: Irine Seal, MD;  Location: [facility];  Service: Urology;  Laterality: [name];  . WRIST SURGERY Bilateral    plates and screws     There were no vitals filed for this visit.   Subjective Assessment - 09/28/19 0933    Subjective Not much new since last session. Has been doing well. New HEP is going well.    Pertinent History DVT LLE 20 years ago, unstable angina (hospitalized 2 weeks ago), 2 MI's, scoliosis    Patient Stated Goals Pt would like to get better at getting up off of the  floor and be steadier when he is walking.    Currently in Pain? No/denies                             [facility] Adult PT Treatment/Exercise - 09/28/19 0001      Exercises   Exercises Shoulder      Knee/Hip Exercises: Stretches   Passive Hamstring Stretch Right;Left;1 rep;30 seconds    Passive Hamstring Stretch Limitations supine with strap   cues to avoid pushing into pain   Hip Flexor Stretch Right;Left;1 rep;30 seconds    Hip Flexor Stretch Limitations mod thomas with strap   cues to avoid pushing into pain     Knee/Hip Exercises: Aerobic   Nustep L4 x 6 min (UEs/LEs)      Knee/Hip Exercises: Machines for Strengthening   Cybex Knee Extension B LEs 2x10 20#   attempted 25#- too challenging   Cybex Knee Flexion B LEs 2x10 25#      Shoulder Exercises: Supine   Horizontal ABduction Strengthening;Both;10 reps;Theraband    Theraband Level (Shoulder Horizontal ABduction) Level 1 (Yellow)    Horizontal ABduction Weight (lbs) cues to maintain elbows straight and retract scapulae    External Rotation Strengthening;Both;10 reps;Theraband    Theraband Level (Shoulder External Rotation) Level 1 (Yellow)    External Rotation Limitations 2x5 reps d/t difficulty                  PT Education - 09/28/19 1014    Education Details update to HEP    Person(s) Educated Patient    Methods Explanation;Demonstration;Tactile cues;Verbal cues;Handout    Comprehension Verbalized understanding;Returned demonstration            PT Short Term Goals - 09/06/19 1203      PT SHORT TERM GOAL #1   Title Pt will be I and compliant with initial HEP.    Time 1    Period Weeks    Status Achieved    Target Date 09/04/19      PT SHORT TERM GOAL #2   Title Pt will decrease 5XSST time to 15 seconds or less.    Baseline 17s    Time 3    Period Weeks    Status On-going    Target Date 09/18/19      PT SHORT TERM GOAL #3   Title Pt will increase BERG score to at least 46/54.     Baseline 41/54    Time 3    Period Weeks    Status On-going    Target Date 09/18/19             PT Long Term Goals - 09/06/19 1203      PT LONG TERM GOAL #1   Title Pt will decrease 5XSST time to 12.6s or less, in order to decrease fall risk to age bracket norm.    Baseline 17s  Time 6    Period Weeks    Status On-going      PT LONG TERM GOAL #2   Title Pt will improve BERG score to >48/54, indicating significant improvement and decrease in fall risk.    Baseline 41/54    Time 6    Period Weeks    Status On-going      PT LONG TERM GOAL #3   Title Pt will perform TUG in 13.5 seconds or less to indicate decreased risk of falls.    Baseline to be completed at 2nd visit    Time 6    Period Weeks    Status Achieved      PT LONG TERM GOAL #4   Title Pt will perform 10 squats with good form and posture with no UE support.    Time 4    Period Weeks    Status On-going                 Plan - 09/28/19 1014    Clinical Impression Statement Patient without new complaints today. Patient performed machine strengthening ther-ex to address LE weakness. Patient was able to demonstrate good quad and HS muscle control with strength training today. Patient with report of LBP and muscle fatigue after machine strengthening, thus proceeded with gentle LE stretching. Initiated periscapular strengthening with patient in support with considerable weakness demonstrated in B shoulder. Updated HEP with periscapular ther-ex for continued practice at home. Patient reported understanding. Patient reported 3-4/10 pain in LB at end of session but declined modalities. No further complaints at end of session.    Comorbidities Arthritis, CHF, HTN, DVT, Dyslipidemia, Unstable Angina, CAD    Rehab Potential Good    PT Frequency 2x / week    PT Duration 6 weeks    PT Treatment/Interventions ADLs/Self Care Home Management;Moist Heat;Iontophoresis 4mg /ml Dexamethasone;Electrical  Stimulation;Cryotherapy;Vasopneumatic Device;Passive range of motion;Dry needling;Energy conservation;Taping;Compression bandaging;Manual techniques;Patient/family education;Therapeutic exercise;Gait training;Stair training;Functional mobility training;Therapeutic activities;Balance training;Neuromuscular re-education    PT Next Visit Plan progress strength/endurance and balance, gait with and without SPC    Consulted and Agree with Plan of Care Patient           Patient will benefit from skilled therapeutic intervention in order to improve the following deficits and impairments:  Decreased activity tolerance, Decreased balance, Decreased endurance, Abnormal gait, Decreased mobility, Impaired flexibility, Decreased strength, Impaired UE functional use, Postural dysfunction, Pain, Difficulty walking, Cardiopulmonary status limiting activity, Impaired perceived functional ability, Improper body mechanics, Impaired tone  Visit Diagnosis: Muscle weakness (generalized)  Difficulty in walking, not elsewhere classified  Abnormal posture     Problem List Patient Active Problem List   Diagnosis Date Noted  . Hypokalemia   . OSA on CPAP   . Chest pain 08/11/2019  . Precordial chest pain   . Unstable angina ([place]) 07/26/2018  . BPH with urinary obstruction 02/24/2018  . Renal hemorrhage, right 10/26/2017  . S/P left rotator cuff repair 03/05/2016  . Anterior dislocation of left shoulder 11/29/2015  . History of implantable cardioverter-defibrillator (ICD) placement 03/16/2015  . ICD (implantable cardioverter-defibrillator) in place   . Chronic systolic heart failure ([place]) 12/31/2014  . Chronic systolic dysfunction of left ventricle 11/27/2014  . S/P drug eluting coronary stent placement, mLAD 07/06/14, Promus 07/07/2014  . At risk for sudden cardiac death, EF 25-30% with PVCs, with lifevest 07/07/2014  . Coronary artery disease involving native coronary artery of native heart with unstable  angina pectoris ([place])   . Cardiomyopathy, ischemic   .  History of DVT (deep vein thrombosis), on coumadin   . NSTEMI (non-ST elevated myocardial infarction) ([place]) 07/05/2014  . CAD (coronary artery disease)   . Recurrent acute deep vein thrombosis (DVT) of lower extremity ([place])   . Dyslipidemia   . GERD (gastroesophageal reflux disease)   . Anterior myocardial infarction ([place])   . Lupus anticoagulant positive      Janene Harvey, PT, DPT 09/28/19 10:15 AM   [facility] [address]  [place], [place], [address] Phone: [phone]   Fax:  [phone]  Name: Kevin Hart MRN: [id number] Date of Birth: [birth year]

## 2019-10-03 ENCOUNTER — Ambulatory Visit: Payer: PPO | Admitting: Physical Therapy

## 2019-10-03 ENCOUNTER — Other Ambulatory Visit: Payer: Self-pay

## 2019-10-03 ENCOUNTER — Encounter: Payer: Self-pay | Admitting: Physical Therapy

## 2019-10-03 DIAGNOSIS — R262 Difficulty in walking, not elsewhere classified: Secondary | ICD-10-CM

## 2019-10-03 DIAGNOSIS — M6281 Muscle weakness (generalized): Secondary | ICD-10-CM

## 2019-10-03 DIAGNOSIS — R293 Abnormal posture: Secondary | ICD-10-CM

## 2019-10-03 NOTE — Therapy (Signed)
[facility] [address]  [place], [place], [address] Phone: [phone]   Fax:  [phone]  Physical Therapy Treatment  Patient Details  Name: Kevin Hart MRN: [id number] Date of Birth: 02-Dec-1945 Referring Provider (PT): [place], [place]   Encounter Date: 10/03/2019   PT End of Session - 10/03/19 0931    Visit Number 7    Number of Visits 12    Date for PT Re-Evaluation 10/09/19    Authorization Type [employer]    PT Start Time 0848    PT Stop Time 0927    PT Time Calculation (min) 39 min    Activity Tolerance Patient tolerated treatment well;Patient limited by pain    Behavior During Therapy [facility] for tasks assessed/performed           Past Medical History:  Diagnosis Date   Arthritis    Bladder stone 10/26/2017   BPH (benign prostatic hyperplasia)    takes Flomax daily   CAD (coronary artery disease)    a. prior LAD stenting;  b. 06/2014 NSTEMI/PCI: LM nl, LAD 30p, 27m ISR (3.0x20 Promus DES), LCX nl, RCA nondom, nl, EF 25-30% ant, apical, dist inf [place].   Cataracts, bilateral    immature   Chronic systolic CHF (congestive heart failure) ([place]) 12/31/2014   DVT (deep venous thrombosis) (HCC)    RECURRENT left leg   Dyslipidemia    GERD (gastroesophageal reflux disease)    Gout    History of kidney stones    Hypertension    Ischemic cardiomyopathy 07/06/2014   Lupus anticoagulant positive    Myocardial infarct ([place]) 2000   Myocardial infarct ([place]) 2016   S/P drug eluting coronary stent placement, 07/06/14, Promus 07/07/2014   Sleep apnea    biPaP   Subarachnoid bleed ([place]) 2015   after falling and hitting his head    Past Surgical History:  Procedure Laterality Date   COLONOSCOPY     EP IMPLANTABLE DEVICE [name] 11/27/2014   St. Jude Medical Fortify Assura VR  implanted by Dr Rayann Heman for primary prevention   LEFT HEART CATH AND CORONARY ANGIOGRAPHY [name]  07/29/2018   Procedure: LEFT HEART CATH AND CORONARY ANGIOGRAPHY;  Surgeon: Sherren Mocha, MD;  Location: [facility] CV LAB;  Service: Cardiovascular;  Laterality: [name];   LEFT HEART CATH AND CORONARY ANGIOGRAPHY [name] 08/14/2019   Procedure: LEFT HEART CATH AND CORONARY ANGIOGRAPHY;  Surgeon: Troy Sine, MD;  Location: [facility] CV LAB;  Service: Cardiovascular;  Laterality: [name];   LEFT HEART CATHETERIZATION WITH CORONARY ANGIOGRAM [name] 07/06/2014   Procedure: LEFT HEART CATHETERIZATION WITH CORONARY ANGIOGRAM;  Surgeon: Peter M Martinique, MD;  Location: [facility] CATH LAB;  Service: Cardiovascular;  Laterality: [name];   PERCUTANEOUS CORONARY STENT INTERVENTION (PCI-S) Right 07/06/2014   Procedure: PERCUTANEOUS CORONARY STENT INTERVENTION (PCI-S);  Surgeon: Peter M Martinique, MD;  Location: [facility] CATH LAB;  Service: Cardiovascular;  Laterality: Right;   SHOULDER ARTHROSCOPY WITH SUBACROMIAL DECOMPRESSION Left 03/05/2016   Procedure: SHOULDER ARTHROSCOPY ROTATOR CUFF REPAIR WITH SUBACROMIAL DECOMPRESSION;  Surgeon: Tania Ade, MD;  Location: [facility];  Service: Orthopedics;  Laterality: Left;  SHOULDER ARTHROSCOPY ROTATOR CUFF REPAIR WITH SUBACROMIAL DECOMPRESSION   SHOULDER CLOSED REDUCTION Left 11/29/2015   Procedure: CLOSED REDUCTION SHOULDER;  Surgeon: Tania Ade, MD;  Location: [facility];  Service: Orthopedics;  Laterality: Left;   TRANSURETHRAL RESECTION OF PROSTATE [name] 02/24/2018   Procedure: TRANSURETHRAL RESECTION OF THE PROSTATE (TURP);  Surgeon: Irine Seal, MD;  Location: [facility];  Service: Urology;  Laterality: [name];   WRIST SURGERY Bilateral    plates and screws     There were no vitals filed for this visit.   Subjective Assessment - 10/03/19 0851    Subjective Scheduled for a spinal injection next Wednesday. Doing well.    Pertinent History DVT LLE 20 years ago, unstable angina (hospitalized 2 weeks ago), 2 MI's, scoliosis    Patient Stated Goals Pt would like to get better at getting up  off of the floor and be steadier when he is walking.    Currently in Pain? No/denies                             [facility] Adult PT Treatment/Exercise - 10/03/19 0001      Bed Mobility   Bed Mobility Rolling Right;Right Sidelying to Sit;Rolling Left;Left Sidelying to Sit    Rolling Right Supervision/verbal cueing    Rolling Left Supervision/Verbal cueing    Right Sidelying to Sit Supervision/Verbal cueing   cueing to push off R elbow to avoid strain on L UE   Left Sidelying to Sit Independent      Elbow Exercises   Elbow Extension Strengthening;Right;Left;10 reps;Standing    Theraband Level (Elbow Extension) Level 2 (Red)    Elbow Extension Limitations B triceps extension    cues for upright posture     Knee/Hip Exercises: Aerobic   Nustep L5 x 6 min (UEs/LEs)      Knee/Hip Exercises: Supine   Other Supine Knee/Hip Exercises R/L alt bent knee fallouts with green TB 2x10   L hip weakness     Shoulder Exercises: Supine   External Rotation --    Theraband Level (Shoulder External Rotation) --    External Rotation Limitations --      Shoulder Exercises: Seated   Horizontal ABduction Strengthening;Both;10 reps    Theraband Level (Shoulder Horizontal ABduction) Level 1 (Yellow)    Horizontal ABduction Weight (lbs) 5x in sitting, 10x supine    External Rotation Strengthening;Both;5 reps    Theraband Level (Shoulder External Rotation) Level 1 (Yellow)    External Rotation Limitations 2x5 reps d/t difficulty      Shoulder Exercises: Standing   Extension Strengthening;Both;10 reps;Theraband    Theraband Level (Shoulder Extension) Level 2 (Red)    Extension Limitations 2x10; cues to correct kyphotic posture      Shoulder Exercises: Stretch   Corner Stretch 1 rep;30 seconds    Corner Stretch Limitations minimal ROM in L shoulder    Other Shoulder Stretches R and L low pec stretch in doorway 30" each                    PT Short Term Goals - 09/06/19 1203        PT SHORT TERM GOAL #1   Title Pt will be I and compliant with initial HEP.    Time 1    Period Weeks    Status Achieved    Target Date 09/04/19      PT SHORT TERM GOAL #2   Title Pt will decrease 5XSST time to 15 seconds or less.    Baseline 17s    Time 3    Period Weeks    Status On-going    Target Date 09/18/19      PT SHORT TERM GOAL #3   Title Pt will increase BERG score to at least 46/54.  Baseline 41/54    Time 3    Period Weeks    Status On-going    Target Date 09/18/19             PT Long Term Goals - 09/06/19 1203      PT LONG TERM GOAL #1   Title Pt will decrease 5XSST time to 12.6s or less, in order to decrease fall risk to age bracket norm.    Baseline 17s    Time 6    Period Weeks    Status On-going      PT LONG TERM GOAL #2   Title Pt will improve BERG score to >48/54, indicating significant improvement and decrease in fall risk.    Baseline 41/54    Time 6    Period Weeks    Status On-going      PT LONG TERM GOAL #3   Title Pt will perform TUG in 13.5 seconds or less to indicate decreased risk of falls.    Baseline to be completed at 2nd visit    Time 6    Period Weeks    Status Achieved      PT LONG TERM GOAL #4   Title Pt will perform 10 squats with good form and posture with no UE support.    Time 4    Period Weeks    Status On-going                 Plan - 10/03/19 0932    Clinical Impression Statement Patient without new complaints today. Notes that he is scheduled for a spinal injection on 07/21. Worked on progressive shoulder strengthening and stretching for improved functional activity tolerance and posture. Patient required consistent cueing to maintain upright posture and correct forward head with standing/sitting ther-ex today. Weakness in L hip and shoulder evident with exercises today, requiring minor modifications for improved ability/comfort. Patient reporting some continued difficulty with bed mobility, thus  revied rolling and sidelying to side with encouragement to push off R elbow to avoid strain on L UE. Encouraged patient to continue HEP compliance at home- patient agreeable and without complaints at end of session. Progressing well towards goals.    Comorbidities Arthritis, CHF, HTN, DVT, Dyslipidemia, Unstable Angina, CAD    Rehab Potential Good    PT Frequency 2x / week    PT Duration 6 weeks    PT Treatment/Interventions ADLs/Self Care Home Management;Moist Heat;Iontophoresis 4mg /ml Dexamethasone;Electrical Stimulation;Cryotherapy;Vasopneumatic Device;Passive range of motion;Dry needling;Energy conservation;Taping;Compression bandaging;Manual techniques;Patient/family education;Therapeutic exercise;Gait training;Stair training;Functional mobility training;Therapeutic activities;Balance training;Neuromuscular re-education    PT Next Visit Plan progress strength/endurance and balance, gait with and without SPC    Consulted and Agree with Plan of Care Patient           Patient will benefit from skilled therapeutic intervention in order to improve the following deficits and impairments:  Decreased activity tolerance, Decreased balance, Decreased endurance, Abnormal gait, Decreased mobility, Impaired flexibility, Decreased strength, Impaired UE functional use, Postural dysfunction, Pain, Difficulty walking, Cardiopulmonary status limiting activity, Impaired perceived functional ability, Improper body mechanics, Impaired tone  Visit Diagnosis: Muscle weakness (generalized)  Difficulty in walking, not elsewhere classified  Abnormal posture     Problem List Patient Active Problem List   Diagnosis Date Noted   Hypokalemia    OSA on CPAP    Chest pain 08/11/2019   Precordial chest pain    Unstable angina ([place]) 07/26/2018   BPH with urinary obstruction 02/24/2018   Renal hemorrhage, right 10/26/2017  S/P left rotator cuff repair 03/05/2016   Anterior dislocation of left  shoulder 11/29/2015   History of implantable cardioverter-defibrillator (ICD) placement 03/16/2015   ICD (implantable cardioverter-defibrillator) in place    Chronic systolic heart failure ([place]) 68/34/1962   Chronic systolic dysfunction of left ventricle 11/27/2014   S/P drug eluting coronary stent placement, mLAD 07/06/14, Promus 07/07/2014   At risk for sudden cardiac death, EF 25-30% with PVCs, with lifevest 07/07/2014   Coronary artery disease involving native coronary artery of native heart with unstable angina pectoris (HCC)    Cardiomyopathy, ischemic    History of DVT (deep vein thrombosis), on coumadin    NSTEMI (non-ST elevated myocardial infarction) ([place]) 07/05/2014   CAD (coronary artery disease)    Recurrent acute deep vein thrombosis (DVT) of lower extremity ([place])    Dyslipidemia    GERD (gastroesophageal reflux disease)    Anterior myocardial infarction ([place])    Lupus anticoagulant positive     Janene Harvey, PT, DPT 10/03/19 9:33 AM   [facility] [address]  [place], [place], [address] Phone: [phone]   Fax:  [phone]  Name: KHAM ZUCKERMAN MRN: [id number] Date of Birth: 1945/08/02

## 2019-10-05 ENCOUNTER — Ambulatory Visit: Payer: PPO | Admitting: Physical Therapy

## 2019-10-05 ENCOUNTER — Encounter: Payer: Self-pay | Admitting: Physical Therapy

## 2019-10-05 ENCOUNTER — Other Ambulatory Visit: Payer: Self-pay

## 2019-10-05 VITALS — HR 83

## 2019-10-05 DIAGNOSIS — R262 Difficulty in walking, not elsewhere classified: Secondary | ICD-10-CM

## 2019-10-05 DIAGNOSIS — M6281 Muscle weakness (generalized): Secondary | ICD-10-CM | POA: Diagnosis not present

## 2019-10-05 DIAGNOSIS — R293 Abnormal posture: Secondary | ICD-10-CM

## 2019-10-05 NOTE — Therapy (Signed)
[facility] [address]  [place], [place], [address] Phone: [phone]   Fax:  [phone]  Physical Therapy Treatment  Patient Details  Name: Kevin Hart MRN: [id number] Date of Birth: [birth year] Referring Provider (PT): Leanor Kail, [place]   Encounter Date: 10/05/2019   PT End of Session - 10/05/19 1147    Visit Number 8    Number of Visits 12    Date for PT Re-Evaluation 10/09/19    Authorization Type [employer]    PT Start Time 1102    PT Stop Time 1145    PT Time Calculation (min) 43 min    Activity Tolerance Patient tolerated treatment well;Patient limited by pain;Patient limited by fatigue    Behavior During Therapy [facility] for tasks assessed/performed           Past Medical History:  Diagnosis Date   Arthritis    Bladder stone 10/26/2017   BPH (benign prostatic hyperplasia)    takes Flomax daily   CAD (coronary artery disease)    a. prior LAD stenting;  b. 06/2014 NSTEMI/PCI: LM nl, LAD 30p, 59m ISR (3.0x20 Promus DES), LCX nl, RCA nondom, nl, EF 25-30% ant, apical, dist inf [place].   Cataracts, bilateral    immature   Chronic systolic CHF (congestive heart failure) ([place]) 12/31/2014   DVT (deep venous thrombosis) (HCC)    RECURRENT left leg   Dyslipidemia    GERD (gastroesophageal reflux disease)    Gout    History of kidney stones    Hypertension    Ischemic cardiomyopathy 07/06/2014   Lupus anticoagulant positive    Myocardial infarct ([place]) 2000   Myocardial infarct ([place]) 2016   S/P drug eluting coronary stent placement, 07/06/14, Promus 07/07/2014   Sleep apnea    biPaP   Subarachnoid bleed ([place]) 2015   after falling and hitting his head    Past Surgical History:  Procedure Laterality Date   COLONOSCOPY     EP IMPLANTABLE DEVICE [name] 11/27/2014   [employer]. Jude [employer] Fortify Assura VR  implanted by Dr Rayann Heman for primary prevention   LEFT HEART CATH AND  CORONARY ANGIOGRAPHY [name] 07/29/2018   Procedure: LEFT HEART CATH AND CORONARY ANGIOGRAPHY;  Surgeon: Sherren Mocha, MD;  Location: [place] [facility];  Service: Cardiovascular;  Laterality: [name];   LEFT HEART CATH AND CORONARY ANGIOGRAPHY [name] 08/14/2019   Procedure: LEFT HEART CATH AND CORONARY ANGIOGRAPHY;  Surgeon: Troy Sine, MD;  Location: [place] [facility];  Service: Cardiovascular;  Laterality: [name];   LEFT HEART CATHETERIZATION WITH CORONARY ANGIOGRAM [name] 07/06/2014   Procedure: LEFT HEART CATHETERIZATION WITH CORONARY ANGIOGRAM;  Surgeon: Peter M Martinique, MD;  Location: [facility] CATH LAB;  Service: Cardiovascular;  Laterality: [name];   PERCUTANEOUS CORONARY STENT INTERVENTION (PCI-S) Right 07/06/2014   Procedure: PERCUTANEOUS CORONARY STENT INTERVENTION (PCI-S);  Surgeon: Peter M Martinique, MD;  Location: [facility] CATH LAB;  Service: Cardiovascular;  Laterality: Right;   SHOULDER ARTHROSCOPY WITH SUBACROMIAL DECOMPRESSION Left 03/05/2016   Procedure: SHOULDER ARTHROSCOPY ROTATOR CUFF REPAIR WITH SUBACROMIAL DECOMPRESSION;  Surgeon: Tania Ade, MD;  Location: [place];  Service: Orthopedics;  Laterality: Left;  SHOULDER ARTHROSCOPY ROTATOR CUFF REPAIR WITH SUBACROMIAL DECOMPRESSION   SHOULDER CLOSED REDUCTION Left 11/29/2015   Procedure: CLOSED REDUCTION SHOULDER;  Surgeon: Tania Ade, MD;  Location: [place];  Service: Orthopedics;  Laterality: Left;   TRANSURETHRAL RESECTION OF PROSTATE [name] 02/24/2018   Procedure: TRANSURETHRAL RESECTION OF THE PROSTATE (TURP);  Surgeon:  Irine Seal, MD;  Location: [facility];  Service: Urology;  Laterality: [name];   WRIST SURGERY Bilateral    plates and screws     Vitals:   10/05/19 1140  Pulse: 83  SpO2: 97%     Subjective Assessment - 10/05/19 1102    Subjective His legs are really sore this AM- probably because he walked 1/3 mile this AM. Does this most days.    Pertinent History DVT LLE 20 years ago, unstable angina (hospitalized 2 weeks ago), 2 MI's,  scoliosis    Patient Stated Goals Pt would like to get better at getting up off of the floor and be steadier when he is walking.    Currently in Pain? Yes    Multiple Pain Sites Yes    Pain Score 4    Pain Location Leg    Pain Orientation Right;Left;Anterior    Pain Descriptors / Indicators Sore    Pain Type Acute pain                             [facility] Adult PT Treatment/Exercise - 10/05/19 0001      Knee/Hip Exercises: Aerobic   Nustep L5 x 6 min (UEs/LEs)      Knee/Hip Exercises: Standing   Forward Step Up Right;Left;1 set;10 reps;Hand Hold: 1;Step Height: 6"    Forward Step Up Limitations R/L step up/down with step through pattern on L, step-to pattern on R    Other Standing Knee Exercises alt foot tap on 9" step with 1 UE support on counter with 2# ankle wts 3x10    Other Standing Knee Exercises sidestepping along counter top with yellow loop around ankles 4x length of counter w/ sitting rest break in between      Knee/Hip Exercises: Seated   Sit to Sand 2 sets;5 reps;without UE support   holding blue medball at chest     Shoulder Exercises: Standing   Other Standing Exercises R/L resisted trunk rotation with red TB 10x each   difficulty maintaining shoulders at 90deg                 PT Education - 10/05/19 1147    Education Details verbal review of HEP    Person(s) Educated Patient    Methods Explanation;Demonstration;Tactile cues;Verbal cues    Comprehension Verbalized understanding            PT Short Term Goals - 09/06/19 1203      PT SHORT TERM GOAL #1   Title Pt will be I and compliant with initial HEP.    Time 1    Period Weeks    Status Achieved    Target Date 09/04/19      PT SHORT TERM GOAL #2   Title Pt will decrease 5XSST time to 15 seconds or less.    Baseline 17s    Time 3    Period Weeks    Status On-going    Target Date 09/18/19      PT SHORT TERM GOAL #3   Title Pt will increase BERG score to at least 46/54.     Baseline 41/54    Time 3    Period Weeks    Status On-going    Target Date 09/18/19             PT Long Term Goals - 09/06/19 1203      PT LONG TERM GOAL #1   Title Pt will  decrease 5XSST time to 12.6s or less, in order to decrease fall risk to age bracket norm.    Baseline 17s    Time 6    Period Weeks    Status On-going      PT LONG TERM GOAL #2   Title Pt will improve BERG score to >48/54, indicating significant improvement and decrease in fall risk.    Baseline 41/54    Time 6    Period Weeks    Status On-going      PT LONG TERM GOAL #3   Title Pt will perform TUG in 13.5 seconds or less to indicate decreased risk of falls.    Baseline to be completed at 2nd visit    Time 6    Period Weeks    Status Achieved      PT LONG TERM GOAL #4   Title Pt will perform 10 squats with good form and posture with no UE support.    Time 4    Period Weeks    Status On-going                 Plan - 10/05/19 1148    Clinical Impression Statement Patient reporting LE soreness d/t walking 1/3 of a mile for exercise this AM. Worked on STS transfers with cueing to stand fully upright upon standing and control eccentric lower when sitting. Patient demonstrated L hip drop d/t R lateral hip weakness with stepping and single leg standing activities. Intermittent sitting rest breaks required d/t fatigue and LBP today. Demonstrated quad weakness and difficulty performing step up on R vs. L LE, requiring step-to pattern on R LE and step-through pattern on L. Verbally reviewed HEP for max carryover and understanding and encouraged consistent compliance- patient reported understanding. Ended session without complaints. Patient tolerated session well.    Comorbidities Arthritis, CHF, HTN, DVT, Dyslipidemia, Unstable Angina, CAD    Rehab Potential Good    PT Frequency 2x / week    PT Duration 6 weeks    PT Treatment/Interventions ADLs/Self Care Home Management;Moist Heat;Iontophoresis 4mg /ml  Dexamethasone;Electrical Stimulation;Cryotherapy;Vasopneumatic Device;Passive range of motion;Dry needling;Energy conservation;Taping;Compression bandaging;Manual techniques;Patient/family education;Therapeutic exercise;Gait training;Stair training;Functional mobility training;Therapeutic activities;Balance training;Neuromuscular re-education    PT Next Visit Plan progress strength/endurance and balance, gait with and without SPC    Consulted and Agree with Plan of Care Patient           Patient will benefit from skilled therapeutic intervention in order to improve the following deficits and impairments:  Decreased activity tolerance, Decreased balance, Decreased endurance, Abnormal gait, Decreased mobility, Impaired flexibility, Decreased strength, Impaired UE functional use, Postural dysfunction, Pain, Difficulty walking, Cardiopulmonary status limiting activity, Impaired perceived functional ability, Improper body mechanics, Impaired tone  Visit Diagnosis: Muscle weakness (generalized)  Difficulty in walking, not elsewhere classified  Abnormal posture     Problem List Patient Active Problem List   Diagnosis Date Noted   Hypokalemia    OSA on CPAP    Chest pain 08/11/2019   Precordial chest pain    Unstable angina ([place]) 07/26/2018   BPH with urinary obstruction 02/24/2018   Renal hemorrhage, right 10/26/2017   S/P left rotator cuff repair 03/05/2016   Anterior dislocation of left shoulder 11/29/2015   History of implantable cardioverter-defibrillator (ICD) placement 03/16/2015   ICD (implantable cardioverter-defibrillator) in place    Chronic systolic heart failure ([place]) 68/01/5725   Chronic systolic dysfunction of left ventricle 11/27/2014   S/P drug eluting coronary stent placement, mLAD 07/06/14, Promus 07/07/2014  At risk for sudden cardiac death, EF 25-30% with PVCs, with lifevest 07/07/2014   Coronary artery disease involving native coronary artery of  native heart with unstable angina pectoris (HCC)    Cardiomyopathy, ischemic    History of DVT (deep vein thrombosis), on coumadin    NSTEMI (non-ST elevated myocardial infarction) ([place]) 07/05/2014   CAD (coronary artery disease)    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  [place], [place], [address] Phone: [phone]   Fax:  [phone]  Name: Kevin Hart MRN: [id number] Date of Birth: 02/28/1946

## 2019-10-09 ENCOUNTER — Encounter: Payer: Self-pay | Admitting: Physical Therapy

## 2019-10-09 ENCOUNTER — Ambulatory Visit: Payer: PPO | Admitting: Physical Therapy

## 2019-10-09 ENCOUNTER — Other Ambulatory Visit: Payer: Self-pay

## 2019-10-09 DIAGNOSIS — R262 Difficulty in walking, not elsewhere classified: Secondary | ICD-10-CM

## 2019-10-09 DIAGNOSIS — R293 Abnormal posture: Secondary | ICD-10-CM

## 2019-10-09 DIAGNOSIS — M6281 Muscle weakness (generalized): Secondary | ICD-10-CM

## 2019-10-09 NOTE — Therapy (Signed)
[facility] [address]  [place], [place], [address] Phone: [phone]   Fax:  [phone]  Physical Therapy Progress Note  Patient Details  Name: Kevin Hart MRN: [id number] Date of Birth: [birth year] Referring Provider (PT): Leanor Kail, [place]   Progress Note Reporting Period 08/28/19 to 10/09/19  See note below for Objective Data and Assessment of Progress/Goals.     Encounter Date: 10/09/2019   PT End of Session - 10/09/19 1444    Visit Number 9    Number of Visits 17    Date for PT Re-Evaluation 11/06/19    Authorization Type [employer]    PT Start Time 7253    PT Stop Time 1443    PT Time Calculation (min) 40 min    Equipment Utilized During Treatment Gait belt    Activity Tolerance Patient tolerated treatment well;Patient limited by pain    Behavior During Therapy WFL for tasks assessed/performed           Past Medical History:  Diagnosis Date  . Arthritis   . Bladder stone 10/26/2017  . BPH (benign prostatic hyperplasia)    takes Flomax daily  . CAD (coronary artery disease)    a. prior LAD stenting;  b. 06/2014 NSTEMI/PCI: LM nl, LAD 30p, 27mISR (3.0x20 Promus DES), LCX nl, RCA nondom, nl, EF 25-30% ant, apical, dist inf [place]  . Cataracts, bilateral    immature  . Chronic systolic CH[place] ([place]estive heart [place]) [place] 12/31/2014  . DVT (deep venous thrombosis) (HCC)    RECURRENT left leg  . Dyslipidemia   . GERD (gastroesophageal reflux disease)   . Gout   . History of kidney stones   . Hypertension   . Ischemic cardiomyopathy 07/06/2014  . Lupus anticoagulant positive   . [place]  . [place]  . S/P drug eluting coronary stent placement, 07/06/14, Promus 07/07/2014  . Sleep apnea    biPaP  . [place]   after falling and hitting his head    Past Surgical History:  Procedure Laterality Date  .  COLONOSCOPY    . EP IMPLANTABLE DEVICE [name] 11/27/2014   St. Jude Medical Fortify Assura VR  implanted by Dr ARayann Hemanfor primary prevention  . LEFT HEART CATH AND CORONARY ANGIOGRAPHY [name] 07/29/2018   Procedure: LEFT HEART CATH AND CORONARY ANGIOGRAPHY;  Surgeon: CSherren Mocha MD;  [facility];  Service: Cardiovascular;  Laterality: [name];  . LEFT HEART CATH AND CORONARY ANGIOGRAPHY [name] 08/14/2019   Procedure: LEFT HEART CATH AND CORONARY ANGIOGRAPHY;  Surgeon: KTroy Sine MD;  [facility];  Service: Cardiovascular;  Laterality: [name];  . LEFT HEART CATHETERIZATION WITH CORONARY ANGIOGRAM [name] 07/06/2014   Procedure: LEFT HEART CATHETERIZATION WITH CORONARY ANGIOGRAM;  Surgeon: Peter M JMartinique MD;  [facility];  Service: Cardiovascular;  Laterality: [name];  . PERCUTANEOUS CORONARY STENT INTERVENTION (PCI-S) Right 07/06/2014   Procedure: PERCUTANEOUS CORONARY STENT INTERVENTION (PCI-S);  Surgeon: Peter M JMartinique MD;  [facility];  Service: Cardiovascular;  Laterality: Right;  . SHOULDER ARTHROSCOPY WITH SUBACROMIAL DECOMPRESSION Left 03/05/2016   Procedure: SHOULDER ARTHROSCOPY ROTATOR CUFF REPAIR WITH SUBACROMIAL DECOMPRESSION;  Surgeon: JTania Ade MD;  [place]  Service: Orthopedics;  Laterality: Left;  SHOULDER ARTHROSCOPY ROTATOR CUFF REPAIR WITH SUBACROMIAL DECOMPRESSION  . SHOULDER CLOSED REDUCTION Left 11/29/2015   Procedure: CLOSED REDUCTION SHOULDER;  Surgeon: JTania Ade MD;  Location: [place];  Service: Orthopedics;  Laterality: Left;  . TRANSURETHRAL RESECTION OF PROSTATE [name] 02/24/2018   Procedure: TRANSURETHRAL RESECTION OF THE PROSTATE (TURP);  Surgeon: Irine Seal, MD;  [facility];  Service: Urology;  Laterality: [name];  . WRIST SURGERY Bilateral    plates and screws     There were no vitals filed for this visit.   Subjective Assessment - 10/09/19 1402    Subjective Doing pretty well, but is having some trouble  walking today d/t LBP. Will be receiving an injection on Wednesday. Still having trouble walking without a cane and getting out of his recliner, but does feel a little stronger.    Pertinent History DVT LLE 20 years ago, unstable angina (hospitalized 2 weeks ago), 2 MI's, scoliosis    Patient Stated Goals Pt would like to get better at getting up off of the floor and be steadier when he is walking.    Currently in Pain? Yes    Pain Score 2     Pain Location Back    Pain Orientation Lower    Pain Descriptors / Indicators Constant    Pain Type Chronic pain              [facility] PT Assessment - 10/09/19 0001      Assessment   Medical Diagnosis Progressive Weakness    Referring Provider (PT) Leanor Kail, [place]    Onset Date/Surgical Date 06/28/19      Standardized Balance Assessment   Five times sit to stand comments  trial 1 23.92 sec, trial 2 25.46 sec without UE support      Berg Balance Test   Sit to Stand Able to stand without using hands and stabilize independently    Standing Unsupported Able to stand safely 2 minutes    Sitting with Back Unsupported but Feet Supported on Floor or Stool Able to sit safely and securely 2 minutes    Stand to Sit Sits safely with minimal use of hands    Transfers Able to transfer safely, minor use of hands    Standing Unsupported with Eyes Closed Able to stand 10 seconds safely    Standing Unsupported with Feet Together Able to place feet together independently and stand 1 minute safely    From Standing, Reach Forward with Outstretched Arm Can reach forward >5 cm safely (2")    From Standing Position, Pick up Object from Floor Able to pick up shoe safely and easily    From Standing Position, Turn to Look Behind Over each Shoulder Looks behind from both sides and weight shifts well    Turn 360 Degrees Able to turn 360 degrees safely but slowly    Standing Unsupported, Alternately Place Feet on Step/Stool Able to stand independently and complete 8  steps >20 seconds    Standing Unsupported, One Foot in [employer] balance while stepping or standing    Standing on One Leg Tries to lift leg/unable to hold 3 seconds but remains standing independently    Total Score 44      Timed Up and Go Test   TUG Comments 18.76 sec with SPC, 17.58 sec without AD                         [facility] Adult PT Treatment/Exercise - 10/09/19 0001      Knee/Hip Exercises: Aerobic   Nustep L5 x 6 min (UEs/LEs)      Knee/Hip Exercises: Standing  Functional Squat 1 set;10 reps    Functional Squat Limitations cues to "lead with bottom"                   PT Education - 10/09/19 1443    Education Details discussion on objective progress and remaining impairments    Person(s) Educated Patient    Methods Explanation;Demonstration;Tactile cues;Verbal cues;Handout    Comprehension Verbalized understanding;Returned demonstration            PT Short Term Goals - 10/09/19 1453      PT SHORT TERM GOAL #1   Title Pt will be I and compliant with initial HEP.    Time 1    Period Weeks    Status Achieved    Target Date 09/04/19      PT SHORT TERM GOAL #2   Title Pt will decrease 5XSST time to 15 seconds or less.    Baseline 17s    Time 2    Period Weeks    Status On-going   10/09/19: 5xSTS trial 1 23.92 sec, trial 2 25.46 sec without UE support   Target Date 10/23/19      PT SHORT TERM GOAL #3   Title Pt will increase BERG score to at least 46/54.    Baseline 41/54    Time 2    Period Weeks    Status On-going   10/09/19: 44/46   Target Date 10/23/19             PT Long Term Goals - 10/09/19 1533      PT LONG TERM GOAL #1   Title Pt will decrease 5XSST time to 12.6s or less, in order to decrease fall risk to age bracket norm.    Baseline 17s    Time 4    Period Weeks    Status On-going   10/09/19: 5xSTS trial 1 23.92 sec, trial 2 25.46 sec without UE support   Target Date 11/06/19      PT LONG TERM GOAL #2   Title  Pt will improve BERG score to >48/54, indicating significant improvement and decrease in fall risk.    Baseline 41/54    Time 4    Period Weeks    Status On-going   10/09/19: 44/56   Target Date 11/06/19      PT LONG TERM GOAL #3   Title Pt will perform TUG in 13.5 seconds or less to indicate decreased risk of falls.    Baseline to be completed at 2nd visit    Time 4    Period Weeks    Status Achieved   10/09/19: TUG 18.76 sec with SPC, 17.58 sec without AD   Target Date 11/06/19      PT LONG TERM GOAL #4   Title Pt will perform 10 squats with good form and posture with no UE support.    Time 4    Period Weeks    Status Partially Met   requiring minor cues to shift weight posteriorly   Target Date 11/06/19      PT LONG TERM GOAL #5   Title Patient to report 75% improvement in confidence to transfer from floor with/without UE use.    Time 4    Period Weeks    Status New    Target Date 11/06/19                 Plan - 10/09/19 1452    Clinical Impression Statement Patient reporting continued trouble  walking without a cane and getting out of his recliner, but does feel a little stronger since starting therapy. Balance testing today revealed increased time required to complete both TUG and 5xSTS testing. This is likely d/t increased pain levels in LB today, as patient today with much more evident L lateral trunk lean and collapse onto L LE with ambulation without SPC. Patient did however demonstrate improved berg score, nearly meeting goal today. Discussed patient's remaining goals and challenges , thus updated goals with floor transfer as patient reports fear of transferring without a surface to push up from. Ended session without further complaints. Patient demonstrating progress, however with today's measurements limited by LBP. Would benefit from continued skilled PT services 2x/week for 4 weeks to address goals.    Comorbidities Arthritis, CHF, HTN, DVT, Dyslipidemia, Unstable  Angina, CAD    Rehab Potential Good    PT Frequency 2x / week    PT Duration 6 weeks    PT Treatment/Interventions ADLs/Self Care Home Management;Moist Heat;Iontophoresis '4mg'$ /ml Dexamethasone;Electrical Stimulation;Cryotherapy;Vasopneumatic Device;Passive range of motion;Dry needling;Energy conservation;Taping;Compression bandaging;Manual techniques;Patient/family education;Therapeutic exercise;Gait training;Stair training;Functional mobility training;Therapeutic activities;Balance training;Neuromuscular re-education    PT Next Visit Plan progress strength/endurance and balance, gait with and without SPC    Consulted and Agree with Plan of Care Patient           Patient will benefit from skilled therapeutic intervention in order to improve the following deficits and impairments:  Decreased activity tolerance, Decreased balance, Decreased endurance, Abnormal gait, Decreased mobility, Impaired flexibility, Decreased strength, Impaired UE functional use, Postural dysfunction, Pain, Difficulty walking, Cardiopulmonary status limiting activity, Impaired perceived functional ability, Improper body mechanics, Impaired tone  Visit Diagnosis: Muscle weakness (generalized)  Difficulty in walking, not elsewhere classified  Abnormal posture     Problem List Patient Active Problem List   Diagnosis Date Noted  . Hypokalemia   . OSA on CPAP   . Chest pain 08/11/2019  . Precordial chest pain   . Unstable angina ([place]) 07/26/2018  . BPH with urinary obstruction 02/24/2018  . Renal hemorrhage, right 10/26/2017  . S/P left rotator cuff repair 03/05/2016  . Anterior dislocation of left shoulder 11/29/2015  . History of implantable cardioverter-defibrillator (ICD) placement 03/16/2015  . ICD (implantable cardioverter-defibrillator) in place   . Chronic systolic heart failure ([place]) 12/31/2014  . Chronic systolic dysfunction of left ventricle 11/27/2014  . S/P drug eluting coronary stent placement,  mLAD 07/06/14, Promus 07/07/2014  . At risk for sudden cardiac death, EF 25-30% with PVCs, with lifevest 07/07/2014  . Coronary artery disease involving native coronary artery of native heart with unstable angina pectoris ([place])   . Cardiomyopathy, ischemic   . History of DVT (deep vein thrombosis), on coumadin   . NSTEMI (non-ST elevated myocardial infarction) ([place]) 07/05/2014  . CAD (coronary artery disease)   . Recurrent acute deep vein thrombosis (DVT) of lower extremity ([place])   . Dyslipidemia   . GERD (gastroesophageal reflux disease)   . Anterior myocardial infarction ([place])   . Lupus anticoagulant positive      Janene Harvey, PT, DPT 10/09/19 3:41 PM   [facility] [address]  [place], [place], [address] Phone: [phone]   Fax:  [phone]  Name: SAMIR ISHAQ MRN: [id number] Date of Birth: 1945/11/15

## 2019-10-10 DIAGNOSIS — M549 Dorsalgia, unspecified: Secondary | ICD-10-CM | POA: Diagnosis not present

## 2019-10-10 DIAGNOSIS — Z7901 Long term (current) use of anticoagulants: Secondary | ICD-10-CM | POA: Diagnosis not present

## 2019-10-10 DIAGNOSIS — I82409 Acute embolism and thrombosis of unspecified deep veins of unspecified lower extremity: Secondary | ICD-10-CM | POA: Diagnosis not present

## 2019-10-10 DIAGNOSIS — I10 Essential (primary) hypertension: Secondary | ICD-10-CM | POA: Diagnosis not present

## 2019-10-11 DIAGNOSIS — M48062 Spinal stenosis, lumbar region with neurogenic claudication: Secondary | ICD-10-CM | POA: Diagnosis not present

## 2019-10-11 DIAGNOSIS — M5416 Radiculopathy, lumbar region: Secondary | ICD-10-CM | POA: Diagnosis not present

## 2019-10-12 ENCOUNTER — Encounter (HOSPITAL_COMMUNITY): Payer: Self-pay | Admitting: Emergency Medicine

## 2019-10-12 ENCOUNTER — Emergency Department (HOSPITAL_COMMUNITY): Payer: PPO

## 2019-10-12 ENCOUNTER — Observation Stay (HOSPITAL_COMMUNITY)
Admission: EM | Admit: 2019-10-12 | Discharge: 2019-10-13 | Disposition: A | Discharge status: Home / Self Care | Payer: PPO | Attending: Internal Medicine | Admitting: Internal Medicine

## 2019-10-12 ENCOUNTER — Other Ambulatory Visit: Payer: Self-pay

## 2019-10-12 DIAGNOSIS — D72829 Elevated white blood cell count, unspecified: Secondary | ICD-10-CM

## 2019-10-12 DIAGNOSIS — W010XXA Fall on same level from slipping, tripping and stumbling without subsequent striking against object, initial encounter: Secondary | ICD-10-CM

## 2019-10-12 DIAGNOSIS — M47812 Spondylosis without myelopathy or radiculopathy, cervical region: Secondary | ICD-10-CM

## 2019-10-12 DIAGNOSIS — S0001XA Abrasion of scalp, initial encounter: Secondary | ICD-10-CM

## 2019-10-12 DIAGNOSIS — N179 Acute kidney failure, unspecified: Secondary | ICD-10-CM

## 2019-10-12 DIAGNOSIS — S065X9A Traumatic subdural hemorrhage with loss of consciousness of unspecified duration, initial encounter: Secondary | ICD-10-CM

## 2019-10-12 DIAGNOSIS — M5416 Radiculopathy, lumbar region: Secondary | ICD-10-CM

## 2019-10-12 DIAGNOSIS — E041 Nontoxic single thyroid nodule: Secondary | ICD-10-CM

## 2019-10-12 DIAGNOSIS — Y998 Other external cause status: Secondary | ICD-10-CM | POA: Diagnosis not present

## 2019-10-12 DIAGNOSIS — Z20822 Contact with and (suspected) exposure to covid-19: Secondary | ICD-10-CM | POA: Diagnosis not present

## 2019-10-12 DIAGNOSIS — S0003XA Contusion of scalp, initial encounter: Secondary | ICD-10-CM | POA: Diagnosis not present

## 2019-10-12 DIAGNOSIS — S199XXA Unspecified injury of neck, initial encounter: Secondary | ICD-10-CM | POA: Diagnosis not present

## 2019-10-12 DIAGNOSIS — I6201 Nontraumatic acute subdural hemorrhage: Secondary | ICD-10-CM | POA: Diagnosis not present

## 2019-10-12 DIAGNOSIS — Y9389 Activity, other specified: Secondary | ICD-10-CM | POA: Diagnosis not present

## 2019-10-12 DIAGNOSIS — S065XAA Traumatic subdural hemorrhage with loss of consciousness status unknown, initial encounter: Secondary | ICD-10-CM

## 2019-10-12 DIAGNOSIS — R58 Hemorrhage, not elsewhere classified: Secondary | ICD-10-CM | POA: Diagnosis not present

## 2019-10-12 DIAGNOSIS — Z79899 Other long term (current) drug therapy: Secondary | ICD-10-CM | POA: Insufficient documentation

## 2019-10-12 DIAGNOSIS — G319 Degenerative disease of nervous system, unspecified: Secondary | ICD-10-CM | POA: Diagnosis not present

## 2019-10-12 DIAGNOSIS — Y9289 Other specified places as the place of occurrence of the external cause: Secondary | ICD-10-CM | POA: Insufficient documentation

## 2019-10-12 DIAGNOSIS — I959 Hypotension, unspecified: Secondary | ICD-10-CM | POA: Diagnosis not present

## 2019-10-12 DIAGNOSIS — R000 Tachycardia, unspecified: Secondary | ICD-10-CM | POA: Diagnosis not present

## 2019-10-12 DIAGNOSIS — R9431 Abnormal electrocardiogram [ECG] [EKG]: Secondary | ICD-10-CM | POA: Diagnosis not present

## 2019-10-12 DIAGNOSIS — Z7952 Long term (current) use of systemic steroids: Secondary | ICD-10-CM | POA: Insufficient documentation

## 2019-10-12 DIAGNOSIS — W19XXXA Unspecified fall, initial encounter: Secondary | ICD-10-CM | POA: Diagnosis not present

## 2019-10-12 DIAGNOSIS — S065X0A Traumatic subdural hemorrhage without loss of consciousness, initial encounter: Secondary | ICD-10-CM | POA: Diagnosis not present

## 2019-10-12 DIAGNOSIS — S0990XA Unspecified injury of head, initial encounter: Secondary | ICD-10-CM | POA: Diagnosis present

## 2019-10-12 LAB — COMPREHENSIVE METABOLIC PANEL
ALT: 18 U/L (ref 0–44)
AST: 30 U/L (ref 15–41)
Albumin: 3.6 g/dL (ref 3.5–5.0)
Alkaline Phosphatase: 38 U/L (ref 38–126)
Anion gap: 11 (ref 5–15)
BUN: 22 mg/dL (ref 8–23)
CO2: 24 mmol/L (ref 22–32)
Calcium: 10.1 mg/dL (ref 8.9–10.3)
Chloride: 106 mmol/L (ref 98–111)
Creatinine, Ser: 1.34 mg/dL — ABNORMAL HIGH (ref 0.61–1.24)
GFR calc Af Amer: 60 mL/min — ABNORMAL LOW (ref >60)
GFR calc non Af Amer: 51 mL/min — ABNORMAL LOW (ref >60)
Glucose, Bld: 168 mg/dL — ABNORMAL HIGH (ref 70–99)
Potassium: 3.6 mmol/L (ref 3.5–5.1)
Sodium: 141 mmol/L (ref 135–145)
Total Bilirubin: 0.7 mg/dL (ref 0.3–1.2)
Total Protein: 6.4 g/dL — ABNORMAL LOW (ref 6.5–8.1)

## 2019-10-12 LAB — SARS CORONAVIRUS 2 BY RT PCR (HOSPITAL ORDER, PERFORMED IN ~~LOC~~ HOSPITAL LAB): SARS Coronavirus 2: NEGATIVE

## 2019-10-12 LAB — CBC
HCT: 43 % (ref 39.0–52.0)
Hemoglobin: 14.9 g/dL (ref 13.0–17.0)
MCH: 30.8 pg (ref 26.0–34.0)
MCHC: 34.7 g/dL (ref 30.0–36.0)
MCV: 89 fL (ref 80.0–100.0)
Platelets: 221 10*3/uL (ref 150–400)
RBC: 4.83 MIL/uL (ref 4.22–5.81)
RDW: 14.8 % (ref 11.5–15.5)
WBC: 20.1 10*3/uL — ABNORMAL HIGH (ref 4.0–10.5)
nRBC: 0 % (ref 0.0–0.2)

## 2019-10-12 LAB — PROTIME-INR
INR: 1.2 (ref 0.8–1.2)
Prothrombin Time: 14.3 seconds (ref 11.4–15.2)

## 2019-10-12 LAB — CBG MONITORING, ED: Glucose-Capillary: 157 mg/dL — ABNORMAL HIGH (ref 70–99)

## 2019-10-12 MED ORDER — ONDANSETRON HCL 4 MG/2ML IJ SOLN
4.0000 mg | Freq: Once | INTRAMUSCULAR | Status: AC
  Administered 2019-10-12: 4 mg via INTRAVENOUS
  Filled 2019-10-12: qty 2

## 2019-10-12 MED ORDER — SODIUM CHLORIDE 0.9 % IV BOLUS
500.0000 mL | Freq: Once | INTRAVENOUS | Status: AC
  Administered 2019-10-12: 500 mL via INTRAVENOUS

## 2019-10-12 MED ORDER — SODIUM CHLORIDE 0.9 % IV SOLN: INTRAVENOUS | Status: AC

## 2019-10-12 MED ORDER — ACETAMINOPHEN 650 MG RE SUPP: 650.0000 mg | Freq: Four times a day (QID) | RECTAL | Status: DC | PRN

## 2019-10-12 MED ORDER — LEVETIRACETAM 500 MG PO TABS
500.0000 mg | ORAL_TABLET | Freq: Two times a day (BID) | ORAL | Status: DC
  Administered 2019-10-12 – 2019-10-13 (×3): 500 mg via ORAL
  Filled 2019-10-12 (×3): qty 1

## 2019-10-12 MED ORDER — HYDROMORPHONE HCL 1 MG/ML IJ SOLN
0.5000 mg | Freq: Once | INTRAMUSCULAR | Status: AC
  Administered 2019-10-12: 0.5 mg via INTRAVENOUS
  Filled 2019-10-12: qty 1

## 2019-10-12 MED ORDER — ALLOPURINOL 100 MG PO TABS
150.0000 mg | ORAL_TABLET | Freq: Every day | ORAL | Status: DC
  Administered 2019-10-12 – 2019-10-13 (×2): 150 mg via ORAL
  Filled 2019-10-12: qty 2
  Filled 2019-10-12: qty 0.5

## 2019-10-12 MED ORDER — EZETIMIBE 10 MG PO TABS
10.0000 mg | ORAL_TABLET | Freq: Every day | ORAL | Status: DC
  Administered 2019-10-12 – 2019-10-13 (×2): 10 mg via ORAL
  Filled 2019-10-12 (×2): qty 1

## 2019-10-12 MED ORDER — PANTOPRAZOLE SODIUM 40 MG PO TBEC
40.0000 mg | DELAYED_RELEASE_TABLET | Freq: Every day | ORAL | Status: DC
  Administered 2019-10-12 – 2019-10-13 (×2): 40 mg via ORAL
  Filled 2019-10-12 (×2): qty 1

## 2019-10-12 MED ORDER — SACUBITRIL-VALSARTAN 24-26 MG PO TABS
1.0000 | ORAL_TABLET | Freq: Two times a day (BID) | ORAL | Status: DC
  Administered 2019-10-12 – 2019-10-13 (×2): 1 via ORAL
  Filled 2019-10-12 (×3): qty 1

## 2019-10-12 MED ORDER — ACETAMINOPHEN 325 MG PO TABS
650.0000 mg | ORAL_TABLET | Freq: Four times a day (QID) | ORAL | Status: DC | PRN
  Administered 2019-10-13: 650 mg via ORAL
  Filled 2019-10-12: qty 2

## 2019-10-12 NOTE — ED Provider Notes (Signed)
[facility] EMERGENCY DEPARTMENT Provider Note   CSN: [id number] Arrival date & time: 10/12/19  1113     History Chief Complaint  Patient presents with  . Level 2 Trauma    Kevin Hart is a 74 y.o. male.  Patient s/p fall this AM just pta. Was outside, walking, states chronic problems with his low back caused him to fall back (indicates currently being evaluated for same by neurology, recent dx lumbar neuritis and epidural injection), hit head, no loc but dazed, abrasion/lac to scalp with minimal bleeding. Post hitting head c/o dull headache, moderate. Is on coumadin but recently holding due to getting epidural injection for his back pain yesterday. Denies other abnormal bleeding. Currently lightheaded when sits up or stands - denies feeling faint or lightheaded prior to fall and hitting head. Has not eaten/drank anything yet today. Denies change in speech or vision. No new numbness or weakness. No fever or chills. Denies chest pain. No palpitations. No sob or worsening doe. No abd pain or nvd. No dysuria or gu c/o.   The history is provided by the patient and the EMS personnel.       History reviewed. No pertinent past medical history.  There are no problems to display for this patient.   History reviewed. No pertinent surgical history.     History reviewed. No pertinent family history.  Social History   Tobacco Use  . Smoking status: Not on file  Substance Use Topics  . Alcohol use: Not on file  . Drug use: Not on file    Home Medications Prior to Admission medications   Medication Sig Start Date End Date Taking? Authorizing Provider  allopurinol (ZYLOPRIM) 300 MG tablet Take 150 mg by mouth 2 (two) times daily. 09/19/19   [provider]  ASPIRIN LOW DOSE 81 MG chewable tablet Chew 81 mg by mouth daily. 08/15/19   [provider]  ENTRESTO 24-26 MG Take 1 tablet by mouth 2 (two) times daily. 10/02/19   [provider]    ezetimibe (ZETIA) 10 MG tablet Take 10 mg by mouth daily. 08/15/19   [provider]  furosemide (LASIX) 40 MG tablet Take 40 mg by mouth daily. 10/02/19   [provider]  LIVALO 4 MG TABS Take 1 tablet by mouth at bedtime. 09/19/19   [provider]  pantoprazole (PROTONIX) 40 MG tablet Take 40 mg by mouth daily. 09/19/19   [provider]  potassium chloride SA (KLOR-CON) 20 MEQ tablet Take 20 mEq by mouth daily. 08/30/19   [provider]  propranolol (INDERAL) 20 MG tablet Take 20 mg by mouth 2 (two) times daily. 09/19/19   [provider]  rosuvastatin (CRESTOR) 20 MG tablet Take 20 mg by mouth daily. 06/16/19   [provider]  warfarin (COUMADIN) 5 MG tablet Take 5 mg by mouth daily. 08/16/19   [provider]    Allergies    Patient has no allergy information on record.  Review of Systems   Review of Systems  Constitutional: Negative for fever.  HENT: Negative for nosebleeds.   Eyes: Negative for visual disturbance.  Respiratory: Negative for cough and shortness of breath.   Cardiovascular: Negative for chest pain, palpitations and leg swelling.  Gastrointestinal: Negative for abdominal pain and blood in stool.  Genitourinary: Negative for flank pain and hematuria.  Musculoskeletal: Negative for neck pain.  Skin: Negative for rash.  Neurological: Positive for headaches. Negative for speech difficulty.  Headache post fall.   Hematological:       +anticoag therapy.   Psychiatric/Behavioral: Negative for confusion.    Physical Exam Updated Vital Signs BP (!) 134/76   Pulse 97   Temp 97.9 F (36.6 C) (Oral)   Resp 18   Ht 1.803 m (5\' 11" )   Wt (!) 97.5 kg   SpO2 97%   BMI 29.99 kg/m   Physical Exam Vitals and nursing note reviewed.  Constitutional:      Appearance: Normal appearance. He is well-developed.  HENT:     Head:     Comments: Contusion/abrasion to posterior scalp.     Nose: Nose  normal.     Mouth/Throat:     Mouth: Mucous membranes are moist.     Pharynx: Oropharynx is clear.  Eyes:     General: No scleral icterus.    Conjunctiva/sclera: Conjunctivae normal.     Pupils: Pupils are equal, round, and reactive to light.  Neck:     Vascular: No carotid bruit.     Trachea: No tracheal deviation.  Cardiovascular:     Rate and Rhythm: Normal rate and regular rhythm.     Pulses: Normal pulses.     Heart sounds: Normal heart sounds. No murmur heard.  No friction rub. No gallop.   Pulmonary:     Effort: Pulmonary effort is normal. No accessory muscle usage or respiratory distress.     Breath sounds: Normal breath sounds.  Chest:     Chest wall: No tenderness.  Abdominal:     General: Bowel sounds are normal. There is no distension.     Palpations: Abdomen is soft.     Tenderness: There is no abdominal tenderness. There is no guarding.     Comments: No abd contusion or bruising.   Genitourinary:    Comments: No cva tenderness. Musculoskeletal:        General: No tenderness.     Cervical back: Normal range of motion and neck supple. No rigidity.     Comments: Mid cervical tenderness, mild, otherwise, CTLS spine, non tender, aligned, no step off. Good rom bil extremities without pain or focal bony tenderness.   Skin:    General: Skin is warm and dry.     Findings: No rash.  Neurological:     Mental Status: He is alert.     Comments: Alert, speech clear. GCS 15. Motor intact bil, stre 5/5. Sens grossly intact bil.   Psychiatric:        Mood and Affect: Mood normal.     ED Results / Procedures / Treatments   Labs (all labs ordered are listed, but only abnormal results are displayed) Results for orders placed or performed during the hospital encounter of 10/12/19  Protime-INR  Result Value Ref Range   Prothrombin Time 14.3 11.4 - 15.2 seconds   INR 1.2 0.8 - 1.2  CBC  Result Value Ref Range   WBC 20.1 (H) 4.0 - 10.5 K/uL   RBC 4.83 4.22 - 5.81 MIL/uL     Hemoglobin 14.9 13.0 - 17.0 g/dL   HCT 43.0 39 - 52 %   MCV 89.0 80.0 - 100.0 fL   MCH 30.8 26.0 - 34.0 pg   MCHC 34.7 30.0 - 36.0 g/dL   RDW 14.8 11.5 - 15.5 %   Platelets 221 150 - 400 K/uL   nRBC 0.0 0.0 - 0.2 %  Comprehensive metabolic panel  Result Value Ref Range   Sodium 141 135 -  145 mmol/L   Potassium 3.6 3.5 - 5.1 mmol/L   Chloride 106 98 - 111 mmol/L   CO2 24 22 - 32 mmol/L   Glucose, Bld 168 (H) 70 - 99 mg/dL   BUN 22 8 - 23 mg/dL   Creatinine, Ser 1.34 (H) 0.61 - 1.24 mg/dL   Calcium 10.1 8.9 - 10.3 mg/dL   Total Protein 6.4 (L) 6.5 - 8.1 g/dL   Albumin 3.6 3.5 - 5.0 g/dL   AST 30 15 - 41 U/L   ALT 18 0 - 44 U/L   Alkaline Phosphatase 38 38 - 126 U/L   Total Bilirubin 0.7 0.3 - 1.2 mg/dL   GFR calc non Af Amer 51 (L) >60 mL/min   GFR calc Af Amer 60 (L) >60 mL/min   Anion gap 11 5 - 15  CBG monitoring, ED  Result Value Ref Range   Glucose-Capillary 157 (H) 70 - 99 mg/dL   CT HEAD WO CONTRAST  Result Date: 10/12/2019 CLINICAL DATA:  [place]. Hit head. EXAM: CT HEAD WITHOUT CONTRAST TECHNIQUE: Contiguous axial images were obtained from the base of the skull through the vertex without intravenous contrast. COMPARISON:  05/22/2019 FINDINGS: Brain: Stable age related cerebral atrophy, ventriculomegaly and periventricular white matter disease. No CT findings for acute hemispheric infarction or intracranial hemorrhage. No mass lesions. The brainstem and cerebellum are normal. There is a small subdural hematoma noted in the left occipital/posterior parietal region. This measures a maximum of 4.5 mm in diameter and there is no mass effect. Vascular: Stable vascular calcifications. No aneurysm or hyperdense vessels. Skull: No skull fracture or bone lesions. Sinuses/Orbits: The paranasal sinuses and mastoid air cells are clear. The globes are intact. Other: There is a left occipital/posterior parietal scalp hematoma but no underlying skull fracture. IMPRESSION: 1. Small left  occipital/posterior parietal subdural hematoma without mass effect. 2. Stable age related cerebral atrophy, ventriculomegaly and periventricular white matter disease. 3. Left occipital/posterior parietal scalp hematoma without underlying skull fracture. Electronically Signed   By: Marijo Sanes M.D.   On: 10/12/2019 12:00   CT CERVICAL SPINE WO CONTRAST  Result Date: 10/12/2019 CLINICAL DATA:  Fall, neck trauma EXAM: CT CERVICAL SPINE WITHOUT CONTRAST TECHNIQUE: Multidetector CT imaging of the cervical spine was performed without intravenous contrast. Multiplanar CT image reconstructions were also generated. COMPARISON:  05/22/2019 FINDINGS: Alignment: Facet joints are aligned without dislocation. Trace degenerative anterolisthesis of C4 on C5 is unchanged. Straightening of the cervical lordosis. Dens and lateral masses remain aligned. Skull base and vertebrae: No acute fracture. No primary bone lesion or focal pathologic process. Soft tissues and spinal canal: No prevertebral fluid or swelling. No visible canal hematoma. Disc levels: No appreciable interval progression of moderate cervical spondylosis most pronounced at C5-6 and C6-7. Bulky left-sided facet arthropathy at C2-3 and C3-4. Bilateral facet arthropathy with prominent uncovertebral spurring at C4-5. Prominent anterior endplate osteophytosis emanating from the anterior aspect of the lower cervical spine and visualized upper thoracic levels. Upper chest: Included lung apices are clear. Aortic atherosclerosis is noted. Other: Bilateral thyroid lobe nodules measuring up to 2.3 cm on the left and 1.7 cm on the right. IMPRESSION: 1. No acute fracture or traumatic listhesis of the cervical spine. 2. No appreciable interval progression of moderate multilevel cervical spondylosis most pronounced at C5-6 and C6-7. 3. Bilateral thyroid lobe nodules measuring up to 2.3 cm on the left and 1.7 cm on the right. Recommend thyroid US (ref: J Am Coll Radiol. 2015  Feb;12(2): 143-50). 4.  Aortic atherosclerosis. (ICD10-I70.0). Electronically Signed   By: Davina Poke D.O.   On: 10/12/2019 12:08    EKG EKG Interpretation  Date/Time:  Thursday October 12 2019 11:23:52 EDT Ventricular Rate:  97 PR Interval:  214 QRS Duration: 96 QT Interval:  350 QTC Calculation: 444 R Axis:   44 Text Interpretation: Sinus rhythm with 1st degree A-V block with frequent Premature ventricular complexes Low voltage QRS Nonspecific T wave abnormality Confirmed by Lajean Saver [phone]) on 10/12/2019 11:51:29 AM   Radiology CT HEAD WO CONTRAST  Result Date: 10/12/2019 CLINICAL DATA:  [place]. Hit head. EXAM: CT HEAD WITHOUT CONTRAST TECHNIQUE: Contiguous axial images were obtained from the base of the skull through the vertex without intravenous contrast. COMPARISON:  05/22/2019 FINDINGS: Brain: Stable age related cerebral atrophy, ventriculomegaly and periventricular white matter disease. No CT findings for acute hemispheric infarction or intracranial hemorrhage. No mass lesions. The brainstem and cerebellum are normal. There is a small subdural hematoma noted in the left occipital/posterior parietal region. This measures a maximum of 4.5 mm in diameter and there is no mass effect. Vascular: Stable vascular calcifications. No aneurysm or hyperdense vessels. Skull: No skull fracture or bone lesions. Sinuses/Orbits: The paranasal sinuses and mastoid air cells are clear. The globes are intact. Other: There is a left occipital/posterior parietal scalp hematoma but no underlying skull fracture. IMPRESSION: 1. Small left occipital/posterior parietal subdural hematoma without mass effect. 2. Stable age related cerebral atrophy, ventriculomegaly and periventricular white matter disease. 3. Left occipital/posterior parietal scalp hematoma without underlying skull fracture. Electronically Signed   By: Marijo Sanes M.D.   On: 10/12/2019 12:00   CT CERVICAL SPINE WO CONTRAST  Result Date:  10/12/2019 CLINICAL DATA:  Fall, neck trauma EXAM: CT CERVICAL SPINE WITHOUT CONTRAST TECHNIQUE: Multidetector CT imaging of the cervical spine was performed without intravenous contrast. Multiplanar CT image reconstructions were also generated. COMPARISON:  05/22/2019 FINDINGS: Alignment: Facet joints are aligned without dislocation. Trace degenerative anterolisthesis of C4 on C5 is unchanged. Straightening of the cervical lordosis. Dens and lateral masses remain aligned. Skull base and vertebrae: No acute fracture. No primary bone lesion or focal pathologic process. Soft tissues and spinal canal: No prevertebral fluid or swelling. No visible canal hematoma. Disc levels: No appreciable interval progression of moderate cervical spondylosis most pronounced at C5-6 and C6-7. Bulky left-sided facet arthropathy at C2-3 and C3-4. Bilateral facet arthropathy with prominent uncovertebral spurring at C4-5. Prominent anterior endplate osteophytosis emanating from the anterior aspect of the lower cervical spine and visualized upper thoracic levels. Upper chest: Included lung apices are clear. Aortic atherosclerosis is noted. Other: Bilateral thyroid lobe nodules measuring up to 2.3 cm on the left and 1.7 cm on the right. IMPRESSION: 1. No acute fracture or traumatic listhesis of the cervical spine. 2. No appreciable interval progression of moderate multilevel cervical spondylosis most pronounced at C5-6 and C6-7. 3. Bilateral thyroid lobe nodules measuring up to 2.3 cm on the left and 1.7 cm on the right. Recommend thyroid US (ref: J Am Coll Radiol. 2015 Feb;12(2): 143-50). 4. Aortic atherosclerosis. (ICD10-I70.0). Electronically Signed   By: Davina Poke D.O.   On: 10/12/2019 12:08    Procedures Procedures (including critical care time)  Medications Ordered in ED Medications  HYDROmorphone (DILAUDID) injection 0.5 mg (0.5 mg Intravenous Given 10/12/19 1253)  ondansetron (ZOFRAN) injection 4 mg (4 mg Intravenous  Given 10/12/19 1252)    ED Course  I have reviewed the triage vital signs and the nursing notes.  Pertinent  labs & imaging results that were available during my care of the patient were reviewed by me and considered in my medical decision making (see chart for details).    MDM Rules/Calculators/A&P                           Iv ns. Continuous pulse ox and monitor. Stat imaging and labs.   Tetanus is up to date this year.    Labs sent. Ecg.   Reviewed nursing notes and prior charts for additional history.   Initial labs reviewed/interpreted by me - inr 1.2.   CT reviewed/interpreted by me - small SDH - neurosurgery consulted.   Recheck pt, dull headache. No change in neuro exam. Dilaudid .5 mg iv. zofran iv.   icepack to sore area. Abrasion cleaned/sterile dressing.   WBC elevated. Pt denies fever or chills. Chest cta. abd soft nt. UA is pending.   NS evaluated in ED - indicates will manage non operatively, no need to reverse coumadin, rather hold it, repeat ct in AM, requests admit to medical service.   Trauma surgery also consulted, they indicate pt can be managed by NS/medicine.   Medicine service consulted for admission.   Final Clinical Impression(s) / ED Diagnoses Final diagnoses:  Fall from slip, trip, or stumble, initial encounter  SDH (subdural hematoma) ([place])  Lumbar neuritis  Cervical spondylosis  Right thyroid nodule  Left thyroid nodule  Abrasion, scalp w/o infection    Rx / DC Orders ED Discharge Orders    None       Lajean Saver, MD 10/12/19 1318

## 2019-10-12 NOTE — Consult Note (Signed)
Neurosurgery Consult  Referring physician: Dr. 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  He was a level 2 trauma.  Denies loss of consciousness.  His neighbor is accompanying him at the bedside.  He complains of a slight headache.  Denies numbness tingling or weakness.  Stated he had held his Coumadin recently for a lumbar steroid injection which he took his 1 dose of Coumadin yesterday otherwise that is all he is taken recently.  Pertinent family, medical and surgical history listed above.   Allergies: No Known Allergies  History reviewed. No pertinent family history.    Physical Exam:  Vital signs in last 24 hours: Temp:  [97.9 F (36.6 C)] 97.9 F (36.6 C) (07/22 1116) Pulse Rate:  [97] 97 (07/22 1116) Resp:  [18] 18 (07/22 1116) BP: (120-134)/(76-84) 134/76 (07/22 1116) SpO2:  [97 %] 97 % (07/22 1116) Weight:  [97.5 kg] 97.5 kg (07/22 1124)  Physical exam: Awake alert oriented x3 Pupils equally round reactive to light Face symmetric Sensory intact to light touch throughout Motor strength full throughout, limited exam in the left upper extremity shoulder because of history of shoulder injury. Follows commands x4. No drift noted in the right upper extremity, again could not check left upper extremity due to previous shoulder injury Abrasion to posterior scalp. GCS 15     Impression/Assessment:  1.  Left parietal occipital small subdural hematoma, traumatic, no mass-effect 2.  Ground-level fall   Plan:  At this time I recommend he be admitted overnight for neurologic monitoring.  I am ordering a repeat CAT scan for tomorrow morning for stability.  He will need Keppra 500mg  twice daily for 7 days.  I counseled him on holding his Coumadin  for likely 2 weeks depending on his follow-up CAT scans.  I do not recommend any surgical intervention at this time as the subdural hematoma is quite small but I do want to observe him to make sure there is no significant growth or change in his neurologic exam.   Thank you for allowing me to participate in this patient's care.  Please do not hesitate to call with questions or concerns.  Elwin Sleight, [facility] Cell: [phone]

## 2019-10-12 NOTE — ED Triage Notes (Signed)
Patient arrives to ED as a level 2 trauma with complaints of a fall. Patient states he lost his balance while walking up some outside stairs. Patient fell from the first step and fell backwards stricking his head on cement. Pt denies LOC. Patient complains of head, neck, and upper back pain. Patient states he is on blood thinners for past DVT.

## 2019-10-12 NOTE — ED Notes (Signed)
Attempted report x1. 

## 2019-10-12 NOTE — H&P (Addendum)
Date: 10/12/2019               Patient Name:  Kevin Hart MRN: [id number]  DOB: 27-Dec-1945 Age / Sex: 74 y.o., male   PCP: [facility] [place] Service: Internal Medicine Teaching Service              Attending Physician: Dr. Velna Ochs, MD    First Contact: Trina Ao, MS Pager: [phone]  Second Contact: Dr. Lonia Skinner Pager: [phone]       After Hours (After 5p/  First Contact Pager: [phone]  weekends / holidays): Second Contact Pager: [phone]   Chief Complaint: Subdural Hematoma  History of Present Illness:  Kevin Hart is a 74 y.o. 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   Patient states that yesterday he received an epidural steroid injection for chronic back pain. He was advised to not exert himself for the next few days, but he decided to take a walk today with his neighbor thinking that it would not be strenuous enough to cause any issues. When returning home, he attempted to climb his front steps when he states he felt very weak and his leg gave out, causing him to fall backwards and striking his head on the sidewalk. His neighbor witnessed the fall and witnessed a significant amount of bleeding from the back of his head. EMS was called who transferred patient to the ED.   In the ED, stat CT revealed left parietal occipital small subdural hematoma. Neurosurgery was consulted who concluded that he would not require surgical intervention and that management would proceed non operatively in coordination with IMTS.  Regarding the time just before the fall, he denies any symptoms including dizziness, palpitations, vision changes, and diaphoresis. He denies loss  of consciousness before or after the fall. He also denies numbness, weakness, nausea, vomiting, chest pain, or SOB. Currently, he endorses a headache that has been improving since receiving dilaudid in the ED. He also complains of mild dizziness upon shifting in bed.   Of note, his coumadin had been held for a few days prior to yesterday's epidural injection, and he had only taken a single dose this morning.   Meds:  Current Meds  Medication Sig  . allopurinol (ZYLOPRIM) 300 MG tablet Take 150 mg by mouth daily.   . ASPIRIN LOW DOSE 81 MG chewable tablet Chew 81 mg by mouth daily.  Marland Kitchen ENTRESTO 24-26 MG Take 1 tablet by mouth 2 (two) times daily.  Marland Kitchen ezetimibe (ZETIA) 10 MG tablet Take 10 mg by mouth daily.  . furosemide (LASIX) 40 MG tablet Take 40 mg by mouth daily.  Marland Kitchen LIVALO 4 MG TABS Take 4 mg by mouth at bedtime.   . Multiple Vitamin (ONE-A-DAY MENS PO) Take 1 tablet by mouth daily.  . pantoprazole (PROTONIX) 40 MG tablet Take 40 mg by mouth daily.  . potassium chloride SA (KLOR-CON) 20 MEQ tablet Take 20 mEq by mouth daily.  . propranolol (INDERAL) 20 MG tablet Take 20 mg by mouth 2 (two) times daily.  Marland Kitchen warfarin (COUMADIN) 5 MG tablet Take 5 mg by mouth daily.     Allergies:  Allergies as of 10/12/2019  . (No Known Allergies)   History reviewed. No pertinent past medical history.  Family History: Per chart review, his Father died of some form of an aneurysm.   Social History: Kevin Hart is retired and lives in a house with wife in [place], [place]. He previously worked in Estate manager/land agent and also spent fours years in [employer] during the [place] War. He reports 1 alcoholic beverage per month. He denies tobacco or drug use.    Review of Systems: A complete ROS was negative except as per HPI.   Physical Exam: Blood pressure (!) 119/54, pulse 73, temperature 97.8 F (36.6 C), temperature source Oral, resp. rate 18, height 5\' 11"  (1.803 m), weight (!) 97.5 kg, SpO2 97  %. Physical Exam Constitutional:      General: Laying in bed, in no acute distress, blood stains on shirt and bed sheet behind his head    Appearance: Not ill-appearing or diaphoretic.  HENT:     Head: Abrasion with underlying swelling over the posterior surface of the scalp along the middle occipitoparietal region.  Eyes:     General: No scleral icterus.    Extraocular Movements: Extraocular movements intact.     Conjunctiva/sclera: Conjunctivae normal.     Pupils: Pupils are equal, round, and reactive to light.  Neck:     Vascular: No carotid bruit.  Cardiovascular:     Rate and Rhythm: Normal rate and regular rhythm.     Heart sounds: No murmurs, rubs, or gallops.   Pulmonary:     Effort: Pulmonary effort is normal.     Breath sounds: Normal breath sounds. No wheezing, rhonchi or rales.  Abdominal:     General: Abdomen is flat. Bowel sounds are normal. There is no distension.     Palpations: Abdomen is soft.     Tenderness: There is no abdominal tenderness.  Musculoskeletal:        Extremities: No lower extremity edema Skin:    General: Skin is warm and dry. Abrasion over the posterior scalp as above. Neurological:     Mental Status: Patient is alert and oriented x3. Mental status is at baseline.     Cranial Nerves: PERRL 21mm -> 49mm, face is symmetric, facial sensation is intact to light touch symmetrically, face is symmetric with full strength, hearing is grossly intact to conversation, shoulder shrug is full and symmetric.    Motor: Motor strength is full throughout, appears 4/5 in left UE with history of shoulder injury, 5/5 in RUE. No pronator drift of the right UE. Patellar reflexes are 2+ and symmetric. Toes are downgoing.     Sensory: Sensation is intact to light touch throughout.       EKG: personally reviewed my interpretation is sinus rhythm with frequent premature ventricular complexes and low voltage QRS intervals.  CT Cervical Spine wo/ contranst: personally  reviewed my interpretation is no acute fracture of the cervical spine.   CT Head wo/ contranst: personally reviewed my interpretation is small left occipitoparietal subdural hematoma without mass effect, and a occipitoparietal scalp hematoma without underlying skull fracture.   Assessment & Plan by Problem: Active Problems:   Subdural hematoma (HCC)  JAYCEE PELZER is a 74 y.o. 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  Subdural hematoma: Patient with injury to his head following a backwards mechanical fall onto a sidewalk. Small subdural hematoma confirmed on CT in the posterior parietoccipital region. CT cervical spine negative for acute fracture. Of note, he had previously held his home Coumadin for multiple days for an epidural steroid injection that he received yesterday which likely reduced severity of the bleed. Neuro exam unremarkable other then chronic left upper extremity 4/5. On admission INR wnl at 1.2. Neurosurgery consulted, feel hematoma does not require surgical intervention and recommend non-operative management with holding Coumadin, overnight monitoring, and repeat CT head in the AM. Currently, patient endorses a mild headache that has improved since receiving Dilaudid in the ED. -Holding Coumadin -Per neurology, starting Keppra 500 mg BID x1 week, likely for post-TBI seizure prophylaxis  -As needed Tylenol for headache -Monitor overnight -Q4 neuro exam  -Repeat CT head tomorrow  Hx of DVTs, on Coumadin: Hx of positive Lupus anticoagulant:  Patient has been on Coumadin for hx of DVTs and positive lupus anticoagulant. Prior to his epidural injection yesterday, Coumadin had been stopped  to achieve a subtherapeutic INR preoperatively. INR on admission 1.2 and Neurosurgery recommend continue holding for 2 weeks in the setting of his subdural hematoma. Will need to closely monitor for signs of DVT or PE given his increased risk for thrombosis. Will need to have discussion about risks of holding coumadin in AM.  -Closely monitor for signs of DVT and PE on vitals and physical exam -F/u PT-INR  Mechanical fall secondary to low back weakness: Lumbar neuritis: Patient with hx of lumbar neuritis and chronic back pain, requiring an epidural steroid injection for pain control yesterday. Was advised to rest, but following a walk this morning, felt sudden low back weakness which he attributes to causing his fall. Concern for a non-mechanical fall is low as patient denied LOC, dizziness, chest pain, palpitations, vision changes, or N/V. His home medications have a low fall-risk as well. EKG unremarkable for a contributory arrhythmia.  -As needed Tylenol for pain as above.   Posterior suprascapular hematoma and abrasion: Hematoma visualized on CT head in the posterior parietoccipital region. Notable compressible swelling with overlaying abrasion on physical exam. No active bleeding. -Wound care per nursing -Reevaluate on physical exam in AM  Elevated creatinine c/f AKI: Per chart review, baseline around 1.0. No history of renal dysfunction. On admission, creatinine 1.34. He is euvolemic on exam. Ordered a 576mL bolus of NS. Will follow-up BMP.  -F/u BMP in AM   Elevated WBC; On admission 20.1. He is afebrile and denies infectious symptoms. UA pending. Concern for infection is low. Elevated white count may be elevated secondary to epidural steroid injection yesterday. -F/u CBC in AM -F/u pending UA   CAD s/p PCI: Patient denies chest pain. EKG unremarkable for acute ischemia.  -Holding home Aspirin in setting of intracranial bleed -Continue home Zetia 10 mg daily  Combined heart failure  (EF 25-30% in 07/2019): ICM s/p single-chamber ICD 2016: Hypertension:  Heart rate and blood pressures are stable within normal limits at this time. On lasix, and entresto at home. Euvolemic on exam. Will continue to monitor. -Continue home Entresto 24-26 mg BID -Hold home lasix in setting of AKI  Diet: NPO VTE: SCD  Fluids: none   Prior to Admission Living Arrangement: home Anticipated Discharge Location: home Barriers to Discharge: Overnight monitoring of TBI  Dispo: Admit patient to Inpatient with expected length of stay  greater than 2 midnights.  SignedTrina Ao, Medical Student 10/12/2019, 7:07 PM  Pager: [phone] After 5pm on weekdays and 1pm on weekends: On Call pager [phone]  Attestation for Student Documentation:  I personally was present and performed or re-performed the history, physical exam and medical decision-making activities of this service and have verified that the service and findings are accurately documented in the student's note.  Asencion Noble, MD 10/12/2019, 7:17 PM

## 2019-10-13 ENCOUNTER — Observation Stay (HOSPITAL_COMMUNITY): Payer: PPO

## 2019-10-13 DIAGNOSIS — S0003XA Contusion of scalp, initial encounter: Secondary | ICD-10-CM | POA: Diagnosis not present

## 2019-10-13 DIAGNOSIS — I7090 Unspecified atherosclerosis: Secondary | ICD-10-CM | POA: Diagnosis not present

## 2019-10-13 DIAGNOSIS — R531 Weakness: Secondary | ICD-10-CM | POA: Diagnosis not present

## 2019-10-13 DIAGNOSIS — I6200 Nontraumatic subdural hemorrhage, unspecified: Secondary | ICD-10-CM | POA: Diagnosis not present

## 2019-10-13 DIAGNOSIS — S065X0A Traumatic subdural hemorrhage without loss of consciousness, initial encounter: Secondary | ICD-10-CM | POA: Diagnosis not present

## 2019-10-13 DIAGNOSIS — S065X9A Traumatic subdural hemorrhage with loss of consciousness of unspecified duration, initial encounter: Secondary | ICD-10-CM | POA: Diagnosis not present

## 2019-10-13 DIAGNOSIS — G4733 Obstructive sleep apnea (adult) (pediatric): Secondary | ICD-10-CM | POA: Diagnosis not present

## 2019-10-13 LAB — COMPREHENSIVE METABOLIC PANEL
ALT: 14 U/L (ref 0–44)
AST: 16 U/L (ref 15–41)
Albumin: 2.9 g/dL — ABNORMAL LOW (ref 3.5–5.0)
Alkaline Phosphatase: 33 U/L — ABNORMAL LOW (ref 38–126)
Anion gap: 11 (ref 5–15)
BUN: 17 mg/dL (ref 8–23)
CO2: 24 mmol/L (ref 22–32)
Calcium: 9.3 mg/dL (ref 8.9–10.3)
Chloride: 109 mmol/L (ref 98–111)
Creatinine, Ser: 1.11 mg/dL (ref 0.61–1.24)
GFR calc Af Amer: >60 mL/min (ref >60)
GFR calc non Af Amer: >60 mL/min (ref >60)
Glucose, Bld: 159 mg/dL — ABNORMAL HIGH (ref 70–99)
Potassium: 2.9 mmol/L — ABNORMAL LOW (ref 3.5–5.1)
Sodium: 144 mmol/L (ref 135–145)
Total Bilirubin: 0.9 mg/dL (ref 0.3–1.2)
Total Protein: 5.4 g/dL — ABNORMAL LOW (ref 6.5–8.1)

## 2019-10-13 LAB — CBC
HCT: 39.1 % (ref 39.0–52.0)
Hemoglobin: 13.1 g/dL (ref 13.0–17.0)
MCH: 30.7 pg (ref 26.0–34.0)
MCHC: 33.5 g/dL (ref 30.0–36.0)
MCV: 91.6 fL (ref 80.0–100.0)
Platelets: 177 10*3/uL (ref 150–400)
RBC: 4.27 MIL/uL (ref 4.22–5.81)
RDW: 15.6 % — ABNORMAL HIGH (ref 11.5–15.5)
WBC: 10.5 10*3/uL (ref 4.0–10.5)
nRBC: 0 % (ref 0.0–0.2)

## 2019-10-13 LAB — URINALYSIS, ROUTINE W REFLEX MICROSCOPIC
Bacteria, UA: NONE SEEN
Bilirubin Urine: NEGATIVE
Glucose, UA: 150 mg/dL — AB
Hgb urine dipstick: NEGATIVE
Ketones, ur: NEGATIVE mg/dL
Nitrite: NEGATIVE
Protein, ur: NEGATIVE mg/dL
Specific Gravity, Urine: 1.019 (ref 1.005–1.030)
pH: 5 (ref 5.0–8.0)

## 2019-10-13 LAB — PROTIME-INR
INR: 1.4 — ABNORMAL HIGH (ref 0.8–1.2)
Prothrombin Time: 16.3 seconds — ABNORMAL HIGH (ref 11.4–15.2)

## 2019-10-13 MED ORDER — LEVETIRACETAM 500 MG PO TABS: 500.0000 mg | ORAL_TABLET | Freq: Two times a day (BID) | ORAL | 0 refills | Status: AC

## 2019-10-13 MED ORDER — POTASSIUM CHLORIDE CRYS ER 20 MEQ PO TBCR
40.0000 meq | EXTENDED_RELEASE_TABLET | ORAL | Status: AC
  Administered 2019-10-13 (×2): 40 meq via ORAL
  Filled 2019-10-13 (×2): qty 2

## 2019-10-13 MED ORDER — LEVETIRACETAM 500 MG PO TABS: 500.0000 mg | ORAL_TABLET | Freq: Two times a day (BID) | ORAL | 0 refills | Status: DC

## 2019-10-13 NOTE — Progress Notes (Signed)
NEUROSURGERY PROGRESS NOTE  S: No issues overnight. Has not gotten out of bed yet, would like to be seen by PT  O: EXAM:  BP 119/76 (BP Location: Right Arm)   Pulse 71   Temp 97.6 F (36.4 C) (Oral)   Resp 20   Ht 5\' 11"  (1.803 m)   Wt (!) 107.9 kg   SpO2 95%   BMI 33.18 kg/m   Awake, alert, oriented x2 Speech fluent, appropriate  CN grossly intact  5/5 BUE/BLE (LUE shoulder limited from previous shoulder injury) No drift  ASSESSMENT:  1.  Left parietal occipital small subdural hematoma, traumatic, no mass-effect 2.  Ground-level fall  PLAN: - hold aspirin and coumadin for 10 more days due to acuity of fall and SDH - no nsx intervention recommended, repeat CT this am is stable - ok for DC from Nsx standpoint, I gave rx for keppra x7days total and would like repeat CT brain in 2 weeks with followup in my office   Thank you for allowing me to participate in this patient's care.  Please do not hesitate to call with questions or concerns.   Elwin Sleight, [facility] Cell: [phone]

## 2019-10-13 NOTE — Discharge Summary (Addendum)
Name: Kevin Hart MRN: [id number] DOB: 02/18/1946 74 y.o. PCP: [facility]  Date of Admission: 10/12/2019 11:13 AM Date of Discharge: 10/13/2019 Attending Physician: Dr. Velna Ochs  Discharge Diagnosis: Subdural Hematoma Mechanical vs. Non-mechanical Fall Hx of DVTs, on Coumadin Hx of positive Lupus anticoagulant AKI Hypokalemia  Discharge Medications: Allergies as of 10/13/2019   No Known Allergies      Medication List     STOP taking these medications    Aspirin Low Dose 81 MG chewable tablet Generic drug: aspirin   warfarin 5 MG tablet Commonly known as: COUMADIN       TAKE these medications    allopurinol 300 MG tablet Commonly known as: ZYLOPRIM Take 150 mg by mouth daily.   Entresto 24-26 MG Generic drug: sacubitril-valsartan Take 1 tablet by mouth 2 (two) times daily.   ezetimibe 10 MG tablet Commonly known as: ZETIA Take 10 mg by mouth daily.   furosemide 40 MG tablet Commonly known as: LASIX Take 40 mg by mouth daily.   levETIRAcetam 500 MG tablet Commonly known as: KEPPRA Take 1 tablet (500 mg total) by mouth 2 (two) times daily.   Livalo 4 MG Tabs Generic drug: Pitavastatin Calcium Take 4 mg by mouth at bedtime.   ONE-A-DAY MENS PO Take 1 tablet by mouth daily.   pantoprazole 40 MG tablet Commonly known as: PROTONIX Take 40 mg by mouth daily.   potassium chloride SA 20 MEQ tablet Commonly known as: KLOR-CON Take 20 mEq by mouth daily.   propranolol 20 MG tablet Commonly known as: INDERAL Take 20 mg by mouth 2 (two) times daily.        Disposition and follow-up:   Kevin Hart was discharged from [facility] in Stable condition.  At the hospital follow up visit please address:  Subdural Hematoma: 2/2 fall. CT remained stable Had held wafarin prior to fall for lumbar steroid injection. Hold warfarin and aspirin 10 more days then can restart. F/u with neurosurgery in 14 days for  repeat CT. Keppra for post-seizure ppx for one week.  Mechanical vs. Non-mechanical Fall: mechanical per patient but +dizzy during admission, which appears chronic 2/2 soft bp. Will do outpatient neurorehabilitation. ICD interrogation, orthostatics normal.  Hx of DVTs, on Coumadin: on warfarin for ~20 years. Has been off it for up to 10-14 days in the past for surgery without issue.  AKI: prerenal, resolved with fluids Hypokalemia: Decreased to 2.6, likely 2/2 IVF. Repeat K at f/u   2.  Labs / imaging needed at time of follow-up: Potassium, BMP, INR, CT head  3.  Pending labs/ test needing follow-up: none  Follow-up Appointments:  Follow-up Information     Dawley, Troy C, DO Follow up in 2 week(s).   Why: hold aspirin and coumadin for 10 days get repeat CT brain before appointment  Contact information: [address] [place] [address] [phone]         [facility] Follow up.   Specialty: Rehabilitation Contact information: [address] [place] [id number] [place] Hospital Course by problem list:  Kevin Hart is a 74 y.o. 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   Subdural hematoma: Per patient, received an epidural steroid injection for lumbar neuritis on 7/21. The next day on 7/22, after a walk with his neighbor, he reported that his low back/leg gave out causing him to fall backwards onto the sidewalk. Of note, his coumadin had been held for a few days prior to the epidural injection. Upon transfer to ED, was found to have a small subdural hematoma confirmed on CT in the posterior  parietoccipital region. He complained of a headache that was improving after analgesic administration. He also endorsed mild dizziness upon sitting up. Neurological exam was unremarkable and without focal or coordination deficits. Neurosurgery was consulted who concluded that he would not require surgical intervention and that management would proceed non operatively in coordination with the Internal Medicine Teaching Service. Home coumadin and aspirin were held due to bleeding risk. Post-TBI seizure prophylaxis was started with maintenance dose Keppra.   Patient was observed over night with frequent neuro exams. Repeat CT the following morning was unremarkable with no hematoma expansion or mass effect. He still complained of a mild headache and positional dizziness with sitting. Neuro exam unremarkable other than chronic left upper extremity weakness secondary to a prior shoulder injury. Neurosurgery deemed patient neurologically stable for discharge with plan to hold coumadin and aspirin x10 days and follow-up with Neurosurgery in 2 weeks. Seizure prophylaxis with Keppra was continued for 1 week.   Sudden fall- mechanical vs non-mechanical:  Lumbar neuritis, chronic back pain: ICM s/p single-chamber ICD 2016: Patient with history of lumbar neuritis and chronic back pain, requiring an epidural steroid injection for pain control day before admission. Was advised to rest, but following a walk, felt sudden low back/leg weakness which he attributes to causing his fall. He received tylenol for pain while inpatient and PT was consulted who had a 4 wheel walker w/ seat ordered to use after discharge   Regarding the time of his fall, he denied LOC (confirmed by his neighbor accompanying him), dizziness, chest pain, palpitations, vision changes, or N/V. On 7/23, he continued to complain of dizziness upon sitting up, and had a low BP in the 74J systolic which is baseline per chart review. Further workup including  orthostatics which were normal and an ICD investigation with normal findings, unchanged from previous. After workup was completed, patient was deemed medically stable for discharge.    Hx of DVTs, on Coumadin: Hx of positive Lupus anticoagulant:  Patient has been on Coumadin for hx of DVT and positive lupus anticoagulant. Last DVT 20 years ago. INR 1.2 on admission. As above, Neurosurgery recommend holding coumadin and aspirin x10 days. Repeat CT scan negative for hematoma expansion. Prior to discharge, discussed potential risks of DVT/PE with holding meds and potential signs/sx to watch out for.    Posterior supracranial hematoma and abrasion Hematoma visualized on CT head in the posterior parietoccipital region. Noted to have moderate swelling on initial physical exam which was improving on repeat exam the next day. He receiving wound care by nursing while inpatient. For pain control, he received Tylenol as needed.   Combined heart failure (EF 25-30% in 07/2019): Hypertension:  On lasix and entresto at home. Euvolemic on exam throughout HC. Home Entresto was continued on admission, but Lasix was held in the setting of an AKI noted on admission. As above, pressures were soft on 7/23 but within baseline per chart review.    Hypokalemia:  Initially within normal limits but noted to be decreased on 7/23 at 2.9 from 3.6, previously. QT interval was within normal limits on telemetry. Potassium was repleted.    Elevated creatinine c/f AKI:  Per chart review, baseline around 1.0. No history of renal dysfunction. On admission, creatinine 1.34. Denied urinary symptoms. He received a fluid bolus and some maintenance fluids, and follow-up Creatinine the next day was 1.1.   Discharge Vitals:   BP 117/70 (BP Location: Right Arm)    Pulse 76    Temp 97.6 F (36.4 C) (Oral)    Resp 20    Ht 5\' 11"  (1.803 m)    Wt (!) 107.9 kg    SpO2 98%    BMI 33.18 kg/m   Pertinent Labs, Studies, and Procedures:   CT  Head 7/23 1. Small subdural hematoma over the posterior left cerebral convexity without interval enlargement or mass effect. 2. No new intracranial abnormality. 3. Posterior scalp hematoma.     Electronically Signed   By: Logan Bores M.D.   On: 10/13/2019 07:28  CT Cervical Spine wo contrast IMPRESSION: 1. No acute fracture or traumatic listhesis of the cervical spine. 2. No appreciable interval progression of moderate multilevel cervical spondylosis most pronounced at C5-6 and C6-7. 3. Bilateral thyroid lobe nodules measuring up to 2.3 cm on the left and 1.7 cm on the right. Recommend thyroid US (ref: J Am Coll Radiol. 2015 Feb;12(2): 143-50). 4. Aortic atherosclerosis. (ICD10-I70.0).  Electronically Signed   By: Davina Poke D.O.   On: 10/12/2019 12:08  CT Head 7/22 IMPRESSION: 1. Small left occipital/posterior parietal subdural hematoma without mass effect. 2. Stable age related cerebral atrophy, ventriculomegaly and periventricular white matter disease. 3. Left occipital/posterior parietal scalp hematoma without underlying skull fracture.     Electronically Signed   By: Marijo Sanes M.D.   On: 10/12/2019 12:00  Discharge Instructions: Discharge Instructions     (HEART FAILURE PATIENTS) Call MD:  Anytime you have any of the following symptoms: 1) 3 pound weight gain in 24 hours or 5 pounds in 1 week 2) shortness of breath, with or without a dry hacking cough 3) swelling in the hands, feet or stomach 4) if you have to sleep on extra pillows at night in order to breathe.   Complete by: As directed    Ambulatory referral to Physical Therapy   Complete by: As directed    Ambulatory referral to Physical Therapy   Complete by: As directed    Call MD for:  difficulty breathing, headache or visual disturbances   Complete by: As directed    Call MD for:  extreme fatigue   Complete by: As directed    Call MD for:  persistant dizziness or light-headedness   Complete  by: As directed    Call MD for:  persistant nausea and vomiting   Complete by: As directed    Call MD for:  redness, tenderness, or signs of infection (pain, swelling, redness, odor or green/yellow discharge around incision site)   Complete by: As directed    Call MD for:  severe uncontrolled pain   Complete by: As directed    Call MD for:  temperature >100.4   Complete by: As directed    Diet - low sodium heart healthy   Complete by: As directed    Discharge instructions   Complete by: As directed    You were hospitalized for subdural hematoma. Thank you for allowing [place] to be part of your  care.   Please follow-up with your Primary Care Physician next week to reassess if you need to continue lasix and to check your kidney function.   Please follow-up with neurosurgery in two weeks, they will need to recheck imaging.   Please note these changes made to your medications:   Please take Keppra 500 mg - take 1 tablet two times per day for one week.   PLEASE STOP YOUR ASPIRIN AND WARFARIN FOR 10 DAYS. You can then restart these two medications.   Increase activity slowly   Complete by: As directed    No wound care   Complete by: As directed        Signed: Marty Heck, DO 10/14/2019, 10:22 AM   Pager: [phone]

## 2019-10-13 NOTE — TOC Transition Note (Addendum)
Transition of Care [facility]) - CM/SW Discharge Note   Patient Details  Name: Kevin Hart MRN: [id number] Date of Birth: December 22, 1945  Transition of Care [facility]) CM/SW Contact:  Bartholomew Crews, RN Phone Number: [phone] 10/13/2019, 4:10 PM   Clinical Narrative:     Update: Ambulatory referral for OP rehab updated to [place] [facility] site d/t patient already active at this location.  Spoke with patient at bedside. DME 4 wheel walker with seat has been delivered to bedside. Discussed referral to OP neuro rehab. Patient reports that he goes to OP rehab in [place] for his back. Advised that his current facility can likely continue to meet his needs. No further TOC needs identified.   Final next level of care: OP Rehab Barriers to Discharge: No Barriers Identified   Patient Goals and CMS Choice Patient states their goals for this hospitalization and ongoing recovery are:: return home CMS Medicare.gov Compare Post Acute Care list provided to:: Patient Choice offered to / list presented to : Patient  Discharge Placement                       Discharge Plan and Services In-house Referral: NA Discharge Planning Services: CM Consult            DME Arranged: Walker rolling with seat DME Agency: [employer] Date DME Agency Contacted: 10/13/19 Time DME Agency Contacted: 1500 Representative spoke with at DME Agency: [place]: NA [place]: NA        Social Determinants of Health ([place]) Interventions     Readmission Risk Interventions No flowsheet data found.

## 2019-10-13 NOTE — Plan of Care (Signed)
Min assist with adls 

## 2019-10-13 NOTE — Progress Notes (Signed)
Subjective: Kevin Hart a 74 y.o.malewho presents after a fall with head injury and found to have a posterior subdural hematoma.  Patient seen at bedsides. States he is feeling well. Still endorses a mild headache and states he wasn't given his aspirin. Patient informed aspirin is being held for bleeding precautions and informed that he can request for Tylenol. Patient also endorses continued dizziness upon sitting up and standing from bed. He denies vision changes, chest pain, shortness of breath.   He is also requesting physical therapy stating that he normally attends outpatient PT 3 times a week for his chronic back pain.   Review repeat CT findings showing no expansion of hematoma or mass effect. Discussed Neurosurgery's plan to continue hold warfarin and aspirin for bleeding precautions. Also discussed potential risks of holding the medications given his history of DVT and positive lupus anticoagulant. Patient states he has had one left lower extremity DVT about 20 years ago and has been on warfarin ever since. He notes the longest he has gone off of it has been 2 weeks during a prior hospital stay, without adverse complications.   Patient informed that if he is medically stable after some further testing, he can likely discharge home later today. Patient voiced understanding and willingness to go home.   Objective:  Vital signs in last 24 hours: Vitals:   10/13/19 0330 10/13/19 0500 10/13/19 0920 10/13/19 1155  BP: (!) 97/58  119/76 (!) 107/61  Pulse: 74  71 65  Resp: 18  20 19   Temp: 97.7 F (36.5 C)  97.6 F (36.4 C) 97.6 F (36.4 C)  TempSrc: Oral  Oral Oral  SpO2: 99%  95% 99%  Weight:  (!) 107.9 kg    Height:       Weight change:   Intake/Output Summary (Last 24 hours) at 10/13/2019 1329 Last data filed at 10/13/2019 1033 Gross per 24 hour  Intake 1481.41 ml  Output 0 ml  Net 1481.41 ml   Physical Exam: Constitution: NAD, sitting up in bed,  HEENT:  Wearing bandage on the posterior area of his head Cardio: Regular rate and rhythm, no gallops/rubs/murmurs, 2+ pulses throughout Respiratory: Normal respiratory effort, CTA bilaterally Abdominal: Soft, non-distended, non-tender to palpation, normal BS MSK: No lower extremity edema Skin: Skin is warm and dry Neuro:    Mental Status: Patient is alert and oriented x3. Mental status isat baseline.  Cranial Nerves: PERRL 23mm -> 1mm, face is symmetric, facial sensation is intact to light touch symmetrically, face is symmetric with full strength, hearing is grossly intact to conversation, shoulder shrug is full and symmetric. Motor: Motor strength is full throughout, appears 4/5 in left UE with history of shoulder injury, 5/5 in RUE.     Sensory: Sensation is intact to light touch throughout with no extinction. No visual field neglect.     Cerebellar/gait: Gait not assessed. No dysmetria on finger-to-nose.   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  Subdural hematoma:  Patient with injury to his head following a backwards mechanical fall onto a sidewalk. Small subdural hematoma confirmed on CT in the posterior parietoccipital region. Repeat CT unremarkable,  no hematoma expansion or mass effect. This morning, he continues to endorse a mild head and dizziness upon sitting up in bed. Neuro exam unremarkable other then chronic left upper extremity 4/5. Per Neurosurgery, he is stable for discharge with plan to hold coumadin and aspirin x10 days. He is scheduled to follow-up with Neurosurgery in 2 weeks. -Holding Coumadin and  aspirin, will continue to hold x10 days after discharge -Continue seizure ppx with Keppra 500 mg BID x1 week -Follow-up with neurosurgery in 2 weeks  Sudden fall- mechanical vs non-mechanical: Lumbar neuritis, chronic back pain: ICM s/p single-chamber ICD 2016: Patient with hx of lumbar neuritis and chronic back pain, requiring an epidural steroid injection for pain control day before admission. Was advised to rest, but following a walk, felt sudden low back/leg weakness which he attributes to causing his fall.  -As need Tylenol for pain -PT consulted, requested order for 4 wheel walker w/ seat to use at discharge  Today he continues to complain of dizziness upon sitting up, and has a low BP in the 90s (baseline per chart review). On telemetry, there are accelerated idioventricular rhythms and occasional spells of bradycardia. Cannot rule-out non-mechanical fall. At the time, he denies LOC (confirmed by his neighbor accompanying him), dizziness, chest pain, palpitations, vision changes, or N/V. Will further workup with orthostatics and investigation of his ICD to rule-out non-mechanical causes. If findings are unremarkable, he should be stable for discharge today. -Orthostatic vitals pending -Consult Cardiology for an investigation of his ICD  Hx of DVTs, on Coumadin: Hx of positive Lupus anticoagulant:  Patient has been on Coumadin for hx of DVT and positive lupus anticoagulant. Last DVT 20 years ago. INR 1.2 on admission. As above, Neurosurgery recommend holding coumadin and aspirin x10 days. Discussed potential risks of DVT/PE with holding meds and potential signs/sx to watch out for. -Hold Coumadin and Aspirin for 10 days, then restart -F/u with Neurosurgery and PCP  Posterior supracranial hematoma and abrasion: Hematoma visualized on CT head in the posterior parietoccipital region. Receiving wound care by nursing. Swelling is improving, though he continues to endorse headache.    -Continue wound care per nursing -Tylenol as needed for pain  Combined heart failure (EF 25-30% in 07/2019): Hypertension:  On lasix and entresto at home. Euvolemic on exam. As above, pressure have been soft today and telemetry notable for accelerated idioventricular rhythms and occasional spells of bradycardia. -ICD investigation, as above -Continue home Entresto 24-26 mg BID -Will continue holding Lasix given low pressures and recent AKI  CAD s/p PCI: Patient continues to denychest pain. EKG unremarkable for acute ischemia.  -Holding home Aspirin and Coumadin, as above -Continue home Zetia 10 mg daily  Hypokalemia:  Down 2.9 from 3.6 today. QT interval within normal limits on telemetry.  -Replete with 40 mEq KCl every 2 hours  Elevated creatinine c/f AKI: - resolved Per chart review, baseline around 1.0. No history of renal dysfunction. On admission, creatinine 1.34, now 1.1 s/p fluid bolus and temporary maintenance fluids. He is euvolemic on exam. Kidney insult appears to be resolved. -NPO discontinued after repeat CT head  Elevated WBC: - resolved On admission 20.1. He is afebrile and denies infectious symptoms. UA negative. Concern for infection is low, likely secondary to epidural steroid injection. Repeat CBC with improved WBC of 10.5.   Gout: Stable on Allopurinol. -Continue Allopurinol   Diet:Healthy Heart VTE: SCD  Fluids: none  Prior to Admission Living Arrangement: home Anticipated Discharge Location: home Barriers to Discharge: Unremarkable  telemetry and vitals Dispo: Anticipated discharge in approximately 0-1 day.     LOS: 0 days   Trina Ao, Medical Student 10/13/2019, 1:29 PM Pager: [phone] After 5pm on weekdays and 1pm on weekends: On Call pager [phone]

## 2019-10-13 NOTE — Evaluation (Signed)
Physical Therapy Evaluation Patient Details Name: Kevin Hart MRN: [id number] DOB: [birth year] Today's Date: 10/13/2019   History of Present Illness  Pt admitted 7/22 after falling backwards going up stairs and sustaining Left parietal occipital small subdural hematoma. PMH significant for but not limited to: MI, scoliosis, recent lumbar injection, h/o falls   Clinical Impression  Pt admitted with above diagnosis. Pt currently with functional limitations due to the deficits listed below (see PT Problem List). No loss of balance when ambulating, was dizzy for a short time initially upon sitting up.  Pt expecting and wanting to DC home today. We discussed risk of future falls with his daughter present. He understands the risks. Pt was educated on possible symptoms after concussion and how to manage them, including limiting activity, limiting screen time, etc. Unfortunately his wife is out of town until next weekend but he reports his neighbors can help him as needed.  He is already receiving outpt PT and reports his next appointment is on Tuesday. If pt does not DC as anticipated we will see him again for PT next week and set goals and POC at that time.    Follow Up Recommendations Outpatient PT;Supervision - Intermittent    Equipment Recommendations  Other (comment) (Rollator walker per pt request)    Recommendations for Other Services       Precautions / Restrictions Precautions Precautions: Fall Restrictions Weight Bearing Restrictions: No      Mobility  Bed Mobility Overal bed mobility: Independent (HOB slightly elevated )                Transfers Overall transfer level: Needs assistance Equipment used: None Transfers: Sit to/from Stand Sit to Stand: Supervision         General transfer comment: used upper extremities to assist  Ambulation/Gait Ambulation/Gait assistance: Supervision Gait Distance (Feet): 120 Feet (also walk 60 ft and 20 ft during  session) Assistive device: Rolling walker (2 wheeled) Gait Pattern/deviations: Wide base of support;Step-through pattern     General Gait Details: no balance losses, walked short distances during session without RW. Pt steadier with RW  Stairs            Wheelchair Mobility    Modified Rankin (Stroke Patients Only)       Balance Overall balance assessment: History of Falls                                           Pertinent Vitals/Pain Pain Assessment: 0-10 Pain Score: 2  Pain Location: headache Pain Descriptors / Indicators: Constant Pain Intervention(s): Monitored during session;RN gave pain meds during session    Home Living Family/patient expects to be discharged to:: Private residence Living Arrangements: Spouse/significant other (spouse is out of town until next weekend) Available Help at Discharge: Available PRN/intermittently (Reprts neighbors will check on him over the next week) Type of Home: House Home Access: Stairs to enter Entrance Stairs-Rails: None Entrance Stairs-Number of Steps: 2 Home Layout: Two level;Able to live on main level with bedroom/bathroom Home Equipment: Kasandra Knudsen - single point      Prior Function Level of Independence: Independent with assistive device(s) (used a cane for mobility typically)               Hand Dominance        Extremity/Trunk Assessment   Upper Extremity Assessment Upper Extremity Assessment: LUE deficits/detail (Repopts he  had a dislocated shoulder that surgical repair)    Lower Extremity Assessment Lower Extremity Assessment: Overall WFL for tasks assessed    Cervical / Trunk Assessment Cervical / Trunk Assessment: Normal  Communication   Communication: No difficulties  Cognition Arousal/Alertness: Awake/alert Behavior During Therapy: WFL for tasks assessed/performed Overall Cognitive Status: Within Functional Limits for tasks assessed                                         General Comments General comments (skin integrity, edema, etc.): Daughter present at end of session and with pts permission we discussed his case. She is a Museum/gallery exhibitions officer. Discussed with pts h/o falls he is at risk for future falls. HE was already receiving outpt PT prior to this fall for LE weakness and back pain. PT and daughter understand risk of falls and pt states "there are risks staying in the hospital too".       Exercises     Assessment/Plan    PT Assessment Patient needs continued PT services  PT Problem List Decreased balance;Decreased mobility;Decreased knowledge of use of DME       PT Treatment Interventions      PT Goals (Current goals can be found in the Care Plan section)  Acute Rehab PT Goals Patient Stated Goal: Pt wants to return home    Frequency     Barriers to discharge        Co-evaluation               AM-PAC PT "6 Clicks" Mobility  Outcome Measure Help needed turning from your back to your side while in a flat bed without using bedrails?: None Help needed moving from lying on your back to sitting on the side of a flat bed without using bedrails?: None Help needed moving to and from a bed to a chair (including a wheelchair)?: None Help needed standing up from a chair using your arms (e.g., wheelchair or bedside chair)?: A Little Help needed to walk in hospital room?: A Little Help needed climbing 3-5 steps with a railing? : A Little 6 Click Score: 21    End of Session Equipment Utilized During Treatment: Gait belt Activity Tolerance: Patient tolerated treatment well Patient left: in bed;with bed alarm set;with family/visitor present Nurse Communication: Mobility status (discussed with NT) PT Visit Diagnosis: History of falling (Z91.81)    Time: 9233-0076 PT Time Calculation (min) (ACUTE ONLY): 44 min   Charges:   PT Evaluation $PT Eval Moderate Complexity: 1 Mod PT Treatments $Gait Training: 23-37 mins        Lavonia Dana, PT   Acute Rehabilitation Services  Pager [phone] Office [phone] 10/13/2019   Melvern Banker 10/13/2019, 1:52 PM

## 2019-10-13 NOTE — Progress Notes (Signed)
PT Cancellation Note  Patient Details Name: Kevin Hart MRN: [id number] DOB: 1945/11/26   Cancelled Treatment:    Reason Eval/Treat Not Completed: Active bedrest order.  Awaiting increased activity orders.     Melvern Banker 10/13/2019, 11:11 AM  Lavonia Dana, PT   Acute Rehabilitation Services  Pager [phone] Office [phone] 10/13/2019

## 2019-10-13 NOTE — Discharge Instructions (Signed)
Head Injury, Adult There are many types of head injuries. They can be as minor as a bump. Some head injuries can be worse. Worse injuries include:  A strong hit to the head that shakes the brain back and forth causing damage (concussion).  A bruise (contusion) of the brain. This means there is bleeding in the brain that can cause swelling.  A cracked skull (skull fracture).  Bleeding in the brain that gathers, gets thick (makes a clot), and forms a bump (hematoma). Most problems from a head injury come in the first 24 hours. However, you may still have side effects up to 7-10 days after your injury. It is important to watch your condition for any changes. You may need to be watched in the emergency department or urgent care, or you may need to stay in the hospital. What are the causes? There are many possible causes of a head injury. A serious head injury may be caused by:  A car accident.  Bicycle or motorcycle accidents.  Sports injuries.  Falls. What are the signs or symptoms? Symptoms of a head injury include a bruise, bump, or bleeding where the injury happened. Other physical symptoms may include:  Headache.  Feeling sick to your stomach (nauseous) or vomiting.  Dizziness.  Feeling tired.  Being uncomfortable around bright lights or loud noises.  Shaking movements that you cannot control (seizures).  Trouble being woken up.  Passing out (fainting). Mental or emotional symptoms may include:  Feeling grumpy or cranky.  Confusion and memory problems.  Having trouble paying attention or concentrating.  Changes in eating or sleeping habits.  Feeling worried or nervous (anxious).  Feeling sad (depressed). How is this treated? Treatment for this condition depends on how severe the injury is and the type of injury you have. The main goal is to prevent complications and to allow the brain time to heal. Mild head injury If you have a mild head injury, you may be  sent home and treatment may include:  Being watched. A responsible adult should stay with you for 24 hours after your injury and check on you often.  Physical rest.  Brain rest.  Pain medicines. Severe head injury If you have a severe head injury, treatment may include:  Being watched closely. This includes hospitalization with frequent physical exams.  Medicines to: ? Help with pain. ? Prevent shaking movements that you cannot control. ? Help with brain swelling.  Using a machine that helps you breathe (ventilator).  Treatments to manage the swelling inside the brain.  Brain surgery. This may be needed to: ? Remove a blood clot. ? Stop the bleeding. ? Remove a part of the skull. This allows room for the brain to swell. Follow these instructions at home: Activity  Rest.  Avoid activities that are hard or tiring.  Make sure you get enough sleep.  Limit activities that need a lot of thought or attention, such as: ? Watching TV. ? Playing memory games and puzzles. ? Job-related work or homework. ? Working on the computer, social media, and texting.  Avoid activities that could cause another head injury until your doctor says it is okay. This includes playing sports. Having another head injury, especially before the first one has healed, can be dangerous.  Ask your doctor when it is safe for you to go back to your normal activities, such as work or school. Ask your doctor for a step-by-step plan for slowly going back to your normal activities.  Ask   your doctor when you can drive, ride a bicycle, or use heavy machinery. Do not do these activities if you are dizzy. Lifestyle   Do not drink alcohol until your doctor says it is okay.  Do not use drugs.  If it is harder than usual to remember things, write them down.  If you are easily distracted, try to do one thing at a time.  Talk with family members or close friends when making important decisions.  Tell your  friends, family, a trusted coworker, and work manager about your injury, symptoms, and limits (restrictions). Have them watch for any problems that are new or getting worse. General instructions  Take over-the-counter and prescription medicines only as told by your doctor.  Have someone stay with you for 24 hours after your head injury. This person should watch you for any changes in your symptoms and be ready to get help.  Keep all follow-up visits as told by your doctor. This is important. How is this prevented?  Work on your balance and strength. This can help you avoid falls.  Wear a seatbelt when you are in a moving vehicle.  Wear a helmet when you: ? Ride a bicycle. ? Ski. ? Do any other sport or activity that has a risk of injury.  If you drink alcohol: ? Limit how much you use to:  0-1 drink a day for women.  0-2 drinks a day for men. ? Be aware of how much alcohol is in your drink. In the [place] one drink equals one 12 oz bottle of beer (355 mL), one 5 oz glass of wine (148 mL), or one 1 oz glass of hard liquor (44 mL).  Make your home safer by: ? Getting rid of clutter from the floors and stairs. This includes things that can make you trip. ? Using grab bars in bathrooms and handrails by stairs. ? Placing non-slip mats on floors and in bathtubs. ? Putting more light in dim areas. Get help right away if:  You have: ? A very bad headache that is not helped by medicine. ? Trouble walking or weakness in your arms and legs. ? Clear or bloody fluid coming from your nose or ears. ? Changes in how you see (vision). ? Shaking movements that you cannot control.  You lose your balance.  You vomit.  The black centers of your eyes (pupils) change in size.  Your speech is slurred.  Your dizziness gets worse.  You pass out.  You are sleepier than normal and have trouble staying awake.  Your symptoms get worse. These symptoms may be an emergency. Do not wait to see  if the symptoms will go away. Get medical help right away. Call your local emergency services (911 in the [place] Do not drive yourself to the hospital. Summary  There are many types of head injuries. They can be as minor as a bump. Some head injuries can be worse  Treatment for this condition depends on how severe the injury is and the type of injury you have.  Ask your doctor when it is safe for you to go back to your normal activities, such as work or school.  To prevent a head injury, wear a seat belt in a car, wear a helmet when you use a a bicycle, limit your alcohol use, and make your home safer. This information is not intended to replace advice given to you by your health care provider. Make sure you discuss any questions you   have with your health care provider. Document Revised: 06/30/2018 Document Reviewed: 04/01/2018 Elsevier Patient Education  2020 [employer]  

## 2019-10-13 NOTE — Plan of Care (Signed)
Mod assist adls 

## 2019-10-17 ENCOUNTER — Ambulatory Visit: Payer: PPO | Admitting: Physical Therapy

## 2019-10-18 ENCOUNTER — Telehealth: Payer: Self-pay

## 2019-10-18 NOTE — Telephone Encounter (Signed)
   [facility] Pre-operative Risk Assessment    Request for surgical clearance:  1. What type of surgery is being performed? BILATERAL UPPER EYELID BLEPHAROPLASTY AND DIRECT BROW PTOSIS REPAIR   2. When is this surgery scheduled? TBD   3. What type of clearance is required (medical clearance vs. Pharmacy clearance to hold med vs. Both)? BOTH  4. Are there any medications that need to be held prior to surgery and how long?WARFARIN   5. Practice name and name of physician performing surgery? [facility] DR Elayne Snare [facility]    6. What is the office phone number? [phone]   7.   What is the office fax number? [phone]  8.   Anesthesia type (None, local, MAC, general) ? MAC

## 2019-10-18 NOTE — Telephone Encounter (Signed)
   Primary Cardiologist: Peter Martinique, MD  Chart reviewed as part of pre-operative protocol coverage. Given past medical history and time since last visit, based on ACC/AHA guidelines, Kevin Hart would be at acceptable risk from a cardiac stanpoint for the planned procedure without further cardiovascular testing.   Kevin Hart informed me that he had already been off his warfarin prior to an epidural injection on 10/11/2019.  He was being followed at [facility].  On 10/12/2019 he apparently fell and suffered a small subdural hematoma.  He was admitted to [facility].  He was seen in consult by neurosurgery, Dr. Reatha Armour.  He has been off anticoagulation since.  He is to contact Dr. Reatha Armour for follow-up in the next week.  I suggested the patient discussed the timing of his blepharoplasty with Dr. Reatha Armour.  Now may be an opportune time to get this done since he is already been off warfarin but will defer the timing Dr Dawley.  We normally would require Lovenox crossover based on his history of DVTs and his history of possible Lupus anticoagulant.   I will route this recommendation to the requesting party via Epic fax function and remove from pre-op pool.  Please call with questions.  [name], PA-C 10/18/2019, 4:26 PM

## 2019-10-18 NOTE — Telephone Encounter (Signed)
Patient with diagnosis of multiple DVTs (positive lupus anticoagulant) on warfarin for anticoagulation.    Procedure: bilateral upper eyelid blepharoplasty and direct brow ptosis repair Date of procedure: TBD  Per office protocol, patient can hold warfarin for 5 days prior to procedure, but he will require bridging with Lovenox (enoxaparin) around procedure. We do not follow patient's INRs. Please reach out to managing MD (likely PCP) to coordinate Lovenox bridge.

## 2019-10-18 NOTE — Telephone Encounter (Signed)
Pharmacy please review and make recommendations for holding Coumadin in this patient pre op blepharoplasty.  [name] PA-C 10/18/2019 2:55 PM

## 2019-10-19 ENCOUNTER — Encounter: Payer: PPO | Admitting: Physical Therapy

## 2019-10-20 ENCOUNTER — Telehealth: Payer: Self-pay

## 2019-10-20 NOTE — Telephone Encounter (Signed)
I let the pt know we did not receive his transmission Monday. He agreed to send one today.

## 2019-10-23 NOTE — Progress Notes (Signed)
No ICM remote transmission received for 10/16/2019 and next ICM transmission scheduled for 11/20/2019.

## 2019-10-24 ENCOUNTER — Ambulatory Visit: Payer: PPO | Admitting: Physical Therapy

## 2019-10-24 ENCOUNTER — Other Ambulatory Visit (HOSPITAL_COMMUNITY): Payer: Self-pay | Admitting: Neurological Surgery

## 2019-10-24 DIAGNOSIS — R55 Syncope and collapse: Secondary | ICD-10-CM | POA: Diagnosis not present

## 2019-10-24 DIAGNOSIS — M21619 Bunion of unspecified foot: Secondary | ICD-10-CM | POA: Diagnosis not present

## 2019-10-24 DIAGNOSIS — I10 Essential (primary) hypertension: Secondary | ICD-10-CM | POA: Diagnosis not present

## 2019-10-24 DIAGNOSIS — S065X9A Traumatic subdural hemorrhage with loss of consciousness of unspecified duration, initial encounter: Secondary | ICD-10-CM

## 2019-10-24 DIAGNOSIS — S065XAA Traumatic subdural hemorrhage with loss of consciousness status unknown, initial encounter: Secondary | ICD-10-CM

## 2019-10-25 ENCOUNTER — Other Ambulatory Visit: Payer: Self-pay

## 2019-10-25 ENCOUNTER — Encounter (HOSPITAL_BASED_OUTPATIENT_CLINIC_OR_DEPARTMENT_OTHER): Payer: Self-pay | Admitting: *Deleted

## 2019-10-25 ENCOUNTER — Emergency Department (HOSPITAL_BASED_OUTPATIENT_CLINIC_OR_DEPARTMENT_OTHER)
Admission: EM | Admit: 2019-10-25 | Discharge: 2019-10-25 | Disposition: A | Discharge status: Home / Self Care | Payer: PPO | Attending: Emergency Medicine | Admitting: Emergency Medicine

## 2019-10-25 ENCOUNTER — Emergency Department (HOSPITAL_BASED_OUTPATIENT_CLINIC_OR_DEPARTMENT_OTHER): Payer: PPO

## 2019-10-25 DIAGNOSIS — G9389 Other specified disorders of brain: Secondary | ICD-10-CM | POA: Diagnosis not present

## 2019-10-25 DIAGNOSIS — Z79899 Other long term (current) drug therapy: Secondary | ICD-10-CM | POA: Diagnosis not present

## 2019-10-25 DIAGNOSIS — I110 Hypertensive heart disease with heart failure: Secondary | ICD-10-CM | POA: Diagnosis not present

## 2019-10-25 DIAGNOSIS — Z7982 Long term (current) use of aspirin: Secondary | ICD-10-CM | POA: Diagnosis not present

## 2019-10-25 DIAGNOSIS — I25110 Atherosclerotic heart disease of native coronary artery with unstable angina pectoris: Secondary | ICD-10-CM | POA: Diagnosis not present

## 2019-10-25 DIAGNOSIS — W1809XA Striking against other object with subsequent fall, initial encounter: Secondary | ICD-10-CM | POA: Insufficient documentation

## 2019-10-25 DIAGNOSIS — Y999 Unspecified external cause status: Secondary | ICD-10-CM | POA: Insufficient documentation

## 2019-10-25 DIAGNOSIS — S0990XA Unspecified injury of head, initial encounter: Secondary | ICD-10-CM | POA: Diagnosis not present

## 2019-10-25 DIAGNOSIS — Y9289 Other specified places as the place of occurrence of the external cause: Secondary | ICD-10-CM | POA: Diagnosis not present

## 2019-10-25 DIAGNOSIS — G319 Degenerative disease of nervous system, unspecified: Secondary | ICD-10-CM | POA: Diagnosis not present

## 2019-10-25 DIAGNOSIS — I5022 Chronic systolic (congestive) heart failure: Secondary | ICD-10-CM | POA: Diagnosis not present

## 2019-10-25 DIAGNOSIS — S0001XA Abrasion of scalp, initial encounter: Secondary | ICD-10-CM | POA: Diagnosis not present

## 2019-10-25 DIAGNOSIS — Y9389 Activity, other specified: Secondary | ICD-10-CM | POA: Insufficient documentation

## 2019-10-25 DIAGNOSIS — W19XXXA Unspecified fall, initial encounter: Secondary | ICD-10-CM

## 2019-10-25 DIAGNOSIS — R220 Localized swelling, mass and lump, head: Secondary | ICD-10-CM | POA: Diagnosis not present

## 2019-10-25 NOTE — ED Triage Notes (Signed)
C/o head injury x 1 hr ago , recent admit for fall with Subdural  hematoma

## 2019-10-25 NOTE — Discharge Instructions (Addendum)
Follow-up with neurosurgery as previously recommended. Unless something changes, you do not need an additional CT scan. The one you had today should suffice.

## 2019-10-26 ENCOUNTER — Encounter: Payer: PPO | Admitting: Physical Therapy

## 2019-10-30 ENCOUNTER — Telehealth: Payer: Self-pay

## 2019-10-30 DIAGNOSIS — M21372 Foot drop, left foot: Secondary | ICD-10-CM | POA: Diagnosis not present

## 2019-10-30 DIAGNOSIS — M2142 Flat foot [pes planus] (acquired), left foot: Secondary | ICD-10-CM | POA: Diagnosis not present

## 2019-10-30 DIAGNOSIS — M2012 Hallux valgus (acquired), left foot: Secondary | ICD-10-CM | POA: Diagnosis not present

## 2019-10-30 DIAGNOSIS — M2041 Other hammer toe(s) (acquired), right foot: Secondary | ICD-10-CM | POA: Diagnosis not present

## 2019-10-30 NOTE — ED Provider Notes (Signed)
[facility] EMERGENCY DEPARTMENT Provider Note   CSN: [id number] Arrival date & time: 10/25/19  1641     History Chief Complaint  Patient presents with  . Head Injury    Kevin Hart is a 74 y.o. male.  HPI   74 year old male presenting after fall and head injury.  Concerned because he had a recent subdural hematoma.  Lost his balance earlier today and struck his head.  No LOC.  Very mild pain.  No other acute complaints.  No change in vision.  No acute numbness, tingling or focal loss of strength.  No confusion per his wife at bedside.  Past Medical History:  Diagnosis Date  . Arthritis   . Bladder stone 10/26/2017  . BPH (benign prostatic hyperplasia)    takes Flomax daily  . CAD (coronary artery disease)    a. prior LAD stenting;  b. 06/2014 NSTEMI/PCI: LM nl, LAD 30p, 28m ISR (3.0x20 Promus DES), LCX nl, RCA nondom, nl, EF 25-30% ant, apical, dist inf AK.  . Cataracts, bilateral    immature  . Chronic systolic CHF (congestive heart failure) ([place]) 12/31/2014  . DVT (deep venous thrombosis) ([facility])    RECURRENT left leg  . Dyslipidemia   . GERD (gastroesophageal reflux disease)   . Gout   . History of kidney stones   . Hypertension   . Ischemic cardiomyopathy 07/06/2014  . Lupus anticoagulant positive   . Myocardial infarct ([place]) 2000  . Myocardial infarct ([place]) 2016  . S/P drug eluting coronary stent placement, 07/06/14, Promus 07/07/2014  . Sleep apnea    biPaP  . Subarachnoid bleed ([place]) 2015   after falling and hitting his head    Patient Active Problem List   Diagnosis Date Noted  . SDH (subdural hematoma) ([place]) 10/12/2019  . Hypokalemia   . OSA on CPAP   . Chest pain 08/11/2019  . Precordial chest pain   . Unstable angina ([place]) 07/26/2018  . BPH with urinary obstruction 02/24/2018  . Renal hemorrhage, right 10/26/2017  . S/P left rotator cuff repair 03/05/2016  . Anterior dislocation of left shoulder 11/29/2015  . History of implantable  cardioverter-defibrillator (ICD) placement 03/16/2015  . ICD (implantable cardioverter-defibrillator) in place   . Chronic systolic heart failure ([place]) 12/31/2014  . Chronic systolic dysfunction of left ventricle 11/27/2014  . S/P drug eluting coronary stent placement, mLAD 07/06/14, Promus 07/07/2014  . At risk for sudden cardiac death, EF 25-30% with PVCs, with lifevest 07/07/2014  . Coronary artery disease involving native coronary artery of native heart with unstable angina pectoris ([place])   . Cardiomyopathy, ischemic   . History of DVT (deep vein thrombosis), on coumadin   . NSTEMI (non-ST elevated myocardial infarction) ([place]) 07/05/2014  . CAD (coronary artery disease)   . Recurrent acute deep vein thrombosis (DVT) of lower extremity ([place])   . Dyslipidemia   . GERD (gastroesophageal reflux disease)   . Anterior myocardial infarction ([place])   . Lupus anticoagulant positive     Past Surgical History:  Procedure Laterality Date  . COLONOSCOPY    . EP IMPLANTABLE DEVICE [name] 11/27/2014   [employer] Fortify Assura VR  implanted by Dr Rayann Heman for primary prevention  . LEFT HEART CATH AND CORONARY ANGIOGRAPHY [name] 07/29/2018   Procedure: LEFT HEART CATH AND CORONARY ANGIOGRAPHY;  Surgeon: Sherren Mocha, MD;  Location: [facility];  Service: Cardiovascular;  Laterality: [name];  . LEFT HEART CATH AND CORONARY ANGIOGRAPHY [name] 08/14/2019   Procedure:  LEFT HEART CATH AND CORONARY ANGIOGRAPHY;  Surgeon: Troy Sine, MD;  Location: [facility];  Service: Cardiovascular;  Laterality: [name];  . LEFT HEART CATHETERIZATION WITH CORONARY ANGIOGRAM [name] 07/06/2014   Procedure: LEFT HEART CATHETERIZATION WITH CORONARY ANGIOGRAM;  Surgeon: Peter M Martinique, MD;  Location: [facility] CATH LAB;  Service: Cardiovascular;  Laterality: [name];  . PERCUTANEOUS CORONARY STENT INTERVENTION (PCI-S) Right 07/06/2014   Procedure: PERCUTANEOUS CORONARY STENT INTERVENTION (PCI-S);  Surgeon: Peter M Martinique, MD;   Location: [facility] CATH LAB;  Service: Cardiovascular;  Laterality: Right;  . SHOULDER ARTHROSCOPY WITH SUBACROMIAL DECOMPRESSION Left 03/05/2016   Procedure: SHOULDER ARTHROSCOPY ROTATOR CUFF REPAIR WITH SUBACROMIAL DECOMPRESSION;  Surgeon: Tania Ade, MD;  Location: [facility];  Service: Orthopedics;  Laterality: Left;  SHOULDER ARTHROSCOPY ROTATOR CUFF REPAIR WITH SUBACROMIAL DECOMPRESSION  . SHOULDER CLOSED REDUCTION Left 11/29/2015   Procedure: CLOSED REDUCTION SHOULDER;  Surgeon: Tania Ade, MD;  Location: [facility];  Service: Orthopedics;  Laterality: Left;  . TRANSURETHRAL RESECTION OF PROSTATE [name] 02/24/2018   Procedure: TRANSURETHRAL RESECTION OF THE PROSTATE (TURP);  Surgeon: Irine Seal, MD;  Location: [facility];  Service: Urology;  Laterality: [name];  . WRIST SURGERY Bilateral    plates and screws        Family History  Problem Relation Age of Onset  . Aneurysm Father     Social History   Tobacco Use  . Smoking status: Never Smoker  . Smokeless tobacco: Never Used  Vaping Use  . Vaping Use: Never used  Substance Use Topics  . Alcohol use: Yes    Alcohol/week: 0.0 standard drinks    Comment: seldom   . Drug use: No    Home Medications Prior to Admission medications   Medication Sig Start Date End Date Taking? Authorizing Provider  allopurinol (ZYLOPRIM) 300 MG tablet Take 300 mg by mouth daily.  02/27/16   [provider]  allopurinol (ZYLOPRIM) 300 MG tablet Take 150 mg by mouth daily.  09/19/19   [provider]  aspirin 81 MG chewable tablet Chew 1 tablet (81 mg total) by mouth daily. 08/15/19   Dorothy Spark, MD  ENTRESTO 24-26 MG Take 1 tablet by mouth 2 (two) times daily. 10/02/19   [provider]  ezetimibe (ZETIA) 10 MG tablet Take 1 tablet (10 mg total) by mouth daily. 08/15/19   Dorothy Spark, MD  ezetimibe (ZETIA) 10 MG tablet Take 10 mg by mouth daily.    [provider]  furosemide (LASIX) 40 MG tablet Take 40 mg by mouth  daily.     [provider]  furosemide (LASIX) 40 MG tablet Take 40 mg by mouth daily. 10/02/19   [provider]  levETIRAcetam (KEPPRA) 500 MG tablet Take 1 tablet (500 mg total) by mouth 2 (two) times daily. 10/13/19   Seawell, Jaimie A, DO  LIVALO 4 MG TABS Take 1 tablet by mouth at bedtime. 08/18/19   [provider]  LIVALO 4 MG TABS Take 4 mg by mouth at bedtime.  09/19/19   [provider]  Multiple Vitamin (MULTI-VITAMINS) TABS Take 1 tablet by mouth daily.     [provider]  Multiple Vitamin (ONE-A-DAY MENS PO) Take 1 tablet by mouth daily.    [provider]  nitroGLYCERIN (NITROSTAT) 0.4 MG SL tablet Place 1 tablet (0.4 mg total) under the tongue every 5 (five) minutes x 3 doses as needed for chest pain. 12/08/18   Martinique, Peter M, MD  pantoprazole ([place])  40 MG tablet Take 1 tablet (40 mg total) by mouth daily. 07/07/14   Lendon Colonel, NP  pantoprazole (PROTONIX) 40 MG tablet Take 40 mg by mouth daily. 09/19/19   [provider]  potassium chloride SA (KLOR-CON) 20 MEQ tablet Take 20 mEq by mouth daily.    [provider]  potassium chloride SA (KLOR-CON) 20 MEQ tablet Take 20 mEq by mouth daily. 08/30/19   [provider]  propranolol (INDERAL) 20 MG tablet Take 20 mg by mouth 2 (two) times daily.    [provider]  propranolol (INDERAL) 20 MG tablet Take 20 mg by mouth 2 (two) times daily. 09/19/19   [provider]  sacubitril-valsartan (ENTRESTO) 24-26 MG Take 1 tablet by mouth 2 (two) times daily. 08/15/19   Dorothy Spark, MD  warfarin (COUMADIN) 5 MG tablet Take 0.5-1 tablets (2.5-5 mg total) by mouth See admin instructions. Take 5mg  on 5/25 and 5/26 then resume home regimen of 2.5mg  daily except 5mg  on Wednesday 08/15/19   Dorothy Spark, MD    Allergies    Patient has no known allergies.  Review of Systems   Review of Systems All systems reviewed and negative, other  than as noted in HPI.  Physical Exam Updated Vital Signs BP 128/79 (BP Location: Right Arm)   Pulse 75   Temp 98.1 F (36.7 C) (Oral)   Resp 14   Ht 5\' 11"  (1.803 m)   Wt 97.5 kg   SpO2 100%   BMI 29.99 kg/m   Physical Exam Vitals and nursing note reviewed.  Constitutional:      General: He is not in acute distress.    Appearance: He is well-developed.  HENT:     Head: Normocephalic.     Comments: Abrasion to the top of the head.  No midline spinal tenderness. Eyes:     General:        Right eye: No discharge.        Left eye: No discharge.     Conjunctiva/sclera: Conjunctivae normal.  Cardiovascular:     Rate and Rhythm: Normal rate and regular rhythm.     Heart sounds: Normal heart sounds. No murmur heard.  No friction rub. No gallop.   Pulmonary:     Effort: Pulmonary effort is normal. No respiratory distress.     Breath sounds: Normal breath sounds.  Abdominal:     General: There is no distension.     Palpations: Abdomen is soft.     Tenderness: There is no abdominal tenderness.  Musculoskeletal:        General: No tenderness.     Cervical back: Neck supple.  Skin:    General: Skin is warm and dry.  Neurological:     Mental Status: He is alert.  Psychiatric:        Behavior: Behavior normal.        Thought Content: Thought content normal.     ED Results / Procedures / Treatments   Labs (all labs ordered are listed, but only abnormal results are displayed) Labs Reviewed - No data to display  EKG None  Radiology No results found.   CT Head Wo Contrast  Result Date: 10/25/2019 CLINICAL DATA:  Status post trauma. EXAM: CT HEAD WITHOUT CONTRAST TECHNIQUE: Contiguous axial images were obtained from the base of the skull through the vertex without intravenous contrast. COMPARISON:  October 13, 2019 FINDINGS: Brain: There is mild cerebral atrophy with widening of the extra-axial spaces  and ventricular dilatation. There are areas of decreased attenuation within  the white matter tracts of the supratentorial brain, consistent with microvascular disease changes. Vascular: No hyperdense vessel or unexpected calcification. Skull: Normal. Negative for fracture or focal lesion. Sinuses/Orbits: No acute finding. Other: Mild left parietooccipital scalp soft tissue swelling is seen. Very mild right frontal scalp soft tissue swelling is also noted. IMPRESSION: 1. Mild left parietooccipital and right frontal scalp soft tissue swelling. 2. No acute intracranial abnormality. Electronically Signed   By: Virgina Norfolk M.D.   On: 10/25/2019 18:23   CT HEAD WO CONTRAST  Result Date: 10/13/2019 CLINICAL DATA:  Head trauma.  Follow-up subdural hematoma. EXAM: CT HEAD WITHOUT CONTRAST TECHNIQUE: Contiguous axial images were obtained from the base of the skull through the vertex without intravenous contrast. COMPARISON:  10/12/2019 FINDINGS: Brain: A small subdural hematoma over the posterior left cerebral convexity demonstrates some interval redistribution with decreased maximal thickness (now 3 mm) but without suspected significant change in overall volume. There is no associated mass effect. No new intracranial hemorrhage, acute infarct, mass, or midline shift is identified. There is mild cerebral atrophy and likely minimal chronic small vessel ischemia in the cerebral white matter. Vascular: Calcified atherosclerosis at the skull base. Skull: No acute fracture or suspicious osseous lesion. Sinuses/Orbits: Trace right mastoid effusion. Clear paranasal sinuses. Bilateral cataract extraction. Other: Persistent moderate-sized posterior scalp hematoma. IMPRESSION: 1. Small subdural hematoma over the posterior left cerebral convexity without interval enlargement or mass effect. 2. No new intracranial abnormality. 3. Posterior scalp hematoma. Electronically Signed   By: Logan Bores M.D.   On: 10/13/2019 07:28   CT HEAD WO CONTRAST  Result Date: 10/12/2019 CLINICAL DATA:  [place]. Hit  head. EXAM: CT HEAD WITHOUT CONTRAST TECHNIQUE: Contiguous axial images were obtained from the base of the skull through the vertex without intravenous contrast. COMPARISON:  05/22/2019 FINDINGS: Brain: Stable age related cerebral atrophy, ventriculomegaly and periventricular white matter disease. No CT findings for acute hemispheric infarction or intracranial hemorrhage. No mass lesions. The brainstem and cerebellum are normal. There is a small subdural hematoma noted in the left occipital/posterior parietal region. This measures a maximum of 4.5 mm in diameter and there is no mass effect. Vascular: Stable vascular calcifications. No aneurysm or hyperdense vessels. Skull: No skull fracture or bone lesions. Sinuses/Orbits: The paranasal sinuses and mastoid air cells are clear. The globes are intact. Other: There is a left occipital/posterior parietal scalp hematoma but no underlying skull fracture. IMPRESSION: 1. Small left occipital/posterior parietal subdural hematoma without mass effect. 2. Stable age related cerebral atrophy, ventriculomegaly and periventricular white matter disease. 3. Left occipital/posterior parietal scalp hematoma without underlying skull fracture. Electronically Signed   By: Marijo Sanes M.D.   On: 10/12/2019 12:00   CT CERVICAL SPINE WO CONTRAST  Result Date: 10/12/2019 CLINICAL DATA:  Fall, neck trauma EXAM: CT CERVICAL SPINE WITHOUT CONTRAST TECHNIQUE: Multidetector CT imaging of the cervical spine was performed without intravenous contrast. Multiplanar CT image reconstructions were also generated. COMPARISON:  05/22/2019 FINDINGS: Alignment: Facet joints are aligned without dislocation. Trace degenerative anterolisthesis of C4 on C5 is unchanged. Straightening of the cervical lordosis. Dens and lateral masses remain aligned. Skull base and vertebrae: No acute fracture. No primary bone lesion or focal pathologic process. Soft tissues and spinal canal: No prevertebral fluid or  swelling. No visible canal hematoma. Disc levels: No appreciable interval progression of moderate cervical spondylosis most pronounced at C5-6 and C6-7. Bulky left-sided facet arthropathy at C2-3 and C3-4.  Bilateral facet arthropathy with prominent uncovertebral spurring at C4-5. Prominent anterior endplate osteophytosis emanating from the anterior aspect of the lower cervical spine and visualized upper thoracic levels. Upper chest: Included lung apices are clear. Aortic atherosclerosis is noted. Other: Bilateral thyroid lobe nodules measuring up to 2.3 cm on the left and 1.7 cm on the right. IMPRESSION: 1. No acute fracture or traumatic listhesis of the cervical spine. 2. No appreciable interval progression of moderate multilevel cervical spondylosis most pronounced at C5-6 and C6-7. 3. Bilateral thyroid lobe nodules measuring up to 2.3 cm on the left and 1.7 cm on the right. Recommend thyroid US (ref: J Am Coll Radiol. 2015 Feb;12(2): 143-50). 4. Aortic atherosclerosis. (ICD10-I70.0). Electronically Signed   By: Davina Poke D.O.   On: 10/12/2019 12:08     Procedures Procedures (including critical care time)  Medications Ordered in ED Medications - No data to display  ED Course  I have reviewed the triage vital signs and the nursing notes.  Pertinent labs & imaging results that were available during my care of the patient were reviewed by me and considered in my medical decision making (see chart for details).    MDM Rules/Calculators/A&P                          74 year old male presenting after fall.  No acute radiographic injury noted.  Resolution of prior bleeding noted.  Neuro exam is nonfocal.  Reassurance was provided.  Return precautions discussed.  Outpatient follow-up otherwise. Final Clinical Impression(s) / ED Diagnoses Final diagnoses:  Fall, initial encounter  Abrasion of scalp, initial encounter    Rx / DC Orders ED Discharge Orders    None       Virgel Manifold,  MD 10/30/19 [phone]

## 2019-10-30 NOTE — Telephone Encounter (Signed)
Spoke to patient Dr.Jordan advised Neuro has cleared you to resume coumadin.Stated PCP follows coumadin.Advised to call and let them know you are starting coumadin back this afternoon and ask when you will need INR.Advised to keep appointment already scheduled with Dr.Jordan 9/8 at 8:40 am.

## 2019-10-31 ENCOUNTER — Encounter: Payer: PPO | Admitting: Physical Therapy

## 2019-10-31 ENCOUNTER — Ambulatory Visit (HOSPITAL_BASED_OUTPATIENT_CLINIC_OR_DEPARTMENT_OTHER): Payer: PPO

## 2019-10-31 ENCOUNTER — Encounter (HOSPITAL_BASED_OUTPATIENT_CLINIC_OR_DEPARTMENT_OTHER): Payer: Self-pay

## 2019-11-01 DIAGNOSIS — S065X9A Traumatic subdural hemorrhage with loss of consciousness of unspecified duration, initial encounter: Secondary | ICD-10-CM | POA: Diagnosis not present

## 2019-11-02 ENCOUNTER — Encounter: Payer: Self-pay | Admitting: Physical Therapy

## 2019-11-02 ENCOUNTER — Ambulatory Visit: Payer: PPO | Attending: Physician Assistant | Admitting: Physical Therapy

## 2019-11-02 ENCOUNTER — Other Ambulatory Visit: Payer: Self-pay

## 2019-11-02 DIAGNOSIS — M25512 Pain in left shoulder: Secondary | ICD-10-CM | POA: Insufficient documentation

## 2019-11-02 DIAGNOSIS — Z85828 Personal history of other malignant neoplasm of skin: Secondary | ICD-10-CM | POA: Diagnosis not present

## 2019-11-02 DIAGNOSIS — R2681 Unsteadiness on feet: Secondary | ICD-10-CM | POA: Diagnosis not present

## 2019-11-02 DIAGNOSIS — L218 Other seborrheic dermatitis: Secondary | ICD-10-CM | POA: Diagnosis not present

## 2019-11-02 DIAGNOSIS — L821 Other seborrheic keratosis: Secondary | ICD-10-CM | POA: Diagnosis not present

## 2019-11-02 DIAGNOSIS — R262 Difficulty in walking, not elsewhere classified: Secondary | ICD-10-CM | POA: Insufficient documentation

## 2019-11-02 DIAGNOSIS — D239 Other benign neoplasm of skin, unspecified: Secondary | ICD-10-CM | POA: Diagnosis not present

## 2019-11-02 DIAGNOSIS — L578 Other skin changes due to chronic exposure to nonionizing radiation: Secondary | ICD-10-CM | POA: Diagnosis not present

## 2019-11-02 DIAGNOSIS — M6281 Muscle weakness (generalized): Secondary | ICD-10-CM | POA: Insufficient documentation

## 2019-11-02 DIAGNOSIS — R293 Abnormal posture: Secondary | ICD-10-CM | POA: Diagnosis not present

## 2019-11-02 DIAGNOSIS — R42 Dizziness and giddiness: Secondary | ICD-10-CM | POA: Diagnosis not present

## 2019-11-02 DIAGNOSIS — L570 Actinic keratosis: Secondary | ICD-10-CM | POA: Diagnosis not present

## 2019-11-02 DIAGNOSIS — L910 Hypertrophic scar: Secondary | ICD-10-CM | POA: Diagnosis not present

## 2019-11-02 NOTE — Therapy (Signed)
[facility] [address]  [place], [place], [address] Phone: [phone]   Fax:  [phone]  Physical Therapy Re-Evaluation  Patient Details  Name: Kevin Hart MRN: [id number] Date of Birth: January 12, 1946 Referring Provider (PT): Leanor Kail, [place]  Progress Note Reporting Period 11/02/19 to 11/02/19  See note below for Objective Data and Assessment of Progress/Goals.     Encounter Date: 11/02/2019   PT End of Session - 11/02/19 1724    Visit Number 10    Number of Visits 22    Date for PT Re-Evaluation 12/14/19    Authorization Type [employer]    PT Start Time 1452    PT Stop Time 1536    PT Time Calculation (min) 44 min    Activity Tolerance Patient tolerated treatment well;Patient limited by pain    Behavior During Therapy [facility] for tasks assessed/performed           Past Medical History:  Diagnosis Date  . Arthritis   . Bladder stone 10/26/2017  . BPH (benign prostatic hyperplasia)    takes Flomax daily  . CAD (coronary artery disease)    a. prior LAD stenting;  b. 06/2014 NSTEMI/PCI: LM nl, LAD 30p, 64mISR (3.0x20 Promus DES), LCX nl, RCA nondom, nl, EF 25-30% ant, apical, dist inf [place].  . Cataracts, bilateral    immature  . Chronic systolic CHF (congestive heart failure) ([facility] 12/31/2014  . DVT (deep venous thrombosis) (HCC)    RECURRENT left leg  . Dyslipidemia   . GERD (gastroesophageal reflux disease)   . Gout   . History of kidney stones   . Hypertension   . Ischemic cardiomyopathy 07/06/2014  . Lupus anticoagulant positive   . Myocardial infarct ([facility] 2000  . Myocardial infarct ([facility] [place] 2016  . S/P drug eluting coronary stent placement, 07/06/14, Promus 07/07/2014  . Sleep apnea    biPaP  . [place]achnoid bleed [place] [place]   after falling and hitting his head    Past Surgical History:  Procedure Laterality Date  . COLONOSCOPY    . EP IMPLANTABLE DEVICE [name] 11/27/2014   [employer] VR  implanted by Dr ARayann Hemanfor primary prevention  . LEFT HEART CATH AND CORONARY ANGIOGRAPHY [name] 07/29/2018   Procedure: LEFT HEART CATH AND CORONARY ANGIOGRAPHY;  Surgeon: CSherren Mocha MD;  [facility];  Service: Cardiovascular;  Laterality: [name];  . LEFT HEART CATH AND CORONARY ANGIOGRAPHY [name] 08/14/2019   Procedure: LEFT HEART CATH AND CORONARY ANGIOGRAPHY;  Surgeon: KTroy Sine MD;  [facility];  Service: Cardiovascular;  Laterality: [name];  . LEFT HEART CATHETERIZATION WITH CORONARY ANGIOGRAM [name] 07/06/2014   Procedure: LEFT HEART CATHETERIZATION WITH CORONARY ANGIOGRAM;  Surgeon: Peter M JMartinique MD;  [facility];  Service: Cardiovascular;  Laterality: [name];  . PERCUTANEOUS CORONARY STENT INTERVENTION (PCI-S) Right 07/06/2014   Procedure: PERCUTANEOUS CORONARY STENT INTERVENTION (PCI-S);  Surgeon: Peter M JMartinique MD;  [facility];  Service: Cardiovascular;  Laterality: Right;  . SHOULDER ARTHROSCOPY WITH SUBACROMIAL DECOMPRESSION Left 03/05/2016   Procedure: SHOULDER ARTHROSCOPY ROTATOR CUFF REPAIR WITH SUBACROMIAL DECOMPRESSION;  Surgeon: JTania Ade MD;  [facility]  Service: Orthopedics;  Laterality: Left;  SHOULDER ARTHROSCOPY ROTATOR CUFF REPAIR WITH SUBACROMIAL DECOMPRESSION  . SHOULDER CLOSED REDUCTION Left 11/29/2015   Procedure: CLOSED REDUCTION SHOULDER;  Surgeon: JTania Ade MD;  [facility] [facility]  Service: Orthopedics;  Laterality: Left;  .  TRANSURETHRAL RESECTION OF PROSTATE [name] 02/24/2018   Procedure: TRANSURETHRAL RESECTION OF THE PROSTATE (TURP);  Surgeon: Irine Seal, MD;  Location: [facility];  Service: Urology;  Laterality: [name];  . WRIST SURGERY Bilateral    plates and screws     There were no vitals filed for this visit.   Subjective Assessment - 11/02/19 1454    Subjective Reports that he fell 3 weeks ago and sustained a subdural hematoma. Had gotten 2 shots in his back and went  for a walk, stepped up on the porch step and fell backwards. 2nd fall occured ~10 days later when he tripped over a chair. Started walking with a 4WW after the first fall. Feeling weaker and gets dizzy with STS and supine>sit. Denies recent HAs since his falls. Was cleared by MD for therapy as the "bleeding in my brain is gone."    Pertinent History DVT LLE 20 years ago, unstable angina (hospitalized 2 weeks ago), 2 MI's, scoliosis    Patient Stated Goals Pt would like to get better at getting up off of the floor and be steadier when he is walking.    Currently in Pain? No/denies              [facility] PT Assessment - 11/02/19 0001      Assessment   Medical Diagnosis Progressive Weakness    Referring Provider (PT) Leanor Kail, PA    Onset Date/Surgical Date 06/28/19      Strength   Strength Assessment Site Hip;Knee;Ankle    Right Shoulder Flexion 4/5    Right Shoulder ABduction 4/5    Right Shoulder Internal Rotation 4-/5    Right Shoulder External Rotation 4-/5    Left Shoulder Flexion 2-/5   limited by pain   Left Shoulder ABduction 2-/5   limited by pain   Left Shoulder Internal Rotation 2-/5   limited by pain   Left Shoulder External Rotation 2-/5   limited by pain   Right/Left Hip Right;Left    Right Hip Flexion 4+/5    Right Hip ABduction 4+/5    Right Hip ADduction 4+/5    Left Hip Flexion 4+/5    Left Hip ABduction 4+/5    Left Hip ADduction 4+/5    Right/Left Knee Right;Left    Right Knee Flexion 4+/5    Right Knee Extension 4+/5    Left Knee Flexion 4/5    Left Knee Extension 4+/5    Right/Left Ankle Right;Left    Right Ankle Dorsiflexion 4+/5    Right Ankle Plantar Flexion 4+/5    Left Ankle Dorsiflexion 4+/5    Left Ankle Plantar Flexion 4+/5      Palpation   Palpation comment no TTP over L shoulder; no apparent bruising      Standardized Balance Assessment   Five times sit to stand comments  trial 1 23.92 sec, trial 2 25.46 sec without UE support    carried over from 10/09/19     Berg Balance Test   Sit to Stand Able to stand without using hands and stabilize independently    Standing Unsupported Able to stand safely 2 minutes    Sitting with Back Unsupported but Feet Supported on Floor or Stool Able to sit safely and securely 2 minutes    Stand to Sit Sits safely with minimal use of hands    Transfers Able to transfer safely, minor use of hands    Standing Unsupported with Eyes Closed Able to stand 10 seconds safely  Standing Unsupported with Feet Together Able to place feet together independently and stand 1 minute safely    From Standing, Reach Forward with Outstretched Arm Can reach forward >5 cm safely (2")    From Standing Position, Pick up Object from [place] to pick up shoe safely and easily    From Standing Position, Turn to Look Behind Over each Shoulder Looks behind from both sides and weight shifts well    Turn 360 Degrees Able to turn 360 degrees safely but slowly    Standing Unsupported, Alternately Place Feet on Step/Stool Able to stand independently and complete 8 steps >20 seconds    Standing Unsupported, One Foot in [employer] balance while stepping or standing    Standing on One Leg Tries to lift leg/unable to hold 3 seconds but remains standing independently    Total Score 44    Berg comment: carried over from 10/09/19      Timed Up and Go Test   TUG Comments 18.76 sec with SPC, 17.58 sec without AD   carried over from 10/09/19              Vestibular Assessment - 11/02/19 0001      Positional Testing   Dix-Hallpike Dix-Hallpike Right    Sidelying Test Sidelying Right      Dix-Hallpike Right   Dix-Hallpike Right Duration 20 sec    Dix-Hallpike Right Symptoms Upbeat, right rotatory nystagmus      Sidelying Right   Sidelying Right Duration 15 sec    Sidelying Right Symptoms Upbeat, right rotatory nystagmus                    [facility] Adult PT Treatment/Exercise - 11/02/19 0001       Shoulder Exercises: Supine   External Rotation AAROM;Left;10 reps    External Rotation Limitations with wand to tolerance    Internal Rotation AAROM;Left;10 reps    Internal Rotation Limitations with wand to tolerance    Flexion AAROM;Left;5 reps    Flexion Limitations with wand   unable d/t pain          Vestibular Treatment/Exercise - 11/02/19 0001      Vestibular Treatment/Exercise   Vestibular Treatment Provided Canalith Repositioning    Canalith Repositioning Epley Manuever Right       EPLEY MANUEVER RIGHT   Number of Reps  1    Overall Response Improved Symptoms    Response Details  mild dizziness upon standing                 PT Education - 11/02/19 1723    Education Details edu on post-Epley precautions, discussion on current impairments; advised patient to f/u with MD about L shoulder pain if pain continues, edu on safety with ambulation and transfers using 4WW    Person(s) Educated Patient    Methods Explanation;Demonstration;Tactile cues;Verbal cues;Handout    Comprehension Verbalized understanding;Returned demonstration            PT Short Term Goals - 11/02/19 1727      PT SHORT TERM GOAL #1   Title Pt will be I and compliant with initial HEP.    Time 1    Period Weeks    Status Achieved    Target Date 09/04/19      PT SHORT TERM GOAL #2   Title Pt will decrease 5XSST time to 15 seconds or less.    Baseline 17s    Time 3    Period Weeks  Status On-going   10/09/19: 5xSTS trial 1 23.92 sec, trial 2 25.46 sec without UE support   Target Date 11/23/19      PT SHORT TERM GOAL #3   Title Pt will increase BERG score to at least 46/54.    Baseline 41/54    Time 3    Period Weeks    Status On-going   10/09/19: 44/46   Target Date 11/23/19             PT Long Term Goals - 11/02/19 1727      PT LONG TERM GOAL #1   Title Pt will decrease 5XSST time to 12.6s or less, in order to decrease fall risk to age bracket norm.    Baseline 17s     Time 6    Period Weeks    Status On-going   10/09/19: 5xSTS trial 1 23.92 sec, trial 2 25.46 sec without UE support   Target Date 12/14/19      PT LONG TERM GOAL #2   Title Pt will improve BERG score to >48/54, indicating significant improvement and decrease in fall risk.    Baseline 41/54    Time 6    Period Weeks    Status On-going   10/09/19: 44/56   Target Date 12/14/19      PT LONG TERM GOAL #3   Title Pt will perform TUG in 13.5 seconds or less to indicate decreased risk of falls.    Baseline to be completed at 2nd visit    Time 6    Period Weeks    Status Achieved   10/09/19: TUG 18.76 sec with SPC, 17.58 sec without AD   Target Date 12/14/19      PT LONG TERM GOAL #4   Title Pt will perform 10 squats with good form and posture with no UE support.    Time 6    Period Weeks    Status Partially Met   requiring minor cues to shift weight posteriorly   Target Date 12/14/19      PT LONG TERM GOAL #5   Title Patient to report 75% improvement in confidence to transfer from floor with/without UE use.    Time 6    Period Weeks    Status New    Target Date 12/14/19      Additional Long Term Goals   Additional Long Term Goals Yes      PT LONG TERM GOAL #6   Title Patient to report no dizziness with positional testing.    Time 6    Period Weeks    Status New    Target Date 12/14/19      PT LONG TERM GOAL #7   Title Patient to report ability to use L UE back to baseline function.    Time 6    Period Weeks    Status New    Target Date 12/14/19                 Plan - 11/02/19 1730    Clinical Impression Statement Patient returns to [facility] after a hiatus from therapy d/t experiencing 2 falls and sustaining a subdural hematoma. Patient reports that he has been cleared for therapy by his MD. Now ambulating with a 4WW and requiring consistent cueing for safe use with transfers and ambulation. Patient notes feeling weaker, having dizziness with transfers and bed  mobility, and L anterior shoulder pain since his fall. Patient able to demonstrate good LE strength  today, but R shoulder strength limited. Unable to really tolerate and strength testing with L shoulder d/t pain. Attempted some gentle AAROM with limited tolerance. Upon sitting up from supine ther-ex, patient reported dizziness and spinning sensation, and demonstrated some visible nystagmus. Patient with positive R sidelying test and R DH, demonstrating R upbeating torsional nystagmus. Patient tolerated R Epley and reported mild dizziness upon sitting but no nystagmus evident. Educated patient on post-Epley instructions- patient reported understanding. Escorted patient out of clinic for safety. Re-assessment today limited d/t patient's vertigo, thus plan to further assess balance and functional abilities next session. Patient would benefit from continued skilled PT services 2x/week for 6 weeks to address weakness, pain, imbalance, and dizziness.    Comorbidities Arthritis, CHF, HTN, DVT, Dyslipidemia, Unstable Angina, CAD    Rehab Potential Good    PT Frequency 2x / week    PT Duration 6 weeks    PT Treatment/Interventions ADLs/Self Care Home Management;Moist Heat;Iontophoresis '4mg'$ /ml Dexamethasone;Electrical Stimulation;Cryotherapy;Vasopneumatic Device;Passive range of motion;Dry needling;Energy conservation;Taping;Compression bandaging;Manual techniques;Patient/family education;Therapeutic exercise;Gait training;Stair training;Functional mobility training;Therapeutic activities;Balance training;Neuromuscular re-education;Vestibular;DME Instruction    PT Next Visit Plan reassess dizziness, TUG, BERG, 5xSTS    Consulted and Agree with Plan of Care Patient           Patient will benefit from skilled therapeutic intervention in order to improve the following deficits and impairments:  Decreased activity tolerance, Decreased balance, Decreased endurance, Abnormal gait, Decreased mobility, Impaired  flexibility, Decreased strength, Impaired UE functional use, Postural dysfunction, Pain, Difficulty walking, Cardiopulmonary status limiting activity, Impaired perceived functional ability, Improper body mechanics, Impaired tone, Decreased safety awareness, Increased fascial restricitons, Increased muscle spasms, Decreased range of motion, Dizziness  Visit Diagnosis: Unsteadiness on feet  Dizziness and giddiness  Acute pain of left shoulder  Muscle weakness (generalized)  Difficulty in walking, not elsewhere classified     Problem List Patient Active Problem List   Diagnosis Date Noted  . SDH (subdural hematoma) ([place]) 10/12/2019  . Hypokalemia   . OSA on CPAP   . Chest pain 08/11/2019  . Precordial chest pain   . Unstable angina ([place]) 07/26/2018  . BPH with urinary obstruction 02/24/2018  . Renal hemorrhage, right 10/26/2017  . S/P left rotator cuff repair 03/05/2016  . Anterior dislocation of left shoulder 11/29/2015  . History of implantable cardioverter-defibrillator (ICD) placement 03/16/2015  . ICD (implantable cardioverter-defibrillator) in place   . Chronic systolic heart failure ([place]) 12/31/2014  . Chronic systolic dysfunction of left ventricle 11/27/2014  . S/P drug eluting coronary stent placement, mLAD 07/06/14, Promus 07/07/2014  . At risk for sudden cardiac death, EF 25-30% with PVCs, with lifevest 07/07/2014  . Coronary artery disease involving native coronary artery of native heart with unstable angina pectoris ([place])   . Cardiomyopathy, ischemic   . History of DVT (deep vein thrombosis), on coumadin   . NSTEMI (non-ST elevated myocardial infarction) ([place]) 07/05/2014  . CAD (coronary artery disease)   . Recurrent acute deep vein thrombosis (DVT) of lower extremity ([place])   . Dyslipidemia   . GERD (gastroesophageal reflux disease)   . Anterior myocardial infarction ([place])   . Lupus anticoagulant positive     Janene Harvey, [place], DPT 11/02/19 5:44  PM   [facility] [address]  [place], [place], [address] Phone: [phone]   Fax:  [phone]  Name: ELMIN WIEDERHOLT MRN: [id number] Date of Birth: 01-Dec-1945

## 2019-11-03 ENCOUNTER — Other Ambulatory Visit: Payer: Self-pay | Admitting: Internal Medicine

## 2019-11-07 ENCOUNTER — Encounter: Payer: Self-pay | Admitting: Physical Therapy

## 2019-11-07 ENCOUNTER — Other Ambulatory Visit: Payer: Self-pay

## 2019-11-07 ENCOUNTER — Ambulatory Visit: Payer: PPO | Admitting: Physical Therapy

## 2019-11-07 DIAGNOSIS — R262 Difficulty in walking, not elsewhere classified: Secondary | ICD-10-CM

## 2019-11-07 DIAGNOSIS — M6281 Muscle weakness (generalized): Secondary | ICD-10-CM

## 2019-11-07 DIAGNOSIS — R2681 Unsteadiness on feet: Secondary | ICD-10-CM | POA: Diagnosis not present

## 2019-11-07 DIAGNOSIS — R293 Abnormal posture: Secondary | ICD-10-CM

## 2019-11-07 DIAGNOSIS — M25512 Pain in left shoulder: Secondary | ICD-10-CM

## 2019-11-07 DIAGNOSIS — R42 Dizziness and giddiness: Secondary | ICD-10-CM

## 2019-11-07 NOTE — Therapy (Signed)
[facility] [address]  [place], [place], [address] Phone: [phone]   Fax:  [phone]  Physical Therapy Treatment  Patient Details  Name: Kevin Hart MRN: [id number] Date of Birth: January 19, 1946 Referring Provider (PT): Leanor Kail, [place]   Encounter Date: 11/07/2019   PT End of Session - 11/07/19 1523    Visit Number 11    Number of Visits 22    Date for PT Re-Evaluation 12/14/19    Authorization Type [employer]    PT Start Time 1449    PT Stop Time 1521    PT Time Calculation (min) 32 min    Activity Tolerance Patient tolerated treatment well    Behavior During Therapy [facility] for tasks assessed/performed           Past Medical History:  Diagnosis Date  . Arthritis   . Bladder stone 10/26/2017  . BPH (benign prostatic hyperplasia)    takes Flomax daily  . CAD (coronary artery disease)    a. prior LAD stenting;  b. 06/2014 NSTEMI/PCI: LM nl, LAD 30p, 29mISR (3.0x20 Promus DES), LCX nl, RCA nondom, nl, EF 25-30% ant, apical, dist inf [place].  . Cataracts, bilateral    immature  . Chronic systolic CHF (congestive heart failure) ([facility] 12/31/2014  . DVT (deep venous thrombosis) (HCC)    RECURRENT left leg  . Dyslipidemia   . GERD (gastroesophageal reflux disease)   . Gout   . History of kidney stones   . Hypertension   . Ischemic cardiomyopathy 07/06/2014  . Lupus anticoagulant positive   . Myocardial infarct ([facility] 2000  . Myocardial infarct ([facility] 2016  . S/P drug eluting coronary stent placement, 07/06/14, Promus 07/07/2014  . Sleep apnea    biPaP  . Subarachnoid bleed ([facility] 2015   after falling and hitting his head    Past Surgical History:  Procedure Laterality Date  . COLONOSCOPY    . EP IMPLANTABLE DEVICE [name] 11/27/2014   [employer] Fortify Assura VR  implanted by Dr ARayann Hemanfor primary prevention  . LEFT HEART CATH AND CORONARY ANGIOGRAPHY [name] 07/29/2018   Procedure: LEFT  HEART CATH AND CORONARY ANGIOGRAPHY;  Surgeon: CSherren Mocha MD;  [facility];  Service: Cardiovascular;  Laterality: [name];  . LEFT HEART CATH AND CORONARY ANGIOGRAPHY [name] 08/14/2019   Procedure: LEFT HEART CATH AND CORONARY ANGIOGRAPHY;  Surgeon: KTroy Sine MD;  [facility];  Service: Cardiovascular;  Laterality: [name];  . LEFT HEART CATHETERIZATION WITH CORONARY ANGIOGRAM [name] 07/06/2014   Procedure: LEFT HEART CATHETERIZATION WITH CORONARY ANGIOGRAM;  Surgeon: Peter M JMartinique MD;  [facility];  Service: Cardiovascular;  Laterality: [name];  . PERCUTANEOUS CORONARY STENT INTERVENTION (PCI-S) Right 07/06/2014   Procedure: PERCUTANEOUS CORONARY STENT INTERVENTION (PCI-S);  Surgeon: Peter M JMartinique MD;  [facility];  Service: Cardiovascular;  Laterality: Right;  . SHOULDER ARTHROSCOPY WITH SUBACROMIAL DECOMPRESSION Left 03/05/2016   Procedure: SHOULDER ARTHROSCOPY ROTATOR CUFF REPAIR WITH SUBACROMIAL DECOMPRESSION;  Surgeon: JTania Ade MD;  [facility]  Service: Orthopedics;  Laterality: Left;  SHOULDER ARTHROSCOPY ROTATOR CUFF REPAIR WITH SUBACROMIAL DECOMPRESSION  . SHOULDER CLOSED REDUCTION Left 11/29/2015   Procedure: CLOSED REDUCTION SHOULDER;  Surgeon: JTania Ade MD;  [place]: [facility]  Service: Orthopedics;  Laterality: Left;  . TRANSURETHRAL RESECTION OF PROSTATE [name] 02/24/2018   Procedure: TRANSURETHRAL RESECTION OF THE PROSTATE (TURP);  Surgeon: WIrine Seal MD;  Location: [facility];  Service: Urology;  Laterality: [name];  . WRIST SURGERY Bilateral    plates and screws     There were no vitals filed for this visit.   Subjective Assessment - 11/07/19 1450    Subjective Feeling "much better." Still gets a little dizzy when he sits up in bed and occasionally when rolling, but it is improved. Shoulder is better but still unable to lift it himself. Was able to get down on his knees the other day and stood up without a problem.     Pertinent History DVT LLE 20 years ago, unstable angina (hospitalized 2 weeks ago), 2 MI's, scoliosis    Patient Stated Goals Pt would like to get better at getting up off of the floor and be steadier when he is walking.    Currently in Pain? No/denies              [facility] PT Assessment - 11/07/19 0001      Standardized Balance Assessment   Five times sit to stand comments  21.61 sec without UEs      Timed Up and Go Test   TUG Comments 21.65 sec with 4WW               Vestibular Assessment - 11/07/19 0001      Positional Testing   Sidelying Test Sidelying Right      Dix-Hallpike Right   Dix-Hallpike Right Duration 20 sec   2nd trial- no nystagmus or dizziness   Dix-Hallpike Right Symptoms Upbeat, right rotatory nystagmus      Sidelying Right   Sidelying Right Duration 15 sec    Sidelying Right Symptoms Upbeat, right rotatory nystagmus   dizziness and nystagmus upon sitting                    Vestibular Treatment/Exercise - 11/07/19 0001       EPLEY MANUEVER RIGHT   Number of Reps  1    Overall Response No change    Response Details  L upbeating torsional nystagmus upon L head turn                 PT Education - 11/07/19 1522    Education Details reviewed edu on post-Epley precautions, discussion on patient's test results and clinical correlation    Person(s) Educated Patient    Methods Explanation;Demonstration;Tactile cues;Verbal cues;Handout    Comprehension Verbalized understanding;Returned demonstration            PT Short Term Goals - 11/02/19 1727      PT SHORT TERM GOAL #1   Title Pt will be I and compliant with initial HEP.    Time 1    Period Weeks    Status Achieved    Target Date 09/04/19      PT SHORT TERM GOAL #2   Title Pt will decrease 5XSST time to 15 seconds or less.    Baseline 17s    Time 3    Period Weeks    Status On-going   10/09/19: 5xSTS trial 1 23.92 sec, trial 2 25.46 sec without UE support   Target  Date 11/23/19      PT SHORT TERM GOAL #3   Title Pt will increase BERG score to at least 46/54.    Baseline 41/54    Time 3    Period Weeks    Status On-going   10/09/19: 44/46   Target Date 11/23/19  PT Long Term Goals - 11/02/19 1727      PT LONG TERM GOAL #1   Title Pt will decrease 5XSST time to 12.6s or less, in order to decrease fall risk to age bracket norm.    Baseline 17s    Time 6    Period Weeks    Status On-going   10/09/19: 5xSTS trial 1 23.92 sec, trial 2 25.46 sec without UE support   Target Date 12/14/19      PT LONG TERM GOAL #2   Title Pt will improve BERG score to >48/54, indicating significant improvement and decrease in fall risk.    Baseline 41/54    Time 6    Period Weeks    Status On-going   10/09/19: 44/56   Target Date 12/14/19      PT LONG TERM GOAL #3   Title Pt will perform TUG in 13.5 seconds or less to indicate decreased risk of falls.    Baseline to be completed at 2nd visit    Time 6    Period Weeks    Status Achieved   10/09/19: TUG 18.76 sec with SPC, 17.58 sec without AD   Target Date 12/14/19      PT LONG TERM GOAL #4   Title Pt will perform 10 squats with good form and posture with no UE support.    Time 6    Period Weeks    Status Partially Met   requiring minor cues to shift weight posteriorly   Target Date 12/14/19      PT LONG TERM GOAL #5   Title Patient to report 75% improvement in confidence to transfer from floor with/without UE use.    Time 6    Period Weeks    Status New    Target Date 12/14/19      Additional Long Term Goals   Additional Long Term Goals Yes      PT LONG TERM GOAL #6   Title Patient to report no dizziness with positional testing.    Time 6    Period Weeks    Status New    Target Date 12/14/19      PT LONG TERM GOAL #7   Title Patient to report ability to use L UE back to baseline function.    Time 6    Period Weeks    Status New    Target Date 12/14/19                  Plan - 11/07/19 1523    Clinical Impression Statement Patient reported feeling "much better" with his dizziness since last session, however still noting some dizziness with supine>sit and occasionally with rolling. Notes that he was able to get down on his knees the other day and stood up without a problem. Re-assessed R sidelying test and R DH which were positive for dizziness and R torsional upbeating nystagmus. Patient was treated with R [place] and upon L head turn of this maneuver, demonstrated L upbeating torsional nystagmus, indicating L posterior canal also affected. Reassessed R DH, which was negative for nystagmus and dizziness. Patient was re-educated on post-Epley precautions. Plan to address L ear next session. Assessed balance for remainder of session, with patient demonstrating a slight improvement in 5xSTS time but a slight decline in TUG time. Educated patient on clinical findings. Patient reported understanding and without complaints at end of session.    Comorbidities Arthritis, CHF, HTN, DVT, Dyslipidemia, Unstable Angina, CAD  Rehab Potential Good    PT Frequency 2x / week    PT Duration 6 weeks    PT Treatment/Interventions ADLs/Self Care Home Management;Moist Heat;Iontophoresis 43m/ml Dexamethasone;Electrical Stimulation;Cryotherapy;Vasopneumatic Device;Passive range of motion;Dry needling;Energy conservation;Taping;Compression bandaging;Manual techniques;Patient/family education;Therapeutic exercise;Gait training;Stair training;Functional mobility training;Therapeutic activities;Balance training;Neuromuscular re-education;Vestibular;DME Instruction    PT Next Visit Plan reassess dizziness, BERG    Consulted and Agree with Plan of Care Patient           Patient will benefit from skilled therapeutic intervention in order to improve the following deficits and impairments:  Decreased activity tolerance, Decreased balance, Decreased endurance, Abnormal gait,  Decreased mobility, Impaired flexibility, Decreased strength, Impaired UE functional use, Postural dysfunction, Pain, Difficulty walking, Cardiopulmonary status limiting activity, Impaired perceived functional ability, Improper body mechanics, Impaired tone, Decreased safety awareness, Increased fascial restricitons, Increased muscle spasms, Decreased range of motion, Dizziness  Visit Diagnosis: Unsteadiness on feet  Dizziness and giddiness  Acute pain of left shoulder  Muscle weakness (generalized)  Difficulty in walking, not elsewhere classified  Abnormal posture     Problem List Patient Active Problem List   Diagnosis Date Noted  . SDH (subdural hematoma) ([facility] 10/12/2019  . Hypokalemia   . OSA on CPAP   . Chest pain 08/11/2019  . Precordial chest pain   . Unstable angina ([facility] 07/26/2018  . BPH with urinary obstruction 02/24/2018  . Renal hemorrhage, right 10/26/2017  . S/P left rotator cuff repair 03/05/2016  . Anterior dislocation of left shoulder 11/29/2015  . History of implantable cardioverter-defibrillator (ICD) placement 03/16/2015  . ICD (implantable cardioverter-defibrillator) in place   . Chronic systolic heart failure ([facility] 12/31/2014  . Chronic systolic dysfunction of left ventricle 11/27/2014  . S/P drug eluting coronary stent placement, mLAD 07/06/14, Promus 07/07/2014  . At risk for sudden cardiac death, EF 25-30% with PVCs, with lifevest 07/07/2014  . Coronary artery disease involving native coronary artery of native heart with unstable angina pectoris ([facility]   . Cardiomyopathy, ischemic   . History of DVT (deep vein thrombosis), on coumadin   . NSTEMI (non-ST elevated myocardial infarction) ([facility] 07/05/2014  . CAD (coronary artery disease)   . Recurrent acute deep vein thrombosis (DVT) of lower extremity ([facility]   . Dyslipidemia   . GERD (gastroesophageal reflux disease)   . Anterior myocardial infarction ([facility]   . Lupus anticoagulant positive       YJanene Harvey PVir[place]ia DPT 11/07/19 4:25 PM   [facility] [address] [place] [address]Phone: [phone]  Fax:  [phone] Name: Kevin WESTFALLMRN: [id number] of Birth: 207/17/1947

## 2019-11-09 ENCOUNTER — Encounter: Payer: PPO | Admitting: Physical Therapy

## 2019-11-09 DIAGNOSIS — Z7901 Long term (current) use of anticoagulants: Secondary | ICD-10-CM | POA: Diagnosis not present

## 2019-11-09 DIAGNOSIS — R5383 Other fatigue: Secondary | ICD-10-CM | POA: Diagnosis not present

## 2019-11-09 DIAGNOSIS — I951 Orthostatic hypotension: Secondary | ICD-10-CM | POA: Diagnosis not present

## 2019-11-09 DIAGNOSIS — Z79899 Other long term (current) drug therapy: Secondary | ICD-10-CM | POA: Diagnosis not present

## 2019-11-09 DIAGNOSIS — D539 Nutritional anemia, unspecified: Secondary | ICD-10-CM | POA: Diagnosis not present

## 2019-11-09 DIAGNOSIS — E559 Vitamin D deficiency, unspecified: Secondary | ICD-10-CM | POA: Diagnosis not present

## 2019-11-09 DIAGNOSIS — E7800 Pure hypercholesterolemia, unspecified: Secondary | ICD-10-CM | POA: Diagnosis not present

## 2019-11-09 DIAGNOSIS — Z20822 Contact with and (suspected) exposure to covid-19: Secondary | ICD-10-CM | POA: Diagnosis not present

## 2019-11-09 DIAGNOSIS — R42 Dizziness and giddiness: Secondary | ICD-10-CM | POA: Diagnosis not present

## 2019-11-09 DIAGNOSIS — I10 Essential (primary) hypertension: Secondary | ICD-10-CM | POA: Diagnosis not present

## 2019-11-09 DIAGNOSIS — E291 Testicular hypofunction: Secondary | ICD-10-CM | POA: Diagnosis not present

## 2019-11-09 DIAGNOSIS — R300 Dysuria: Secondary | ICD-10-CM | POA: Diagnosis not present

## 2019-11-20 ENCOUNTER — Ambulatory Visit (INDEPENDENT_AMBULATORY_CARE_PROVIDER_SITE_OTHER): Payer: PPO

## 2019-11-20 DIAGNOSIS — Z95810 Presence of automatic (implantable) cardiac defibrillator: Secondary | ICD-10-CM

## 2019-11-20 DIAGNOSIS — I5022 Chronic systolic (congestive) heart failure: Secondary | ICD-10-CM | POA: Diagnosis not present

## 2019-11-20 NOTE — Progress Notes (Signed)
EPIC Encounter for ICM Monitoring  Patient Name: Kevin Hart is a 74 y.o. male Date: 11/20/2019 Primary Care Physican: [facility] [place] Electrophysiologist: [place] 7/2/2021Office Weight:223lbs   Spoke with patient.  He reports Furosemide was decreased to 20 mg last week due to dizziness and recent ER visit 10/25/2019 for fall.  The fall resulted in subdural hematoma.   CorvueThoracic impedance suggesting possible fluid accumulation starting 11/18/2019.  Prescribed:  Furosemide 40 mg 1 tablet daily.11/20/2019 Patient reports dosage decreased to 20 mg daily due to dizziness.  Potassium 20 mEq take 1 tablet daily  Labs 10/13/2019 Creatinine 1.11, BUN 17, Potassium 2.9, Sodium 144, GFR >60 10/12/2019 Creatinine 1.34, BUN 22, Potassium 3.6, Sodium 141, GFR 51-60  08/15/2019 Creatinine1.03, BUN15, Potassium4.2, Sodium140, GFR>60 08/14/2019 Creatinine1.04, BUN14, Potassium4.3, Sodium140, GFR>60  08/12/2019 Creatinine0.99, BUN14, Potassium3.3, Sodium143, GFR>60  08/11/2019 Creatinine1.08, BUN13, Potassium3.8, Sodium139, GFR>60 A complete set of results can be found in Results Review.  Recommendations: Reinforced limiting salt intake to < 2000 mg daily and fluid intake to 64 oz daily.  Encouraged to call if experiencing fluid symptoms.  Follow-up plan: ICM clinic phone appointment on 11/28/2019 to recheck fluid levels.   91 day device clinic remote transmission 12/25/2019.    EP/Cardiology Office Visits: 11/29/2019 with Dr. Martinique.    Copy of ICM check sent to Dr. Rayann Heman and Dr Martinique for review and recommendations if needed.   3 month ICM trend: 11/20/2019    1 Year ICM trend:       Rosalene Billings, RN 11/20/2019 9:55 AM

## 2019-11-21 ENCOUNTER — Encounter: Payer: Self-pay | Admitting: Physical Therapy

## 2019-11-21 ENCOUNTER — Other Ambulatory Visit: Payer: Self-pay

## 2019-11-21 ENCOUNTER — Ambulatory Visit: Payer: PPO | Admitting: Physical Therapy

## 2019-11-21 VITALS — BP 70/50 | HR 56

## 2019-11-21 DIAGNOSIS — R2681 Unsteadiness on feet: Secondary | ICD-10-CM

## 2019-11-21 DIAGNOSIS — M6281 Muscle weakness (generalized): Secondary | ICD-10-CM

## 2019-11-21 DIAGNOSIS — R42 Dizziness and giddiness: Secondary | ICD-10-CM

## 2019-11-21 DIAGNOSIS — R262 Difficulty in walking, not elsewhere classified: Secondary | ICD-10-CM

## 2019-11-21 DIAGNOSIS — R293 Abnormal posture: Secondary | ICD-10-CM

## 2019-11-21 DIAGNOSIS — M25512 Pain in left shoulder: Secondary | ICD-10-CM

## 2019-11-21 NOTE — Therapy (Signed)
[facility] [address]  [place], [place], [address] Phone: [phone]   Fax:  [phone]  Physical Therapy Treatment  Patient Details  Name: Kevin Hart MRN: [id number] Date of Birth: 25-Mar-1945 Referring Provider (PT): Leanor Kail, [place]   Encounter Date: 11/21/2019   PT End of Session - 11/21/19 1542    Visit Number 12    Number of Visits 22    Date for PT Re-Evaluation 12/14/19    Authorization Type [employer]    PT Start Time 1449    PT Stop Time 1534    PT Time Calculation (min) 45 min    Equipment Utilized During Treatment Gait belt    Activity Tolerance Patient tolerated treatment well    Behavior During Therapy [facility] for tasks assessed/performed           Past Medical History:  Diagnosis Date  . Arthritis   . Bladder stone 10/26/2017  . BPH (benign prostatic hyperplasia)    takes Flomax daily  . CAD (coronary artery disease)    a. prior LAD stenting;  b. 06/2014 NSTEMI/PCI: LM nl, LAD 30p, 40m ISR (3.0x20 Promus DES), LCX nl, RCA nondom, nl, EF 25-30% ant, apical, dist inf [place].  . Cataracts, bilateral    immature  . Chronic systolic CHF (congestive heart failure) ([place]) 12/31/2014  . DVT (deep venous thrombosis) ([facility])    RECURRENT left leg  . Dyslipidemia   . GERD (gastroesophageal reflux disease)   . Gout   . History of kidney stones   . Hypertension   . Ischemic cardiomyopathy 07/06/2014  . Lupus anticoagulant positive   . Myocardial infarct ([place]) 2000  . Myocardial infarct ([place]) 2016  . S/P drug eluting coronary stent placement, 07/06/14, Promus 07/07/2014  . Sleep apnea    biPaP  . Subarachnoid bleed ([employer]) 2015   after falling and hitting his head    Past Surgical History:  Procedure Laterality Date  . COLONOSCOPY    . EP IMPLANTABLE DEVICE [name] 11/27/2014   [employer] Fortify Assura VR  implanted by Dr Rayann Heman for primary prevention  . LEFT HEART CATH AND  CORONARY ANGIOGRAPHY [name] 07/29/2018   Procedure: LEFT HEART CATH AND CORONARY ANGIOGRAPHY;  Surgeon: Sherren Mocha, MD;  Location: [place] [facility];  Service: Cardiovascular;  Laterality: [name];  . LEFT HEART CATH AND CORONARY ANGIOGRAPHY [name] 08/14/2019   Procedure: LEFT HEART CATH AND CORONARY ANGIOGRAPHY;  Surgeon: Troy Sine, MD;  Location: [place] [facility];  Service: Cardiovascular;  Laterality: [name];  . LEFT HEART CATHETERIZATION WITH CORONARY ANGIOGRAM [name] 07/06/2014   Procedure: LEFT HEART CATHETERIZATION WITH CORONARY ANGIOGRAM;  Surgeon: Peter M Martinique, MD;  Location: [facility];  Service: Cardiovascular;  Laterality: [name];  . PERCUTANEOUS CORONARY STENT INTERVENTION (PCI-S) Right 07/06/2014   Procedure: PERCUTANEOUS CORONARY STENT INTERVENTION (PCI-S);  Surgeon: Peter M Martinique, MD;  Location: [facility] CATH LAB;  Service: Cardiovascular;  Laterality: Right;  . SHOULDER ARTHROSCOPY WITH SUBACROMIAL DECOMPRESSION Left 03/05/2016   Procedure: SHOULDER ARTHROSCOPY ROTATOR CUFF REPAIR WITH SUBACROMIAL DECOMPRESSION;  Surgeon: Tania Ade, MD;  Location: [facility];  Service: Orthopedics;  Laterality: Left;  SHOULDER ARTHROSCOPY ROTATOR CUFF REPAIR WITH SUBACROMIAL DECOMPRESSION  . SHOULDER CLOSED REDUCTION Left 11/29/2015   Procedure: CLOSED REDUCTION SHOULDER;  Surgeon: Tania Ade, MD;  Location: [facility];  Service: Orthopedics;  Laterality: Left;  . TRANSURETHRAL RESECTION OF PROSTATE [name] 02/24/2018   Procedure: TRANSURETHRAL RESECTION OF THE PROSTATE (  TURP);  Surgeon: Irine Seal, MD;  Location: [facility];  Service: Urology;  Laterality: [name];  . WRIST SURGERY Bilateral    plates and screws     Vitals:   11/21/19 1456 11/21/19 1459  BP: (!) 90/58 (!) 70/50  Pulse: (!) 56   SpO2: 96%      Subjective Assessment - 11/21/19 1447    Subjective Reports that he is feeling much better with his dizziness, but does get dizzy when he stands up. MD has cut his Lasix in half d/t fluid problems and  othostasis.    Pertinent History DVT LLE 20 years ago, unstable angina (hospitalized 2 weeks ago), 2 MI's, scoliosis    Patient Stated Goals Pt would like to get better at getting up off of the floor and be steadier when he is walking.    Currently in Pain? No/denies              [facility] PT Assessment - 11/21/19 0001      Berg Balance Test   Sit to Stand Able to stand without using hands and stabilize independently    Standing Unsupported Able to stand safely 2 minutes    Sitting with Back Unsupported but Feet Supported on Floor or Stool Able to sit safely and securely 2 minutes    Stand to Sit Sits safely with minimal use of hands    Transfers Able to transfer safely, minor use of hands    Standing Unsupported with Eyes Closed Able to stand 10 seconds safely    Standing Unsupported with Feet Together Able to place feet together independently and stand 1 minute safely    From Standing, Reach Forward with Outstretched Arm Can reach forward >12 cm safely (5")    From Standing Position, Pick up Object from [place] to pick up shoe safely and easily    From Standing Position, Turn to Look Behind Over each Shoulder Looks behind one side only/other side shows less weight shift    Turn 360 Degrees Able to turn 360 degrees safely but slowly    Standing Unsupported, Alternately Place Feet on Step/Stool Able to stand independently and safely and complete 8 steps in 20 seconds    Standing Unsupported, One Foot in Front Needs help to step but can hold 15 seconds    Standing on One Leg Unable to try or needs assist to prevent fall    Total Score 45                         [facility] Adult PT Treatment/Exercise - 11/21/19 0001      Neuro Re-ed    Neuro Re-ed Details  alt toe tap on cone 20x with 2 finger support on counter; 1/2 tandem stance 30" each without UE support                  PT Education - 11/21/19 1540    Education Details update to HEP; edu on use of ankle pumps  to improve lightheadedness with transfers, edu on objective measures and clinical relevance, advised to use 4WW with ambulation until BP becomes more steady    Person(s) Educated Patient    Methods Explanation;Demonstration;Tactile cues;Verbal cues;Handout    Comprehension Verbalized understanding;Returned demonstration            PT Short Term Goals - 11/02/19 1727      PT SHORT TERM GOAL #1   Title Pt will be I and compliant with  initial HEP.    Time 1    Period Weeks    Status Achieved    Target Date 09/04/19      PT SHORT TERM GOAL #2   Title Pt will decrease 5XSST time to 15 seconds or less.    Baseline 17s    Time 3    Period Weeks    Status On-going   10/09/19: 5xSTS trial 1 23.92 sec, trial 2 25.46 sec without UE support   Target Date 11/23/19      PT SHORT TERM GOAL #3   Title Pt will increase BERG score to at least 46/54.    Baseline 41/54    Time 3    Period Weeks    Status On-going   10/09/19: 44/46   Target Date 11/23/19             PT Long Term Goals - 11/21/19 1550      PT LONG TERM GOAL #1   Title Pt will decrease 5XSST time to 12.6s or less, in order to decrease fall risk to age bracket norm.    Baseline 17s    Time 6    Period Weeks    Status On-going   10/09/19: 5xSTS trial 1 23.92 sec, trial 2 25.46 sec without UE support     PT LONG TERM GOAL #2   Title Pt will improve BERG score to >48/54, indicating significant improvement and decrease in fall risk.    Baseline 41/54    Time 6    Period Weeks    Status On-going   11/21/19: 45/56     PT LONG TERM GOAL #3   Title Pt will perform TUG in 13.5 seconds or less to indicate decreased risk of falls.    Baseline to be completed at 2nd visit    Time 6    Period Weeks    Status Achieved   10/09/19: TUG 18.76 sec with SPC, 17.58 sec without AD     PT LONG TERM GOAL #4   Title Pt will perform 10 squats with good form and posture with no UE support.    Time 6    Period Weeks    Status  Partially Met   requiring minor cues to shift weight posteriorly     PT LONG TERM GOAL #5   Title Patient to report 75% improvement in confidence to transfer from floor with/without UE use.    Time 6    Period Weeks    Status On-going      PT LONG TERM GOAL #6   Title Patient to report no dizziness with positional testing.    Time 6    Period Weeks    Status On-going      PT LONG TERM GOAL #7   Title Patient to report ability to use L UE back to baseline function.    Time 6    Period Weeks    Status On-going                 Plan - 11/21/19 1542    Clinical Impression Statement Patient today reporting improvement in "spinning" dizziness, however noting some lightheadedness with transfers. Noting that his MD advised him to decrease his Lasix dosage d/t orthostasis. Patient with slightly low BP in sitting, which further dropped by [id number] systolic, indicating persisting orthostasis. Patient agreeable to continue with session as lightheadedness was short-lasting, but plan to contact patient's MD about today's vital signs. Patient scored 45/56 on Berg  today, indicating a slight improvement in balance, however patient still at increased risk of falls. Educated patient on use of 4WW for ambulation until BP becomes more stable- patient agreeable. Patient was able to perform static and dynamic balance training with good safety/stability. Patient was able to ambulate out of clinic without lightheadedness and without complaints at end of session. Patient progressing towards goals, despite dizziness/lightheadedness.    Comorbidities Arthritis, CHF, HTN, DVT, Dyslipidemia, Unstable Angina, CAD    Rehab Potential Good    PT Frequency 2x / week    PT Duration 6 weeks    PT Treatment/Interventions ADLs/Self Care Home Management;Moist Heat;Iontophoresis 4mg /ml Dexamethasone;Electrical Stimulation;Cryotherapy;Vasopneumatic Device;Passive range of motion;Dry needling;Energy  conservation;Taping;Compression bandaging;Manual techniques;Patient/family education;Therapeutic exercise;Gait training;Stair training;Functional mobility training;Therapeutic activities;Balance training;Neuromuscular re-education;Vestibular;DME Instruction    PT Next Visit Plan reassess dizziness, progress UE/LE strengthening and static/dynamic balance    Consulted and Agree with Plan of Care Patient           Patient will benefit from skilled therapeutic intervention in order to improve the following deficits and impairments:  Decreased activity tolerance, Decreased balance, Decreased endurance, Abnormal gait, Decreased mobility, Impaired flexibility, Decreased strength, Impaired UE functional use, Postural dysfunction, Pain, Difficulty walking, Cardiopulmonary status limiting activity, Impaired perceived functional ability, Improper body mechanics, Impaired tone, Decreased safety awareness, Increased fascial restricitons, Increased muscle spasms, Decreased range of motion, Dizziness  Visit Diagnosis: Unsteadiness on feet  Dizziness and giddiness  Acute pain of left shoulder  Muscle weakness (generalized)  Difficulty in walking, not elsewhere classified  Abnormal posture     Problem List Patient Active Problem List   Diagnosis Date Noted  . SDH (subdural hematoma) ([place]) 10/12/2019  . Hypokalemia   . OSA on CPAP   . Chest pain 08/11/2019  . Precordial chest pain   . Unstable angina ([place]) 07/26/2018  . BPH with urinary obstruction 02/24/2018  . Renal hemorrhage, right 10/26/2017  . S/P left rotator cuff repair 03/05/2016  . Anterior dislocation of left shoulder 11/29/2015  . History of implantable cardioverter-defibrillator (ICD) placement 03/16/2015  . ICD (implantable cardioverter-defibrillator) in place   . Chronic systolic heart failure ([place]) 12/31/2014  . Chronic systolic dysfunction of left ventricle 11/27/2014  . S/P drug eluting coronary stent placement, mLAD  07/06/14, Promus 07/07/2014  . At risk for sudden cardiac death, EF 25-30% with PVCs, with lifevest 07/07/2014  . Coronary artery disease involving native coronary artery of native heart with unstable angina pectoris ([place])   . Cardiomyopathy, ischemic   . History of DVT (deep vein thrombosis), on coumadin   . NSTEMI (non-ST elevated myocardial infarction) ([place]) 07/05/2014  . CAD (coronary artery disease)   . Recurrent acute deep vein thrombosis (DVT) of lower extremity ([place])   . Dyslipidemia   . GERD (gastroesophageal reflux disease)   . Anterior myocardial infarction ([place])   . Lupus anticoagulant positive      Janene Harvey, [place], DPT 11/21/19 3:53 PM   [facility] [address]  [place], [place], [address] Phone: [phone]   Fax:  [phone]  Name: Kevin Hart MRN: [id number] Date of Birth: 04-Sep-1945

## 2019-11-23 ENCOUNTER — Other Ambulatory Visit: Payer: Self-pay

## 2019-11-23 ENCOUNTER — Ambulatory Visit: Payer: PPO | Attending: Physician Assistant | Admitting: Physical Therapy

## 2019-11-23 ENCOUNTER — Encounter: Payer: Self-pay | Admitting: Physical Therapy

## 2019-11-23 VITALS — BP 87/60 | HR 91

## 2019-11-23 DIAGNOSIS — R262 Difficulty in walking, not elsewhere classified: Secondary | ICD-10-CM | POA: Diagnosis not present

## 2019-11-23 DIAGNOSIS — M6281 Muscle weakness (generalized): Secondary | ICD-10-CM | POA: Diagnosis not present

## 2019-11-23 DIAGNOSIS — R42 Dizziness and giddiness: Secondary | ICD-10-CM | POA: Diagnosis not present

## 2019-11-23 DIAGNOSIS — R293 Abnormal posture: Secondary | ICD-10-CM | POA: Diagnosis not present

## 2019-11-23 DIAGNOSIS — R2681 Unsteadiness on feet: Secondary | ICD-10-CM | POA: Diagnosis not present

## 2019-11-23 DIAGNOSIS — M25512 Pain in left shoulder: Secondary | ICD-10-CM

## 2019-11-23 NOTE — Therapy (Signed)
[facility] [address]  [place], [place], [address] Phone: [phone]   Fax:  [phone]  Physical Therapy Treatment  Patient Details  Name: Kevin Hart MRN: [id number] Date of Birth: Apr 26, 1945 Referring Provider (PT): Leanor Kail, [place]   Encounter Date: 11/23/2019   PT End of Session - 11/23/19 1609    Visit Number 13    Number of Visits 22    Date for PT Re-Evaluation 12/14/19    Authorization Type [employer]    PT Start Time 1527    PT Stop Time 1608    PT Time Calculation (min) 41 min    Activity Tolerance Patient tolerated treatment well;Patient limited by pain    Behavior During Therapy [facility] for tasks assessed/performed           Past Medical History:  Diagnosis Date  . Arthritis   . Bladder stone 10/26/2017  . BPH (benign prostatic hyperplasia)    takes Flomax daily  . CAD (coronary artery disease)    a. prior LAD stenting;  b. 06/2014 NSTEMI/PCI: LM nl, LAD 30p, 2m ISR (3.0x20 Promus DES), LCX nl, RCA nondom, nl, EF 25-30% ant, apical, dist inf [place].  . Cataracts, bilateral    immature  . Chronic systolic CHF (congestive heart failure) ([place]) 12/31/2014  . DVT (deep venous thrombosis) (H[facility])    RECURRENT left leg  . Dyslipidemia   . GERD (gastroesophageal reflux disease)   . Gout   . History of kidney stones   . Hypertension   . Ischemic cardiomyopathy 07/06/2014  . Lupus anticoagulant positive   . Myocardial infarct ([place]) 2000  . Myocardial infarct ([place]) 2016  . S/P drug eluting coronary stent placement, 07/06/14, Promus 07/07/2014  . Sleep apnea    biPaP  . Subarachnoid bleed ([place]) 2015   after falling and hitting his head    Past Surgical History:  Procedure Laterality Date  . COLONOSCOPY    . EP IMPLANTABLE DEVICE [name] 11/27/2014   [employer] Fortify Assura VR  implanted by Dr Rayann Heman for primary prevention  . LEFT HEART CATH AND CORONARY ANGIOGRAPHY [name]  07/29/2018   Procedure: LEFT HEART CATH AND CORONARY ANGIOGRAPHY;  Surgeon: Sherren Mocha, MD;  Location: [place] [facility];  Service: Cardiovascular;  Laterality: [name];  . LEFT HEART CATH AND CORONARY ANGIOGRAPHY [name] 08/14/2019   Procedure: LEFT HEART CATH AND CORONARY ANGIOGRAPHY;  Surgeon: Troy Sine, MD;  Location: [place] [facility];  Service: Cardiovascular;  Laterality: [name];  . LEFT HEART CATHETERIZATION WITH CORONARY ANGIOGRAM [name] 07/06/2014   Procedure: LEFT HEART CATHETERIZATION WITH CORONARY ANGIOGRAM;  Surgeon: Peter M Martinique, MD;  Location: [facility] CATH LAB;  Service: Cardiovascular;  Laterality: [name];  . PERCUTANEOUS CORONARY STENT INTERVENTION (PCI-S) Right 07/06/2014   Procedure: PERCUTANEOUS CORONARY STENT INTERVENTION (PCI-S);  Surgeon: Peter M Martinique, MD;  Location: [facility] C[facility] LAB;  Service: Cardiovascular;  Laterality: Right;  . SHOULDER ARTHROSCOPY WITH SUBACROMIAL DECOMPRESSION Left 03/05/2016   Procedure: SHOULDER ARTHROSCOPY ROTATOR CUFF REPAIR WITH SUBACROMIAL DECOMPRESSION;  Surgeon: Tania Ade, MD;  Location: [place];  Service: Orthopedics;  Laterality: Left;  SHOULDER ARTHROSCOPY ROTATOR CUFF REPAIR WITH SUBACROMIAL DECOMPRESSION  . SHOULDER CLOSED REDUCTION Left 11/29/2015   Procedure: CLOSED REDUCTION SHOULDER;  Surgeon: Tania Ade, MD;  Location: [place];  Service: Orthopedics;  Laterality: Left;  . TRANSURETHRAL RESECTION OF PROSTATE [name] 02/24/2018   Procedure: TRANSURETHRAL RESECTION OF THE PROSTATE (TURP);  Surgeon: Irine Seal, MD;  Location: [facility];  Service: Urology;  Laterality: [name];  . WRIST SURGERY Bilateral    plates and screws     Vitals:   11/23/19 1529  BP: (!) 87/60  Pulse: 91  SpO2: 96%     Subjective Assessment - 11/23/19 1527    Subjective Doing pretty good. Only having some occasional lightheadedness when he stands up from a chair which goes away in 5-6 seconds.    Pertinent History DVT LLE 20 years ago, unstable angina (hospitalized 2 weeks  ago), 2 MI's, scoliosis    Patient Stated Goals Pt would like to get better at getting up off of the floor and be steadier when he is walking.    Currently in Pain? No/denies                   Vestibular Assessment - 11/23/19 0001      Positional Testing   Dix-Hallpike Dix-Hallpike Right;Dix-Hallpike Left    Sidelying Test Sidelying Right;Sidelying Left    Horizontal Canal Testing Horizontal Canal Right;Horizontal Canal Left      Dix-Hallpike Right   Dix-Hallpike Right Duration 0    Dix-Hallpike Right Symptoms No nystagmus      Dix-Hallpike Left   Dix-Hallpike Left Duration 0    Dix-Hallpike Left Symptoms No nystagmus      Sidelying Right   Sidelying Right Duration 0    Sidelying Right Symptoms No nystagmus   indiscernable nystagmus upon sitting for ~8 sec     Sidelying Left   Sidelying Left Duration 0    Sidelying Left Symptoms No nystagmus   indiscernable nystagmus upon sitting for ~8 sec                   [facility] Adult PT Treatment/Exercise - 11/23/19 0001      Knee/Hip Exercises: Aerobic   Nustep L3 x 6 min (UEs/LEs)      Shoulder Exercises: Supine   External Rotation AAROM;Left;10 reps    External Rotation Limitations with wand to tolerance    Flexion AAROM;Left;5 reps    Flexion Limitations with wand & hands together   very limited ROM, limited by pain   ABduction AAROM;Right;5 reps    ABduction Limitations 2x5; limited ROM                   PT Education - 11/23/19 1608    Education Details update to HEP- advised patient to perform with wife present for safety    Person(s) Educated Patient    Methods Explanation;Demonstration;Tactile cues;Verbal cues;Handout    Comprehension Verbalized understanding;Returned demonstration            PT Short Term Goals - 11/02/19 1727      PT SHORT TERM GOAL #1   Title Pt will be I and compliant with initial HEP.    Time 1    Period Weeks    Status Achieved    Target Date 09/04/19      PT  SHORT TERM GOAL #2   Title Pt will decrease 5XSST time to 15 seconds or less.    Baseline 17s    Time 3    Period Weeks    Status On-going   10/09/19: 5xSTS trial 1 23.92 sec, trial 2 25.46 sec without UE support   Target Date 11/23/19      PT SHORT TERM GOAL #3   Title Pt will increase BERG score to at least 46/54.    Baseline 41/54    Time  3    Period Weeks    Status On-going   10/09/19: 44/46   Target Date 11/23/19             PT Long Term Goals - 11/21/19 1550      PT LONG TERM GOAL #1   Title Pt will decrease 5XSST time to 12.6s or less, in order to decrease fall risk to age bracket norm.    Baseline 17s    Time 6    Period Weeks    Status On-going   10/09/19: 5xSTS trial 1 23.92 sec, trial 2 25.46 sec without UE support     PT LONG TERM GOAL #2   Title Pt will improve BERG score to >48/54, indicating significant improvement and decrease in fall risk.    Baseline 41/54    Time 6    Period Weeks    Status On-going   11/21/19: 45/56     PT LONG TERM GOAL #3   Title Pt will perform TUG in 13.5 seconds or less to indicate decreased risk of falls.    Baseline to be completed at 2nd visit    Time 6    Period Weeks    Status Achieved   10/09/19: TUG 18.76 sec with SPC, 17.58 sec without AD     PT LONG TERM GOAL #4   Title Pt will perform 10 squats with good form and posture with no UE support.    Time 6    Period Weeks    Status Partially Met   requiring minor cues to shift weight posteriorly     PT LONG TERM GOAL #5   Title Patient to report 75% improvement in confidence to transfer from floor with/without UE use.    Time 6    Period Weeks    Status On-going      PT LONG TERM GOAL #6   Title Patient to report no dizziness with positional testing.    Time 6    Period Weeks    Status On-going      PT LONG TERM GOAL #7   Title Patient to report ability to use L UE back to baseline function.    Time 6    Period Weeks    Status On-going                  Plan - 11/23/19 1609    Clinical Impression Statement Patient reporting occasional lightheadedness with STS transfers which dissipates in 5-6 seconds. BP still on the low end at start of session, but [facility]. Worked on supine shoulder AAROM exercises to assess for both shoulder ROM and supine positioning tolerance. Patient demonstrated 5 seconds of indiscernible  nystagmus in supine. Shoulder flexion ROM limited by pain, but with better tolerance for abduction and ER. R and L Dix Hallpike were negative. Assessed R and L sidelying test, which were negative for nystagmus and dizziness, however patient with dizziness and indiscernible nystagmus upon sitting to B sides. R and L roll test were negative for nystagmus and dizziness. D/t inconsistency with positional testing today, educated patient on Nestor Lewandowsky for hopeful habituation. Plan to further assess canals and positional tolerance next session.    Comorbidities Arthritis, CHF, HTN, DVT, Dyslipidemia, Unstable Angina, CAD    Rehab Potential Good    PT Frequency 2x / week    PT Duration 6 weeks    PT Treatment/Interventions ADLs/Self Care Home Management;Moist Heat;Iontophoresis 4mg /ml Dexamethasone;Electrical Stimulation;Cryotherapy;Vasopneumatic Device;Passive range of motion;Dry needling;Energy conservation;Taping;Compression bandaging;Manual  techniques;Patient/family education;Therapeutic exercise;Gait training;Stair training;Functional mobility training;Therapeutic activities;Balance training;Neuromuscular re-education;Vestibular;DME Instruction    PT Next Visit Plan reassess dizziness, progress UE/LE strengthening and static/dynamic balance    Consulted and Agree with Plan of Care Patient           Patient will benefit from skilled therapeutic intervention in order to improve the following deficits and impairments:  Decreased activity tolerance, Decreased balance, Decreased endurance, Abnormal gait, Decreased mobility, Impaired  flexibility, Decreased strength, Impaired UE functional use, Postural dysfunction, Pain, Difficulty walking, Cardiopulmonary status limiting activity, Impaired perceived functional ability, Improper body mechanics, Impaired tone, Decreased safety awareness, Increased fascial restricitons, Increased muscle spasms, Decreased range of motion, Dizziness  Visit Diagnosis: Unsteadiness on feet  Dizziness and giddiness  Acute pain of left shoulder  Muscle weakness (generalized)  Difficulty in walking, not elsewhere classified  Abnormal posture     Problem List Patient Active Problem List   Diagnosis Date Noted  . SDH (subdural hematoma) ([place]) 10/12/2019  . Hypokalemia   . OSA on CPAP   . Chest pain 08/11/2019  . Precordial chest pain   . Unstable angina ([place]) 07/26/2018  . BPH with urinary obstruction 02/24/2018  . Renal hemorrhage, right 10/26/2017  . S/P left rotator cuff repair 03/05/2016  . Anterior dislocation of left shoulder 11/29/2015  . History of implantable cardioverter-defibrillator (ICD) placement 03/16/2015  . ICD (implantable cardioverter-defibrillator) in place   . Chronic systolic heart failure ([place]) 12/31/2014  . Chronic systolic dysfunction of left ventricle 11/27/2014  . S/P drug eluting coronary stent placement, mLAD 07/06/14, Promus 07/07/2014  . At risk for sudden cardiac death, EF 25-30% with PVCs, with lifevest 07/07/2014  . Coronary artery disease involving native coronary artery of native heart with unstable angina pectoris ([place])   . Cardiomyopathy, ischemic   . History of DVT (deep vein thrombosis), on coumadin   . NSTEMI (non-ST elevated myocardial infarction) ([place]) 07/05/2014  . CAD (coronary artery disease)   . Recurrent acute deep vein thrombosis (DVT) of lower extremity ([place])   . Dyslipidemia   . GERD (gastroesophageal reflux disease)   . Anterior myocardial infarction ([place])   . Lupus anticoagulant positive       Janene Harvey, [place],  DPT 11/23/19 4:15 PM   [facility] [address]  [place], [place], [address] Phone: [phone]   Fax:  [phone]  Name: JAVAN GONZAGA MRN: [id number] Date of Birth: Oct 17, 1945

## 2019-11-24 DIAGNOSIS — I10 Essential (primary) hypertension: Secondary | ICD-10-CM | POA: Diagnosis not present

## 2019-11-24 DIAGNOSIS — Z7901 Long term (current) use of anticoagulants: Secondary | ICD-10-CM | POA: Diagnosis not present

## 2019-11-24 DIAGNOSIS — I951 Orthostatic hypotension: Secondary | ICD-10-CM | POA: Diagnosis not present

## 2019-11-24 DIAGNOSIS — E7800 Pure hypercholesterolemia, unspecified: Secondary | ICD-10-CM | POA: Diagnosis not present

## 2019-11-28 ENCOUNTER — Ambulatory Visit (INDEPENDENT_AMBULATORY_CARE_PROVIDER_SITE_OTHER): Payer: PPO

## 2019-11-28 ENCOUNTER — Other Ambulatory Visit: Payer: Self-pay

## 2019-11-28 ENCOUNTER — Encounter: Payer: Self-pay | Admitting: Physical Therapy

## 2019-11-28 ENCOUNTER — Ambulatory Visit: Payer: PPO | Admitting: Physical Therapy

## 2019-11-28 VITALS — BP 80/58 | HR 99

## 2019-11-28 DIAGNOSIS — Z95810 Presence of automatic (implantable) cardiac defibrillator: Secondary | ICD-10-CM

## 2019-11-28 DIAGNOSIS — I5022 Chronic systolic (congestive) heart failure: Secondary | ICD-10-CM

## 2019-11-28 DIAGNOSIS — R2681 Unsteadiness on feet: Secondary | ICD-10-CM | POA: Diagnosis not present

## 2019-11-28 DIAGNOSIS — M25512 Pain in left shoulder: Secondary | ICD-10-CM

## 2019-11-28 DIAGNOSIS — M6281 Muscle weakness (generalized): Secondary | ICD-10-CM

## 2019-11-28 DIAGNOSIS — R262 Difficulty in walking, not elsewhere classified: Secondary | ICD-10-CM

## 2019-11-28 DIAGNOSIS — R42 Dizziness and giddiness: Secondary | ICD-10-CM

## 2019-11-28 DIAGNOSIS — R293 Abnormal posture: Secondary | ICD-10-CM

## 2019-11-28 NOTE — Progress Notes (Signed)
Cardiology Office Note   Date:  11/29/2019   ID:  Kevin Hart, Kevin Hart October 17, 1945, MRN [id number]  PCP:  [facility]  Cardiologist:  Altheria Kevin Martinique, MD EP: Kevin Grayer, MD  Chief Complaint  Patient presents with   Congestive Heart Failure      History of Present Illness: Kevin Hart is a 74 y.o. 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   He was  admitted to the hospital from 08/11/19-08/15/19 after presenting with nitro responsive chest pain c/f unstable angina. He underwent a NST which showed a high risk study with finding c/w prior MI but no ischemia. He underwent a LHC 08/14/19 which showed 30% pRCA stenosis and 30-35% ISR of previously placed p-mLAD stent. He was recommended for ongoing medical management. He was continued on aspirin, statin, BBlocker (inderal due to tremors), and entresto. He was started on zetia for improved cholesterol management. Warfarin resumed.  He was admitted in July after a fall and suffered a subdural hematoma. He had actually been off coumadin for a subdural steroid injection when he fell. Coumadin and ASA held for 10 days then resumed. He notes low BP at times - down to 80/55. Feels lightheaded and dizzy with this and lethargic. Notes problems with strength and balance. Currently receiving PT. No swelling. Weight is down an additional 10 lbs. No dyspnea or chest pain.    Past Medical History:  Diagnosis Date   Arthritis    Bladder stone 10/26/2017   BPH (benign prostatic hyperplasia)    takes Flomax daily   CAD (coronary artery disease)    a. prior LAD stenting;  b. 06/2014 NSTEMI/PCI: LM nl, LAD 30p, 22m ISR (3.0x20 Promus DES), LCX nl, RCA nondom, nl, EF 25-30% ant, apical, dist inf [place].   Cataracts, bilateral    immature   Chronic systolic CHF (congestive heart failure) ([place])  12/31/2014   DVT (deep venous thrombosis) (H[facility])    RECURRENT left leg   Dyslipidemia    GERD (gastroesophageal reflux disease)    Gout    History of kidney stones    Hypertension    Ischemic cardiomyopathy 07/06/2014   Lupus anticoagulant positive    Myocardial infarct ([place]) 2000   Myocardial infarct ([place]) 2016   S/P drug eluting coronary stent placement, 07/06/14, Promus 07/07/2014   Sleep apnea    biPaP   Subarachnoid bleed ([place]) 2015   after falling and hitting his head    Past Surgical History:  Procedure Laterality Date   COLONOSCOPY     EP IMPLANTABLE DEVICE [name] 11/27/2014   St. Jude Medical Fortify Assura VR  implanted by Dr Kevin Hart for primary prevention   LEFT HEART CATH AND CORONARY ANGIOGRAPHY [name] 07/29/2018   Procedure: LEFT HEART CATH AND CORONARY ANGIOGRAPHY;  Surgeon: Kevin Mocha, MD;  Location: [place] [facility];  Service: Cardiovascular;  Laterality: [name];   LEFT HEART CATH AND CORONARY ANGIOGRAPHY [name] 08/14/2019   Procedure: LEFT HEART CATH AND CORONARY ANGIOGRAPHY;  Surgeon: Kevin Sine, MD;  Location: [place] [facility];  Service: Cardiovascular;  Laterality: [name];   LEFT HEART CATHETERIZATION WITH CORONARY ANGIOGRAM [name] 07/06/2014   Procedure: LEFT HEART CATHETERIZATION WITH CORONARY ANGIOGRAM;  Surgeon: Kevin Hart M Martinique, MD;  Location: [facility] CATH LAB;  Service: Cardiovascular;  Laterality: [name];   PERCUTANEOUS CORONARY STENT INTERVENTION (PCI-S)  Right 07/06/2014   Procedure: PERCUTANEOUS CORONARY STENT INTERVENTION (PCI-S);  Surgeon: Kevin Hart M Martinique, MD;  Location: [facility] CATH LAB;  Service: Cardiovascular;  Laterality: Right;   SHOULDER ARTHROSCOPY WITH SUBACROMIAL DECOMPRESSION Left 03/05/2016   Procedure: SHOULDER ARTHROSCOPY ROTATOR CUFF REPAIR WITH SUBACROMIAL DECOMPRESSION;  Surgeon: Kevin Ade, MD;  Location: [facility];  Service: Orthopedics;  Laterality: Left;  SHOULDER ARTHROSCOPY ROTATOR CUFF REPAIR WITH SUBACROMIAL DECOMPRESSION   SHOULDER  CLOSED REDUCTION Left 11/29/2015   Procedure: CLOSED REDUCTION SHOULDER;  Surgeon: Kevin Ade, MD;  Location: [place];  Service: Orthopedics;  Laterality: Left;   TRANSURETHRAL RESECTION OF PROSTATE [name] 02/24/2018   Procedure: TRANSURETHRAL RESECTION OF THE PROSTATE (TURP);  Surgeon: Kevin Seal, MD;  Location: [facility];  Service: Urology;  Laterality: [name];   WRIST SURGERY Bilateral    plates and screws      Current Outpatient Medications  Medication Sig Dispense Refill   allopurinol (ZYLOPRIM) 300 MG tablet Take 300 mg by mouth daily.      aspirin 81 MG chewable tablet Chew 1 tablet (81 mg total) by mouth daily. 30 tablet 11   ezetimibe (ZETIA) 10 MG tablet Take 1 tablet (10 mg total) by mouth daily. 30 tablet 11   levETIRAcetam (KEPPRA) 500 MG tablet Take 1 tablet (500 mg total) by mouth 2 (two) times daily. 12 tablet 0   LIVALO 4 MG TABS Take 4 mg by mouth at bedtime.      Multiple Vitamin (MULTI-VITAMINS) TABS Take 1 tablet by mouth daily.      Multiple Vitamin (ONE-A-DAY MENS PO) Take 1 tablet by mouth daily.     nitroGLYCERIN (NITROSTAT) 0.4 MG SL tablet Place 1 tablet (0.4 mg total) under the tongue every 5 (five) minutes x 3 doses as needed for chest pain. 25 tablet 11   pantoprazole (PROTONIX) 40 MG tablet Take 40 mg by mouth daily.     potassium chloride SA (KLOR-CON) 20 MEQ tablet Take 20 mEq by mouth daily.     propranolol (INDERAL) 20 MG tablet Take 20 mg by mouth 2 (two) times daily.     sacubitril-valsartan (ENTRESTO) 24-26 MG Take 1 tablet by mouth daily. 60 tablet 3   warfarin (COUMADIN) 5 MG tablet Take 0.5-1 tablets (2.5-5 mg total) by mouth See admin instructions. Take 5mg  on 5/25 and 5/26 then resume home regimen of 2.5mg  daily except 5mg  on Wednesday     furosemide (LASIX) 20 MG tablet Take 1 tablet (20 mg total) by mouth daily. 90 tablet 3   No current facility-administered medications for this visit.    Allergies:   Patient has no known allergies.     Social History:  The patient  reports that he has never smoked. He has never used smokeless tobacco. He reports current alcohol use. He reports that he does not use drugs.   Family History:  The patient's family history includes Aneurysm in his father.    ROS:  Please see the history of present illness.   Otherwise, review of systems are positive for none.   All other systems are reviewed and negative.    PHYSICAL EXAM: VS:  BP 98/60    Pulse 72    Ht 5\' 11"  (1.803 m)    Wt 209 lb 12.8 oz (95.2 kg)    BMI 29.26 kg/m  , BMI Body mass index is 29.26 kg/m. GEN: Well nourished, well developed, in no acute distress HEENT: sclera anicteric Neck: no JVD, carotid bruits, or masses Cardiac: RRR; no  murmurs, rubs, or gallops, no edema  Respiratory:  clear to auscultation bilaterally, normal work of breathing GI: soft, nontender, nondistended, + BS MS: no deformity or atrophy Skin: warm and dry, no rash Neuro:  Strength and sensation are intact Psych: euthymic mood, full affect   EKG:  EKG is not ordered today.    Recent Labs: 08/11/2019: Magnesium 2.0; TSH 1.081 10/13/2019: ALT 14; BUN 17; Creatinine, Ser 1.11; Hemoglobin 13.1; Platelets 177; Potassium 2.9; Sodium 144    Lipid Panel    Component Value Date/Time   CHOL 169 08/12/2019 0423   CHOL 133 09/14/2018 0837   TRIG 118 08/12/2019 0423   HDL 39 (L) 08/12/2019 0423   HDL 49 09/14/2018 0837   CHOLHDL 4.3 08/12/2019 0423   VLDL 24 08/12/2019 0423   LDLCALC 106 (H) 08/12/2019 0423   LDLCALC 59 09/14/2018 0837    Labs reviewed from 11/09/19: INR 1.7. UA normal. Cholesterol 146, triglycerides 146, HDL 47, LDL 70. Ferritin normal. Covid test negative. CBC normal. Glucose 114. Otherwise CMET normal. Vit D 28.38. FSH, folate, TSH LH testosterone all normal.   Wt Readings from Last 3 Encounters:  11/29/19 209 lb 12.8 oz (95.2 kg)  10/25/19 215 lb (97.5 kg)  10/13/19 (!) 237 lb 14 oz (107.9 kg)      Other studies  Reviewed: Additional studies/ records that were reviewed today include:   LEFT HEART CATH AND CORONARY ANGIOGRAPHY 08/14/19  Conclusion    Prox RCA lesion is 30% stenosed.  Prox LAD to Mid LAD lesion is 35% stenosed.  The Left main is a normal vessel and trifurcates into a large LAD, ramus immediate vessel and dominant left circumflex coronary artery. The LAD stent remains patent and has residual in-stent narrowing of 30 to 35% not significantly changed from the prior catheterization. The ramus intermediate and dominant circumflex are angiographically normal. The RCA is a small nondominant vessel with smooth 30% proximal stenosis.  LVEDP 5 mmHg.  RECOMMENDATION: Medical therapy. Reinitiate warfarin therapy. GDMT therapy for his reduced LV function.   Diagnostic Dominance: Left  Intervention    Echo 08/12/19 1. Since the last study on 08/18/2017 LVEf has further decreased from  35-40% to 25-30% with diffuse hypokinesis and apical aneurysmal  dilatation.  2. Left ventricular ejection fraction, by estimation, is 25 to 30%. The  left ventricle has severely decreased function. The left ventricle has no  regional wall motion abnormalities. The left ventricular internal cavity  size was mildly dilated. There is  moderate concentric left ventricular hypertrophy. Left ventricular  diastolic parameters are consistent with Grade I diastolic dysfunction  (impaired relaxation). Elevated left atrial pressure.  3. Right ventricular systolic function is normal. The right ventricular  size is normal. A ICD wire is visualized. There is normal pulmonary artery  systolic pressure. The estimated right ventricular systolic pressure is  51.0 mmHg.  4. The mitral valve is normal in structure. Mild mitral valve  regurgitation. No evidence of mitral stenosis.  5. The aortic valve is normal in structure. Aortic valve regurgitation is  mild. Mild to moderate aortic valve  sclerosis/calcification is present,  without any evidence of aortic stenosis.  6. The inferior vena cava is normal in size with greater than 50%  respiratory variability, suggesting right atrial pressure of 3 mmHg.       ASSESSMENT AND PLAN:  1. CAD s/p remote PCI to LAD: no anginal complaints - Cardiac cath in May 2021 showed nonobstructive disease - Continue aspirin, statin, and zetia -  Continue low dose BBlocker  2. Chronic combined CHF: EF 25-30% in May, down from 35-40% previously. No volume overload - having periods of symptomatic hypotension - Continue low dose inderal and lasix - will reduce Entresto to once a day. If Hypotension persists will need to discontinue given high fall risk - s/p ICD  3. HTN: now with low BP readings.   4. HLD:  - Continue zetia and livalo  5. Fall with SDH.       Disposition:   FU with Dr. Martinique in 3 months  Signed, Antionetta Ator Martinique, MD  11/29/2019 9:25 AM

## 2019-11-28 NOTE — Therapy (Addendum)
[facility] [address]  [address] [place], [place], [address] Phone: [phone]   Fax:  [phone]  Physical Therapy Treatment  Patient Details  Name: Kevin Hart MRN: [id number] Date of Birth: 09-24-1945 Referring Provider (PT): Manson Passey, [place]  Progress Note Reporting Period 11/07/19 to 11/28/19  See note below for Objective Data and Assessment of Progress/Goals.     Encounter Date: 11/28/2019   PT End of Session - 11/28/19 1718    Visit Number 14    Number of Visits 22    Date for PT Re-Evaluation 12/14/19    Authorization Type [employer]    PT Start Time 1442    PT Stop Time 1530    PT Time Calculation (min) 48 min    Activity Tolerance Patient tolerated treatment well    Behavior During Therapy WFL for tasks assessed/performed           Past Medical History:  Diagnosis Date  . Arthritis   . Bladder stone 10/26/2017  . BPH (benign prostatic hyperplasia)    takes Flomax daily  . CAD (coronary artery disease)    a. prior LAD stenting;  b. 06/2014 NSTEMI/PCI: LM nl, LAD 30p, 55m ISR (3.0x20 Promus DES), LCX nl, RCA nondom, nl, EF 25-30% ant, apical, dist inf AK.  . Cataracts, bilateral    immature  . Chronic systolic CHF (congestive heart failure) (HCC) 12/31/2014  . DVT (deep venous thrombosis) (HCC)    RECURRENT left leg  . Dyslipidemia   . GERD (gastroesophageal reflux disease)   . Gout   . History of kidney stones   . Hypertension   . Ischemic cardiomyopathy 07/06/2014  . Lupus anticoagulant positive   . Myocardial infarct (HCC) 2000  . Myocardial infarct (HCC) 2016  . S/P drug eluting coronary stent placement, 07/06/14, Promus 07/07/2014  . Sleep apnea    biPaP  . Subarachnoid bleed (HCC) 2015   after falling and hitting his head    Past Surgical History:  Procedure Laterality Date  . COLONOSCOPY    . EP IMPLANTABLE DEVICE [name] 11/27/2014   [employer] Fortify Assura  VR  implanted by Dr Johney Frame for primary prevention  . LEFT HEART CATH AND CORONARY ANGIOGRAPHY [name] 07/29/2018   Procedure: LEFT HEART CATH AND CORONARY ANGIOGRAPHY;  Surgeon: Tonny Bollman, MD;  Location: [facility] INVASIVE CV LAB;  Service: Cardiovascular;  Laterality: [name];  . LEFT HEART CATH AND CORONARY ANGIOGRAPHY [name] 08/14/2019   Procedure: LEFT HEART CATH AND CORONARY ANGIOGRAPHY;  Surgeon: Lennette Bihari, MD;  Location: [facility] INVASIVE CV LAB;  Service: Cardiovascular;  Laterality: [name];  . LEFT HEART CATHETERIZATION WITH CORONARY ANGIOGRAM [name] 07/06/2014   Procedure: LEFT HEART CATHETERIZATION WITH CORONARY ANGIOGRAM;  Surgeon: Peter M Swaziland, MD;  Location: [facility] CATH LAB;  Service: Cardiovascular;  Laterality: [name];  . PERCUTANEOUS CORONARY STENT INTERVENTION (PCI-S) Right 07/06/2014   Procedure: PERCUTANEOUS CORONARY STENT INTERVENTION (PCI-S);  Surgeon: Peter M Swaziland, MD;  Location: [facility] CATH LAB;  Service: Cardiovascular;  Laterality: Right;  . SHOULDER ARTHROSCOPY WITH SUBACROMIAL DECOMPRESSION Left 03/05/2016   Procedure: SHOULDER ARTHROSCOPY ROTATOR CUFF REPAIR WITH SUBACROMIAL DECOMPRESSION;  Surgeon: Jones Broom, MD;  Location: [facility] OR;  Service: Orthopedics;  Laterality: Left;  SHOULDER ARTHROSCOPY ROTATOR CUFF REPAIR WITH SUBACROMIAL DECOMPRESSION  . SHOULDER CLOSED REDUCTION Left 11/29/2015   Procedure: CLOSED REDUCTION SHOULDER;  Surgeon: Jones Broom, MD;  Location: [facility] OR;  Service: Orthopedics;  Laterality: Left;  . TRANSURETHRAL RESECTION  OF PROSTATE [name] 02/24/2018   Procedure: TRANSURETHRAL RESECTION OF THE PROSTATE (TURP);  Surgeon: Irine Seal, MD;  Location: [facility] ORS;  Service: Urology;  Laterality: [name];  . WRIST SURGERY Bilateral    plates and screws     Vitals:   11/28/19 1454  BP: (!) 80/58  Pulse: 99  SpO2: 96%     Subjective Assessment - 11/28/19 1441    Subjective Not much is new. will be seeing Cardiology tomorrow. Dizziness and lightheadedness is getting up out of bed is  getting better. Requesting to address LE strength and dizziness today.    Pertinent History DVT LLE 20 years ago, unstable angina (hospitalized 2 weeks ago), 2 MI's, scoliosis    Patient Stated Goals Pt would like to get better at getting up off of the floor and be steadier when he is walking.    Currently in Pain? No/denies                   Vestibular Assessment - 11/28/19 0001      Dix-Hallpike Left   Dix-Hallpike Left Duration 20    Dix-Hallpike Left Symptoms Upbeat, left rotatory nystagmus      Sidelying Right   Sidelying Right Duration 0    Sidelying Right Symptoms No nystagmus                    [facility] Adult PT Treatment/Exercise - 11/28/19 0001      Knee/Hip Exercises: Aerobic   Nustep L4 x 6 min (UEs/LEs)      Knee/Hip Exercises: Machines for Strengthening   Cybex Knee Extension B LEs 2x5 10#    Cybex Knee Flexion B LEs 2x5 20#      Knee/Hip Exercises: Seated   Sit to Sand 2 sets;without UE support;1 set;3 sets   1st set green loop, 2nd set 2x5 blue medball at chest          Vestibular Treatment/Exercise - 11/28/19 0001      Vestibular Treatment/Exercise   Canalith Repositioning Epley Manuever Left       EPLEY MANUEVER LEFT   Number of Reps  1    Overall Response  No change     RESPONSE DETAILS LEFT c/o dizziness and 10 sec of indiscernable nystagmus upon sitting                 PT Education - 11/28/19 1529    Education Details edu on post-epley instructions; update to HEP    Person(s) Educated Patient    Methods Explanation;Demonstration;Handout    Comprehension Verbalized understanding            PT Short Term Goals - 11/02/19 1727      PT SHORT TERM GOAL #1   Title Pt will be I and compliant with initial HEP.    Time 1    Period Weeks    Status Achieved    Target Date 09/04/19      PT SHORT TERM GOAL #2   Title Pt will decrease 5XSST time to 15 seconds or less.    Baseline 17s    Time 3    Period Weeks    Status  On-going   10/09/19: 5xSTS trial 1 23.92 sec, trial 2 25.46 sec without UE support   Target Date 11/23/19      PT SHORT TERM GOAL #3   Title Pt will increase BERG score to at least 46/54.    Baseline 41/54    Time 3  Period Weeks    Status On-going   10/09/19: 44/46   Target Date 11/23/19             PT Long Term Goals - 11/21/19 1550      PT LONG TERM GOAL #1   Title Pt will decrease 5XSST time to 12.6s or less, in order to decrease fall risk to age bracket norm.    Baseline 17s    Time 6    Period Weeks    Status On-going   10/09/19: 5xSTS trial 1 23.92 sec, trial 2 25.46 sec without UE support     PT LONG TERM GOAL #2   Title Pt will improve BERG score to >48/54, indicating significant improvement and decrease in fall risk.    Baseline 41/54    Time 6    Period Weeks    Status On-going   11/21/19: 45/56     PT LONG TERM GOAL #3   Title Pt will perform TUG in 13.5 seconds or less to indicate decreased risk of falls.    Baseline to be completed at 2nd visit    Time 6    Period Weeks    Status Achieved   10/09/19: TUG 18.76 sec with SPC, 17.58 sec without AD     PT LONG TERM GOAL #4   Title Pt will perform 10 squats with good form and posture with no UE support.    Time 6    Period Weeks    Status Partially Met   requiring minor cues to shift weight posteriorly     PT LONG TERM GOAL #5   Title Patient to report 75% improvement in confidence to transfer from floor with/without UE use.    Time 6    Period Weeks    Status On-going      PT LONG TERM GOAL #6   Title Patient to report no dizziness with positional testing.    Time 6    Period Weeks    Status On-going      PT LONG TERM GOAL #7   Title Patient to report ability to use L UE back to baseline function.    Time 6    Period Weeks    Status On-going                 Plan - 11/28/19 1719    Clinical Impression Statement Patient without new complaints today. Noting that dizziness and  lightheadedness when getting up out of bed is improving. Requesting to address LE strength and dizziness today. Worked on STS transfers with added challenges with patient requiring cues to control eccentric lowering. Patient requesting BP reading despite being asymptomatic- BP at rest low but suitable for treatment.  Patient performed LE machine strengthening with weight set at lower resistance than previous sessions, as patient has demonstrated a decrease in strength since his recent falls. Patient with asymptomatic R Marye Round today, but now with L upbeating torsional nystagmus and dizziness with L Marye Round. Treated patient with L Epley, with patient demonstrating 10 seconds of indiscernible nystagmus and dizziness upon sitting. Patient was educated on post-epley instruction and was able to exit clinic without issues. Plan to continue assessing and treating patient for dizziness as needed.    Comorbidities Arthritis, CHF, HTN, DVT, Dyslipidemia, Unstable Angina, CAD    Rehab Potential Good    PT Frequency 2x / week    PT Duration 6 weeks    PT Treatment/Interventions ADLs/Self Care Home Management;Moist Heat;Iontophoresis  52m/ml Dexamethasone;Electrical Stimulation;Cryotherapy;Vasopneumatic Device;Passive range of motion;Dry needling;Energy conservation;Taping;Compression bandaging;Manual techniques;Patient/family education;Therapeutic exercise;Gait training;Stair training;Functional mobility training;Therapeutic activities;Balance training;Neuromuscular re-education;Vestibular;DME Instruction    PT Next Visit Plan reassess dizziness, progress UE/LE strengthening and static/dynamic balance    Consulted and Agree with Plan of Care Patient           Patient will benefit from skilled therapeutic intervention in order to improve the following deficits and impairments:  Decreased activity tolerance, Decreased balance, Decreased endurance, Abnormal gait, Decreased mobility, Impaired flexibility,  Decreased strength, Impaired UE functional use, Postural dysfunction, Pain, Difficulty walking, Cardiopulmonary status limiting activity, Impaired perceived functional ability, Improper body mechanics, Impaired tone, Decreased safety awareness, Increased fascial restricitons, Increased muscle spasms, Decreased range of motion, Dizziness  Visit Diagnosis: Unsteadiness on feet  Dizziness and giddiness  Acute pain of left shoulder  Muscle weakness (generalized)  Difficulty in walking, not elsewhere classified  Abnormal posture     Problem List Patient Active Problem List   Diagnosis Date Noted  . SDH (subdural hematoma) ([place] 10/12/2019  . Hypokalemia   . OSA on CPAP   . Chest pain 08/11/2019  . Precordial chest pain   . Unstable angina ([facility] 07/26/2018  . BPH with urinary obstruction 02/24/2018  . Renal hemorrhage, right 10/26/2017  . S/P left rotator cuff repair 03/05/2016  . Anterior dislocation of left shoulder 11/29/2015  . History of implantable cardioverter-defibrillator (ICD) placement 03/16/2015  . ICD (implantable cardioverter-defibrillator) in place   . Chronic systolic heart failure ([facility] 12/31/2014  . Chronic systolic dysfunction of left ventricle 11/27/2014  . S/P drug eluting coronary stent placement, mLAD 07/06/14, Promus 07/07/2014  . At risk for sudden cardiac death, EF 25-30% with PVCs, with lifevest 07/07/2014  . Coronary artery disease involving native coronary artery of native heart with unstable angina pectoris ([facility]   . Cardiomyopathy, ischemic   . History of DVT (deep vein thrombosis), on coumadin   . NSTEMI (non-ST elevated myocardial infarction) ([facility] 07/05/2014  . CAD (coronary artery disease)   . Recurrent acute deep vein thrombosis (DVT) of lower extremity ([facility]   . Dyslipidemia   . GERD (gastroesophageal reflux disease)   . Anterior myocardial infarction ([facility] [place]   . Lupus anticoagulant positive      YJanene Harvey [place] DPT 11/28/19 [place] [address] [place] [address]Phone: [phone]  Fax:  [phone] Name: Kevin SCHIFFMRN: [id number] of Birth: [birth year]  PHYSICAL THERAPY DISCHARGE SUMMARY  Visits from Start of Care: 14  Current functional level related to goals / functional outcomes: Unable to assess; patient had a fall and was hospitalized   Remaining deficits: Imbalance, dizziness, decreased L shoulder ROM, difficulty with transfers   Education / Equipment: HEP  Plan: Patient agrees to discharge.  Patient goals were partially met. Patient is being discharged due to a change in medical status.  ?????     YJanene Harvey PT, DPT 01/09/20 2:34 PM

## 2019-11-28 NOTE — Progress Notes (Signed)
EPIC Encounter for ICM Monitoring  Patient Name: Kevin Hart is a 74 y.o. male Date: 11/28/2019 Primary Care Physican: [facility] [place] Electrophysiologist: Allred 7/2/2021Office Weight:223lbs   Spoke with patient and reports feeling well at this time.  Denies fluid symptoms.     CorvueThoracic impedancesuggesting possible fluid accumulation starting 11/18/2019.  Prescribed:  Furosemide 40 mg 1 tablet daily.11/20/2019 Patient reports dosage decreased to 20 mg daily due to dizziness.  Potassium 20 mEq take 1 tablet daily  Labs 10/13/2019 Creatinine 1.11, BUN 17, Potassium 2.9, Sodium 144, GFR >60 10/12/2019 Creatinine 1.34, BUN 22, Potassium 3.6, Sodium 141, GFR 51-60  08/15/2019 Creatinine1.03, BUN15, Potassium4.2, Sodium140, GFR>60 08/14/2019 Creatinine1.04, BUN14, Potassium4.3, Sodium140, GFR>60  08/12/2019 Creatinine0.99, BUN14, Potassium3.3, Sodium143, GFR>60  08/11/2019 Creatinine1.08, BUN13, Potassium3.8, Sodium139, GFR>60 A complete set of results can be found in Results Review.  Recommendations: No changes and encouraged to call if experiencing any fluid symptoms.  Follow-up plan: ICM clinic phone appointment on 12/26/2019.   91 day device clinic remote transmission 12/25/2019.    EP/Cardiology Office Visits: 11/29/2019 with Dr. Martinique.    Copy of ICM check sent to Dr. Rayann Heman and Dr Martinique since patient has 9/8 OV.   3 month ICM trend: 11/28/2019    1 Year ICM trend:       Rosalene Billings, RN 11/28/2019 12:17 PM

## 2019-11-29 ENCOUNTER — Ambulatory Visit: Payer: PPO | Admitting: Cardiology

## 2019-11-29 ENCOUNTER — Encounter: Payer: Self-pay | Admitting: Cardiology

## 2019-11-29 VITALS — BP 98/60 | HR 72 | Ht 71.0 in | Wt 209.8 lb

## 2019-11-29 DIAGNOSIS — R296 Repeated falls: Secondary | ICD-10-CM | POA: Diagnosis not present

## 2019-11-29 DIAGNOSIS — I10 Essential (primary) hypertension: Secondary | ICD-10-CM | POA: Diagnosis not present

## 2019-11-29 DIAGNOSIS — I5022 Chronic systolic (congestive) heart failure: Secondary | ICD-10-CM | POA: Diagnosis not present

## 2019-11-29 DIAGNOSIS — I2510 Atherosclerotic heart disease of native coronary artery without angina pectoris: Secondary | ICD-10-CM

## 2019-11-29 DIAGNOSIS — J9383 Other pneumothorax: Secondary | ICD-10-CM | POA: Diagnosis not present

## 2019-11-29 DIAGNOSIS — I255 Ischemic cardiomyopathy: Secondary | ICD-10-CM | POA: Diagnosis not present

## 2019-11-29 DIAGNOSIS — E785 Hyperlipidemia, unspecified: Secondary | ICD-10-CM | POA: Diagnosis not present

## 2019-11-29 DIAGNOSIS — W19XXXA Unspecified fall, initial encounter: Secondary | ICD-10-CM | POA: Diagnosis not present

## 2019-11-29 DIAGNOSIS — R0789 Other chest pain: Secondary | ICD-10-CM | POA: Diagnosis not present

## 2019-11-29 DIAGNOSIS — Z95810 Presence of automatic (implantable) cardiac defibrillator: Secondary | ICD-10-CM | POA: Diagnosis not present

## 2019-11-29 MED ORDER — ENTRESTO 24-26 MG PO TABS: 1.0000 | ORAL_TABLET | Freq: Every day | ORAL | 3 refills | Status: AC

## 2019-11-29 MED ORDER — FUROSEMIDE 20 MG PO TABS: 20.0000 mg | ORAL_TABLET | Freq: Every day | ORAL | 3 refills | Status: AC

## 2019-11-29 NOTE — Patient Instructions (Signed)
Reduce Entresto to once a day  Call if you continue to have low BP readings.

## 2019-11-30 ENCOUNTER — Ambulatory Visit: Payer: PPO | Admitting: Physical Therapy

## 2019-11-30 DIAGNOSIS — I2510 Atherosclerotic heart disease of native coronary artery without angina pectoris: Secondary | ICD-10-CM | POA: Diagnosis not present

## 2019-11-30 DIAGNOSIS — W19XXXA Unspecified fall, initial encounter: Secondary | ICD-10-CM | POA: Diagnosis not present

## 2019-11-30 DIAGNOSIS — I509 Heart failure, unspecified: Secondary | ICD-10-CM | POA: Diagnosis not present

## 2019-11-30 DIAGNOSIS — R0600 Dyspnea, unspecified: Secondary | ICD-10-CM | POA: Diagnosis not present

## 2019-12-03 ENCOUNTER — Emergency Department (HOSPITAL_BASED_OUTPATIENT_CLINIC_OR_DEPARTMENT_OTHER): Payer: PPO

## 2019-12-03 ENCOUNTER — Inpatient Hospital Stay (HOSPITAL_COMMUNITY): Payer: PPO

## 2019-12-03 ENCOUNTER — Other Ambulatory Visit: Payer: Self-pay

## 2019-12-03 ENCOUNTER — Encounter (HOSPITAL_BASED_OUTPATIENT_CLINIC_OR_DEPARTMENT_OTHER): Payer: Self-pay | Admitting: Emergency Medicine

## 2019-12-03 ENCOUNTER — Inpatient Hospital Stay (HOSPITAL_BASED_OUTPATIENT_CLINIC_OR_DEPARTMENT_OTHER)
Admission: EM | Admit: 2019-12-03 | Discharge: 2019-12-15 | DRG: 964 | Disposition: A | Discharge status: Hospice | Payer: PPO | Attending: General Surgery | Admitting: General Surgery

## 2019-12-03 DIAGNOSIS — Z7982 Long term (current) use of aspirin: Secondary | ICD-10-CM

## 2019-12-03 DIAGNOSIS — J982 Interstitial emphysema: Secondary | ICD-10-CM | POA: Diagnosis not present

## 2019-12-03 DIAGNOSIS — E785 Hyperlipidemia, unspecified: Secondary | ICD-10-CM | POA: Diagnosis not present

## 2019-12-03 DIAGNOSIS — I619 Nontraumatic intracerebral hemorrhage, unspecified: Secondary | ICD-10-CM | POA: Diagnosis not present

## 2019-12-03 DIAGNOSIS — I722 Aneurysm of renal artery: Secondary | ICD-10-CM | POA: Diagnosis not present

## 2019-12-03 DIAGNOSIS — R410 Disorientation, unspecified: Secondary | ICD-10-CM | POA: Diagnosis not present

## 2019-12-03 DIAGNOSIS — F05 Delirium due to known physiological condition: Secondary | ICD-10-CM | POA: Diagnosis present

## 2019-12-03 DIAGNOSIS — Z79899 Other long term (current) drug therapy: Secondary | ICD-10-CM

## 2019-12-03 DIAGNOSIS — Z4682 Encounter for fitting and adjustment of non-vascular catheter: Secondary | ICD-10-CM | POA: Diagnosis not present

## 2019-12-03 DIAGNOSIS — G8929 Other chronic pain: Secondary | ICD-10-CM | POA: Diagnosis not present

## 2019-12-03 DIAGNOSIS — S06360A Traumatic hemorrhage of cerebrum, unspecified, without loss of consciousness, initial encounter: Secondary | ICD-10-CM | POA: Diagnosis not present

## 2019-12-03 DIAGNOSIS — R296 Repeated falls: Secondary | ICD-10-CM | POA: Diagnosis not present

## 2019-12-03 DIAGNOSIS — S065XAA Traumatic subdural hemorrhage with loss of consciousness status unknown, initial encounter: Secondary | ICD-10-CM

## 2019-12-03 DIAGNOSIS — J9690 Respiratory failure, unspecified, unspecified whether with hypoxia or hypercapnia: Secondary | ICD-10-CM

## 2019-12-03 DIAGNOSIS — S066X0A Traumatic subarachnoid hemorrhage without loss of consciousness, initial encounter: Secondary | ICD-10-CM | POA: Diagnosis not present

## 2019-12-03 DIAGNOSIS — Z7901 Long term (current) use of anticoagulants: Secondary | ICD-10-CM

## 2019-12-03 DIAGNOSIS — W19XXXA Unspecified fall, initial encounter: Secondary | ICD-10-CM | POA: Diagnosis not present

## 2019-12-03 DIAGNOSIS — M109 Gout, unspecified: Secondary | ICD-10-CM | POA: Diagnosis present

## 2019-12-03 DIAGNOSIS — J939 Pneumothorax, unspecified: Secondary | ICD-10-CM

## 2019-12-03 DIAGNOSIS — D6862 Lupus anticoagulant syndrome: Secondary | ICD-10-CM | POA: Diagnosis not present

## 2019-12-03 DIAGNOSIS — R40241 Glasgow coma scale score 13-15, unspecified time: Secondary | ICD-10-CM | POA: Diagnosis present

## 2019-12-03 DIAGNOSIS — I10 Essential (primary) hypertension: Secondary | ICD-10-CM | POA: Diagnosis not present

## 2019-12-03 DIAGNOSIS — I5022 Chronic systolic (congestive) heart failure: Secondary | ICD-10-CM | POA: Diagnosis not present

## 2019-12-03 DIAGNOSIS — Z515 Encounter for palliative care: Secondary | ICD-10-CM | POA: Diagnosis not present

## 2019-12-03 DIAGNOSIS — E876 Hypokalemia: Secondary | ICD-10-CM | POA: Diagnosis present

## 2019-12-03 DIAGNOSIS — J930 Spontaneous tension pneumothorax: Secondary | ICD-10-CM | POA: Diagnosis present

## 2019-12-03 DIAGNOSIS — Z66 Do not resuscitate: Secondary | ICD-10-CM | POA: Diagnosis not present

## 2019-12-03 DIAGNOSIS — R338 Other retention of urine: Secondary | ICD-10-CM | POA: Diagnosis present

## 2019-12-03 DIAGNOSIS — I509 Heart failure, unspecified: Secondary | ICD-10-CM | POA: Diagnosis not present

## 2019-12-03 DIAGNOSIS — S065X9A Traumatic subdural hemorrhage with loss of consciousness of unspecified duration, initial encounter: Secondary | ICD-10-CM | POA: Diagnosis not present

## 2019-12-03 DIAGNOSIS — I714 Abdominal aortic aneurysm, without rupture: Secondary | ICD-10-CM | POA: Diagnosis present

## 2019-12-03 DIAGNOSIS — Z95810 Presence of automatic (implantable) cardiac defibrillator: Secondary | ICD-10-CM

## 2019-12-03 DIAGNOSIS — Z20822 Contact with and (suspected) exposure to covid-19: Secondary | ICD-10-CM | POA: Diagnosis present

## 2019-12-03 DIAGNOSIS — Z7401 Bed confinement status: Secondary | ICD-10-CM | POA: Diagnosis not present

## 2019-12-03 DIAGNOSIS — Z955 Presence of coronary angioplasty implant and graft: Secondary | ICD-10-CM | POA: Diagnosis not present

## 2019-12-03 DIAGNOSIS — G250 Essential tremor: Secondary | ICD-10-CM | POA: Diagnosis not present

## 2019-12-03 DIAGNOSIS — T797XXA Traumatic subcutaneous emphysema, initial encounter: Secondary | ICD-10-CM | POA: Diagnosis not present

## 2019-12-03 DIAGNOSIS — Z951 Presence of aortocoronary bypass graft: Secondary | ICD-10-CM | POA: Diagnosis not present

## 2019-12-03 DIAGNOSIS — I25119 Atherosclerotic heart disease of native coronary artery with unspecified angina pectoris: Secondary | ICD-10-CM | POA: Diagnosis present

## 2019-12-03 DIAGNOSIS — G20 Parkinson's disease: Secondary | ICD-10-CM | POA: Diagnosis not present

## 2019-12-03 DIAGNOSIS — R0602 Shortness of breath: Secondary | ICD-10-CM

## 2019-12-03 DIAGNOSIS — D62 Acute posthemorrhagic anemia: Secondary | ICD-10-CM | POA: Diagnosis not present

## 2019-12-03 DIAGNOSIS — I6200 Nontraumatic subdural hemorrhage, unspecified: Secondary | ICD-10-CM | POA: Diagnosis not present

## 2019-12-03 DIAGNOSIS — S065X0A Traumatic subdural hemorrhage without loss of consciousness, initial encounter: Principal | ICD-10-CM | POA: Diagnosis present

## 2019-12-03 DIAGNOSIS — I517 Cardiomegaly: Secondary | ICD-10-CM | POA: Diagnosis not present

## 2019-12-03 DIAGNOSIS — K219 Gastro-esophageal reflux disease without esophagitis: Secondary | ICD-10-CM | POA: Diagnosis present

## 2019-12-03 DIAGNOSIS — T8182XA Emphysema (subcutaneous) resulting from a procedure, initial encounter: Secondary | ICD-10-CM | POA: Diagnosis not present

## 2019-12-03 DIAGNOSIS — G4733 Obstructive sleep apnea (adult) (pediatric): Secondary | ICD-10-CM | POA: Diagnosis not present

## 2019-12-03 DIAGNOSIS — I361 Nonrheumatic tricuspid (valve) insufficiency: Secondary | ICD-10-CM | POA: Diagnosis not present

## 2019-12-03 DIAGNOSIS — J439 Emphysema, unspecified: Secondary | ICD-10-CM | POA: Diagnosis not present

## 2019-12-03 DIAGNOSIS — Z86718 Personal history of other venous thrombosis and embolism: Secondary | ICD-10-CM

## 2019-12-03 DIAGNOSIS — R5381 Other malaise: Secondary | ICD-10-CM | POA: Diagnosis not present

## 2019-12-03 DIAGNOSIS — J9811 Atelectasis: Secondary | ICD-10-CM | POA: Diagnosis not present

## 2019-12-03 DIAGNOSIS — M2550 Pain in unspecified joint: Secondary | ICD-10-CM | POA: Diagnosis not present

## 2019-12-03 DIAGNOSIS — S0003XA Contusion of scalp, initial encounter: Secondary | ICD-10-CM | POA: Diagnosis not present

## 2019-12-03 DIAGNOSIS — R0689 Other abnormalities of breathing: Secondary | ICD-10-CM | POA: Diagnosis not present

## 2019-12-03 DIAGNOSIS — I609 Nontraumatic subarachnoid hemorrhage, unspecified: Secondary | ICD-10-CM | POA: Diagnosis not present

## 2019-12-03 DIAGNOSIS — S270XXA Traumatic pneumothorax, initial encounter: Secondary | ICD-10-CM | POA: Diagnosis not present

## 2019-12-03 DIAGNOSIS — S2241XA Multiple fractures of ribs, right side, initial encounter for closed fracture: Secondary | ICD-10-CM | POA: Diagnosis present

## 2019-12-03 DIAGNOSIS — I252 Old myocardial infarction: Secondary | ICD-10-CM | POA: Diagnosis not present

## 2019-12-03 DIAGNOSIS — E042 Nontoxic multinodular goiter: Secondary | ICD-10-CM | POA: Diagnosis present

## 2019-12-03 DIAGNOSIS — I614 Nontraumatic intracerebral hemorrhage in cerebellum: Secondary | ICD-10-CM | POA: Diagnosis not present

## 2019-12-03 DIAGNOSIS — R600 Localized edema: Secondary | ICD-10-CM | POA: Diagnosis not present

## 2019-12-03 DIAGNOSIS — I2510 Atherosclerotic heart disease of native coronary artery without angina pectoris: Secondary | ICD-10-CM | POA: Diagnosis not present

## 2019-12-03 DIAGNOSIS — W1830XA Fall on same level, unspecified, initial encounter: Secondary | ICD-10-CM | POA: Diagnosis present

## 2019-12-03 DIAGNOSIS — I110 Hypertensive heart disease with heart failure: Secondary | ICD-10-CM | POA: Diagnosis present

## 2019-12-03 DIAGNOSIS — I959 Hypotension, unspecified: Secondary | ICD-10-CM | POA: Diagnosis present

## 2019-12-03 DIAGNOSIS — S2231XA Fracture of one rib, right side, initial encounter for closed fracture: Secondary | ICD-10-CM | POA: Diagnosis not present

## 2019-12-03 DIAGNOSIS — R931 Abnormal findings on diagnostic imaging of heart and coronary circulation: Secondary | ICD-10-CM | POA: Diagnosis not present

## 2019-12-03 DIAGNOSIS — N401 Enlarged prostate with lower urinary tract symptoms: Secondary | ICD-10-CM | POA: Diagnosis present

## 2019-12-03 DIAGNOSIS — R531 Weakness: Secondary | ICD-10-CM | POA: Diagnosis not present

## 2019-12-03 DIAGNOSIS — S3991XA Unspecified injury of abdomen, initial encounter: Secondary | ICD-10-CM | POA: Diagnosis not present

## 2019-12-03 DIAGNOSIS — R339 Retention of urine, unspecified: Secondary | ICD-10-CM | POA: Diagnosis not present

## 2019-12-03 DIAGNOSIS — I351 Nonrheumatic aortic (valve) insufficiency: Secondary | ICD-10-CM | POA: Diagnosis not present

## 2019-12-03 DIAGNOSIS — R000 Tachycardia, unspecified: Secondary | ICD-10-CM | POA: Diagnosis not present

## 2019-12-03 DIAGNOSIS — R4182 Altered mental status, unspecified: Secondary | ICD-10-CM | POA: Diagnosis not present

## 2019-12-03 LAB — URINALYSIS, ROUTINE W REFLEX MICROSCOPIC
Bilirubin Urine: NEGATIVE
Glucose, UA: NEGATIVE mg/dL
Hgb urine dipstick: NEGATIVE
Ketones, ur: NEGATIVE mg/dL
Leukocytes,Ua: NEGATIVE
Nitrite: NEGATIVE
Protein, ur: NEGATIVE mg/dL
Specific Gravity, Urine: 1.025 (ref 1.005–1.030)
pH: 5.5 (ref 5.0–8.0)

## 2019-12-03 LAB — COMPREHENSIVE METABOLIC PANEL
ALT: 12 U/L (ref 0–44)
AST: 18 U/L (ref 15–41)
Albumin: 3.4 g/dL — ABNORMAL LOW (ref 3.5–5.0)
Alkaline Phosphatase: 32 U/L — ABNORMAL LOW (ref 38–126)
Anion gap: 11 (ref 5–15)
BUN: 14 mg/dL (ref 8–23)
CO2: 26 mmol/L (ref 22–32)
Calcium: 9.7 mg/dL (ref 8.9–10.3)
Chloride: 104 mmol/L (ref 98–111)
Creatinine, Ser: 0.96 mg/dL (ref 0.61–1.24)
GFR calc Af Amer: >60 mL/min (ref >60)
GFR calc non Af Amer: >60 mL/min (ref >60)
Glucose, Bld: 121 mg/dL — ABNORMAL HIGH (ref 70–99)
Potassium: 3.6 mmol/L (ref 3.5–5.1)
Sodium: 141 mmol/L (ref 135–145)
Total Bilirubin: 1.5 mg/dL — ABNORMAL HIGH (ref 0.3–1.2)
Total Protein: 6.8 g/dL (ref 6.5–8.1)

## 2019-12-03 LAB — TROPONIN I (HIGH SENSITIVITY)
Troponin I (High Sensitivity): 14 ng/L (ref <18)
Troponin I (High Sensitivity): 17 ng/L (ref <18)

## 2019-12-03 LAB — CBC WITH DIFFERENTIAL/PLATELET
Abs Immature Granulocytes: 0.06 10*3/uL (ref 0.00–0.07)
Basophils Absolute: 0.1 10*3/uL (ref 0.0–0.1)
Basophils Relative: 1 %
Eosinophils Absolute: 0.3 10*3/uL (ref 0.0–0.5)
Eosinophils Relative: 3 %
HCT: 43.8 % (ref 39.0–52.0)
Hemoglobin: 14.6 g/dL (ref 13.0–17.0)
Immature Granulocytes: 1 %
Lymphocytes Relative: 20 %
Lymphs Abs: 1.7 10*3/uL (ref 0.7–4.0)
MCH: 30.7 pg (ref 26.0–34.0)
MCHC: 33.3 g/dL (ref 30.0–36.0)
MCV: 92 fL (ref 80.0–100.0)
Monocytes Absolute: 0.8 10*3/uL (ref 0.1–1.0)
Monocytes Relative: 9 %
Neutro Abs: 5.7 10*3/uL (ref 1.7–7.7)
Neutrophils Relative %: 66 %
Platelets: 213 10*3/uL (ref 150–400)
RBC: 4.76 MIL/uL (ref 4.22–5.81)
RDW: 14.2 % (ref 11.5–15.5)
WBC: 8.6 10*3/uL (ref 4.0–10.5)
nRBC: 0 % (ref 0.0–0.2)

## 2019-12-03 LAB — PROTIME-INR
INR: 3.5 — ABNORMAL HIGH (ref 0.8–1.2)
INR: 1.3 — ABNORMAL HIGH (ref 0.8–1.2)
Prothrombin Time: 34.2 seconds — ABNORMAL HIGH (ref 11.4–15.2)
Prothrombin Time: 16 seconds — ABNORMAL HIGH (ref 11.4–15.2)

## 2019-12-03 LAB — SARS CORONAVIRUS 2 BY RT PCR (HOSPITAL ORDER, PERFORMED IN ~~LOC~~ HOSPITAL LAB): SARS Coronavirus 2: NEGATIVE

## 2019-12-03 MED ORDER — CHLORHEXIDINE GLUCONATE CLOTH 2 % EX PADS
6.0000 | MEDICATED_PAD | Freq: Every day | CUTANEOUS | Status: DC
  Administered 2019-12-03 – 2019-12-09 (×6): 6 via TOPICAL

## 2019-12-03 MED ORDER — PROPRANOLOL HCL 20 MG PO TABS
20.0000 mg | ORAL_TABLET | Freq: Two times a day (BID) | ORAL | Status: DC
  Administered 2019-12-04 (×2): 20 mg via ORAL
  Filled 2019-12-03 (×4): qty 1

## 2019-12-03 MED ORDER — FENTANYL CITRATE (PF) 100 MCG/2ML IJ SOLN
INTRAMUSCULAR | Status: AC
  Administered 2019-12-03: 75 ug via INTRAVENOUS
  Filled 2019-12-03: qty 2

## 2019-12-03 MED ORDER — ACETAMINOPHEN 325 MG PO TABS
975.0000 mg | ORAL_TABLET | Freq: Three times a day (TID) | ORAL | Status: DC
  Administered 2019-12-04 – 2019-12-11 (×24): 975 mg via ORAL
  Filled 2019-12-03 (×24): qty 3

## 2019-12-03 MED ORDER — SODIUM CHLORIDE 0.9 % IV SOLN: INTRAVENOUS | Status: DC | PRN

## 2019-12-03 MED ORDER — PROTHROMBIN COMPLEX CONC HUMAN 500 UNITS IV KIT
PACK | INTRAVENOUS | Status: AC
  Filled 2019-12-03: qty 5000

## 2019-12-03 MED ORDER — ONDANSETRON 4 MG PO TBDP
4.0000 mg | ORAL_TABLET | Freq: Four times a day (QID) | ORAL | Status: DC | PRN
  Administered 2019-12-12: 4 mg via ORAL
  Filled 2019-12-03: qty 1

## 2019-12-03 MED ORDER — DOCUSATE SODIUM 100 MG PO CAPS
100.0000 mg | ORAL_CAPSULE | Freq: Two times a day (BID) | ORAL | Status: DC
  Administered 2019-12-04 – 2019-12-13 (×17): 100 mg via ORAL
  Filled 2019-12-03 (×18): qty 1

## 2019-12-03 MED ORDER — LEVETIRACETAM IN NACL 500 MG/100ML IV SOLN
500.0000 mg | Freq: Two times a day (BID) | INTRAVENOUS | Status: DC
  Administered 2019-12-03 – 2019-12-06 (×6): 500 mg via INTRAVENOUS
  Filled 2019-12-03 (×7): qty 100

## 2019-12-03 MED ORDER — METOPROLOL TARTRATE 5 MG/5ML IV SOLN
5.0000 mg | Freq: Four times a day (QID) | INTRAVENOUS | Status: DC | PRN
  Administered 2019-12-04: 5 mg via INTRAVENOUS
  Filled 2019-12-03: qty 5

## 2019-12-03 MED ORDER — ACETAMINOPHEN 325 MG PO TABS: 650.0000 mg | ORAL_TABLET | ORAL | Status: DC | PRN

## 2019-12-03 MED ORDER — PROTHROMBIN COMPLEX CONC HUMAN 500 UNITS IV KIT
1500.0000 [IU] | PACK | Status: DC
  Filled 2019-12-03: qty 1500

## 2019-12-03 MED ORDER — MORPHINE SULFATE (PF) 2 MG/ML IV SOLN
2.0000 mg | INTRAVENOUS | Status: DC | PRN
  Administered 2019-12-03 – 2019-12-04 (×2): 2 mg via INTRAVENOUS
  Filled 2019-12-03 (×2): qty 1

## 2019-12-03 MED ORDER — VITAMIN K1 10 MG/ML IJ SOLN
INTRAMUSCULAR | Status: AC
  Filled 2019-12-03: qty 1

## 2019-12-03 MED ORDER — FENTANYL CITRATE (PF) 100 MCG/2ML IJ SOLN: 75.0000 ug | Freq: Once | INTRAMUSCULAR | Status: AC

## 2019-12-03 MED ORDER — IOHEXOL 350 MG/ML SOLN
100.0000 mL | Freq: Once | INTRAVENOUS | Status: AC | PRN
  Administered 2019-12-03: 100 mL via INTRAVENOUS

## 2019-12-03 MED ORDER — OXYCODONE HCL 5 MG PO TABS: 5.0000 mg | ORAL_TABLET | ORAL | Status: DC | PRN

## 2019-12-03 MED ORDER — DEXTROSE-NACL 5-0.45 % IV SOLN: INTRAVENOUS | Status: DC

## 2019-12-03 MED ORDER — ONDANSETRON HCL 4 MG/2ML IJ SOLN: 4.0000 mg | Freq: Four times a day (QID) | INTRAMUSCULAR | Status: DC | PRN

## 2019-12-03 MED ORDER — SACUBITRIL-VALSARTAN 24-26 MG PO TABS
1.0000 | ORAL_TABLET | Freq: Every day | ORAL | Status: DC
  Administered 2019-12-04: 1 via ORAL
  Filled 2019-12-03 (×2): qty 1

## 2019-12-03 MED ORDER — PROTHROMBIN COMPLEX CONC HUMAN 500 UNITS IV KIT
1598.0000 [IU] | PACK | Status: AC
  Administered 2019-12-03: 1598 [IU] via INTRAVENOUS
  Filled 2019-12-03: qty 1598

## 2019-12-03 MED ORDER — VITAMIN K1 10 MG/ML IJ SOLN
10.0000 mg | INTRAVENOUS | Status: AC
  Administered 2019-12-03: 10 mg via INTRAVENOUS
  Filled 2019-12-03: qty 1

## 2019-12-03 MED ORDER — LIDOCAINE-EPINEPHRINE (PF) 2 %-1:200000 IJ SOLN
10.0000 mL | Freq: Once | INTRAMUSCULAR | Status: AC
  Administered 2019-12-03: 10 mL via INTRADERMAL
  Filled 2019-12-03: qty 20

## 2019-12-03 NOTE — ED Notes (Signed)
Per wife via phone, Reports patient fell on Wednesday, went to [facility]. Injuries incl. hitting head and chest when fall occurred. Chest xray showed possible pneumonia, rx for levaquin. Pt fell in aug, had subdermal hematoma, hosp

## 2019-12-03 NOTE — ED Triage Notes (Signed)
Pt arrives via EMS with c/o shob since fall on 08/22, and abdominal pain with urinary retention that pt reports as starting yesterday. Pt endorses defibrillator, right side CP. States has appt for ct on Monday r/t CP

## 2019-12-03 NOTE — ED Notes (Signed)
ED Provider at bedside. 

## 2019-12-03 NOTE — ED Provider Notes (Signed)
Pt transferred here from [facility] for trauma admission.  Pt had a fall which resulted in a large right sided pneumothorax and pneumomediastinum with several rib fx.  Pt also had a large right SDH.  Chest tube was placed at med center.  He was given Eppie Gibson for coumadin reversal.  Pt is feeling better and pain has improved since chest tube and meds.  He remains stable. Dr. Kieth Brightly (trauma) will admit.    Isla Pence, MD 12/03/19 [phone]

## 2019-12-03 NOTE — ED Notes (Signed)
Pt returned to room CT rescheduled for midnight

## 2019-12-03 NOTE — ED Notes (Signed)
PT brought in by [facility] from [facility]. PT comes with right side chest tube and 67fr foley cath and A&Ox4. No complaints at this time.

## 2019-12-03 NOTE — Consult Note (Signed)
Reason for Consult:fall, traumatic tentorial Right subural, subarachnoid hemorrhage Referring Physician: Trauma ED  Kevin Hart is an 74 y.o. male.  HPI: whom was recently admitted for a subdural hematoma in July. He was also seen in the ED on August 4 after another fall. He was discharged from the  ED at that time. He is readmitted today, falling five days ago secondary to a pnemothorax, and a new traumatic right tentorial subdural and a small subarachnoid hemorrhage. He has a normal exam by report.  He presented today with urinary retention, and poor balance. Resulting workup today revealed the subdural, and pneumothorax. Past Medical History:  Diagnosis Date  . Arthritis   . Bladder stone 10/26/2017  . BPH (benign prostatic hyperplasia)    takes Flomax daily  . CAD (coronary artery disease)    a. prior LAD stenting;  b. 06/2014 NSTEMI/PCI: LM nl, LAD 30p, 7m ISR (3.0x20 Promus DES), LCX nl, RCA nondom, nl, EF 25-30% ant, apical, dist inf [place].  . Cataracts, bilateral    immature  . Chronic systolic CHF (congestive heart failure) ([facility]) 12/31/2014  . DVT (deep venous thrombosis) ([facility])    RECURRENT left leg  . Dyslipidemia   . GERD (gastroesophageal reflux disease)   . Gout   . History of kidney stones   . Hypertension   . Ischemic cardiomyopathy 07/06/2014  . Lupus anticoagulant positive   . Myocardial infarct ([facility]) 2000  . Myocardial infarct ([facility] [facility]) 2016  . S/P drug eluting coronary stent placement, 07/06/14, Promus 07/07/2014  . Sleep apnea    biPaP  . Subarachnoid bleed ([place]) 2015   after falling and hitting his head    Past Surgical History:  Procedure Laterality Date  . COLONOSCOPY    . EP IMPLANTABLE DEVICE [name] 11/27/2014   [employer] Fortify Assura VR  implanted by Dr Rayann Heman for primary prevention  . LEFT HEART CATH AND CORONARY ANGIOGRAPHY [name] 07/29/2018   Procedure: LEFT HEART CATH AND CORONARY ANGIOGRAPHY;  Surgeon: Sherren Mocha, MD;  Location: [facility];  Service: Cardiovascular;  Laterality: [name];  . LEFT HEART CATH AND CORONARY ANGIOGRAPHY [name] 08/14/2019   Procedure: LEFT HEART CATH AND CORONARY ANGIOGRAPHY;  Surgeon: Troy Sine, MD;  Location: [facility];  Service: Cardiovascular;  Laterality: [name];  . LEFT HEART CATHETERIZATION WITH CORONARY ANGIOGRAM [name] 07/06/2014   Procedure: LEFT HEART CATHETERIZATION WITH CORONARY ANGIOGRAM;  Surgeon: Peter M Martinique, MD;  Location: [facility] CATH LAB;  Service: Cardiovascular;  Laterality: [name];  . PERCUTANEOUS CORONARY STENT INTERVENTION (PCI-S) Right 07/06/2014   Procedure: PERCUTANEOUS CORONARY STENT INTERVENTION (PCI-S);  Surgeon: Peter M Martinique, MD;  Location: [facility] CATH LAB;  Service: Cardiovascular;  Laterality: Right;  . SHOULDER ARTHROSCOPY WITH SUBACROMIAL DECOMPRESSION Left 03/05/2016   Procedure: SHOULDER ARTHROSCOPY ROTATOR CUFF REPAIR WITH SUBACROMIAL DECOMPRESSION;  Surgeon: Tania Ade, MD;  Location: [facility];  Service: Orthopedics;  Laterality: Left;  SHOULDER ARTHROSCOPY ROTATOR CUFF REPAIR WITH SUBACROMIAL DECOMPRESSION  . SHOULDER CLOSED REDUCTION Left 11/29/2015   Procedure: CLOSED REDUCTION SHOULDER;  Surgeon: Tania Ade, MD;  Location: [facility];  Service: Orthopedics;  Laterality: Left;  . TRANSURETHRAL RESECTION OF PROSTATE [name] 02/24/2018   Procedure: TRANSURETHRAL RESECTION OF THE PROSTATE (TURP);  Surgeon: Irine Seal, MD;  Location: [facility];  Service: Urology;  Laterality: [name];  . WRIST SURGERY Bilateral    plates and screws     Family History  Problem Relation Age of Onset  . Aneurysm Father  Social History:  reports that he has never smoked. He has never used smokeless tobacco. He reports current alcohol use. He reports that he does not use drugs.  Allergies: No Known Allergies  Medications: I have reviewed the patient's current medications.  Results for orders placed or performed during the hospital encounter of 12/03/19 (from the past 48 hour(s))  SARS  Coronavirus 2 by RT PCR (hospital order, performed in [facility] hospital lab) Nasopharyngeal Nasopharyngeal Swab     Status: None   Collection Time: 12/03/19 10:35 AM   Specimen: Nasopharyngeal Swab  Result Value Ref Range   SARS Coronavirus 2 NEGATIVE NEGATIVE    Comment: (NOTE) SARS-CoV-2 target nucleic acids are NOT DETECTED.  The SARS-CoV-2 RNA is generally detectable in upper and lower respiratory specimens during the acute phase of infection. The lowest concentration of SARS-CoV-2 viral copies this assay can detect is 250 copies / mL. A negative result does not preclude SARS-CoV-2 infection and should not be used as the sole basis for treatment or other patient management decisions.  A negative result may occur with improper specimen collection / handling, submission of specimen other than nasopharyngeal swab, presence of viral mutation(s) within the areas targeted by this assay, and inadequate number of viral copies (<250 copies / mL). A negative result must be combined with clinical observations, patient history, and epidemiological information.  Fact Sheet for Patients:   StrictlyIdeas.no  Fact Sheet for Healthcare Providers: BankingDealers.co.za  This test is not yet approved or  cleared by the [place] FDA and has been authorized for detection and/or diagnosis of SARS-CoV-2 by FDA under an Emergency Use Authorization (EUA).  This EUA will remain in effect (meaning this test can be used) for the duration of the COVID-19 declaration under Section 564(b)(1) of the Act, 21 U.S.C. section 360bbb-3(b)(1), unless the authorization is terminated or revoked sooner.  Performed at [facility], [address] [place], [place] [address]   CBC with Differential     Status: None   Collection Time: 12/03/19 10:36 AM  Result Value Ref Range   WBC 8.6 4.0 - 10.5 K/uL   RBC 4.76 4.22 - 5.81 MIL/uL   Hemoglobin 14.6  13.0 - 17.0 g/dL   HCT 43.8 39 - 52 %   MCV 92.0 80.0 - 100.0 fL   MCH 30.7 26.0 - 34.0 pg   MCHC 33.3 30.0 - 36.0 g/dL   RDW 14.2 11.5 - 15.5 %   Platelets 213 150 - 400 K/uL   nRBC 0.0 0.0 - 0.2 %   Neutrophils Relative % 66 %   Neutro Abs 5.7 1.7 - 7.7 K/uL   Lymphocytes Relative 20 %   Lymphs Abs 1.7 0.7 - 4.0 K/uL   Monocytes Relative 9 %   Monocytes Absolute 0.8 0 - 1 K/uL   Eosinophils Relative 3 %   Eosinophils Absolute 0.3 0 - 0 K/uL   Basophils Relative 1 %   Basophils Absolute 0.1 0 - 0 K/uL   Immature Granulocytes 1 %   Abs Immature Granulocytes 0.06 0.00 - 0.07 K/uL    Comment: Performed at [facility], [address] [place], [place] [address]  Comprehensive metabolic panel     Status: Abnormal   Collection Time: 12/03/19 10:36 AM  Result Value Ref Range   Sodium 141 135 - 145 mmol/L   Potassium 3.6 3.5 - 5.1 mmol/L   Chloride 104 98 - 111 mmol/L   CO2 26 22 -  32 mmol/L   Glucose, Bld 121 (H) 70 - 99 mg/dL    Comment: Glucose reference range applies only to samples taken after fasting for at least 8 hours.   BUN 14 8 - 23 mg/dL   Creatinine, Ser 0.96 0.61 - 1.24 mg/dL   Calcium 9.7 8.9 - 10.3 mg/dL   Total Protein 6.8 6.5 - 8.1 g/dL   Albumin 3.4 (L) 3.5 - 5.0 g/dL   AST 18 15 - 41 U/L   ALT 12 0 - 44 U/L   Alkaline Phosphatase 32 (L) 38 - 126 U/L   Total Bilirubin 1.5 (H) 0.3 - 1.2 mg/dL   GFR calc non Af Amer >60 >60 mL/min   GFR calc Af Amer >60 >60 mL/min   Anion gap 11 5 - 15    Comment: Performed at [facility], [address] [place], [place] [address]  Troponin I (High Sensitivity)     Status: None   Collection Time: 12/03/19 10:36 AM  Result Value Ref Range   Troponin I (High Sensitivity) 14 <18 ng/L    Comment: (NOTE) Elevated high sensitivity troponin I (hsTnI) values and significant  changes across serial measurements may suggest ACS but many other  chronic and acute conditions are known to elevate hsTnI results.   Refer to the "Links" section for chest pain algorithms and additional  guidance. Performed at [facility], [address] [place], [place] [address]   Urinalysis, Routine w reflex microscopic Urine, Clean Catch     Status: None   Collection Time: 12/03/19 12:20 PM  Result Value Ref Range   Color, Urine YELLOW YELLOW   APPearance CLEAR CLEAR   Specific Gravity, Urine 1.025 1.005 - 1.030   pH 5.5 5.0 - 8.0   Glucose, UA NEGATIVE NEGATIVE mg/dL   Hgb urine dipstick NEGATIVE NEGATIVE   Bilirubin Urine NEGATIVE NEGATIVE   Ketones, ur NEGATIVE NEGATIVE mg/dL   Protein, ur NEGATIVE NEGATIVE mg/dL   Nitrite NEGATIVE NEGATIVE   Leukocytes,Ua NEGATIVE NEGATIVE    Comment: Microscopic not done on urines with negative protein, blood, leukocytes, nitrite, or glucose < 500 mg/dL. Performed at [facility], [address] [place], [place] [address]   Troponin I (High Sensitivity)     Status: None   Collection Time: 12/03/19  1:05 PM  Result Value Ref Range   Troponin I (High Sensitivity) 17 <18 ng/L    Comment: (NOTE) Elevated high sensitivity troponin I (hsTnI) values and significant  changes across serial measurements may suggest ACS but many other  chronic and acute conditions are known to elevate hsTnI results.  Refer to the "Links" section for chest pain algorithms and additional  guidance. Performed at [facility], [address] [place], [place] [address]   Protime-INR     Status: Abnormal   Collection Time: 12/03/19  1:05 PM  Result Value Ref Range   Prothrombin Time 34.2 (H) 11.4 - 15.2 seconds   INR 3.5 (H) 0.8 - 1.2    Comment: (NOTE) INR goal varies based on device and disease states. Performed at [facility], [address] [place], [place] [address]   Protime-INR     Status: Abnormal   Collection Time: 12/03/19  5:10 PM  Result Value Ref Range   Prothrombin Time 16.0 (H) 11.4 - 15.2 seconds   INR 1.3 (H) 0.8 - 1.2     Comment: (NOTE) INR goal varies  based on device and disease states. Performed at [facility], [address], [place], [place] [address]     CT ANGIO HEAD W OR WO CONTRAST  Result Date: 12/03/2019 CLINICAL DATA:  Intracranial hemorrhage. Extra-axial hemorrhage along the right cerebellum and inferior to the tentorium. Subarachnoid hemorrhage EXAM: CT ANGIOGRAPHY HEAD AND NECK TECHNIQUE: Multidetector CT imaging of the head and neck was performed using the standard protocol during bolus administration of intravenous contrast. Multiplanar CT image reconstructions and MIPs were obtained to evaluate the vascular anatomy. Carotid stenosis measurements (when applicable) are obtained utilizing NASCET criteria, using the distal internal carotid diameter as the denominator. CONTRAST:  139mL OMNIPAQUE IOHEXOL 350 MG/ML SOLN COMPARISON:  CT head without contrast 12/03/2019. FINDINGS: CT HEAD FINDINGS CTA NECK FINDINGS Aortic arch: A 3 vessel arch configuration is present. Great vessel origins are within normal limits. Atherosclerotic changes are present in the distal arch. No aneurysm or stenosis is present. No traumatic injury is present. Right carotid system: The right common carotid artery is within normal limits. Bifurcation is unremarkable. Cervical right ICA is normal. Left carotid system: The left common carotid artery is within normal limits. Minimal atherosclerotic changes are noted at the bifurcation without significant stenosis. The cervical left ICA is normal. Vertebral arteries: The right vertebral artery is dominant. Both vertebral arteries originate from the subclavian arteries without significant stenosis. No significant stenosis is present in either vertebral artery in the neck. Skeleton: Degenerative anterolisthesis is present at C4-5. Multilevel degenerative endplate changes are present the lower cervical and upper thoracic spine. No acute fractures are present. Other neck: Extensive  subcutaneous emphysema is noted salivary glands and ducts are within normal limits. No significant adenopathy is present. Multinodular thyroid is present. The largest lesion is in the left lobe measuring 2.5 cm. Upper chest: A large right pneumothorax is present. Right pleural effusion is present. Pneumomediastinum is noted. Anterior right second rib fracture is noted. Review of the MIP images confirms the above findings CTA HEAD FINDINGS Anterior circulation: Atherosclerotic changes are present within the cavernous internal carotid arteries bilaterally without significant stenosis from the skull base through the ICA termini. The A1 and M1 segments are normal. The anterior communicating artery is patent. MCA bifurcations are intact. The ACA and MCA branch vessels are within normal limits. Posterior circulation: The right vertebral artery is dominant vessel. The PICA origins are visualized and normal. Vertebrobasilar junction is normal. Both posterior cerebral arteries originate from the basilar tip. The PCA branch vessels are within normal limits bilaterally. Venous sinuses: The dural sinuses are patent. The straight sinus and deep cerebral veins are intact. Cortical veins are unremarkable. Anatomic variants: None Review of the MIP images confirms the above findings IMPRESSION: 1. Large right pneumothorax with pneumomediastinum and right pleural effusion. 2. Anterior right second rib fracture. 3. Normal CTA Circle of Willis without significant proximal stenosis, aneurysm, or branch vessel occlusion. The extra-axial hemorrhage is likely posttraumatic. 4. Minimal atherosclerotic changes at the left carotid bifurcation and bilateral cavernous internal carotid arteries without significant stenosis. 5. Extensive subcutaneous emphysema. 6. Multinodular thyroid. The largest lesion is in the left lobe measuring 2.5 cm. Recommend thyroid US (ref: J Am Coll Radiol. 2015 Feb;12(2): 143-50). Electronically Signed   By:  San Morelle M.D.   On: 12/03/2019 13:28   CT Head Wo Contrast  Result Date: 12/03/2019 CLINICAL DATA:  Fall this past Wednesday, minor head trauma EXAM: CT HEAD WITHOUT CONTRAST TECHNIQUE: Contiguous axial images were obtained from the base of  the skull through the vertex without intravenous contrast. COMPARISON:  Recent prior head CT 10/25/2019 FINDINGS: Brain: Multifocal intracranial hemorrhage. There is a relatively large subdural hematoma layering along the inferior aspect of the right tentorium cerebellum measuring 2.9 x 2.3 x 3.2 cm (volume = 11 cm^3). Additionally, there are foci subarachnoid hemorrhage layering within the sulci overlying the left cerebral hemisphere, and within the right parietal lobe sulci and left temporal lobe sulci. The subdural hematoma along the right tentorium exerts mass effect on the underlying cerebellum and also effaces the CSF space around the right hemi pons. No evidence of intraventricular hemorrhage. No evidence of intra or entrapment at this time. Stable cortical and central atrophy with ex vacuo ventriculomegaly of the lateral ventricles. No evidence of acute ischemia. Vascular: No hyperdense vessel or unexpected calcification. Skull: Normal. Negative for fracture or focal lesion. Sinuses/Orbits: No acute finding. Other: Extensive subcutaneous emphysema tracking along the muscular and soft tissue planes of the skull base and upper neck. IMPRESSION: 1. Multifocal intracranial hemorrhage most significantly a moderately large acute subdural hematoma along the inferior surface of the right tentorium cerebellum with local mass effect on the superior aspect of the right cerebellar hemisphere and effacement of the CSF spaces adjacent to the right pons. 2. Additional multifocal scattered foci of subarachnoid hemorrhage bilaterally. 3. Extensive subcutaneous emphysema. Critical Value/emergent results were called by telephone at the time of interpretation on 12/03/2019 at  12:14 pm to provider DAN FLOYD , who verbally acknowledged these results. Electronically Signed   By: Jacqulynn Cadet M.D.   On: 12/03/2019 12:14   CT ANGIO NECK W OR WO CONTRAST  Result Date: 12/03/2019 CLINICAL DATA:  Intracranial hemorrhage. Extra-axial hemorrhage along the right cerebellum and inferior to the tentorium. Subarachnoid hemorrhage EXAM: CT ANGIOGRAPHY HEAD AND NECK TECHNIQUE: Multidetector CT imaging of the head and neck was performed using the standard protocol during bolus administration of intravenous contrast. Multiplanar CT image reconstructions and MIPs were obtained to evaluate the vascular anatomy. Carotid stenosis measurements (when applicable) are obtained utilizing NASCET criteria, using the distal internal carotid diameter as the denominator. CONTRAST:  118mL OMNIPAQUE IOHEXOL 350 MG/ML SOLN COMPARISON:  CT head without contrast 12/03/2019. FINDINGS: CT HEAD FINDINGS CTA NECK FINDINGS Aortic arch: A 3 vessel arch configuration is present. Great vessel origins are within normal limits. Atherosclerotic changes are present in the distal arch. No aneurysm or stenosis is present. No traumatic injury is present. Right carotid system: The right common carotid artery is within normal limits. Bifurcation is unremarkable. Cervical right ICA is normal. Left carotid system: The left common carotid artery is within normal limits. Minimal atherosclerotic changes are noted at the bifurcation without significant stenosis. The cervical left ICA is normal. Vertebral arteries: The right vertebral artery is dominant. Both vertebral arteries originate from the subclavian arteries without significant stenosis. No significant stenosis is present in either vertebral artery in the neck. Skeleton: Degenerative anterolisthesis is present at C4-5. Multilevel degenerative endplate changes are present the lower cervical and upper thoracic spine. No acute fractures are present. Other neck: Extensive subcutaneous  emphysema is noted salivary glands and ducts are within normal limits. No significant adenopathy is present. Multinodular thyroid is present. The largest lesion is in the left lobe measuring 2.5 cm. Upper chest: A large right pneumothorax is present. Right pleural effusion is present. Pneumomediastinum is noted. Anterior right second rib fracture is noted. Review of the MIP images confirms the above findings CTA HEAD FINDINGS Anterior circulation: Atherosclerotic changes are present  within the cavernous internal carotid arteries bilaterally without significant stenosis from the skull base through the ICA termini. The A1 and M1 segments are normal. The anterior communicating artery is patent. MCA bifurcations are intact. The ACA and MCA branch vessels are within normal limits. Posterior circulation: The right vertebral artery is dominant vessel. The PICA origins are visualized and normal. Vertebrobasilar junction is normal. Both posterior cerebral arteries originate from the basilar tip. The PCA branch vessels are within normal limits bilaterally. Venous sinuses: The dural sinuses are patent. The straight sinus and deep cerebral veins are intact. Cortical veins are unremarkable. Anatomic variants: None Review of the MIP images confirms the above findings IMPRESSION: 1. Large right pneumothorax with pneumomediastinum and right pleural effusion. 2. Anterior right second rib fracture. 3. Normal CTA Circle of Willis without significant proximal stenosis, aneurysm, or branch vessel occlusion. The extra-axial hemorrhage is likely posttraumatic. 4. Minimal atherosclerotic changes at the left carotid bifurcation and bilateral cavernous internal carotid arteries without significant stenosis. 5. Extensive subcutaneous emphysema. 6. Multinodular thyroid. The largest lesion is in the left lobe measuring 2.5 cm. Recommend thyroid US (ref: J Am Coll Radiol. 2015 Feb;12(2): 143-50). Electronically Signed   By: San Morelle  M.D.   On: 12/03/2019 13:28   CT CHEST ABDOMEN PELVIS W CONTRAST  Result Date: 12/03/2019 CLINICAL DATA:  Trauma.  Pneumothorax and subcutaneous emphysema. EXAM: CT CHEST, ABDOMEN, AND PELVIS WITH CONTRAST TECHNIQUE: Multidetector CT imaging of the chest, abdomen and pelvis was performed following the standard protocol during bolus administration of intravenous contrast. CONTRAST:  149mL OMNIPAQUE IOHEXOL 350 MG/ML SOLN COMPARISON:  Chest x-ray 12/03/2019 FINDINGS: CT CHEST FINDINGS Cardiovascular: The heart is normal in size. No pericardial effusion. Mild tortuosity and calcification of the thoracic aorta but no aneurysm or dissection. The branch vessels are patent. LAD stent noted. Pacer wires in good position without complicating features. There is mild shift of the heart and mediastinum to the left side due to a large right pneumothorax. Mediastinum/Nodes: No mediastinal or hilar mass or adenopathy. There is extensive pneumomediastinum. The esophagus is grossly normal. There is a complex left thyroid lobe lesion measuring 2.4 cm. Recommend thyroid US (ref: J Am Coll Radiol. 2015 Feb;12(2): 143-50). Lungs/Pleura: There is a large right-sided pneumothorax estimated at 60%. There is mild shift of the heart mediastinum to the left suggesting a tension pneumothorax. Associated significant right lower lobe atelectasis and small to moderate-sized right pleural effusion. The left lung is clear. Musculoskeletal: There is a displaced right second anterior rib fracture and associated adjacent pleural hematoma. This likely causes the pneumothorax. There also nondisplaced fractures involving the third and fourth ribs. No sternal fractures identified. Suspect remote healed upper sternal fracture. The thoracic vertebral bodies are normally aligned. No acute fracture. There are remote healed fractures involving the left posterior ribs and also a remote fracture of the left humeral head. CT ABDOMEN PELVIS FINDINGS  Hepatobiliary: No acute hepatic injury is identified. No perihepatic fluid collections. No hepatic lesions. The gallbladder is normal. No common bile duct dilatation. Pancreas: No mass, inflammation or ductal dilatation. No acute pancreatic injury or peripancreatic fluid collection. Spleen: Normal size. No acute splenic injury or perisplenic fluid collection. Adrenals/Urinary Tract: The adrenal glands and kidneys are unremarkable except for a large simple appearing left renal cyst, right-sided parapelvic renal cysts and a lower pole left renal calculus. No acute renal injury or perirenal fluid collections. There is a Foley catheter noted in the bladder. There is a large bladder calculus measuring  16 mm. Stomach/Bowel: The stomach, duodenum, small bowel and colon are grossly normal. No acute inflammatory changes, mass lesions or obstructive findings. Vascular/Lymphatic: 4.7 cm infrarenal abdominal aortic aneurysm ending just above the iliac artery bifurcation. No dissection. Moderate atherosclerotic calcifications. Mild fusiform enlargement of both iliac arteries but no focal aneurysm or dissection. The major venous structures are patent. No mesenteric or retroperitoneal mass, adenopathy or hematoma. Reproductive: The prostate gland and seminal vesicles are grossly normal. Other: No pelvic mass or adenopathy. No free pelvic fluid collections. No inguinal mass or adenopathy. No abdominal wall hernia or subcutaneous lesions. Musculoskeletal: Advanced degenerative lumbar spondylosis with multilevel disc disease and facet disease but no acute lumbar spine fracture. The bony pelvis is intact.  No hip or pelvic fractures. IMPRESSION: 1. Large right-sided pneumothorax estimated at 60% with mild shift of the heart and mediastinum to the left side suggesting a tension pneumothorax. 2. Right-sided rib fractures as discussed above. The displaced right second anterior rib fracture is likely the cause of the pneumothorax. 3.  Large amount of pneumomediastinum. 4. No acute abdominal/pelvic findings, mass lesions or adenopathy. No acute abdominal/pelvic injury is identified. 5. 4.7 cm infrarenal abdominal aortic aneurysm. Recommend follow-up every 6 months and vascular consultation. This recommendation follows ACR consensus guidelines: White Paper of the ACR Incidental Findings Committee II on Vascular Findings. J Am Coll Radiol 2013; 10:789-794. 6. 2.4 cm left thyroid lesion. Recommend thyroid US (ref: J Am Coll Radiol. 2015 Feb;12(2): 143-50). 7. Large bladder calculus. These results were called by telephone at the time of interpretation on 12/03/2019 at 1:07 pm to provider DAN FLOYD , who verbally acknowledged these results. Aortic Atherosclerosis (ICD10-I70.0). Electronically Signed   By: Marijo Sanes M.D.   On: 12/03/2019 13:05   DG Chest Portable 1 View  Result Date: 12/03/2019 CLINICAL DATA:  Pneumothorax, post chest tube placement EXAM: PORTABLE CHEST 1 VIEW COMPARISON:  CT 12/03/2019 FINDINGS: Partially coiled right pleural drain projects over the right upper lung. Question a trace residual right apical pneumothorax. Opacity in the right mid to lower lung lung base which could reflect residual atelectasis or some mild re-expansion edema or underlying consolidation. Additional atelectatic changes present in the left lung. Subcutaneous emphysema seen across the right chest wall and base of the neck bilaterally. Small volume pneumomediastinum remains. AICD battery pack projects over the left chest wall with lead in stable position towards the cardiac apex. Coronary stent is noted. Remaining cardiomediastinal contours are unremarkable. Rib fractures are better seen on comparison radiograph. IMPRESSION: 1. Question a trace residual right apical pneumothorax despite the placement of a right chest tube partially coiled in right upper lung. 2. Opacity in the right mid to lower lung base which could reflect residual atelectasis, some  mild re-expansion edema or underlying consolidation. 3. Extensive subcutaneous emphysema across the right chest wall and base of the neck bilaterally. 4. Persistent pneumomediastinum. 5. Multiple right rib fractures better seen on CT. Electronically Signed   By: Lovena Le M.D.   On: 12/03/2019 15:01   DG Chest Portable 1 View  Result Date: 12/03/2019 CLINICAL DATA:  Shortness of breath, urinary retention, laps lung, CHF, ischemic cardiomyopathy, coronary artery disease post MI, hypertension EXAM: PORTABLE CHEST 1 VIEW COMPARISON:  Portable exam 1007 hours compared to 08/11/2019 FINDINGS: LEFT subclavian ICD lead projects over RIGHT ventricle. Normal heart size post coronary stenting. Mediastinal contours and pulmonary vascularity normal. Asymmetric lucency of the RIGHT hemithorax at least in part due to superimposed chest wall emphysema, which extends  into the cervical regions bilaterally. Small loculated pneumothorax identified at medial RIGHT upper lobe. Subsegmental atelectasis at bases. Remaining lungs clear without pulmonary infiltrate or pleural effusion. Bones demineralized. IMPRESSION: Significant RIGHT chest wall emphysema extending into the cervical regions bilaterally. Small loculated pneumothorax at medial RIGHT upper lobe. Critical Value/emergent results were called by telephone at the time of interpretation on 12/03/2019 at 10:33 am to provider DAN FLOYD MD, who verbally acknowledged these results. Electronically Signed   By: Lavonia Dana M.D.   On: 12/03/2019 10:34    Review of Systems  Constitutional: Negative.   HENT: Negative.   Eyes: Negative.   Respiratory: Negative for shortness of breath.    Blood pressure 134/69, pulse 73, temperature 99.2 F (37.3 C), temperature source Oral, resp. rate (!) 21, height 5\' 11"  (1.803 m), weight 93 kg, SpO2 98 %. Physical Exam Constitutional:      General: He is not in acute distress.    Appearance: He is well-developed.  HENT:     Head:  Normocephalic.     Mouth/Throat:     Mouth: Mucous membranes are moist.     Pharynx: Oropharynx is clear.  Eyes:     Extraocular Movements: Extraocular movements intact.     Pupils: Pupils are equal, round, and reactive to light.  Musculoskeletal:     Cervical back: Normal range of motion and neck supple.  Skin:    General: Skin is warm and dry.     Findings: Ecchymosis present.  Neurological:     General: No focal deficit present.     Mental Status: He is alert and oriented to person, place, and time.     GCS: GCS eye subscore is 4. GCS verbal subscore is 5. GCS motor subscore is 6.     Motor: Tremor present. No weakness, atrophy, seizure activity or pronator drift.     Deep Tendon Reflexes: Babinski sign absent on the right side. Babinski sign absent on the left side.     Comments: Slight decrease in hearing Gait not assessed  Psychiatric:        Mood and Affect: Mood normal.        Behavior: Behavior normal.     Assessment/Plan: Traumatic subudral/subaracnoid with good neurological exam. Will order head ct for tomorrow, or earlier if exam were to change.  No anticoagulation in future. If IVC filter can be placed, strong consideration must be given. 3 falls in 3 weeks means he continues to be high risk.   Ashok Pall 12/03/2019, 5:41 PM

## 2019-12-03 NOTE — H&P (Signed)
Reason for Consult: Fall  Kevin Hart is an 74 y.o. male with PMH of OSA, CAD (prior [facility] 07/2019), prior DVT (on coumadin), HTN that presents after a fall 5 days ago. Patient reports he can't remember how he fell, but most likely tripped while he was walking. He does not remember hitting his head. Of note, he has fallen x3 in the last 3 weeks. Of note, patient lives in two story home and uses walker for assistance with ambulation. He has been attending outpatient rehab.  He went to ED earlier today due to urinary retention and feeling "off balance". Work up there included CT head, CAP, CTA head/neck, which were significant for the following injuries:   Right cerebellum subdural Subarhachnoid hemorrhage Large right side PTX with subcutaneous emphysema Right sided 2-4 rib fractures    Labs significant for INR 3.5, he was given [place]. Right sided pigtail was placed prior to transfer. On arrival, GCS 15, O2 sats in low 90s on RA. Having significant right sided chest wall tenderness.  Past Medical History:  Diagnosis Date  . Arthritis   . Bladder stone 10/26/2017  . BPH (benign prostatic hyperplasia)    takes Flomax daily  . CAD (coronary artery disease)    a. prior LAD stenting;  b. 06/2014 NSTEMI/PCI: LM nl, LAD 30p, 26m ISR (3.0x20 Promus DES), LCX nl, RCA nondom, nl, EF 25-30% ant, apical, dist inf [place].  . Cataracts, bilateral    immature  . Chronic systolic CHF (congestive heart failure) ([place]) 12/31/2014  . DVT (deep venous thrombosis) ([facility])    RECURRENT left leg  . Dyslipidemia   . GERD (gastroesophageal reflux disease)   . Gout   . History of kidney stones   . Hypertension   . Ischemic cardiomyopathy 07/06/2014  . Lupus anticoagulant positive   . Myocardial infarct ([place]) 2000  . Myocardial infarct ([place]) 2016  . S/P drug eluting coronary stent placement, 07/06/14, Promus 07/07/2014  . Sleep apnea    biPaP  . Subarachnoid bleed ([place]) 2015   after falling and hitting his  head    Past Surgical History:  Procedure Laterality Date  . COLONOSCOPY    . EP IMPLANTABLE DEVICE [name] 11/27/2014   [employer] Fortify Assura VR  implanted by Dr Rayann Heman for primary prevention  . LEFT HEART CATH AND CORONARY ANGIOGRAPHY [name] 07/29/2018   Procedure: LEFT HEART CATH AND CORONARY ANGIOGRAPHY;  Surgeon: Sherren Mocha, MD;  Location: [place] [facility];  Service: Cardiovascular;  Laterality: [name];  . LEFT HEART CATH AND CORONARY ANGIOGRAPHY [name] 08/14/2019   Procedure: LEFT HEART CATH AND CORONARY ANGIOGRAPHY;  Surgeon: Troy Sine, MD;  Location: [place] [facility];  Service: Cardiovascular;  Laterality: [name];  . LEFT HEART CATHETERIZATION WITH CORONARY ANGIOGRAM [name] 07/06/2014   Procedure: LEFT HEART CATHETERIZATION WITH CORONARY ANGIOGRAM;  Surgeon: Peter M Martinique, MD;  Location: [facility] CATH LAB;  Service: Cardiovascular;  Laterality: [name];  . PERCUTANEOUS CORONARY STENT INTERVENTION (PCI-S) Right 07/06/2014   Procedure: PERCUTANEOUS CORONARY STENT INTERVENTION (PCI-S);  Surgeon: Peter M Martinique, MD;  Location: [facility] CATH LAB;  Service: Cardiovascular;  Laterality: Right;  . SHOULDER ARTHROSCOPY WITH SUBACROMIAL DECOMPRESSION Left 03/05/2016   Procedure: SHOULDER ARTHROSCOPY ROTATOR CUFF REPAIR WITH SUBACROMIAL DECOMPRESSION;  Surgeon: Tania Ade, MD;  Location: [place];  Service: Orthopedics;  Laterality: Left;  SHOULDER ARTHROSCOPY ROTATOR CUFF REPAIR WITH SUBACROMIAL DECOMPRESSION  . SHOULDER CLOSED REDUCTION Left 11/29/2015   Procedure: CLOSED REDUCTION SHOULDER;  Surgeon: Tania Ade,  MD;  Location: [place];  Service: Orthopedics;  Laterality: Left;  . TRANSURETHRAL RESECTION OF PROSTATE [name] 02/24/2018   Procedure: TRANSURETHRAL RESECTION OF THE PROSTATE (TURP);  Surgeon: Irine Seal, MD;  Location: [facility];  Service: Urology;  Laterality: [name];  . WRIST SURGERY Bilateral    plates and screws     Family History  Problem Relation Age of Onset  . Aneurysm Father     Social  History:  reports that he has never smoked. He has never used smokeless tobacco. He reports current alcohol use. He reports that he does not use drugs. Lives with his wife in two story home. Uses walker for ambulation.   Allergies: No Known Allergies  Medications: I have reviewed the patient's current medications.  Results for orders placed or performed during the hospital encounter of 12/03/19 (from the past 48 hour(s))  SARS Coronavirus 2 by RT PCR (hospital order, performed in [facility] hospital lab) Nasopharyngeal Nasopharyngeal Swab     Status: None   Collection Time: 12/03/19 10:35 AM   Specimen: Nasopharyngeal Swab  Result Value Ref Range   SARS Coronavirus 2 NEGATIVE NEGATIVE    Comment: (NOTE) SARS-CoV-2 target nucleic acids are NOT DETECTED.  The SARS-CoV-2 RNA is generally detectable in upper and lower respiratory specimens during the acute phase of infection. The lowest concentration of SARS-CoV-2 viral copies this assay can detect is 250 copies / mL. A negative result does not preclude SARS-CoV-2 infection and should not be used as the sole basis for treatment or other patient management decisions.  A negative result may occur with improper specimen collection / handling, submission of specimen other than nasopharyngeal swab, presence of viral mutation(s) within the areas targeted by this assay, and inadequate number of viral copies (<250 copies / mL). A negative result must be combined with clinical observations, patient history, and epidemiological information.  Fact Sheet for Patients:   StrictlyIdeas.no  Fact Sheet for Healthcare Providers: BankingDealers.co.za  This test is not yet approved or  cleared by the [place] FDA and has been authorized for detection and/or diagnosis of SARS-CoV-2 by FDA under an Emergency Use Authorization (EUA).  This EUA will remain in effect (meaning this test can be used) for the  duration of the COVID-19 declaration under Section 564(b)(1) of the Act, 21 U.S.C. section 360bbb-3(b)(1), unless the authorization is terminated or revoked sooner.  Performed at [facility], [address] [place], [place] [address]   CBC with Differential     Status: None   Collection Time: 12/03/19 10:36 AM  Result Value Ref Range   WBC 8.6 4.0 - 10.5 K/uL   RBC 4.76 4.22 - 5.81 MIL/uL   Hemoglobin 14.6 13.0 - 17.0 g/dL   HCT 43.8 39 - 52 %   MCV 92.0 80.0 - 100.0 fL   MCH 30.7 26.0 - 34.0 pg   MCHC 33.3 30.0 - 36.0 g/dL   RDW 14.2 11.5 - 15.5 %   Platelets 213 150 - 400 K/uL   nRBC 0.0 0.0 - 0.2 %   Neutrophils Relative % 66 %   Neutro Abs 5.7 1.7 - 7.7 K/uL   Lymphocytes Relative 20 %   Lymphs Abs 1.7 0.7 - 4.0 K/uL   Monocytes Relative 9 %   Monocytes Absolute 0.8 0 - 1 K/uL   Eosinophils Relative 3 %   Eosinophils Absolute 0.3 0 - 0 K/uL   Basophils Relative 1 %   Basophils Absolute 0.1 0 -  0 K/uL   Immature Granulocytes 1 %   Abs Immature Granulocytes 0.06 0.00 - 0.07 K/uL    Comment: Performed at [facility], [address] [place], [place] [address]  Comprehensive metabolic panel     Status: Abnormal   Collection Time: 12/03/19 10:36 AM  Result Value Ref Range   Sodium 141 135 - 145 mmol/L   Potassium 3.6 3.5 - 5.1 mmol/L   Chloride 104 98 - 111 mmol/L   CO2 26 22 - 32 mmol/L   Glucose, Bld 121 (H) 70 - 99 mg/dL    Comment: Glucose reference range applies only to samples taken after fasting for at least 8 hours.   BUN 14 8 - 23 mg/dL   Creatinine, Ser 0.96 0.61 - 1.24 mg/dL   Calcium 9.7 8.9 - 10.3 mg/dL   Total Protein 6.8 6.5 - 8.1 g/dL   Albumin 3.4 (L) 3.5 - 5.0 g/dL   AST 18 15 - 41 U/L   ALT 12 0 - 44 U/L   Alkaline Phosphatase 32 (L) 38 - 126 U/L   Total Bilirubin 1.5 (H) 0.3 - 1.2 mg/dL   GFR calc non Af Amer >60 >60 mL/min   GFR calc Af Amer >60 >60 mL/min   Anion gap 11 5 - 15    Comment: Performed at [facility], [address] [place], [place] [address]  Troponin I (High Sensitivity)     Status: None   Collection Time: 12/03/19 10:36 AM  Result Value Ref Range   Troponin I (High Sensitivity) 14 <18 ng/L    Comment: (NOTE) Elevated high sensitivity troponin I (hsTnI) values and significant  changes across serial measurements may suggest ACS but many other  chronic and acute conditions are known to elevate hsTnI results.  Refer to the "Links" section for chest pain algorithms and additional  guidance. Performed at [facility], [address] [place], [place] [address]   Urinalysis, Routine w reflex microscopic Urine, Clean Catch     Status: None   Collection Time: 12/03/19 12:20 PM  Result Value Ref Range   Color, Urine YELLOW YELLOW   APPearance CLEAR CLEAR   Specific Gravity, Urine 1.025 1.005 - 1.030   pH 5.5 5.0 - 8.0   Glucose, UA NEGATIVE NEGATIVE mg/dL   Hgb urine dipstick NEGATIVE NEGATIVE   Bilirubin Urine NEGATIVE NEGATIVE   Ketones, ur NEGATIVE NEGATIVE mg/dL   Protein, ur NEGATIVE NEGATIVE mg/dL   Nitrite NEGATIVE NEGATIVE   Leukocytes,Ua NEGATIVE NEGATIVE    Comment: Microscopic not done on urines with negative protein, blood, leukocytes, nitrite, or glucose < 500 mg/dL. Performed at [facility], [address] [place], [place] [address]   Troponin I (High Sensitivity)     Status: None   Collection Time: 12/03/19  1:05 PM  Result Value Ref Range   Troponin I (High Sensitivity) 17 <18 ng/L    Comment: (NOTE) Elevated high sensitivity troponin I (hsTnI) values and significant  changes across serial measurements may suggest ACS but many other  chronic and acute conditions are known to elevate hsTnI results.  Refer to the "Links" section for chest pain algorithms and additional  guidance. Performed at [facility], [address], [place], [place] [address]   Protime-INR     Status: Abnormal   Collection Time: 12/03/19  1:05  PM  Result Value Ref Range   Prothrombin Time 34.2 (H)  11.4 - 15.2 seconds   INR 3.5 (H) 0.8 - 1.2    Comment: (NOTE) INR goal varies based on device and disease states. Performed at [facility], [address] [place], [place] [address]     CT ANGIO HEAD W OR WO CONTRAST  Result Date: 12/03/2019 CLINICAL DATA:  Intracranial hemorrhage. Extra-axial hemorrhage along the right cerebellum and inferior to the tentorium. Subarachnoid hemorrhage EXAM: CT ANGIOGRAPHY HEAD AND NECK TECHNIQUE: Multidetector CT imaging of the head and neck was performed using the standard protocol during bolus administration of intravenous contrast. Multiplanar CT image reconstructions and MIPs were obtained to evaluate the vascular anatomy. Carotid stenosis measurements (when applicable) are obtained utilizing NASCET criteria, using the distal internal carotid diameter as the denominator. CONTRAST:  138mL OMNIPAQUE IOHEXOL 350 MG/ML SOLN COMPARISON:  CT head without contrast 12/03/2019. FINDINGS: CT HEAD FINDINGS CTA NECK FINDINGS Aortic arch: A 3 vessel arch configuration is present. Great vessel origins are within normal limits. Atherosclerotic changes are present in the distal arch. No aneurysm or stenosis is present. No traumatic injury is present. Right carotid system: The right common carotid artery is within normal limits. Bifurcation is unremarkable. Cervical right ICA is normal. Left carotid system: The left common carotid artery is within normal limits. Minimal atherosclerotic changes are noted at the bifurcation without significant stenosis. The cervical left ICA is normal. Vertebral arteries: The right vertebral artery is dominant. Both vertebral arteries originate from the subclavian arteries without significant stenosis. No significant stenosis is present in either vertebral artery in the neck. Skeleton: Degenerative anterolisthesis is present at C4-5. Multilevel degenerative endplate changes are  present the lower cervical and upper thoracic spine. No acute fractures are present. Other neck: Extensive subcutaneous emphysema is noted salivary glands and ducts are within normal limits. No significant adenopathy is present. Multinodular thyroid is present. The largest lesion is in the left lobe measuring 2.5 cm. Upper chest: A large right pneumothorax is present. Right pleural effusion is present. Pneumomediastinum is noted. Anterior right second rib fracture is noted. Review of the MIP images confirms the above findings CTA HEAD FINDINGS Anterior circulation: Atherosclerotic changes are present within the cavernous internal carotid arteries bilaterally without significant stenosis from the skull base through the ICA termini. The A1 and M1 segments are normal. The anterior communicating artery is patent. MCA bifurcations are intact. The ACA and MCA branch vessels are within normal limits. Posterior circulation: The right vertebral artery is dominant vessel. The PICA origins are visualized and normal. Vertebrobasilar junction is normal. Both posterior cerebral arteries originate from the basilar tip. The PCA branch vessels are within normal limits bilaterally. Venous sinuses: The dural sinuses are patent. The straight sinus and deep cerebral veins are intact. Cortical veins are unremarkable. Anatomic variants: None Review of the MIP images confirms the above findings IMPRESSION: 1. Large right pneumothorax with pneumomediastinum and right pleural effusion. 2. Anterior right second rib fracture. 3. Normal CTA Circle of Willis without significant proximal stenosis, aneurysm, or branch vessel occlusion. The extra-axial hemorrhage is likely posttraumatic. 4. Minimal atherosclerotic changes at the left carotid bifurcation and bilateral cavernous internal carotid arteries without significant stenosis. 5. Extensive subcutaneous emphysema. 6. Multinodular thyroid. The largest lesion is in the left lobe measuring 2.5 cm.  Recommend thyroid US (ref: J Am Coll Radiol. 2015 Feb;12(2): 143-50). Electronically Signed   By: San Morelle M.D.   On: 12/03/2019 13:28   CT Head Wo Contrast  Result Date: 12/03/2019 CLINICAL DATA:  Fall  this past Wednesday, minor head trauma EXAM: CT HEAD WITHOUT CONTRAST TECHNIQUE: Contiguous axial images were obtained from the base of the skull through the vertex without intravenous contrast. COMPARISON:  Recent prior head CT 10/25/2019 FINDINGS: Brain: Multifocal intracranial hemorrhage. There is a relatively large subdural hematoma layering along the inferior aspect of the right tentorium cerebellum measuring 2.9 x 2.3 x 3.2 cm (volume = 11 cm^3). Additionally, there are foci subarachnoid hemorrhage layering within the sulci overlying the left cerebral hemisphere, and within the right parietal lobe sulci and left temporal lobe sulci. The subdural hematoma along the right tentorium exerts mass effect on the underlying cerebellum and also effaces the CSF space around the right hemi pons. No evidence of intraventricular hemorrhage. No evidence of intra or entrapment at this time. Stable cortical and central atrophy with ex vacuo ventriculomegaly of the lateral ventricles. No evidence of acute ischemia. Vascular: No hyperdense vessel or unexpected calcification. Skull: Normal. Negative for fracture or focal lesion. Sinuses/Orbits: No acute finding. Other: Extensive subcutaneous emphysema tracking along the muscular and soft tissue planes of the skull base and upper neck. IMPRESSION: 1. Multifocal intracranial hemorrhage most significantly a moderately large acute subdural hematoma along the inferior surface of the right tentorium cerebellum with local mass effect on the superior aspect of the right cerebellar hemisphere and effacement of the CSF spaces adjacent to the right pons. 2. Additional multifocal scattered foci of subarachnoid hemorrhage bilaterally. 3. Extensive subcutaneous emphysema.  Critical Value/emergent results were called by telephone at the time of interpretation on 12/03/2019 at 12:14 pm to provider DAN FLOYD , who verbally acknowledged these results. Electronically Signed   By: Jacqulynn Cadet M.D.   On: 12/03/2019 12:14   CT ANGIO NECK W OR WO CONTRAST  Result Date: 12/03/2019 CLINICAL DATA:  Intracranial hemorrhage. Extra-axial hemorrhage along the right cerebellum and inferior to the tentorium. Subarachnoid hemorrhage EXAM: CT ANGIOGRAPHY HEAD AND NECK TECHNIQUE: Multidetector CT imaging of the head and neck was performed using the standard protocol during bolus administration of intravenous contrast. Multiplanar CT image reconstructions and MIPs were obtained to evaluate the vascular anatomy. Carotid stenosis measurements (when applicable) are obtained utilizing NASCET criteria, using the distal internal carotid diameter as the denominator. CONTRAST:  194mL OMNIPAQUE IOHEXOL 350 MG/ML SOLN COMPARISON:  CT head without contrast 12/03/2019. FINDINGS: CT HEAD FINDINGS CTA NECK FINDINGS Aortic arch: A 3 vessel arch configuration is present. Great vessel origins are within normal limits. Atherosclerotic changes are present in the distal arch. No aneurysm or stenosis is present. No traumatic injury is present. Right carotid system: The right common carotid artery is within normal limits. Bifurcation is unremarkable. Cervical right ICA is normal. Left carotid system: The left common carotid artery is within normal limits. Minimal atherosclerotic changes are noted at the bifurcation without significant stenosis. The cervical left ICA is normal. Vertebral arteries: The right vertebral artery is dominant. Both vertebral arteries originate from the subclavian arteries without significant stenosis. No significant stenosis is present in either vertebral artery in the neck. Skeleton: Degenerative anterolisthesis is present at C4-5. Multilevel degenerative endplate changes are present the  lower cervical and upper thoracic spine. No acute fractures are present. Other neck: Extensive subcutaneous emphysema is noted salivary glands and ducts are within normal limits. No significant adenopathy is present. Multinodular thyroid is present. The largest lesion is in the left lobe measuring 2.5 cm. Upper chest: A large right pneumothorax is present. Right pleural effusion is present. Pneumomediastinum is noted. Anterior right second rib  fracture is noted. Review of the MIP images confirms the above findings CTA HEAD FINDINGS Anterior circulation: Atherosclerotic changes are present within the cavernous internal carotid arteries bilaterally without significant stenosis from the skull base through the ICA termini. The A1 and M1 segments are normal. The anterior communicating artery is patent. MCA bifurcations are intact. The ACA and MCA branch vessels are within normal limits. Posterior circulation: The right vertebral artery is dominant vessel. The PICA origins are visualized and normal. Vertebrobasilar junction is normal. Both posterior cerebral arteries originate from the basilar tip. The PCA branch vessels are within normal limits bilaterally. Venous sinuses: The dural sinuses are patent. The straight sinus and deep cerebral veins are intact. Cortical veins are unremarkable. Anatomic variants: None Review of the MIP images confirms the above findings IMPRESSION: 1. Large right pneumothorax with pneumomediastinum and right pleural effusion. 2. Anterior right second rib fracture. 3. Normal CTA Circle of Willis without significant proximal stenosis, aneurysm, or branch vessel occlusion. The extra-axial hemorrhage is likely posttraumatic. 4. Minimal atherosclerotic changes at the left carotid bifurcation and bilateral cavernous internal carotid arteries without significant stenosis. 5. Extensive subcutaneous emphysema. 6. Multinodular thyroid. The largest lesion is in the left lobe measuring 2.5 cm. Recommend  thyroid US (ref: J Am Coll Radiol. 2015 Feb;12(2): 143-50). Electronically Signed   By: San Morelle M.D.   On: 12/03/2019 13:28   CT CHEST ABDOMEN PELVIS W CONTRAST  Result Date: 12/03/2019 CLINICAL DATA:  Trauma.  Pneumothorax and subcutaneous emphysema. EXAM: CT CHEST, ABDOMEN, AND PELVIS WITH CONTRAST TECHNIQUE: Multidetector CT imaging of the chest, abdomen and pelvis was performed following the standard protocol during bolus administration of intravenous contrast. CONTRAST:  149mL OMNIPAQUE IOHEXOL 350 MG/ML SOLN COMPARISON:  Chest x-ray 12/03/2019 FINDINGS: CT CHEST FINDINGS Cardiovascular: The heart is normal in size. No pericardial effusion. Mild tortuosity and calcification of the thoracic aorta but no aneurysm or dissection. The branch vessels are patent. LAD stent noted. Pacer wires in good position without complicating features. There is mild shift of the heart and mediastinum to the left side due to a large right pneumothorax. Mediastinum/Nodes: No mediastinal or hilar mass or adenopathy. There is extensive pneumomediastinum. The esophagus is grossly normal. There is a complex left thyroid lobe lesion measuring 2.4 cm. Recommend thyroid US (ref: J Am Coll Radiol. 2015 Feb;12(2): 143-50). Lungs/Pleura: There is a large right-sided pneumothorax estimated at 60%. There is mild shift of the heart mediastinum to the left suggesting a tension pneumothorax. Associated significant right lower lobe atelectasis and small to moderate-sized right pleural effusion. The left lung is clear. Musculoskeletal: There is a displaced right second anterior rib fracture and associated adjacent pleural hematoma. This likely causes the pneumothorax. There also nondisplaced fractures involving the third and fourth ribs. No sternal fractures identified. Suspect remote healed upper sternal fracture. The thoracic vertebral bodies are normally aligned. No acute fracture. There are remote healed fractures involving the  left posterior ribs and also a remote fracture of the left humeral head. CT ABDOMEN PELVIS FINDINGS Hepatobiliary: No acute hepatic injury is identified. No perihepatic fluid collections. No hepatic lesions. The gallbladder is normal. No common bile duct dilatation. Pancreas: No mass, inflammation or ductal dilatation. No acute pancreatic injury or peripancreatic fluid collection. Spleen: Normal size. No acute splenic injury or perisplenic fluid collection. Adrenals/Urinary Tract: The adrenal glands and kidneys are unremarkable except for a large simple appearing left renal cyst, right-sided parapelvic renal cysts and a lower pole left renal calculus. No acute renal  injury or perirenal fluid collections. There is a Foley catheter noted in the bladder. There is a large bladder calculus measuring 16 mm. Stomach/Bowel: The stomach, duodenum, small bowel and colon are grossly normal. No acute inflammatory changes, mass lesions or obstructive findings. Vascular/Lymphatic: 4.7 cm infrarenal abdominal aortic aneurysm ending just above the iliac artery bifurcation. No dissection. Moderate atherosclerotic calcifications. Mild fusiform enlargement of both iliac arteries but no focal aneurysm or dissection. The major venous structures are patent. No mesenteric or retroperitoneal mass, adenopathy or hematoma. Reproductive: The prostate gland and seminal vesicles are grossly normal. Other: No pelvic mass or adenopathy. No free pelvic fluid collections. No inguinal mass or adenopathy. No abdominal wall hernia or subcutaneous lesions. Musculoskeletal: Advanced degenerative lumbar spondylosis with multilevel disc disease and facet disease but no acute lumbar spine fracture. The bony pelvis is intact.  No hip or pelvic fractures. IMPRESSION: 1. Large right-sided pneumothorax estimated at 60% with mild shift of the heart and mediastinum to the left side suggesting a tension pneumothorax. 2. Right-sided rib fractures as discussed  above. The displaced right second anterior rib fracture is likely the cause of the pneumothorax. 3. Large amount of pneumomediastinum. 4. No acute abdominal/pelvic findings, mass lesions or adenopathy. No acute abdominal/pelvic injury is identified. 5. 4.7 cm infrarenal abdominal aortic aneurysm. Recommend follow-up every 6 months and vascular consultation. This recommendation follows ACR consensus guidelines: White Paper of the ACR Incidental Findings Committee II on Vascular Findings. J Am Coll Radiol 2013; 10:789-794. 6. 2.4 cm left thyroid lesion. Recommend thyroid US (ref: J Am Coll Radiol. 2015 Feb;12(2): 143-50). 7. Large bladder calculus. These results were called by telephone at the time of interpretation on 12/03/2019 at 1:07 pm to provider DAN FLOYD , who verbally acknowledged these results. Aortic Atherosclerosis (ICD10-I70.0). Electronically Signed   By: Marijo Sanes M.D.   On: 12/03/2019 13:05   DG Chest Portable 1 View  Result Date: 12/03/2019 CLINICAL DATA:  Pneumothorax, post chest tube placement EXAM: PORTABLE CHEST 1 VIEW COMPARISON:  CT 12/03/2019 FINDINGS: Partially coiled right pleural drain projects over the right upper lung. Question a trace residual right apical pneumothorax. Opacity in the right mid to lower lung lung base which could reflect residual atelectasis or some mild re-expansion edema or underlying consolidation. Additional atelectatic changes present in the left lung. Subcutaneous emphysema seen across the right chest wall and base of the neck bilaterally. Small volume pneumomediastinum remains. AICD battery pack projects over the left chest wall with lead in stable position towards the cardiac apex. Coronary stent is noted. Remaining cardiomediastinal contours are unremarkable. Rib fractures are better seen on comparison radiograph. IMPRESSION: 1. Question a trace residual right apical pneumothorax despite the placement of a right chest tube partially coiled in right upper  lung. 2. Opacity in the right mid to lower lung base which could reflect residual atelectasis, some mild re-expansion edema or underlying consolidation. 3. Extensive subcutaneous emphysema across the right chest wall and base of the neck bilaterally. 4. Persistent pneumomediastinum. 5. Multiple right rib fractures better seen on CT. Electronically Signed   By: Lovena Le M.D.   On: 12/03/2019 15:01   DG Chest Portable 1 View  Result Date: 12/03/2019 CLINICAL DATA:  Shortness of breath, urinary retention, laps lung, CHF, ischemic cardiomyopathy, coronary artery disease post MI, hypertension EXAM: PORTABLE CHEST 1 VIEW COMPARISON:  Portable exam 1007 hours compared to 08/11/2019 FINDINGS: LEFT subclavian ICD lead projects over RIGHT ventricle. Normal heart size post coronary stenting. Mediastinal contours and  pulmonary vascularity normal. Asymmetric lucency of the RIGHT hemithorax at least in part due to superimposed chest wall emphysema, which extends into the cervical regions bilaterally. Small loculated pneumothorax identified at medial RIGHT upper lobe. Subsegmental atelectasis at bases. Remaining lungs clear without pulmonary infiltrate or pleural effusion. Bones demineralized. IMPRESSION: Significant RIGHT chest wall emphysema extending into the cervical regions bilaterally. Small loculated pneumothorax at medial RIGHT upper lobe. Critical Value/emergent results were called by telephone at the time of interpretation on 12/03/2019 at 10:33 am to provider DAN FLOYD MD, who verbally acknowledged these results. Electronically Signed   By: Lavonia Dana M.D.   On: 12/03/2019 10:34    ROS  PE Blood pressure 134/69, pulse 73, temperature 99.2 F (37.3 C), temperature source Oral, resp. rate (!) 21, height 5\' 11"  (1.803 m), weight 93 kg, SpO2 98 %. Constitutional: NAD; conversant; no deformities Eyes: Moist conjunctiva; no lid lag; anicteric; PERRL Neck: Trachea midline; no C-spine tenderness Chest: Right  sided chest wall tenderness, subcutaneous emphysema extending to neck. Right pigtail in place, no airleak, with serosanguinous drainage. 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  Injuries:  Right cerebellum subdural Subarhachnoid hemorrhage Large right side PTX with subcutaneous emphysema Right sided 2-4 rib fractures     Neuro:  #Right cerebellum subdural #Subarhachnoid hemorrhage -admit to trauma ICU -repeat CT brain at [place] -received [facility]ra  #Pain -scheduled Tylenol -Oxy PRN  CV:  #CHF, EF 25-30% -telemetry -continue home beta blocker, no evidence of fluid overload at this time -holding Lasix  Resp:  #Large right side PTX with subcutaneous emphysema #Right sided 2-4 rib fractures   -pain control -Right pigtail in place to suction -daily CXR -encourage IS and ambulation  #OSA -continue home CPAP  GI:  -keep NPO for now, until interval CT brain is completed  -continue home PPI   Heme:  #h/o DVT -holding Coumadin -daily INR and CBC -received KCentra (9/12) -holding SQH  PT/OT have been ordered  Daiva Huge 12/03/2019, 5:09 PM

## 2019-12-03 NOTE — ED Provider Notes (Signed)
[facility] EMERGENCY DEPARTMENT Provider Note  CSN: [id number] Arrival date & time: 12/03/19  7096  History Chief Complaint  Patient presents with  . Shortness of Breath  . Urinary Retention   Kevin Hart is a 74 y.o. male.  Patient's main concern today is: Urinary retention- patient has not had adequate urine output for 2 days. Had small amount of concentrated urine output last night and pain with urinating. Positive for suprapubic pain. Denies incontinence. Has the sensation of full bladder and urge to go but cannot initiate. Has had this issue in the distant past and has PMH of BPH and bladder stone but denies medications. On lasix 20mg  and describes good urine output with this medication typically.   SOB/chest pain- patient states that he had chest xray done this past week which showed a possible "collapsed lung" and was scheduled to get a chest CT tomorrow. His shortness of breath has worsened since that last appointment. He does not have any current chest pain and describes it as Right upper chest pain radiating to shoulder/neck area.   Frequent falls- none preceding this presentation. Has had two falls in the past couple weeks including once from stairs onto a concrete floor. Denies LOC. Had head CTs at those times without signs of intracranial bleeding. Patient is on warfarin and ASA. 2.9 INR last check. No change in dose at that time.  Takes 1 full pill 1 day per week and half a pill the other 6 days (5mg  pills).  Update: wife calls in and states that patient had fall 4 days ago and hit his head. He did not get a head CT at that time. She states that he has been dizzy and unable to walk since that fall. They also started him on levaquin at that time for possible pneumonia.    Past Medical History:  Diagnosis Date  . Arthritis   . Bladder stone 10/26/2017  . BPH (benign prostatic hyperplasia)    takes Flomax daily  . CAD (coronary artery disease)    a. prior LAD  stenting;  b. 06/2014 NSTEMI/PCI: LM nl, LAD 30p, 76m ISR (3.0x20 Promus DES), LCX nl, RCA nondom, nl, EF 25-30% ant, apical, dist inf AK.  . Cataracts, bilateral    immature  . Chronic systolic CHF (congestive heart failure) ([place]) 12/31/2014  . DVT (deep venous thrombosis) (H[facility])    RECURRENT left leg  . Dyslipidemia   . GERD (gastroesophageal reflux disease)   . Gout   . History of kidney stones   . Hypertension   . Ischemic cardiomyopathy 07/06/2014  . Lupus anticoagulant positive   . Myocardial infarct ([place]) 2000  . Myocardial infarct ([place]) 2016  . S/P drug eluting coronary stent placement, 07/06/14, Promus 07/07/2014  . Sleep apnea    biPaP  . Subarachnoid bleed ([place]) 2015   after falling and hitting his head   Patient Active Problem List   Diagnosis Date Noted  . SDH (subdural hematoma) ([place]) 10/12/2019  . Hypokalemia   . OSA on CPAP   . Chest pain 08/11/2019  . Precordial chest pain   . Unstable angina ([place]) 07/26/2018  . BPH with urinary obstruction 02/24/2018  . Renal hemorrhage, right 10/26/2017  . S/P left rotator cuff repair 03/05/2016  . Anterior dislocation of left shoulder 11/29/2015  . History of implantable cardioverter-defibrillator (ICD) placement 03/16/2015  . ICD (implantable cardioverter-defibrillator) in place   . Chronic systolic heart failure ([place]) 12/31/2014  . Chronic systolic dysfunction of  left ventricle 11/27/2014  . S/P drug eluting coronary stent placement, mLAD 07/06/14, Promus 07/07/2014  . At risk for sudden cardiac death, EF 25-30% with PVCs, with lifevest 07/07/2014  . Coronary artery disease involving native coronary artery of native heart with unstable angina pectoris ([place])   . Cardiomyopathy, ischemic   . History of DVT (deep vein thrombosis), on coumadin   . NSTEMI (non-ST elevated myocardial infarction) ([place]) 07/05/2014  . CAD (coronary artery disease)   . Recurrent acute deep vein thrombosis (DVT) of lower extremity ([place])   .  Dyslipidemia   . GERD (gastroesophageal reflux disease)   . Anterior myocardial infarction ([place])   . Lupus anticoagulant positive    Past Surgical History:  Procedure Laterality Date  . COLONOSCOPY    . EP IMPLANTABLE DEVICE [name] 11/27/2014   [employer] Fortify Assura VR  implanted by Dr Rayann Heman for primary prevention  . LEFT HEART CATH AND CORONARY ANGIOGRAPHY [name] 07/29/2018   Procedure: LEFT HEART CATH AND CORONARY ANGIOGRAPHY;  Surgeon: Sherren Mocha, MD;  Location: [facility];  Service: Cardiovascular;  Laterality: [name];  . LEFT HEART CATH AND CORONARY ANGIOGRAPHY [name] 08/14/2019   Procedure: LEFT HEART CATH AND CORONARY ANGIOGRAPHY;  Surgeon: Troy Sine, MD;  Location: [facility];  Service: Cardiovascular;  Laterality: [name];  . LEFT HEART CATHETERIZATION WITH CORONARY ANGIOGRAM [name] 07/06/2014   Procedure: LEFT HEART CATHETERIZATION WITH CORONARY ANGIOGRAM;  Surgeon: Peter M Martinique, MD;  Location: [facility] CATH LAB;  Service: Cardiovascular;  Laterality: [name];  . PERCUTANEOUS CORONARY STENT INTERVENTION (PCI-S) Right 07/06/2014   Procedure: PERCUTANEOUS CORONARY STENT INTERVENTION (PCI-S);  Surgeon: Peter M Martinique, MD;  Location: [facility] CATH LAB;  Service: Cardiovascular;  Laterality: Right;  . SHOULDER ARTHROSCOPY WITH SUBACROMIAL DECOMPRESSION Left 03/05/2016   Procedure: SHOULDER ARTHROSCOPY ROTATOR CUFF REPAIR WITH SUBACROMIAL DECOMPRESSION;  Surgeon: Tania Ade, MD;  Location: [facility];  Service: Orthopedics;  Laterality: Left;  SHOULDER ARTHROSCOPY ROTATOR CUFF REPAIR WITH SUBACROMIAL DECOMPRESSION  . SHOULDER CLOSED REDUCTION Left 11/29/2015   Procedure: CLOSED REDUCTION SHOULDER;  Surgeon: Tania Ade, MD;  Location: [facility];  Service: Orthopedics;  Laterality: Left;  . TRANSURETHRAL RESECTION OF PROSTATE [name] 02/24/2018   Procedure: TRANSURETHRAL RESECTION OF THE PROSTATE (TURP);  Surgeon: Irine Seal, MD;  Location: [facility];  Service: Urology;  Laterality: [name];  . WRIST  SURGERY Bilateral    plates and screws    Family History  Problem Relation Age of Onset  . Aneurysm Father    Social History   Tobacco Use  . Smoking status: Never Smoker  . Smokeless tobacco: Never Used  Vaping Use  . Vaping Use: Never used  Substance Use Topics  . Alcohol use: Yes    Alcohol/week: 0.0 standard drinks    Comment: seldom   . Drug use: No   Home Medications Prior to Admission medications   Medication Sig Start Date End Date Taking? Authorizing Provider  allopurinol (ZYLOPRIM) 300 MG tablet Take 300 mg by mouth daily.  02/27/16   [provider]  aspirin 81 MG chewable tablet Chew 1 tablet (81 mg total) by mouth daily. 08/15/19   Dorothy Spark, MD  ezetimibe (ZETIA) 10 MG tablet Take 1 tablet (10 mg total) by mouth daily. 08/15/19   Dorothy Spark, MD  furosemide (LASIX) 20 MG tablet Take 1 tablet (20 mg total) by mouth daily. 11/29/19 02/27/20  Martinique, Peter M, MD  levETIRAcetam (KEPPRA) 500 MG tablet Take 1 tablet (500  mg total) by mouth 2 (two) times daily. 10/13/19   Seawell, Jaimie A, DO  LIVALO 4 MG TABS Take 4 mg by mouth at bedtime.  09/19/19   [provider]  Multiple Vitamin (MULTI-VITAMINS) TABS Take 1 tablet by mouth daily.     [provider]  Multiple Vitamin (ONE-A-DAY MENS PO) Take 1 tablet by mouth daily.    [provider]  nitroGLYCERIN (NITROSTAT) 0.4 MG SL tablet Place 1 tablet (0.4 mg total) under the tongue every 5 (five) minutes x 3 doses as needed for chest pain. 12/08/18   Martinique, Peter M, MD  pantoprazole (PROTONIX) 40 MG tablet Take 40 mg by mouth daily. 09/19/19   [provider]  potassium chloride SA (KLOR-CON) 20 MEQ tablet Take 20 mEq by mouth daily.    [provider]  propranolol (INDERAL) 20 MG tablet Take 20 mg by mouth 2 (two) times daily. 09/19/19   [provider]  sacubitril-valsartan (ENTRESTO) 24-26 MG Take 1 tablet by mouth daily. 11/29/19   Martinique, Peter M, MD    warfarin (COUMADIN) 5 MG tablet Take 0.5-1 tablets (2.5-5 mg total) by mouth See admin instructions. Take 5mg  on 5/25 and 5/26 then resume home regimen of 2.5mg  daily except 5mg  on Wednesday 08/15/19   Dorothy Spark, MD   Allergies    Patient has no known allergies.  Review of Systems   Review of Systems  Physical Exam Updated Vital Signs BP (!) 142/95   Pulse 99   Temp 98.2 F (36.8 C) (Oral)   Resp 18   Ht 5\' 11"  (1.803 m)   Wt 93 kg   SpO2 95%   BMI 28.59 kg/m   Physical Exam Vitals and nursing note reviewed.  Constitutional:      General: He is not in acute distress.    Appearance: He is obese. He is not ill-appearing.  HENT:     Head: Normocephalic and atraumatic.  Eyes:     Pupils: Pupils are equal, round, and reactive to light.  Cardiovascular:     Rate and Rhythm: Regular rhythm. Tachycardia present.     Heart sounds: Murmur heard.   Pulmonary:     Effort: Pulmonary effort is normal. No respiratory distress.     Breath sounds: Examination of the right-upper field reveals decreased breath sounds. Decreased breath sounds present. No wheezing.  Chest:     Chest wall: Deformity and crepitus present.  Abdominal:     Palpations: Abdomen is soft.     Tenderness: There is abdominal tenderness.  Skin:    General: Skin is warm and dry.     Comments: Multiple seborrheic keratosis  Neurological:     Mental Status: He is alert and oriented to person, place, and time.  Psychiatric:        Mood and Affect: Mood normal.    ED Results / Procedures / Treatments   Labs (all labs ordered are listed, but only abnormal results are displayed) Labs Reviewed  COMPREHENSIVE METABOLIC PANEL - Abnormal; Notable for the following components:      Result Value   Glucose, Bld 121 (*)    Albumin 3.4 (*)    Alkaline Phosphatase 32 (*)    Total Bilirubin 1.5 (*)    All other components within normal limits  PROTIME-INR - Abnormal; Notable for the following components:    Prothrombin Time 34.2 (*)    INR 3.5 (*)    All other components within normal limits  SARS  CORONAVIRUS 2 BY RT PCR (HOSPITAL ORDER, [facility])  CBC WITH DIFFERENTIAL/PLATELET  URINALYSIS, ROUTINE W REFLEX MICROSCOPIC  TROPONIN I (HIGH SENSITIVITY)  TROPONIN I (HIGH SENSITIVITY)    EKG EKG Interpretation  Date/Time:  Sunday December 03 2019 09:46:45 EDT Ventricular Rate:  107 PR Interval:    QRS Duration: 87 QT Interval:  357 QTC Calculation: 449 R Axis:   -5 Text Interpretation: Sinus tachycardia Paired ventricular premature complexes Sinus pause Prolonged PR interval Anterior infarct, old Nonspecific T abnormalities, lateral leads Otherwise no significant change Confirmed by Deno Etienne [id number]) on 12/03/2019 10:13:11 AM   Radiology CT ANGIO HEAD W OR WO CONTRAST  Result Date: 12/03/2019 CLINICAL DATA:  Intracranial hemorrhage. Extra-axial hemorrhage along the right cerebellum and inferior to the tentorium. Subarachnoid hemorrhage EXAM: CT ANGIOGRAPHY HEAD AND NECK TECHNIQUE: Multidetector CT imaging of the head and neck was performed using the standard protocol during bolus administration of intravenous contrast. Multiplanar CT image reconstructions and MIPs were obtained to evaluate the vascular anatomy. Carotid stenosis measurements (when applicable) are obtained utilizing NASCET criteria, using the distal internal carotid diameter as the denominator. CONTRAST:  17mL OMNIPAQUE IOHEXOL 350 MG/ML SOLN COMPARISON:  CT head without contrast 12/03/2019. FINDINGS: CT HEAD FINDINGS CTA NECK FINDINGS Aortic arch: A 3 vessel arch configuration is present. Great vessel origins are within normal limits. Atherosclerotic changes are present in the distal arch. No aneurysm or stenosis is present. No traumatic injury is present. Right carotid system: The right common carotid artery is within normal limits. Bifurcation is unremarkable. Cervical right ICA is normal. Left  carotid system: The left common carotid artery is within normal limits. Minimal atherosclerotic changes are noted at the bifurcation without significant stenosis. The cervical left ICA is normal. Vertebral arteries: The right vertebral artery is dominant. Both vertebral arteries originate from the subclavian arteries without significant stenosis. No significant stenosis is present in either vertebral artery in the neck. Skeleton: Degenerative anterolisthesis is present at C4-5. Multilevel degenerative endplate changes are present the lower cervical and upper thoracic spine. No acute fractures are present. Other neck: Extensive subcutaneous emphysema is noted salivary glands and ducts are within normal limits. No significant adenopathy is present. Multinodular thyroid is present. The largest lesion is in the left lobe measuring 2.5 cm. Upper chest: A large right pneumothorax is present. Right pleural effusion is present. Pneumomediastinum is noted. Anterior right second rib fracture is noted. Review of the MIP images confirms the above findings CTA HEAD FINDINGS Anterior circulation: Atherosclerotic changes are present within the cavernous internal carotid arteries bilaterally without significant stenosis from the skull base through the ICA termini. The A1 and M1 segments are normal. The anterior communicating artery is patent. MCA bifurcations are intact. The ACA and MCA branch vessels are within normal limits. Posterior circulation: The right vertebral artery is dominant vessel. The PICA origins are visualized and normal. Vertebrobasilar junction is normal. Both posterior cerebral arteries originate from the basilar tip. The PCA branch vessels are within normal limits bilaterally. Venous sinuses: The dural sinuses are patent. The straight sinus and deep cerebral veins are intact. Cortical veins are unremarkable. Anatomic variants: None Review of the MIP images confirms the above findings IMPRESSION: 1. Large right  pneumothorax with pneumomediastinum and right pleural effusion. 2. Anterior right second rib fracture. 3. Normal CTA Circle of Willis without significant proximal stenosis, aneurysm, or branch vessel occlusion. The extra-axial hemorrhage is likely posttraumatic. 4. Minimal atherosclerotic changes at the left carotid bifurcation and  bilateral cavernous internal carotid arteries without significant stenosis. 5. Extensive subcutaneous emphysema. 6. Multinodular thyroid. The largest lesion is in the left lobe measuring 2.5 cm. Recommend thyroid US (ref: J Am Coll Radiol. 2015 Feb;12(2): 143-50). Electronically Signed   By: San Morelle M.D.   On: 12/03/2019 13:28   CT Head Wo Contrast  Result Date: 12/03/2019 CLINICAL DATA:  Fall this past Wednesday, minor head trauma EXAM: CT HEAD WITHOUT CONTRAST TECHNIQUE: Contiguous axial images were obtained from the base of the skull through the vertex without intravenous contrast. COMPARISON:  Recent prior head CT 10/25/2019 FINDINGS: Brain: Multifocal intracranial hemorrhage. There is a relatively large subdural hematoma layering along the inferior aspect of the right tentorium cerebellum measuring 2.9 x 2.3 x 3.2 cm (volume = 11 cm^3). Additionally, there are foci subarachnoid hemorrhage layering within the sulci overlying the left cerebral hemisphere, and within the right parietal lobe sulci and left temporal lobe sulci. The subdural hematoma along the right tentorium exerts mass effect on the underlying cerebellum and also effaces the CSF space around the right hemi pons. No evidence of intraventricular hemorrhage. No evidence of intra or entrapment at this time. Stable cortical and central atrophy with ex vacuo ventriculomegaly of the lateral ventricles. No evidence of acute ischemia. Vascular: No hyperdense vessel or unexpected calcification. Skull: Normal. Negative for fracture or focal lesion. Sinuses/Orbits: No acute finding. Other: Extensive subcutaneous  emphysema tracking along the muscular and soft tissue planes of the skull base and upper neck. IMPRESSION: 1. Multifocal intracranial hemorrhage most significantly a moderately large acute subdural hematoma along the inferior surface of the right tentorium cerebellum with local mass effect on the superior aspect of the right cerebellar hemisphere and effacement of the CSF spaces adjacent to the right pons. 2. Additional multifocal scattered foci of subarachnoid hemorrhage bilaterally. 3. Extensive subcutaneous emphysema. Critical Value/emergent results were called by telephone at the time of interpretation on 12/03/2019 at 12:14 pm to provider DAN FLOYD , who verbally acknowledged these results. Electronically Signed   By: Jacqulynn Cadet M.D.   On: 12/03/2019 12:14   CT ANGIO NECK W OR WO CONTRAST  Result Date: 12/03/2019 CLINICAL DATA:  Intracranial hemorrhage. Extra-axial hemorrhage along the right cerebellum and inferior to the tentorium. Subarachnoid hemorrhage EXAM: CT ANGIOGRAPHY HEAD AND NECK TECHNIQUE: Multidetector CT imaging of the head and neck was performed using the standard protocol during bolus administration of intravenous contrast. Multiplanar CT image reconstructions and MIPs were obtained to evaluate the vascular anatomy. Carotid stenosis measurements (when applicable) are obtained utilizing NASCET criteria, using the distal internal carotid diameter as the denominator. CONTRAST:  130mL OMNIPAQUE IOHEXOL 350 MG/ML SOLN COMPARISON:  CT head without contrast 12/03/2019. FINDINGS: CT HEAD FINDINGS CTA NECK FINDINGS Aortic arch: A 3 vessel arch configuration is present. Great vessel origins are within normal limits. Atherosclerotic changes are present in the distal arch. No aneurysm or stenosis is present. No traumatic injury is present. Right carotid system: The right common carotid artery is within normal limits. Bifurcation is unremarkable. Cervical right ICA is normal. Left carotid system:  The left common carotid artery is within normal limits. Minimal atherosclerotic changes are noted at the bifurcation without significant stenosis. The cervical left ICA is normal. Vertebral arteries: The right vertebral artery is dominant. Both vertebral arteries originate from the subclavian arteries without significant stenosis. No significant stenosis is present in either vertebral artery in the neck. Skeleton: Degenerative anterolisthesis is present at C4-5. Multilevel degenerative endplate changes are present the lower  cervical and upper thoracic spine. No acute fractures are present. Other neck: Extensive subcutaneous emphysema is noted salivary glands and ducts are within normal limits. No significant adenopathy is present. Multinodular thyroid is present. The largest lesion is in the left lobe measuring 2.5 cm. Upper chest: A large right pneumothorax is present. Right pleural effusion is present. Pneumomediastinum is noted. Anterior right second rib fracture is noted. Review of the MIP images confirms the above findings CTA HEAD FINDINGS Anterior circulation: Atherosclerotic changes are present within the cavernous internal carotid arteries bilaterally without significant stenosis from the skull base through the ICA termini. The A1 and M1 segments are normal. The anterior communicating artery is patent. MCA bifurcations are intact. The ACA and MCA branch vessels are within normal limits. Posterior circulation: The right vertebral artery is dominant vessel. The PICA origins are visualized and normal. Vertebrobasilar junction is normal. Both posterior cerebral arteries originate from the basilar tip. The PCA branch vessels are within normal limits bilaterally. Venous sinuses: The dural sinuses are patent. The straight sinus and deep cerebral veins are intact. Cortical veins are unremarkable. Anatomic variants: None Review of the MIP images confirms the above findings IMPRESSION: 1. Large right pneumothorax  with pneumomediastinum and right pleural effusion. 2. Anterior right second rib fracture. 3. Normal CTA Circle of Willis without significant proximal stenosis, aneurysm, or branch vessel occlusion. The extra-axial hemorrhage is likely posttraumatic. 4. Minimal atherosclerotic changes at the left carotid bifurcation and bilateral cavernous internal carotid arteries without significant stenosis. 5. Extensive subcutaneous emphysema. 6. Multinodular thyroid. The largest lesion is in the left lobe measuring 2.5 cm. Recommend thyroid US (ref: J Am Coll Radiol. 2015 Feb;12(2): 143-50). Electronically Signed   By: San Morelle M.D.   On: 12/03/2019 13:28   CT CHEST ABDOMEN PELVIS W CONTRAST  Result Date: 12/03/2019 CLINICAL DATA:  Trauma.  Pneumothorax and subcutaneous emphysema. EXAM: CT CHEST, ABDOMEN, AND PELVIS WITH CONTRAST TECHNIQUE: Multidetector CT imaging of the chest, abdomen and pelvis was performed following the standard protocol during bolus administration of intravenous contrast. CONTRAST:  163mL OMNIPAQUE IOHEXOL 350 MG/ML SOLN COMPARISON:  Chest x-ray 12/03/2019 FINDINGS: CT CHEST FINDINGS Cardiovascular: The heart is normal in size. No pericardial effusion. Mild tortuosity and calcification of the thoracic aorta but no aneurysm or dissection. The branch vessels are patent. LAD stent noted. Pacer wires in good position without complicating features. There is mild shift of the heart and mediastinum to the left side due to a large right pneumothorax. Mediastinum/Nodes: No mediastinal or hilar mass or adenopathy. There is extensive pneumomediastinum. The esophagus is grossly normal. There is a complex left thyroid lobe lesion measuring 2.4 cm. Recommend thyroid US (ref: J Am Coll Radiol. 2015 Feb;12(2): 143-50). Lungs/Pleura: There is a large right-sided pneumothorax estimated at 60%. There is mild shift of the heart mediastinum to the left suggesting a tension pneumothorax. Associated significant  right lower lobe atelectasis and small to moderate-sized right pleural effusion. The left lung is clear. Musculoskeletal: There is a displaced right second anterior rib fracture and associated adjacent pleural hematoma. This likely causes the pneumothorax. There also nondisplaced fractures involving the third and fourth ribs. No sternal fractures identified. Suspect remote healed upper sternal fracture. The thoracic vertebral bodies are normally aligned. No acute fracture. There are remote healed fractures involving the left posterior ribs and also a remote fracture of the left humeral head. CT ABDOMEN PELVIS FINDINGS Hepatobiliary: No acute hepatic injury is identified. No perihepatic fluid collections. No hepatic lesions. The gallbladder  is normal. No common bile duct dilatation. Pancreas: No mass, inflammation or ductal dilatation. No acute pancreatic injury or peripancreatic fluid collection. Spleen: Normal size. No acute splenic injury or perisplenic fluid collection. Adrenals/Urinary Tract: The adrenal glands and kidneys are unremarkable except for a large simple appearing left renal cyst, right-sided parapelvic renal cysts and a lower pole left renal calculus. No acute renal injury or perirenal fluid collections. There is a Foley catheter noted in the bladder. There is a large bladder calculus measuring 16 mm. Stomach/Bowel: The stomach, duodenum, small bowel and colon are grossly normal. No acute inflammatory changes, mass lesions or obstructive findings. Vascular/Lymphatic: 4.7 cm infrarenal abdominal aortic aneurysm ending just above the iliac artery bifurcation. No dissection. Moderate atherosclerotic calcifications. Mild fusiform enlargement of both iliac arteries but no focal aneurysm or dissection. The major venous structures are patent. No mesenteric or retroperitoneal mass, adenopathy or hematoma. Reproductive: The prostate gland and seminal vesicles are grossly normal. Other: No pelvic mass or  adenopathy. No free pelvic fluid collections. No inguinal mass or adenopathy. No abdominal wall hernia or subcutaneous lesions. Musculoskeletal: Advanced degenerative lumbar spondylosis with multilevel disc disease and facet disease but no acute lumbar spine fracture. The bony pelvis is intact.  No hip or pelvic fractures. IMPRESSION: 1. Large right-sided pneumothorax estimated at 60% with mild shift of the heart and mediastinum to the left side suggesting a tension pneumothorax. 2. Right-sided rib fractures as discussed above. The displaced right second anterior rib fracture is likely the cause of the pneumothorax. 3. Large amount of pneumomediastinum. 4. No acute abdominal/pelvic findings, mass lesions or adenopathy. No acute abdominal/pelvic injury is identified. 5. 4.7 cm infrarenal abdominal aortic aneurysm. Recommend follow-up every 6 months and vascular consultation. This recommendation follows ACR consensus guidelines: White Paper of the ACR Incidental Findings Committee II on Vascular Findings. J Am Coll Radiol 2013; 10:789-794. 6. 2.4 cm left thyroid lesion. Recommend thyroid US (ref: J Am Coll Radiol. 2015 Feb;12(2): 143-50). 7. Large bladder calculus. These results were called by telephone at the time of interpretation on 12/03/2019 at 1:07 pm to provider DAN FLOYD , who verbally acknowledged these results. Aortic Atherosclerosis (ICD10-I70.0). Electronically Signed   By: Marijo Sanes M.D.   On: 12/03/2019 13:05   DG Chest Portable 1 View  Result Date: 12/03/2019 CLINICAL DATA:  Pneumothorax, post chest tube placement EXAM: PORTABLE CHEST 1 VIEW COMPARISON:  CT 12/03/2019 FINDINGS: Partially coiled right pleural drain projects over the right upper lung. Question a trace residual right apical pneumothorax. Opacity in the right mid to lower lung lung base which could reflect residual atelectasis or some mild re-expansion edema or underlying consolidation. Additional atelectatic changes present in the  left lung. Subcutaneous emphysema seen across the right chest wall and base of the neck bilaterally. Small volume pneumomediastinum remains. AICD battery pack projects over the left chest wall with lead in stable position towards the cardiac apex. Coronary stent is noted. Remaining cardiomediastinal contours are unremarkable. Rib fractures are better seen on comparison radiograph. IMPRESSION: 1. Question a trace residual right apical pneumothorax despite the placement of a right chest tube partially coiled in right upper lung. 2. Opacity in the right mid to lower lung base which could reflect residual atelectasis, some mild re-expansion edema or underlying consolidation. 3. Extensive subcutaneous emphysema across the right chest wall and base of the neck bilaterally. 4. Persistent pneumomediastinum. 5. Multiple right rib fractures better seen on CT. Electronically Signed   By: Elwin Sleight.D.  On: 12/03/2019 15:01   DG Chest Portable 1 View  Result Date: 12/03/2019 CLINICAL DATA:  Shortness of breath, urinary retention, laps lung, CHF, ischemic cardiomyopathy, coronary artery disease post MI, hypertension EXAM: PORTABLE CHEST 1 VIEW COMPARISON:  Portable exam 1007 hours compared to 08/11/2019 FINDINGS: LEFT subclavian ICD lead projects over RIGHT ventricle. Normal heart size post coronary stenting. Mediastinal contours and pulmonary vascularity normal. Asymmetric lucency of the RIGHT hemithorax at least in part due to superimposed chest wall emphysema, which extends into the cervical regions bilaterally. Small loculated pneumothorax identified at medial RIGHT upper lobe. Subsegmental atelectasis at bases. Remaining lungs clear without pulmonary infiltrate or pleural effusion. Bones demineralized. IMPRESSION: Significant RIGHT chest wall emphysema extending into the cervical regions bilaterally. Small loculated pneumothorax at medial RIGHT upper lobe. Critical Value/emergent results were called by telephone at  the time of interpretation on 12/03/2019 at 10:33 am to provider DAN FLOYD MD, who verbally acknowledged these results. Electronically Signed   By: Lavonia Dana M.D.   On: 12/03/2019 10:34    Procedures Procedures (including critical care time)  Medications Ordered in ED Medications  phytonadione (VITAMIN K) 10 MG/ML SQ injection (has no administration in time range)  prothrombin complex conc human (KCENTRA) injection (has no administration in time range)  0.9 %  sodium chloride infusion ( Intravenous New Bag/Given 12/03/19 1405)  fentaNYL (SUBLIMAZE) injection 75 mcg (has no administration in time range)  0.9 %  sodium chloride infusion ( Intravenous New Bag/Given 12/03/19 1433)  iohexol (OMNIPAQUE) 350 MG/ML injection 100 mL (100 mLs Intravenous Contrast Given 12/03/19 1143)  phytonadione (VITAMIN K) 10 mg in dextrose 5 % 50 mL IVPB (10 mg Intravenous New Bag/Given 12/03/19 1435)  lidocaine-EPINEPHrine (XYLOCAINE W/EPI) 2 %-1:200000 (PF) injection 10 mL (10 mLs Intradermal Given 12/03/19 1430)  prothrombin complex conc human (KCENTRA) IVPB 1,598 Units (0 Units Intravenous Stopped 12/03/19 1447)    ED Course  I have reviewed the triage vital signs and the nursing notes.  Pertinent labs & imaging results that were available during my care of the patient were reviewed by me and considered in my medical decision making (see chart for details).    MDM Rules/Calculators/A&P                         Alert and oriented male in no acute distress and speaking in full sentences- presenting with main complaint of 2 days of urinary retention. Bladder scan revealed >1L volume. Foley catheter was placed with ~1600cc output. Additionally, patient endorses chest pain and shortness of breath that had initial workup outpatient from PCP includeding a chest xray with concerns for "collapsed lung". Was prescribed Levaquin and had f/u chest CT scheduled for this week.  Wife calls to give additional information that  patient had fall 5 days ago and has not been able to walk, been dizzy and confused since then. Today, he has chest wall and neck crepitus and decreased R lung sounds on exam. Repeat Chest xray consistent with significant chest wall emphysema and small loculated pneumothorax on right. Stat chest/abdomen/pelvis/head CT were ordered. Head CT significant for acute subdural hematoma on cerebellum with mass effect and multiple subarachnoid hemorrhages.  Patient is feeling much improved since foley catheter placed and remains stable on room air. Wife, who is a retired Banker, has been updated on condition and is bedside with patient. Continuing to follow for further results to guide consults/management.   1214- radiology notified attending of  subdural hematoma on cerebellum with multiple subarachnoid hemorrhages bilaterally.  [id number]- radiology notified attending of large right sided tension pneumothorax 2/2 displaced rib fracture seen on chest CT. Patient remains stable ORA and his anticoagulation is being reversed and checking current PT/INR.   1328- I have spoken with trauma surgery and neurosurgery consults who have both agreed to see patient with trauma surgery admitting at [facility]. Patient is stable ORA currently and being prepped for chest tube placement. We are holding warfarin, ASA and administering vitamin K and prothrombin. He will be transferred to [place].   Chest tube placed successfully. Patient continues to be stable. Care signed out to oncoming shift until he is transferred to [facility].   Final Clinical Impression(s) / ED Diagnoses Final diagnoses:  Tension pneumothorax  Subdural hematoma [facility])  Urinary retention   Rx / DC Orders ED Discharge Orders    None       Richarda Osmond, DO 12/03/19 [place], DO 12/03/19 1523

## 2019-12-03 NOTE — ED Notes (Signed)
Pt transported to CT 1 with RN

## 2019-12-03 NOTE — ED Provider Notes (Signed)
CHEST TUBE INSERTION  Date/Time: 12/03/2019 3:16 PM Performed by: Deno Etienne, DO Authorized by: Deno Etienne, DO   Consent:    Consent obtained:  Verbal   Consent given by:  Patient and parent   Risks discussed:  Bleeding, incomplete drainage and infection   Alternatives discussed:  No treatment, delayed treatment and alternative treatment Pre-procedure details:    Skin preparation:  ChloraPrep   Preparation: Patient was prepped and draped in the usual sterile fashion   Anesthesia (see MAR for exact dosages):    Anesthesia method:  Local infiltration   Local anesthetic:  Lidocaine 2% WITH epi Procedure details:    Placement location:  R lateral   Scalpel size:  11   Tube size (Fr):  8   Ultrasound guidance: no     Tension pneumothorax: yes     Tube connected to:  Suction   Drainage characteristics:  Bloody   Suture material:  0 silk   Dressing:  Xeroform gauze and 4x4 sterile gauze Post-procedure details:    Post-insertion x-ray findings: tube in good position     Patient tolerance of procedure:  Tolerated well, no immediate complications   I have personally seen and examined the patient.  I have discussed the plan of care with the resident.  I have reviewed the documentation on PMH/FH/Soc. History.  I have reviewed the documentation of the resident and agree.   EKG Interpretation  Date/Time:  Sunday December 03 2019 09:46:45 EDT Ventricular Rate:  107 PR Interval:    QRS Duration: 87 QT Interval:  357 QTC Calculation: 449 R Axis:   -5 Text Interpretation: Sinus tachycardia Paired ventricular premature complexes Sinus pause Prolonged PR interval Anterior infarct, old Nonspecific T abnormalities, lateral leads Otherwise no significant change Confirmed by Nelli Swalley ([id number]) on 12/03/2019 10:13:11 AM       74  yo M with a chief complaints of difficulty breathing and urinary retention.  Patient had a fall about 4 or 5 days ago and after that sustained some right-sided chest  wall pain.  He had been having some worsening difficulty breathing and now when he tries to get up to walk feels like his legs are too weak to carry his weight.  His wife has been able to get around the house but unfortunately things have gotten a bit worse for her he is here to become confused.  On my exam the patient is globally weak.  Has chronic shoulder pain bilaterally and some difficulty to assess upper extremity coordination.  His CT scan of the head is concerning for a subarachnoid and subdural hematoma.  The initial chest x-ray was read as a possible apical pneumothorax but the CT scan was were concerning for a quite large pneumothorax with some shift.  Clinically the patient had no significant respiratory distress had no hypotension no JVD.  He is on Coumadin and so was acutely reversed with Kcentra.  Discussed with neurosurgery and trauma.  We will send the patient to ED to ED for trauma eval.  CRITICAL CARE Performed by: Cecilio Asper   Total critical care time: 80 minutes  Critical care time was exclusive of separately billable procedures and treating other patients.  Critical care was necessary to treat or prevent imminent or life-threatening deterioration.  Critical care was time spent personally by me on the following activities: development of treatment plan with patient and/or surrogate as well as nursing, discussions with consultants, evaluation of patient's response to treatment, examination of patient, obtaining history  from patient or surrogate, ordering and performing treatments and interventions, ordering and review of laboratory studies, ordering and review of radiographic studies, pulse oximetry and re-evaluation of patient's condition.    Deno Etienne, DO 12/03/19 1519

## 2019-12-03 NOTE — ED Notes (Signed)
Admitting at bedside 

## 2019-12-03 NOTE — ED Notes (Signed)
Orders for STAT INR 60 min after administration of Kcentra. After reviewing pt's medical record no repeat INR preformed prior to pts arrival. Kcentra started at 1435 with no end time available will collect PT-INR at this time

## 2019-12-03 NOTE — ED Notes (Signed)
Patient transported to Radiology 

## 2019-12-03 NOTE — ED Notes (Signed)
Portable Xray at bedside.

## 2019-12-04 ENCOUNTER — Inpatient Hospital Stay (HOSPITAL_COMMUNITY): Payer: PPO

## 2019-12-04 DIAGNOSIS — I361 Nonrheumatic tricuspid (valve) insufficiency: Secondary | ICD-10-CM

## 2019-12-04 DIAGNOSIS — I351 Nonrheumatic aortic (valve) insufficiency: Secondary | ICD-10-CM

## 2019-12-04 DIAGNOSIS — J930 Spontaneous tension pneumothorax: Secondary | ICD-10-CM

## 2019-12-04 LAB — ECHOCARDIOGRAM COMPLETE
AR max vel: 2.06 cm2
AV Area VTI: 1.91 cm2
AV Area mean vel: 1.95 cm2
AV Mean grad: 3 mmHg
AV Peak grad: 5 mmHg
Ao pk vel: 1.12 m/s
Area-P 1/2: 2.73 cm2
Height: 70 in
P 1/2 time: 354 msec
S' Lateral: 3.9 cm
Weight: 3280 oz

## 2019-12-04 LAB — CBC
HCT: 42.3 % (ref 39.0–52.0)
Hemoglobin: 14 g/dL (ref 13.0–17.0)
MCH: 30.3 pg (ref 26.0–34.0)
MCHC: 33.1 g/dL (ref 30.0–36.0)
MCV: 91.6 fL (ref 80.0–100.0)
Platelets: 198 10*3/uL (ref 150–400)
RBC: 4.62 MIL/uL (ref 4.22–5.81)
RDW: 14.3 % (ref 11.5–15.5)
WBC: 8.3 10*3/uL (ref 4.0–10.5)
nRBC: 0 % (ref 0.0–0.2)

## 2019-12-04 LAB — BASIC METABOLIC PANEL
Anion gap: 9 (ref 5–15)
BUN: 11 mg/dL (ref 8–23)
CO2: 28 mmol/L (ref 22–32)
Calcium: 9.8 mg/dL (ref 8.9–10.3)
Chloride: 105 mmol/L (ref 98–111)
Creatinine, Ser: 1.07 mg/dL (ref 0.61–1.24)
GFR calc Af Amer: >60 mL/min (ref >60)
GFR calc non Af Amer: >60 mL/min (ref >60)
Glucose, Bld: 112 mg/dL — ABNORMAL HIGH (ref 70–99)
Potassium: 3.8 mmol/L (ref 3.5–5.1)
Sodium: 142 mmol/L (ref 135–145)

## 2019-12-04 LAB — MRSA PCR SCREENING: MRSA by PCR: NEGATIVE

## 2019-12-04 LAB — PROTIME-INR
INR: 1.2 (ref 0.8–1.2)
Prothrombin Time: 15.1 s (ref 11.4–15.2)

## 2019-12-04 MED ORDER — OXYCODONE HCL 5 MG PO TABS
5.0000 mg | ORAL_TABLET | ORAL | Status: DC | PRN
  Administered 2019-12-04: 5 mg via ORAL
  Filled 2019-12-04: qty 1

## 2019-12-04 MED ORDER — HYDRALAZINE HCL 10 MG PO TABS: 10.0000 mg | ORAL_TABLET | Freq: Four times a day (QID) | ORAL | Status: DC | PRN

## 2019-12-04 MED ORDER — PERFLUTREN LIPID MICROSPHERE
1.0000 mL | INTRAVENOUS | Status: AC | PRN
  Administered 2019-12-04: 4 mL via INTRAVENOUS
  Filled 2019-12-04: qty 10

## 2019-12-04 MED ORDER — FUROSEMIDE 40 MG PO TABS
20.0000 mg | ORAL_TABLET | Freq: Every day | ORAL | Status: DC
  Administered 2019-12-04: 20 mg via ORAL
  Filled 2019-12-04: qty 1

## 2019-12-04 MED ORDER — MORPHINE SULFATE (PF) 2 MG/ML IV SOLN: 2.0000 mg | INTRAVENOUS | Status: DC | PRN

## 2019-12-04 MED ORDER — BETHANECHOL CHLORIDE 10 MG PO TABS
10.0000 mg | ORAL_TABLET | Freq: Three times a day (TID) | ORAL | Status: DC
  Administered 2019-12-04 (×3): 10 mg via ORAL
  Filled 2019-12-04 (×4): qty 1

## 2019-12-04 MED ORDER — POLYETHYLENE GLYCOL 3350 17 G PO PACK
17.0000 g | PACK | Freq: Every day | ORAL | Status: DC
  Administered 2019-12-04 – 2019-12-05 (×2): 17 g via ORAL
  Filled 2019-12-04 (×2): qty 1

## 2019-12-04 NOTE — Progress Notes (Addendum)
Subjective: CC: Patient reports mild right sided ha. No visual changes, dizziness, n/v. No neck pain, back pain, chest pain, sob, abdominal pain or extremity pain. Had foley placed for retention. Has not gotten out of bed.   Patient lives at home with his wife in a 2 story home. They have a bedroom on the first floor. He is retired, previously in [employer]. He denies alcohol or illicit drug use.   Objective: Vital signs in last 24 hours: Temp:  [97.8 F (36.6 C)-99.2 F (37.3 C)] 98.7 F (37.1 C) (09/13 0345) Pulse Rate:  [59-105] 87 (09/13 0800) Resp:  [8-27] 19 (09/13 0800) BP: (94-174)/(45-109) 131/78 (09/13 0800) SpO2:  [90 %-100 %] 90 % (09/13 0800) Weight:  [93 kg] 93 kg (09/12 0931) Last BM Date:  (PTA)  Intake/Output from previous day: 09/12 0701 - 09/13 0700 In: 671.1 [I.V.:470.8; IV Piggyback:200.3] Out: 2725 [Urine:2475; Chest Tube:250] Intake/Output this shift: Total I/O In: 60 [I.V.:60] Out: 125 [Urine:125]  PE: Gen:  Alert, NAD, pleasant HEENT: PERRL. EOM's intact, pupils equal and round Card:  RRR, no M/G/R heard Pulm:  CTAB, no W/R/R, effort normal. R chest tube in place on -20. No air leak. 250 blood output since placement. Dressing c/d/i Abd: Soft, NT/ND, +BS Ext:  Notes chronic left rotator cuff injury that causes decreased rom. No pain with movement. No LE edema or tenderness BUE/BLE  Psych: A&Ox3  Skin: no rashes noted, warm and dry   Lab Results:  Recent Labs    12/03/19 1036 12/04/19 0205  WBC 8.6 8.3  HGB 14.6 14.0  HCT 43.8 42.3  PLT 213 198   BMET Recent Labs    12/03/19 1036 12/04/19 0205  NA 141 142  K 3.6 3.8  CL 104 105  CO2 26 28  GLUCOSE 121* 112*  BUN 14 11  CREATININE 0.96 1.07  CALCIUM 9.7 9.8   PT/INR Recent Labs    12/03/19 1710 12/04/19 0205  LABPROT 16.0* 15.1  INR 1.3* 1.2   CMP     Component Value Date/Time   NA 142 12/04/2019 0205   NA 138 11/09/2017 1618   K 3.8 12/04/2019 0205   CL 105  12/04/2019 0205   CO2 28 12/04/2019 0205   GLUCOSE 112 (H) 12/04/2019 0205   BUN 11 12/04/2019 0205   BUN 21 11/09/2017 1618   CREATININE 1.07 12/04/2019 0205   CREATININE 1.09 10/17/2015 1154   CALCIUM 9.8 12/04/2019 0205   PROT 6.8 12/03/2019 1036   PROT 7.0 09/14/2018 0839   ALBUMIN 3.4 (L) 12/03/2019 1036   ALBUMIN 4.6 09/14/2018 0839   AST 18 12/03/2019 1036   ALT 12 12/03/2019 1036   ALKPHOS 32 (L) 12/03/2019 1036   BILITOT 1.5 (H) 12/03/2019 1036   BILITOT 0.6 09/14/2018 0839   GFRNONAA >60 12/04/2019 0205   GFRAA >60 12/04/2019 0205   Lipase     Component Value Date/Time   LIPASE 40 10/25/2017 2349       Studies/Results: CT ANGIO HEAD W OR WO CONTRAST  Result Date: 12/03/2019 CLINICAL DATA:  Intracranial hemorrhage. Extra-axial hemorrhage along the right cerebellum and inferior to the tentorium. Subarachnoid hemorrhage EXAM: CT ANGIOGRAPHY HEAD AND NECK TECHNIQUE: Multidetector CT imaging of the head and neck was performed using the standard protocol during bolus administration of intravenous contrast. Multiplanar CT image reconstructions and MIPs were obtained to evaluate the vascular anatomy. Carotid stenosis measurements (when applicable) are obtained utilizing NASCET criteria, using the distal  internal carotid diameter as the denominator. CONTRAST:  136mL OMNIPAQUE IOHEXOL 350 MG/ML SOLN COMPARISON:  CT head without contrast 12/03/2019. FINDINGS: CT HEAD FINDINGS CTA NECK FINDINGS Aortic arch: A 3 vessel arch configuration is present. Great vessel origins are within normal limits. Atherosclerotic changes are present in the distal arch. No aneurysm or stenosis is present. No traumatic injury is present. Right carotid system: The right common carotid artery is within normal limits. Bifurcation is unremarkable. Cervical right ICA is normal. Left carotid system: The left common carotid artery is within normal limits. Minimal atherosclerotic changes are noted at the bifurcation  without significant stenosis. The cervical left ICA is normal. Vertebral arteries: The right vertebral artery is dominant. Both vertebral arteries originate from the subclavian arteries without significant stenosis. No significant stenosis is present in either vertebral artery in the neck. Skeleton: Degenerative anterolisthesis is present at C4-5. Multilevel degenerative endplate changes are present the lower cervical and upper thoracic spine. No acute fractures are present. Other neck: Extensive subcutaneous emphysema is noted salivary glands and ducts are within normal limits. No significant adenopathy is present. Multinodular thyroid is present. The largest lesion is in the left lobe measuring 2.5 cm. Upper chest: A large right pneumothorax is present. Right pleural effusion is present. Pneumomediastinum is noted. Anterior right second rib fracture is noted. Review of the MIP images confirms the above findings CTA HEAD FINDINGS Anterior circulation: Atherosclerotic changes are present within the cavernous internal carotid arteries bilaterally without significant stenosis from the skull base through the ICA termini. The A1 and M1 segments are normal. The anterior communicating artery is patent. MCA bifurcations are intact. The ACA and MCA branch vessels are within normal limits. Posterior circulation: The right vertebral artery is dominant vessel. The PICA origins are visualized and normal. Vertebrobasilar junction is normal. Both posterior cerebral arteries originate from the basilar tip. The PCA branch vessels are within normal limits bilaterally. Venous sinuses: The dural sinuses are patent. The straight sinus and deep cerebral veins are intact. Cortical veins are unremarkable. Anatomic variants: None Review of the MIP images confirms the above findings IMPRESSION: 1. Large right pneumothorax with pneumomediastinum and right pleural effusion. 2. Anterior right second rib fracture. 3. Normal CTA Circle of Willis  without significant proximal stenosis, aneurysm, or branch vessel occlusion. The extra-axial hemorrhage is likely posttraumatic. 4. Minimal atherosclerotic changes at the left carotid bifurcation and bilateral cavernous internal carotid arteries without significant stenosis. 5. Extensive subcutaneous emphysema. 6. Multinodular thyroid. The largest lesion is in the left lobe measuring 2.5 cm. Recommend thyroid US (ref: J Am Coll Radiol. 2015 Feb;12(2): 143-50). Electronically Signed   By: San Morelle M.D.   On: 12/03/2019 13:28   CT HEAD WO CONTRAST  Result Date: 12/04/2019 CLINICAL DATA:  Follow-up subarachnoid hemorrhage EXAM: CT HEAD WITHOUT CONTRAST TECHNIQUE: Contiguous axial images were obtained from the base of the skull through the vertex without intravenous contrast. COMPARISON:  CT brain 12/03/2019, 10/25/2019 FINDINGS: Brain: No intracranial mass or territorial infarct is visualized. Moderate atrophy. Stable ventricle size. Mild hypodensity in the white matter consistent with chronic small vessel ischemic change. Grossly stable extra-axial hematoma along the right tentorium measuring approximately 2.9 x 2.1 by 3.3 cm with localized mass effect. Stable patent fourth ventricle. Slight increased subarachnoid blood at the cerebellar folia. Similar layering blood along the right tentorium. Multifocal subarachnoid hemorrhage involves the parietal, temporal and occipital lobes. Some of the blood appears slightly more dense/conspicuous compared to the prior CT but the overall distribution is  grossly unchanged. No midline shift. Vascular: No hyperdense vessels. Scattered carotid vascular calcification. Skull: No fracture. Sinuses/Orbits: No acute finding. Other: Incompletely visualized soft tissue emphysema within the superficial and deep spaces of the neck. IMPRESSION: 1. Grossly stable extra-axial hematoma along the right tentorium with localized mass effect. Layering blood along the right tentorium  also grossly unchanged. Slight increased foci of subarachnoid blood at the cerebellar folia. 2. Multifocal subarachnoid hemorrhage within the bilateral parietal, temporal, and occipital lobes. Some of the blood appears slightly more dense/conspicuous compared to the prior CT but the overall distribution is grossly unchanged. 3. Atrophy and chronic small vessel ischemic change of the white matter. 4. Incompletely visualized soft tissue emphysema within the superficial and deep spaces of the neck. Electronically Signed   By: Donavan Foil M.D.   On: 12/04/2019 02:59   CT Head Wo Contrast  Result Date: 12/03/2019 CLINICAL DATA:  Fall this past Wednesday, minor head trauma EXAM: CT HEAD WITHOUT CONTRAST TECHNIQUE: Contiguous axial images were obtained from the base of the skull through the vertex without intravenous contrast. COMPARISON:  Recent prior head CT 10/25/2019 FINDINGS: Brain: Multifocal intracranial hemorrhage. There is a relatively large subdural hematoma layering along the inferior aspect of the right tentorium cerebellum measuring 2.9 x 2.3 x 3.2 cm (volume = 11 cm^3). Additionally, there are foci subarachnoid hemorrhage layering within the sulci overlying the left cerebral hemisphere, and within the right parietal lobe sulci and left temporal lobe sulci. The subdural hematoma along the right tentorium exerts mass effect on the underlying cerebellum and also effaces the CSF space around the right hemi pons. No evidence of intraventricular hemorrhage. No evidence of intra or entrapment at this time. Stable cortical and central atrophy with ex vacuo ventriculomegaly of the lateral ventricles. No evidence of acute ischemia. Vascular: No hyperdense vessel or unexpected calcification. Skull: Normal. Negative for fracture or focal lesion. Sinuses/Orbits: No acute finding. Other: Extensive subcutaneous emphysema tracking along the muscular and soft tissue planes of the skull base and upper neck. IMPRESSION:  1. Multifocal intracranial hemorrhage most significantly a moderately large acute subdural hematoma along the inferior surface of the right tentorium cerebellum with local mass effect on the superior aspect of the right cerebellar hemisphere and effacement of the CSF spaces adjacent to the right pons. 2. Additional multifocal scattered foci of subarachnoid hemorrhage bilaterally. 3. Extensive subcutaneous emphysema. Critical Value/emergent results were called by telephone at the time of interpretation on 12/03/2019 at 12:14 pm to provider DAN FLOYD , who verbally acknowledged these results. Electronically Signed   By: Jacqulynn Cadet M.D.   On: 12/03/2019 12:14   CT ANGIO NECK W OR WO CONTRAST  Result Date: 12/03/2019 CLINICAL DATA:  Intracranial hemorrhage. Extra-axial hemorrhage along the right cerebellum and inferior to the tentorium. Subarachnoid hemorrhage EXAM: CT ANGIOGRAPHY HEAD AND NECK TECHNIQUE: Multidetector CT imaging of the head and neck was performed using the standard protocol during bolus administration of intravenous contrast. Multiplanar CT image reconstructions and MIPs were obtained to evaluate the vascular anatomy. Carotid stenosis measurements (when applicable) are obtained utilizing NASCET criteria, using the distal internal carotid diameter as the denominator. CONTRAST:  159mL OMNIPAQUE IOHEXOL 350 MG/ML SOLN COMPARISON:  CT head without contrast 12/03/2019. FINDINGS: CT HEAD FINDINGS CTA NECK FINDINGS Aortic arch: A 3 vessel arch configuration is present. Great vessel origins are within normal limits. Atherosclerotic changes are present in the distal arch. No aneurysm or stenosis is present. No traumatic injury is present. Right carotid system: The right  common carotid artery is within normal limits. Bifurcation is unremarkable. Cervical right ICA is normal. Left carotid system: The left common carotid artery is within normal limits. Minimal atherosclerotic changes are noted at the  bifurcation without significant stenosis. The cervical left ICA is normal. Vertebral arteries: The right vertebral artery is dominant. Both vertebral arteries originate from the subclavian arteries without significant stenosis. No significant stenosis is present in either vertebral artery in the neck. Skeleton: Degenerative anterolisthesis is present at C4-5. Multilevel degenerative endplate changes are present the lower cervical and upper thoracic spine. No acute fractures are present. Other neck: Extensive subcutaneous emphysema is noted salivary glands and ducts are within normal limits. No significant adenopathy is present. Multinodular thyroid is present. The largest lesion is in the left lobe measuring 2.5 cm. Upper chest: A large right pneumothorax is present. Right pleural effusion is present. Pneumomediastinum is noted. Anterior right second rib fracture is noted. Review of the MIP images confirms the above findings CTA HEAD FINDINGS Anterior circulation: Atherosclerotic changes are present within the cavernous internal carotid arteries bilaterally without significant stenosis from the skull base through the ICA termini. The A1 and M1 segments are normal. The anterior communicating artery is patent. MCA bifurcations are intact. The ACA and MCA branch vessels are within normal limits. Posterior circulation: The right vertebral artery is dominant vessel. The PICA origins are visualized and normal. Vertebrobasilar junction is normal. Both posterior cerebral arteries originate from the basilar tip. The PCA branch vessels are within normal limits bilaterally. Venous sinuses: The dural sinuses are patent. The straight sinus and deep cerebral veins are intact. Cortical veins are unremarkable. Anatomic variants: None Review of the MIP images confirms the above findings IMPRESSION: 1. Large right pneumothorax with pneumomediastinum and right pleural effusion. 2. Anterior right second rib fracture. 3. Normal CTA  Circle of Willis without significant proximal stenosis, aneurysm, or branch vessel occlusion. The extra-axial hemorrhage is likely posttraumatic. 4. Minimal atherosclerotic changes at the left carotid bifurcation and bilateral cavernous internal carotid arteries without significant stenosis. 5. Extensive subcutaneous emphysema. 6. Multinodular thyroid. The largest lesion is in the left lobe measuring 2.5 cm. Recommend thyroid US (ref: J Am Coll Radiol. 2015 Feb;12(2): 143-50). Electronically Signed   By: San Morelle M.D.   On: 12/03/2019 13:28   CT CHEST ABDOMEN PELVIS W CONTRAST  Result Date: 12/03/2019 CLINICAL DATA:  Trauma.  Pneumothorax and subcutaneous emphysema. EXAM: CT CHEST, ABDOMEN, AND PELVIS WITH CONTRAST TECHNIQUE: Multidetector CT imaging of the chest, abdomen and pelvis was performed following the standard protocol during bolus administration of intravenous contrast. CONTRAST:  159mL OMNIPAQUE IOHEXOL 350 MG/ML SOLN COMPARISON:  Chest x-ray 12/03/2019 FINDINGS: CT CHEST FINDINGS Cardiovascular: The heart is normal in size. No pericardial effusion. Mild tortuosity and calcification of the thoracic aorta but no aneurysm or dissection. The branch vessels are patent. LAD stent noted. Pacer wires in good position without complicating features. There is mild shift of the heart and mediastinum to the left side due to a large right pneumothorax. Mediastinum/Nodes: No mediastinal or hilar mass or adenopathy. There is extensive pneumomediastinum. The esophagus is grossly normal. There is a complex left thyroid lobe lesion measuring 2.4 cm. Recommend thyroid US (ref: J Am Coll Radiol. 2015 Feb;12(2): 143-50). Lungs/Pleura: There is a large right-sided pneumothorax estimated at 60%. There is mild shift of the heart mediastinum to the left suggesting a tension pneumothorax. Associated significant right lower lobe atelectasis and small to moderate-sized right pleural effusion. The left lung is clear.  Musculoskeletal: There is a displaced right second anterior rib fracture and associated adjacent pleural hematoma. This likely causes the pneumothorax. There also nondisplaced fractures involving the third and fourth ribs. No sternal fractures identified. Suspect remote healed upper sternal fracture. The thoracic vertebral bodies are normally aligned. No acute fracture. There are remote healed fractures involving the left posterior ribs and also a remote fracture of the left humeral head. CT ABDOMEN PELVIS FINDINGS Hepatobiliary: No acute hepatic injury is identified. No perihepatic fluid collections. No hepatic lesions. The gallbladder is normal. No common bile duct dilatation. Pancreas: No mass, inflammation or ductal dilatation. No acute pancreatic injury or peripancreatic fluid collection. Spleen: Normal size. No acute splenic injury or perisplenic fluid collection. Adrenals/Urinary Tract: The adrenal glands and kidneys are unremarkable except for a large simple appearing left renal cyst, right-sided parapelvic renal cysts and a lower pole left renal calculus. No acute renal injury or perirenal fluid collections. There is a Foley catheter noted in the bladder. There is a large bladder calculus measuring 16 mm. Stomach/Bowel: The stomach, duodenum, small bowel and colon are grossly normal. No acute inflammatory changes, mass lesions or obstructive findings. Vascular/Lymphatic: 4.7 cm infrarenal abdominal aortic aneurysm ending just above the iliac artery bifurcation. No dissection. Moderate atherosclerotic calcifications. Mild fusiform enlargement of both iliac arteries but no focal aneurysm or dissection. The major venous structures are patent. No mesenteric or retroperitoneal mass, adenopathy or hematoma. Reproductive: The prostate gland and seminal vesicles are grossly normal. Other: No pelvic mass or adenopathy. No free pelvic fluid collections. No inguinal mass or adenopathy. No abdominal wall hernia or  subcutaneous lesions. Musculoskeletal: Advanced degenerative lumbar spondylosis with multilevel disc disease and facet disease but no acute lumbar spine fracture. The bony pelvis is intact.  No hip or pelvic fractures. IMPRESSION: 1. Large right-sided pneumothorax estimated at 60% with mild shift of the heart and mediastinum to the left side suggesting a tension pneumothorax. 2. Right-sided rib fractures as discussed above. The displaced right second anterior rib fracture is likely the cause of the pneumothorax. 3. Large amount of pneumomediastinum. 4. No acute abdominal/pelvic findings, mass lesions or adenopathy. No acute abdominal/pelvic injury is identified. 5. 4.7 cm infrarenal abdominal aortic aneurysm. Recommend follow-up every 6 months and vascular consultation. This recommendation follows ACR consensus guidelines: White Paper of the ACR Incidental Findings Committee II on Vascular Findings. J Am Coll Radiol 2013; 10:789-794. 6. 2.4 cm left thyroid lesion. Recommend thyroid US (ref: J Am Coll Radiol. 2015 Feb;12(2): 143-50). 7. Large bladder calculus. These results were called by telephone at the time of interpretation on 12/03/2019 at 1:07 pm to provider DAN FLOYD , who verbally acknowledged these results. Aortic Atherosclerosis (ICD10-I70.0). Electronically Signed   By: Marijo Sanes M.D.   On: 12/03/2019 13:05   DG Chest Port 1 View  Result Date: 12/04/2019 CLINICAL DATA:  Pneumothorax. EXAM: PORTABLE CHEST 1 VIEW COMPARISON:  12/03/2019 FINDINGS: Right basilar chest tube appears to have pulled back since the prior study. Side holes appear to remain within the chest cavity. Small right apical pneumothorax again noted. Extensive subcutaneous emphysema in the right chest and neck unchanged. Improved aeration in the lung bases. Left lung remains clear. Negative for heart failure. AICD noted. Left coronary stent noted. IMPRESSION: Pigtail chest tube on the right has pulled back but appears remain in the  chest. Small right pneumothorax unchanged. Improvement in bibasilar atelectasis. Electronically Signed   By: Franchot Gallo M.D.   On: 12/04/2019 07:42   DG Chest  Portable 1 View  Result Date: 12/03/2019 CLINICAL DATA:  Pneumothorax, post chest tube placement EXAM: PORTABLE CHEST 1 VIEW COMPARISON:  CT 12/03/2019 FINDINGS: Partially coiled right pleural drain projects over the right upper lung. Question a trace residual right apical pneumothorax. Opacity in the right mid to lower lung lung base which could reflect residual atelectasis or some mild re-expansion edema or underlying consolidation. Additional atelectatic changes present in the left lung. Subcutaneous emphysema seen across the right chest wall and base of the neck bilaterally. Small volume pneumomediastinum remains. AICD battery pack projects over the left chest wall with lead in stable position towards the cardiac apex. Coronary stent is noted. Remaining cardiomediastinal contours are unremarkable. Rib fractures are better seen on comparison radiograph. IMPRESSION: 1. Question a trace residual right apical pneumothorax despite the placement of a right chest tube partially coiled in right upper lung. 2. Opacity in the right mid to lower lung base which could reflect residual atelectasis, some mild re-expansion edema or underlying consolidation. 3. Extensive subcutaneous emphysema across the right chest wall and base of the neck bilaterally. 4. Persistent pneumomediastinum. 5. Multiple right rib fractures better seen on CT. Electronically Signed   By: Lovena Le M.D.   On: 12/03/2019 15:01   DG Chest Portable 1 View  Result Date: 12/03/2019 CLINICAL DATA:  Shortness of breath, urinary retention, laps lung, CHF, ischemic cardiomyopathy, coronary artery disease post MI, hypertension EXAM: PORTABLE CHEST 1 VIEW COMPARISON:  Portable exam 1007 hours compared to 08/11/2019 FINDINGS: LEFT subclavian ICD lead projects over RIGHT ventricle. Normal heart  size post coronary stenting. Mediastinal contours and pulmonary vascularity normal. Asymmetric lucency of the RIGHT hemithorax at least in part due to superimposed chest wall emphysema, which extends into the cervical regions bilaterally. Small loculated pneumothorax identified at medial RIGHT upper lobe. Subsegmental atelectasis at bases. Remaining lungs clear without pulmonary infiltrate or pleural effusion. Bones demineralized. IMPRESSION: Significant RIGHT chest wall emphysema extending into the cervical regions bilaterally. Small loculated pneumothorax at medial RIGHT upper lobe. Critical Value/emergent results were called by telephone at the time of interpretation on 12/03/2019 at 10:33 am to provider DAN FLOYD MD, who verbally acknowledged these results. Electronically Signed   By: Lavonia Dana M.D.   On: 12/03/2019 10:34    Anti-infectives: Anti-infectives (From admission, onward)   None       Assessment/Plan 31M s/p mechanical fall  SDH/SAH - Per Dr. Christella Noa of [place]. Coumadin reversed w/ K Centra (9/12). INR 1.2 this AM. Repeat CTH with stable SDH, slightly increased SAH. Keppra. TBI therapies.  Large right side PTX with subcutaneous emphysema - S/p CT. CXR this AM with stable small R PTX. Continue suction at -20. CXR in the AM. PT/OT. Pulm toilet.  Right sided 2-4 rib fractures - Multimodal pain control. Pulm toilet  HTN - home meds  Hx of CHF - EF 25-30% (on Echo 08/12/2019). Cont telemetry. Continue home beta blocker. No evidence of fluid overload at this time. Holding Lasix. Gentle fluids (77ml/hr) OSA -continue home CPAP Hx CAD (prior LHC 07/2019) H/o DVT - Coumadin reversed w/ K Centra (9/12). INR 1.2 this AM. Holding Coumadin. Daily INR and CBC. NSGY recommending no anticoagulation in the future with frequent falls (3 falls in the last 3 weeks).  Infrarenal AAA - will need follow up outpatient Multinodular Thyroid - Will need outpatient thyroid US Urinary retention - Foley. Start  Urecholine  FEN - Reg, gentle IVF. Home PPI. Bowel regimen.  VTE - SCDs, hold chemical prophylaxis  ID - None  Foley - In place. Plan as above  Dispo - TBI therapies. Lives at home with his wife. Leave CT to suction. Keep in unit until cleared by [facility].   LOS: 1 day    Jillyn Ledger , [facility] 12/04/2019, 8:50 AM Please see Amion for pager number during day hours 7:00am-4:30pm

## 2019-12-04 NOTE — Consult Note (Signed)
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  Referring Physician: Trauma MD   HPI: Kevin Hart is a 74 y.o. male with history of CAD s/p stent, ICM s/p ICD, chronic systolic CHF, DVTs/ lupus anticoagulant- on coumadin, spinal stenosis with lumbar neuritis, gait disorder and dizziness with multiple recent falls with SDH 10/12/19 with resumption of coumadin/ASA 10 days post injury. He was admitted on 12/03/19 with reports of falls 5 days PTA with SOB, confusion and weakness. He was found to have rib fractures with pneumomediastinum treated with CT as well as moderately large acute SDH right tentorium with mass effect on right cerebellar hemisphere as well as multifocal intracranial hemorrhage and stable atrophy with ex vacuo ventriculomegaly. INR 3.5 at admission  and coumadin reversed with Kcentra. Foley placed for urinary retention and diuretics held due to soft BP.  Dr. Christella Noa recommended d/c of anticoagulation due to high fall risk as well as IVC filter due to hx of DVTs. Repeat CT head with stable SDH and slight increase in [facility]. 2D echo showed EF 30-35% with inferoapical filling defect likely due to trabeculation. Therapy evaluations completed yesterday showing limited mobility with shuffling gait/Parkinsonsim type gait as well as tendency for posterior bias.  CIR recommended due to functional decline.   Patient has been going to outpatient therapy for past 2 months. Has walker and cane but does not use consistently due to "too much trouble" and occasionally furniture walks. He reports an average of one fall/week due to dizziness.    Review of Systems  Eyes: Negative for blurred vision, double vision and pain.       Denies glaucoma or macular degeneration.   Respiratory: Negative for cough and shortness of breath.   Cardiovascular: Negative for chest pain and leg swelling.  Musculoskeletal: Positive for falls.  Negative for back pain, joint pain and myalgias.  Neurological: Negative for sensory change, speech change and focal weakness.  Psychiatric/Behavioral: Negative for memory loss.     Past Medical History:  Diagnosis Date  . Arthritis   . Bladder stone 10/26/2017  . BPH (benign prostatic hyperplasia)    takes Flomax daily  . CAD (coronary artery disease)    a. prior LAD stenting;  b. 06/2014 NSTEMI/PCI: LM nl, LAD 30p, 20m ISR (3.0x20 Promus DES), LCX nl, RCA nondom, nl, EF 25-30% ant, apical, dist inf AK.  . Cataracts, bilateral    immature  . Chronic systolic CHF (congestive heart failure) ([place]) 12/31/2014  . DVT (deep venous thrombosis) ([facility])    RECURRENT left leg  . Dyslipidemia   . GERD (gastroesophageal reflux disease)   . Gout   . History of kidney stones   . Hypertension   . Ischemic cardiomyopathy 07/06/2014  . Lupus anticoagulant positive   . Myocardial infarct ([place]) 2000  . Myocardial infarct ([place]) 2016  . S/P drug eluting coronary stent placement, 07/06/14, Promus 07/07/2014  . Sleep apnea    biPaP  . Subarachnoid bleed ([place]) 2015   after falling and hitting his head    Past Surgical History:  Procedure Laterality Date  . COLONOSCOPY    . EP IMPLANTABLE DEVICE [name] 11/27/2014   [employer] Fortify Assura VR  implanted by Dr Rayann Heman for primary prevention  . LEFT HEART CATH AND CORONARY ANGIOGRAPHY [name] 07/29/2018   Procedure: LEFT HEART CATH AND CORONARY ANGIOGRAPHY;  Surgeon: Sherren Mocha, MD;  Location: [facility];  Service:  Cardiovascular;  Laterality: [name];  . LEFT HEART CATH AND CORONARY ANGIOGRAPHY [name] 08/14/2019   Procedure: LEFT HEART CATH AND CORONARY ANGIOGRAPHY;  Surgeon: Troy Sine, MD;  Location: [place] [facility];  Service: Cardiovascular;  Laterality: [name];  . LEFT HEART CATHETERIZATION WITH CORONARY ANGIOGRAM [name] 07/06/2014   Procedure: LEFT HEART CATHETERIZATION WITH CORONARY ANGIOGRAM;  Surgeon: Peter M Martinique, MD;  Location: [facility] CATH  LAB;  Service: Cardiovascular;  Laterality: [name];  . PERCUTANEOUS CORONARY STENT INTERVENTION (PCI-S) Right 07/06/2014   Procedure: PERCUTANEOUS CORONARY STENT INTERVENTION (PCI-S);  Surgeon: Peter M Martinique, MD;  Location: [facility] CATH LAB;  Service: Cardiovascular;  Laterality: Right;  . SHOULDER ARTHROSCOPY WITH SUBACROMIAL DECOMPRESSION Left 03/05/2016   Procedure: SHOULDER ARTHROSCOPY ROTATOR CUFF REPAIR WITH SUBACROMIAL DECOMPRESSION;  Surgeon: Tania Ade, MD;  Location: [facility];  Service: Orthopedics;  Laterality: Left;  SHOULDER ARTHROSCOPY ROTATOR CUFF REPAIR WITH SUBACROMIAL DECOMPRESSION  . SHOULDER CLOSED REDUCTION Left 11/29/2015   Procedure: CLOSED REDUCTION SHOULDER;  Surgeon: Tania Ade, MD;  Location: [facility];  Service: Orthopedics;  Laterality: Left;  . TRANSURETHRAL RESECTION OF PROSTATE [name] 02/24/2018   Procedure: TRANSURETHRAL RESECTION OF THE PROSTATE (TURP);  Surgeon: Irine Seal, MD;  Location: [facility];  Service: Urology;  Laterality: [name];  . WRIST SURGERY Bilateral    plates and screws     Family History  Problem Relation Age of Onset  . Aneurysm Father      Social History:  Married. Wife with recent surgery. He has been going to outpatient PT for therapy. He reports that he has never smoked. He has never used smokeless tobacco. He reports current alcohol use. He reports that he does not use drugs.    Allergies: No Known Allergies    Medications Prior to Admission  Medication Sig Dispense Refill  . allopurinol (ZYLOPRIM) 300 MG tablet Take 150 mg by mouth 2 (two) times daily.     Marland Kitchen ascorbic acid (VITAMIN C) 500 MG tablet Take 500 mg by mouth daily.    . calcium-vitamin D (OSCAL WITH D) 500-200 MG-UNIT tablet Take 1 tablet by mouth.    . ezetimibe (ZETIA) 10 MG tablet Take 1 tablet (10 mg total) by mouth daily. 30 tablet 11  . furosemide (LASIX) 20 MG tablet Take 1 tablet (20 mg total) by mouth daily. 90 tablet 3  . levETIRAcetam (KEPPRA) 500 MG tablet Take 1 tablet  (500 mg total) by mouth 2 (two) times daily. 12 tablet 0  . LIVALO 4 MG TABS Take 4 mg by mouth at bedtime.     . Multiple Vitamin (ONE-A-DAY MENS PO) Take 1 tablet by mouth daily.    . nitroGLYCERIN (NITROSTAT) 0.4 MG SL tablet Place 1 tablet (0.4 mg total) under the tongue every 5 (five) minutes x 3 doses as needed for chest pain. 25 tablet 11  . pantoprazole (PROTONIX) 40 MG tablet Take 40 mg by mouth daily.    . potassium chloride SA (KLOR-CON) 20 MEQ tablet Take 20 mEq by mouth daily.    . propranolol (INDERAL) 20 MG tablet Take 20 mg by mouth 2 (two) times daily.    . sacubitril-valsartan (ENTRESTO) 24-26 MG Take 1 tablet by mouth daily. 60 tablet 3  . warfarin (COUMADIN) 5 MG tablet Take 0.5-1 tablets (2.5-5 mg total) by mouth See admin instructions. Take 5mg  on 5/25 and 5/26 then resume home regimen of 2.5mg  daily except 5mg  on Wednesday (Patient taking differently: Take 2.5-5 mg by mouth See admin instructions. Take 2.5mg   every day except on Wednesdays take 5 mg per spouse)    . aspirin 81 MG chewable tablet Chew 1 tablet (81 mg total) by mouth daily. (Patient not taking: Reported on 12/03/2019) 30 tablet 11    Home: Home Living Family/patient expects to be discharged to:: Private residence Living Arrangements: Spouse/significant other Available Help at Discharge: Available PRN/intermittently Type of Home: House Home Access: Stairs to enter [employer] of Steps: 2 Entrance Stairs-Rails: None Home Layout: Two level, Able to live on main level with bedroom/bathroom, Full bath on main level Bathroom Shower/Tub: Chiropodist: Standard Home Equipment: Civil engineer, contracting, Grab bars - tub/shower, Environmental consultant - 4 wheels, Hand held shower head, Adaptive equipment, Cane - single point Adaptive Equipment: Reacher Additional Comments: fell on concrete month ago with admission,   Functional History: Prior Function Level of Independence: Independent with assistive  device(s) ADL's / Homemaking Assistance Needed: wife only able to give S (A), reports getting in shower without seat Comments: wife had shoulder surgery few weeks ago - cousin to wife has been helping them both. wife is retired Therapist, sports. Daughter reports that house is very crowd and can not utilize RW in the home and walks without DME Functional Status:  Mobility:          ADL:    Cognition: Cognition Orientation Level: Oriented X4 Cognition Arousal/Alertness: Awake/alert  Blood pressure (!) 99/53, pulse 60, temperature 98.2 F (36.8 C), temperature source Axillary, resp. rate (!) 9, height 5\' 10"  (1.778 m), weight 93 kg, SpO2 96 %. Physical Exam General: Alert and oriented x 3, No apparent distress HEENT: Head is normocephalic, atraumatic, PERRLA, EOMI, sclera anicteric, oral mucosa pink and moist, dentition intact, ext ear canals clear,  Neck: Supple without JVD or lymphadenopathy Heart: Reg rate and rhythm. No murmurs rubs or gallops Chest: CTA bilaterally without wheezes, rales, or rhonchi; no distress Abdomen: Soft, non-tender, non-distended, bowel sounds positive. Extremities: No clubbing, cyanosis, or edema. Pulses are 2+ Skin: Clean and intact without signs of breakdown Neuro: Pt is cognitively appropriate with normal insight, memory, and awareness. Flat affect. Speech soft without dysarthria. He was able to answer orientation questions without difficulty. No cogwheeling or tremors noted.  4-5/ strength throughout with poor effort Psych: Pt's affect is flat. Pt is cooperative  Results for orders placed or performed during the hospital encounter of 12/03/19 (from the past 24 hour(s))  Protime-INR     Status: Abnormal   Collection Time: 12/03/19  5:10 PM  Result Value Ref Range   Prothrombin Time 16.0 (H) 11.4 - 15.2 seconds   INR 1.3 (H) 0.8 - 1.2  MRSA PCR Screening     Status: None   Collection Time: 12/03/19 10:47 PM   Specimen: Nasopharyngeal  Result Value Ref Range    MRSA by PCR NEGATIVE NEGATIVE  CBC     Status: None   Collection Time: 12/04/19  2:05 AM  Result Value Ref Range   WBC 8.3 4.0 - 10.5 K/uL   RBC 4.62 4.22 - 5.81 MIL/uL   Hemoglobin 14.0 13.0 - 17.0 g/dL   HCT 42.3 39 - 52 %   MCV 91.6 80.0 - 100.0 fL   MCH 30.3 26.0 - 34.0 pg   MCHC 33.1 30.0 - 36.0 g/dL   RDW 14.3 11.5 - 15.5 %   Platelets 198 150 - 400 K/uL   nRBC 0.0 0.0 - 0.2 %  Basic metabolic panel     Status: Abnormal   Collection Time: 12/04/19  2:05 AM  Result Value Ref Range   Sodium 142 135 - 145 mmol/L   Potassium 3.8 3.5 - 5.1 mmol/L   Chloride 105 98 - 111 mmol/L   CO2 28 22 - 32 mmol/L   Glucose, Bld 112 (H) 70 - 99 mg/dL   BUN 11 8 - 23 mg/dL   Creatinine, Ser 1.07 0.61 - 1.24 mg/dL   Calcium 9.8 8.9 - 10.3 mg/dL   GFR calc non Af Amer >60 >60 mL/min   GFR calc Af Amer >60 >60 mL/min   Anion gap 9 5 - 15  Protime-INR     Status: None   Collection Time: 12/04/19  2:05 AM  Result Value Ref Range   Prothrombin Time 15.1 11.4 - 15.2 seconds   INR 1.2 0.8 - 1.2   CT ANGIO HEAD W OR WO CONTRAST  Result Date: 12/03/2019 CLINICAL DATA:  Intracranial hemorrhage. Extra-axial hemorrhage along the right cerebellum and inferior to the tentorium. Subarachnoid hemorrhage EXAM: CT ANGIOGRAPHY HEAD AND NECK TECHNIQUE: Multidetector CT imaging of the head and neck was performed using the standard protocol during bolus administration of intravenous contrast. Multiplanar CT image reconstructions and MIPs were obtained to evaluate the vascular anatomy. Carotid stenosis measurements (when applicable) are obtained utilizing NASCET criteria, using the distal internal carotid diameter as the denominator. CONTRAST:  173mL OMNIPAQUE IOHEXOL 350 MG/ML SOLN COMPARISON:  CT head without contrast 12/03/2019. FINDINGS: CT HEAD FINDINGS CTA NECK FINDINGS Aortic arch: A 3 vessel arch configuration is present. Great vessel origins are within normal limits. Atherosclerotic changes are present in the  distal arch. No aneurysm or stenosis is present. No traumatic injury is present. Right carotid system: The right common carotid artery is within normal limits. Bifurcation is unremarkable. Cervical right ICA is normal. Left carotid system: The left common carotid artery is within normal limits. Minimal atherosclerotic changes are noted at the bifurcation without significant stenosis. The cervical left ICA is normal. Vertebral arteries: The right vertebral artery is dominant. Both vertebral arteries originate from the subclavian arteries without significant stenosis. No significant stenosis is present in either vertebral artery in the neck. Skeleton: Degenerative anterolisthesis is present at C4-5. Multilevel degenerative endplate changes are present the lower cervical and upper thoracic spine. No acute fractures are present. Other neck: Extensive subcutaneous emphysema is noted salivary glands and ducts are within normal limits. No significant adenopathy is present. Multinodular thyroid is present. The largest lesion is in the left lobe measuring 2.5 cm. Upper chest: A large right pneumothorax is present. Right pleural effusion is present. Pneumomediastinum is noted. Anterior right second rib fracture is noted. Review of the MIP images confirms the above findings CTA HEAD FINDINGS Anterior circulation: Atherosclerotic changes are present within the cavernous internal carotid arteries bilaterally without significant stenosis from the skull base through the ICA termini. The A1 and M1 segments are normal. The anterior communicating artery is patent. MCA bifurcations are intact. The ACA and MCA branch vessels are within normal limits. Posterior circulation: The right vertebral artery is dominant vessel. The PICA origins are visualized and normal. Vertebrobasilar junction is normal. Both posterior cerebral arteries originate from the basilar tip. The PCA branch vessels are within normal limits bilaterally. Venous  sinuses: The dural sinuses are patent. The straight sinus and deep cerebral veins are intact. Cortical veins are unremarkable. Anatomic variants: None Review of the MIP images confirms the above findings IMPRESSION: 1. Large right pneumothorax with pneumomediastinum and right pleural effusion. 2. Anterior right second rib fracture.  3. Normal CTA Circle of Willis without significant proximal stenosis, aneurysm, or branch vessel occlusion. The extra-axial hemorrhage is likely posttraumatic. 4. Minimal atherosclerotic changes at the left carotid bifurcation and bilateral cavernous internal carotid arteries without significant stenosis. 5. Extensive subcutaneous emphysema. 6. Multinodular thyroid. The largest lesion is in the left lobe measuring 2.5 cm. Recommend thyroid US (ref: J Am Coll Radiol. 2015 Feb;12(2): 143-50). Electronically Signed   By: San Morelle M.D.   On: 12/03/2019 13:28   CT HEAD WO CONTRAST  Result Date: 12/04/2019 CLINICAL DATA:  Follow-up subarachnoid hemorrhage EXAM: CT HEAD WITHOUT CONTRAST TECHNIQUE: Contiguous axial images were obtained from the base of the skull through the vertex without intravenous contrast. COMPARISON:  CT brain 12/03/2019, 10/25/2019 FINDINGS: Brain: No intracranial mass or territorial infarct is visualized. Moderate atrophy. Stable ventricle size. Mild hypodensity in the white matter consistent with chronic small vessel ischemic change. Grossly stable extra-axial hematoma along the right tentorium measuring approximately 2.9 x 2.1 by 3.3 cm with localized mass effect. Stable patent fourth ventricle. Slight increased subarachnoid blood at the cerebellar folia. Similar layering blood along the right tentorium. Multifocal subarachnoid hemorrhage involves the parietal, temporal and occipital lobes. Some of the blood appears slightly more dense/conspicuous compared to the prior CT but the overall distribution is grossly unchanged. No midline shift. Vascular: No  hyperdense vessels. Scattered carotid vascular calcification. Skull: No fracture. Sinuses/Orbits: No acute finding. Other: Incompletely visualized soft tissue emphysema within the superficial and deep spaces of the neck. IMPRESSION: 1. Grossly stable extra-axial hematoma along the right tentorium with localized mass effect. Layering blood along the right tentorium also grossly unchanged. Slight increased foci of subarachnoid blood at the cerebellar folia. 2. Multifocal subarachnoid hemorrhage within the bilateral parietal, temporal, and occipital lobes. Some of the blood appears slightly more dense/conspicuous compared to the prior CT but the overall distribution is grossly unchanged. 3. Atrophy and chronic small vessel ischemic change of the white matter. 4. Incompletely visualized soft tissue emphysema within the superficial and deep spaces of the neck. Electronically Signed   By: Donavan Foil M.D.   On: 12/04/2019 02:59   CT Head Wo Contrast  Result Date: 12/03/2019 CLINICAL DATA:  Fall this past Wednesday, minor head trauma EXAM: CT HEAD WITHOUT CONTRAST TECHNIQUE: Contiguous axial images were obtained from the base of the skull through the vertex without intravenous contrast. COMPARISON:  Recent prior head CT 10/25/2019 FINDINGS: Brain: Multifocal intracranial hemorrhage. There is a relatively large subdural hematoma layering along the inferior aspect of the right tentorium cerebellum measuring 2.9 x 2.3 x 3.2 cm (volume = 11 cm^3). Additionally, there are foci subarachnoid hemorrhage layering within the sulci overlying the left cerebral hemisphere, and within the right parietal lobe sulci and left temporal lobe sulci. The subdural hematoma along the right tentorium exerts mass effect on the underlying cerebellum and also effaces the CSF space around the right hemi pons. No evidence of intraventricular hemorrhage. No evidence of intra or entrapment at this time. Stable cortical and central atrophy with ex  vacuo ventriculomegaly of the lateral ventricles. No evidence of acute ischemia. Vascular: No hyperdense vessel or unexpected calcification. Skull: Normal. Negative for fracture or focal lesion. Sinuses/Orbits: No acute finding. Other: Extensive subcutaneous emphysema tracking along the muscular and soft tissue planes of the skull base and upper neck. IMPRESSION: 1. Multifocal intracranial hemorrhage most significantly a moderately large acute subdural hematoma along the inferior surface of the right tentorium cerebellum with local mass effect on the superior aspect  of the right cerebellar hemisphere and effacement of the CSF spaces adjacent to the right pons. 2. Additional multifocal scattered foci of subarachnoid hemorrhage bilaterally. 3. Extensive subcutaneous emphysema. Critical Value/emergent results were called by telephone at the time of interpretation on 12/03/2019 at 12:14 pm to provider DAN FLOYD , who verbally acknowledged these results. Electronically Signed   By: Jacqulynn Cadet M.D.   On: 12/03/2019 12:14   CT ANGIO NECK W OR WO CONTRAST  Result Date: 12/03/2019 CLINICAL DATA:  Intracranial hemorrhage. Extra-axial hemorrhage along the right cerebellum and inferior to the tentorium. Subarachnoid hemorrhage EXAM: CT ANGIOGRAPHY HEAD AND NECK TECHNIQUE: Multidetector CT imaging of the head and neck was performed using the standard protocol during bolus administration of intravenous contrast. Multiplanar CT image reconstructions and MIPs were obtained to evaluate the vascular anatomy. Carotid stenosis measurements (when applicable) are obtained utilizing NASCET criteria, using the distal internal carotid diameter as the denominator. CONTRAST:  146mL OMNIPAQUE IOHEXOL 350 MG/ML SOLN COMPARISON:  CT head without contrast 12/03/2019. FINDINGS: CT HEAD FINDINGS CTA NECK FINDINGS Aortic arch: A 3 vessel arch configuration is present. Great vessel origins are within normal limits. Atherosclerotic changes  are present in the distal arch. No aneurysm or stenosis is present. No traumatic injury is present. Right carotid system: The right common carotid artery is within normal limits. Bifurcation is unremarkable. Cervical right ICA is normal. Left carotid system: The left common carotid artery is within normal limits. Minimal atherosclerotic changes are noted at the bifurcation without significant stenosis. The cervical left ICA is normal. Vertebral arteries: The right vertebral artery is dominant. Both vertebral arteries originate from the subclavian arteries without significant stenosis. No significant stenosis is present in either vertebral artery in the neck. Skeleton: Degenerative anterolisthesis is present at C4-5. Multilevel degenerative endplate changes are present the lower cervical and upper thoracic spine. No acute fractures are present. Other neck: Extensive subcutaneous emphysema is noted salivary glands and ducts are within normal limits. No significant adenopathy is present. Multinodular thyroid is present. The largest lesion is in the left lobe measuring 2.5 cm. Upper chest: A large right pneumothorax is present. Right pleural effusion is present. Pneumomediastinum is noted. Anterior right second rib fracture is noted. Review of the MIP images confirms the above findings CTA HEAD FINDINGS Anterior circulation: Atherosclerotic changes are present within the cavernous internal carotid arteries bilaterally without significant stenosis from the skull base through the ICA termini. The A1 and M1 segments are normal. The anterior communicating artery is patent. MCA bifurcations are intact. The ACA and MCA branch vessels are within normal limits. Posterior circulation: The right vertebral artery is dominant vessel. The PICA origins are visualized and normal. Vertebrobasilar junction is normal. Both posterior cerebral arteries originate from the basilar tip. The PCA branch vessels are within normal limits  bilaterally. Venous sinuses: The dural sinuses are patent. The straight sinus and deep cerebral veins are intact. Cortical veins are unremarkable. Anatomic variants: None Review of the MIP images confirms the above findings IMPRESSION: 1. Large right pneumothorax with pneumomediastinum and right pleural effusion. 2. Anterior right second rib fracture. 3. Normal CTA Circle of Willis without significant proximal stenosis, aneurysm, or branch vessel occlusion. The extra-axial hemorrhage is likely posttraumatic. 4. Minimal atherosclerotic changes at the left carotid bifurcation and bilateral cavernous internal carotid arteries without significant stenosis. 5. Extensive subcutaneous emphysema. 6. Multinodular thyroid. The largest lesion is in the left lobe measuring 2.5 cm. Recommend thyroid US (ref: J Am Coll Radiol. 2015 Feb;12(2): 143-50).  Electronically Signed   By: San Morelle M.D.   On: 12/03/2019 13:28   CT CHEST ABDOMEN PELVIS W CONTRAST  Result Date: 12/03/2019 CLINICAL DATA:  Trauma.  Pneumothorax and subcutaneous emphysema. EXAM: CT CHEST, ABDOMEN, AND PELVIS WITH CONTRAST TECHNIQUE: Multidetector CT imaging of the chest, abdomen and pelvis was performed following the standard protocol during bolus administration of intravenous contrast. CONTRAST:  192mL OMNIPAQUE IOHEXOL 350 MG/ML SOLN COMPARISON:  Chest x-ray 12/03/2019 FINDINGS: CT CHEST FINDINGS Cardiovascular: The heart is normal in size. No pericardial effusion. Mild tortuosity and calcification of the thoracic aorta but no aneurysm or dissection. The branch vessels are patent. LAD stent noted. Pacer wires in good position without complicating features. There is mild shift of the heart and mediastinum to the left side due to a large right pneumothorax. Mediastinum/Nodes: No mediastinal or hilar mass or adenopathy. There is extensive pneumomediastinum. The esophagus is grossly normal. There is a complex left thyroid lobe lesion measuring 2.4  cm. Recommend thyroid US (ref: J Am Coll Radiol. 2015 Feb;12(2): 143-50). Lungs/Pleura: There is a large right-sided pneumothorax estimated at 60%. There is mild shift of the heart mediastinum to the left suggesting a tension pneumothorax. Associated significant right lower lobe atelectasis and small to moderate-sized right pleural effusion. The left lung is clear. Musculoskeletal: There is a displaced right second anterior rib fracture and associated adjacent pleural hematoma. This likely causes the pneumothorax. There also nondisplaced fractures involving the third and fourth ribs. No sternal fractures identified. Suspect remote healed upper sternal fracture. The thoracic vertebral bodies are normally aligned. No acute fracture. There are remote healed fractures involving the left posterior ribs and also a remote fracture of the left humeral head. CT ABDOMEN PELVIS FINDINGS Hepatobiliary: No acute hepatic injury is identified. No perihepatic fluid collections. No hepatic lesions. The gallbladder is normal. No common bile duct dilatation. Pancreas: No mass, inflammation or ductal dilatation. No acute pancreatic injury or peripancreatic fluid collection. Spleen: Normal size. No acute splenic injury or perisplenic fluid collection. Adrenals/Urinary Tract: The adrenal glands and kidneys are unremarkable except for a large simple appearing left renal cyst, right-sided parapelvic renal cysts and a lower pole left renal calculus. No acute renal injury or perirenal fluid collections. There is a Foley catheter noted in the bladder. There is a large bladder calculus measuring 16 mm. Stomach/Bowel: The stomach, duodenum, small bowel and colon are grossly normal. No acute inflammatory changes, mass lesions or obstructive findings. Vascular/Lymphatic: 4.7 cm infrarenal abdominal aortic aneurysm ending just above the iliac artery bifurcation. No dissection. Moderate atherosclerotic calcifications. Mild fusiform enlargement of  both iliac arteries but no focal aneurysm or dissection. The major venous structures are patent. No mesenteric or retroperitoneal mass, adenopathy or hematoma. Reproductive: The prostate gland and seminal vesicles are grossly normal. Other: No pelvic mass or adenopathy. No free pelvic fluid collections. No inguinal mass or adenopathy. No abdominal wall hernia or subcutaneous lesions. Musculoskeletal: Advanced degenerative lumbar spondylosis with multilevel disc disease and facet disease but no acute lumbar spine fracture. The bony pelvis is intact.  No hip or pelvic fractures. IMPRESSION: 1. Large right-sided pneumothorax estimated at 60% with mild shift of the heart and mediastinum to the left side suggesting a tension pneumothorax. 2. Right-sided rib fractures as discussed above. The displaced right second anterior rib fracture is likely the cause of the pneumothorax. 3. Large amount of pneumomediastinum. 4. No acute abdominal/pelvic findings, mass lesions or adenopathy. No acute abdominal/pelvic injury is identified. 5. 4.7 cm infrarenal abdominal  aortic aneurysm. Recommend follow-up every 6 months and vascular consultation. This recommendation follows ACR consensus guidelines: White Paper of the ACR Incidental Findings Committee II on Vascular Findings. J Am Coll Radiol 2013; 10:789-794. 6. 2.4 cm left thyroid lesion. Recommend thyroid US (ref: J Am Coll Radiol. 2015 Feb;12(2): 143-50). 7. Large bladder calculus. These results were called by telephone at the time of interpretation on 12/03/2019 at 1:07 pm to provider DAN FLOYD , who verbally acknowledged these results. Aortic Atherosclerosis (ICD10-I70.0). Electronically Signed   By: Marijo Sanes M.D.   On: 12/03/2019 13:05   DG Chest Port 1 View  Result Date: 12/04/2019 CLINICAL DATA:  Pneumothorax. EXAM: PORTABLE CHEST 1 VIEW COMPARISON:  12/03/2019 FINDINGS: Right basilar chest tube appears to have pulled back since the prior study. Side holes appear to  remain within the chest cavity. Small right apical pneumothorax again noted. Extensive subcutaneous emphysema in the right chest and neck unchanged. Improved aeration in the lung bases. Left lung remains clear. Negative for heart failure. AICD noted. Left coronary stent noted. IMPRESSION: Pigtail chest tube on the right has pulled back but appears remain in the chest. Small right pneumothorax unchanged. Improvement in bibasilar atelectasis. Electronically Signed   By: Franchot Gallo M.D.   On: 12/04/2019 07:42   DG Chest Portable 1 View  Result Date: 12/03/2019 CLINICAL DATA:  Pneumothorax, post chest tube placement EXAM: PORTABLE CHEST 1 VIEW COMPARISON:  CT 12/03/2019 FINDINGS: Partially coiled right pleural drain projects over the right upper lung. Question a trace residual right apical pneumothorax. Opacity in the right mid to lower lung lung base which could reflect residual atelectasis or some mild re-expansion edema or underlying consolidation. Additional atelectatic changes present in the left lung. Subcutaneous emphysema seen across the right chest wall and base of the neck bilaterally. Small volume pneumomediastinum remains. AICD battery pack projects over the left chest wall with lead in stable position towards the cardiac apex. Coronary stent is noted. Remaining cardiomediastinal contours are unremarkable. Rib fractures are better seen on comparison radiograph. IMPRESSION: 1. Question a trace residual right apical pneumothorax despite the placement of a right chest tube partially coiled in right upper lung. 2. Opacity in the right mid to lower lung base which could reflect residual atelectasis, some mild re-expansion edema or underlying consolidation. 3. Extensive subcutaneous emphysema across the right chest wall and base of the neck bilaterally. 4. Persistent pneumomediastinum. 5. Multiple right rib fractures better seen on CT. Electronically Signed   By: Lovena Le M.D.   On: 12/03/2019 15:01    DG Chest Portable 1 View  Result Date: 12/03/2019 CLINICAL DATA:  Shortness of breath, urinary retention, laps lung, CHF, ischemic cardiomyopathy, coronary artery disease post MI, hypertension EXAM: PORTABLE CHEST 1 VIEW COMPARISON:  Portable exam 1007 hours compared to 08/11/2019 FINDINGS: LEFT subclavian ICD lead projects over RIGHT ventricle. Normal heart size post coronary stenting. Mediastinal contours and pulmonary vascularity normal. Asymmetric lucency of the RIGHT hemithorax at least in part due to superimposed chest wall emphysema, which extends into the cervical regions bilaterally. Small loculated pneumothorax identified at medial RIGHT upper lobe. Subsegmental atelectasis at bases. Remaining lungs clear without pulmonary infiltrate or pleural effusion. Bones demineralized. IMPRESSION: Significant RIGHT chest wall emphysema extending into the cervical regions bilaterally. Small loculated pneumothorax at medial RIGHT upper lobe. Critical Value/emergent results were called by telephone at the time of interpretation on 12/03/2019 at 10:33 am to provider DAN FLOYD MD, who verbally acknowledged these results. Electronically Signed  By: Lavonia Dana M.D.   On: 12/03/2019 10:34     Assessment/Plan: Diagnosis: SDH 1. Does the need for close, 24 hr/day medical supervision in concert with the patient's rehab needs make it unreasonable for this patient to be served in a less intensive setting? Yes 2. Co-Morbidities requiring supervision/potential complications: rib fractures, shortness of breath, multiple recent falls, overweight (29.41), ICM s/p ICD, CAD s/p stent, chronic systolic CHF, DVT s/p lupus anticoagulant 3. Due to bladder management, bowel management, safety, skin/wound care, disease management, medication administration, pain management and patient education, does the patient require 24 hr/day rehab nursing? Yes 4. Does the patient require coordinated care of a physician, rehab nurse,  therapy disciplines of PT, OT, SLP to address physical and functional deficits in the context of the above medical diagnosis(es)? Yes Addressing deficits in the following areas: balance, endurance, locomotion, strength, transferring, bowel/bladder control, bathing, dressing, feeding, grooming, toileting, cognition and psychosocial support 5. Can the patient actively participate in an intensive therapy program of at least 3 hrs of therapy per day at least 5 days per week? Yes 6. The potential for patient to make measurable gains while on inpatient rehab is good 7. Anticipated functional outcomes upon discharge from inpatient rehab are modified independent  with PT, modified independent with OT, modified independent with SLP. 8. Estimated rehab length of stay to reach the above functional goals is: 2-3 weeks 9. Anticipated discharge destination: Home 10. Overall Rehab/Functional Prognosis: excellent  RECOMMENDATIONS: This patient's condition is appropriate for continued rehabilitative care in the following setting: CIR Patient has agreed to participate in recommended program. Yes Note that insurance prior authorization may be required for reimbursement for recommended care.  Comment: Thank you for this consult. Admission coordinator to follow.   I have personally performed a face to face diagnostic evaluation, including, but not limited to relevant history and physical exam findings, of this patient and developed relevant assessment and plan.  Additionally, I have reviewed and concur with the physician assistant's documentation above.  Leeroy Cha, MD  Bary Leriche, PA-C 12/04/2019

## 2019-12-04 NOTE — Evaluation (Signed)
Occupational Therapy Evaluation Patient Details Name: Kevin Hart MRN: [id number] DOB: January 18, 1946 Today's Date: 12/04/2019    History of Present Illness 74 yo male s/p fall with R cerebellum SDH SAH R side 2-4 rib fxs and Large R PTX with subcutaneous emphysema Pt iwth x3 falls in 3 weeks.  PMH OSA CAD DVT HTN arthritis bil cataracts CHF lupus MI 2015 s/p fall with SAH scoliosis   Clinical Impression   PT admitted with s/p fall with TBI R rib fx and Large R PTX. Pt currently with functional limitiations due to the deficits listed below (see OT problem list). Pt with hx of falls and 3 recently. Pt noted to be modified independent in July and daughter shows video of patient with 4WW walking normal gait speed during acute admission at [facility] also from a fall. Pt with progressive weakness noted this session. Pt motivated to attend CIR so that he can progress to MOD I with 4WW to go to the beach.  Pt will benefit from skilled OT to increase their independence and safety with adls and balance to allow discharge CIR.     Follow Up Recommendations  CIR    Equipment Recommendations  3 in 1 bedside commode    Recommendations for Other Services Rehab consult     Precautions / Restrictions Precautions Precautions: Fall Precaution Comments: foley R chest tube on suction      Mobility Bed Mobility Overal bed mobility: Needs Assistance Bed Mobility: Supine to Sit;Sit to Supine     Supine to sit: +2 for physical assistance;Max assist Sit to supine: +2 for physical assistance;Max assist   General bed mobility comments: requires (A) to come to the EOB and 1 step commands. pT requires helicopter back to bed surface due to chest tube and rib fxs  Transfers Overall transfer level: Needs assistance Equipment used: 2 person hand held assist Transfers: Sit to/from Stand Sit to Stand: +2 physical assistance;Max assist         General transfer comment: pt with strong posterior bias with toes  extended and off the floor. pt with all weight on heels. daughter reports concerns from family that patient coudl have Parkinsons but not addressed. pt states a few steps with narrowed BOS and short shuffled steps    Balance Overall balance assessment: Needs assistance Sitting-balance support: Bilateral upper extremity supported;Feet supported Sitting balance-Leahy Scale: Poor   Postural control: Left lateral lean;Posterior lean Standing balance support: Bilateral upper extremity supported;During functional activity Standing balance-Leahy Scale: Poor Standing balance comment: posterior bias                           ADL either performed or assessed with clinical judgement   ADL Overall ADL's : Needs assistance/impaired Eating/Feeding: Set up;Bed level   Grooming: Wash/dry face;Wash/dry hands;Bed level;Set up   Upper Body Bathing: Moderate assistance   Lower Body Bathing: Maximal assistance           Toilet Transfer: +2 for physical assistance;Maximal assistance;Stand-pivot;BSC Toilet Transfer Details (indicate cue type and reason): simulated         Functional mobility during ADLs: +2 for physical assistance;Maximal assistance       Vision Baseline Vision/History: Wears glasses Wears Glasses: At all times Patient Visual Report: No change from baseline Vision Assessment?: No apparent visual deficits     Perception     Praxis      Pertinent Vitals/Pain Pain Assessment: No/denies pain     Hand  Dominance Right   Extremity/Trunk Assessment Upper Extremity Assessment Upper Extremity Assessment: Generalized weakness;LUE deficits/detail LUE Deficits / Details: scapula with minimal movement. Pt reports bicep tear with shoulder being put back into palce 4 years ago with progressive weakness to arm   Lower Extremity Assessment Lower Extremity Assessment: Defer to PT evaluation   Cervical / Trunk Assessment Cervical / Trunk Assessment: Kyphotic    Communication Communication Communication: No difficulties   Cognition Arousal/Alertness: Awake/alert Behavior During Therapy: Flat affect Overall Cognitive Status: Impaired/Different from baseline Area of Impairment: Awareness                           Awareness: Emergent   General Comments: decrease awareness of falling   General Comments  VSS [id number] (**)    Exercises     Shoulder Instructions      Home Living Family/patient expects to be discharged to:: Private residence Living Arrangements: Spouse/significant other Available Help at Discharge: Available PRN/intermittently Type of Home: House Home Access: Stairs to enter [employer] of Steps: 2 Entrance Stairs-Rails: None Home Layout: Two level;Able to live on main level with bedroom/bathroom;Full bath on main level     Bathroom Shower/Tub: Teacher, early years/pre: Standard     Home Equipment: Shower seat;Grab bars - tub/shower;Walker - 4 wheels;Hand held shower head;Adaptive equipment;Cane - single point Adaptive Equipment: Reacher Additional Comments: fell on concrete month ago with admission,       Prior Functioning/Environment Level of Independence: Independent with assistive device(s)    ADL's / Homemaking Assistance Needed: wife only able to give S (A), reports getting in shower without seat   Comments: wife had shoulder surgery few weeks ago - cousin to wife has been helping them both. wife is retired Therapist, sports. Daughter reports that house is very crowd and can not utilize RW in the home and walks without DME        OT Problem List: Decreased strength;Decreased activity tolerance;Impaired balance (sitting and/or standing);Decreased cognition;Decreased safety awareness;Decreased knowledge of use of DME or AE;Decreased knowledge of precautions;Cardiopulmonary status limiting activity;Impaired UE functional use;Pain      OT Treatment/Interventions: Self-care/ADL  training;Therapeutic exercise;Neuromuscular education;Energy conservation;DME and/or AE instruction;Manual therapy;Modalities;Therapeutic activities;Cognitive remediation/compensation;Patient/family education;Balance training    OT Goals(Current goals can be found in the care plan section) Acute Rehab OT Goals Patient Stated Goal: to go to the beach oct 9 OT Goal Formulation: With patient/family Time For Goal Achievement: 12/18/19 Potential to Achieve Goals: Good  OT Frequency: Min 2X/week   Barriers to D/C: Decreased caregiver support  wife with surgery within the last week       Co-evaluation PT/OT/SLP Co-Evaluation/Treatment: Yes Reason for Co-Treatment: Necessary to address cognition/behavior during functional activity;For patient/therapist safety;To address functional/ADL transfers   OT goals addressed during session: ADL's and self-care;Proper use of Adaptive equipment and DME;Strengthening/ROM      AM-PAC OT "6 Clicks" Daily Activity     Outcome Measure Help from another person eating meals?: A Little Help from another person taking care of personal grooming?: A Little Help from another person toileting, which includes using toliet, bedpan, or urinal?: A Lot Help from another person bathing (including washing, rinsing, drying)?: A Lot Help from another person to put on and taking off regular upper body clothing?: A Lot Help from another person to put on and taking off regular lower body clothing?: A Lot 6 Click Score: 14   End of Session Equipment Utilized During Treatment: Oxygen Nurse Communication:  Mobility status;Precautions  Activity Tolerance: Patient tolerated treatment well Patient left: in bed;with call bell/phone within reach;with bed alarm set;with nursing/sitter in room;with family/visitor present  OT Visit Diagnosis: Unsteadiness on feet (R26.81);Muscle weakness (generalized) (M62.81);Pain                Time: 1355-1432 OT Time Calculation (min): 37  min Charges:  OT General Charges $OT Visit: 1 Visit OT Evaluation $OT Eval Moderate Complexity: 1 Mod   Brynn, OTR/L  Acute Rehabilitation Services Pager: [phone] Office: [phone] .   Jeri Modena 12/04/2019, 4:42 PM

## 2019-12-04 NOTE — Progress Notes (Signed)
  Echocardiogram 2D Echocardiogram has been performed with [employer].  Kevin Hart 12/04/2019, 5:52 PM

## 2019-12-04 NOTE — Evaluation (Signed)
Physical Therapy Evaluation Patient Details Name: Kevin Hart MRN: [id number] DOB: 1945/06/23 Today's Date: 12/04/2019   History of Present Illness  74 yo male s/p fall with R cerebellum SDH SAH R side 2-4 rib fxs and Large R PTX with subcutaneous emphysema Pt iwth x3 falls in 3 weeks.  PMH OSA CAD DVT HTN arthritis bil cataracts CHF lupus MI 2015 s/p fall with SAH scoliosis  Clinical Impression  Pt admitted with/for falls over 3 weeks sustaining R cerebellar SDH/SAH.  Pt needing maximal assist for basic mobility with gait very limited. .  Pt currently limited functionally due to the problems listed. ( See problems list.)   Pt will benefit from PT to maximize function and safety in order to get ready for next venue listed below.     Follow Up Recommendations CIR;Supervision/Assistance - 24 hour    Equipment Recommendations  3in1 (PT)    Recommendations for Other Services Rehab consult     Precautions / Restrictions Precautions Precautions: Fall Precaution Comments: foley R chest tube on suction      Mobility  Bed Mobility Overal bed mobility: Needs Assistance Bed Mobility: Supine to Sit;Sit to Supine     Supine to sit: +2 for physical assistance;Max assist Sit to supine: +2 for physical assistance;Max assist   General bed mobility comments: requires (A) to come to the EOB and 1 step commands. pT requires helicopter back to bed surface due to chest tube and rib fxs  Transfers Overall transfer level: Needs assistance Equipment used: 2 person hand held assist Transfers: Sit to/from Stand Sit to Stand: +2 physical assistance;Max assist         General transfer comment: pt with strong posterior bias with toes extended and off the floor. pt with all weight on heels. daughter reports concerns from family that patient coudl have Parkinsons but not addressed. pt states a few steps with narrowed BOS and short shuffled steps  Ambulation/Gait Ambulation/Gait assistance: Max  assist;+2 physical assistance Gait Distance (Feet): 2 Feet (forward and back ) Assistive device: IV Pole;1 person hand held assist;2 person hand held assist Gait Pattern/deviations: Step-to pattern;Decreased step length - right;Decreased step length - left;Shuffle   Gait velocity interpretation: <1.31 ft/sec, indicative of household ambulator General Gait Details: pt's gait limited to short distance due to quick fatigue.  Characterized by short, narrow-based shuffles steps, locking up in a parkinson's like fashion.  Stairs            Wheelchair Mobility    Modified Rankin (Stroke Patients Only)       Balance Overall balance assessment: Needs assistance Sitting-balance support: Bilateral upper extremity supported;Feet supported Sitting balance-Leahy Scale: Poor   Postural control: Left lateral lean;Posterior lean Standing balance support: Bilateral upper extremity supported;During functional activity Standing balance-Leahy Scale: Poor Standing balance comment: posterior bias                             Pertinent Vitals/Pain      Home Living Family/patient expects to be discharged to:: Private residence Living Arrangements: Spouse/significant other Available Help at Discharge: Available PRN/intermittently Type of Home: House Home Access: Stairs to enter Entrance Stairs-Rails: None Entrance Stairs-Number of Steps: 2 Home Layout: Two level;Able to live on main level with bedroom/bathroom;Full bath on main level Home Equipment: Shower seat;Grab bars - tub/shower;Walker - 4 wheels;Hand held shower head;Adaptive equipment;Cane - single point Additional Comments: fell on concrete month ago with admission,  Prior Function Level of Independence: Independent with assistive device(s)      ADL's / Homemaking Assistance Needed: wife only able to give S (A), reports getting in shower without seat  Comments: wife had shoulder surgery few weeks ago - cousin to wife  has been helping them both. wife is retired Therapist, sports. Daughter reports that house is very crowd and can not utilize RW in the home and walks without DME     Hand Dominance   Dominant Hand: Right    Extremity/Trunk Assessment   Upper Extremity Assessment LUE Deficits / Details: scapula with minimal movement. Pt reports bicep tear with shoulder being put back into palce 4 years ago with progressive weakness to arm    Lower Extremity Assessment Lower Extremity Assessment: Generalized weakness    Cervical / Trunk Assessment Cervical / Trunk Assessment: Kyphotic  Communication   Communication: No difficulties  Cognition Arousal/Alertness: Awake/alert Behavior During Therapy: Flat affect Overall Cognitive Status: Impaired/Different from baseline Area of Impairment: Awareness                           Awareness: Emergent   General Comments: decrease awareness of falling      General Comments General comments (skin integrity, edema, etc.): vss    Exercises Other Exercises Other Exercises: Warm up hip/knee ROM exercise prior to mobility   Assessment/Plan    PT Assessment Patient needs continued PT services  PT Problem List Decreased strength;Decreased activity tolerance;Decreased balance;Decreased mobility;Decreased coordination;Decreased knowledge of use of DME       PT Treatment Interventions Gait training;DME instruction;Functional mobility training;Therapeutic activities;Balance training;Neuromuscular re-education;Patient/family education    PT Goals (Current goals can be found in the Care Plan section)  Acute Rehab PT Goals Patient Stated Goal: to go to the beach oct 9 PT Goal Formulation: With patient Time For Goal Achievement: 12/18/19 Potential to Achieve Goals: Good    Frequency Min 3X/week   Barriers to discharge        Co-evaluation PT/OT/SLP Co-Evaluation/Treatment: Yes Reason for Co-Treatment: Necessary to address cognition/behavior during  functional activity;To address functional/ADL transfers PT goals addressed during session: Mobility/safety with mobility         AM-PAC PT "6 Clicks" Mobility  Outcome Measure Help needed turning from your back to your side while in a flat bed without using bedrails?: Total Help needed moving from lying on your back to sitting on the side of a flat bed without using bedrails?: Total Help needed moving to and from a bed to a chair (including a wheelchair)?: Total Help needed standing up from a chair using your arms (e.g., wheelchair or bedside chair)?: Total Help needed to walk in hospital room?: Total Help needed climbing 3-5 steps with a railing? : Total 6 Click Score: 6    End of Session   Activity Tolerance: Patient tolerated treatment well Patient left: in bed;with call bell/phone within reach;with nursing/sitter in room;with family/visitor present Nurse Communication: Mobility status PT Visit Diagnosis: Unsteadiness on feet (R26.81);Other abnormalities of gait and mobility (R26.89);Muscle weakness (generalized) (M62.81);Difficulty in walking, not elsewhere classified (R26.2)    Time: 1601-0932 PT Time Calculation (min) (ACUTE ONLY): 37 min   Charges:   PT Evaluation $PT Eval Moderate Complexity: 1 Mod          12/04/2019  Ginger Carne., PT Acute Rehabilitation Services [phone]  (pager) [phone]  (office)  Tessie Fass Kevin Hart 12/04/2019, 7:11 PM

## 2019-12-05 ENCOUNTER — Encounter (HOSPITAL_COMMUNITY): Payer: Self-pay

## 2019-12-05 ENCOUNTER — Inpatient Hospital Stay (HOSPITAL_COMMUNITY): Payer: PPO

## 2019-12-05 ENCOUNTER — Encounter: Payer: PPO | Admitting: Physical Therapy

## 2019-12-05 DIAGNOSIS — Z7901 Long term (current) use of anticoagulants: Secondary | ICD-10-CM

## 2019-12-05 DIAGNOSIS — I5022 Chronic systolic (congestive) heart failure: Secondary | ICD-10-CM

## 2019-12-05 DIAGNOSIS — I10 Essential (primary) hypertension: Secondary | ICD-10-CM

## 2019-12-05 DIAGNOSIS — Z86718 Personal history of other venous thrombosis and embolism: Secondary | ICD-10-CM

## 2019-12-05 DIAGNOSIS — I714 Abdominal aortic aneurysm, without rupture: Secondary | ICD-10-CM

## 2019-12-05 DIAGNOSIS — D6862 Lupus anticoagulant syndrome: Secondary | ICD-10-CM

## 2019-12-05 DIAGNOSIS — R931 Abnormal findings on diagnostic imaging of heart and coronary circulation: Secondary | ICD-10-CM

## 2019-12-05 DIAGNOSIS — I722 Aneurysm of renal artery: Secondary | ICD-10-CM

## 2019-12-05 DIAGNOSIS — R296 Repeated falls: Secondary | ICD-10-CM

## 2019-12-05 DIAGNOSIS — Z951 Presence of aortocoronary bypass graft: Secondary | ICD-10-CM

## 2019-12-05 DIAGNOSIS — I609 Nontraumatic subarachnoid hemorrhage, unspecified: Secondary | ICD-10-CM

## 2019-12-05 DIAGNOSIS — I2510 Atherosclerotic heart disease of native coronary artery without angina pectoris: Secondary | ICD-10-CM

## 2019-12-05 DIAGNOSIS — J939 Pneumothorax, unspecified: Secondary | ICD-10-CM

## 2019-12-05 DIAGNOSIS — I509 Heart failure, unspecified: Secondary | ICD-10-CM

## 2019-12-05 LAB — BASIC METABOLIC PANEL
Anion gap: 7 (ref 5–15)
BUN: 12 mg/dL (ref 8–23)
CO2: 27 mmol/L (ref 22–32)
Calcium: 9.4 mg/dL (ref 8.9–10.3)
Chloride: 102 mmol/L (ref 98–111)
Creatinine, Ser: 1.05 mg/dL (ref 0.61–1.24)
GFR calc Af Amer: >60 mL/min (ref >60)
GFR calc non Af Amer: >60 mL/min (ref >60)
Glucose, Bld: 127 mg/dL — ABNORMAL HIGH (ref 70–99)
Potassium: 3.8 mmol/L (ref 3.5–5.1)
Sodium: 136 mmol/L (ref 135–145)

## 2019-12-05 LAB — CBC
HCT: 38.2 % — ABNORMAL LOW (ref 39.0–52.0)
Hemoglobin: 12.7 g/dL — ABNORMAL LOW (ref 13.0–17.0)
MCH: 30.2 pg (ref 26.0–34.0)
MCHC: 33.2 g/dL (ref 30.0–36.0)
MCV: 90.7 fL (ref 80.0–100.0)
Platelets: 184 10*3/uL (ref 150–400)
RBC: 4.21 MIL/uL — ABNORMAL LOW (ref 4.22–5.81)
RDW: 14 % (ref 11.5–15.5)
WBC: 7.8 10*3/uL (ref 4.0–10.5)
nRBC: 0 % (ref 0.0–0.2)

## 2019-12-05 LAB — PROTIME-INR
INR: 1.3 — ABNORMAL HIGH (ref 0.8–1.2)
Prothrombin Time: 15.5 s — ABNORMAL HIGH (ref 11.4–15.2)

## 2019-12-05 MED ORDER — BETHANECHOL CHLORIDE 25 MG PO TABS
25.0000 mg | ORAL_TABLET | Freq: Three times a day (TID) | ORAL | Status: DC
  Administered 2019-12-05 – 2019-12-11 (×21): 25 mg via ORAL
  Filled 2019-12-05 (×22): qty 1

## 2019-12-05 NOTE — Evaluation (Signed)
Speech Language Pathology Evaluation Patient Details Name: Kevin Hart MRN: [id number] DOB: 1945/05/10 Today's Date: 12/05/2019 Time: 7124-5809 SLP Time Calculation (min) (ACUTE ONLY): 22 min  Problem List:  Patient Active Problem List   Diagnosis Date Noted  . Tension pneumothorax 12/03/2019  . SDH (subdural hematoma) ([place]) 10/12/2019  . Hypokalemia   . OSA on CPAP   . Chest pain 08/11/2019  . Precordial chest pain   . Unstable angina ([place]) 07/26/2018  . BPH with urinary obstruction 02/24/2018  . Renal hemorrhage, right 10/26/2017  . S/P left rotator cuff repair 03/05/2016  . Anterior dislocation of left shoulder 11/29/2015  . History of implantable cardioverter-defibrillator (ICD) placement 03/16/2015  . ICD (implantable cardioverter-defibrillator) in place   . Chronic systolic heart failure ([place]) 12/31/2014  . Chronic systolic dysfunction of left ventricle 11/27/2014  . S/P drug eluting coronary stent placement, mLAD 07/06/14, Promus 07/07/2014  . At risk for sudden cardiac death, EF 25-30% with PVCs, with lifevest 07/07/2014  . Coronary artery disease involving native coronary artery of native heart with unstable angina pectoris ([place])   . Cardiomyopathy, ischemic   . History of DVT (deep vein thrombosis), on coumadin   . NSTEMI (non-ST elevated myocardial infarction) ([place]) 07/05/2014  . CAD (coronary artery disease)   . Recurrent acute deep vein thrombosis (DVT) of lower extremity ([place])   . Dyslipidemia   . GERD (gastroesophageal reflux disease)   . Anterior myocardial infarction ([place])   . Lupus anticoagulant positive    Past Medical History:  Past Medical History:  Diagnosis Date  . Arthritis   . Bladder stone 10/26/2017  . BPH (benign prostatic hyperplasia)    takes Flomax daily  . CAD (coronary artery disease)    a. prior LAD stenting;  b. 06/2014 NSTEMI/PCI: LM nl, LAD 30p, 77m ISR (3.0x20 Promus DES), LCX nl, RCA nondom, nl, EF 25-30% ant, apical, dist inf [place].   . Cataracts, bilateral    immature  . Chronic systolic CHF (congestive heart failure) ([place]) 12/31/2014  . DVT (deep venous thrombosis) ([facility])    RECURRENT left leg  . Dyslipidemia   . GERD (gastroesophageal reflux disease)   . Gout   . History of kidney stones   . Hypertension   . Ischemic cardiomyopathy 07/06/2014  . Lupus anticoagulant positive   . Myocardial infarct ([place]) 2000  . Myocardial infarct ([place]) 2016  . S/P drug eluting coronary stent placement, 07/06/14, Promus 07/07/2014  . Sleep apnea    biPaP  . Subarachnoid bleed ([place]) 2015   after falling and hitting his head   Past Surgical History:  Past Surgical History:  Procedure Laterality Date  . COLONOSCOPY    . EP IMPLANTABLE DEVICE [name] 11/27/2014   [employer] Fortify Assura VR  implanted by Dr Rayann Heman for primary prevention  . LEFT HEART CATH AND CORONARY ANGIOGRAPHY [name] 07/29/2018   Procedure: LEFT HEART CATH AND CORONARY ANGIOGRAPHY;  Surgeon: Sherren Mocha, MD;  Location: [facility];  Service: Cardiovascular;  Laterality: [name];  . LEFT HEART CATH AND CORONARY ANGIOGRAPHY [name] 08/14/2019   Procedure: LEFT HEART CATH AND CORONARY ANGIOGRAPHY;  Surgeon: Troy Sine, MD;  Location: [facility];  Service: Cardiovascular;  Laterality: [name];  . LEFT HEART CATHETERIZATION WITH CORONARY ANGIOGRAM [name] 07/06/2014   Procedure: LEFT HEART CATHETERIZATION WITH CORONARY ANGIOGRAM;  Surgeon: Peter M Martinique, MD;  Location: [facility] CATH LAB;  Service: Cardiovascular;  Laterality: [name];  . PERCUTANEOUS CORONARY STENT INTERVENTION (PCI-S) Right 07/06/2014  Procedure: PERCUTANEOUS CORONARY STENT INTERVENTION (PCI-S);  Surgeon: Peter M Martinique, MD;  Location: [facility] CATH [facility];  Service: Cardiovascular;  Laterality: Right;  . SHOULDER ARTHROSCOPY WITH SUBACROMIAL DECOMPRESSION Left 03/05/2016   Procedure: SHOULDER ARTHROSCOPY ROTATOR CUFF REPAIR WITH SUBACROMIAL DECOMPRESSION;  Surgeon: Tania Ade, MD;  Location: [facility];  Service:  Orthopedics;  Laterality: Left;  SHOULDER ARTHROSCOPY ROTATOR CUFF REPAIR WITH SUBACROMIAL DECOMPRESSION  . SHOULDER CLOSED REDUCTION Left 11/29/2015   Procedure: CLOSED REDUCTION SHOULDER;  Surgeon: Tania Ade, MD;  Location: [facility];  Service: Orthopedics;  Laterality: Left;  . TRANSURETHRAL RESECTION OF PROSTATE [name] 02/24/2018   Procedure: TRANSURETHRAL RESECTION OF THE PROSTATE (TURP);  Surgeon: Irine Seal, MD;  Location: [facility];  Service: Urology;  Laterality: [name];  . WRIST SURGERY Bilateral    plates and screws    HPI:  74 yo male s/p fall with R cerebellum SDH SAH R side 2-4 rib fxs and Large R PTX with subcutaneous emphysema Pt with x3 falls in 3 weeks.  PMH OSA CAD DVT HTN arthritis bil catar    Assessment / Plan / Recommendation Clinical Impression  Pt and daughter report mildly increased confusion today. 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 The Cerebellar cognitive affective Schmahmann dynfrome scale was administered and pt scored 42/120. Compounded by his lethargy, challenging areas included divergent naming with semantic and phonemic fluency, category switching, verbal recall, and digit span forward and backward. Verbal language and comprehension intact. Speech minimally dysarthric primarily due to vocal intensity. Recommending ST in acute care and inpatient rehab.      SLP Assessment  SLP Recommendation/Assessment: Patient needs continued Speech Lanaguage Pathology Services SLP Visit Diagnosis: Dysarthria and anarthria (R47.1);Cognitive communication deficit (R41.841)    Follow Up Recommendations  Inpatient Rehab    Frequency and Duration min 2x/week  2 weeks      SLP Evaluation Cognition  Overall Cognitive Status: Impaired/Different from baseline Arousal/Alertness: Awake/alert (little drowsy/tired) Orientation Level: Oriented X4 Attention: Sustained Sustained Attention: Appears intact Memory: Impaired Memory Impairment: Retrieval  deficit;Decreased recall of new information Awareness: Impaired Awareness Impairment: Anticipatory impairment Problem Solving: Impaired Problem Solving Impairment: Functional complex Executive Function: Self Monitoring;Sequencing;Organizing Safety/Judgment: Impaired       Comprehension  Auditory Comprehension Overall Auditory Comprehension: Appears within functional limits for tasks assessed Visual Recognition/Discrimination Discrimination: Not tested Reading Comprehension Reading Status:  (TBA)    Expression Expression Primary Mode of Expression: Verbal Verbal Expression Overall Verbal Expression: Appears within functional limits for tasks assessed Level of Generative/Spontaneous Verbalization: Conversation Naming: Impairment Confrontation: Within functional limits Divergent: 25-49% accurate Pragmatics: No impairment Written Expression Dominant Hand: Right Written Expression:  (TBA)   Oral / Motor  Oral Motor/Sensory Function Overall Oral Motor/Sensory Function: Within functional limits Motor Speech Overall Motor Speech: Impaired Phonation: Low vocal intensity Articulation: Impaired Level of Impairment: Conversation Intelligibility: Intelligibility reduced Word: 75-100% accurate Phrase: 75-100% accurate Sentence: 75-100% accurate Conversation: 75-100% accurate Motor Planning: Witnin functional limits   GO                    Houston Siren 12/05/2019, 1:59 PM   Orbie Pyo Lowanda Cashaw M.Ed Risk analyst [phone] Office [phone]

## 2019-12-05 NOTE — Consult Note (Addendum)
Cardiology Consultation:   Patient ID: VEDDER BRITTIAN MRN: [id number]; DOB: 01-26-1946  Admit date: 12/03/2019 Date of Consult: 12/05/2019  Primary Care Provider: [facility] [place] Cardiologist: Serge Main Martinique, MD  [facility] Electrophysiologist:  Thompson Grayer, MD    Patient Profile:   JENTZEN MINASYAN is a 74 y.o. 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  History of Present Illness:   Mr. Coffield is followed by Dr. Martinique for the above cardiac issues. He was admitted in May 2021 with UA and underwent Myoview stress test which was a high risk study with finding s/w prior MI but no ischemia. He subsequently underwent a heart cath showing 30%pRCA stenosis and 30-35% 30-35% ISR of previously placed p-mLAD stent. He was recommended for ongoing medical management. He was continued on Aspirin, statin, BB, and Entresto.   He was admitted in July 2021 after a fall found to have a subdural hematoma. He was actually off coumadin at that time for a subdural steroid injection. Coumadin and aspirin were held for 10 days and then resumed.   He was last seen in the office 11/29/19 and reported occasional low BPS, down to 80/55. He feels dizzy and lightheaded with this and lethargic. Weight was down 10 lbs. No cardiac symptoms. Entresto was decreased for hypotension.   The patient was admitted to [facility] 12/03/19 for falls. He reported 5 falls that day. He had associated sob, confusion and weakness. He was found to have rib fractures with pneumomediastinum treated with chest tube. Also found moderately large acute SDH with mass effect on the rt cerebellar hemisphere and multifocal intracranial hemorrhage. INR on admission was 3.5 and the patient was given Kcentra to reverse  coumadin. Neurosurgery was consulted who recommended discontinuation of anticoagulation with consideration for an IVC filter. Vascular was consulted who agreed IVC filter might be the best option. Repeat head CT showed stable SDH and slight increase in [facility]. An echo was obtained which showed LVEF 30-35%, inferoapical filling defect by definity contrast that is likely coarse trabeculation of the ventricular wall and is noted to flatten after a PVC making a thrombus less likely. Cardiology was consulted for further recommendations.     Past Medical History:  Diagnosis Date   Arthritis    Bladder stone 10/26/2017   BPH (benign prostatic hyperplasia)    takes Flomax daily   CAD (coronary artery disease)    a. prior LAD stenting;  b. 06/2014 NSTEMI/PCI: LM nl, LAD 30p, 40m ISR (3.0x20 Promus DES), LCX nl, RCA nondom, nl, EF 25-30% ant, apical, dist inf AK.   Cataracts, bilateral    immature   Chronic systolic CHF (congestive heart failure) ([place]) 12/31/2014   DVT (deep venous thrombosis) (HCC)    RECURRENT left leg   Dyslipidemia    GERD (gastroesophageal reflux disease)    Gout    History of kidney stones    Hypertension    Ischemic cardiomyopathy 07/06/2014   Lupus anticoagulant positive    Myocardial infarct ([place]) 2000   Myocardial infarct ([place]) 2016   S/P drug eluting coronary stent placement, 07/06/14, Promus 07/07/2014   Sleep apnea    biPaP   Subarachnoid bleed ([place]) 2015   after falling and hitting his head    Past Surgical  History:  Procedure Laterality Date   COLONOSCOPY     EP IMPLANTABLE DEVICE [name] 11/27/2014   [employer] Fortify Assura VR  implanted by Dr Rayann Heman for primary prevention   LEFT HEART CATH AND CORONARY ANGIOGRAPHY [name] 07/29/2018   Procedure: LEFT HEART CATH AND CORONARY ANGIOGRAPHY;  Surgeon: Sherren Mocha, MD;  Location: [place] [facility];  Service: Cardiovascular;  Laterality: [name];   LEFT HEART CATH AND CORONARY ANGIOGRAPHY [name] 08/14/2019   Procedure:  LEFT HEART CATH AND CORONARY ANGIOGRAPHY;  Surgeon: Troy Sine, MD;  Location: [place] [facility];  Service: Cardiovascular;  Laterality: [name];   LEFT HEART CATHETERIZATION WITH CORONARY ANGIOGRAM [name] 07/06/2014   Procedure: LEFT HEART CATHETERIZATION WITH CORONARY ANGIOGRAM;  Surgeon: Hattie Pine M Martinique, MD;  Location: [facility] CATH LAB;  Service: Cardiovascular;  Laterality: [name];   PERCUTANEOUS CORONARY STENT INTERVENTION (PCI-S) Right 07/06/2014   Procedure: PERCUTANEOUS CORONARY STENT INTERVENTION (PCI-S);  Surgeon: Dequincy Born M Martinique, MD;  Location: [facility] CATH LAB;  Service: Cardiovascular;  Laterality: Right;   SHOULDER ARTHROSCOPY WITH SUBACROMIAL DECOMPRESSION Left 03/05/2016   Procedure: SHOULDER ARTHROSCOPY ROTATOR CUFF REPAIR WITH SUBACROMIAL DECOMPRESSION;  Surgeon: Tania Ade, MD;  Location: [place];  Service: Orthopedics;  Laterality: Left;  SHOULDER ARTHROSCOPY ROTATOR CUFF REPAIR WITH SUBACROMIAL DECOMPRESSION   SHOULDER CLOSED REDUCTION Left 11/29/2015   Procedure: CLOSED REDUCTION SHOULDER;  Surgeon: Tania Ade, MD;  Location: [place];  Service: Orthopedics;  Laterality: Left;   TRANSURETHRAL RESECTION OF PROSTATE [name] 02/24/2018   Procedure: TRANSURETHRAL RESECTION OF THE PROSTATE (TURP);  Surgeon: Irine Seal, MD;  Location: [facility];  Service: Urology;  Laterality: [name];   WRIST SURGERY Bilateral    plates and screws      Home Medications:  Prior to Admission medications   Medication Sig Start Date End Date Taking? Authorizing Provider  allopurinol (ZYLOPRIM) 300 MG tablet Take 150 mg by mouth 2 (two) times daily.  02/27/16  Yes [provider]  ascorbic acid (VITAMIN C) 500 MG tablet Take 500 mg by mouth daily.   Yes [provider]  calcium-vitamin D (OSCAL WITH D) 500-200 MG-UNIT tablet Take 1 tablet by mouth.   Yes [provider]  ezetimibe (ZETIA) 10 MG tablet Take 1 tablet (10 mg total) by mouth daily. 08/15/19  Yes Dorothy Spark, MD  furosemide (LASIX)  20 MG tablet Take 1 tablet (20 mg total) by mouth daily. 11/29/19 02/27/20 Yes Martinique, Amarien Carne M, MD  levETIRAcetam (KEPPRA) 500 MG tablet Take 1 tablet (500 mg total) by mouth 2 (two) times daily. 10/13/19  Yes Seawell, Jaimie A, DO  LIVALO 4 MG TABS Take 4 mg by mouth at bedtime.  09/19/19  Yes [provider]  Multiple Vitamin (ONE-A-DAY MENS PO) Take 1 tablet by mouth daily.   Yes [provider]  nitroGLYCERIN (NITROSTAT) 0.4 MG SL tablet Place 1 tablet (0.4 mg total) under the tongue every 5 (five) minutes x 3 doses as needed for chest pain. 12/08/18  Yes Martinique, Laquanta Hummel M, MD  pantoprazole (PROTONIX) 40 MG tablet Take 40 mg by mouth daily. 09/19/19  Yes [provider]  potassium chloride SA (KLOR-CON) 20 MEQ tablet Take 20 mEq by mouth daily.   Yes [provider]  propranolol (INDERAL) 20 MG tablet Take 20 mg by mouth 2 (two) times daily. 09/19/19  Yes [provider]  sacubitril-valsartan (ENTRESTO) 24-26 MG Take 1 tablet by mouth daily. 11/29/19  Yes Martinique, Debera Sterba M, MD  warfarin (COUMADIN) 5  MG tablet Take 0.5-1 tablets (2.5-5 mg total) by mouth See admin instructions. Take 5mg  on 5/25 and 5/26 then resume home regimen of 2.5mg  daily except 5mg  on Wednesday Patient taking differently: Take 2.5-5 mg by mouth See admin instructions. Take 2.5mg  every day except on Wednesdays take 5 mg per spouse 08/15/19  Yes Dorothy Spark, MD  aspirin 81 MG chewable tablet Chew 1 tablet (81 mg total) by mouth daily. Patient not taking: Reported on 12/03/2019 08/15/19   Dorothy Spark, MD    Inpatient Medications: Scheduled Meds:  acetaminophen  975 mg Oral TID   bethanechol  25 mg Oral TID   Chlorhexidine Gluconate Cloth  6 each Topical Daily   docusate sodium  100 mg Oral BID   polyethylene glycol  17 g Oral Daily   Continuous Infusions:  sodium chloride Stopped (12/03/19 1541)   sodium chloride Stopped (12/03/19 1541)   dextrose 5 % and 0.45% NaCl 50 mL/hr at  12/05/19 1200   levETIRAcetam Stopped (12/05/19 0604)   PRN Meds: sodium chloride, sodium chloride, hydrALAZINE, morphine injection, ondansetron **OR** ondansetron (ZOFRAN) IV, oxyCODONE  Allergies:   No Known Allergies  Social History:   Social History   Socioeconomic History   Marital status: Married    Spouse name: Not on file   Number of children: 6   Years of education: Not on file   Highest education level: Not on file  Occupational History   Not on file  Tobacco Use   Smoking status: Never Smoker   Smokeless tobacco: Never Used  Vaping Use   Vaping Use: Never used  Substance and Sexual Activity   Alcohol use: Yes    Alcohol/week: 0.0 standard drinks    Comment: seldom    Drug use: No   Sexual activity: Yes    Birth control/protection: None  Other Topics Concern   Not on file  Social History Narrative   ** Merged History Encounter **       Pt lives in [place] with spouse.  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   Social Determinants of Health   Financial Resource Strain:    Difficulty of Paying Living Expenses: Not on file  Food Insecurity:    Worried About Charity fundraiser in the Last Year: Not on file   [employer] of Food in the Last Year: Not on file  Transportation Needs:    Lack of Transportation (Medical): Not on file   Lack of Transportation (Non-Medical): Not on file  Physical Activity:    Days of Exercise per Week: Not on file   Minutes of Exercise per Session: Not on file  Stress:    Feeling of Stress : Not on file  Social Connections:    Frequency of Communication with Friends and Family: Not on file   Frequency of Social Gatherings with Friends and Family: Not on file   Attends Religious Services: Not on file   Active Member of Clubs or Organizations: Not on file   Attends Archivist Meetings: Not on file   Marital Status: Not on file  Intimate Partner Violence:    Fear of Current or Ex-Partner: Not on file    Emotionally Abused: Not on file   Physically Abused: Not on file   Sexually Abused: Not on file    Family History:   Family History  Problem Relation Age of Onset   Aneurysm Father      ROS:  Please see the history  of present illness.  All other ROS reviewed and negative.     Physical Exam/Data:   Vitals:   12/05/19 1000 12/05/19 1100 12/05/19 1114 12/05/19 1200  BP: (!) 106/49 99/71  111/66  Pulse: 78     Resp: 16 17  18   Temp:   98.4 F (36.9 C)   TempSrc:   Oral   SpO2: 94%     Weight:      Height:        Intake/Output Summary (Last 24 hours) at 12/05/2019 1224 Last data filed at 12/05/2019 1200 Gross per 24 hour  Intake 2206.1 ml  Output 995 ml  Net 1211.1 ml   Last 3 Weights 12/03/2019 11/29/2019 10/25/2019  Weight (lbs) 205 lb 209 lb 12.8 oz 215 lb  Weight (kg) 92.987 kg 95.165 kg 97.523 kg     Body mass index is 29.41 kg/m.   Slow cognition non focal neuro AICD under left clavicle SEM soft with increase PMI Post chest tube from right tension pneumo   EKG:  The EKG was personally reviewed and demonstrates: sinus tachycardia, 107bpm, PVC, q waves anterior leads Telemetry:  Telemetry was personally reviewed and demonstrates:    Relevant CV Studies:  Echo 12/04/19  1. There is an inferoapical filling defect by Definity contrast that is  likely coarse trabeculation of the ventricular wall and is noted to  flatten after a PVC, making a thrombus less likely. Left ventricular  ejection fraction, by estimation, is 30 to  35%. The left ventricle has moderately decreased function. The left  ventricle demonstrates regional wall motion abnormalities (see scoring  diagram/findings for description). There is mild left ventricular  hypertrophy. Left ventricular diastolic  parameters are consistent with Grade I diastolic dysfunction (impaired  relaxation). There is severe akinesis of the left ventricular, entire  anterior wall, anteroseptal wall, anterior segment and  inferoapical  segment. The apex appears aneurysmal.   2. An AICD is visualized.   3. The mitral valve is abnormal. Trivial mitral valve regurgitation.   4. The aortic valve is tricuspid. Aortic valve regurgitation is mild.  Mild aortic valve sclerosis is present, with no evidence of aortic valve  stenosis. Aortic valve mean gradient measures 3.0 mmHg.    Echo 08/12/19  1. Since the last study on 08/18/2017 LVEf has further decreased from  35-40% to 25-30% with diffuse hypokinesis and apical aneurysmal  dilatation.   2. Left ventricular ejection fraction, by estimation, is 25 to 30%. The  left ventricle has severely decreased function. The left ventricle has no  regional wall motion abnormalities. The left ventricular internal cavity  size was mildly dilated. There is  moderate concentric left ventricular hypertrophy. Left ventricular  diastolic parameters are consistent with Grade I diastolic dysfunction  (impaired relaxation). Elevated left atrial pressure.   3. Right ventricular systolic function is normal. The right ventricular  size is normal. A ICD wire is visualized. There is normal pulmonary artery  systolic pressure. The estimated right ventricular systolic pressure is  13.0 mmHg.   4. The mitral valve is normal in structure. Mild mitral valve  regurgitation. No evidence of mitral stenosis.   5. The aortic valve is normal in structure. Aortic valve regurgitation is  mild. Mild to moderate aortic valve sclerosis/calcification is present,  without any evidence of aortic stenosis.   6. The inferior vena cava is normal in size with greater than 50%  respiratory variability, suggesting right atrial pressure of 3 mmHg.   Cardiac cath 08/12/19 Prox  RCA lesion is 30% stenosed. Prox LAD to Mid LAD lesion is 35% stenosed.   The Left main is a normal vessel and trifurcates into a large LAD, ramus immediate vessel and dominant left circumflex coronary artery.  The LAD stent remains  patent and has residual in-stent narrowing of 30 to 35% not significantly changed from the prior catheterization.  The ramus intermediate and dominant circumflex are angiographically normal.  The RCA is a small nondominant vessel with smooth 30% proximal stenosis.   LVEDP 5 mmHg.   RECOMMENDATION: Medical therapy. Reinitiate warfarin therapy.  GDMT therapy for his reduced LV function.  Laboratory Data:  High Sensitivity Troponin:   Recent Labs  Lab 12/03/19 1036 12/03/19 1305  TROPONINIHS 14 17     Chemistry Recent Labs  Lab 12/03/19 1036 12/04/19 0205 12/05/19 0850  NA 141 142 136  K 3.6 3.8 3.8  CL 104 105 102  CO2 26 28 27   GLUCOSE 121* 112* 127*  BUN 14 11 12   CREATININE 0.96 1.07 1.05  CALCIUM 9.7 9.8 9.4  GFRNONAA >60 >60 >60  GFRAA >60 >60 >60  ANIONGAP 11 9 7     Recent Labs  Lab 12/03/19 1036  PROT 6.8  ALBUMIN 3.4*  AST 18  ALT 12  ALKPHOS 32*  BILITOT 1.5*   Hematology Recent Labs  Lab 12/03/19 1036 12/04/19 0205 12/05/19 0145  WBC 8.6 8.3 7.8  RBC 4.76 4.62 4.21*  HGB 14.6 14.0 12.7*  HCT 43.8 42.3 38.2*  MCV 92.0 91.6 90.7  MCH 30.7 30.3 30.2  MCHC 33.3 33.1 33.2  RDW 14.2 14.3 14.0  PLT 213 198 184   BNPNo results for input(s): BNP, PROBNP in the last 168 hours.  DDimer No results for input(s): DDIMER in the last 168 hours.   Radiology/Studies:  CT ANGIO HEAD W OR WO CONTRAST  Result Date: 12/03/2019 CLINICAL DATA:  Intracranial hemorrhage. Extra-axial hemorrhage along the right cerebellum and inferior to the tentorium. Subarachnoid hemorrhage EXAM: CT ANGIOGRAPHY HEAD AND NECK TECHNIQUE: Multidetector CT imaging of the head and neck was performed using the standard protocol during bolus administration of intravenous contrast. Multiplanar CT image reconstructions and MIPs were obtained to evaluate the vascular anatomy. Carotid stenosis measurements (when applicable) are obtained utilizing NASCET criteria, using the distal internal  carotid diameter as the denominator. CONTRAST:  123mL OMNIPAQUE IOHEXOL 350 MG/ML SOLN COMPARISON:  CT head without contrast 12/03/2019. FINDINGS: CT HEAD FINDINGS CTA NECK FINDINGS Aortic arch: A 3 vessel arch configuration is present. Great vessel origins are within normal limits. Atherosclerotic changes are present in the distal arch. No aneurysm or stenosis is present. No traumatic injury is present. Right carotid system: The right common carotid artery is within normal limits. Bifurcation is unremarkable. Cervical right ICA is normal. Left carotid system: The left common carotid artery is within normal limits. Minimal atherosclerotic changes are noted at the bifurcation without significant stenosis. The cervical left ICA is normal. Vertebral arteries: The right vertebral artery is dominant. Both vertebral arteries originate from the subclavian arteries without significant stenosis. No significant stenosis is present in either vertebral artery in the neck. Skeleton: Degenerative anterolisthesis is present at C4-5. Multilevel degenerative endplate changes are present the lower cervical and upper thoracic spine. No acute fractures are present. Other neck: Extensive subcutaneous emphysema is noted salivary glands and ducts are within normal limits. No significant adenopathy is present. Multinodular thyroid is present. The largest lesion is in the left lobe measuring 2.5 cm. Upper chest: A large  right pneumothorax is present. Right pleural effusion is present. Pneumomediastinum is noted. Anterior right second rib fracture is noted. Review of the MIP images confirms the above findings CTA HEAD FINDINGS Anterior circulation: Atherosclerotic changes are present within the cavernous internal carotid arteries bilaterally without significant stenosis from the skull base through the ICA termini. The A1 and M1 segments are normal. The anterior communicating artery is patent. MCA bifurcations are intact. The ACA and MCA  branch vessels are within normal limits. Posterior circulation: The right vertebral artery is dominant vessel. The PICA origins are visualized and normal. Vertebrobasilar junction is normal. Both posterior cerebral arteries originate from the basilar tip. The PCA branch vessels are within normal limits bilaterally. Venous sinuses: The dural sinuses are patent. The straight sinus and deep cerebral veins are intact. Cortical veins are unremarkable. Anatomic variants: None Review of the MIP images confirms the above findings IMPRESSION: 1. Large right pneumothorax with pneumomediastinum and right pleural effusion. 2. Anterior right second rib fracture. 3. Normal CTA Circle of Willis without significant proximal stenosis, aneurysm, or branch vessel occlusion. The extra-axial hemorrhage is likely posttraumatic. 4. Minimal atherosclerotic changes at the left carotid bifurcation and bilateral cavernous internal carotid arteries without significant stenosis. 5. Extensive subcutaneous emphysema. 6. Multinodular thyroid. The largest lesion is in the left lobe measuring 2.5 cm. Recommend thyroid US (ref: J Am Coll Radiol. 2015 Feb;12(2): 143-50). Electronically Signed   By: San Morelle M.D.   On: 12/03/2019 13:28   CT HEAD WO CONTRAST  Result Date: 12/04/2019 CLINICAL DATA:  Follow-up subarachnoid hemorrhage EXAM: CT HEAD WITHOUT CONTRAST TECHNIQUE: Contiguous axial images were obtained from the base of the skull through the vertex without intravenous contrast. COMPARISON:  CT brain 12/03/2019, 10/25/2019 FINDINGS: Brain: No intracranial mass or territorial infarct is visualized. Moderate atrophy. Stable ventricle size. Mild hypodensity in the white matter consistent with chronic small vessel ischemic change. Grossly stable extra-axial hematoma along the right tentorium measuring approximately 2.9 x 2.1 by 3.3 cm with localized mass effect. Stable patent fourth ventricle. Slight increased subarachnoid blood at the  cerebellar folia. Similar layering blood along the right tentorium. Multifocal subarachnoid hemorrhage involves the parietal, temporal and occipital lobes. Some of the blood appears slightly more dense/conspicuous compared to the prior CT but the overall distribution is grossly unchanged. No midline shift. Vascular: No hyperdense vessels. Scattered carotid vascular calcification. Skull: No fracture. Sinuses/Orbits: No acute finding. Other: Incompletely visualized soft tissue emphysema within the superficial and deep spaces of the neck. IMPRESSION: 1. Grossly stable extra-axial hematoma along the right tentorium with localized mass effect. Layering blood along the right tentorium also grossly unchanged. Slight increased foci of subarachnoid blood at the cerebellar folia. 2. Multifocal subarachnoid hemorrhage within the bilateral parietal, temporal, and occipital lobes. Some of the blood appears slightly more dense/conspicuous compared to the prior CT but the overall distribution is grossly unchanged. 3. Atrophy and chronic small vessel ischemic change of the white matter. 4. Incompletely visualized soft tissue emphysema within the superficial and deep spaces of the neck. Electronically Signed   By: Donavan Foil M.D.   On: 12/04/2019 02:59   CT Head Wo Contrast  Result Date: 12/03/2019 CLINICAL DATA:  Fall this past Wednesday, minor head trauma EXAM: CT HEAD WITHOUT CONTRAST TECHNIQUE: Contiguous axial images were obtained from the base of the skull through the vertex without intravenous contrast. COMPARISON:  Recent prior head CT 10/25/2019 FINDINGS: Brain: Multifocal intracranial hemorrhage. There is a relatively large subdural hematoma layering along the inferior  aspect of the right tentorium cerebellum measuring 2.9 x 2.3 x 3.2 cm (volume = 11 cm^3). Additionally, there are foci subarachnoid hemorrhage layering within the sulci overlying the left cerebral hemisphere, and within the right parietal lobe sulci  and left temporal lobe sulci. The subdural hematoma along the right tentorium exerts mass effect on the underlying cerebellum and also effaces the CSF space around the right hemi pons. No evidence of intraventricular hemorrhage. No evidence of intra or entrapment at this time. Stable cortical and central atrophy with ex vacuo ventriculomegaly of the lateral ventricles. No evidence of acute ischemia. Vascular: No hyperdense vessel or unexpected calcification. Skull: Normal. Negative for fracture or focal lesion. Sinuses/Orbits: No acute finding. Other: Extensive subcutaneous emphysema tracking along the muscular and soft tissue planes of the skull base and upper neck. IMPRESSION: 1. Multifocal intracranial hemorrhage most significantly a moderately large acute subdural hematoma along the inferior surface of the right tentorium cerebellum with local mass effect on the superior aspect of the right cerebellar hemisphere and effacement of the CSF spaces adjacent to the right pons. 2. Additional multifocal scattered foci of subarachnoid hemorrhage bilaterally. 3. Extensive subcutaneous emphysema. Critical Value/emergent results were called by telephone at the time of interpretation on 12/03/2019 at 12:14 pm to provider DAN FLOYD , who verbally acknowledged these results. Electronically Signed   By: Jacqulynn Cadet M.D.   On: 12/03/2019 12:14   CT ANGIO NECK W OR WO CONTRAST  Result Date: 12/03/2019 CLINICAL DATA:  Intracranial hemorrhage. Extra-axial hemorrhage along the right cerebellum and inferior to the tentorium. Subarachnoid hemorrhage EXAM: CT ANGIOGRAPHY HEAD AND NECK TECHNIQUE: Multidetector CT imaging of the head and neck was performed using the standard protocol during bolus administration of intravenous contrast. Multiplanar CT image reconstructions and MIPs were obtained to evaluate the vascular anatomy. Carotid stenosis measurements (when applicable) are obtained utilizing NASCET criteria, using the  distal internal carotid diameter as the denominator. CONTRAST:  172mL OMNIPAQUE IOHEXOL 350 MG/ML SOLN COMPARISON:  CT head without contrast 12/03/2019. FINDINGS: CT HEAD FINDINGS CTA NECK FINDINGS Aortic arch: A 3 vessel arch configuration is present. Great vessel origins are within normal limits. Atherosclerotic changes are present in the distal arch. No aneurysm or stenosis is present. No traumatic injury is present. Right carotid system: The right common carotid artery is within normal limits. Bifurcation is unremarkable. Cervical right ICA is normal. Left carotid system: The left common carotid artery is within normal limits. Minimal atherosclerotic changes are noted at the bifurcation without significant stenosis. The cervical left ICA is normal. Vertebral arteries: The right vertebral artery is dominant. Both vertebral arteries originate from the subclavian arteries without significant stenosis. No significant stenosis is present in either vertebral artery in the neck. Skeleton: Degenerative anterolisthesis is present at C4-5. Multilevel degenerative endplate changes are present the lower cervical and upper thoracic spine. No acute fractures are present. Other neck: Extensive subcutaneous emphysema is noted salivary glands and ducts are within normal limits. No significant adenopathy is present. Multinodular thyroid is present. The largest lesion is in the left lobe measuring 2.5 cm. Upper chest: A large right pneumothorax is present. Right pleural effusion is present. Pneumomediastinum is noted. Anterior right second rib fracture is noted. Review of the MIP images confirms the above findings CTA HEAD FINDINGS Anterior circulation: Atherosclerotic changes are present within the cavernous internal carotid arteries bilaterally without significant stenosis from the skull base through the ICA termini. The A1 and M1 segments are normal. The anterior communicating artery is patent.  MCA bifurcations are intact. The  ACA and MCA branch vessels are within normal limits. Posterior circulation: The right vertebral artery is dominant vessel. The PICA origins are visualized and normal. Vertebrobasilar junction is normal. Both posterior cerebral arteries originate from the basilar tip. The PCA branch vessels are within normal limits bilaterally. Venous sinuses: The dural sinuses are patent. The straight sinus and deep cerebral veins are intact. Cortical veins are unremarkable. Anatomic variants: None Review of the MIP images confirms the above findings IMPRESSION: 1. Large right pneumothorax with pneumomediastinum and right pleural effusion. 2. Anterior right second rib fracture. 3. Normal CTA Circle of Willis without significant proximal stenosis, aneurysm, or branch vessel occlusion. The extra-axial hemorrhage is likely posttraumatic. 4. Minimal atherosclerotic changes at the left carotid bifurcation and bilateral cavernous internal carotid arteries without significant stenosis. 5. Extensive subcutaneous emphysema. 6. Multinodular thyroid. The largest lesion is in the left lobe measuring 2.5 cm. Recommend thyroid US (ref: J Am Coll Radiol. 2015 Feb;12(2): 143-50). Electronically Signed   By: San Morelle M.D.   On: 12/03/2019 13:28   CT CHEST ABDOMEN PELVIS W CONTRAST  Result Date: 12/03/2019 CLINICAL DATA:  Trauma.  Pneumothorax and subcutaneous emphysema. EXAM: CT CHEST, ABDOMEN, AND PELVIS WITH CONTRAST TECHNIQUE: Multidetector CT imaging of the chest, abdomen and pelvis was performed following the standard protocol during bolus administration of intravenous contrast. CONTRAST:  16mL OMNIPAQUE IOHEXOL 350 MG/ML SOLN COMPARISON:  Chest x-ray 12/03/2019 FINDINGS: CT CHEST FINDINGS Cardiovascular: The heart is normal in size. No pericardial effusion. Mild tortuosity and calcification of the thoracic aorta but no aneurysm or dissection. The branch vessels are patent. LAD stent noted. Pacer wires in good position without  complicating features. There is mild shift of the heart and mediastinum to the left side due to a large right pneumothorax. Mediastinum/Nodes: No mediastinal or hilar mass or adenopathy. There is extensive pneumomediastinum. The esophagus is grossly normal. There is a complex left thyroid lobe lesion measuring 2.4 cm. Recommend thyroid US (ref: J Am Coll Radiol. 2015 Feb;12(2): 143-50). Lungs/Pleura: There is a large right-sided pneumothorax estimated at 60%. There is mild shift of the heart mediastinum to the left suggesting a tension pneumothorax. Associated significant right lower lobe atelectasis and small to moderate-sized right pleural effusion. The left lung is clear. Musculoskeletal: There is a displaced right second anterior rib fracture and associated adjacent pleural hematoma. This likely causes the pneumothorax. There also nondisplaced fractures involving the third and fourth ribs. No sternal fractures identified. Suspect remote healed upper sternal fracture. The thoracic vertebral bodies are normally aligned. No acute fracture. There are remote healed fractures involving the left posterior ribs and also a remote fracture of the left humeral head. CT ABDOMEN PELVIS FINDINGS Hepatobiliary: No acute hepatic injury is identified. No perihepatic fluid collections. No hepatic lesions. The gallbladder is normal. No common bile duct dilatation. Pancreas: No mass, inflammation or ductal dilatation. No acute pancreatic injury or peripancreatic fluid collection. Spleen: Normal size. No acute splenic injury or perisplenic fluid collection. Adrenals/Urinary Tract: The adrenal glands and kidneys are unremarkable except for a large simple appearing left renal cyst, right-sided parapelvic renal cysts and a lower pole left renal calculus. No acute renal injury or perirenal fluid collections. There is a Foley catheter noted in the bladder. There is a large bladder calculus measuring 16 mm. Stomach/Bowel: The stomach,  duodenum, small bowel and colon are grossly normal. No acute inflammatory changes, mass lesions or obstructive findings. Vascular/Lymphatic: 4.7 cm infrarenal abdominal aortic aneurysm ending  just above the iliac artery bifurcation. No dissection. Moderate atherosclerotic calcifications. Mild fusiform enlargement of both iliac arteries but no focal aneurysm or dissection. The major venous structures are patent. No mesenteric or retroperitoneal mass, adenopathy or hematoma. Reproductive: The prostate gland and seminal vesicles are grossly normal. Other: No pelvic mass or adenopathy. No free pelvic fluid collections. No inguinal mass or adenopathy. No abdominal wall hernia or subcutaneous lesions. Musculoskeletal: Advanced degenerative lumbar spondylosis with multilevel disc disease and facet disease but no acute lumbar spine fracture. The bony pelvis is intact.  No hip or pelvic fractures. IMPRESSION: 1. Large right-sided pneumothorax estimated at 60% with mild shift of the heart and mediastinum to the left side suggesting a tension pneumothorax. 2. Right-sided rib fractures as discussed above. The displaced right second anterior rib fracture is likely the cause of the pneumothorax. 3. Large amount of pneumomediastinum. 4. No acute abdominal/pelvic findings, mass lesions or adenopathy. No acute abdominal/pelvic injury is identified. 5. 4.7 cm infrarenal abdominal aortic aneurysm. Recommend follow-up every 6 months and vascular consultation. This recommendation follows ACR consensus guidelines: White Paper of the ACR Incidental Findings Committee II on Vascular Findings. J Am Coll Radiol 2013; 10:789-794. 6. 2.4 cm left thyroid lesion. Recommend thyroid US (ref: J Am Coll Radiol. 2015 Feb;12(2): 143-50). 7. Large bladder calculus. These results were called by telephone at the time of interpretation on 12/03/2019 at 1:07 pm to provider DAN FLOYD , who verbally acknowledged these results. Aortic Atherosclerosis  (ICD10-I70.0). Electronically Signed   By: Marijo Sanes M.D.   On: 12/03/2019 13:05   DG CHEST PORT 1 VIEW  Result Date: 12/05/2019 CLINICAL DATA:  Pneumothorax. EXAM: PORTABLE CHEST 1 VIEW COMPARISON:  Yesterday FINDINGS: Right-sided chest tube in place. No visible pneumothorax. Extensive chest wall emphysema that is not clearly changed when accounting for differences in coverage. Cardiomegaly and pulmonary infiltrates. Coronary stenting. Defibrillator lead from the left in stable position. IMPRESSION: No visible pneumothorax. Electronically Signed   By: Monte Fantasia M.D.   On: 12/05/2019 06:58   DG Chest Port 1 View  Result Date: 12/04/2019 CLINICAL DATA:  Pneumothorax. EXAM: PORTABLE CHEST 1 VIEW COMPARISON:  12/03/2019 FINDINGS: Right basilar chest tube appears to have pulled back since the prior study. Side holes appear to remain within the chest cavity. Small right apical pneumothorax again noted. Extensive subcutaneous emphysema in the right chest and neck unchanged. Improved aeration in the lung bases. Left lung remains clear. Negative for heart failure. AICD noted. Left coronary stent noted. IMPRESSION: Pigtail chest tube on the right has pulled back but appears remain in the chest. Small right pneumothorax unchanged. Improvement in bibasilar atelectasis. Electronically Signed   By: Franchot Gallo M.D.   On: 12/04/2019 07:42   DG Chest Portable 1 View  Result Date: 12/03/2019 CLINICAL DATA:  Pneumothorax, post chest tube placement EXAM: PORTABLE CHEST 1 VIEW COMPARISON:  CT 12/03/2019 FINDINGS: Partially coiled right pleural drain projects over the right upper lung. Question a trace residual right apical pneumothorax. Opacity in the right mid to lower lung lung base which could reflect residual atelectasis or some mild re-expansion edema or underlying consolidation. Additional atelectatic changes present in the left lung. Subcutaneous emphysema seen across the right chest wall and base of  the neck bilaterally. Small volume pneumomediastinum remains. AICD battery pack projects over the left chest wall with lead in stable position towards the cardiac apex. Coronary stent is noted. Remaining cardiomediastinal contours are unremarkable. Rib fractures are better seen on comparison radiograph.  IMPRESSION: 1. Question a trace residual right apical pneumothorax despite the placement of a right chest tube partially coiled in right upper lung. 2. Opacity in the right mid to lower lung base which could reflect residual atelectasis, some mild re-expansion edema or underlying consolidation. 3. Extensive subcutaneous emphysema across the right chest wall and base of the neck bilaterally. 4. Persistent pneumomediastinum. 5. Multiple right rib fractures better seen on CT. Electronically Signed   By: Lovena Le M.D.   On: 12/03/2019 15:01   DG Chest Portable 1 View  Result Date: 12/03/2019 CLINICAL DATA:  Shortness of breath, urinary retention, laps lung, CHF, ischemic cardiomyopathy, coronary artery disease post MI, hypertension EXAM: PORTABLE CHEST 1 VIEW COMPARISON:  Portable exam 1007 hours compared to 08/11/2019 FINDINGS: LEFT subclavian ICD lead projects over RIGHT ventricle. Normal heart size post coronary stenting. Mediastinal contours and pulmonary vascularity normal. Asymmetric lucency of the RIGHT hemithorax at least in part due to superimposed chest wall emphysema, which extends into the cervical regions bilaterally. Small loculated pneumothorax identified at medial RIGHT upper lobe. Subsegmental atelectasis at bases. Remaining lungs clear without pulmonary infiltrate or pleural effusion. Bones demineralized. IMPRESSION: Significant RIGHT chest wall emphysema extending into the cervical regions bilaterally. Small loculated pneumothorax at medial RIGHT upper lobe. Critical Value/emergent results were called by telephone at the time of interpretation on 12/03/2019 at 10:33 am to provider DAN FLOYD MD,  who verbally acknowledged these results. Electronically Signed   By: Lavonia Dana M.D.   On: 12/03/2019 10:34   ECHOCARDIOGRAM COMPLETE  Result Date: 12/04/2019    ECHOCARDIOGRAM REPORT   Patient Name:   SIRE POET Date of Exam: 12/04/2019 Medical Rec #:  [id number]       Height:       70.0 in Accession #:    [id number]      Weight:       205.0 lb Date of Birth:  03-29-1945       BSA:          2.109 m Patient Age:    26 years        BP:           99/53 mmHg Patient Gender: M               HR:           60 bpm. Exam Location:  Inpatient Procedure: 2D Echo, Intracardiac Opacification Agent, Color Doppler and Cardiac            Doppler Indications:    History of congestive heart disease  History:        Patient has prior history of Echocardiogram examinations, most                 recent 08/12/2019. CHF, CAD and Previous Myocardial Infarction,                 Defibrillator; Risk Factors:Dyslipidemia and Sleep Apnea. H/O                 DVT. Ischemic cardiomyopathy.  Sonographer:    Clayton Lefort RDCS (AE) Referring Phys: [id number] [facility]  Sonographer Comments: Suboptimal parasternal window and no subcostal window. IMPRESSIONS  1. There is an inferoapical filling defect by Definity contrast that is likely coarse trabeculation of the ventricular wall and is noted to flatten after a PVC, making a thrombus less likely. Left ventricular ejection fraction, by estimation, is 30 to 35%. The left ventricle has moderately decreased function.  The left ventricle demonstrates regional wall motion abnormalities (see scoring diagram/findings for description). There is mild left ventricular hypertrophy. Left ventricular diastolic parameters are consistent with Grade I diastolic dysfunction (impaired relaxation). There is severe akinesis of the left ventricular, entire anterior wall, anteroseptal wall, anterior segment and inferoapical segment. The apex appears aneurysmal.  2. An AICD is visualized.  3. The mitral valve is  abnormal. Trivial mitral valve regurgitation.  4. The aortic valve is tricuspid. Aortic valve regurgitation is mild. Mild aortic valve sclerosis is present, with no evidence of aortic valve stenosis. Aortic valve mean gradient measures 3.0 mmHg. Comparison(s): Changes from prior study are noted. 08/12/2019: LVEF 25-30%, diffuse HK and apical aneurysm. FINDINGS  Left Ventricle: There is an inferoapical filling defect by Definity contrast that is likely coarse trabeculation of the ventricular wall and is noted to flatten after a PVC, making a thrombus less likely. Left ventricular ejection fraction, by estimation, is 30 to 35%. The left ventricle has moderately decreased function. The left ventricle demonstrates regional wall motion abnormalities. Severe akinesis of the left ventricular, entire anterior wall, anteroseptal wall, anterior segment and inferoapical segment. Definity contrast agent was given IV to delineate the left ventricular endocardial borders. The left ventricular internal cavity size was normal in size. There is mild left ventricular hypertrophy. Left ventricular diastolic parameters are consistent with Grade I diastolic dysfunction (impaired relaxation). Indeterminate filling pressures. Right Ventricle: The right ventricular size is normal. No increase in right ventricular wall thickness. Right ventricular systolic function is normal. Left Atrium: Left atrial size was normal in size. Right Atrium: Right atrial size was normal in size. Pericardium: There is no evidence of pericardial effusion. Mitral Valve: The mitral valve is abnormal. There is mild thickening of the mitral valve leaflet(s). There is mild calcification of the mitral valve leaflet(s). Trivial mitral valve regurgitation. MV peak gradient, 2.7 mmHg. The mean mitral valve gradient is 1.0 mmHg. Tricuspid Valve: The tricuspid valve is grossly normal. Tricuspid valve regurgitation is mild. Aortic Valve: The aortic valve is tricuspid. Aortic  valve regurgitation is mild. Aortic regurgitation PHT measures 354 msec. Mild aortic valve sclerosis is present, with no evidence of aortic valve stenosis. Aortic valve mean gradient measures 3.0 mmHg. Aortic valve peak gradient measures 5.0 mmHg. Aortic valve area, by VTI measures 1.91 cm. Pulmonic Valve: The pulmonic valve was grossly normal. Pulmonic valve regurgitation is not visualized. Aorta: The aortic root and ascending aorta are structurally normal, with no evidence of dilitation. IAS/Shunts: No atrial level shunt detected by color flow Doppler. Additional Comments: A n AICD is noted is visualized.  LEFT VENTRICLE PLAX 2D LVIDd:         5.20 cm  Diastology LVIDs:         3.90 cm  LV e' medial:    5.11 cm/s LV PW:         1.10 cm  LV E/e' medial:  11.4 LV IVS:        1.30 cm  LV e' lateral:   7.51 cm/s LVOT diam:     1.90 cm  LV E/e' lateral: 7.8 LV SV:         42 LV SV Index:   20 LVOT Area:     2.84 cm  RIGHT VENTRICLE RV Basal diam:  3.20 cm RV S prime:     11.20 cm/s TAPSE (M-mode): 3.1 cm LEFT ATRIUM             Index  RIGHT ATRIUM           Index LA diam:        2.50 cm 1.19 cm/m  RA Area:     14.90 cm LA Vol (A2C):   34.5 ml 16.36 ml/m RA Volume:   35.10 ml  16.64 ml/m LA Vol (A4C):   51.3 ml 24.32 ml/m LA Biplane Vol: 45.2 ml 21.43 ml/m  AORTIC VALVE AV Area (Vmax):    2.06 cm AV Area (Vmean):   1.95 cm AV Area (VTI):     1.91 cm AV Vmax:           112.00 cm/s AV Vmean:          83.200 cm/s AV VTI:            0.220 m AV Peak Grad:      5.0 mmHg AV Mean Grad:      3.0 mmHg LVOT Vmax:         81.50 cm/s LVOT Vmean:        57.300 cm/s LVOT VTI:          0.148 m LVOT/AV VTI ratio: 0.67 AI PHT:            354 msec  AORTA Ao Root diam: 3.70 cm MITRAL VALVE               TRICUSPID VALVE MV Area (PHT): 2.73 cm    TR Peak grad:   20.8 mmHg MV Peak grad:  2.7 mmHg    TR Vmax:        228.00 cm/s MV Mean grad:  1.0 mmHg MV Vmax:       0.81 m/s    SHUNTS MV Vmean:      44.8 cm/s   Systemic VTI:   0.15 m MV Decel Time: 278 msec    Systemic Diam: 1.90 cm MV E velocity: 58.30 cm/s MV A velocity: 70.30 cm/s MV E/A ratio:  0.83 Lyman Bishop MD Electronically signed by Lyman Bishop MD Signature Date/Time: 12/04/2019/6:48:21 PM    Final      Assessment and Plan:   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 - Previous echo 08/12/19 showed EF 25-30%, G1DD, and no mention of a thrombus - patient previously on coumadin for history of DVT now plan for discontinuation of coumadin given SDH and fall risk - Will review echo results with MD  Recurrent falls/SDH/SAH - on presentation coumadin reversed.  - Repeat CTH showed stable SDH and slightly increased [facility] - Neurosurgery consulted and recommended definite discontinuation of coumadin and consider IVC filter  Rt sided PTX - 2/2 to fall - s/p chest tube - per primary team  HTN - PTA entresto 24-26mg  BID, propranolol 20 mg daily, lasix>>all held for soft pressures - Pressures slowly improving - Suspect patient will not likely be able to tolerate addition of home meds with history of falls  CHF - Echo as above. He has a known history of low EF down to 25%  - no signs of volume overload - lasix held for soft Bps  CAD  - Cath in 07/2019 showing pRCA 305 stenosis and prox LAD to mid LAD with 35% stenosis - Aspirin held given acute SAH - BB held for hypotension  H/o of DVT - vascular following, considering IVC filter  For questions or  updates, please contact [place] Please consult [email or web address] for contact info under    Signed, Cadence Arlyss Repress  12/05/2019 12:24 PM  Patient examined chart reviewed. Extensive review of echo done today. Discussed care with patient daughter and PA Exam with poor cognition Just finished PT. No focal deficit. AICD under left  clavicle SEM with increased PMI. Post chest Tube on right side for tension pneumothorax. 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 I/O's positive 1211 cc. Not clear of indication for coumadin with very distant DVT. Coumadin reversed On admission with significant SDH. Lives with wife at home Has been seen by [facility] Dr Donnetta Hutching regarding possible IVC filter Echo does not show apical thrombus. There is artifact / shadowing and swirling artifact with late definity images due to cardia Motion and degradation of bubbles overtime. His anterior MI was old and he was on chronic coumadin so clinically unlikely  To have developed. He is tachycardic increased risk angina and with excess volume and re expanded right lung at increased Risk of CHF. Will give low dose lasix today iv and restart low dose coreg. Continue to hold entresto but resume next 48 hours As BP continues to improve.   Jenkins Rouge MD [facility]

## 2019-12-05 NOTE — Consult Note (Signed)
[facility] [place]  Patient name: Kevin Hart MRN: [id number] DOB: 1945/06/08 Sex: male   HPI: Kevin Hart is a 74 y.o. male seen in consultation for potential IVC filter placement.  The patient also has a incidental finding of infrarenal abdominal aortic aneurysm.  He has a very complex past medical history.  He is currently admitted with tension pneumothorax and subdural hematoma.  He has history of prior DVT.  The exact details of this are not available in his medical records.  He is alert and oriented and appears to be a very good historian.  He reports that he had a single left leg DVT 20 years ago.  He denies any prior history of pulmonary embolus.  He has been on long-term anticoagulant due to his history of DVT.  There is a history of positive lupus anticoagulant but I do not have access to any of these records.  It does appear that in the past he has been on Eliquis and is most recently been on chronic Coumadin therapy.  Neurosurgery has seen him in consultation for his subdural hematoma and has recommended permanent discontinuation of all anticoagulation.  We have been consulted for vena cava filter placement.  He does have a history of prior coronary stent placement and also AICD placement.  Past Medical History:  Diagnosis Date  . Arthritis   . Bladder stone 10/26/2017  . BPH (benign prostatic hyperplasia)    takes Flomax daily  . CAD (coronary artery disease)    a. prior LAD stenting;  b. 06/2014 NSTEMI/PCI: LM nl, LAD 30p, 30m ISR (3.0x20 Promus DES), LCX nl, RCA nondom, nl, EF 25-30% ant, apical, dist inf AK.  . Cataracts, bilateral    immature  . Chronic systolic CHF (congestive heart failure) ([place]) 12/31/2014  . DVT (deep venous thrombosis) ([facility])    RECURRENT left leg  . Dyslipidemia   . GERD (gastroesophageal reflux disease)   . Gout   . History of kidney stones   . Hypertension   . Ischemic  cardiomyopathy 07/06/2014  . Lupus anticoagulant positive   . Myocardial infarct ([place]) 2000  . Myocardial infarct ([place]) 2016  . S/P drug eluting coronary stent placement, 07/06/14, Promus 07/07/2014  . Sleep apnea    biPaP  . Subarachnoid bleed ([place]) 2015   after falling and hitting his head    Family History  Problem Relation Age of Onset  . Aneurysm Father     SOCIAL HISTORY: Social History   Tobacco Use  . Smoking status: Never Smoker  . Smokeless tobacco: Never Used  Substance Use Topics  . Alcohol use: Yes    Alcohol/week: 0.0 standard drinks    Comment: seldom     No Known Allergies  Current Facility-Administered Medications  Medication Dose Route Frequency Provider Last Rate Last Admin  . 0.9 %  sodium chloride infusion   Intravenous PRN Deno Etienne, DO   Stopped at 12/03/19 1541  . 0.9 %  sodium chloride infusion   Intravenous PRN Deno Etienne, DO   Stopped at 12/03/19 1541  . acetaminophen (TYLENOL) tablet 975 mg  975 mg Oral TID Daiva Huge, MD   975 mg at 12/05/19 0959  . bethanechol (URECHOLINE) tablet 25 mg  25 mg Oral  TID Jillyn Ledger, PA-C   25 mg at 12/05/19 0955  . Chlorhexidine Gluconate Cloth 2 % PADS 6 each  6 each Topical Daily Ashok Pall, MD   6 each at 12/05/19 0955  . dextrose 5 %-0.45 % sodium chloride infusion   Intravenous Continuous Kinsinger, Arta Bruce, MD 50 mL/hr at 12/04/19 0400 Rate Verify at 12/04/19 0400  . docusate sodium (COLACE) capsule 100 mg  100 mg Oral BID Kinsinger, Arta Bruce, MD   100 mg at 12/05/19 0958  . hydrALAZINE (APRESOLINE) tablet 10 mg  10 mg Oral Q6H PRN Meuth, Brooke A, PA-C      . levETIRAcetam (KEPPRA) IVPB 500 mg/100 mL premix  500 mg Intravenous Q12H Kinsinger, Arta Bruce, MD   Stopped at 12/05/19 0604  . morphine 2 MG/ML injection 2 mg  2 mg Intravenous Q2H PRN Maczis, Barth Kirks, PA-C      . ondansetron (ZOFRAN-ODT) disintegrating tablet 4 mg  4 mg Oral Q6H PRN Kinsinger, Arta Bruce, MD       Or  .  ondansetron [facility]) injection 4 mg  4 mg Intravenous Q6H PRN Kinsinger, Arta Bruce, MD      . oxyCODONE (Oxy IR/ROXICODONE) immediate release tablet 5-10 mg  5-10 mg Oral Q4H PRN Jillyn Ledger, PA-C   5 mg at 12/04/19 1825  . polyethylene glycol (MIRALAX / GLYCOLAX) packet 17 g  17 g Oral Daily Jillyn Ledger, PA-C   17 g at 12/05/19 8882    REVIEW OF SYSTEMS:  [X]  denotes positive finding, [ ]  denotes negative finding Cardiac  Comments:  Chest pain or chest pressure:    Shortness of breath upon exertion: x   Short of breath when lying flat:    Irregular heart rhythm:        Vascular    Pain in calf, thigh, or hip brought on by ambulation:    Pain in feet at night that wakes you up from your sleep:     Blood clot in your veins:    Leg swelling:  x         PHYSICAL EXAM: Vitals:   12/05/19 0750 12/05/19 0800 12/05/19 0900 12/05/19 1000  BP:  (!) 112/57  (!) 106/49  Pulse:  80 85 78  Resp:  15 14 16   Temp: 97.6 F (36.4 C)     TempSrc: Oral     SpO2:  96% 94% 94%  Weight:      Height:        GENERAL: The patient is a well-nourished male, in no acute distress. The vital signs are documented above. CARDIOVASCULAR: Palpable radial pulses.  He has a 2+ left anterior tibial and 2+ right posterior tibial pulse.  He does not have any swelling in his right or left leg.  He does have some hemosiderin deposits around his medial left ankle suggesting chronic venous stasis disease. PULMONARY: There is good air exchange  MUSCULOSKELETAL: There are no major deformities or cyanosis. NEUROLOGIC: No focal weakness or paresthesias are detected. SKIN: There are no ulcers or rashes noted. PSYCHIATRIC: The patient has a normal affect.  DATA:  The patient does have a prior lower extremity venous duplex in our system from 10/26/2017.  At that time he had no evidence of acute or chronic DVT  CT scan also during this admission reveals an infrarenal abdominal aortic aneurysm with a maximal  diameter of 4.7 cm.  CT scan in 2019 showed maximal diameter of 4.1 cm  MEDICAL  ISSUES: #1  Moderate size incidental finding of abdominal aortic aneurysm at 4.7 cm.  Would recommend ultrasound in 1 year to rule out progression.  He is a stent graft candidate based on his current CT scan should he have enlargement.  Would consider repair if it reaches 5.5 cm and his medical condition is stabilized  #2 history of prior DVT and lupus anticoagulant positive.  According to the patient this is 20 years ago with left leg only and no pulmonary embolus.  I agree with discontinuation of anticoagulation with his risk of subdural hematoma with frequent recent falls.  It would be very straightforward to place a vena cava filter.  He had a normal duplex 3 years ago with no suggestion of chronic disease in femoral vein access.  I did discuss with the patient that it may be reasonable to discontine anticoagulation and not place a filter.  Explained that the filter would not prevent recurrent DVT but would hopefully prevent pulmonary embolus.  We have taken the liberty of consulting hematology for an opinion on this as well.  Can place him on the schedule for filter placement at any time if there is agreement that this would be appropriate.  Appreciate hematology's evaluation of this nice patient.    Rosetta Posner, MD FACS [facility] Tel [phone] Pager [phone]

## 2019-12-05 NOTE — Progress Notes (Signed)
Patient presenting with chills temp is 99.7 and has taken scheduled tylenol. Romana Juniper  PA informed

## 2019-12-05 NOTE — Consult Note (Addendum)
[place]  [phone] [phone]    INITIAL HEMATOLOGY CONSULTATION  Referring MD:  Dr. Sherren Mocha Early  Reason for Referral: History of left lower extremity DVT, lupus anticoagulant positive, anticoagulation recommendations  HPI: Mr. Culp is a 74 year old male with a complex medical history including OSA, CAD status post PCI, chronic combined heart failure, hypertension, ICM status post [facility] single heart chamber ICD 2016, essential tremor, history of left lower extremity DVT approximately 20 years ago who has remained on Coumadin, and positive lupus anticoagulant.  The patient presented to the hospital following a fall.  The fall happened about 5 days prior to admission.  The patient has had multiple falls in the past.  CT of the head showed multifocal intracranial hemorrhage most significantly a moderately large acute subdural hematoma along the inferior surface of the right tentorium cerebellum with local mass-effect on the superior aspect of the right cerebellar hemisphere and effacement of the CSF spaces adjacent to the right pons, additional multifocal scattered foci of subarachnoid hemorrhage bilaterally.  CT chest, abdomen, pelvis showed a large right-sided pneumothorax estimated at 60% with mild shift of the heart and mediastinum to the left suggesting a tension pneumothorax, right-sided rib fractures, large amount of pneumomediastinum.  His Coumadin was reversed with Kcentra on 9/12.  Coumadin has remained on hold.  Chest tube was placed for his right pneumothorax.  Neurosurgery has recommended that he not go back on anticoagulation due to SDH/SAH and recurrent falls.  IVC filter has been recommended.  When seen today, the patient is working with physical therapy.  His daughter is at the bedside.  I was able to speak with his wife by phone.  The patient, daughter, and wife all indicate that he has had only one DVT in the left leg which was  approximately 20 years ago.  He has been maintained on anticoagulation ever since then.  When asked about lupus anticoagulant positivity, patient and family members are unclear about where he was diagnosed with this.  PCP (Dr. Holley Raring [facility]) has been managing his warfarin.  He does not recall ever seeing hematologist in the past.  Today, the patient denies headaches, dizziness.  Denies chest pain, shortness of breath.  Denies abdominal pain, nausea, vomiting.  He denies any bleeding.  He remains very weak overall.  His wife expresses concern that she has seen him decline fairly rapidly over the past handful of months.  She states that she has had discussions with his PCP regarding shifting to hospice.  Hematology was asked see the patient to make recommendations regarding anticoagulation.  Past Medical History:  Diagnosis Date  . Arthritis   . Bladder stone 10/26/2017  . BPH (benign prostatic hyperplasia)    takes Flomax daily  . CAD (coronary artery disease)    a. prior LAD stenting;  b. 06/2014 NSTEMI/PCI: LM nl, LAD 30p, 39m ISR (3.0x20 Promus DES), LCX nl, RCA nondom, nl, EF 25-30% ant, apical, dist inf [place].  . Cataracts, bilateral    immature  . Chronic systolic CHF (congestive heart failure) ([place]) 12/31/2014  . DVT (deep venous thrombosis) ([facility])    RECURRENT left leg  . Dyslipidemia   . GERD (gastroesophageal reflux disease)   . Gout   . History of kidney stones   . Hypertension   . Ischemic cardiomyopathy 07/06/2014  . Lupus anticoagulant positive   . Myocardial infarct ([place]) 2000  . Myocardial infarct ([place]) 2016  . S/P drug eluting coronary stent  placement, 07/06/14, Promus 07/07/2014  . Sleep apnea    biPaP  . Subarachnoid bleed ([place]) 2015   after falling and hitting his head  :    Past Surgical History:  Procedure Laterality Date  . COLONOSCOPY    . EP IMPLANTABLE DEVICE [name] 11/27/2014   [employer] Jude [employer] Fortify Assura VR  implanted by Dr Rayann Heman for primary  prevention  . LEFT HEART CATH AND CORONARY ANGIOGRAPHY [name] 07/29/2018   Procedure: LEFT HEART CATH AND CORONARY ANGIOGRAPHY;  Surgeon: Sherren Mocha, MD;  Location: [place] [facility];  Service: Cardiovascular;  Laterality: [name];  . LEFT HEART CATH AND CORONARY ANGIOGRAPHY [name] 08/14/2019   Procedure: LEFT HEART CATH AND CORONARY ANGIOGRAPHY;  Surgeon: Troy Sine, MD;  Location: [place] [facility];  Service: Cardiovascular;  Laterality: [name];  . LEFT HEART CATHETERIZATION WITH CORONARY ANGIOGRAM [name] 07/06/2014   Procedure: LEFT HEART CATHETERIZATION WITH CORONARY ANGIOGRAM;  Surgeon: Peter M Martinique, MD;  Location: [facility] CATH LAB;  Service: Cardiovascular;  Laterality: [name];  . PERCUTANEOUS CORONARY STENT INTERVENTION (PCI-S) Right 07/06/2014   Procedure: PERCUTANEOUS CORONARY STENT INTERVENTION (PCI-S);  Surgeon: Peter M Martinique, MD;  Location: [facility] CATH LAB;  Service: Cardiovascular;  Laterality: Right;  . SHOULDER ARTHROSCOPY WITH SUBACROMIAL DECOMPRESSION Left 03/05/2016   Procedure: SHOULDER ARTHROSCOPY ROTATOR CUFF REPAIR WITH SUBACROMIAL DECOMPRESSION;  Surgeon: Tania Ade, MD;  Location: [place];  Service: Orthopedics;  Laterality: Left;  SHOULDER ARTHROSCOPY ROTATOR CUFF REPAIR WITH SUBACROMIAL DECOMPRESSION  . SHOULDER CLOSED REDUCTION Left 11/29/2015   Procedure: CLOSED REDUCTION SHOULDER;  Surgeon: Tania Ade, MD;  Location: [place];  Service: Orthopedics;  Laterality: Left;  . TRANSURETHRAL RESECTION OF PROSTATE [name] 02/24/2018   Procedure: TRANSURETHRAL RESECTION OF THE PROSTATE (TURP);  Surgeon: Irine Seal, MD;  Location: [facility];  Service: Urology;  Laterality: [name];  . WRIST SURGERY Bilateral    plates and screws   :   CURRENT MEDS: Current Facility-Administered Medications  Medication Dose Route Frequency Provider Last Rate Last Admin  . 0.9 %  sodium chloride infusion   Intravenous PRN Deno Etienne, DO   Stopped at 12/03/19 1541  . 0.9 %  sodium chloride infusion   Intravenous PRN  Deno Etienne, DO   Stopped at 12/03/19 1541  . acetaminophen (TYLENOL) tablet 975 mg  975 mg Oral TID Daiva Huge, MD   975 mg at 12/05/19 0959  . bethanechol (URECHOLINE) tablet 25 mg  25 mg Oral TID Jillyn Ledger, PA-C   25 mg at 12/05/19 0955  . Chlorhexidine Gluconate Cloth 2 % PADS 6 each  6 each Topical Daily Ashok Pall, MD   6 each at 12/05/19 0955  . dextrose 5 %-0.45 % sodium chloride infusion   Intravenous Continuous Kinsinger, Arta Bruce, MD 50 mL/hr at 12/05/19 1200 Rate Verify at 12/05/19 1200  . docusate sodium (COLACE) capsule 100 mg  100 mg Oral BID Kinsinger, Arta Bruce, MD   100 mg at 12/05/19 0958  . hydrALAZINE (APRESOLINE) tablet 10 mg  10 mg Oral Q6H PRN Meuth, Brooke A, PA-C      . levETIRAcetam (KEPPRA) IVPB 500 mg/100 mL premix  500 mg Intravenous Q12H Kinsinger, Arta Bruce, MD   Stopped at 12/05/19 0604  . morphine 2 MG/ML injection 2 mg  2 mg Intravenous Q2H PRN Maczis, Barth Kirks, PA-C      . ondansetron (ZOFRAN-ODT) disintegrating tablet 4 mg  4 mg Oral Q6H PRN Kinsinger, Arta Bruce, MD  Or  . ondansetron (ZOFRAN) injection 4 mg  4 mg Intravenous Q6H PRN Kinsinger, Arta Bruce, MD      . oxyCODONE (Oxy IR/ROXICODONE) immediate release tablet 5-10 mg  5-10 mg Oral Q4H PRN Jillyn Ledger, PA-C   5 mg at 12/04/19 1825  . polyethylene glycol (MIRALAX / GLYCOLAX) packet 17 g  17 g Oral Daily Jillyn Ledger, PA-C   17 g at 12/05/19 6295      No Known Allergies:  Family History  Problem Relation Age of Onset  . Aneurysm Father   :  Social History   Socioeconomic History  . Marital status: Married    Spouse name: Not on file  . Number of children: 6  . Years of education: Not on file  . Highest education level: Not on file  Occupational History  . Not on file  Tobacco Use  . Smoking status: Never Smoker  . Smokeless tobacco: Never Used  Vaping Use  . Vaping Use: Never used  Substance and Sexual Activity  . Alcohol use: Yes     Alcohol/week: 0.0 standard drinks    Comment: seldom   . Drug use: No  . Sexual activity: Yes    Birth control/protection: None  Other Topics Concern  . Not on file  Social History Narrative   ** Merged History Encounter **       Pt lives in [place] with spouse.  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   Social Determinants of Health   Financial Resource Strain:   . Difficulty of Paying Living Expenses: Not on file  Food Insecurity:   . Worried About Charity fundraiser in the Last Year: Not on file  . Ran Out of Food in the Last Year: Not on file  Transportation Needs:   . Lack of Transportation (Medical): Not on file  . Lack of Transportation (Non-Medical): Not on file  Physical Activity:   . Days of Exercise per Week: Not on file  . Minutes of Exercise per Session: Not on file  Stress:   . Feeling of Stress : Not on file  Social Connections:   . Frequency of Communication with Friends and Family: Not on file  . Frequency of Social Gatherings with Friends and Family: Not on file  . Attends Religious Services: Not on file  . Active Member of Clubs or Organizations: Not on file  . Attends Archivist Meetings: Not on file  . Marital Status: Not on file  Intimate Partner Violence:   . Fear of Current or Ex-Partner: Not on file  . Emotionally Abused: Not on file  . Physically Abused: Not on file  . Sexually Abused: Not on file  :  REVIEW OF SYSTEMS:  A comprehensive 14 point review of systems was negative except as noted in the HPI.    Exam: Patient Vitals for the past 24 hrs:  BP Temp Temp src Pulse Resp SpO2  12/05/19 1200 111/66 -- -- -- 18 --  12/05/19 1114 -- 98.4 F (36.9 C) Oral -- -- --  12/05/19 1100 99/71 -- -- -- 17 --  12/05/19 1000 (!) 106/49 -- -- 78 16 94 %  12/05/19 0900 -- -- -- 85 14 94 %  12/05/19 0800 (!) 112/57 -- -- 80 15 96 %  12/05/19 0750 -- 97.6 F (36.4 C) Oral -- -- --  12/05/19 0700 (!) 94/57 -- -- 64 10 95 %   12/05/19 0600 (!) 81/52 -- --  63 18 97 %  12/05/19 0500 (!) 92/56 -- -- 64 16 97 %  12/05/19 0420 (!) 90/58 -- -- 67 (!) 6 92 %  12/05/19 0400 -- -- -- 70 10 95 %  12/05/19 0345 92/61 -- -- 66 12 94 %  12/05/19 0300 -- -- -- 65 15 92 %  12/05/19 0200 (!) 126/115 -- -- 70 18 94 %  12/05/19 0100 (!) 99/55 -- -- 71 (!) 9 92 %  12/05/19 0000 (!) 98/59 -- -- 65 10 96 %  12/04/19 2300 101/68 -- -- 74 17 97 %  12/04/19 2200 110/67 -- -- 73 (!) 25 97 %  12/04/19 2100 (!) 106/59 -- -- 74 (!) 9 98 %  12/04/19 2000 (!) 108/57 -- -- 71 (!) 6 97 %  12/04/19 1947 -- 97.7 F (36.5 C) Oral -- -- --  12/04/19 1900 (!) 78/43 -- -- 70 14 95 %  12/04/19 1825 -- -- -- 83 16 95 %  12/04/19 1822 (!) 114/51 -- -- 80 16 95 %  12/04/19 1800 -- -- -- 79 18 97 %  12/04/19 1700 (!) 106/57 -- -- 75 14 97 %  12/04/19 1600 (!) 99/53 -- -- 60 (!) 9 96 %  12/04/19 1545 -- 98.2 F (36.8 C) Axillary -- -- --  12/04/19 1521 100/64 -- -- 65 13 97 %  12/04/19 1500 -- -- -- 61 11 97 %  12/04/19 1411 116/74 -- -- 87 20 96 %  12/04/19 1400 -- -- -- 78 13 97 %  12/04/19 1300 -- -- -- 91 18 95 %    General: Chronically ill-appearing male, no distress Eyes:  no scleral icterus.   ENT:  There were no oropharyngeal lesions.    Respiratory: lungs were clear bilaterally without wheezing or crackles, right chest tube in place.   Cardiovascular:  Regular rate and rhythm, S1/S2, without murmur, rub or gallop.  There was no pedal edema.   GI:  abdomen was soft, flat, nontender, nondistended, without organomegaly.   Musculoskeletal:  no spinal tenderness of palpation of vertebral spine.   Skin: Scattered ecchymoses.   Neuro exam was nonfocal.  Patient was alert and oriented.    LABS:  Lab Results  Component Value Date   WBC 7.8 12/05/2019   HGB 12.7 (L) 12/05/2019   HCT 38.2 (L) 12/05/2019   PLT 184 12/05/2019   GLUCOSE 127 (H) 12/05/2019   CHOL 169 08/12/2019   TRIG 118 08/12/2019   HDL 39 (L) 08/12/2019   LDLCALC  106 (H) 08/12/2019   ALT 12 12/03/2019   AST 18 12/03/2019   NA 136 12/05/2019   K 3.8 12/05/2019   CL 102 12/05/2019   CREATININE 1.05 12/05/2019   BUN 12 12/05/2019   CO2 27 12/05/2019   INR 1.3 (H) 12/05/2019   HGBA1C 5.4 08/15/2019    CT ANGIO HEAD W OR WO CONTRAST  Result Date: 12/03/2019 CLINICAL DATA:  Intracranial hemorrhage. Extra-axial hemorrhage along the right cerebellum and inferior to the tentorium. Subarachnoid hemorrhage EXAM: CT ANGIOGRAPHY HEAD AND NECK TECHNIQUE: Multidetector CT imaging of the head and neck was performed using the standard protocol during bolus administration of intravenous contrast. Multiplanar CT image reconstructions and MIPs were obtained to evaluate the vascular anatomy. Carotid stenosis measurements (when applicable) are obtained utilizing NASCET criteria, using the distal internal carotid diameter as the denominator. CONTRAST:  138mL OMNIPAQUE IOHEXOL 350 MG/ML SOLN COMPARISON:  CT head without contrast 12/03/2019. FINDINGS: CT HEAD FINDINGS CTA  NECK FINDINGS Aortic arch: A 3 vessel arch configuration is present. Great vessel origins are within normal limits. Atherosclerotic changes are present in the distal arch. No aneurysm or stenosis is present. No traumatic injury is present. Right carotid system: The right common carotid artery is within normal limits. Bifurcation is unremarkable. Cervical right ICA is normal. Left carotid system: The left common carotid artery is within normal limits. Minimal atherosclerotic changes are noted at the bifurcation without significant stenosis. The cervical left ICA is normal. Vertebral arteries: The right vertebral artery is dominant. Both vertebral arteries originate from the subclavian arteries without significant stenosis. No significant stenosis is present in either vertebral artery in the neck. Skeleton: Degenerative anterolisthesis is present at C4-5. Multilevel degenerative endplate changes are present the lower  cervical and upper thoracic spine. No acute fractures are present. Other neck: Extensive subcutaneous emphysema is noted salivary glands and ducts are within normal limits. No significant adenopathy is present. Multinodular thyroid is present. The largest lesion is in the left lobe measuring 2.5 cm. Upper chest: A large right pneumothorax is present. Right pleural effusion is present. Pneumomediastinum is noted. Anterior right second rib fracture is noted. Review of the MIP images confirms the above findings CTA HEAD FINDINGS Anterior circulation: Atherosclerotic changes are present within the cavernous internal carotid arteries bilaterally without significant stenosis from the skull base through the ICA termini. The A1 and M1 segments are normal. The anterior communicating artery is patent. MCA bifurcations are intact. The ACA and MCA branch vessels are within normal limits. Posterior circulation: The right vertebral artery is dominant vessel. The PICA origins are visualized and normal. Vertebrobasilar junction is normal. Both posterior cerebral arteries originate from the basilar tip. The PCA branch vessels are within normal limits bilaterally. Venous sinuses: The dural sinuses are patent. The straight sinus and deep cerebral veins are intact. Cortical veins are unremarkable. Anatomic variants: None Review of the MIP images confirms the above findings IMPRESSION: 1. Large right pneumothorax with pneumomediastinum and right pleural effusion. 2. Anterior right second rib fracture. 3. Normal CTA Circle of Willis without significant proximal stenosis, aneurysm, or branch vessel occlusion. The extra-axial hemorrhage is likely posttraumatic. 4. Minimal atherosclerotic changes at the left carotid bifurcation and bilateral cavernous internal carotid arteries without significant stenosis. 5. Extensive subcutaneous emphysema. 6. Multinodular thyroid. The largest lesion is in the left lobe measuring 2.5 cm. Recommend thyroid  US (ref: J Am Coll Radiol. 2015 Feb;12(2): 143-50). Electronically Signed   By: San Morelle M.D.   On: 12/03/2019 13:28   CT HEAD WO CONTRAST  Result Date: 12/04/2019 CLINICAL DATA:  Follow-up subarachnoid hemorrhage EXAM: CT HEAD WITHOUT CONTRAST TECHNIQUE: Contiguous axial images were obtained from the base of the skull through the vertex without intravenous contrast. COMPARISON:  CT brain 12/03/2019, 10/25/2019 FINDINGS: Brain: No intracranial mass or territorial infarct is visualized. Moderate atrophy. Stable ventricle size. Mild hypodensity in the white matter consistent with chronic small vessel ischemic change. Grossly stable extra-axial hematoma along the right tentorium measuring approximately 2.9 x 2.1 by 3.3 cm with localized mass effect. Stable patent fourth ventricle. Slight increased subarachnoid blood at the cerebellar folia. Similar layering blood along the right tentorium. Multifocal subarachnoid hemorrhage involves the parietal, temporal and occipital lobes. Some of the blood appears slightly more dense/conspicuous compared to the prior CT but the overall distribution is grossly unchanged. No midline shift. Vascular: No hyperdense vessels. Scattered carotid vascular calcification. Skull: No fracture. Sinuses/Orbits: No acute finding. Other: Incompletely visualized soft tissue emphysema  within the superficial and deep spaces of the neck. IMPRESSION: 1. Grossly stable extra-axial hematoma along the right tentorium with localized mass effect. Layering blood along the right tentorium also grossly unchanged. Slight increased foci of subarachnoid blood at the cerebellar folia. 2. Multifocal subarachnoid hemorrhage within the bilateral parietal, temporal, and occipital lobes. Some of the blood appears slightly more dense/conspicuous compared to the prior CT but the overall distribution is grossly unchanged. 3. Atrophy and chronic small vessel ischemic change of the white matter. 4.  Incompletely visualized soft tissue emphysema within the superficial and deep spaces of the neck. Electronically Signed   By: Donavan Foil M.D.   On: 12/04/2019 02:59   CT Head Wo Contrast  Result Date: 12/03/2019 CLINICAL DATA:  Fall this past Wednesday, minor head trauma EXAM: CT HEAD WITHOUT CONTRAST TECHNIQUE: Contiguous axial images were obtained from the base of the skull through the vertex without intravenous contrast. COMPARISON:  Recent prior head CT 10/25/2019 FINDINGS: Brain: Multifocal intracranial hemorrhage. There is a relatively large subdural hematoma layering along the inferior aspect of the right tentorium cerebellum measuring 2.9 x 2.3 x 3.2 cm (volume = 11 cm^3). Additionally, there are foci subarachnoid hemorrhage layering within the sulci overlying the left cerebral hemisphere, and within the right parietal lobe sulci and left temporal lobe sulci. The subdural hematoma along the right tentorium exerts mass effect on the underlying cerebellum and also effaces the CSF space around the right hemi pons. No evidence of intraventricular hemorrhage. No evidence of intra or entrapment at this time. Stable cortical and central atrophy with ex vacuo ventriculomegaly of the lateral ventricles. No evidence of acute ischemia. Vascular: No hyperdense vessel or unexpected calcification. Skull: Normal. Negative for fracture or focal lesion. Sinuses/Orbits: No acute finding. Other: Extensive subcutaneous emphysema tracking along the muscular and soft tissue planes of the skull base and upper neck. IMPRESSION: 1. Multifocal intracranial hemorrhage most significantly a moderately large acute subdural hematoma along the inferior surface of the right tentorium cerebellum with local mass effect on the superior aspect of the right cerebellar hemisphere and effacement of the CSF spaces adjacent to the right pons. 2. Additional multifocal scattered foci of subarachnoid hemorrhage bilaterally. 3. Extensive  subcutaneous emphysema. Critical Value/emergent results were called by telephone at the time of interpretation on 12/03/2019 at 12:14 pm to provider DAN FLOYD , who verbally acknowledged these results. Electronically Signed   By: Jacqulynn Cadet M.D.   On: 12/03/2019 12:14   CT ANGIO NECK W OR WO CONTRAST  Result Date: 12/03/2019 CLINICAL DATA:  Intracranial hemorrhage. Extra-axial hemorrhage along the right cerebellum and inferior to the tentorium. Subarachnoid hemorrhage EXAM: CT ANGIOGRAPHY HEAD AND NECK TECHNIQUE: Multidetector CT imaging of the head and neck was performed using the standard protocol during bolus administration of intravenous contrast. Multiplanar CT image reconstructions and MIPs were obtained to evaluate the vascular anatomy. Carotid stenosis measurements (when applicable) are obtained utilizing NASCET criteria, using the distal internal carotid diameter as the denominator. CONTRAST:  156mL OMNIPAQUE IOHEXOL 350 MG/ML SOLN COMPARISON:  CT head without contrast 12/03/2019. FINDINGS: CT HEAD FINDINGS CTA NECK FINDINGS Aortic arch: A 3 vessel arch configuration is present. Great vessel origins are within normal limits. Atherosclerotic changes are present in the distal arch. No aneurysm or stenosis is present. No traumatic injury is present. Right carotid system: The right common carotid artery is within normal limits. Bifurcation is unremarkable. Cervical right ICA is normal. Left carotid system: The left common carotid artery is within normal  limits. Minimal atherosclerotic changes are noted at the bifurcation without significant stenosis. The cervical left ICA is normal. Vertebral arteries: The right vertebral artery is dominant. Both vertebral arteries originate from the subclavian arteries without significant stenosis. No significant stenosis is present in either vertebral artery in the neck. Skeleton: Degenerative anterolisthesis is present at C4-5. Multilevel degenerative endplate  changes are present the lower cervical and upper thoracic spine. No acute fractures are present. Other neck: Extensive subcutaneous emphysema is noted salivary glands and ducts are within normal limits. No significant adenopathy is present. Multinodular thyroid is present. The largest lesion is in the left lobe measuring 2.5 cm. Upper chest: A large right pneumothorax is present. Right pleural effusion is present. Pneumomediastinum is noted. Anterior right second rib fracture is noted. Review of the MIP images confirms the above findings CTA HEAD FINDINGS Anterior circulation: Atherosclerotic changes are present within the cavernous internal carotid arteries bilaterally without significant stenosis from the skull base through the ICA termini. The A1 and M1 segments are normal. The anterior communicating artery is patent. MCA bifurcations are intact. The ACA and MCA branch vessels are within normal limits. Posterior circulation: The right vertebral artery is dominant vessel. The PICA origins are visualized and normal. Vertebrobasilar junction is normal. Both posterior cerebral arteries originate from the basilar tip. The PCA branch vessels are within normal limits bilaterally. Venous sinuses: The dural sinuses are patent. The straight sinus and deep cerebral veins are intact. Cortical veins are unremarkable. Anatomic variants: None Review of the MIP images confirms the above findings IMPRESSION: 1. Large right pneumothorax with pneumomediastinum and right pleural effusion. 2. Anterior right second rib fracture. 3. Normal CTA Circle of Willis without significant proximal stenosis, aneurysm, or branch vessel occlusion. The extra-axial hemorrhage is likely posttraumatic. 4. Minimal atherosclerotic changes at the left carotid bifurcation and bilateral cavernous internal carotid arteries without significant stenosis. 5. Extensive subcutaneous emphysema. 6. Multinodular thyroid. The largest lesion is in the left lobe  measuring 2.5 cm. Recommend thyroid US (ref: J Am Coll Radiol. 2015 Feb;12(2): 143-50). Electronically Signed   By: San Morelle M.D.   On: 12/03/2019 13:28   CT CHEST ABDOMEN PELVIS W CONTRAST  Result Date: 12/03/2019 CLINICAL DATA:  Trauma.  Pneumothorax and subcutaneous emphysema. EXAM: CT CHEST, ABDOMEN, AND PELVIS WITH CONTRAST TECHNIQUE: Multidetector CT imaging of the chest, abdomen and pelvis was performed following the standard protocol during bolus administration of intravenous contrast. CONTRAST:  189mL OMNIPAQUE IOHEXOL 350 MG/ML SOLN COMPARISON:  Chest x-ray 12/03/2019 FINDINGS: CT CHEST FINDINGS Cardiovascular: The heart is normal in size. No pericardial effusion. Mild tortuosity and calcification of the thoracic aorta but no aneurysm or dissection. The branch vessels are patent. LAD stent noted. Pacer wires in good position without complicating features. There is mild shift of the heart and mediastinum to the left side due to a large right pneumothorax. Mediastinum/Nodes: No mediastinal or hilar mass or adenopathy. There is extensive pneumomediastinum. The esophagus is grossly normal. There is a complex left thyroid lobe lesion measuring 2.4 cm. Recommend thyroid US (ref: J Am Coll Radiol. 2015 Feb;12(2): 143-50). Lungs/Pleura: There is a large right-sided pneumothorax estimated at 60%. There is mild shift of the heart mediastinum to the left suggesting a tension pneumothorax. Associated significant right lower lobe atelectasis and small to moderate-sized right pleural effusion. The left lung is clear. Musculoskeletal: There is a displaced right second anterior rib fracture and associated adjacent pleural hematoma. This likely causes the pneumothorax. There also nondisplaced fractures involving the  third and fourth ribs. No sternal fractures identified. Suspect remote healed upper sternal fracture. The thoracic vertebral bodies are normally aligned. No acute fracture. There are remote  healed fractures involving the left posterior ribs and also a remote fracture of the left humeral head. CT ABDOMEN PELVIS FINDINGS Hepatobiliary: No acute hepatic injury is identified. No perihepatic fluid collections. No hepatic lesions. The gallbladder is normal. No common bile duct dilatation. Pancreas: No mass, inflammation or ductal dilatation. No acute pancreatic injury or peripancreatic fluid collection. Spleen: Normal size. No acute splenic injury or perisplenic fluid collection. Adrenals/Urinary Tract: The adrenal glands and kidneys are unremarkable except for a large simple appearing left renal cyst, right-sided parapelvic renal cysts and a lower pole left renal calculus. No acute renal injury or perirenal fluid collections. There is a Foley catheter noted in the bladder. There is a large bladder calculus measuring 16 mm. Stomach/Bowel: The stomach, duodenum, small bowel and colon are grossly normal. No acute inflammatory changes, mass lesions or obstructive findings. Vascular/Lymphatic: 4.7 cm infrarenal abdominal aortic aneurysm ending just above the iliac artery bifurcation. No dissection. Moderate atherosclerotic calcifications. Mild fusiform enlargement of both iliac arteries but no focal aneurysm or dissection. The major venous structures are patent. No mesenteric or retroperitoneal mass, adenopathy or hematoma. Reproductive: The prostate gland and seminal vesicles are grossly normal. Other: No pelvic mass or adenopathy. No free pelvic fluid collections. No inguinal mass or adenopathy. No abdominal wall hernia or subcutaneous lesions. Musculoskeletal: Advanced degenerative lumbar spondylosis with multilevel disc disease and facet disease but no acute lumbar spine fracture. The bony pelvis is intact.  No hip or pelvic fractures. IMPRESSION: 1. Large right-sided pneumothorax estimated at 60% with mild shift of the heart and mediastinum to the left side suggesting a tension pneumothorax. 2. Right-sided  rib fractures as discussed above. The displaced right second anterior rib fracture is likely the cause of the pneumothorax. 3. Large amount of pneumomediastinum. 4. No acute abdominal/pelvic findings, mass lesions or adenopathy. No acute abdominal/pelvic injury is identified. 5. 4.7 cm infrarenal abdominal aortic aneurysm. Recommend follow-up every 6 months and vascular consultation. This recommendation follows ACR consensus guidelines: White Paper of the ACR Incidental Findings Committee II on Vascular Findings. J Am Coll Radiol 2013; 10:789-794. 6. 2.4 cm left thyroid lesion. Recommend thyroid US (ref: J Am Coll Radiol. 2015 Feb;12(2): 143-50). 7. Large bladder calculus. These results were called by telephone at the time of interpretation on 12/03/2019 at 1:07 pm to provider DAN FLOYD , who verbally acknowledged these results. Aortic Atherosclerosis (ICD10-I70.0). Electronically Signed   By: Marijo Sanes M.D.   On: 12/03/2019 13:05   DG CHEST PORT 1 VIEW  Result Date: 12/05/2019 CLINICAL DATA:  Pneumothorax. EXAM: PORTABLE CHEST 1 VIEW COMPARISON:  Yesterday FINDINGS: Right-sided chest tube in place. No visible pneumothorax. Extensive chest wall emphysema that is not clearly changed when accounting for differences in coverage. Cardiomegaly and pulmonary infiltrates. Coronary stenting. Defibrillator lead from the left in stable position. IMPRESSION: No visible pneumothorax. Electronically Signed   By: Monte Fantasia M.D.   On: 12/05/2019 06:58   DG Chest Port 1 View  Result Date: 12/04/2019 CLINICAL DATA:  Pneumothorax. EXAM: PORTABLE CHEST 1 VIEW COMPARISON:  12/03/2019 FINDINGS: Right basilar chest tube appears to have pulled back since the prior study. Side holes appear to remain within the chest cavity. Small right apical pneumothorax again noted. Extensive subcutaneous emphysema in the right chest and neck unchanged. Improved aeration in the lung bases. Left lung remains  clear. Negative for heart  failure. AICD noted. Left coronary stent noted. IMPRESSION: Pigtail chest tube on the right has pulled back but appears remain in the chest. Small right pneumothorax unchanged. Improvement in bibasilar atelectasis. Electronically Signed   By: Franchot Gallo M.D.   On: 12/04/2019 07:42   DG Chest Portable 1 View  Result Date: 12/03/2019 CLINICAL DATA:  Pneumothorax, post chest tube placement EXAM: PORTABLE CHEST 1 VIEW COMPARISON:  CT 12/03/2019 FINDINGS: Partially coiled right pleural drain projects over the right upper lung. Question a trace residual right apical pneumothorax. Opacity in the right mid to lower lung lung base which could reflect residual atelectasis or some mild re-expansion edema or underlying consolidation. Additional atelectatic changes present in the left lung. Subcutaneous emphysema seen across the right chest wall and base of the neck bilaterally. Small volume pneumomediastinum remains. AICD battery pack projects over the left chest wall with lead in stable position towards the cardiac apex. Coronary stent is noted. Remaining cardiomediastinal contours are unremarkable. Rib fractures are better seen on comparison radiograph. IMPRESSION: 1. Question a trace residual right apical pneumothorax despite the placement of a right chest tube partially coiled in right upper lung. 2. Opacity in the right mid to lower lung base which could reflect residual atelectasis, some mild re-expansion edema or underlying consolidation. 3. Extensive subcutaneous emphysema across the right chest wall and base of the neck bilaterally. 4. Persistent pneumomediastinum. 5. Multiple right rib fractures better seen on CT. Electronically Signed   By: Lovena Le M.D.   On: 12/03/2019 15:01   DG Chest Portable 1 View  Result Date: 12/03/2019 CLINICAL DATA:  Shortness of breath, urinary retention, laps lung, CHF, ischemic cardiomyopathy, coronary artery disease post MI, hypertension EXAM: PORTABLE CHEST 1 VIEW  COMPARISON:  Portable exam 1007 hours compared to 08/11/2019 FINDINGS: LEFT subclavian ICD lead projects over RIGHT ventricle. Normal heart size post coronary stenting. Mediastinal contours and pulmonary vascularity normal. Asymmetric lucency of the RIGHT hemithorax at least in part due to superimposed chest wall emphysema, which extends into the cervical regions bilaterally. Small loculated pneumothorax identified at medial RIGHT upper lobe. Subsegmental atelectasis at bases. Remaining lungs clear without pulmonary infiltrate or pleural effusion. Bones demineralized. IMPRESSION: Significant RIGHT chest wall emphysema extending into the cervical regions bilaterally. Small loculated pneumothorax at medial RIGHT upper lobe. Critical Value/emergent results were called by telephone at the time of interpretation on 12/03/2019 at 10:33 am to provider DAN FLOYD MD, who verbally acknowledged these results. Electronically Signed   By: Lavonia Dana M.D.   On: 12/03/2019 10:34   ECHOCARDIOGRAM COMPLETE  Result Date: 12/04/2019    ECHOCARDIOGRAM REPORT   Patient Name:   JARTAVIOUS MCKIMMY Date of Exam: 12/04/2019 Medical Rec #:  [id number]       Height:       70.0 in Accession #:    [id number]      Weight:       205.0 lb Date of Birth:  [birth year]       BSA:          2.109 m Patient Age:    59 years        BP:           99/53 mmHg Patient Gender: M               HR:           60 bpm. Exam Location:  Inpatient Procedure: 2D Echo, Intracardiac Opacification Agent, Color Doppler and  Cardiac            Doppler Indications:    History of congestive heart disease  History:        Patient has prior history of Echocardiogram examinations, most                 recent 08/12/2019. CHF, CAD and Previous Myocardial Infarction,                 Defibrillator; Risk Factors:Dyslipidemia and Sleep Apnea. H/O                 DVT. Ischemic cardiomyopathy.  Sonographer:    Clayton Lefort RDCS (AE) Referring Phys: [id number] [facility]  Sonographer  Comments: Suboptimal parasternal window and no subcostal window. IMPRESSIONS  1. There is an inferoapical filling defect by Definity contrast that is likely coarse trabeculation of the ventricular wall and is noted to flatten after a PVC, making a thrombus less likely. Left ventricular ejection fraction, by estimation, is 30 to 35%. The left ventricle has moderately decreased function. The left ventricle demonstrates regional wall motion abnormalities (see scoring diagram/findings for description). There is mild left ventricular hypertrophy. Left ventricular diastolic parameters are consistent with Grade I diastolic dysfunction (impaired relaxation). There is severe akinesis of the left ventricular, entire anterior wall, anteroseptal wall, anterior segment and inferoapical segment. The apex appears aneurysmal.  2. An AICD is visualized.  3. The mitral valve is abnormal. Trivial mitral valve regurgitation.  4. The aortic valve is tricuspid. Aortic valve regurgitation is mild. Mild aortic valve sclerosis is present, with no evidence of aortic valve stenosis. Aortic valve mean gradient measures 3.0 mmHg. Comparison(s): Changes from prior study are noted. 08/12/2019: LVEF 25-30%, diffuse HK and apical aneurysm. FINDINGS  Left Ventricle: There is an inferoapical filling defect by Definity contrast that is likely coarse trabeculation of the ventricular wall and is noted to flatten after a PVC, making a thrombus less likely. Left ventricular ejection fraction, by estimation, is 30 to 35%. The left ventricle has moderately decreased function. The left ventricle demonstrates regional wall motion abnormalities. Severe akinesis of the left ventricular, entire anterior wall, anteroseptal wall, anterior segment and inferoapical segment. Definity contrast agent was given IV to delineate the left ventricular endocardial borders. The left ventricular internal cavity size was normal in size. There is mild left ventricular  hypertrophy. Left ventricular diastolic parameters are consistent with Grade I diastolic dysfunction (impaired relaxation). Indeterminate filling pressures. Right Ventricle: The right ventricular size is normal. No increase in right ventricular wall thickness. Right ventricular systolic function is normal. Left Atrium: Left atrial size was normal in size. Right Atrium: Right atrial size was normal in size. Pericardium: There is no evidence of pericardial effusion. Mitral Valve: The mitral valve is abnormal. There is mild thickening of the mitral valve leaflet(s). There is mild calcification of the mitral valve leaflet(s). Trivial mitral valve regurgitation. MV peak gradient, 2.7 mmHg. The mean mitral valve gradient is 1.0 mmHg. Tricuspid Valve: The tricuspid valve is grossly normal. Tricuspid valve regurgitation is mild. Aortic Valve: The aortic valve is tricuspid. Aortic valve regurgitation is mild. Aortic regurgitation PHT measures 354 msec. Mild aortic valve sclerosis is present, with no evidence of aortic valve stenosis. Aortic valve mean gradient measures 3.0 mmHg. Aortic valve peak gradient measures 5.0 mmHg. Aortic valve area, by VTI measures 1.91 cm. Pulmonic Valve: The pulmonic valve was grossly normal. Pulmonic valve regurgitation is not visualized. Aorta: The aortic root and ascending aorta are structurally  normal, with no evidence of dilitation. IAS/Shunts: No atrial level shunt detected by color flow Doppler. Additional Comments: A n AICD is noted is visualized.  LEFT VENTRICLE PLAX 2D LVIDd:         5.20 cm  Diastology LVIDs:         3.90 cm  LV e' medial:    5.11 cm/s LV PW:         1.10 cm  LV E/e' medial:  11.4 LV IVS:        1.30 cm  LV e' lateral:   7.51 cm/s LVOT diam:     1.90 cm  LV E/e' lateral: 7.8 LV SV:         42 LV SV Index:   20 LVOT Area:     2.84 cm  RIGHT VENTRICLE RV Basal diam:  3.20 cm RV S prime:     11.20 cm/s TAPSE (M-mode): 3.1 cm LEFT ATRIUM             Index       RIGHT  ATRIUM           Index LA diam:        2.50 cm 1.19 cm/m  RA Area:     14.90 cm LA Vol (A2C):   34.5 ml 16.36 ml/m RA Volume:   35.10 ml  16.64 ml/m LA Vol (A4C):   51.3 ml 24.32 ml/m LA Biplane Vol: 45.2 ml 21.43 ml/m  AORTIC VALVE AV Area (Vmax):    2.06 cm AV Area (Vmean):   1.95 cm AV Area (VTI):     1.91 cm AV Vmax:           112.00 cm/s AV Vmean:          83.200 cm/s AV VTI:            0.220 m AV Peak Grad:      5.0 mmHg AV Mean Grad:      3.0 mmHg LVOT Vmax:         81.50 cm/s LVOT Vmean:        57.300 cm/s LVOT VTI:          0.148 m LVOT/AV VTI ratio: 0.67 AI PHT:            354 msec  AORTA Ao Root diam: 3.70 cm MITRAL VALVE               TRICUSPID VALVE MV Area (PHT): 2.73 cm    TR Peak grad:   20.8 mmHg MV Peak grad:  2.7 mmHg    TR Vmax:        228.00 cm/s MV Mean grad:  1.0 mmHg MV Vmax:       0.81 m/s    SHUNTS MV Vmean:      44.8 cm/s   Systemic VTI:  0.15 m MV Decel Time: 278 msec    Systemic Diam: 1.90 cm MV E velocity: 58.30 cm/s MV A velocity: 70.30 cm/s MV E/A ratio:  0.83 Lyman Bishop MD Electronically signed by Lyman Bishop MD Signature Date/Time: 12/04/2019/6:48:21 PM    Final      ASSESSMENT AND PLAN:  1.  History of left lower extremity DVT approximately 20 years ago 2.  Lupus anticoagulant positive 3.  SAH/SDH 4.  Right pneumothorax 5.  History of CHF 6.  Hypertension 7.  History of CAD 8.  Infrarenal AAA 9.  Deconditioning  -The patient and family indicate that he has had only one  DVT in the past.  This was approximately 20 years ago.  However, he has been maintained on anticoagulation since that time.  There is also mention in his chart that he is positive for lupus anticoagulant.  Unfortunately, I am unable to see labs related to this diagnosis.  I have requested records from his PCP. -If the patient has only had one episode of VTE in the past, it may be reasonable to discontinue anticoagulation and not place IVC filter and continue to monitor.  However, final  recommendations will be made once we can review his outside records. -I had a lengthy conversation with the patient's wife by phone and also with his daughter at the bedside.  They have expressed that they have seen continued decline in his performance status and have question if he might be eligible for hospice.  They have expressed interest in talking with the palliative care team regarding goals of care.  Will defer decision regarding referral for palliative care team to the primary team.  Thank you for this referral.  Mikey Bussing, DNP, AGPCNP-BC, AOCNP Mon/Tues/Thurs/Fri 7am-5pm; Off Wednesdays Cell: [phone]  Addendum  I have seen the patient, examined him. I agree with the assessment and and plan and have edited the notes.   I agree with stopping anticoagulation due to his recent multiple fall and ADH/SAH from fall.  Given his history of one episode of DVT, per family, unclear if he had repeated lupus anticoagulant test in the past, no current evidence of DVT, I do not feel strongly he needs IVC filter during this hospitalization.  I recommend repeating lupus anticoagulant, and hypercoagulopathy work-up in a few month after discharge, to see if he is at high risk for recurrent thrombosis, then make a decision on IVC filter.  However patient's general condition has been deteriorating lately, family is considering palliative care. If his general health continue declines, I do not think he needs to see [place] back. I spoke with her daughter at bed side and give her my contact info, she knows to call me if needed after discharge. All questions answered.  Truitt Merle  12/05/2019

## 2019-12-05 NOTE — Progress Notes (Addendum)
Subjective: CC: Patient reports no changes overnight. He is A&O x 4. Notes that he is tolerating his diet without abdominal pain, n/v. Denies HA or other areas of pain. No visual changes or weakness. Worked with PT yesterday.   Objective: Vital signs in last 24 hours: Temp:  [97.6 F (36.4 C)-98.2 F (36.8 C)] 97.6 F (36.4 C) (09/14 0750) Pulse Rate:  [60-100] 80 (09/14 0800) Resp:  [6-25] 15 (09/14 0800) BP: (78-126)/(43-115) 94/57 (09/14 0700) SpO2:  [92 %-98 %] 96 % (09/14 0800) Last BM Date:  (PTA) BP soft overnight. Rechecked while in the room and improved to 101/72. Will hold BP meds and continue IVF  Intake/Output from previous day: 09/13 0701 - 09/14 0700 In: 1950 [P.O.:520; I.V.:1190; IV Piggyback:120] Out: 1070 [Urine:950; Chest Tube:120] Intake/Output this shift: Total I/O In: 330 [P.O.:180; I.V.:50; IV Piggyback:100] Out: 150 [Urine:150]  PE: Gen:  Alert, NAD, pleasant HEENT: PERRL. EOM's intact, pupils equal and round Card:  RRR, no M/G/R heard Pulm:  CTAB, no W/R/R, effort normal. R chest tube in place on -20. No air leak. 120cc bloody output in cannister/24 hours. Dressing c/d/i Abd: Soft, NT/ND, +BS Ext:  Equal grip strength b/l. Able to raise legs off bed b/l.  Psych: A&Ox4 Skin: no rashes noted, warm and dry  Lab Results:  Recent Labs    12/04/19 0205 12/05/19 0145  WBC 8.3 7.8  HGB 14.0 12.7*  HCT 42.3 38.2*  PLT 198 184   BMET Recent Labs    12/03/19 1036 12/04/19 0205  NA 141 142  K 3.6 3.8  CL 104 105  CO2 26 28  GLUCOSE 121* 112*  BUN 14 11  CREATININE 0.96 1.07  CALCIUM 9.7 9.8   PT/INR Recent Labs    12/04/19 0205 12/05/19 0145  LABPROT 15.1 15.5*  INR 1.2 1.3*   CMP     Component Value Date/Time   NA 142 12/04/2019 0205   NA 138 11/09/2017 1618   K 3.8 12/04/2019 0205   CL 105 12/04/2019 0205   CO2 28 12/04/2019 0205   GLUCOSE 112 (H) 12/04/2019 0205   BUN 11 12/04/2019 0205   BUN 21 11/09/2017 1618    CREATININE 1.07 12/04/2019 0205   CREATININE 1.09 10/17/2015 1154   CALCIUM 9.8 12/04/2019 0205   PROT 6.8 12/03/2019 1036   PROT 7.0 09/14/2018 0839   ALBUMIN 3.4 (L) 12/03/2019 1036   ALBUMIN 4.6 09/14/2018 0839   AST 18 12/03/2019 1036   ALT 12 12/03/2019 1036   ALKPHOS 32 (L) 12/03/2019 1036   BILITOT 1.5 (H) 12/03/2019 1036   BILITOT 0.6 09/14/2018 0839   GFRNONAA >60 12/04/2019 0205   GFRAA >60 12/04/2019 0205   Lipase     Component Value Date/Time   LIPASE 40 10/25/2017 2349       Studies/Results: CT ANGIO HEAD W OR WO CONTRAST  Result Date: 12/03/2019 CLINICAL DATA:  Intracranial hemorrhage. Extra-axial hemorrhage along the right cerebellum and inferior to the tentorium. Subarachnoid hemorrhage EXAM: CT ANGIOGRAPHY HEAD AND NECK TECHNIQUE: Multidetector CT imaging of the head and neck was performed using the standard protocol during bolus administration of intravenous contrast. Multiplanar CT image reconstructions and MIPs were obtained to evaluate the vascular anatomy. Carotid stenosis measurements (when applicable) are obtained utilizing NASCET criteria, using the distal internal carotid diameter as the denominator. CONTRAST:  165mL OMNIPAQUE IOHEXOL 350 MG/ML SOLN COMPARISON:  CT head without contrast 12/03/2019. FINDINGS: CT HEAD FINDINGS CTA NECK FINDINGS  Aortic arch: A 3 vessel arch configuration is present. Great vessel origins are within normal limits. Atherosclerotic changes are present in the distal arch. No aneurysm or stenosis is present. No traumatic injury is present. Right carotid system: The right common carotid artery is within normal limits. Bifurcation is unremarkable. Cervical right ICA is normal. Left carotid system: The left common carotid artery is within normal limits. Minimal atherosclerotic changes are noted at the bifurcation without significant stenosis. The cervical left ICA is normal. Vertebral arteries: The right vertebral artery is dominant. Both  vertebral arteries originate from the subclavian arteries without significant stenosis. No significant stenosis is present in either vertebral artery in the neck. Skeleton: Degenerative anterolisthesis is present at C4-5. Multilevel degenerative endplate changes are present the lower cervical and upper thoracic spine. No acute fractures are present. Other neck: Extensive subcutaneous emphysema is noted salivary glands and ducts are within normal limits. No significant adenopathy is present. Multinodular thyroid is present. The largest lesion is in the left lobe measuring 2.5 cm. Upper chest: A large right pneumothorax is present. Right pleural effusion is present. Pneumomediastinum is noted. Anterior right second rib fracture is noted. Review of the MIP images confirms the above findings CTA HEAD FINDINGS Anterior circulation: Atherosclerotic changes are present within the cavernous internal carotid arteries bilaterally without significant stenosis from the skull base through the ICA termini. The A1 and M1 segments are normal. The anterior communicating artery is patent. MCA bifurcations are intact. The ACA and MCA branch vessels are within normal limits. Posterior circulation: The right vertebral artery is dominant vessel. The PICA origins are visualized and normal. Vertebrobasilar junction is normal. Both posterior cerebral arteries originate from the basilar tip. The PCA branch vessels are within normal limits bilaterally. Venous sinuses: The dural sinuses are patent. The straight sinus and deep cerebral veins are intact. Cortical veins are unremarkable. Anatomic variants: None Review of the MIP images confirms the above findings IMPRESSION: 1. Large right pneumothorax with pneumomediastinum and right pleural effusion. 2. Anterior right second rib fracture. 3. Normal CTA Circle of Willis without significant proximal stenosis, aneurysm, or branch vessel occlusion. The extra-axial hemorrhage is likely  posttraumatic. 4. Minimal atherosclerotic changes at the left carotid bifurcation and bilateral cavernous internal carotid arteries without significant stenosis. 5. Extensive subcutaneous emphysema. 6. Multinodular thyroid. The largest lesion is in the left lobe measuring 2.5 cm. Recommend thyroid US (ref: J Am Coll Radiol. 2015 Feb;12(2): 143-50). Electronically Signed   By: San Morelle M.D.   On: 12/03/2019 13:28   CT HEAD WO CONTRAST  Result Date: 12/04/2019 CLINICAL DATA:  Follow-up subarachnoid hemorrhage EXAM: CT HEAD WITHOUT CONTRAST TECHNIQUE: Contiguous axial images were obtained from the base of the skull through the vertex without intravenous contrast. COMPARISON:  CT brain 12/03/2019, 10/25/2019 FINDINGS: Brain: No intracranial mass or territorial infarct is visualized. Moderate atrophy. Stable ventricle size. Mild hypodensity in the white matter consistent with chronic small vessel ischemic change. Grossly stable extra-axial hematoma along the right tentorium measuring approximately 2.9 x 2.1 by 3.3 cm with localized mass effect. Stable patent fourth ventricle. Slight increased subarachnoid blood at the cerebellar folia. Similar layering blood along the right tentorium. Multifocal subarachnoid hemorrhage involves the parietal, temporal and occipital lobes. Some of the blood appears slightly more dense/conspicuous compared to the prior CT but the overall distribution is grossly unchanged. No midline shift. Vascular: No hyperdense vessels. Scattered carotid vascular calcification. Skull: No fracture. Sinuses/Orbits: No acute finding. Other: Incompletely visualized soft tissue emphysema within the  superficial and deep spaces of the neck. IMPRESSION: 1. Grossly stable extra-axial hematoma along the right tentorium with localized mass effect. Layering blood along the right tentorium also grossly unchanged. Slight increased foci of subarachnoid blood at the cerebellar folia. 2. Multifocal  subarachnoid hemorrhage within the bilateral parietal, temporal, and occipital lobes. Some of the blood appears slightly more dense/conspicuous compared to the prior CT but the overall distribution is grossly unchanged. 3. Atrophy and chronic small vessel ischemic change of the white matter. 4. Incompletely visualized soft tissue emphysema within the superficial and deep spaces of the neck. Electronically Signed   By: Donavan Foil M.D.   On: 12/04/2019 02:59   CT Head Wo Contrast  Result Date: 12/03/2019 CLINICAL DATA:  Fall this past Wednesday, minor head trauma EXAM: CT HEAD WITHOUT CONTRAST TECHNIQUE: Contiguous axial images were obtained from the base of the skull through the vertex without intravenous contrast. COMPARISON:  Recent prior head CT 10/25/2019 FINDINGS: Brain: Multifocal intracranial hemorrhage. There is a relatively large subdural hematoma layering along the inferior aspect of the right tentorium cerebellum measuring 2.9 x 2.3 x 3.2 cm (volume = 11 cm^3). Additionally, there are foci subarachnoid hemorrhage layering within the sulci overlying the left cerebral hemisphere, and within the right parietal lobe sulci and left temporal lobe sulci. The subdural hematoma along the right tentorium exerts mass effect on the underlying cerebellum and also effaces the CSF space around the right hemi pons. No evidence of intraventricular hemorrhage. No evidence of intra or entrapment at this time. Stable cortical and central atrophy with ex vacuo ventriculomegaly of the lateral ventricles. No evidence of acute ischemia. Vascular: No hyperdense vessel or unexpected calcification. Skull: Normal. Negative for fracture or focal lesion. Sinuses/Orbits: No acute finding. Other: Extensive subcutaneous emphysema tracking along the muscular and soft tissue planes of the skull base and upper neck. IMPRESSION: 1. Multifocal intracranial hemorrhage most significantly a moderately large acute subdural hematoma along  the inferior surface of the right tentorium cerebellum with local mass effect on the superior aspect of the right cerebellar hemisphere and effacement of the CSF spaces adjacent to the right pons. 2. Additional multifocal scattered foci of subarachnoid hemorrhage bilaterally. 3. Extensive subcutaneous emphysema. Critical Value/emergent results were called by telephone at the time of interpretation on 12/03/2019 at 12:14 pm to provider DAN FLOYD , who verbally acknowledged these results. Electronically Signed   By: Jacqulynn Cadet M.D.   On: 12/03/2019 12:14   CT ANGIO NECK W OR WO CONTRAST  Result Date: 12/03/2019 CLINICAL DATA:  Intracranial hemorrhage. Extra-axial hemorrhage along the right cerebellum and inferior to the tentorium. Subarachnoid hemorrhage EXAM: CT ANGIOGRAPHY HEAD AND NECK TECHNIQUE: Multidetector CT imaging of the head and neck was performed using the standard protocol during bolus administration of intravenous contrast. Multiplanar CT image reconstructions and MIPs were obtained to evaluate the vascular anatomy. Carotid stenosis measurements (when applicable) are obtained utilizing NASCET criteria, using the distal internal carotid diameter as the denominator. CONTRAST:  154mL OMNIPAQUE IOHEXOL 350 MG/ML SOLN COMPARISON:  CT head without contrast 12/03/2019. FINDINGS: CT HEAD FINDINGS CTA NECK FINDINGS Aortic arch: A 3 vessel arch configuration is present. Great vessel origins are within normal limits. Atherosclerotic changes are present in the distal arch. No aneurysm or stenosis is present. No traumatic injury is present. Right carotid system: The right common carotid artery is within normal limits. Bifurcation is unremarkable. Cervical right ICA is normal. Left carotid system: The left common carotid artery is within normal limits. Minimal  atherosclerotic changes are noted at the bifurcation without significant stenosis. The cervical left ICA is normal. Vertebral arteries: The right  vertebral artery is dominant. Both vertebral arteries originate from the subclavian arteries without significant stenosis. No significant stenosis is present in either vertebral artery in the neck. Skeleton: Degenerative anterolisthesis is present at C4-5. Multilevel degenerative endplate changes are present the lower cervical and upper thoracic spine. No acute fractures are present. Other neck: Extensive subcutaneous emphysema is noted salivary glands and ducts are within normal limits. No significant adenopathy is present. Multinodular thyroid is present. The largest lesion is in the left lobe measuring 2.5 cm. Upper chest: A large right pneumothorax is present. Right pleural effusion is present. Pneumomediastinum is noted. Anterior right second rib fracture is noted. Review of the MIP images confirms the above findings CTA HEAD FINDINGS Anterior circulation: Atherosclerotic changes are present within the cavernous internal carotid arteries bilaterally without significant stenosis from the skull base through the ICA termini. The A1 and M1 segments are normal. The anterior communicating artery is patent. MCA bifurcations are intact. The ACA and MCA branch vessels are within normal limits. Posterior circulation: The right vertebral artery is dominant vessel. The PICA origins are visualized and normal. Vertebrobasilar junction is normal. Both posterior cerebral arteries originate from the basilar tip. The PCA branch vessels are within normal limits bilaterally. Venous sinuses: The dural sinuses are patent. The straight sinus and deep cerebral veins are intact. Cortical veins are unremarkable. Anatomic variants: None Review of the MIP images confirms the above findings IMPRESSION: 1. Large right pneumothorax with pneumomediastinum and right pleural effusion. 2. Anterior right second rib fracture. 3. Normal CTA Circle of Willis without significant proximal stenosis, aneurysm, or branch vessel occlusion. The extra-axial  hemorrhage is likely posttraumatic. 4. Minimal atherosclerotic changes at the left carotid bifurcation and bilateral cavernous internal carotid arteries without significant stenosis. 5. Extensive subcutaneous emphysema. 6. Multinodular thyroid. The largest lesion is in the left lobe measuring 2.5 cm. Recommend thyroid US (ref: J Am Coll Radiol. 2015 Feb;12(2): 143-50). Electronically Signed   By: San Morelle M.D.   On: 12/03/2019 13:28   CT CHEST ABDOMEN PELVIS W CONTRAST  Result Date: 12/03/2019 CLINICAL DATA:  Trauma.  Pneumothorax and subcutaneous emphysema. EXAM: CT CHEST, ABDOMEN, AND PELVIS WITH CONTRAST TECHNIQUE: Multidetector CT imaging of the chest, abdomen and pelvis was performed following the standard protocol during bolus administration of intravenous contrast. CONTRAST:  128mL OMNIPAQUE IOHEXOL 350 MG/ML SOLN COMPARISON:  Chest x-ray 12/03/2019 FINDINGS: CT CHEST FINDINGS Cardiovascular: The heart is normal in size. No pericardial effusion. Mild tortuosity and calcification of the thoracic aorta but no aneurysm or dissection. The branch vessels are patent. LAD stent noted. Pacer wires in good position without complicating features. There is mild shift of the heart and mediastinum to the left side due to a large right pneumothorax. Mediastinum/Nodes: No mediastinal or hilar mass or adenopathy. There is extensive pneumomediastinum. The esophagus is grossly normal. There is a complex left thyroid lobe lesion measuring 2.4 cm. Recommend thyroid US (ref: J Am Coll Radiol. 2015 Feb;12(2): 143-50). Lungs/Pleura: There is a large right-sided pneumothorax estimated at 60%. There is mild shift of the heart mediastinum to the left suggesting a tension pneumothorax. Associated significant right lower lobe atelectasis and small to moderate-sized right pleural effusion. The left lung is clear. Musculoskeletal: There is a displaced right second anterior rib fracture and associated adjacent pleural  hematoma. This likely causes the pneumothorax. There also nondisplaced fractures involving the third  and fourth ribs. No sternal fractures identified. Suspect remote healed upper sternal fracture. The thoracic vertebral bodies are normally aligned. No acute fracture. There are remote healed fractures involving the left posterior ribs and also a remote fracture of the left humeral head. CT ABDOMEN PELVIS FINDINGS Hepatobiliary: No acute hepatic injury is identified. No perihepatic fluid collections. No hepatic lesions. The gallbladder is normal. No common bile duct dilatation. Pancreas: No mass, inflammation or ductal dilatation. No acute pancreatic injury or peripancreatic fluid collection. Spleen: Normal size. No acute splenic injury or perisplenic fluid collection. Adrenals/Urinary Tract: The adrenal glands and kidneys are unremarkable except for a large simple appearing left renal cyst, right-sided parapelvic renal cysts and a lower pole left renal calculus. No acute renal injury or perirenal fluid collections. There is a Foley catheter noted in the bladder. There is a large bladder calculus measuring 16 mm. Stomach/Bowel: The stomach, duodenum, small bowel and colon are grossly normal. No acute inflammatory changes, mass lesions or obstructive findings. Vascular/Lymphatic: 4.7 cm infrarenal abdominal aortic aneurysm ending just above the iliac artery bifurcation. No dissection. Moderate atherosclerotic calcifications. Mild fusiform enlargement of both iliac arteries but no focal aneurysm or dissection. The major venous structures are patent. No mesenteric or retroperitoneal mass, adenopathy or hematoma. Reproductive: The prostate gland and seminal vesicles are grossly normal. Other: No pelvic mass or adenopathy. No free pelvic fluid collections. No inguinal mass or adenopathy. No abdominal wall hernia or subcutaneous lesions. Musculoskeletal: Advanced degenerative lumbar spondylosis with multilevel disc disease  and facet disease but no acute lumbar spine fracture. The bony pelvis is intact.  No hip or pelvic fractures. IMPRESSION: 1. Large right-sided pneumothorax estimated at 60% with mild shift of the heart and mediastinum to the left side suggesting a tension pneumothorax. 2. Right-sided rib fractures as discussed above. The displaced right second anterior rib fracture is likely the cause of the pneumothorax. 3. Large amount of pneumomediastinum. 4. No acute abdominal/pelvic findings, mass lesions or adenopathy. No acute abdominal/pelvic injury is identified. 5. 4.7 cm infrarenal abdominal aortic aneurysm. Recommend follow-up every 6 months and vascular consultation. This recommendation follows ACR consensus guidelines: White Paper of the ACR Incidental Findings Committee II on Vascular Findings. J Am Coll Radiol 2013; 10:789-794. 6. 2.4 cm left thyroid lesion. Recommend thyroid US (ref: J Am Coll Radiol. 2015 Feb;12(2): 143-50). 7. Large bladder calculus. These results were called by telephone at the time of interpretation on 12/03/2019 at 1:07 pm to provider DAN FLOYD , who verbally acknowledged these results. Aortic Atherosclerosis (ICD10-I70.0). Electronically Signed   By: Marijo Sanes M.D.   On: 12/03/2019 13:05   DG CHEST PORT 1 VIEW  Result Date: 12/05/2019 CLINICAL DATA:  Pneumothorax. EXAM: PORTABLE CHEST 1 VIEW COMPARISON:  Yesterday FINDINGS: Right-sided chest tube in place. No visible pneumothorax. Extensive chest wall emphysema that is not clearly changed when accounting for differences in coverage. Cardiomegaly and pulmonary infiltrates. Coronary stenting. Defibrillator lead from the left in stable position. IMPRESSION: No visible pneumothorax. Electronically Signed   By: Monte Fantasia M.D.   On: 12/05/2019 06:58   DG Chest Port 1 View  Result Date: 12/04/2019 CLINICAL DATA:  Pneumothorax. EXAM: PORTABLE CHEST 1 VIEW COMPARISON:  12/03/2019 FINDINGS: Right basilar chest tube appears to have  pulled back since the prior study. Side holes appear to remain within the chest cavity. Small right apical pneumothorax again noted. Extensive subcutaneous emphysema in the right chest and neck unchanged. Improved aeration in the lung bases. Left lung remains clear.  Negative for heart failure. AICD noted. Left coronary stent noted. IMPRESSION: Pigtail chest tube on the right has pulled back but appears remain in the chest. Small right pneumothorax unchanged. Improvement in bibasilar atelectasis. Electronically Signed   By: Franchot Gallo M.D.   On: 12/04/2019 07:42   DG Chest Portable 1 View  Result Date: 12/03/2019 CLINICAL DATA:  Pneumothorax, post chest tube placement EXAM: PORTABLE CHEST 1 VIEW COMPARISON:  CT 12/03/2019 FINDINGS: Partially coiled right pleural drain projects over the right upper lung. Question a trace residual right apical pneumothorax. Opacity in the right mid to lower lung lung base which could reflect residual atelectasis or some mild re-expansion edema or underlying consolidation. Additional atelectatic changes present in the left lung. Subcutaneous emphysema seen across the right chest wall and base of the neck bilaterally. Small volume pneumomediastinum remains. AICD battery pack projects over the left chest wall with lead in stable position towards the cardiac apex. Coronary stent is noted. Remaining cardiomediastinal contours are unremarkable. Rib fractures are better seen on comparison radiograph. IMPRESSION: 1. Question a trace residual right apical pneumothorax despite the placement of a right chest tube partially coiled in right upper lung. 2. Opacity in the right mid to lower lung base which could reflect residual atelectasis, some mild re-expansion edema or underlying consolidation. 3. Extensive subcutaneous emphysema across the right chest wall and base of the neck bilaterally. 4. Persistent pneumomediastinum. 5. Multiple right rib fractures better seen on CT. Electronically  Signed   By: Lovena Le M.D.   On: 12/03/2019 15:01   DG Chest Portable 1 View  Result Date: 12/03/2019 CLINICAL DATA:  Shortness of breath, urinary retention, laps lung, CHF, ischemic cardiomyopathy, coronary artery disease post MI, hypertension EXAM: PORTABLE CHEST 1 VIEW COMPARISON:  Portable exam 1007 hours compared to 08/11/2019 FINDINGS: LEFT subclavian ICD lead projects over RIGHT ventricle. Normal heart size post coronary stenting. Mediastinal contours and pulmonary vascularity normal. Asymmetric lucency of the RIGHT hemithorax at least in part due to superimposed chest wall emphysema, which extends into the cervical regions bilaterally. Small loculated pneumothorax identified at medial RIGHT upper lobe. Subsegmental atelectasis at bases. Remaining lungs clear without pulmonary infiltrate or pleural effusion. Bones demineralized. IMPRESSION: Significant RIGHT chest wall emphysema extending into the cervical regions bilaterally. Small loculated pneumothorax at medial RIGHT upper lobe. Critical Value/emergent results were called by telephone at the time of interpretation on 12/03/2019 at 10:33 am to provider DAN FLOYD MD, who verbally acknowledged these results. Electronically Signed   By: Lavonia Dana M.D.   On: 12/03/2019 10:34   ECHOCARDIOGRAM COMPLETE  Result Date: 12/04/2019    ECHOCARDIOGRAM REPORT   Patient Name:   Kevin Hart Date of Exam: 12/04/2019 Medical Rec #:  [id number]       Height:       70.0 in Accession #:    [id number]      Weight:       205.0 lb Date of Birth:  1946-03-16       BSA:          2.109 m Patient Age:    74 years        BP:           99/53 mmHg Patient Gender: M               HR:           60 bpm. Exam Location:  Inpatient Procedure: 2D Echo, Intracardiac Opacification Agent, Color Doppler and Cardiac  Doppler Indications:    History of congestive heart disease  History:        Patient has prior history of Echocardiogram examinations, most                  recent 08/12/2019. CHF, CAD and Previous Myocardial Infarction,                 Defibrillator; Risk Factors:Dyslipidemia and Sleep Apnea. H/O                 DVT. Ischemic cardiomyopathy.  Sonographer:    Clayton Lefort RDCS (AE) Referring Phys: [id number] [facility]  Sonographer Comments: Suboptimal parasternal window and no subcostal window. IMPRESSIONS  1. There is an inferoapical filling defect by Definity contrast that is likely coarse trabeculation of the ventricular wall and is noted to flatten after a PVC, making a thrombus less likely. Left ventricular ejection fraction, by estimation, is 30 to 35%. The left ventricle has moderately decreased function. The left ventricle demonstrates regional wall motion abnormalities (see scoring diagram/findings for description). There is mild left ventricular hypertrophy. Left ventricular diastolic parameters are consistent with Grade I diastolic dysfunction (impaired relaxation). There is severe akinesis of the left ventricular, entire anterior wall, anteroseptal wall, anterior segment and inferoapical segment. The apex appears aneurysmal.  2. An AICD is visualized.  3. The mitral valve is abnormal. Trivial mitral valve regurgitation.  4. The aortic valve is tricuspid. Aortic valve regurgitation is mild. Mild aortic valve sclerosis is present, with no evidence of aortic valve stenosis. Aortic valve mean gradient measures 3.0 mmHg. Comparison(s): Changes from prior study are noted. 08/12/2019: LVEF 25-30%, diffuse HK and apical aneurysm. FINDINGS  Left Ventricle: There is an inferoapical filling defect by Definity contrast that is likely coarse trabeculation of the ventricular wall and is noted to flatten after a PVC, making a thrombus less likely. Left ventricular ejection fraction, by estimation, is 30 to 35%. The left ventricle has moderately decreased function. The left ventricle demonstrates regional wall motion abnormalities. Severe akinesis of the left ventricular,  entire anterior wall, anteroseptal wall, anterior segment and inferoapical segment. Definity contrast agent was given IV to delineate the left ventricular endocardial borders. The left ventricular internal cavity size was normal in size. There is mild left ventricular hypertrophy. Left ventricular diastolic parameters are consistent with Grade I diastolic dysfunction (impaired relaxation). Indeterminate filling pressures. Right Ventricle: The right ventricular size is normal. No increase in right ventricular wall thickness. Right ventricular systolic function is normal. Left Atrium: Left atrial size was normal in size. Right Atrium: Right atrial size was normal in size. Pericardium: There is no evidence of pericardial effusion. Mitral Valve: The mitral valve is abnormal. There is mild thickening of the mitral valve leaflet(s). There is mild calcification of the mitral valve leaflet(s). Trivial mitral valve regurgitation. MV peak gradient, 2.7 mmHg. The mean mitral valve gradient is 1.0 mmHg. Tricuspid Valve: The tricuspid valve is grossly normal. Tricuspid valve regurgitation is mild. Aortic Valve: The aortic valve is tricuspid. Aortic valve regurgitation is mild. Aortic regurgitation PHT measures 354 msec. Mild aortic valve sclerosis is present, with no evidence of aortic valve stenosis. Aortic valve mean gradient measures 3.0 mmHg. Aortic valve peak gradient measures 5.0 mmHg. Aortic valve area, by VTI measures 1.91 cm. Pulmonic Valve: The pulmonic valve was grossly normal. Pulmonic valve regurgitation is not visualized. Aorta: The aortic root and ascending aorta are structurally normal, with no evidence of dilitation. IAS/Shunts: No atrial level shunt detected  by color flow Doppler. Additional Comments: A n AICD is noted is visualized.  LEFT VENTRICLE PLAX 2D LVIDd:         5.20 cm  Diastology LVIDs:         3.90 cm  LV e' medial:    5.11 cm/s LV PW:         1.10 cm  LV E/e' medial:  11.4 LV IVS:        1.30 cm   LV e' lateral:   7.51 cm/s LVOT diam:     1.90 cm  LV E/e' lateral: 7.8 LV SV:         42 LV SV Index:   20 LVOT Area:     2.84 cm  RIGHT VENTRICLE RV Basal diam:  3.20 cm RV S prime:     11.20 cm/s TAPSE (M-mode): 3.1 cm LEFT ATRIUM             Index       RIGHT ATRIUM           Index LA diam:        2.50 cm 1.19 cm/m  RA Area:     14.90 cm LA Vol (A2C):   34.5 ml 16.36 ml/m RA Volume:   35.10 ml  16.64 ml/m LA Vol (A4C):   51.3 ml 24.32 ml/m LA Biplane Vol: 45.2 ml 21.43 ml/m  AORTIC VALVE AV Area (Vmax):    2.06 cm AV Area (Vmean):   1.95 cm AV Area (VTI):     1.91 cm AV Vmax:           112.00 cm/s AV Vmean:          83.200 cm/s AV VTI:            0.220 m AV Peak Grad:      5.0 mmHg AV Mean Grad:      3.0 mmHg LVOT Vmax:         81.50 cm/s LVOT Vmean:        57.300 cm/s LVOT VTI:          0.148 m LVOT/AV VTI ratio: 0.67 AI PHT:            354 msec  AORTA Ao Root diam: 3.70 cm MITRAL VALVE               TRICUSPID VALVE MV Area (PHT): 2.73 cm    TR Peak grad:   20.8 mmHg MV Peak grad:  2.7 mmHg    TR Vmax:        228.00 cm/s MV Mean grad:  1.0 mmHg MV Vmax:       0.81 m/s    SHUNTS MV Vmean:      44.8 cm/s   Systemic VTI:  0.15 m MV Decel Time: 278 msec    Systemic Diam: 1.90 cm MV E velocity: 58.30 cm/s MV A velocity: 70.30 cm/s MV E/A ratio:  0.83 Lyman Bishop MD Electronically signed by Lyman Bishop MD Signature Date/Time: 12/04/2019/6:48:21 PM    Final     Anti-infectives: Anti-infectives (From admission, onward)   None       Assessment/Plan 74Ms/p mechanicalfall SDH/SAH - Per Dr. Christella Noa of [place]. Coumadin reversed w/ K Centra (9/12). INR 1.3 this AM. Repeat CTH 9/13 with stable SDH, slightly increased SAH. Keppra. TBI therapies. Await further NSGY recs.   Large right side PTX with subcutaneous emphysema - S/p CT. CXR this AM without PTX. Place CT to [facility]. CXR in  the AM. Pulm toilet.  Right sided 2-4 rib fractures - Multimodal pain control. Pulm toilet. PT/OT HTN - soft BP overnight.  Hold home meds. Restart when BP improved. Discussed with RN Hx of CHF - Echo 9/13 w/ EF 30-35%, severe akinesis, aneurysmal apex. There was an inferoapical filling defect felt to less to be a thrombus. Will ask cards to comment on this. Cont telemetry. No evidence of fluid overload at this time. Holding Lasix given soft BP. Cont gentle fluids (84ml/hr). Restart Entresto and Propanolol when BP improved.  OSA -continue home CPAP Hx CAD (prior LHC 07/2019) H/o recurrent DVT (+ Lupus anticoagulant) - Coumadin reversed w/ K Centra (9/12). INR 1.3 this AM. Holding Coumadin. Daily INR and CBC. NSGY recommending no anticoagulation in the future with frequent falls (3 falls in the last 3 weeks). Will consult vascular for possible IVC filter placement.  Infrarenal AAA - will discuss with Vascular when consulting about IVC filter Multinodular Thyroid - Will need outpatient thyroid US Urinary retention - Foley. Titrate Urecholine. Possible removal tomorrow.  FEN - Reg, gentle IVF. Home PPI. Bowel regimen.  VTE - SCDs, hold chemical prophylaxis  ID - None  Foley - In place. Plan as above  Dispo - TBI therapies. PT currently recommending CIR. Lives at home with his wife. Vascular consult. Monitor BP. Hold home meds. Cardiology consult to comment on echo findings.    LOS: 2 days    Jillyn Ledger , [facility] 12/05/2019, 8:11 AM Please see Amion for pager number during day hours 7:00am-4:30pm

## 2019-12-05 NOTE — Progress Notes (Signed)
Patient ID: Kevin Hart, male   DOB: 03/31/1945, 74 y.o.   MRN: [id number] Dr.Feng's consult reviewed.  Agree with discontinuing anticoagulation and not placing a vena cava filter.  Could place filter if he does develop DVT and or pulmonary embolus.  Seems that family may be leaning towards comfort care.  Will not follow actively.  Please call if we can provide any assistance.

## 2019-12-05 NOTE — Progress Notes (Signed)
Inpatient Rehabilitation Admissions Coordinator  I met with patient at bedside with his daughter. We discussed goals and expectations of a possible Cir admit. Patient has a lot of family support an they prefer Cir admit. I will await to begin insurance authorization when close to medically ready for Cir admit. I will follow.  Danne Baxter, RN, MSN Rehab Admissions Coordinator [phone] 12/05/2019 1:59 PM

## 2019-12-05 NOTE — Progress Notes (Signed)
Patient is becoming more disoriented . He states it is 80 and he is unable to tell the name of the hospital he is also asking if his wife has left and she has not been at the hospital today. His daughter is at the bedside and is very concerned. Romana Juniper PA paged with this update

## 2019-12-05 NOTE — Progress Notes (Signed)
Physical Therapy Treatment Patient Details Name: Kevin Hart MRN: [id number] DOB: 1945-05-22 Today's Date: 12/05/2019    History of Present Illness 74 yo male s/p fall with R cerebellum SDH SAH R side 2-4 rib fxs and Large R PTX with subcutaneous emphysema Pt iwth x3 falls in 3 weeks.  PMH OSA CAD DVT HTN arthritis bil cataracts CHF lupus MI 2015 s/p fall with SAH scoliosis    PT Comments    Pt wanting to get out of the chair on arrival.  Emphasis on warm up/resistive LE exercise and standing trials with transfer safety and pregait the main activity.  All mobility was very brady kinetic, balance biased posteriorly.  Pt able to only attain submaximal stance and unable to fully clear his R foot with w/shift assisted L.    Follow Up Recommendations  CIR;Supervision/Assistance - 24 hour     Equipment Recommendations  3in1 (PT)    Recommendations for Other Services Rehab consult     Precautions / Restrictions Precautions Precautions: Fall Precaution Comments: foley R chest tube on suction    Mobility  Bed Mobility Overal bed mobility: Needs Assistance Bed Mobility: Sit to Sidelying         Sit to sidelying: Max assist;+2 for safety/equipment General bed mobility comments: down to R elbow, pt slow to respond with movement to command.  LE's needed assist to move into bed.  Transfers Overall transfer level: Needs assistance Equipment used: Rolling walker (2 wheeled) Transfers: Sit to/from [facility] Sit to Stand: Max assist (x3 from the recliner) Stand pivot transfers: Max assist;+2 safety/equipment       General transfer comment: cues for hand placement and sequencing or hands coming to the RW for safety.  Assist to come forward and boost/stabilize while pt slowly rising.  Ambulation/Gait             General Gait Details: pregait activity only incl w/shift, unweighting and clearing heels/  Pt unable to lift R foot. see standing  comments.   Stairs             Wheelchair Mobility    Modified Rankin (Stroke Patients Only)       Balance Overall balance assessment: Needs assistance Sitting-balance support: Bilateral upper extremity supported;Single extremity supported;Feet unsupported Sitting balance-Leahy Scale: Poor Sitting balance - Comments: biased posteriorly, reaction times very slow. Postural control: Left lateral lean;Posterior lean Standing balance support: Bilateral upper extremity supported;During functional activity Standing balance-Leahy Scale: Poor Standing balance comment: 2 standing/pregait trials working on standing more erect with shd/scapular retraction and knees extended.  Worked on w/shift, unweighting and attempting to step in place.  pt having difficulty staying forward and w/shifting to the left while unweighting the R LE.                            Cognition Arousal/Alertness: Awake/alert Behavior During Therapy: Flat affect Overall Cognitive Status: Impaired/Different from baseline (NT formally, follows commands and is a fair historian)                                        Exercises Other Exercises Other Exercises: Warm up hip/knee ROM exercise prior to mobility with graded resistance in extension with stress on knee presses at terminal extension x 10 reps.    General Comments        Pertinent Vitals/Pain Pain  Assessment: Faces Faces Pain Scale: Hurts a little bit Pain Location: general Pain Intervention(s): Monitored during session    Home Living                      Prior Function            PT Goals (current goals can now be found in the care plan section) Acute Rehab PT Goals PT Goal Formulation: With patient Time For Goal Achievement: 12/18/19 Potential to Achieve Goals: Good Progress towards PT goals: Progressing toward goals    Frequency    Min 3X/week      PT Plan Current plan remains appropriate     Co-evaluation              AM-PAC PT "6 Clicks" Mobility   Outcome Measure  Help needed turning from your back to your side while in a flat bed without using bedrails?: Total Help needed moving from lying on your back to sitting on the side of a flat bed without using bedrails?: Total Help needed moving to and from a bed to a chair (including a wheelchair)?: Total Help needed standing up from a chair using your arms (e.g., wheelchair or bedside chair)?: Total Help needed to walk in hospital room?: Total Help needed climbing 3-5 steps with a railing? : Total 6 Click Score: 6    End of Session   Activity Tolerance: Patient tolerated treatment well;Patient limited by fatigue Patient left: in bed;with call bell/phone within reach;with family/visitor present;with bed alarm set (left with SLP) Nurse Communication: Mobility status PT Visit Diagnosis: Unsteadiness on feet (R26.81);Other abnormalities of gait and mobility (R26.89);Muscle weakness (generalized) (M62.81);Difficulty in walking, not elsewhere classified (R26.2)     Time: 1224-1300 PT Time Calculation (min) (ACUTE ONLY): 36 min  Charges:  $Therapeutic Activity: 23-37 mins                     12/05/2019  Ginger Carne., PT Acute Rehabilitation Services [phone]  (pager) [phone]  (office)   [name] 12/05/2019, 1:19 PM

## 2019-12-05 NOTE — Progress Notes (Signed)
Patient ID: Kevin Hart, male   DOB: 1945/08/09, 74 y.o.   MRN: [id number] BP 95/75   Pulse (!) 101   Temp 99.6 F (37.6 C) (Axillary)   Resp 20   Ht 5\' 10"  (1.778 m)   Wt 93 kg   SpO2 91%   BMI 29.41 kg/m  Alert, follows commands Concern for possible parkinson's disease Will consult neurology Head ct showed no change No anticoagulation

## 2019-12-06 ENCOUNTER — Inpatient Hospital Stay (HOSPITAL_COMMUNITY): Payer: PPO

## 2019-12-06 LAB — CBC
HCT: 38.3 % — ABNORMAL LOW (ref 39.0–52.0)
Hemoglobin: 12.6 g/dL — ABNORMAL LOW (ref 13.0–17.0)
MCH: 29.8 pg (ref 26.0–34.0)
MCHC: 32.9 g/dL (ref 30.0–36.0)
MCV: 90.5 fL (ref 80.0–100.0)
Platelets: 182 10*3/uL (ref 150–400)
RBC: 4.23 MIL/uL (ref 4.22–5.81)
RDW: 13.9 % (ref 11.5–15.5)
WBC: 7.5 10*3/uL (ref 4.0–10.5)
nRBC: 0 % (ref 0.0–0.2)

## 2019-12-06 LAB — BASIC METABOLIC PANEL
Anion gap: 8 (ref 5–15)
BUN: 12 mg/dL (ref 8–23)
CO2: 24 mmol/L (ref 22–32)
Calcium: 9.1 mg/dL (ref 8.9–10.3)
Chloride: 105 mmol/L (ref 98–111)
Creatinine, Ser: 0.98 mg/dL (ref 0.61–1.24)
GFR calc Af Amer: >60 mL/min (ref >60)
GFR calc non Af Amer: >60 mL/min (ref >60)
Glucose, Bld: 154 mg/dL — ABNORMAL HIGH (ref 70–99)
Potassium: 3.4 mmol/L — ABNORMAL LOW (ref 3.5–5.1)
Sodium: 137 mmol/L (ref 135–145)

## 2019-12-06 LAB — PROTIME-INR
INR: 1.4 — ABNORMAL HIGH (ref 0.8–1.2)
Prothrombin Time: 16.5 s — ABNORMAL HIGH (ref 11.4–15.2)

## 2019-12-06 MED ORDER — CARVEDILOL 3.125 MG PO TABS
3.1250 mg | ORAL_TABLET | Freq: Two times a day (BID) | ORAL | Status: DC
  Administered 2019-12-06 – 2019-12-12 (×12): 3.125 mg via ORAL
  Filled 2019-12-06 (×13): qty 1

## 2019-12-06 MED ORDER — POLYETHYLENE GLYCOL 3350 17 G PO PACK
17.0000 g | PACK | Freq: Two times a day (BID) | ORAL | Status: DC
  Administered 2019-12-06 – 2019-12-09 (×7): 17 g via ORAL
  Filled 2019-12-06 (×7): qty 1

## 2019-12-06 MED ORDER — POTASSIUM CHLORIDE CRYS ER 20 MEQ PO TBCR
40.0000 meq | EXTENDED_RELEASE_TABLET | Freq: Two times a day (BID) | ORAL | Status: AC
  Administered 2019-12-06 (×2): 40 meq via ORAL
  Filled 2019-12-06 (×2): qty 2

## 2019-12-06 MED ORDER — FUROSEMIDE 10 MG/ML IJ SOLN
20.0000 mg | Freq: Once | INTRAMUSCULAR | Status: AC
  Administered 2019-12-06: 20 mg via INTRAVENOUS
  Filled 2019-12-06: qty 2

## 2019-12-06 MED ORDER — LEVETIRACETAM 500 MG PO TABS
500.0000 mg | ORAL_TABLET | Freq: Two times a day (BID) | ORAL | Status: DC
  Administered 2019-12-06 – 2019-12-13 (×14): 500 mg via ORAL
  Filled 2019-12-06 (×15): qty 1

## 2019-12-06 MED ORDER — SODIUM CHLORIDE 0.9 % IV SOLN: INTRAVENOUS | Status: DC

## 2019-12-06 NOTE — Progress Notes (Signed)
Attempted to get patient OOB with three person assist with front wheel walker. We were unable to get OOB and placed patient in chair position. Will call PT to work with patient again. Modena Morrow E, RN 12/06/2019 10:46 AM

## 2019-12-06 NOTE — TOC CAGE-AID Note (Signed)
Transition of Care [facility]) - CAGE-AID Screening   Patient Details  Name: Kevin Hart MRN: [id number] Date of Birth: [birth year]  [facility]) CM/SW Contact:    Emeterio Reeve, [place] Phone Number: 12/06/2019, 12:58 PM   Clinical Narrative:  Pt unable to participate in assessment due to not being fully oriented.   CAGE-AID Screening: Substance Abuse Screening unable to be completed due to: : Patient unable to participate                  Providence Crosby Clinical Social Worker [phone]

## 2019-12-06 NOTE — Progress Notes (Signed)
Attempted to call patients family without answer. Will try again later.

## 2019-12-06 NOTE — Progress Notes (Signed)
Patient refused use of CPAP for this evening.

## 2019-12-06 NOTE — Progress Notes (Signed)
Placed patient on BIpap for the night with IPAP set at 10cm and EPAP set at 5cm.

## 2019-12-06 NOTE — Progress Notes (Signed)
Patient ID: Kevin Hart, male   DOB: August 21, 1945, 74 y.o.   MRN: [id number] BP (!) 110/94   Pulse (!) 116   Temp 98.1 F (36.7 C) (Oral)   Resp (!) 22   Ht 5\' 10"  (1.778 m)   Wt 93 kg   SpO2 91%   BMI 29.41 kg/m  Alert and oriented x4 Resting tremor Following all commands Waiting for transfer out of unit Neurology to see tomorrow

## 2019-12-06 NOTE — Progress Notes (Signed)
Subjective: CC: Patient reported to have confusion and disorientation yesterday PM.  Patient able to tell me his name and year. Originally thought that he was in [place] but then corrected to [place]. He knows he is in a hospital but not which one. He is not sure why he is here.  Notes HA. No other complaints. Tolerating diet. On bedpan currently. No BM yesterday. Foley in place.  Notes reviewed from yesterday.   Objective: Vital signs in last 24 hours: Temp:  [98.3 F (36.8 C)-99.6 F (37.6 C)] 98.3 F (36.8 C) (09/15 0700) Pulse Rate:  [74-104] 100 (09/15 0746) Resp:  [10-20] 20 (09/15 0746) BP: (91-145)/(46-126) 137/73 (09/15 0700) SpO2:  [89 %-96 %] 91 % (09/15 0700) Last BM Date: 12/03/19  Intake/Output from previous day: 09/14 0701 - 09/15 0700 In: 2212.2 [P.O.:740; I.V.:1172.6; IV Piggyback:299.7] Out: 2925 [Urine:2825; Chest Tube:100] Intake/Output this shift: No intake/output data recorded.  PE: Gen: Alert, NAD, pleasant HEENT:PERRL.EOM's intact, pupils equal and round Card: Tachycardia 100-105 Pulm: CTAB, no W/R/R, effort normal. R chest tube in place on -20 still. No air leak. 100cc bloody output in cannister/24 hours. Dressing c/d/i Abd: Soft, NT/ND, +BS XBM:WUXLK grip strength b/l. Able to raise legs off bed b/l.  Psych: A&Ox3  Skin: no rashes noted, warm and dry   Lab Results:  Recent Labs    12/05/19 0145 12/06/19 0108  WBC 7.8 7.5  HGB 12.7* 12.6*  HCT 38.2* 38.3*  PLT 184 182   BMET Recent Labs    12/05/19 0850 12/06/19 0108  NA 136 137  K 3.8 3.4*  CL 102 105  CO2 27 24  GLUCOSE 127* 154*  BUN 12 12  CREATININE 1.05 0.98  CALCIUM 9.4 9.1   PT/INR Recent Labs    12/05/19 0145 12/06/19 0108  LABPROT 15.5* 16.5*  INR 1.3* 1.4*   CMP     Component Value Date/Time   NA 137 12/06/2019 0108   NA 138 11/09/2017 1618   K 3.4 (L) 12/06/2019 0108   CL 105 12/06/2019 0108   CO2 24 12/06/2019 0108   GLUCOSE 154 (H)  12/06/2019 0108   BUN 12 12/06/2019 0108   BUN 21 11/09/2017 1618   CREATININE 0.98 12/06/2019 0108   CREATININE 1.09 10/17/2015 1154   CALCIUM 9.1 12/06/2019 0108   PROT 6.8 12/03/2019 1036   PROT 7.0 09/14/2018 0839   ALBUMIN 3.4 (L) 12/03/2019 1036   ALBUMIN 4.6 09/14/2018 0839   AST 18 12/03/2019 1036   ALT 12 12/03/2019 1036   ALKPHOS 32 (L) 12/03/2019 1036   BILITOT 1.5 (H) 12/03/2019 1036   BILITOT 0.6 09/14/2018 0839   GFRNONAA >60 12/06/2019 0108   GFRAA >60 12/06/2019 0108   Lipase     Component Value Date/Time   LIPASE 40 10/25/2017 2349       Studies/Results: CT HEAD WO CONTRAST  Result Date: 12/05/2019 CLINICAL DATA:  Fall 5 days prior to admission multiple falls EXAM: CT HEAD WITHOUT CONTRAST TECHNIQUE: Contiguous axial images were obtained from the base of the skull through the vertex without intravenous contrast. COMPARISON:  Most recent CT 12/04/2019 FINDINGS: Brain: Redemonstration of the heterogeneous, hyperdense extra-axial hemorrhage seen along the right tentorial leaflet with associated mass effect on the adjacent right cerebellum and into a lesser extent pons including some partial effacement of the fourth ventricle. Size measures approximately 3 x 1.8 x 3.2 cm, which is grossly similar to comparison accounting for differences in patient positioning  and slight redistribution. Additional blood layers along the right tentorium. Scattered foci of additional subarachnoid hemorrhage is present along the right cerebellar hemisphere, right parietooccipital and temporal lobes, and possibly within several of the sulci of the left occipital and parietal lobes as well. No new large parenchymal hemorrhage is identified. No evidence of acute infarction or developing hydrocephalus. Symmetric prominence of the ventricles, cisterns and sulci compatible with parenchymal volume loss and ex vacuo dilatation. Patchy areas of white matter hypoattenuation are most compatible with  chronic microvascular angiopathy. Vascular: Atherosclerotic calcification of the carotid siphons and intradural vertebral arteries. No hyperdense vessel. Skull: No calvarial fracture or suspicious osseous lesion. No scalp swelling or hematoma. Mild soft tissue thickening along the left frontotemporal scalp. Some additional contusive changes of the left occipital scalp as well. No subjacent calvarial or visible skull base fractures. Some age indeterminate deformities of the nasal bones. Sinuses/Orbits: Paranasal sinuses and mastoid air cells are predominantly clear. Orbital structures are unremarkable aside from prior lens extractions. Other: Extensive soft tissue emphysema within the superficial and deep spaces of the included portions of the neck. IMPRESSION: 1. Redemonstration of the heterogeneous, hyperdense extra-axial hemorrhage along the right tentorial leaflet with associated mass effect on the adjacent right cerebellum and into a lesser extent pons including some partial effacement of the fourth ventricle. Size measures approximately 3 x 1.8 x 3.2 cm, which is grossly similar to comparison accounting for differences in patient positioning and slight redistribution. 2. Scattered foci of additional subarachnoid hemorrhage along the right cerebellar hemisphere, right parietooccipital and temporal lobes, and possibly within several of the sulci of the left occipital and parietal lobes as well. 3. No new large parenchymal hemorrhage is identified. No evidence of acute infarction or developing hydrocephalus. 4. Extensive soft tissue emphysema within the superficial and deep spaces of the included portions of the neck. 5. No Calvarial or visible skull base fractures. 6. Several sites of scalp swelling/infiltration including the left temporal and occipital scalp. 7. Some age indeterminate deformities of the nasal bones. Correlate for point tenderness. Electronically Signed   By: Lovena Le M.D.   On: 12/05/2019  22:29   DG CHEST PORT 1 VIEW  Result Date: 12/06/2019 CLINICAL DATA:  Follow-up pneumothorax EXAM: PORTABLE CHEST 1 VIEW COMPARISON:  12/05/2019 FINDINGS: Pigtail catheter is again noted on the right. No definitive pneumothorax is seen. Persistent subcutaneous emphysema is seen. Cardiac shadow is stable. Defibrillator is again noted. No focal infiltrate is seen. IMPRESSION: No evidence of pneumothorax. Electronically Signed   By: Inez Catalina M.D.   On: 12/06/2019 06:52   DG CHEST PORT 1 VIEW  Result Date: 12/05/2019 CLINICAL DATA:  Pneumothorax. EXAM: PORTABLE CHEST 1 VIEW COMPARISON:  Yesterday FINDINGS: Right-sided chest tube in place. No visible pneumothorax. Extensive chest wall emphysema that is not clearly changed when accounting for differences in coverage. Cardiomegaly and pulmonary infiltrates. Coronary stenting. Defibrillator lead from the left in stable position. IMPRESSION: No visible pneumothorax. Electronically Signed   By: Monte Fantasia M.D.   On: 12/05/2019 06:58   ECHOCARDIOGRAM COMPLETE  Result Date: 12/04/2019    ECHOCARDIOGRAM REPORT   Patient Name:   SUHAYB ANZALONE Date of Exam: 12/04/2019 Medical Rec #:  [id number]       Height:       70.0 in Accession #:    [id number]      Weight:       205.0 lb Date of Birth:  1945/05/21       BSA:  2.109 m Patient Age:    74 years        BP:           99/53 mmHg Patient Gender: M               HR:           60 bpm. Exam Location:  Inpatient Procedure: 2D Echo, Intracardiac Opacification Agent, Color Doppler and Cardiac            Doppler Indications:    History of congestive heart disease  History:        Patient has prior history of Echocardiogram examinations, most                 recent 08/12/2019. CHF, CAD and Previous Myocardial Infarction,                 Defibrillator; Risk Factors:Dyslipidemia and Sleep Apnea. H/O                 DVT. Ischemic cardiomyopathy.  Sonographer:    Clayton Lefort RDCS (AE) Referring Phys: [id number] [facility]  Sonographer Comments: Suboptimal parasternal window and no subcostal window. IMPRESSIONS  1. There is an inferoapical filling defect by Definity contrast that is likely coarse trabeculation of the ventricular wall and is noted to flatten after a PVC, making a thrombus less likely. Left ventricular ejection fraction, by estimation, is 30 to 35%. The left ventricle has moderately decreased function. The left ventricle demonstrates regional wall motion abnormalities (see scoring diagram/findings for description). There is mild left ventricular hypertrophy. Left ventricular diastolic parameters are consistent with Grade I diastolic dysfunction (impaired relaxation). There is severe akinesis of the left ventricular, entire anterior wall, anteroseptal wall, anterior segment and inferoapical segment. The apex appears aneurysmal.  2. An AICD is visualized.  3. The mitral valve is abnormal. Trivial mitral valve regurgitation.  4. The aortic valve is tricuspid. Aortic valve regurgitation is mild. Mild aortic valve sclerosis is present, with no evidence of aortic valve stenosis. Aortic valve mean gradient measures 3.0 mmHg. Comparison(s): Changes from prior study are noted. 08/12/2019: LVEF 25-30%, diffuse HK and apical aneurysm. FINDINGS  Left Ventricle: There is an inferoapical filling defect by Definity contrast that is likely coarse trabeculation of the ventricular wall and is noted to flatten after a PVC, making a thrombus less likely. Left ventricular ejection fraction, by estimation, is 30 to 35%. The left ventricle has moderately decreased function. The left ventricle demonstrates regional wall motion abnormalities. Severe akinesis of the left ventricular, entire anterior wall, anteroseptal wall, anterior segment and inferoapical segment. Definity contrast agent was given IV to delineate the left ventricular endocardial borders. The left ventricular internal cavity size was normal in size. There is mild left  ventricular hypertrophy. Left ventricular diastolic parameters are consistent with Grade I diastolic dysfunction (impaired relaxation). Indeterminate filling pressures. Right Ventricle: The right ventricular size is normal. No increase in right ventricular wall thickness. Right ventricular systolic function is normal. Left Atrium: Left atrial size was normal in size. Right Atrium: Right atrial size was normal in size. Pericardium: There is no evidence of pericardial effusion. Mitral Valve: The mitral valve is abnormal. There is mild thickening of the mitral valve leaflet(s). There is mild calcification of the mitral valve leaflet(s). Trivial mitral valve regurgitation. MV peak gradient, 2.7 mmHg. The mean mitral valve gradient is 1.0 mmHg. Tricuspid Valve: The tricuspid valve is grossly normal. Tricuspid valve regurgitation is mild. Aortic Valve: The aortic  valve is tricuspid. Aortic valve regurgitation is mild. Aortic regurgitation PHT measures 354 msec. Mild aortic valve sclerosis is present, with no evidence of aortic valve stenosis. Aortic valve mean gradient measures 3.0 mmHg. Aortic valve peak gradient measures 5.0 mmHg. Aortic valve area, by VTI measures 1.91 cm. Pulmonic Valve: The pulmonic valve was grossly normal. Pulmonic valve regurgitation is not visualized. Aorta: The aortic root and ascending aorta are structurally normal, with no evidence of dilitation. IAS/Shunts: No atrial level shunt detected by color flow Doppler. Additional Comments: A n AICD is noted is visualized.  LEFT VENTRICLE PLAX 2D LVIDd:         5.20 cm  Diastology LVIDs:         3.90 cm  LV e' medial:    5.11 cm/s LV PW:         1.10 cm  LV E/e' medial:  11.4 LV IVS:        1.30 cm  LV e' lateral:   7.51 cm/s LVOT diam:     1.90 cm  LV E/e' lateral: 7.8 LV SV:         42 LV SV Index:   20 LVOT Area:     2.84 cm  RIGHT VENTRICLE RV Basal diam:  3.20 cm RV S prime:     11.20 cm/s TAPSE (M-mode): 3.1 cm LEFT ATRIUM             Index        RIGHT ATRIUM           Index LA diam:        2.50 cm 1.19 cm/m  RA Area:     14.90 cm LA Vol (A2C):   34.5 ml 16.36 ml/m RA Volume:   35.10 ml  16.64 ml/m LA Vol (A4C):   51.3 ml 24.32 ml/m LA Biplane Vol: 45.2 ml 21.43 ml/m  AORTIC VALVE AV Area (Vmax):    2.06 cm AV Area (Vmean):   1.95 cm AV Area (VTI):     1.91 cm AV Vmax:           112.00 cm/s AV Vmean:          83.200 cm/s AV VTI:            0.220 m AV Peak Grad:      5.0 mmHg AV Mean Grad:      3.0 mmHg LVOT Vmax:         81.50 cm/s LVOT Vmean:        57.300 cm/s LVOT VTI:          0.148 m LVOT/AV VTI ratio: 0.67 AI PHT:            354 msec  AORTA Ao Root diam: 3.70 cm MITRAL VALVE               TRICUSPID VALVE MV Area (PHT): 2.73 cm    TR Peak grad:   20.8 mmHg MV Peak grad:  2.7 mmHg    TR Vmax:        228.00 cm/s MV Mean grad:  1.0 mmHg MV Vmax:       0.81 m/s    SHUNTS MV Vmean:      44.8 cm/s   Systemic VTI:  0.15 m MV Decel Time: 278 msec    Systemic Diam: 1.90 cm MV E velocity: 58.30 cm/s MV A velocity: 70.30 cm/s MV E/A ratio:  0.83 Lyman Bishop MD Electronically signed by Lyman Bishop MD Signature  Date/Time: 12/04/2019/6:48:21 PM    Final     Anti-infectives: Anti-infectives (From admission, onward)   None       Assessment/Plan 74Ms/pmechanicalfall SDH/SAH- Per Dr. Christella Noa of [place]. Coumadin reversed w/ K Centra (9/12). INR 1.4 this AM. Repeat CTH 9/13 with stable SDH, slightly increased SAH. Repeat [facility] 9/14 PM w/ hemorrhage of the right tentorial leaflet w/ mass effect that is stable + scattered foci of SAH.  Keppra. TBI therapies. NSGY wrote they have consulted Neurology for concerns for possible parkinson's disease. Will follow up on Neurology recs. Noted patient is on D5. Switched to NS.  Large right side PTX with subcutaneous emphysema- S/p CT. CXR this AM without PTX. Ordered placed for WS yesterday. Was still on -20 this morning. WS this AM. CXR in the AM. Pulm toilet. Right sided 2-4 rib fractures- Multimodal  pain control. Pulm toilet. PT/OT HTN- soft BP yesterday. Home meds held. Cards has seen and started on low dose Coreg and Lasix. Monitor BP today and restart home meds when BP improved. Discussed with RN Hx ofCHF-Echo 9/13 w/ EF 30-35%, severe akinesis, aneurysmal apex. There was an inferoapical filling defect felt to less to be a thrombus. Cards consulted and felt this is likely artifact/shadowing. Conttelemetry. No evidence of fluid overload at this time. Holding Lasix given soft BP. Cont gentle fluids (45ml/hr). Restart Entresto and Propanolol when BP improved.  PVC's - PVC's noted on tele. Will await further recs from cards.   OSA- continue home CPAP. Patient refused last night and o2 dropped to 87. No hypoxia this AM Hx CAD(prior LHC 07/2019) - cards following H/o recurrent DVT(+ Lupus anticoagulant) - Coumadin reversed w/ K Centra (9/12). INR 1.4 this AM. Holding Coumadin. Daily INR and CBC. NSGY recommending no anticoagulation in the future with frequent falls (3 falls in the last 3 weeks).Vascular and hematology has seen the patient. No indication for IVC filter at this time.  Infrarenal AAA - Per Vascular. They recommend follow up US in 1 year Multinodular Thyroid - Will need outpatient thyroid US Urinary retention - Voiding trial today. Continue Urecholine.  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 NT on exam.  Hypokalemia - replace. On lasix. AM labs  ABL Anemia - hgb stable at 12.6 FEN -Reg, gentle IVF (change to NS). Home PPI. Bowel regimen. VTE -SCDs, hold chemical prophylaxis ID -None. UA and BCx ordered for confusion 9/14 PM. Will follow Foley - Voiding trial today.  Dispo - TBI therapies. PT currently recommending CIR. Lives at home with his wife.There is mention in notes about patients family wanting palliative consult. I will talk with family today about this. Will discuss with attending about transferring to progressive floor.      LOS: 3  days    Jillyn Ledger , [facility] 12/06/2019, 8:07 AM Please see Amion for pager number during day hours 7:00am-4:30pm

## 2019-12-07 ENCOUNTER — Encounter: Payer: PPO | Admitting: Physical Therapy

## 2019-12-07 ENCOUNTER — Inpatient Hospital Stay (HOSPITAL_COMMUNITY): Payer: PPO

## 2019-12-07 DIAGNOSIS — S065X0A Traumatic subdural hemorrhage without loss of consciousness, initial encounter: Secondary | ICD-10-CM | POA: Diagnosis not present

## 2019-12-07 DIAGNOSIS — S066X0A Traumatic subarachnoid hemorrhage without loss of consciousness, initial encounter: Secondary | ICD-10-CM | POA: Diagnosis not present

## 2019-12-07 DIAGNOSIS — G20 Parkinson's disease: Secondary | ICD-10-CM

## 2019-12-07 LAB — BASIC METABOLIC PANEL
Anion gap: 7 (ref 5–15)
BUN: 14 mg/dL (ref 8–23)
CO2: 26 mmol/L (ref 22–32)
Calcium: 9.9 mg/dL (ref 8.9–10.3)
Chloride: 107 mmol/L (ref 98–111)
Creatinine, Ser: 0.99 mg/dL (ref 0.61–1.24)
GFR calc Af Amer: >60 mL/min (ref >60)
GFR calc non Af Amer: >60 mL/min (ref >60)
Glucose, Bld: 143 mg/dL — ABNORMAL HIGH (ref 70–99)
Potassium: 4.1 mmol/L (ref 3.5–5.1)
Sodium: 140 mmol/L (ref 135–145)

## 2019-12-07 LAB — PROTIME-INR
INR: 1.3 — ABNORMAL HIGH (ref 0.8–1.2)
Prothrombin Time: 15.5 s — ABNORMAL HIGH (ref 11.4–15.2)

## 2019-12-07 LAB — CBC
HCT: 39.4 % (ref 39.0–52.0)
Hemoglobin: 13.2 g/dL (ref 13.0–17.0)
MCH: 30.9 pg (ref 26.0–34.0)
MCHC: 33.5 g/dL (ref 30.0–36.0)
MCV: 92.3 fL (ref 80.0–100.0)
Platelets: 204 10*3/uL (ref 150–400)
RBC: 4.27 MIL/uL (ref 4.22–5.81)
RDW: 14.2 % (ref 11.5–15.5)
WBC: 7 10*3/uL (ref 4.0–10.5)
nRBC: 0 % (ref 0.0–0.2)

## 2019-12-07 LAB — URINALYSIS, ROUTINE W REFLEX MICROSCOPIC
Bilirubin Urine: NEGATIVE
Glucose, UA: NEGATIVE mg/dL
Ketones, ur: NEGATIVE mg/dL
Nitrite: NEGATIVE
Protein, ur: NEGATIVE mg/dL
RBC / HPF: >50 RBC/hpf — ABNORMAL HIGH (ref 0–5)
Specific Gravity, Urine: 1.015 (ref 1.005–1.030)
pH: 5 (ref 5.0–8.0)

## 2019-12-07 MED ORDER — OXYCODONE HCL 5 MG PO TABS
5.0000 mg | ORAL_TABLET | ORAL | Status: DC | PRN
  Administered 2019-12-09: 5 mg via ORAL
  Filled 2019-12-07: qty 1

## 2019-12-07 MED ORDER — MORPHINE SULFATE (PF) 2 MG/ML IV SOLN: 1.0000 mg | INTRAVENOUS | Status: DC | PRN

## 2019-12-07 NOTE — Consult Note (Signed)
NEURO HOSPITALIST CONSULT NOTE   Requestig physician: Dr. Cyndy Freeze    Reason for Consult: Possible Parkinson's    History obtained from:  Patient and Chart   HPI:                                                                                                                                          Kevin Hart is an 74 y.o. male history of CAD status post CPI, CHF, Hypertension, positive lupus anticoagulant, history of recurrent DVT, sleep apnea.  10/12/19: Presented after a fall, normally on coumadin, but held for a spinal epidural injection. Patient reported he fell due to his his chronic back problems. Diagnosed subdural hematoma. Discharged 7/23 with plan to hold warfarin and ASA for 10 days then resume. Keppra for 1 week. Placed in outpatient PT.    10/25/19: Returned after another fall due to losing balance and hit his head. No acute injury on imaging. Neuro exam non focal.   10/30/19: Cleared to resume Coumadin  12/03/19: Complaints of urinary retention 1L Volume and foley placed. Wife reported another fall 4 days prior to presentation and was dizzy and confused since that time.On admission small pneumothorax on right identified with rib fracture. Chest tube placed. Head CT showed right cerbellum subdural and subarachnoid hemorrhage. Kcentra given. During this admission it has been determined that he will not resume anticoagulation with involvement of neurosurgery, vascular and hematology, and an IVC filter will be placed in the future if needed. Plans to discontinue chest tube today. Now planning for CIR. Concern for shuffling gait, tremor and possible parkinson's. Neurology consulted.   In speaking with his wife and daughter over the phone. He has lost interest in his normal activities over the past 2 years. A provided example was that he used to very much enjoy geneology and no longer has any interest. They have talked to him about depression, but he said he is not  suicidal therefore he is not depressed and he did not want to pursue any type of treatment. About a year ago they started to notice a tremor in hands, most obvious when he is trying to write per his wife. She has not noticed any specific changes in the size of his writing.  They also noticed a shuffling gait, however he does also have severe chronic back pain, which might be a contributing factor. His daughter shared that he had that spinal injection over the summer and his shuffling gait nearly resolved when the pain improved. They do report he is adopted and they've heard that his real father may have had Parkinson's, however they are unsure. He does have a half sister that they might be able to reach out to for more information. Family was quite worried about his severe  confusion this admission and asked about hospice if he had no chance of getting better. He is now improving and interacting with them. They are hopeful he will be able to attend rehab.     Past Medical History:  Diagnosis Date  . Arthritis   . Bladder stone 10/26/2017  . BPH (benign prostatic hyperplasia)    takes Flomax daily  . CAD (coronary artery disease)    a. prior LAD stenting;  b. 06/2014 NSTEMI/PCI: LM nl, LAD 30p, 53m ISR (3.0x20 Promus DES), LCX nl, RCA nondom, nl, EF 25-30% ant, apical, dist inf AK.  . Cataracts, bilateral    immature  . Chronic systolic CHF (congestive heart failure) ([place]) 12/31/2014  . DVT (deep venous thrombosis) ([facility])    RECURRENT left leg  . Dyslipidemia   . GERD (gastroesophageal reflux disease)   . Gout   . History of kidney stones   . Hypertension   . Ischemic cardiomyopathy 07/06/2014  . Lupus anticoagulant positive   . Myocardial infarct ([place]) 2000  . Myocardial infarct ([place]) 2016  . S/P drug eluting coronary stent placement, 07/06/14, Promus 07/07/2014  . Sleep apnea    biPaP  . Subarachnoid bleed ([place]) 2015   after falling and hitting his head    Past Surgical History:   Procedure Laterality Date  . COLONOSCOPY    . EP IMPLANTABLE DEVICE [name] 11/27/2014   [employer] Fortify Assura VR  implanted by Dr Rayann Heman for primary prevention  . LEFT HEART CATH AND CORONARY ANGIOGRAPHY [name] 07/29/2018   Procedure: LEFT HEART CATH AND CORONARY ANGIOGRAPHY;  Surgeon: Sherren Mocha, MD;  Location: [place] [facility];  Service: Cardiovascular;  Laterality: [name];  . LEFT HEART CATH AND CORONARY ANGIOGRAPHY [name] 08/14/2019   Procedure: LEFT HEART CATH AND CORONARY ANGIOGRAPHY;  Surgeon: Troy Sine, MD;  Location: [place] [facility];  Service: Cardiovascular;  Laterality: [name];  . LEFT HEART CATHETERIZATION WITH CORONARY ANGIOGRAM [name] 07/06/2014   Procedure: LEFT HEART CATHETERIZATION WITH CORONARY ANGIOGRAM;  Surgeon: Peter M Martinique, MD;  Location: [facility] CATH LAB;  Service: Cardiovascular;  Laterality: [name];  . PERCUTANEOUS CORONARY STENT INTERVENTION (PCI-S) Right 07/06/2014   Procedure: PERCUTANEOUS CORONARY STENT INTERVENTION (PCI-S);  Surgeon: Peter M Martinique, MD;  Location: [facility] CATH LAB;  Service: Cardiovascular;  Laterality: Right;  . SHOULDER ARTHROSCOPY WITH SUBACROMIAL DECOMPRESSION Left 03/05/2016   Procedure: SHOULDER ARTHROSCOPY ROTATOR CUFF REPAIR WITH SUBACROMIAL DECOMPRESSION;  Surgeon: Tania Ade, MD;  Location: [place];  Service: Orthopedics;  Laterality: Left;  SHOULDER ARTHROSCOPY ROTATOR CUFF REPAIR WITH SUBACROMIAL DECOMPRESSION  . SHOULDER CLOSED REDUCTION Left 11/29/2015   Procedure: CLOSED REDUCTION SHOULDER;  Surgeon: Tania Ade, MD;  Location: [place];  Service: Orthopedics;  Laterality: Left;  . TRANSURETHRAL RESECTION OF PROSTATE [name] 02/24/2018   Procedure: TRANSURETHRAL RESECTION OF THE PROSTATE (TURP);  Surgeon: Irine Seal, MD;  Location: [facility];  Service: Urology;  Laterality: [name];  . WRIST SURGERY Bilateral    plates and screws     Family History  Problem Relation Age of Onset  . Aneurysm Father            Social History:  reports that he has  never smoked. He has never used smokeless tobacco. He reports current alcohol use. He reports that he does not use drugs.  No Known Allergies  MEDICATIONS:  I have reviewed the patient's current medications.   ROS:                                                                                                                                       History obtained from the patient  General ROS: negative for - fatigue, fever, night sweats, weight gain or weight loss Psychological ROS: negative for - behavioral disorder, hallucinations, memory difficulties, mood swings or suicidal ideation baseline. Patient self reports that during this admission he thought he saw people that turned out not to be there.  Ophthalmic ROS: negative for - blurry vision, double vision, eye pain or loss of vision ENT ROS: negative for - epistaxis, nasal discharge, oral lesions, sore throat, tinnitus or vertigo Allergy and Immunology ROS: negative for - hives or itchy/watery eyes Hematological and Lymphatic ROS: negative for - bleeding problems, bruising or swollen lymph nodes Endocrine ROS: negative for - hair pattern changes, or temperature intolerance Respiratory ROS: negative for - reports shortness of breath and believes related to pneumothorax Cardiovascular ROS: negative for - chest pain, dyspnea on exertion, edema or irregular heartbeat baseline Gastrointestinal ROS: negative for - abdominal pain, diarrhea, hematemesis, nausea/vomiting or stool incontinence Genito-Urinary ROS: negative for - presented with urinary retention  Musculoskeletal ROS: negative for - joint swelling or muscular weakness.  Neurological ROS: as noted in HPI Dermatological ROS: negative for rash and skin lesion changes   Blood pressure 119/73, pulse (!) 102, temperature 97.9 F (36.6 C), temperature source Oral, resp.  rate 19, height 5\' 10"  (1.778 m), weight 93 kg, SpO2 90 %.   General Examination:                                                                                                       Physical Exam  HEENT-  Normocephalic, no lesions, without obvious abnormality.  Normal external eye and conjunctiva.   Cardiovascular- bedside telemetry, pulses palpable throughout   Lungs- no excessive working breathing.  Saturations within normal limits Abdomen- All 4 quadrants palpated and nontender Extremities- Warm, dry and intact Musculoskeletal-no joint tenderness, deformity or swelling Skin-warm and dry, no hyperpigmentation, vitiligo, or suspicious lesions  Neurological Examination  Mental Status: Alert, oriented, thought content appropriate.  Speech fluent without evidence of aphasia.  Able to follow 3 step commands without difficulty. Cranial Nerves: II: Visual fields grossly normal,  III,IV, VI: ptosis not present, extra-ocular motions intact bilaterally pupils equal, round, reactive to light and accommodation V,VII: smile symmetric,  facial light touch sensation normal bilaterally VIII: hearing normal bilaterally IX,X: uvula rises symmetrically XI: bilateral shoulder shrug XII: midline tongue extension Motor: Right : Upper extremity   5/5     Left:     Upper extremity   5/5 elbow flexion and extension. Minimal attempt at shoulder abduction due to longstanding shoulder pain.  Right: Lower extremity   5/5      Left: Lower extremity   5/5 Tone and bulk:normal tone throughout; no atrophy noted Sensory:  touch intact throughout, bilaterally Cerebellar: Able to perform finger to nose however tremor becomes more visible left greater than right, minimal tremor when he was at rest. Decreased rapid alternating movements, left greater than right. Cogwheeling left greater than right.    Lab Results: Basic Metabolic Panel: Recent Labs  Lab 12/03/19 1036 12/03/19 1036 12/04/19 0205 12/04/19 0205  12/05/19 0850 12/06/19 0108 12/07/19 0847  NA 141  --  142  --  136 137 140  K 3.6  --  3.8  --  3.8 3.4* 4.1  CL 104  --  105  --  102 105 107  CO2 26  --  28  --  27 24 26   GLUCOSE 121*  --  112*  --  127* 154* 143*  BUN 14  --  11  --  12 12 14   CREATININE 0.96  --  1.07  --  1.05 0.98 0.99  CALCIUM 9.7   < > 9.8   < > 9.4 9.1 9.9   < > = values in this interval not displayed.    CBC: Recent Labs  Lab 12/03/19 1036 12/04/19 0205 12/05/19 0145 12/06/19 0108 12/07/19 0847  WBC 8.6 8.3 7.8 7.5 7.0  NEUTROABS 5.7  --   --   --   --   HGB 14.6 14.0 12.7* 12.6* 13.2  HCT 43.8 42.3 38.2* 38.3* 39.4  MCV 92.0 91.6 90.7 90.5 92.3  PLT 213 198 184 182 204    Cardiac Enzymes: No results for input(s): CKTOTAL, CKMB, CKMBINDEX, TROPONINI in the last 168 hours.  Lipid Panel: No results for input(s): CHOL, TRIG, HDL, CHOLHDL, VLDL, LDLCALC in the last 168 hours.  Imaging: CT HEAD WO CONTRAST  Result Date: 12/05/2019 CLINICAL DATA:  Fall 5 days prior to admission multiple falls EXAM: CT HEAD WITHOUT CONTRAST TECHNIQUE: Contiguous axial images were obtained from the base of the skull through the vertex without intravenous contrast. COMPARISON:  Most recent CT 12/04/2019 FINDINGS: Brain: Redemonstration of the heterogeneous, hyperdense extra-axial hemorrhage seen along the right tentorial leaflet with associated mass effect on the adjacent right cerebellum and into a lesser extent pons including some partial effacement of the fourth ventricle. Size measures approximately 3 x 1.8 x 3.2 cm, which is grossly similar to comparison accounting for differences in patient positioning and slight redistribution. Additional blood layers along the right tentorium. Scattered foci of additional subarachnoid hemorrhage is present along the right cerebellar hemisphere, right parietooccipital and temporal lobes, and possibly within several of the sulci of the left occipital and parietal lobes as well. No new  large parenchymal hemorrhage is identified. No evidence of acute infarction or developing hydrocephalus. Symmetric prominence of the ventricles, cisterns and sulci compatible with parenchymal volume loss and ex vacuo dilatation. Patchy areas of white matter hypoattenuation are most compatible with chronic microvascular angiopathy. Vascular: Atherosclerotic calcification of the carotid siphons and intradural vertebral arteries. No hyperdense vessel. Skull: No calvarial fracture or suspicious osseous lesion. No scalp swelling or hematoma.  Mild soft tissue thickening along the left frontotemporal scalp. Some additional contusive changes of the left occipital scalp as well. No subjacent calvarial or visible skull base fractures. Some age indeterminate deformities of the nasal bones. Sinuses/Orbits: Paranasal sinuses and mastoid air cells are predominantly clear. Orbital structures are unremarkable aside from prior lens extractions. Other: Extensive soft tissue emphysema within the superficial and deep spaces of the included portions of the neck. IMPRESSION: 1. Redemonstration of the heterogeneous, hyperdense extra-axial hemorrhage along the right tentorial leaflet with associated mass effect on the adjacent right cerebellum and into a lesser extent pons including some partial effacement of the fourth ventricle. Size measures approximately 3 x 1.8 x 3.2 cm, which is grossly similar to comparison accounting for differences in patient positioning and slight redistribution. 2. Scattered foci of additional subarachnoid hemorrhage along the right cerebellar hemisphere, right parietooccipital and temporal lobes, and possibly within several of the sulci of the left occipital and parietal lobes as well. 3. No new large parenchymal hemorrhage is identified. No evidence of acute infarction or developing hydrocephalus. 4. Extensive soft tissue emphysema within the superficial and deep spaces of the included portions of the neck. 5.  No Calvarial or visible skull base fractures. 6. Several sites of scalp swelling/infiltration including the left temporal and occipital scalp. 7. Some age indeterminate deformities of the nasal bones. Correlate for point tenderness. Electronically Signed   By: Lovena Le M.D.   On: 12/05/2019 22:29   DG CHEST PORT 1 VIEW  Result Date: 12/07/2019 CLINICAL DATA:  Chest tube.  Pneumothorax. EXAM: PORTABLE CHEST 1 VIEW COMPARISON:  December 05, 2019. FINDINGS: Stable cardiomediastinal silhouette. Stable position of right-sided chest tube. Large amount of subcutaneous emphysema is seen over both supraclavicular regions and right lateral chest wall. No definite pneumothorax is noted, but may be obscured due to overlying subcutaneous emphysema. Lungs are otherwise clear. Left-sided pacemaker is unchanged. Bony thorax is unremarkable. IMPRESSION: Stable position of right-sided chest tube. Large amount of subcutaneous emphysema is seen over both supraclavicular regions and right lateral chest wall. No definite pneumothorax is noted, but may be obscured due to overlying subcutaneous emphysema. Electronically Signed   By: Marijo Conception M.D.   On: 12/07/2019 08:09   DG CHEST PORT 1 VIEW  Result Date: 12/06/2019 CLINICAL DATA:  Follow-up pneumothorax EXAM: PORTABLE CHEST 1 VIEW COMPARISON:  12/05/2019 FINDINGS: Pigtail catheter is again noted on the right. No definitive pneumothorax is seen. Persistent subcutaneous emphysema is seen. Cardiac shadow is stable. Defibrillator is again noted. No focal infiltrate is seen. IMPRESSION: No evidence of pneumothorax. Electronically Signed   By: Inez Catalina M.D.   On: 12/06/2019 06:52    Assessment: RYLYN RANGANATHAN is an 74 y.o. male history of CAD status post CPI, CHF, Hypertension, positive lupus anticoagulant, history of recurrent DVT, sleep apnea.  Recent history of spinal epidural injection in July at which time his coumadin was held. He presented 10/12/19 to the  hospital after a fall and was diagnosed with a subdural hematoma. Discharged with plan to hold ASA and coumadin for 10 days. Returned to hospital 10/25/19 after another fall, no acute findings at that time. 10/30/19 cleared to resume coumadin. Presented this admission on 12/03/19 with urinary retention. Diagnosed with pneumothroax and rib fracture requiring chest tube. A right subdural hematoma and subarachnoid hemorrhage identified on imaging, kcentra given. Plans to discontinue chest tube today. He has been confused during this admission, however family feels he has started to make significant improvement.  Working towards discharge to [facility].   Concern for shuffling gait, tremor and possible parkinson's. Neurology consulted. Spoke with wife and daughter and the believe he has declined over the past 2 years and has depression. They have noticed his gait worsening and tremor in his hands, which started about a year ago. His daughter does however report that his gait had significant improvement following his spinal injection when pain improved.     Impression: Hospital delirium Parkinson's features   Recommendations: -Given his hospital delirium starting sinemet at this time is not advised as it can worsen or cause confusion. If his gait is a barrier in rehab a trial of sinement 25/100 TID could be consider. Best case scenario he would be able to follow-up with outpatient neurology to be started on sinement and closely followed.   Gwenyth Bouillon DNP, FNP-C [facility] Nurse Practitioner.    12/07/2019, 10:46 AM  Plan discussed with Dr. Leonel Ramsay, he plans to see the patient today and provide further recommendations.

## 2019-12-07 NOTE — Progress Notes (Addendum)
Patients daughter wants wife to come up here for a little but she cant stay the whole day.

## 2019-12-07 NOTE — Progress Notes (Signed)
[facility] Progress Note     Subjective: CC-  No family at bedside. Awake and alert this morning, fully oriented. Getting bed bath. No complaints. States that he was supposed to go to the beach next Tuesday.  Objective: Vital signs in last 24 hours: Temp:  [97.9 F (36.6 C)-99 F (37.2 C)] 97.9 F (36.6 C) (09/16 0800) Pulse Rate:  [77-218] 102 (09/16 0800) Resp:  [11-25] 19 (09/16 0800) BP: (88-142)/(59-120) 119/73 (09/16 0800) SpO2:  [90 %-98 %] 90 % (09/16 0800) Last BM Date: 12/03/19  Intake/Output from previous day: 09/15 0701 - 09/16 0700 In: 568 [I.V.:568] Out: 2625 [Urine:2625] Intake/Output this shift: No intake/output data recorded.  PE: Gen: Alert, NAD, pleasant HEENT: Pupils equal and round.EOM's intact Card: RRR Pulm: CTAB, no W/R/R, effort normal. R chest tube in place on waterseal with minimal output. No air leak. Abd: Soft, NT/ND, +BS Neuro: no focal deficit, moving all extremities Psych: A&Ox4 Skin: no rashes noted, warm and dry   Lab Results:  Recent Labs    12/06/19 0108 12/07/19 0847  WBC 7.5 7.0  HGB 12.6* 13.2  HCT 38.3* 39.4  PLT 182 204   BMET Recent Labs    12/05/19 0850 12/06/19 0108  NA 136 137  K 3.8 3.4*  CL 102 105  CO2 27 24  GLUCOSE 127* 154*  BUN 12 12  CREATININE 1.05 0.98  CALCIUM 9.4 9.1   PT/INR Recent Labs    12/05/19 0145 12/06/19 0108  LABPROT 15.5* 16.5*  INR 1.3* 1.4*   CMP     Component Value Date/Time   NA 137 12/06/2019 0108   NA 138 11/09/2017 1618   K 3.4 (L) 12/06/2019 0108   CL 105 12/06/2019 0108   CO2 24 12/06/2019 0108   GLUCOSE 154 (H) 12/06/2019 0108   BUN 12 12/06/2019 0108   BUN 21 11/09/2017 1618   CREATININE 0.98 12/06/2019 0108   CREATININE 1.09 10/17/2015 1154   CALCIUM 9.1 12/06/2019 0108   PROT 6.8 12/03/2019 1036   PROT 7.0 09/14/2018 0839   ALBUMIN 3.4 (L) 12/03/2019 1036   ALBUMIN 4.6 09/14/2018 0839   AST 18 12/03/2019 1036   ALT 12 12/03/2019  1036   ALKPHOS 32 (L) 12/03/2019 1036   BILITOT 1.5 (H) 12/03/2019 1036   BILITOT 0.6 09/14/2018 0839   GFRNONAA >60 12/06/2019 0108   GFRAA >60 12/06/2019 0108   Lipase     Component Value Date/Time   LIPASE 40 10/25/2017 2349       Studies/Results: CT HEAD WO CONTRAST  Result Date: 12/05/2019 CLINICAL DATA:  Fall 5 days prior to admission multiple falls EXAM: CT HEAD WITHOUT CONTRAST TECHNIQUE: Contiguous axial images were obtained from the base of the skull through the vertex without intravenous contrast. COMPARISON:  Most recent CT 12/04/2019 FINDINGS: Brain: Redemonstration of the heterogeneous, hyperdense extra-axial hemorrhage seen along the right tentorial leaflet with associated mass effect on the adjacent right cerebellum and into a lesser extent pons including some partial effacement of the fourth ventricle. Size measures approximately 3 x 1.8 x 3.2 cm, which is grossly similar to comparison accounting for differences in patient positioning and slight redistribution. Additional blood layers along the right tentorium. Scattered foci of additional subarachnoid hemorrhage is present along the right cerebellar hemisphere, right parietooccipital and temporal lobes, and possibly within several of the sulci of the left occipital and parietal lobes as well. No new large parenchymal hemorrhage is identified. No evidence of acute infarction or developing  hydrocephalus. Symmetric prominence of the ventricles, cisterns and sulci compatible with parenchymal volume loss and ex vacuo dilatation. Patchy areas of white matter hypoattenuation are most compatible with chronic microvascular angiopathy. Vascular: Atherosclerotic calcification of the carotid siphons and intradural vertebral arteries. No hyperdense vessel. Skull: No calvarial fracture or suspicious osseous lesion. No scalp swelling or hematoma. Mild soft tissue thickening along the left frontotemporal scalp. Some additional contusive changes  of the left occipital scalp as well. No subjacent calvarial or visible skull base fractures. Some age indeterminate deformities of the nasal bones. Sinuses/Orbits: Paranasal sinuses and mastoid air cells are predominantly clear. Orbital structures are unremarkable aside from prior lens extractions. Other: Extensive soft tissue emphysema within the superficial and deep spaces of the included portions of the neck. IMPRESSION: 1. Redemonstration of the heterogeneous, hyperdense extra-axial hemorrhage along the right tentorial leaflet with associated mass effect on the adjacent right cerebellum and into a lesser extent pons including some partial effacement of the fourth ventricle. Size measures approximately 3 x 1.8 x 3.2 cm, which is grossly similar to comparison accounting for differences in patient positioning and slight redistribution. 2. Scattered foci of additional subarachnoid hemorrhage along the right cerebellar hemisphere, right parietooccipital and temporal lobes, and possibly within several of the sulci of the left occipital and parietal lobes as well. 3. No new large parenchymal hemorrhage is identified. No evidence of acute infarction or developing hydrocephalus. 4. Extensive soft tissue emphysema within the superficial and deep spaces of the included portions of the neck. 5. No Calvarial or visible skull base fractures. 6. Several sites of scalp swelling/infiltration including the left temporal and occipital scalp. 7. Some age indeterminate deformities of the nasal bones. Correlate for point tenderness. Electronically Signed   By: Lovena Le M.D.   On: 12/05/2019 22:29   DG CHEST PORT 1 VIEW  Result Date: 12/07/2019 CLINICAL DATA:  Chest tube.  Pneumothorax. EXAM: PORTABLE CHEST 1 VIEW COMPARISON:  December 05, 2019. FINDINGS: Stable cardiomediastinal silhouette. Stable position of right-sided chest tube. Large amount of subcutaneous emphysema is seen over both supraclavicular regions and right  lateral chest wall. No definite pneumothorax is noted, but may be obscured due to overlying subcutaneous emphysema. Lungs are otherwise clear. Left-sided pacemaker is unchanged. Bony thorax is unremarkable. IMPRESSION: Stable position of right-sided chest tube. Large amount of subcutaneous emphysema is seen over both supraclavicular regions and right lateral chest wall. No definite pneumothorax is noted, but may be obscured due to overlying subcutaneous emphysema. Electronically Signed   By: Marijo Conception M.D.   On: 12/07/2019 08:09   DG CHEST PORT 1 VIEW  Result Date: 12/06/2019 CLINICAL DATA:  Follow-up pneumothorax EXAM: PORTABLE CHEST 1 VIEW COMPARISON:  12/05/2019 FINDINGS: Pigtail catheter is again noted on the right. No definitive pneumothorax is seen. Persistent subcutaneous emphysema is seen. Cardiac shadow is stable. Defibrillator is again noted. No focal infiltrate is seen. IMPRESSION: No evidence of pneumothorax. Electronically Signed   By: Inez Catalina M.D.   On: 12/06/2019 06:52    Anti-infectives: Anti-infectives (From admission, onward)   None       Assessment/Plan 74Ms/pmechanicalfall SDH/SAH- Per Dr. Christella Noa of [place]. Coumadin reversed w/ K Centra (9/12). INR pendingthis AM. Repeat CTH 9/13with stable SDH, slightly increased SAH. Repeat [facility] 9/14 PM w/ hemorrhage of the right tentorial leaflet w/ mass effect that is stable + scattered foci of SAH.  Keppra. TBI therapies.NSGY wrote they have consulted Neurology for concerns for possible parkinson's disease. Will follow up on Neurology  recs.  Large right side PTX with subcutaneous emphysema- S/p CT. CXR this AM without PTX. d/c chest tube today and repeat film this afternoon Right sided 2-4 rib fractures- Multimodal pain control. Pulm toilet. PT/OT HTN- BP improved but still intermittently on the low side. Home meds held. Cards has seen and started on low dose Coreg and Lasix. Monitor BP today and restart home meds when  BP improved Hx ofCHF-Echo 9/13 w/EF30-35%, severe akinesis, aneurysmal apex.There was an inferoapical filling defect felt to less to be a thrombus. Cards consulted and felt this is likely artifact/shadowing.Conttelemetry. No evidence of fluid overload at this time. Holding Lasixgiven soft BP.Cont gentle fluids (11ml/hr). Restart Entresto and Propanolol when BP improved. PVC's - PVC's noted on tele. Will await further recs from cards.   OSA- continue home CPAP Hx CAD(prior LHC 07/2019) - cards following H/orecurrentDVT(+ Lupus anticoagulant)- Coumadin reversed w/ K Centra (9/12). INR 1.4this AM. Holding Coumadin. Daily INR and CBC. NSGY recommending no anticoagulation in the future with frequent falls (3 falls in the last 3 weeks).Vascular and hematology has seen the patient. No indication for IVC filter at this time.  Infrarenal AAA -Per Vascular. They recommend follow up US in 1 year Multinodular Thyroid - Will need outpatient thyroid US Urinary retention- failed voiding trial 9/15, foley replaced, continue Urecholine.  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 NT on exam.  Hypokalemia - BMP pending ABL Anemia - CBC Pending FEN -Reg, gentle IVF. Home PPI. Bowel regimen. VTE -SCDs, hold chemical prophylaxis ID -None. UA and BCx ordered for confusion 9/14 PM. Will follow Foley - Voiding trial today.  Dispo - Labs pending. TBI therapies. PT currently recommending CIR.Lives at home with his wife.Neurology consult pending.   LOS: 4 days    [facility] 12/07/2019, 9:55 AM Please see Amion for pager number during day hours 7:00am-4:30pm

## 2019-12-07 NOTE — Progress Notes (Signed)
Physical Therapy Treatment Patient Details Name: Kevin Hart MRN: [id number] DOB: 18-Mar-1946 Today's Date: 12/07/2019    History of Present Illness 74 yo male s/p fall with R cerebellum SDH SAH R side 2-4 rib fxs and Large R PTX with subcutaneous emphysema Pt iwth x3 falls in 3 weeks.  PMH OSA CAD DVT HTN arthritis bil cataracts CHF lupus MI 2015 s/p fall with SAH scoliosis    PT Comments    Pt tolerates treatment well, demonstrating a strong R lateral lean this session. Pt with impaired visual-spatial awareness, reporting he feels as if he is falling to L side, resulting in strong lean to R. Pt continues to requires significant physical assistance for all functional mobility and is generally stiff in most joints at this time. Pt will benefit from continued acute PT POC to improve mobility quality and to reduce falls risk. Pt will benefit from use of full length mirror during session to aide in promoting awareness of balance deviations during functional mobility and to aide in reorienting to midline. PT continues to recommend CIR placement at this time.  Follow Up Recommendations  CIR;Supervision/Assistance - 24 hour     Equipment Recommendations  3in1 (PT)    Recommendations for Other Services       Precautions / Restrictions Precautions Precautions: Fall Precaution Comments: strong R lateral lean Restrictions Weight Bearing Restrictions: No    Mobility  Bed Mobility Overal bed mobility: Needs Assistance Bed Mobility: Supine to Sit;Sit to Supine     Supine to sit: Max assist;HOB elevated Sit to supine: Max assist      Transfers Overall transfer level: Needs assistance Equipment used: 1 person hand held assist Transfers: Sit to/from Stand Sit to Stand: Max assist         General transfer comment: pt performs sit to stand x3 with BUE support of PT and R knee block, pt with strong R lateral lean, actively pushing against PT attempts to facilitate correction. Pt's  daughter assists by providing visual feedback from phone camera however pt is unable to correct to upright position in standing  Ambulation/Gait                 Stairs             Wheelchair Mobility    Modified Rankin (Stroke Patients Only) Modified Rankin (Stroke Patients Only) Pre-Morbid Rankin Score: No symptoms Modified Rankin: Severe disability     Balance Overall balance assessment: Needs assistance Sitting-balance support: Bilateral upper extremity supported;Feet supported Sitting balance-Leahy Scale: Zero Sitting balance - Comments: maxA with strong R lateral lean, pt resisting against PT attempts to correct. Pt reports he feels as if he is falling to the left side. PT attempts to use visual cues of phone camera to assist but this is minimally effective. Pt is more effective in correcting balance with tactile and verbal cues and after reassurance was provided that he would not fall to left side Postural control: Right lateral lean Standing balance support: Bilateral upper extremity supported Standing balance-Leahy Scale: Zero Standing balance comment: max-totalA, R lateral lean                            Cognition Arousal/Alertness: Awake/alert Behavior During Therapy: Flat affect Overall Cognitive Status: Impaired/Different from baseline Area of Impairment: Memory;Following commands;Safety/judgement;Awareness;Problem solving                     Memory: Decreased recall of  precautions Following Commands: Follows one step commands with increased time Safety/Judgement: Decreased awareness of safety;Decreased awareness of deficits Awareness: Emergent Problem Solving: Slow processing;Requires verbal cues        Exercises Other Exercises Other Exercises: attempts at facilitating cervical extension and rotation to left side x5 repetitions of AAROM. Pt is very stiff with limited cervical extension available at this time.    General  Comments General comments (skin integrity, edema, etc.): SpO2 intermittently drops during session, pt denies symptoms and RR does not noticeable increased. 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 Pain Descriptors / Indicators: Aching Pain Intervention(s): Monitored during session    Home Living                      Prior Function            PT Goals (current goals can now be found in the care plan section) Acute Rehab PT Goals Patient Stated Goal: to inmprove mobility and go to rehab Progress towards PT goals: Progressing toward goals    Frequency    Min 4X/week      PT Plan Current plan remains appropriate    Co-evaluation              AM-PAC PT "6 Clicks" Mobility   Outcome Measure  Help needed turning from your back to your side while in a flat bed without using bedrails?: Total Help needed moving from lying on your back to sitting on the side of a flat bed without using bedrails?: Total Help needed moving to and from a bed to a chair (including a wheelchair)?: Total Help needed standing up from a chair using your arms (e.g., wheelchair or bedside chair)?: Total Help needed to walk in hospital room?: Total Help needed climbing 3-5 steps with a railing? : Total 6 Click Score: 6    End of Session   Activity Tolerance: Patient tolerated treatment well Patient left: in bed;with call bell/phone within reach;with bed alarm set;with family/visitor present;with nursing/sitter in room Nurse Communication: Mobility status PT Visit Diagnosis: Unsteadiness on feet (R26.81);Other abnormalities of gait and mobility (R26.89);Muscle weakness (generalized) (M62.81);Difficulty in walking, not elsewhere classified (R26.2)     Time: 8115-7262 PT Time Calculation (min) (ACUTE ONLY): 30 min  Charges:  $Therapeutic  Activity: 23-37 mins                     Zenaida Niece, PT, DPT Acute Rehabilitation Pager: [phone]    Zenaida Niece 12/07/2019, 5:41 PM

## 2019-12-07 NOTE — Progress Notes (Signed)
Patient ID: Kevin Hart, male   DOB: 1946-03-21, 74 y.o.   MRN: [id number] BP 130/79 (BP Location: Right Arm)   Pulse (!) 103   Temp 97.7 F (36.5 C) (Oral)   Resp 20   Ht 5\' 10"  (1.778 m)   Wt 93 kg   SpO2 93%   BMI 29.41 kg/m  Alert, follows commands No change neurologically Appreciate neurology help Ok for CIR or rehab from [facility]

## 2019-12-08 LAB — CBC
HCT: 40.2 % (ref 39.0–52.0)
Hemoglobin: 13.5 g/dL (ref 13.0–17.0)
MCH: 31 pg (ref 26.0–34.0)
MCHC: 33.6 g/dL (ref 30.0–36.0)
MCV: 92.2 fL (ref 80.0–100.0)
Platelets: 208 10*3/uL (ref 150–400)
RBC: 4.36 MIL/uL (ref 4.22–5.81)
RDW: 14 % (ref 11.5–15.5)
WBC: 7.4 10*3/uL (ref 4.0–10.5)
nRBC: 0 % (ref 0.0–0.2)

## 2019-12-08 LAB — BASIC METABOLIC PANEL
Anion gap: 4 — ABNORMAL LOW (ref 5–15)
BUN: 14 mg/dL (ref 8–23)
CO2: 24 mmol/L (ref 22–32)
Calcium: 9.5 mg/dL (ref 8.9–10.3)
Chloride: 108 mmol/L (ref 98–111)
Creatinine, Ser: 0.93 mg/dL (ref 0.61–1.24)
GFR calc Af Amer: >60 mL/min (ref >60)
GFR calc non Af Amer: >60 mL/min (ref >60)
Glucose, Bld: 120 mg/dL — ABNORMAL HIGH (ref 70–99)
Potassium: 3.9 mmol/L (ref 3.5–5.1)
Sodium: 136 mmol/L (ref 135–145)

## 2019-12-08 NOTE — Progress Notes (Signed)
Patient ID: Kevin Hart, male   DOB: September 11, 1945, 74 y.o.   MRN: [id number] Alert and oriented x 4, speech is clear Moves all extremities Flat affect No neurological changes No new recommendations

## 2019-12-08 NOTE — Progress Notes (Signed)
Inpatient Rehabilitation Admissions Coordinator  I met at bedside with patient's daughter. I will follow up on Monday with his therapy progress and begin insurance approval Monday for possible admit pending insurance approval and bed availability for Tuesday.  Danne Baxter, RN, MSN Rehab Admissions Coordinator [phone] 12/08/2019 12:31 PM

## 2019-12-08 NOTE — Progress Notes (Signed)
[facility] Progress Note     Subjective: CC-  Awake this morning but mildly confused. Reports headache but no other complaints. Afebrile, vitals stable.  Objective: Vital signs in last 24 hours: Temp:  [97.5 F (36.4 C)-99.3 F (37.4 C)] 98.3 F (36.8 C) (09/17 0752) Pulse Rate:  [86-103] 100 (09/17 0752) Resp:  [17-20] 19 (09/17 0752) BP: (119-144)/(73-89) 144/89 (09/17 0752) SpO2:  [90 %-94 %] 90 % (09/17 0752) Last BM Date: 12/03/19  Intake/Output from previous day: 09/16 0701 - 09/17 0700 In: 240 [P.O.:240] Out: 1925 [Urine:1925] Intake/Output this shift: No intake/output data recorded.  PE: Gen: Alert, NAD, pleasant HEENT: Pupils equal and round. Card: RRR Pulm: CTAB, no W/R/R, effort normal. Abd: Soft, nondistended, nontender to palpation Neuro: no focal deficit, moving all extremities spontaneously Psych: A&Ox4 Skin: no rashes noted, warm and dry   Lab Results:  Recent Labs    12/07/19 0847 12/08/19 0519  WBC 7.0 7.4  HGB 13.2 13.5  HCT 39.4 40.2  PLT 204 208   BMET Recent Labs    12/07/19 0847 12/08/19 0519  NA 140 136  K 4.1 3.9  CL 107 108  CO2 26 24  GLUCOSE 143* 120*  BUN 14 14  CREATININE 0.99 0.93  CALCIUM 9.9 9.5   PT/INR Recent Labs    12/06/19 0108 12/07/19 0847  LABPROT 16.5* 15.5*  INR 1.4* 1.3*   CMP     Component Value Date/Time   NA 136 12/08/2019 0519   NA 138 11/09/2017 1618   K 3.9 12/08/2019 0519   CL 108 12/08/2019 0519   CO2 24 12/08/2019 0519   GLUCOSE 120 (H) 12/08/2019 0519   BUN 14 12/08/2019 0519   BUN 21 11/09/2017 1618   CREATININE 0.93 12/08/2019 0519   CREATININE 1.09 10/17/2015 1154   CALCIUM 9.5 12/08/2019 0519   PROT 6.8 12/03/2019 1036   PROT 7.0 09/14/2018 0839   ALBUMIN 3.4 (L) 12/03/2019 1036   ALBUMIN 4.6 09/14/2018 0839   AST 18 12/03/2019 1036   ALT 12 12/03/2019 1036   ALKPHOS 32 (L) 12/03/2019 1036   BILITOT 1.5 (H) 12/03/2019 1036   BILITOT 0.6 09/14/2018 0839    GFRNONAA >60 12/08/2019 0519   GFRAA >60 12/08/2019 0519   Lipase     Component Value Date/Time   LIPASE 40 10/25/2017 2349       Studies/Results: DG CHEST PORT 1 VIEW  Result Date: 12/07/2019 CLINICAL DATA:  Right chest tube removal EXAM: PORTABLE CHEST 1 VIEW COMPARISON:  Chest x-ray 12/06/2019, CT chest 12/03/2019 FINDINGS: Interval removal of a right chest tube. The heart size and mediastinal contours are unchanged. Coronary artery stent again noted. Left chest wall cardiac defibrillator in similar position. Persistent trace pneumomediastinum. Persistent mild elevation of the left hemidiaphragm. No focal consolidation. No pulmonary edema. No pleural effusion. No pneumothorax. No acute osseous abnormality. Persistent extensive subcutaneus soft tissue edema along the right chest wall and bilateral neck. IMPRESSION: 1. Interval removal of a right chest tube. No pneumothorax bilaterally. 2. Persistent trace pneumomediastinum. 3. Persistent extensive subcutaneus soft tissue edema along the right chest wall and bilateral neck. Electronically Signed   By: Iven Finn M.D.   On: 12/07/2019 15:35   DG CHEST PORT 1 VIEW  Result Date: 12/07/2019 CLINICAL DATA:  Chest tube.  Pneumothorax. EXAM: PORTABLE CHEST 1 VIEW COMPARISON:  December 05, 2019. FINDINGS: Stable cardiomediastinal silhouette. Stable position of right-sided chest tube. Large amount of subcutaneous emphysema is seen over both supraclavicular  regions and right lateral chest wall. No definite pneumothorax is noted, but may be obscured due to overlying subcutaneous emphysema. Lungs are otherwise clear. Left-sided pacemaker is unchanged. Bony thorax is unremarkable. IMPRESSION: Stable position of right-sided chest tube. Large amount of subcutaneous emphysema is seen over both supraclavicular regions and right lateral chest wall. No definite pneumothorax is noted, but may be obscured due to overlying subcutaneous emphysema.  Electronically Signed   By: Marijo Conception M.D.   On: 12/07/2019 08:09    Anti-infectives: Anti-infectives (From admission, onward)   None       Assessment/Plan 74Ms/pmechanicalfall SDH/SAH- Per Dr. Christella Noa of [place]. Coumadin reversed w/ K Centra (9/12). INR 1.3 yesterday. Repeat CTH 9/13with stable SDH, slightly increased SAH. Repeat [facility] 9/14 PM w/ hemorrhage of the right tentorial leaflet w/ mass effect that is stable + scattered foci of SAH.  Keppra. 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 Appreciate recommendations, plan for outpatient neurology follow up. Large right side PTX with subcutaneous emphysema- Chest tube removed yesterday, follow up CXR with no residual pneumothorax. Right sided 2-4 rib fractures- Multimodal pain control. Pulm toilet. PT/OT HTN- BP normal last 24 hours, no hypotension. On low dose coreg and PRN hydralazine. Hx ofCHF-Echo 9/13 w/EF30-35%, severe akinesis, aneurysmal apex.There was an inferoapical filling defect felt to less to be a thrombus. Cards consulted and felt this is likely artifact/shadowing.Conttelemetry. No evidence of fluid overload at this time. Holding Lasixgiven soft BP.Cont gentle fluids (33ml/hr). Restart Entresto and Propanolol when BP is higher, currently normotensive. PVC's - PVC's noted on tele. Will await further recs from cards.   OSA- continue home CPAP Hx CAD(prior LHC 07/2019) - cards following H/orecurrentDVT(+ Lupus anticoagulant)- Coumadin reversed w/ K Centra (9/12). INR 1.4this AM. Holding Coumadin. Daily INR and CBC. NSGY recommending no anticoagulation in the future with frequent falls (3 falls in the last 3 weeks).Vascular and hematology has seen the patient. No indication for IVC filter at this time.  Infrarenal AAA -Per Vascular. They recommend follow up US in 1 year Multinodular Thyroid - Will need outpatient thyroid  US Urinary retention- failed voiding trial 9/15, foley replaced, continue Urecholine.  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 NT on exam.  Hypokalemia - Resolved, potassium 3.9 today ABL Anemia - Resolved hgb stable at 13 FEN -Reg, gentle IVF. Home PPI. Bowel regimen. VTE -SCDs, hold chemical prophylaxis ID -None. UA and BCx ordered for confusion 9/14 PM - both negative. Foley - Failed voiding trial yesterday, foley replaced. Keep in place today. Dispo - Labs pending. TBI therapies. PT currently recommending CIR, working on placement.   LOS: 5 days    Dwan Bolt, [facility] 12/08/2019, 9:05 AM Please see Amion for pager number during day hours 7:00am-4:30pm

## 2019-12-08 NOTE — Progress Notes (Signed)
  Speech Language Pathology Treatment: Cognitive-Linquistic  Patient Details Name: Kevin Hart MRN: [id number] DOB: Mar 17, 1946 Today's Date: 12/08/2019 Time: 1635-1700 SLP Time Calculation (min) (ACUTE ONLY): 25 min  Assessment / Plan / Recommendation Clinical Impression  Patient seen to address cognitive and speech, voice goals with daughter present. Daughter states that patient has been more alert and with improved cognitive functioning in morning but she has noticed he seems to wane in the afternoon as he seems to get out of sleep/wake cycles. Patient was able to attend but was fatigued and would intermittently start to close eyes. He participated in trial of pursed lip, controlled breathing, diaphragmatic breathing exercise with daughter present and observing. Patient also seen for  Cognitive-linguistic goals and we completed basic level verbal tasks of divergent naming and recall of recent events, as well as topic maintenance during semi structured conversation about patient's past work. Patient required moderate frequency of verbal cues to redirect when tangential or off-topic as well as to redirect when he appeared to have completely lost focus on question he was responding to. Speech intelligibility was approximately 85% accurate overall but with decreased vocal intensity it would decline. Daughter reported that MD's are thinking patient might have Parkinson's but they are focusing on treatment of current deficits at this time.     HPI HPI: 74 yo male s/p fall with R cerebellum SDH SAH R side 2-4 rib fxs and Large R PTX with subcutaneous emphysema Pt with x3 falls in 3 weeks.  PMH OSA CAD DVT HTN arthritis bil catar       SLP Plan  Continue with current plan of care       Recommendations   [name]                Follow up Recommendations: Inpatient Rehab SLP Visit Diagnosis: Cognitive communication deficit (R41.841);Dysarthria and anarthria (R47.1) Plan: Continue with current  plan of care       [place], [place], CCC-SLP 12/08/19 5:44 PM

## 2019-12-08 NOTE — Progress Notes (Signed)
Patient refused CPAP for HS at this time. Patient aware to call for Respiratory if he changes his mind.

## 2019-12-08 NOTE — Progress Notes (Signed)
Occupational Therapy Treatment Patient Details Name: Kevin Hart MRN: [id number] DOB: 1945-08-24 Today's Date: 12/08/2019    History of present illness 74 yo male s/p fall with R cerebellum SDH SAH R side 2-4 rib fxs and Large R PTX with subcutaneous emphysema Pt iwth x3 falls in 3 weeks.  PMH OSA CAD DVT HTN arthritis bil cataracts CHF lupus MI 2015 s/p fall with SAH scoliosis   OT comments  Pt progressing towards OT goals this session, noticeable difference in postural stability (dramatic improvement in lateral lean this session). Pt able to engage in grooming tasks, BSC transfer (Pt originally up in recliner) multiple sit<>stand for peri care (max A +2) and then return supine. Pt working very hard on trunk flexion extension pulling back forward while sitting on BSC. Special care taken to support head at end of session with towel rolls that will gently and naturally work on cervical extension HOB elevated. OT will continue to follow acutely, Current POC remains appropriate and CIR is essential to maximize safety and independence in ADL and functional transfers.   Follow Up Recommendations  CIR    Equipment Recommendations  3 in 1 bedside commode    Recommendations for Other Services      Precautions / Restrictions Precautions Precautions: Fall       Mobility Bed Mobility Overal bed mobility: Needs Assistance Bed Mobility: Sit to Supine       Sit to supine: Max assist;+2 for physical assistance;+2 for safety/equipment   General bed mobility comments: assist for BLE back into bed and assist with lowering trunk alongside multimodal cues for sequencing  Transfers Overall transfer level: Needs assistance Equipment used: 1 person hand held assist;2 person hand held assist Transfers: Sit to/from [facility] Sit to Stand: Max assist;+2 safety/equipment (from [facility], cues to push up from arm rests) Stand pivot transfers: Max assist;+2 physical assistance;+2  safety/equipment (RLE required assist to progress, max A +2 for boost)       General transfer comment: heavy mnax A +2 for SPT to the right recliner>BSC>Bed    Balance Overall balance assessment: Needs assistance Sitting-balance support: Bilateral upper extremity supported;Feet supported Sitting balance-Leahy Scale: Poor Sitting balance - Comments: able to pull weight forward for brief moments of time for pillow placement (requires assist) continues to require support in seated position   Standing balance support: Bilateral upper extremity supported Standing balance-Leahy Scale: Poor Standing balance comment: dependent on external support for balance                           ADL either performed or assessed with clinical judgement   ADL Overall ADL's : Needs assistance/impaired     Grooming: Wash/dry face;Moderate assistance;Sitting Grooming Details (indicate cue type and reason): Pt will engage with grooming tasks with max encouragement from therapists                 Toilet Transfer: +2 for physical assistance;Maximal assistance;Stand-pivot;BSC Toilet Transfer Details (indicate cue type and reason): assist for moving RLE Toileting- Clothing Manipulation and Hygiene: Maximal assistance;+2 for physical assistance;+2 for safety/equipment;Sit to/from stand Toileting - Clothing Manipulation Details (indicate cue type and reason): total A for rear peri care today     Functional mobility during ADLs: +2 for physical assistance;Maximal assistance General ADL Comments: improved posture and head alignment this session     Vision       Perception     Praxis  Cognition Arousal/Alertness: Awake/alert Behavior During Therapy: Flat affect Overall Cognitive Status: Impaired/Different from baseline Area of Impairment: Memory;Following commands;Safety/judgement;Awareness;Problem solving                     Memory: Decreased recall of  precautions Following Commands: Follows one step commands with increased time Safety/Judgement: Decreased awareness of safety;Decreased awareness of deficits Awareness: Emergent Problem Solving: Slow processing;Requires verbal cues General Comments: Pt vocalizes fear of falling frequently throughout session        Exercises Exercises: Other exercises Other Exercises Other Exercises: left lateral cervical rotation   Shoulder Instructions       General Comments positioned head at the session with towel roll behind neck and more supine to let gravity do gentle cervical extension    Pertinent Vitals/ Pain       Pain Assessment: Faces Faces Pain Scale: Hurts little more Pain Location: LUE- daughter reports chronic biceps tendon tear Pain Descriptors / Indicators: Aching Pain Intervention(s): Monitored during session;Repositioned  Home Living                                          Prior Functioning/Environment              Frequency  Min 2X/week        Progress Toward Goals  OT Goals(current goals can now be found in the care plan section)  Progress towards OT goals: Progressing toward goals  Acute Rehab OT Goals Patient Stated Goal: to inmprove mobility and go to rehab OT Goal Formulation: With patient/family Time For Goal Achievement: 12/18/19 Potential to Achieve Goals: Good  Plan Discharge plan remains appropriate;Frequency remains appropriate    Co-evaluation    PT/OT/SLP Co-Evaluation/Treatment: Yes Reason for Co-Treatment: For patient/therapist safety;To address functional/ADL transfers;Necessary to address cognition/behavior during functional activity PT goals addressed during session: Mobility/safety with mobility;Balance;Strengthening/ROM OT goals addressed during session: ADL's and self-care;Proper use of Adaptive equipment and DME;Strengthening/ROM      AM-PAC OT "6 Clicks" Daily Activity     Outcome Measure   Help from  another person eating meals?: A Little Help from another person taking care of personal grooming?: A Little Help from another person toileting, which includes using toliet, bedpan, or urinal?: A Lot Help from another person bathing (including washing, rinsing, drying)?: A Lot Help from another person to put on and taking off regular upper body clothing?: A Lot Help from another person to put on and taking off regular lower body clothing?: A Lot 6 Click Score: 14    End of Session Equipment Utilized During Treatment: Gait belt  OT Visit Diagnosis: Unsteadiness on feet (R26.81);Muscle weakness (generalized) (M62.81);Pain Pain - Right/Left: Right Pain - part of body: Shoulder (chronic)   Activity Tolerance Patient tolerated treatment well   Patient Left in bed;with call bell/phone within reach;with bed alarm set   Nurse Communication Mobility status;Precautions (bowel movement)        Time: 1610-9604 OT Time Calculation (min): 34 min  Charges: OT General Charges $OT Visit: 1 Visit OT Treatments $Self Care/Home Management : 8-22 mins  Jesse Sans OTR/L Acute Rehabilitation Services Pager: [phone] Office: [place] 12/08/2019, 1:32 PM

## 2019-12-08 NOTE — Progress Notes (Signed)
Physical Therapy Treatment Patient Details Name: Kevin Hart MRN: [id number] DOB: 1945/11/25 Today's Date: 12/08/2019   Assessment: Pt tolerates treatment well with improved static sitting balance on recliner and bedside commode with less prominent R lateral lean. In standing pt does continue to demonstrate strong R lateral lean, with continued reports of falling to the left. Pt requires significant physical assistance to stand and transfer at this time and remains at a very high falls risk. Pt will benefit from continued acute PT POC to reduce falls risk and caregiver burden. PT continues to recommend CIR placement as the pt demonstrates the potential to make significant functional gains with continued high intensity inpatient PT services.    12/08/19 1106  PT Visit Information  Last PT Received On 12/08/19  Assistance Needed +2  PT/OT/SLP Co-Evaluation/Treatment Yes  Reason for Co-Treatment Complexity of the patient's impairments (multi-system involvement);Necessary to address cognition/behavior during functional activity;For patient/therapist safety;To address functional/ADL transfers  PT goals addressed during session Mobility/safety with mobility;Balance;Strengthening/ROM  History of Present Illness 74 yo male s/p fall with R cerebellum SDH SAH R side 2-4 rib fxs and Large R PTX with subcutaneous emphysema Pt iwth x3 falls in 3 weeks.  PMH OSA CAD DVT HTN arthritis bil cataracts CHF lupus MI 2015 s/p fall with SAH scoliosis  Subjective Data  Subjective pt agreeable to PT session, more talkative today  Patient Stated Goal to improve mobility and go to rehab  Precautions  Precautions Fall  Precaution Comments R lateral lean in standing  Restrictions  Weight Bearing Restrictions No  Pain Assessment  Pain Assessment Faces  Faces Pain Scale 4  Pain Location LUE  Pain Descriptors / Indicators Aching  Pain Intervention(s) Monitored during session  Cognition  Arousal/Alertness  Awake/alert  Behavior During Therapy Flat affect  Overall Cognitive Status Impaired/Different from baseline  Area of Impairment Attention;Memory;Following commands;Safety/judgement;Awareness;Problem solving  Current Attention Level Selective  Memory Decreased recall of precautions;Decreased short-term memory  Following Commands Follows one step commands with increased time  Safety/Judgement Decreased awareness of safety;Decreased awareness of deficits  Awareness Emergent  Problem Solving Slow processing;Requires verbal cues;Difficulty sequencing  Bed Mobility  Overal bed mobility Needs Assistance  Bed Mobility Sit to Supine  Sit to supine Max assist;+2 for physical assistance  Transfers  Overall transfer level Needs assistance  Equipment used 1 person hand held assist;2 person hand held assist  Transfers Sit to/from [employer] Transfers  Sit to Stand Max assist;+2 physical assistance  Stand pivot transfers Max assist;+2 physical assistance  General transfer comment PT/OT provide BUE support as well as knee blocks for standing, pt requires facilitation of movement of LLE during transfer. During standing at [facility] pt with strong R lateral lean, with limited ability to self correct and requiring significant physical assistance to remain upright  Modified Rankin (Stroke Patients Only)  Pre-Morbid Rankin Score 0  Modified Rankin 5  Balance  Overall balance assessment Needs assistance  Sitting-balance support Bilateral upper extremity supported  Sitting balance-Leahy Scale Poor  Sitting balance - Comments reliant on BUE support, close supervision with BUE support of bedside commode railings  Postural control Right lateral lean  Standing balance support Bilateral upper extremity supported  Standing balance-Leahy Scale Zero  Standing balance comment maxA x2 due to R lateral lean  General Comments  General comments (skin integrity, edema, etc.) towel roll applied behind neck to promote  cervical extension  Exercises  Exercises Other exercises  Other Exercises  Other Exercises left lateral cervical flexion and cervical  extension x5 reps  PT - End of Session  Equipment Utilized During Treatment Gait belt  Activity Tolerance Patient tolerated treatment well  Patient left in bed;with call bell/phone within reach;with bed alarm set  Nurse Communication Mobility status   PT - Assessment/Plan  PT Plan Current plan remains appropriate  PT Visit Diagnosis Unsteadiness on feet (R26.81);Other abnormalities of gait and mobility (R26.89);Muscle weakness (generalized) (M62.81);Difficulty in walking, not elsewhere classified (R26.2)  PT Frequency (ACUTE ONLY) Min 4X/week  Follow Up Recommendations CIR;Supervision/Assistance - 24 hour  PT equipment Wheelchair (measurements PT);Wheelchair cushion (measurements PT);Hospital bed (mechanical lift, all if home today)  AM-PAC PT "6 Clicks" Mobility Outcome Measure (Version 2)  Help needed turning from your back to your side while in a flat bed without using bedrails? 1  Help needed moving from lying on your back to sitting on the side of a flat bed without using bedrails? 1  Help needed moving to and from a bed to a chair (including a wheelchair)? 1  Help needed standing up from a chair using your arms (e.g., wheelchair or bedside chair)? 1  Help needed to walk in hospital room? 1  Help needed climbing 3-5 steps with a railing?  1  6 Click Score 6  Consider Recommendation of Discharge To: CIR/SNF/LTACH  PT Goal Progression  Progress towards PT goals Progressing toward goals  PT Time Calculation  PT Start Time (ACUTE ONLY) 1037  PT Stop Time (ACUTE ONLY) 1106  PT Time Calculation (min) (ACUTE ONLY) 29 min  PT General Charges  $$ ACUTE PT VISIT 1 Visit  PT Treatments  $Therapeutic Activity 8-22 mins                     Zenaida Niece, PT, DPT Acute Rehabilitation Pager: [phone]    Zenaida Niece 12/08/2019, 3:56 PM

## 2019-12-09 LAB — BASIC METABOLIC PANEL
Anion gap: 7 (ref 5–15)
BUN: 15 mg/dL (ref 8–23)
CO2: 24 mmol/L (ref 22–32)
Calcium: 9.8 mg/dL (ref 8.9–10.3)
Chloride: 108 mmol/L (ref 98–111)
Creatinine, Ser: 0.91 mg/dL (ref 0.61–1.24)
GFR calc Af Amer: >60 mL/min (ref >60)
GFR calc non Af Amer: >60 mL/min (ref >60)
Glucose, Bld: 117 mg/dL — ABNORMAL HIGH (ref 70–99)
Potassium: 3.8 mmol/L (ref 3.5–5.1)
Sodium: 139 mmol/L (ref 135–145)

## 2019-12-09 LAB — CBC
HCT: 39.2 % (ref 39.0–52.0)
Hemoglobin: 12.7 g/dL — ABNORMAL LOW (ref 13.0–17.0)
MCH: 29.9 pg (ref 26.0–34.0)
MCHC: 32.4 g/dL (ref 30.0–36.0)
MCV: 92.2 fL (ref 80.0–100.0)
Platelets: 207 10*3/uL (ref 150–400)
RBC: 4.25 MIL/uL (ref 4.22–5.81)
RDW: 13.9 % (ref 11.5–15.5)
WBC: 7.3 10*3/uL (ref 4.0–10.5)
nRBC: 0 % (ref 0.0–0.2)

## 2019-12-09 NOTE — Plan of Care (Signed)

## 2019-12-09 NOTE — Progress Notes (Signed)
[facility] Progress Note     Subjective: Alert and oriented this morning. Vitals stable, labs unremarkable.  Objective: Vital signs in last 24 hours: Temp:  [97.6 F (36.4 C)-99.2 F (37.3 C)] 98.7 F (37.1 C) (09/18 1134) Pulse Rate:  [89-101] 101 (09/18 1134) Resp:  [14-20] 20 (09/18 1134) BP: (132-146)/(77-107) 140/107 (09/18 1134) SpO2:  [91 %-98 %] 95 % (09/18 1134) Last BM Date: 12/08/19  Intake/Output from previous day: 09/17 0701 - 09/18 0700 In: 600 [P.O.:600] Out: 2650 [Urine:2650] Intake/Output this shift: No intake/output data recorded.  PE: Gen: Alert, NAD, pleasant HEENT: Pupils equal and round. Card: RRR Pulm: CTAB, no W/R/R, effort normal. Abd: Soft, nondistended, nontender to palpation Neuro: no focal deficit, moving all extremities spontaneously Psych: A&Ox4 Skin: no rashes noted, warm and dry   Lab Results:  Recent Labs    12/08/19 0519 12/09/19 0407  WBC 7.4 7.3  HGB 13.5 12.7*  HCT 40.2 39.2  PLT 208 207   BMET Recent Labs    12/08/19 0519 12/09/19 0407  NA 136 139  K 3.9 3.8  CL 108 108  CO2 24 24  GLUCOSE 120* 117*  BUN 14 15  CREATININE 0.93 0.91  CALCIUM 9.5 9.8   PT/INR Recent Labs    12/07/19 0847  LABPROT 15.5*  INR 1.3*   CMP     Component Value Date/Time   NA 139 12/09/2019 0407   NA 138 11/09/2017 1618   K 3.8 12/09/2019 0407   CL 108 12/09/2019 0407   CO2 24 12/09/2019 0407   GLUCOSE 117 (H) 12/09/2019 0407   BUN 15 12/09/2019 0407   BUN 21 11/09/2017 1618   CREATININE 0.91 12/09/2019 0407   CREATININE 1.09 10/17/2015 1154   CALCIUM 9.8 12/09/2019 0407   PROT 6.8 12/03/2019 1036   PROT 7.0 09/14/2018 0839   ALBUMIN 3.4 (L) 12/03/2019 1036   ALBUMIN 4.6 09/14/2018 0839   AST 18 12/03/2019 1036   ALT 12 12/03/2019 1036   ALKPHOS 32 (L) 12/03/2019 1036   BILITOT 1.5 (H) 12/03/2019 1036   BILITOT 0.6 09/14/2018 0839   GFRNONAA >60 12/09/2019 0407   GFRAA >60 12/09/2019 0407    Lipase     Component Value Date/Time   LIPASE 40 10/25/2017 2349       Studies/Results: DG CHEST PORT 1 VIEW  Result Date: 12/07/2019 CLINICAL DATA:  Right chest tube removal EXAM: PORTABLE CHEST 1 VIEW COMPARISON:  Chest x-ray 12/06/2019, CT chest 12/03/2019 FINDINGS: Interval removal of a right chest tube. The heart size and mediastinal contours are unchanged. Coronary artery stent again noted. Left chest wall cardiac defibrillator in similar position. Persistent trace pneumomediastinum. Persistent mild elevation of the left hemidiaphragm. No focal consolidation. No pulmonary edema. No pleural effusion. No pneumothorax. No acute osseous abnormality. Persistent extensive subcutaneus soft tissue edema along the right chest wall and bilateral neck. IMPRESSION: 1. Interval removal of a right chest tube. No pneumothorax bilaterally. 2. Persistent trace pneumomediastinum. 3. Persistent extensive subcutaneus soft tissue edema along the right chest wall and bilateral neck. Electronically Signed   By: Iven Finn M.D.   On: 12/07/2019 15:35    Anti-infectives: Anti-infectives (From admission, onward)   None       Assessment/Plan 74Ms/pmechanicalfall SDH/SAH- Per Dr. Christella Noa of [place]. Coumadin reversed w/ K Centra (9/12). INR 1.3 yesterday. Repeat [facility] 9/13with stable SDH, slightly increased SAH. Repeat [facility] 9/14 PM w/ hemorrhage of the right tentorial leaflet w/ mass effect that is stable +  scattered foci of SAH.  Keppra. 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 Appreciate recommendations, plan for outpatient neurology follow up. Large right side PTX with subcutaneous emphysema- Pneumothorax resolved, chest tube removed. Right sided 2-4 rib fractures- Multimodal pain control. Pulm toilet. PT/OT HTN- BP normal last 24 hours, no hypotension. On low dose coreg and PRN hydralazine. Hx ofCHF-Echo 9/13  w/EF30-35%, severe akinesis, aneurysmal apex.There was an inferoapical filling defect felt to less to be a thrombus. Cards consulted and felt this is likely artifact/shadowing.Conttelemetry. No evidence of fluid overload at this time. Holding Lasixgiven soft BP.Stop IV fluids. Restart Entresto and Propanolol when BP is higher. PVC's - PVC's noted on tele. Will await further recs from cards.   OSA- continue home CPAP Hx CAD(prior LHC 07/2019) - cards following H/orecurrentDVT(+ Lupus anticoagulant)- Coumadin reversed w/ K Centra (9/12). INR 1.4this AM. Holding Coumadin. Daily INR and CBC. NSGY recommending no anticoagulation in the future with frequent falls (3 falls in the last 3 weeks).Vascular and hematology has seen the patient. No indication for IVC filter at this time.  Infrarenal AAA -Per Vascular. They recommend follow up US in 1 year Multinodular Thyroid - Will need outpatient thyroid US Urinary retention- failed voiding trial 9/15, foley replaced, continue Urecholine.  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 NT on exam.  Hypokalemia - Resolved, potassium 3.8 today ABL Anemia - Resolved FEN -Reg, gentle IVF. Home PPI. Bowel regimen. VTE -SCDs, hold chemical prophylaxis ID -None. UA and BCx ordered for confusion 9/14 PM - both negative. Foley - Keep in place for urinary retention. Dispo - Labs pending. TBI therapies. PT currently recommending CIR, working on placement.   LOS: 6 days    Dwan Bolt, [facility] 12/09/2019, 12:15 PM Please see Amion for pager number during day hours 7:00am-4:30pm

## 2019-12-09 NOTE — Progress Notes (Signed)
Patient ID: Kevin Hart, male   DOB: 07/31/1945, 74 y.o.   MRN: [id number] BP (!) 146/97 (BP Location: Right Arm)    Pulse 92    Temp 97.6 F (36.4 C)    Resp 20    Ht 5\' 10"  (1.778 m)    Wt 93 kg    SpO2 94%    BMI 29.41 kg/m  Alert and oriented x 4, speech is clear Moving all extremities perrl Symmetric facies No change neurologically

## 2019-12-10 LAB — BASIC METABOLIC PANEL
Anion gap: 9 (ref 5–15)
BUN: 17 mg/dL (ref 8–23)
CO2: 24 mmol/L (ref 22–32)
Calcium: 9.7 mg/dL (ref 8.9–10.3)
Chloride: 106 mmol/L (ref 98–111)
Creatinine, Ser: 0.91 mg/dL (ref 0.61–1.24)
GFR calc Af Amer: >60 mL/min (ref >60)
GFR calc non Af Amer: >60 mL/min (ref >60)
Glucose, Bld: 117 mg/dL — ABNORMAL HIGH (ref 70–99)
Potassium: 4.1 mmol/L (ref 3.5–5.1)
Sodium: 139 mmol/L (ref 135–145)

## 2019-12-10 LAB — CULTURE, BLOOD (ROUTINE X 2)
Culture: NO GROWTH
Culture: NO GROWTH
Special Requests: ADEQUATE

## 2019-12-10 MED ORDER — SACUBITRIL-VALSARTAN 24-26 MG PO TABS
1.0000 | ORAL_TABLET | Freq: Every day | ORAL | Status: DC
  Administered 2019-12-10 – 2019-12-13 (×4): 1 via ORAL
  Filled 2019-12-10 (×4): qty 1

## 2019-12-10 MED ORDER — POLYETHYLENE GLYCOL 3350 17 G PO PACK: 17.0000 g | PACK | Freq: Every day | ORAL | Status: DC | PRN

## 2019-12-10 NOTE — Progress Notes (Signed)
NEUROSURGERY PROGRESS NOTE  No acute events overnight. Sustained a SDH and SAH on 9/12. Has been stable from a nsgy standpoint since. Awaiting CIR placement.   Temp:  [97.6 F (36.4 C)-99.1 F (37.3 C)] 97.6 F (36.4 C) (09/19 0742) Pulse Rate:  [78-101] 89 (09/19 0742) Resp:  [16-20] 20 (09/19 0742) BP: (101-143)/(64-107) 143/99 (09/19 0742) SpO2:  [94 %-96 %] 95 % (09/19 0742)   Eleonore Chiquito, NP 12/10/2019 8:42 AM

## 2019-12-10 NOTE — Progress Notes (Signed)
[facility] Progress Note     Subjective: Alert, oriented, and conversant this morning. Asking when he will be discharged. Per bedside RN, patient's wife is concerned about frequency of stools, requesting decrease in miralax dosing.  Objective: Vital signs in last 24 hours: Temp:  [97.6 F (36.4 C)-99.1 F (37.3 C)] 97.6 F (36.4 C) (09/19 0742) Pulse Rate:  [78-101] 89 (09/19 0742) Resp:  [16-20] 20 (09/19 0742) BP: (101-143)/(64-107) 143/99 (09/19 0742) SpO2:  [94 %-96 %] 95 % (09/19 0742) Last BM Date: 12/09/19  Intake/Output from previous day: 09/18 0701 - 09/19 0700 In: 240 [P.O.:240] Out: 1325 [Urine:1325] Intake/Output this shift: Total I/O In: -  Out: 550 [Urine:550]  PE: Gen: Alert, NAD, pleasant HEENT: Pupils equal and round. Card: RRR Pulm: CTAB, no W/R/R, effort normal. Abd: Soft, nondistended, nontender to palpation Neuro: no focal deficit, moving all extremities spontaneously Psych: A&Ox4 Skin: no rashes noted, warm and dry   Lab Results:  Recent Labs    12/08/19 0519 12/09/19 0407  WBC 7.4 7.3  HGB 13.5 12.7*  HCT 40.2 39.2  PLT 208 207   BMET Recent Labs    12/09/19 0407 12/10/19 0329  NA 139 139  K 3.8 4.1  CL 108 106  CO2 24 24  GLUCOSE 117* 117*  BUN 15 17  CREATININE 0.91 0.91  CALCIUM 9.8 9.7   PT/INR No results for input(s): LABPROT, INR in the last 72 hours. CMP     Component Value Date/Time   NA 139 12/10/2019 0329   NA 138 11/09/2017 1618   K 4.1 12/10/2019 0329   CL 106 12/10/2019 0329   CO2 24 12/10/2019 0329   GLUCOSE 117 (H) 12/10/2019 0329   BUN 17 12/10/2019 0329   BUN 21 11/09/2017 1618   CREATININE 0.91 12/10/2019 0329   CREATININE 1.09 10/17/2015 1154   CALCIUM 9.7 12/10/2019 0329   PROT 6.8 12/03/2019 1036   PROT 7.0 09/14/2018 0839   ALBUMIN 3.4 (L) 12/03/2019 1036   ALBUMIN 4.6 09/14/2018 0839   AST 18 12/03/2019 1036   ALT 12 12/03/2019 1036   ALKPHOS 32 (L) 12/03/2019 1036    BILITOT 1.5 (H) 12/03/2019 1036   BILITOT 0.6 09/14/2018 0839   GFRNONAA >60 12/10/2019 0329   GFRAA >60 12/10/2019 0329   Lipase     Component Value Date/Time   LIPASE 40 10/25/2017 2349       Studies/Results: No results found.  Anti-infectives: Anti-infectives (From admission, onward)   None       Assessment/Plan 74Ms/pmechanicalfall SDH/SAH- Per Dr. Christella Noa of [place]. Coumadin reversed w/ [facility] (9/12). INR 1.3 yesterday. Repeat [facility] 9/13with stable SDH, slightly increased SAH. Repeat [facility] 9/14 PM w/ hemorrhage of the right tentorial leaflet w/ mass effect that is stable + scattered foci of SAH.  Keppra. 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 Appreciate recommendations, plan for outpatient neurology follow up. Large right side PTX with subcutaneous emphysema- Pneumothorax resolved, chest tube removed. Right sided 2-4 rib fractures- Multimodal pain control. Pulm toilet. PT/OT HTN- BP normal last 24 hours, no hypotension. On low dose coreg and PRN hydralazine. Hx ofCHF-Echo 9/13 w/EF30-35%, severe akinesis, aneurysmal apex.There was an inferoapical filling defect felt to less to be a thrombus. Cards consulted and felt this is likely artifact/shadowing.Conttelemetry. No evidence of fluid overload at this time. SBP in 140s, will resume home Entresto today, check BMP tomorrow to monitor renal function. OSA- continue home  CPAP Hx CAD(prior LHC 07/2019) - cards following H/orecurrentDVT(+ Lupus anticoagulant)- Coumadin reversed w/ [facility] (9/12). INR 1.4this AM. Holding Coumadin. Daily INR and CBC. NSGY recommending no anticoagulation in the future with frequent falls (3 falls in the last 3 weeks).Vascular and hematology has seen the patient. No indication for IVC filter at this time.  Infrarenal AAA -Per Vascular. They recommend follow up US in 1 year Multinodular Thyroid - Will  need outpatient thyroid US Urinary retention- failed voiding trial 9/15, foley replaced. Repeat voiding trial today. continue Urecholine.  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 NT on exam.  FEN -Reg diet. Home PPI. Bowel regimen. VTE -SCDs, hold chemical prophylaxis ID -None. UA and BCx ordered for confusion 9/14 PM - both negative. Foley - Keep in place for urinary retention. Dispo - TBI therapies. PT currently recommending CIR, working on placement.   LOS: 7 days    Kevin Hart, [facility] 12/10/2019, 8:48 AM Please see Amion for pager number during day hours 7:00am-4:30pm

## 2019-12-11 LAB — BASIC METABOLIC PANEL
Anion gap: 12 (ref 5–15)
BUN: 17 mg/dL (ref 8–23)
CO2: 20 mmol/L — ABNORMAL LOW (ref 22–32)
Calcium: 9.9 mg/dL (ref 8.9–10.3)
Chloride: 112 mmol/L — ABNORMAL HIGH (ref 98–111)
Creatinine, Ser: 0.91 mg/dL (ref 0.61–1.24)
GFR calc Af Amer: >60 mL/min (ref >60)
GFR calc non Af Amer: >60 mL/min (ref >60)
Glucose, Bld: 122 mg/dL — ABNORMAL HIGH (ref 70–99)
Potassium: 4.7 mmol/L (ref 3.5–5.1)
Sodium: 144 mmol/L (ref 135–145)

## 2019-12-11 MED ORDER — CHLORHEXIDINE GLUCONATE CLOTH 2 % EX PADS
6.0000 | MEDICATED_PAD | Freq: Every day | CUTANEOUS | Status: DC
  Administered 2019-12-11 – 2019-12-15 (×5): 6 via TOPICAL

## 2019-12-11 NOTE — Progress Notes (Signed)
Physical Therapy Treatment Patient Details Name: Kevin Hart MRN: [id number] DOB: 1945/12/14 Today's Date: 12/11/2019    History of Present Illness 74 yo male s/p fall with R cerebellum SDH SAH R side 2-4 rib fxs and Large R PTX with subcutaneous emphysema Pt iwth x3 falls in 3 weeks.  PMH OSA CAD DVT HTN arthritis bil cataracts CHF lupus MI 2015 s/p fall with SAH scoliosis    PT Comments    Pt interactive and able to follow commands. Pt with generalized deconditioning with R side weaker than the L. Pt with significant R lateral lean in sitting and standing requiring maxA to maintain balance. Pt unable to achieve bilat terminal knee extension or elbow extension to achieve full upright standing. Spoke with wife who strongly desires to take him on their annual beach trip next month through November. Cont to recommend CIR upon d/c as pt demonstrates rehab potential to become minA from w/c level and safe to return home with spouse and attend vacation with appropriate DME and assist.    Follow Up Recommendations  CIR;Supervision/Assistance - 24 hour     Equipment Recommendations  Wheelchair (measurements PT);Wheelchair cushion (measurements PT);Hospital bed    Recommendations for Other Services Rehab consult     Precautions / Restrictions Precautions Precautions: Fall Precaution Comments: R lateral lean Restrictions Weight Bearing Restrictions: No    Mobility  Bed Mobility Overal bed mobility: Needs Assistance Bed Mobility: Rolling;Sidelying to Sit;Sit to Sidelying Rolling: Max assist Sidelying to sit: Max assist;+2 for physical assistance     Sit to sidelying: Max assist;+2 for physical assistance General bed mobility comments: pt requiring hand over hand to move L UE to assist with rolling, maxAx2 for trunk elevation, minimal use of R UE and LE  Transfers Overall transfer level: Needs assistance Equipment used: Ambulation equipment used Transfers: Sit to/from Stand Sit  to Stand: Max assist;+2 safety/equipment         General transfer comment: stood x 3 up in stedy, pt with significant R lateral lean despite knees blocked by front of stedy and maxA at R trunk to achieve full upright posture, pt then became incontinent of stool, unable to maintain upright standing for hygiene, had to return to supine for hygiene  Ambulation/Gait                 Stairs             Wheelchair Mobility    Modified Rankin (Stroke Patients Only) Modified Rankin (Stroke Patients Only) Pre-Morbid Rankin Score: No symptoms Modified Rankin: Severe disability     Balance Overall balance assessment: Needs assistance Sitting-balance support: Bilateral upper extremity supported;Feet supported Sitting balance-Leahy Scale: Poor Sitting balance - Comments: significant R Lateral lean despite L UE holding onto foot of bed, maxA to maintain midline, pt also with posterior lean Postural control: Right lateral lean Standing balance support: Bilateral upper extremity supported Standing balance-Leahy Scale: Poor Standing balance comment: dependent on physical assist                            Cognition Arousal/Alertness: Awake/alert Behavior During Therapy: Flat affect Overall Cognitive Status: Impaired/Different from baseline                         Following Commands: Follows one step commands with increased time Safety/Judgement: Decreased awareness of deficits (unable to state R lateral lean or correct) Awareness: Emergent (able to state  he needed to have BM) Problem Solving: Slow processing;Decreased initiation;Difficulty sequencing;Requires verbal cues;Requires tactile cues General Comments: Pt slow to respond, difficulty sequencing      Exercises Other Exercises Other Exercises: bilat UE AAROM to shld and elbows    General Comments General comments (skin integrity, edema, etc.): pt with bruising on L UE      Pertinent  Vitals/Pain Pain Assessment: No/denies pain Pain Location: but will report some L UE discomfort with moving    Home Living                      Prior Function            PT Goals (current goals can now be found in the care plan section) Progress towards PT goals: Progressing toward goals    Frequency    Min 4X/week      PT Plan Current plan remains appropriate    Co-evaluation              AM-PAC PT "6 Clicks" Mobility   Outcome Measure  Help needed turning from your back to your side while in a flat bed without using bedrails?: A Lot Help needed moving from lying on your back to sitting on the side of a flat bed without using bedrails?: A Lot Help needed moving to and from a bed to a chair (including a wheelchair)?: Total Help needed standing up from a chair using your arms (e.g., wheelchair or bedside chair)?: Total Help needed to walk in hospital room?: Total Help needed climbing 3-5 steps with a railing? : Total 6 Click Score: 8    End of Session Equipment Utilized During Treatment: Gait belt Activity Tolerance: Patient tolerated treatment well Patient left: in bed;with call bell/phone within reach;with bed alarm set;with nursing/sitter in room Nurse Communication: Mobility status PT Visit Diagnosis: Unsteadiness on feet (R26.81)     Time: 0712-1975 PT Time Calculation (min) (ACUTE ONLY): 24 min  Charges:  $Gait Training: 8-22 mins $Therapeutic Activity: 8-22 mins                     Kittie Plater, PT, DPT Acute Rehabilitation Services Pager #: [phone] Office #: [phone]    Berline Lopes 12/11/2019, 12:38 PM

## 2019-12-11 NOTE — Progress Notes (Addendum)
Inpatient Rehabilitation Admissions Coordinator  I will begin insurance authorization with [employer] for a possible Cir admit pending their approval. PT to see patient today for updated therapy treatment assessment.  Danne Baxter, RN, MSN Rehab Admissions Coordinator [phone] 12/11/2019 10:30 AM  I spoke with pt's wife, by phone and she is in agreement to pursuing CIR admit.  Danne Baxter, RN, MSN Rehab Admissions Coordinator [phone] 12/11/2019 11:39 AM

## 2019-12-11 NOTE — Progress Notes (Signed)
   Subjective/Chief Complaint: Pt with no acute changes   Objective: Vital signs in last 24 hours: Temp:  [97.6 F (36.4 C)-99 F (37.2 C)] 98.6 F (37 C) (09/20 0754) Pulse Rate:  [88-101] 94 (09/20 0400) Resp:  [14-20] 14 (09/20 0400) BP: (87-131)/(53-98) 115/98 (09/20 0754) SpO2:  [90 %-99 %] 99 % (09/20 0754) Last BM Date: 12/11/19  Intake/Output from previous day: 09/19 0701 - 09/20 0700 In: -  Out: 1825 [Urine:1825] Intake/Output this shift: No intake/output data recorded.  PE: Gen: Alert, NAD, pleasant HEENT: Pupils equal and round. Card: RRR Pulm: CTAB, no W/R/R, effort normal. Abd: Soft, nondistended, nontender to palpation Neuro: no focal deficit, moving all extremities spontaneously Psych: A&Ox4 Skin: no rashes noted, warm and dry   Lab Results:  Recent Labs    12/09/19 0407  WBC 7.3  HGB 12.7*  HCT 39.2  PLT 207   BMET Recent Labs    12/10/19 0329 12/11/19 0330  NA 139 144  K 4.1 4.7  CL 106 112*  CO2 24 20*  GLUCOSE 117* 122*  BUN 17 17  CREATININE 0.91 0.91  CALCIUM 9.7 9.9   PT/INR No results for input(s): LABPROT, INR in the last 72 hours. ABG No results for input(s): PHART, HCO3 in the last 72 hours.  Invalid input(s): PCO2, PO2  Studies/Results: No results found.  Anti-infectives: Anti-infectives (From admission, onward)   None      Assessment/Plan: 74Ms/pmechanicalfall SDH/SAH- Per Dr. Christella Noa of [place]. Coumadin reversed w/ [facility] (9/12). INR 1.3 yesterday. Repeat CTH 9/13with stable SDH, slightly increased SAH.Repeat [facility] 9/14 PM w/ hemorrhage of the right tentorial leaflet w/ mass effect that is stable + scattered foci of SAH.Keppra. 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 Appreciate recommendations, plan for outpatient neurology follow up. Large right side PTX with subcutaneous emphysema- Pneumothorax resolved, chest tube  removed. Right sided 2-4 rib fractures- Multimodal pain control. Pulm toilet. PT/OT HTN- BPnormal last 24 hours, no hypotension. On low dose coreg and PRN hydralazine. Hx ofCHF-Echo 9/13 w/EF30-35%, severe akinesis, aneurysmal apex.There was an inferoapical filling defect felt to less to be a thrombus.Cards consulted and felt this is likely artifact/shadowing.Conttelemetry. No evidence of fluid overload at this time. SBP in 140s, will resume home Entresto today, check BMP tomorrow to monitor renal function. OSA- continue home CPAP Hx CAD(prior LHC 07/2019)- cards following H/orecurrentDVT(+ Lupus anticoagulant)- Coumadin reversed w/ [facility] (9/12). INR 1.4this AM. Holding Coumadin. Daily INR and CBC. NSGY recommending no anticoagulation in the future with frequent falls (3 falls in the last 3 weeks).Vascular and hematology has seen the patient. No indication for IVC filter at this time. Infrarenal AAA -Per Vascular. They recommend follow up US in 1 year Multinodular Thyroid - Will need outpatient thyroid US Urinary retention-failed voiding trial 9/15, foley replaced. Repeat voiding trial today. continueUrecholine. 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 NT on exam.  FEN -Reg diet. Home PPI. Bowel regimen. VTE -SCDs, hold chemical prophylaxis ID -None. UA and BCx ordered for confusion 9/14 PM - both negative. Foley -Keep in place for urinary retention. Dispo - TBI therapies. PT currently recommending CIR, working on placement.   LOS: 8 days    Ralene Ok 12/11/2019

## 2019-12-12 ENCOUNTER — Ambulatory Visit: Payer: PPO | Admitting: Physical Therapy

## 2019-12-12 DIAGNOSIS — Z515 Encounter for palliative care: Secondary | ICD-10-CM

## 2019-12-12 DIAGNOSIS — S065X9A Traumatic subdural hemorrhage with loss of consciousness of unspecified duration, initial encounter: Secondary | ICD-10-CM

## 2019-12-12 MED ORDER — PANTOPRAZOLE SODIUM 40 MG PO TBEC
40.0000 mg | DELAYED_RELEASE_TABLET | Freq: Every day | ORAL | Status: DC
  Administered 2019-12-12 – 2019-12-13 (×2): 40 mg via ORAL
  Filled 2019-12-12 (×2): qty 1

## 2019-12-12 MED ORDER — POTASSIUM CHLORIDE CRYS ER 20 MEQ PO TBCR: 20.0000 meq | EXTENDED_RELEASE_TABLET | Freq: Every day | ORAL | Status: DC

## 2019-12-12 MED ORDER — PROPRANOLOL HCL 10 MG PO TABS
10.0000 mg | ORAL_TABLET | Freq: Two times a day (BID) | ORAL | Status: DC
  Administered 2019-12-12 – 2019-12-13 (×3): 10 mg via ORAL
  Filled 2019-12-12 (×3): qty 1

## 2019-12-12 MED ORDER — ENOXAPARIN SODIUM 30 MG/0.3ML ~~LOC~~ SOLN
30.0000 mg | Freq: Two times a day (BID) | SUBCUTANEOUS | Status: DC
  Administered 2019-12-12 (×2): 30 mg via SUBCUTANEOUS
  Filled 2019-12-12 (×3): qty 0.3

## 2019-12-12 MED ORDER — ACETAMINOPHEN 500 MG PO TABS
1000.0000 mg | ORAL_TABLET | Freq: Four times a day (QID) | ORAL | Status: DC
  Administered 2019-12-12 – 2019-12-13 (×5): 1000 mg via ORAL
  Filled 2019-12-12 (×5): qty 2

## 2019-12-12 MED ORDER — ALLOPURINOL 300 MG PO TABS
150.0000 mg | ORAL_TABLET | Freq: Two times a day (BID) | ORAL | Status: DC
  Administered 2019-12-12 – 2019-12-13 (×3): 150 mg via ORAL
  Filled 2019-12-12 (×3): qty 1

## 2019-12-12 MED ORDER — TAMSULOSIN HCL 0.4 MG PO CAPS
0.4000 mg | ORAL_CAPSULE | Freq: Every day | ORAL | Status: DC
  Administered 2019-12-12 – 2019-12-13 (×2): 0.4 mg via ORAL
  Filled 2019-12-12 (×2): qty 1

## 2019-12-12 MED ORDER — PROPRANOLOL HCL 10 MG PO TABS: 20.0000 mg | ORAL_TABLET | Freq: Two times a day (BID) | ORAL | Status: DC

## 2019-12-12 MED ORDER — ASCORBIC ACID 500 MG PO TABS
500.0000 mg | ORAL_TABLET | Freq: Every day | ORAL | Status: DC
  Administered 2019-12-12 – 2019-12-13 (×2): 500 mg via ORAL
  Filled 2019-12-12 (×2): qty 1

## 2019-12-12 NOTE — Consult Note (Signed)
Palliative Care  Consultation RE: Goals of care  74 yo with multiple chronic medical problems and recent falls at home with functional status decline over the past 6 months. He was admitted s/p fall with SDH and SAH. Despite the decline prior to admission, he was still driving about 2 weeks ago. He has remained cognitively intact and is able to have a conversation with me. He has some minor word finding issues and delayed response time. He is eating well and working with therapy.  I had a family meeting that included patient, his daughter and his wife Thayer Headings. Both his wife and his daughter are nurses. They have realistic goals for the future and acknowledge the uncertainty of his illness trajectory and recovery potential but very much want to give him every best chance to recover as much function as possible to improve his QOL and to maximize the quality of the time he has left. Their main goal is to be able to take him to the beach in [place] trip they have been doing for many years and something that he enjoys. Family understand he will have increased care needs.  I addressed advance directives-will scan in his living will and HCPOA. He has a DNR order on file and I will update his orders to reflect this.  We discussed a referral to hospice care services after CIR with the intention to not be re-hospitalizaed and to be kept comfortable in the event of another serious fall or decline in his health.  Recommendation:  1. DNR order placed and ACP updated 2. CIR with focused effort on QOL and on helping him gain as much independence as possible.  3. Consider Hospice referral at time of discharge home from CIR and ongoing palliative care support - I prepared family for the variable and unpredictable illness trajectory ahead. Our team will be a safety net and are available to support or needed. 4. Fortunately he does not appear to be having pain or distress and has no other identified symptom  management needs.  Lane Hacker, DO Palliative Medicine  Time: 50 min Greater than 50%  of this time was spent counseling and coordinating care related to the above assessment and plan.

## 2019-12-12 NOTE — Progress Notes (Signed)
Patient identified as being high risk for 72-month mortality based on pre-admission functional status decline, frequent falls, cardiac history and traumatic SAH and SDH. Chart reviewed and discussion noted about considerations for hospice care even before this acute event. Patient has shown progression with PT/OT therapies and has been recommended for CIR in hopes of regaining enough function to make a beach trip with his family. They will need to know how to care for his needs moving forward and it is unlikely he will return to his baseline functional status prior to admission and may need a much different level of care and support than they previously provided at home.   If the primary service feels a palliative care referral is appropriate for serious illness support and a discussion of goals of care please place order in Epic. Patient does not have any advance care planning documents on file and has a FULL CODE order presently.  Lane Hacker, DO Palliative Medicine

## 2019-12-12 NOTE — Progress Notes (Signed)
Physical Therapy Treatment Patient Details Name: Kevin Hart MRN: [id number] DOB: 07/08/1945 Today's Date: 12/12/2019    History of Present Illness 74 yo male s/p fall with R cerebellum SDH SAH R side 2-4 rib fxs and Large R PTX with subcutaneous emphysema Pt iwth x3 falls in 3 weeks.  PMH OSA CAD DVT HTN arthritis bil cataracts CHF lupus MI 2015 s/p fall with SAH scoliosis    PT Comments    Pt with improved sitting EOB ability today and significantly less R lateral lean. Pt very rigid with minimal ROM at bilat shoulders. Pt with thoracic kyphosis and forward head requiring modAx2 to help assist pt into extension for passive stretch. Pt giving valiant effort during transfers however remains to be very weak and cont to requires maxAx2. Family strongly desires to take patient to the beach next month as they're unsure of his medical prognosis and his life expectentcy. Recommending CIR to allow for patient to achieve w/c level of mobility and for family education on how to assist patient properly for both the patient and their safety. Acute PT to cont to follow.   Follow Up Recommendations  CIR;Supervision/Assistance - 24 hour     Equipment Recommendations  Wheelchair (measurements PT);Wheelchair cushion (measurements PT);Hospital bed    Recommendations for Other Services Rehab consult     Precautions / Restrictions Precautions Precautions: Fall Restrictions Weight Bearing Restrictions: No    Mobility  Bed Mobility Overal bed mobility: Needs Assistance Bed Mobility: Rolling;Sidelying to Sit Rolling: Max assist Sidelying to sit: Max assist;+2 for physical assistance       General bed mobility comments: maxA to roll to the R with hand over hand on L hand to reach for bed rail, maxAx2 for trunk elevation to sit up at EOB, had to physical assist pt to let go of railing  Transfers Overall transfer level: Needs assistance Equipment used: Ambulation equipment used Transfers: Sit  to/from Stand Sit to Stand: Max assist;+2 safety/equipment Stand pivot transfers: Total assist;+2 physical assistance (used the stedy)       General transfer comment: pt completed 2 sit to stands today, remains uanble to achieve full upright trunk extension, pt with rigid, kyphotic thoracic spine and forward head  Ambulation/Gait                 Stairs             Wheelchair Mobility    Modified Rankin (Stroke Patients Only) Modified Rankin (Stroke Patients Only) Pre-Morbid Rankin Score: No symptoms Modified Rankin: Severe disability     Balance Overall balance assessment: Needs assistance Sitting-balance support: Bilateral upper extremity supported;Feet supported Sitting balance-Leahy Scale: Poor Sitting balance - Comments: pt with improved ability to maintain midline EOB posture today, requiring minA, pt with significantly less R lateral posterior lean Postural control: Right lateral lean Standing balance support: Bilateral upper extremity supported Standing balance-Leahy Scale: Poor Standing balance comment: dependent on physical assist                            Cognition Arousal/Alertness: Awake/alert Behavior During Therapy: Flat affect Overall Cognitive Status: Impaired/Different from baseline Area of Impairment: Problem solving;Following commands                       Following Commands: Follows one step commands inconsistently;Follows one step commands with increased time Safety/Judgement: Decreased awareness of deficits   Problem Solving: Slow processing;Difficulty sequencing;Requires verbal cues;Requires  tactile cues General Comments: Pt slow to respond but was following commands better, pt difficulty sequencing      Exercises Other Exercises Other Exercises: worked on trunk extension and bilat shld ROM in stedy    General Comments General comments (skin integrity, edema, etc.): pt with bruising      Pertinent  Vitals/Pain Pain Assessment: No/denies pain    Home Living                      Prior Function            PT Goals (current goals can now be found in the care plan section) Progress towards PT goals: Progressing toward goals    Frequency    Min 4X/week      PT Plan Current plan remains appropriate    Co-evaluation              AM-PAC PT "6 Clicks" Mobility   Outcome Measure  Help needed turning from your back to your side while in a flat bed without using bedrails?: A Lot Help needed moving from lying on your back to sitting on the side of a flat bed without using bedrails?: A Lot Help needed moving to and from a bed to a chair (including a wheelchair)?: Total Help needed standing up from a chair using your arms (e.g., wheelchair or bedside chair)?: Total Help needed to walk in hospital room?: Total Help needed climbing 3-5 steps with a railing? : Total 6 Click Score: 8    End of Session Equipment Utilized During Treatment: Gait belt Activity Tolerance: Patient tolerated treatment well Patient left: in chair;with call bell/phone within reach;with chair alarm set Nurse Communication: Mobility status PT Visit Diagnosis: Unsteadiness on feet (R26.81)     Time: 3810-1751 PT Time Calculation (min) (ACUTE ONLY): 25 min  Charges:  $Therapeutic Activity: 8-22 mins $Neuromuscular Re-education: 8-22 mins                     Kittie Plater, PT, DPT Acute Rehabilitation Services Pager #: [phone] Office #: [phone]    Berline Lopes 12/12/2019, 1:14 PM

## 2019-12-12 NOTE — Progress Notes (Signed)
Inpatient Rehabilitation Admissions Coordinator  I await peer to peer with [employer] and [employer] MD, Dr. Amalia Hailey today for rehab venue determination. I contacted Legrand Como PA with Trauma this morning with peer to peer contact information. I met with patient and daughter at bedside and gave updates.  Danne Baxter, RN, MSN Rehab Admissions Coordinator [phone] 12/12/2019 12:52 PM

## 2019-12-12 NOTE — Progress Notes (Signed)
° °  Trauma/Critical Care Follow Up Note  Subjective:    Overnight Issues:   Objective:  Vital signs for last 24 hours: Temp:  [97.6 F (36.4 C)-99 F (37.2 C)] 97.6 F (36.4 C) (09/21 0747) Pulse Rate:  [72-100] 100 (09/21 0747) Resp:  [16] 16 (09/21 0747) BP: (95-135)/(63-91) 135/91 (09/21 0747) SpO2:  [93 %-97 %] 95 % (09/21 0747)  Hemodynamic parameters for last 24 hours:    Intake/Output from previous day: 09/20 0701 - 09/21 0700 In: -  Out: 1200 [Urine:1200]  Intake/Output this shift: No intake/output data recorded.  Vent settings for last 24 hours:    Physical Exam:  Gen: comfortable, no distress Neuro: non-focal exam HEENT: PERRL Neck: supple CV: RRR Pulm: unlabored breathing on RA Abd: soft, NT GU: clear yellow urine Extr: wwp, no edema   No results found for this or any previous visit (from the past 24 hour(s)).  Assessment & Plan: The plan of care was discussed with the bedside nurse for the day, who is in agreement with this plan and no additional concerns were raised.   Present on Admission:  Tension pneumothorax    LOS: 9 days   Additional comments:I reviewed the patient's new clinical lab test results.   and I reviewed the patients new imaging test results.    GLF, mechanical  SDH/SAH- Per Dr. Christella Noa of [place]. Coumadin reversed w/ K Centra (9/12). INR 1.3 yesterday. Repeat CTH 9/13with stable SDH, slightly increased SAH.Repeat [facility] 9/14 PM w/ hemorrhage of the right tentorial leaflet w/ mass effect that is stable + scattered foci of SAH.Keppra is home med, restarted. 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 Appreciate recommendations, plan for outpatient neurology follow up. Large right side PTX with subcutaneous emphysema- Pneumothorax resolved, chest tube removed. Right sided 2-4 rib fractures- Multimodal pain control. Pulm toilet. PT/OT HTN- BPnormal last  24 hours, no hypotension. Home Entresto restarted, hold if evidence of AKI. D/c coreg today and resume home propanolol at half dose.   Hx ofCHF-Echo 9/13 w/EF30-35%, severe akinesis, aneurysmal apex.There was an inferoapical filling defect felt to less to be a thrombus.Cards consulted and felt this is likely artifact/shadowing.Conttelemetry. No evidence of fluid overload at this time.  OSA- continue home CPAP Hx CAD(prior LHC 07/2019)- cards following H/orecurrentDVT(+ Lupus anticoagulant)- Coumadin reversed w/ K Centra (9/12). INR 1.4this AM. Holding Coumadin. Daily INR and CBC. NSGY recommending no anticoagulation in the future with frequent falls (3 falls in the last 3 weeks).Vascular and hematology has seen the patient. No indication for IVC filter at this time. Infrarenal AAA -Per Vascular. They recommend follow up US in 1 year Multinodular Thyroid - Will need outpatient thyroid US Urinary retention, foley in place-failed voiding trial 9/15, foley replaced. 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 NT on exam.  FEN -Regdiet. Home PPI. Bowel regimen. VTE -SCDs, LMWH Dispo - TBI therapies. PT currently recommending CIR, working on placement. Palliative care consult as patient was previously undergoing eval for hospice.  Jesusita Oka, MD Trauma & General Surgery Please use [email or web address] to contact on call provider  12/12/2019  *Care during the described time interval was provided by me. I have reviewed this patient's available data, including medical history, events of note, physical examination and test results as part of my evaluation.

## 2019-12-13 ENCOUNTER — Inpatient Hospital Stay (HOSPITAL_COMMUNITY): Payer: PPO

## 2019-12-13 LAB — CBC
HCT: 41.6 % (ref 39.0–52.0)
Hemoglobin: 13.3 g/dL (ref 13.0–17.0)
MCH: 29.8 pg (ref 26.0–34.0)
MCHC: 32 g/dL (ref 30.0–36.0)
MCV: 93.1 fL (ref 80.0–100.0)
Platelets: 210 10*3/uL (ref 150–400)
RBC: 4.47 MIL/uL (ref 4.22–5.81)
RDW: 13.7 % (ref 11.5–15.5)
WBC: 15.6 10*3/uL — ABNORMAL HIGH (ref 4.0–10.5)
nRBC: 0 % (ref 0.0–0.2)

## 2019-12-13 LAB — BLOOD GAS, ARTERIAL
Acid-Base Excess: 3 mmol/L — ABNORMAL HIGH (ref 0.0–2.0)
Bicarbonate: 26.5 mmol/L (ref 20.0–28.0)
Drawn by: 548871
FIO2: 21
O2 Saturation: 94.4 %
Patient temperature: 37
pCO2 arterial: 37 mmHg (ref 32.0–48.0)
pH, Arterial: 7.469 — ABNORMAL HIGH (ref 7.350–7.450)
pO2, Arterial: 66 mmHg — ABNORMAL LOW (ref 83.0–108.0)

## 2019-12-13 LAB — BASIC METABOLIC PANEL
Anion gap: 10 (ref 5–15)
BUN: 17 mg/dL (ref 8–23)
CO2: 27 mmol/L (ref 22–32)
Calcium: 9.8 mg/dL (ref 8.9–10.3)
Chloride: 102 mmol/L (ref 98–111)
Creatinine, Ser: 1.11 mg/dL (ref 0.61–1.24)
GFR calc Af Amer: >60 mL/min (ref >60)
GFR calc non Af Amer: >60 mL/min (ref >60)
Glucose, Bld: 146 mg/dL — ABNORMAL HIGH (ref 70–99)
Potassium: 4.1 mmol/L (ref 3.5–5.1)
Sodium: 139 mmol/L (ref 135–145)

## 2019-12-13 LAB — URINALYSIS, ROUTINE W REFLEX MICROSCOPIC
Bilirubin Urine: NEGATIVE
Glucose, UA: NEGATIVE mg/dL
Ketones, ur: NEGATIVE mg/dL
Nitrite: POSITIVE — AB
Protein, ur: 100 mg/dL — AB
RBC / HPF: >50 RBC/hpf — ABNORMAL HIGH (ref 0–5)
Specific Gravity, Urine: 1.028 (ref 1.005–1.030)
WBC, UA: >50 WBC/hpf — ABNORMAL HIGH (ref 0–5)
pH: 5 (ref 5.0–8.0)

## 2019-12-13 LAB — TROPONIN I (HIGH SENSITIVITY)
Troponin I (High Sensitivity): 16 ng/L (ref <18)
Troponin I (High Sensitivity): 13 ng/L (ref <18)

## 2019-12-13 LAB — AMMONIA: Ammonia: 17 umol/L (ref 9–35)

## 2019-12-13 LAB — MAGNESIUM: Magnesium: 2.1 mg/dL (ref 1.7–2.4)

## 2019-12-13 LAB — PHOSPHORUS: Phosphorus: 2.7 mg/dL (ref 2.5–4.6)

## 2019-12-13 MED ORDER — LEVETIRACETAM IN NACL 500 MG/100ML IV SOLN: 500.0000 mg | Freq: Two times a day (BID) | INTRAVENOUS | Status: DC

## 2019-12-13 MED ORDER — LORAZEPAM 2 MG/ML IJ SOLN: 1.0000 mg | INTRAMUSCULAR | Status: DC | PRN

## 2019-12-13 MED ORDER — SODIUM CHLORIDE 0.9 % IV SOLN
1.0000 g | INTRAVENOUS | Status: DC
  Administered 2019-12-13: 1 g via INTRAVENOUS
  Filled 2019-12-13: qty 1

## 2019-12-13 MED ORDER — SODIUM CHLORIDE 0.9 % IV SOLN
1.0000 g | Freq: Three times a day (TID) | INTRAVENOUS | Status: DC
  Filled 2019-12-13 (×3): qty 1

## 2019-12-13 MED ORDER — MORPHINE 100MG IN NS 100ML (1MG/ML) PREMIX INFUSION
1.0000 mg/h | INTRAVENOUS | Status: DC
  Administered 2019-12-13: 1 mg/h via INTRAVENOUS
  Filled 2019-12-13: qty 100

## 2019-12-13 MED ORDER — SODIUM CHLORIDE 0.9 % IV SOLN: INTRAVENOUS | Status: DC

## 2019-12-13 MED ORDER — ACETAMINOPHEN 500 MG PO TABS: 500.0000 mg | ORAL_TABLET | Freq: Two times a day (BID) | ORAL | Status: DC | PRN

## 2019-12-13 MED ORDER — ACETAMINOPHEN 500 MG PO TABS
1000.0000 mg | ORAL_TABLET | Freq: Three times a day (TID) | ORAL | Status: DC
  Administered 2019-12-14 – 2019-12-15 (×2): 1000 mg via ORAL
  Filled 2019-12-13: qty 2

## 2019-12-13 MED ORDER — LEVETIRACETAM IN NACL 500 MG/100ML IV SOLN
500.0000 mg | Freq: Two times a day (BID) | INTRAVENOUS | Status: DC
  Administered 2019-12-13 – 2019-12-15 (×4): 500 mg via INTRAVENOUS
  Filled 2019-12-13 (×5): qty 100

## 2019-12-13 MED ORDER — ACETAMINOPHEN 650 MG RE SUPP
650.0000 mg | Freq: Two times a day (BID) | RECTAL | Status: DC | PRN
  Administered 2019-12-13 – 2019-12-15 (×2): 650 mg via RECTAL
  Filled 2019-12-13 (×3): qty 1

## 2019-12-13 MED ORDER — ACETAMINOPHEN 500 MG PO TABS: 1000.0000 mg | ORAL_TABLET | Freq: Two times a day (BID) | ORAL | Status: DC | PRN

## 2019-12-13 MED ORDER — IBUPROFEN 200 MG PO TABS: 600.0000 mg | ORAL_TABLET | Freq: Once | ORAL | Status: DC

## 2019-12-13 NOTE — Progress Notes (Signed)
Family requested another meeting to discuss goals of care given his rapid and unexpected decline. Patient is unresponsive. His urine is infected. He is not able to swallow, signs of aspiration. BP is low and he is tachycardic. Family feel that he will not be able to tolerate additional rehabilitation efforts that he would not want to be kept alive if he was not going to be fully independent and functional.THey desire comfort care transition and I support this based on his appearance this afternoon and dramatic change in status.  Referral to [facility]. Comfort orders placed.  Lane Hacker, DO Palliative Medicine

## 2019-12-13 NOTE — Progress Notes (Signed)
Occupational Therapy Treatment Patient Details Name: Kevin Hart MRN: [id number] DOB: 1945-04-05 Today's Date: 12/13/2019    History of present illness 74 yo male s/p fall with R cerebellum SDH SAH R side 2-4 rib fxs and Large R PTX with subcutaneous emphysema Pt iwth x3 falls in 3 weeks.  PMH OSA CAD DVT HTN arthritis bil cataracts CHF lupus MI 2015 s/p fall with SAH scoliosis   OT comments  Pt with slow progress towards OT goals, agreeable to working with therapies but presenting with fatigue/lethargy this session. Pt tolerating activity seated EOB with focus on basic ADL, trunk/cervical stretching and sit<>stand attempts at stedy. Pt requiring multimodal cues for initiating mobility/ADL tasks today. VSS throughout. Feel POC remains appropriate at this time. Will continue to follow acutely.   Follow Up Recommendations  CIR    Equipment Recommendations  3 in 1 bedside commode          Precautions / Restrictions Precautions Precautions: Fall Precaution Comments: R lateral lean Restrictions Weight Bearing Restrictions: No       Mobility Bed Mobility Overal bed mobility: Needs Assistance Bed Mobility: Supine to Sit;Sit to Supine     Supine to sit: Max assist;+2 for physical assistance;+2 for safety/equipment Sit to supine: Max assist;+2 for physical assistance;+2 for safety/equipment   General bed mobility comments: pt able to initiate moving LEs towards EOB, assist for scooting hips and to elevate trunk  Transfers Overall transfer level: Needs assistance Equipment used: Ambulation equipment used Transfers: Sit to/from Stand Sit to Stand: Max assist;Total assist;+2 safety/equipment         General transfer comment: x2 attempts to stand from EOB, pt having difficulty motor planning, pushing back with UEs vs pulling/pushing up; able to clear hips from EOB but unable to attain full upright position     Balance Overall balance assessment: Needs  assistance Sitting-balance support: Bilateral upper extremity supported;Feet supported Sitting balance-Leahy Scale: Poor Sitting balance - Comments: continues to present with R lateral lean and decreased initiation to self correct  Postural control: Right lateral lean Standing balance support: Bilateral upper extremity supported Standing balance-Leahy Scale: Zero Standing balance comment: dependent on physical assist                           ADL either performed or assessed with clinical judgement   ADL Overall ADL's : Needs assistance/impaired     Grooming: Wash/dry face;Maximal assistance;Sitting Grooming Details (indicate cue type and reason): with second person providing assist for sitting balance EOB; hand over hand assist to perform task using RUE                             Functional mobility during ADLs: Maximal assistance;+2 for physical assistance;+2 for safety/equipment General ADL Comments: pt lethargic today, continues to have R lateral lean and increased stiffness noted, cervical lateral flexion to the R                        Cognition Arousal/Alertness: Awake/alert Behavior During Therapy: Flat affect Overall Cognitive Status: Impaired/Different from baseline Area of Impairment: Problem solving;Following commands                       Following Commands: Follows one step commands inconsistently;Follows one step commands with increased time Safety/Judgement: Decreased awareness of deficits   Problem Solving: Slow processing;Difficulty sequencing;Requires verbal cues;Requires tactile  cues;Decreased initiation General Comments: pt with decreased engagement today, requires multimodal cues for following simple commands and for initiating ADL/mobility tasks         Exercises Exercises: Other exercises Other Exercises Other Exercises: worked on trunk extension, cervical stretch/extension while seated EOB    Shoulder  Instructions       General Comments      Pertinent Vitals/ Pain       Pain Assessment: No/denies pain  Home Living                                          Prior Functioning/Environment              Frequency  Min 2X/week        Progress Toward Goals  OT Goals(current goals can now be found in the care plan section)  Progress towards OT goals: OT to reassess next treatment  Acute Rehab OT Goals Patient Stated Goal: to inmprove mobility and go to rehab OT Goal Formulation: With patient/family Time For Goal Achievement: 12/18/19 Potential to Achieve Goals: Good ADL Goals Pt Will Perform Lower Body Dressing: with mod assist;sit to/from stand;with adaptive equipment Pt Will Transfer to Toilet: with mod assist;ambulating;bedside commode Additional ADL Goal #1: pt will complete bed mobility min (A) as precursor to adls Additional ADL Goal #2: pt will complete 3 step command 100% accuracy  Plan Discharge plan remains appropriate    Co-evaluation    PT/OT/SLP Co-Evaluation/Treatment: Yes Reason for Co-Treatment: Necessary to address cognition/behavior during functional activity;For patient/therapist safety;To address functional/ADL transfers   OT goals addressed during session: ADL's and self-care      AM-PAC OT "6 Clicks" Daily Activity     Outcome Measure   Help from another person eating meals?: A Lot Help from another person taking care of personal grooming?: A Lot Help from another person toileting, which includes using toliet, bedpan, or urinal?: A Lot Help from another person bathing (including washing, rinsing, drying)?: A Lot Help from another person to put on and taking off regular upper body clothing?: A Lot Help from another person to put on and taking off regular lower body clothing?: Total 6 Click Score: 11    End of Session Equipment Utilized During Treatment: Gait belt  OT Visit Diagnosis: Unsteadiness on feet (R26.81);Muscle  weakness (generalized) (M62.81)   Activity Tolerance Patient limited by fatigue;Patient limited by lethargy   Patient Left in bed;with call bell/phone within reach;with bed alarm set   Nurse Communication Mobility status        Time: 6433-2951 OT Time Calculation (min): 31 min  Charges: OT General Charges $OT Visit: 1 Visit OT Treatments $Self Care/Home Management : 8-22 mins  Lou Cal, OT Acute Rehabilitation Services Pager [phone] Office [phone]    Raymondo Band 12/13/2019, 1:19 PM

## 2019-12-13 NOTE — Progress Notes (Signed)
Pharmacy Antibiotic Note  Kevin Hart is a 74 y.o. male  with UTI.  Pharmacy has been consulted for cefepime dosing. Palliative care seeing -WBC= 15.6, tmax= 100.9 -urine cultures pending  Plan: -cefepime 1gm IV q8h -Will follow renal function, cultures and clinical progress   Height: 5\' 10"  (177.8 cm) Weight: 93 kg (205 lb) IBW/kg (Calculated) : 73  Temp (24hrs), Avg:99.9 F (37.7 C), Min:98.8 F (37.1 C), Max:100.9 F (38.3 C)  Recent Labs  Lab 12/07/19 0847 12/07/19 0847 12/08/19 0519 12/09/19 0407 12/10/19 0329 12/11/19 0330 12/13/19 1244  WBC 7.0  --  7.4 7.3  --   --  15.6*  CREATININE 0.99   < > 0.93 0.91 0.91 0.91 1.11   < > = values in this interval not displayed.    Estimated Creatinine Clearance: 65.9 mL/min (by C-G formula based on SCr of 1.11 mg/dL).    No Known Allergies   Thank you for allowing pharmacy to be a part of this patient's care.  Hildred Laser, PharmD Clinical Pharmacist **Pharmacist phone directory can now be found on [email or web address] ([email or web address]).  Listed under [place].

## 2019-12-13 NOTE — Progress Notes (Addendum)
Physical Therapy Treatment Patient Details Name: Kevin Hart MRN: [id number] DOB: 10-27-1945 Today's Date: 12/13/2019    History of Present Illness 74 yo male s/p fall with R cerebellum SDH SAH R side 2-4 rib fxs and Large R PTX with subcutaneous emphysema Pt iwth x3 falls in 3 weeks.  PMH OSA CAD DVT HTN arthritis bil cataracts CHF lupus MI 2015 s/p fall with SAH scoliosis    PT Comments    Pt more lethargic and less interactive today. Pt with significant R lateral head tilt and lean compared to yesterday. Pt with limited active initiation today and was unable to stand after being able to stand the last 2 days. Spoke with Trauma service regarding this regression and increased lethargy. Aware patient's family wants to take him on their annual beach trip which is why CIR is very important for education for family to decrease burden of care however unsure with patients increased lethargy if he will be able to tolerate it.   Acute PT to return tomorrow to re-assess pt mobility.    Follow Up Recommendations  CIR;Supervision/Assistance - 24 hour     Equipment Recommendations  Wheelchair (measurements PT);Wheelchair cushion (measurements PT);Hospital bed    Recommendations for Other Services Rehab consult     Precautions / Restrictions Precautions Precautions: Fall Precaution Comments: R lateral lean Restrictions Weight Bearing Restrictions: No    Mobility  Bed Mobility Overal bed mobility: Needs Assistance Bed Mobility: Sit to Supine;Rolling;Sidelying to Sit Rolling: Max assist Sidelying to sit: Max assist;+2 for physical assistance Supine to sit: Max assist;+2 for physical assistance;+2 for safety/equipment Sit to supine: Max assist;+2 for physical assistance;+2 for safety/equipment   General bed mobility comments: pt with minimal initiation of LE movement today, maxAx 2 for trunk elevation and LE management today  Transfers Overall transfer level: Needs  assistance Equipment used: Ambulation equipment used Transfers: Sit to/from Stand Sit to Stand: Max assist;Total assist;+2 safety/equipment         General transfer comment: x2 attempts to stand from EOB, pt having difficulty motor planning, pushing back with UEs vs pulling/pushing up; able to clear hips from EOB but unable to attain full upright position   Ambulation/Gait                 Stairs             Wheelchair Mobility    Modified Rankin (Stroke Patients Only) Modified Rankin (Stroke Patients Only) Pre-Morbid Rankin Score: No symptoms Modified Rankin: Severe disability     Balance Overall balance assessment: Needs assistance Sitting-balance support: Bilateral upper extremity supported;Feet supported Sitting balance-Leahy Scale: Poor Sitting balance - Comments: continues to present with R lateral lean and decreased initiation to self correct  Postural control: Right lateral lean Standing balance support: Bilateral upper extremity supported Standing balance-Leahy Scale: Zero Standing balance comment: dependent on physical assist, unable to achieve full standing today                            Cognition Arousal/Alertness: Awake/alert;Lethargic Behavior During Therapy: Flat affect Overall Cognitive Status: Impaired/Different from baseline Area of Impairment: Problem solving;Following commands                   Current Attention Level: Focused   Following Commands: Follows one step commands inconsistently;Follows one step commands with increased time Safety/Judgement: Decreased awareness of deficits;Decreased awareness of safety Awareness: Intellectual Problem Solving: Slow processing;Difficulty sequencing;Requires verbal cues;Requires tactile cues;Decreased  initiation General Comments: pt  more lethargic today, less engaging, minimal command following and further impaired comprehension      Exercises Other Exercises Other  Exercises: worked on trunk extension, cervical stretch/extension while seated EOB     General Comments General comments (skin integrity, edema, etc.): dependent for hygiene s/p small BM, VSS      Pertinent Vitals/Pain Pain Assessment: No/denies pain    Home Living                      Prior Function            PT Goals (current goals can now be found in the care plan section) Acute Rehab PT Goals Patient Stated Goal: to inmprove mobility and go to rehab Progress towards PT goals: Not progressing toward goals - comment (pt with regression in mobility)    Frequency    Min 4X/week      PT Plan Current plan remains appropriate    Co-evaluation PT/OT/SLP Co-Evaluation/Treatment: Yes Reason for Co-Treatment: Necessary to address cognition/behavior during functional activity PT goals addressed during session: Mobility/safety with mobility OT goals addressed during session: ADL's and self-care      AM-PAC PT "6 Clicks" Mobility   Outcome Measure  Help needed turning from your back to your side while in a flat bed without using bedrails?: A Lot Help needed moving from lying on your back to sitting on the side of a flat bed without using bedrails?: A Lot Help needed moving to and from a bed to a chair (including a wheelchair)?: Total Help needed standing up from a chair using your arms (e.g., wheelchair or bedside chair)?: Total Help needed to walk in hospital room?: Total Help needed climbing 3-5 steps with a railing? : Total 6 Click Score: 8    End of Session Equipment Utilized During Treatment: Gait belt Activity Tolerance: Patient limited by fatigue;Patient limited by lethargy Patient left: in chair;with call bell/phone within reach;with chair alarm set Nurse Communication: Mobility status PT Visit Diagnosis: Unsteadiness on feet (R26.81)     Time: 4403-4742 PT Time Calculation (min) (ACUTE ONLY): 28 min  Charges:  $Therapeutic Activity: 8-22 mins                      Kittie Plater, PT, DPT Acute Rehabilitation Services Pager #: [phone] Office #: [phone]    Berline Lopes 12/13/2019, 2:27 PM

## 2019-12-13 NOTE — Progress Notes (Signed)
SLP Cancellation Note  Patient Details Name: SYAIRE SABER MRN: [id number] DOB: May 21, 1945   Cancelled treatment:       Reason Eval/Treat Not Completed: Fatigue/lethargy limiting ability to participate. Per nursing staff, pt is not sufficiently alert this afternoon. UA indicative of UTI, for which he has been started on antibiotics. They suggest attempting on another date. Will continue to follow.    Osie Bond., M.A. [place] Acute Rehabilitation Services Pager [phone] Office [phone]  12/13/2019, 3:16 PM

## 2019-12-13 NOTE — Care Management Important Message (Signed)
Important Message  Patient Details  Name: Kevin Hart MRN: [id number] Date of Birth: 07-26-1945   Medicare Important Message Given:  Yes     Orbie Pyo 12/13/2019, 1:56 PM

## 2019-12-13 NOTE — Progress Notes (Addendum)
Subjective: CC: Patient noted to be more lethargic today. He is A&O x 4 but very soft spoken. Follows commands but slower to respond. He denies Denies HA, focal weakness, CP, SOB, abdominal pain, palpitations, weakness, difficulty talking, N/V.  Objective: Vital signs in last 24 hours: Temp:  [98.1 F (36.7 C)-100.3 F (37.9 C)] 99.8 F (37.7 C) (09/22 0800) Pulse Rate:  [74-121] 107 (09/22 0800) Resp:  [16-20] 19 (09/22 0800) BP: (92-120)/(50-87) 103/80 (09/22 0800) SpO2:  [92 %-98 %] 93 % (09/22 0800) Last BM Date: 12/12/19  Intake/Output from previous day: 09/21 0701 - 09/22 0700 In: -  Out: 450 [Urine:450] Intake/Output this shift: Total I/O In: 240 [P.O.:240] Out: -   PE: Gen:  Alert, NAD, pleasant Card:  Tachycardic with regular rhythm. Frequent PVC's on monitor. HR 100-110 on monitor.  Distal pulses palpable throughout  Pulm:  CTAB, no W/R/R, effort normal. On RA Abd: Soft, NT/ND, +BS Ext:  No LE edema or calf tenderness Psych: A&Ox4 Skin: no rashes noted, warm and dry Neuro:  Mental Status: Appears lethargic compared to my prior visits. He is oriented x 4. No aphasia. Soft spoken.  Able to follow 1 step commands Cranial Nerves:  II:  Pupils equal, round, reactive to light. No afferent pupillary defect III,IV, VI: ptosis not present, he displays minimal movement but intact extra-ocular motions in all directions bilaterally  V,VII: smile symmetric, eyebrows raise symmetric, facial light touch sensation equal VIII: did not assess X: uvula elevates symmetrically  XI: patient did not participate  XII: midline tongue extension without fassiculations Motor:  Normal tone. Patient with intact grip strength b/l that appears equal. Does no participate with biceps flexion or triceps extension b/l. Able dorsiflexion/plantar flexion b/l. Patient did not participate in bending at the knee or lifting leg off the bed for either LE's Sensory: Reports decreased sensation  to RUE and RLE compared to LUE and LLE. Did not test Romberg.  Cerebellar: Patient unable to follow commands to participate in finger-to-nose. heel-to-shin balance or pronator drift test Gait: Did not assess   Lab Results:  No results for input(s): WBC, HGB, HCT, PLT in the last 72 hours. BMET Recent Labs    12/11/19 0330  NA 144  K 4.7  CL 112*  CO2 20*  GLUCOSE 122*  BUN 17  CREATININE 0.91  CALCIUM 9.9   PT/INR No results for input(s): LABPROT, INR in the last 72 hours. CMP     Component Value Date/Time   NA 144 12/11/2019 0330   NA 138 11/09/2017 1618   K 4.7 12/11/2019 0330   CL 112 (H) 12/11/2019 0330   CO2 20 (L) 12/11/2019 0330   GLUCOSE 122 (H) 12/11/2019 0330   BUN 17 12/11/2019 0330   BUN 21 11/09/2017 1618   CREATININE 0.91 12/11/2019 0330   CREATININE 1.09 10/17/2015 1154   CALCIUM 9.9 12/11/2019 0330   PROT 6.8 12/03/2019 1036   PROT 7.0 09/14/2018 0839   ALBUMIN 3.4 (L) 12/03/2019 1036   ALBUMIN 4.6 09/14/2018 0839   AST 18 12/03/2019 1036   ALT 12 12/03/2019 1036   ALKPHOS 32 (L) 12/03/2019 1036   BILITOT 1.5 (H) 12/03/2019 1036   BILITOT 0.6 09/14/2018 0839   GFRNONAA >60 12/11/2019 0330   GFRAA >60 12/11/2019 0330   Lipase     Component Value Date/Time   LIPASE 40 10/25/2017 2349       Studies/Results: No results found.  Anti-infectives: Anti-infectives (From admission, onward)  None       Assessment/Plan GLF, mechanical SDH/SAH- Per Dr. Christella Noa of [place]. Coumadin reversed w/ K Centra (9/12). Repeat CTH 9/13with stable SDH, slightly increased SAH.Repeat [facility] 9/14 PM w/ hemorrhage of the right tentorial leaflet w/ mass effect that is stable + scattered foci of SAH.Keppra is home med, restarted. 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 Appreciate recommendations, plan for outpatient neurology follow up. See below. Repeat CTH today for below workup.    Large right side PTX with subcutaneous emphysema- Pneumothorax resolved, chest tube removed.  Right sided 2-4 rib fractures- Multimodal pain control. Pulm toilet. PT/OT HTN- Home Entresto, Allopurinol and propanolol (at half dose).   Hx ofCHF-Echo 9/13 w/EF30-35%, severe akinesis, aneurysmal apex.There was an inferoapical filling defect felt to less to be a thrombus.Cards consulted and felt this is likely artifact/shadowing.Conttelemetry. No evidence of fluid overload at this time.  OSA- continue home CPAP Hx CAD(prior LHC 07/2019)- cards following H/orecurrentDVT(+ Lupus anticoagulant)- Coumadin reversed w/ K Centra (9/12). INR 1.4this AM. Holding Coumadin. Daily INR and CBC. NSGY recommending no anticoagulation in the future with frequent falls (3 falls in the last 3 weeks).Vascular and hematology has seen the patient. No indication for IVC filter at this time. Infrarenal AAA -Per Vascular. They recommend follow up US in 1 year Multinodular Thyroid - Will need outpatient thyroid US Urinary retention, foley in place-failed voiding trial 9/15, foley replaced. On flomax. Only 0.44ml/kg/hr in the last 24 hours.   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 NT on exam.  AMS - I asked Dr. Bobbye Morton to eval with me. Will get CTH, CXR, EKG, ABG, CBC, BMP, Ammonia, UA, and Blood Cultures to evaluate. New meds yesterday include home Allopurinol, Propanolol, prophylactic Protonix and Lovenox. Also Flomax. Will review with Pharmacy  [place]. Home PPI. Bowel regimen. VTE -SCDs, LMWH Dispo - AMS workup. Appreciate Palliative's input - they are recommending CIR. CIR following and has approval when medically cleared.    LOS: 10 days    Kevin Hart , [facility] 12/13/2019, 11:36 AM Please see Amion for pager number during day hours 7:00am-4:30pm

## 2019-12-13 NOTE — Progress Notes (Addendum)
Inpatient Rehabilitation Admissions Coordinator  I contacted Legrand [place], [place] to request Trauma service contact Dr. Amalia Hailey at [facility] for peer to peer as soon as able to obtain determination for the CIR request. I have discussed with Dr. Amalia Hailey and Dr. Hilma Favors CIR request. I will follow up today after peer to peer completed.  Danne Baxter, RN, MSN Rehab Admissions Coordinator [phone] 12/13/2019 8:39 AM  Patient has been approved for Cir per [employer]. I await medical clearance for admission. Discussed with Trauma.   Danne Baxter, RN, MSN Rehab Admissions Coordinator [phone] 12/13/2019 11:30 AM

## 2019-12-14 ENCOUNTER — Ambulatory Visit: Payer: PPO | Admitting: Physical Therapy

## 2019-12-14 MED ORDER — ORAL CARE MOUTH RINSE
15.0000 mL | Freq: Two times a day (BID) | OROMUCOSAL | Status: DC
  Administered 2019-12-14 – 2019-12-15 (×2): 15 mL via OROMUCOSAL

## 2019-12-14 MED ORDER — CHLORHEXIDINE GLUCONATE 0.12 % MT SOLN
15.0000 mL | Freq: Two times a day (BID) | OROMUCOSAL | Status: DC
  Administered 2019-12-14 – 2019-12-15 (×3): 15 mL via OROMUCOSAL
  Filled 2019-12-14: qty 15

## 2019-12-14 NOTE — Progress Notes (Signed)
   12/14/19 0813  Vitals  Temp 99.2 F (37.3 C)  Temp Source Oral  BP (!) 110/53  MAP (mmHg) 67  BP Location Left Arm  BP Method Automatic  Patient Position (if appropriate) Lying  Pulse Rate (!) 106  Pulse Rate Source Monitor  MEWS COLOR  MEWS Score Color Comfort Care Only  Oxygen Therapy  SpO2 93 %  O2 Device Room Air   RN observed Pt resting comfortably. VS performed. Pt's family at bedside.

## 2019-12-14 NOTE — Progress Notes (Signed)
Palliative Care  Spoke with patient's son by phone by request.I discussed anticipated trajectory of illness and answered his questions. Family at peace with decision to transition to comfort measures only.Orders placed to have ICD deactivated.  Lane Hacker, DO Palliative Medicine

## 2019-12-14 NOTE — Progress Notes (Signed)
Inpatient Rehabilitation Admissions Coordinator  Noted palliative discussions. I will follow.  Danne Baxter, RN, MSN Rehab Admissions Coordinator [phone] 12/14/2019 8:20 AM

## 2019-12-14 NOTE — Progress Notes (Signed)
Inpatient Rehabilitation Admissions Coordinator  Noted transition to Comfort care.  Danne Baxter, RN, MSN Rehab Admissions Coordinator [phone] 12/14/2019 12:06 PM

## 2019-12-14 NOTE — TOC Initial Note (Addendum)
Transition of Care [facility]) - Initial/Assessment Note    Patient Details  Name: Kevin Hart MRN: [id number] Date of Birth: 01-03-1946  [facility]) CM/SW Contact:    Ella Bodo, RN Phone Number: 12/14/2019, 4:46 PM  Clinical Narrative:  74 yo male s/p fall with R cerebellum SDH/ SAH R side 2-4 rib fxs and Large R PTX with subcutaneous emphysema Pt with 3 falls in 3 weeks.   Prior to admission patient independent with assistive devices and living at home with his spouse Thayer Headings, at bedside.  Patient with unexpected decline over the last 24 hours, and family has chosen to offer comfort measures.  They prefer patient be transferred to residential hospice facility, and wife chooses [facility] home at [facility].  Referral called into Webb Silversmith with [facility] [place].  Currently no beds available, admissions liaison will notify this case manager upon bed availability.  Wife updated on current bed situation.                Expected Discharge Plan: [place] Barriers to Discharge: Continued Medical Work up   Patient Goals and CMS Choice        Expected Discharge Plan and Services Expected Discharge Plan: [place]   Discharge Planning Services: CM Consult   Living arrangements for the past 2 months: Single Family Home                                      Prior Living Arrangements/Services Living arrangements for the past 2 months: Single Family Home Lives with:: Spouse Patient language and need for interpreter reviewed:: Yes        Need for Family Participation in Patient Care: Yes (Comment) Care giver support system in place?: Yes (comment)   Criminal Activity/Legal Involvement Pertinent to Current Situation/Hospitalization: No - Comment as needed  Activities of Daily Living Home Assistive Devices/Equipment: Cane (specify quad or straight), Walker (specify type) ADL Screening (condition at time of  admission) Patient's cognitive ability adequate to safely complete daily activities?: Yes Is the patient deaf or have difficulty hearing?: No Does the patient have difficulty seeing, even when wearing glasses/contacts?: No Does the patient have difficulty concentrating, remembering, or making decisions?: No Patient able to express need for assistance with ADLs?: Yes Does the patient have difficulty dressing or bathing?: Yes Independently performs ADLs?: No Communication: Independent Dressing (OT): Needs assistance Is this a change from baseline?: Change from baseline, expected to last >3 days Grooming: Needs assistance Is this a change from baseline?: Change from baseline, expected to last >3 days Feeding: Independent Bathing: Needs assistance Is this a change from baseline?: Change from baseline, expected to last >3 days Toileting: Needs assistance Is this a change from baseline?: Change from baseline, expected to last >3days In/Out Bed: Needs assistance Is this a change from baseline?: Change from baseline, expected to last >3 days Walks in Home: Needs assistance Is this a change from baseline?: Pre-admission baseline Does the patient have difficulty walking or climbing stairs?: Yes Weakness of Legs: Both Weakness of Arms/Hands: Left  Permission Sought/Granted                  Emotional Assessment Appearance:: Appears stated age Attitude/Demeanor/Rapport: Unresponsive Affect (typically observed): Unable to Assess        Admission diagnosis:  Tension pneumothorax [J93.0] Urinary retention [R33.9] Subdural hematoma (HCC) [S06.5X9A] SOB (shortness  of breath) [R06.02] Pneumothorax [J93.9] Patient Active Problem List   Diagnosis Date Noted  . Tension pneumothorax 12/03/2019  . Subdural hematoma ([place]) 10/12/2019  . Hypokalemia   . OSA on CPAP   . Chest pain 08/11/2019  . Precordial chest pain   . Unstable angina ([place]) 07/26/2018  . BPH with urinary obstruction  02/24/2018  . Renal hemorrhage, right 10/26/2017  . S/P left rotator cuff repair 03/05/2016  . Anterior dislocation of left shoulder 11/29/2015  . History of implantable cardioverter-defibrillator (ICD) placement 03/16/2015  . ICD (implantable cardioverter-defibrillator) in place   . Chronic systolic heart failure ([place]) 12/31/2014  . Chronic systolic dysfunction of left ventricle 11/27/2014  . S/P drug eluting coronary stent placement, mLAD 07/06/14, Promus 07/07/2014  . At risk for sudden cardiac death, EF 25-30% with PVCs, with lifevest 07/07/2014  . Coronary artery disease involving native coronary artery of native heart with unstable angina pectoris ([place])   . Cardiomyopathy, ischemic   . History of DVT (deep vein thrombosis), on coumadin   . NSTEMI (non-ST elevated myocardial infarction) ([place]) 07/05/2014  . CAD (coronary artery disease)   . Recurrent acute deep vein thrombosis (DVT) of lower extremity ([place])   . Dyslipidemia   . GERD (gastroesophageal reflux disease)   . Anterior myocardial infarction ([place])   . Lupus anticoagulant positive    PCP:  [facility], [place]:   [place], [address] [place] [address] Phone: [phone] Fax: [place], [place] - [address] [place] [address] Phone: [phone] Fax: [phone]  [facility] [place], [place] - [address] [place] [address] Phone: [phone] Fax: [phone]     Social Determinants of Health (SDOH) Interventions    Readmission Risk Interventions No flowsheet data found.  Reinaldo Raddle, RN, BSN  Trauma/Neuro ICU Case Manager [phone]

## 2019-12-14 NOTE — Progress Notes (Signed)
Patient ID: Kevin Hart, male   DOB: Feb 16, 1946, 74 y.o.   MRN: [id number]     Subjective: No complaint  ROS negative except as listed above. Objective: Vital signs in last 24 hours: Temp:  [98.8 F (37.1 C)-100.9 F (38.3 C)] 99.2 F (37.3 C) (09/23 0813) Pulse Rate:  [99-106] 106 (09/23 0813) Resp:  [14-26] 14 (09/23 0420) BP: (90-116)/(52-70) 110/53 (09/23 0813) SpO2:  [92 %-98 %] 93 % (09/23 0813) Last BM Date: 12/12/19  Intake/Output from previous day: 09/22 0701 - 09/23 0700 In: 704.5 [P.O.:340; I.V.:234.3; IV Piggyback:130.2] Out: 850 [Urine:850] Intake/Output this shift: No intake/output data recorded.  General appearance: cooperative Resp: clear to auscultation bilaterally Cardio: irregularly irregular rhythm GI: soft, non-tender; bowel sounds normal; no masses,  no organomegaly Extremities: calves soft Neurologic: Mental status: alert, disoriented, F/C  Lab Results: CBC  Recent Labs    12/13/19 1244  WBC 15.6*  HGB 13.3  HCT 41.6  PLT 210   BMET Recent Labs    12/13/19 1244  NA 139  K 4.1  CL 102  CO2 27  GLUCOSE 146*  BUN 17  CREATININE 1.11  CALCIUM 9.8   PT/INR No results for input(s): LABPROT, INR in the last 72 hours. ABG Recent Labs    12/13/19 1200  PHART 7.469*  HCO3 26.5    Studies/Results: CT HEAD WO CONTRAST  Result Date: 12/13/2019 CLINICAL DATA:  74 year old male status post fall 3 weeks ago. Intracranial hemorrhage. Delirium, awake but unresponsive. EXAM: CT HEAD WITHOUT CONTRAST TECHNIQUE: Contiguous axial images were obtained from the base of the skull through the vertex without intravenous contrast. COMPARISON:  Head CT 12/05/2019 and earlier. FINDINGS: Brain: Hemorrhage along the undersurface of the right tentorium and involving the right superior cerebellum is becoming isodense (series 3, image 11 and series 5, image 60) and decreasing in volume. Associated mild superior cerebellar mass effect appears stable to  decreased. Basilar cisterns are patent. No significant ventriculomegaly. No transependymal edema. No supratentorial mass effect. No residual supratentorial subarachnoid blood identified today. No new intracranial hemorrhage identified. Stable gray-white matter differentiation throughout the brain. No cortically based acute infarct identified. Vascular: Calcified atherosclerosis at the skull base. Chronic intracranial artery tortuosity. 6 No suspicious intracranial vascular hyperdensity. Skull: No acute osseous abnormality identified. Sinuses/Orbits: Visualized paranasal sinuses and mastoids are clear. Other: Resolved subcutaneous gas in the visible face. Negative orbit and scalp soft tissues. IMPRESSION: 1. Hemorrhage along the undersurface of the right tentorium and at the right superior cerebellum is fading to iso-density and decreasing in volume. 2. Resolved supratentorial subarachnoid hemorrhage. No new intracranial blood products. 3. No significant intracranial mass effect. No new intracranial abnormality. Electronically Signed   By: Genevie Ann M.D.   On: 12/13/2019 12:39   DG Chest Port 1 View  Result Date: 12/13/2019 CLINICAL DATA:  Shortness of breath EXAM: PORTABLE CHEST 1 VIEW COMPARISON:  12/07/2019 FINDINGS: Left-sided implanted cardiac device, unchanged. Stable heart size. Coronary stent noted. Patient's chin obscures the right lung apex. No pneumothorax is identified. Mild right basilar atelectasis. Subcutaneous emphysema at the right chest wall and neck has improved from prior. IMPRESSION: 1. No pneumothorax is identified within the limitations of this exam, as patient's chin obscures the right lung apex. 2. Mild right basilar atelectasis. Electronically Signed   By: Davina Poke D.O.   On: 12/13/2019 12:13    Anti-infectives: Anti-infectives (From admission, onward)   Start     Dose/Rate Route Frequency Ordered Stop   12/13/19 2300  ceFEPIme (MAXIPIME) 1 g in sodium chloride 0.9 % 100 mL  IVPB  Status:  Discontinued        1 g 200 mL/hr over 30 Minutes Intravenous Every 8 hours 12/13/19 2206 12/13/19 2229   12/13/19 1445  cefTRIAXone (ROCEPHIN) 1 g in sodium chloride 0.9 % 100 mL IVPB  Status:  Discontinued        1 g 200 mL/hr over 30 Minutes Intravenous Every 24 hours 12/13/19 1353 12/13/19 1652      Assessment/Plan: GLF, mechanical SDH/SAH- Per Dr. Christella Noa of [place]. Coumadin reversed w/ K Centra (9/12). Repeat CTH 9/13with stable SDH, slightly increased SAH.Repeat [facility] 9/14 PM w/ hemorrhage of the right tentorial leaflet w/ mass effect that is stable + scattered foci of SAH.Keppra is home med, restarted. 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 Large right side PTX with subcutaneous emphysema- Pneumothorax resolved, chest tube removed.  Right sided 2-4 rib fractures- Multimodal pain control. Pulm toilet. PT/OT HTN- Home Entresto, Allopurinol and propanolol (at half dose).   Hx ofCHF-Echo 9/13 w/EF30-35%, severe akinesis, aneurysmal apex.There was an inferoapical filling defect felt to less to be a thrombus.Cards consulted and felt this is likely artifact/shadowing.Conttelemetry. No evidence of fluid overload at this time.  OSA- continue home CPAP Hx CAD(prior LHC 07/2019)- cards following H/orecurrentDVT(+ Lupus anticoagulant)- Coumadin reversed w/ K Centra (9/12). INR 1.4this AM. Holding Coumadin. Daily INR and CBC. NSGY recommending no anticoagulation in the future with frequent falls (3 falls in the last 3 weeks).Vascular and hematology has seen the patient. No indication for IVC filter at this time. Infrarenal AAA -Per Vascular. They recommend follow up US in 1 year Multinodular Thyroid - Will need outpatient thyroid US Urinary retention, foley in place-failed voiding trial 9/15, foley replaced. On flomax. Only 0.14ml/kg/hr in the last 24 hours.   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 NT on exam.  AMS - eval done 9/22 but now family has transitioned to comfort care [place]. Home PPI. Bowel regimen. VTE -SCDs, LMWH Dispo - Appreciate Palliative F/U. Family has transitioned to comfort care this AM. On morphine drip. DNR.   LOS: 11 days    Georganna Skeans, MD, MPH, FACS Trauma & General Surgery Use [email or web address] to contact on call provider  12/14/2019

## 2019-12-14 NOTE — Progress Notes (Signed)
Representative with [employer] at bedside to turn off Pt's ICD per MD order.

## 2019-12-15 LAB — URINE CULTURE: Culture: >=100000 — AB

## 2019-12-15 MED ORDER — GLYCOPYRROLATE 0.2 MG/ML IJ SOLN: 0.4000 mg | INTRAMUSCULAR | Status: DC | PRN

## 2019-12-15 MED ORDER — MORPHINE BOLUS VIA INFUSION
2.0000 mg | INTRAVENOUS | Status: DC | PRN
  Filled 2019-12-15: qty 2

## 2019-12-15 NOTE — Progress Notes (Signed)
Subjective: CC: Patient resting comfortably. Patients daughter at bedside. No questions at this time.   Objective: Vital signs in last 24 hours: Temp:  [99.7 F (37.6 C)-100 F (37.8 C)] 100 F (37.8 C) (09/24 0624) Pulse Rate:  [102-119] 119 (09/24 0624) Resp:  [14-17] 17 (09/24 0624) BP: (121-124)/(62-67) 124/67 (09/24 0624) SpO2:  [93 %-96 %] 93 % (09/24 0624) Last BM Date: 12/12/19  Intake/Output from previous day: 09/23 0701 - 09/24 0700 In: 360 [P.O.:360] Out: 850 [Urine:850] Intake/Output this shift: No intake/output data recorded.  PE: Gen:  Resting comfortably Card:  Tachycardia  Pulm:  CTAB, no W/R/R, effort normal Abd: Soft, NT/ND, +BS  Lab Results:  Recent Labs    12/13/19 1244  WBC 15.6*  HGB 13.3  HCT 41.6  PLT 210   BMET Recent Labs    12/13/19 1244  NA 139  K 4.1  CL 102  CO2 27  GLUCOSE 146*  BUN 17  CREATININE 1.11  CALCIUM 9.8   PT/INR No results for input(s): LABPROT, INR in the last 72 hours. CMP     Component Value Date/Time   NA 139 12/13/2019 1244   NA 138 11/09/2017 1618   K 4.1 12/13/2019 1244   CL 102 12/13/2019 1244   CO2 27 12/13/2019 1244   GLUCOSE 146 (H) 12/13/2019 1244   BUN 17 12/13/2019 1244   BUN 21 11/09/2017 1618   CREATININE 1.11 12/13/2019 1244   CREATININE 1.09 10/17/2015 1154   CALCIUM 9.8 12/13/2019 1244   PROT 6.8 12/03/2019 1036   PROT 7.0 09/14/2018 0839   ALBUMIN 3.4 (L) 12/03/2019 1036   ALBUMIN 4.6 09/14/2018 0839   AST 18 12/03/2019 1036   ALT 12 12/03/2019 1036   ALKPHOS 32 (L) 12/03/2019 1036   BILITOT 1.5 (H) 12/03/2019 1036   BILITOT 0.6 09/14/2018 0839   GFRNONAA >60 12/13/2019 1244   GFRAA >60 12/13/2019 1244   Lipase     Component Value Date/Time   LIPASE 40 10/25/2017 2349       Studies/Results: CT HEAD WO CONTRAST  Result Date: 12/13/2019 CLINICAL DATA:  74 year old male status post fall 3 weeks ago. Intracranial hemorrhage. Delirium, awake but unresponsive.  EXAM: CT HEAD WITHOUT CONTRAST TECHNIQUE: Contiguous axial images were obtained from the base of the skull through the vertex without intravenous contrast. COMPARISON:  Head CT 12/05/2019 and earlier. FINDINGS: Brain: Hemorrhage along the undersurface of the right tentorium and involving the right superior cerebellum is becoming isodense (series 3, image 11 and series 5, image 60) and decreasing in volume. Associated mild superior cerebellar mass effect appears stable to decreased. Basilar cisterns are patent. No significant ventriculomegaly. No transependymal edema. No supratentorial mass effect. No residual supratentorial subarachnoid blood identified today. No new intracranial hemorrhage identified. Stable gray-white matter differentiation throughout the brain. No cortically based acute infarct identified. Vascular: Calcified atherosclerosis at the skull base. Chronic intracranial artery tortuosity. 6 No suspicious intracranial vascular hyperdensity. Skull: No acute osseous abnormality identified. Sinuses/Orbits: Visualized paranasal sinuses and mastoids are clear. Other: Resolved subcutaneous gas in the visible face. Negative orbit and scalp soft tissues. IMPRESSION: 1. Hemorrhage along the undersurface of the right tentorium and at the right superior cerebellum is fading to iso-density and decreasing in volume. 2. Resolved supratentorial subarachnoid hemorrhage. No new intracranial blood products. 3. No significant intracranial mass effect. No new intracranial abnormality. Electronically Signed   By: Genevie Ann M.D.   On: 12/13/2019 12:39   DG Chest  Port 1 View  Result Date: 12/13/2019 CLINICAL DATA:  Shortness of breath EXAM: PORTABLE CHEST 1 VIEW COMPARISON:  12/07/2019 FINDINGS: Left-sided implanted cardiac device, unchanged. Stable heart size. Coronary stent noted. Patient's chin obscures the right lung apex. No pneumothorax is identified. Mild right basilar atelectasis. Subcutaneous emphysema at the right  chest wall and neck has improved from prior. IMPRESSION: 1. No pneumothorax is identified within the limitations of this exam, as patient's chin obscures the right lung apex. 2. Mild right basilar atelectasis. Electronically Signed   By: Davina Poke D.O.   On: 12/13/2019 12:13    Anti-infectives: Anti-infectives (From admission, onward)   Start     Dose/Rate Route Frequency Ordered Stop   12/13/19 2300  ceFEPIme (MAXIPIME) 1 g in sodium chloride 0.9 % 100 mL IVPB  Status:  Discontinued        1 g 200 mL/hr over 30 Minutes Intravenous Every 8 hours 12/13/19 2206 12/13/19 2229   12/13/19 1445  cefTRIAXone (ROCEPHIN) 1 g in sodium chloride 0.9 % 100 mL IVPB  Status:  Discontinued        1 g 200 mL/hr over 30 Minutes Intravenous Every 24 hours 12/13/19 1353 12/13/19 1652       Assessment/Plan GLF, mechanical SDH/SAH- Per Dr. Christella Noa of [place]. Coumadin reversed w/ K Centra (9/12). Repeat CTH 9/13with stable SDH, slightly increased SAH.Repeat [facility] 9/14 PM w/ hemorrhage of the right tentorial leaflet w/ mass effect that is stable + scattered foci of SAH.[facility] home med, restarted. 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 Large right side PTX with subcutaneous emphysema- Pneumothorax resolved, chest tube removed.  Right sided 2-4 rib fractures- Multimodal pain control. Pulm toilet. PT/OT HTN- Home Entresto, Allopurinol and propanolol (at half dose). Hx ofCHF-Echo 9/13 w/EF30-35%, severe akinesis, aneurysmal apex.There was an inferoapical filling defect felt to less to be a thrombus.Cards consulted and felt this is likely artifact/shadowing.  OSA- continue home CPAP Hx CAD(prior LHC 07/2019)- cards following H/orecurrentDVT(+ Lupus anticoagulant)- Coumadin reversed w/ K Centra (9/12).  NSGY recommend no anticoagulation in the future with frequent falls (3 falls in the last 3 weeks).Vascular and  hematology has seen the patient. No indication for IVC filter at this time. Infrarenal AAA -Per Vascular. They recommended follow up US in 1 year Multinodular Thyroid - Will need outpatient thyroid US Urinary retention, foley in place-failed voiding trial 9/15, foley replaced. On flomax. 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 NT on exam.  AMS - eval done 9/22 but now family has transitioned to comfort care [place]. Home PPI. Bowel regimen. VTE -SCDs Dispo - Appreciate Palliative care. Family has transitioned to comfort care . On morphine drip. DNR. Awaiting bed availability at hospice facility.    LOS: 12 days    Jillyn Ledger , [facility] 12/15/2019, 9:40 AM Please see Amion for pager number during day hours 7:00am-4:30pm

## 2019-12-15 NOTE — Progress Notes (Signed)
[facility]  Discussed with pt's wife after review of pt and she does want to proceed and confirm that she is intrested in hospice referral and transfer.  Offered bed and she accepted. Met with Thayer Headings at 1015am to do paperwork. Spoke to the pt's CM Almyra Free who will arrange ambulance around 1200N to have pt picked up. Webb Silversmith RN [phone]

## 2019-12-15 NOTE — Progress Notes (Signed)
Palliative Care Progress Note  Mr. Nawrot is comfortable. He is having prolonged periods of apnea. Family is at bedside. He is on a low dose morphine infusion and has not required additional dose titration. He has periods of resting tremor. Per family he was able to wake up earlier today and communicate with them briefly. At the time of my visit he was unresponsive. I discussed the progression of EOL and provided information on what to expect. I answered their questions about his condition-the nature of his illness and provided decision making support.  His son and daughter shared with me that their mother died at 109 yo with breast cancer and that they want their dad to be comfortable and have a peaceful death. Their grief was appropriate.   Will maintain current comfort measures and monitor frequently for changes.  Referral to [place]. Currently no beds are available.   Lane Hacker, DO Palliative Medicine [phone]  Time: 35 min Greater than 50%  of this time was spent counseling and coordinating care related to the above assessment and plan.

## 2019-12-15 NOTE — Progress Notes (Signed)
Nutrition Brief Note  Chart reviewed. Pt now transitioning to comfort care.  No further nutrition interventions warranted at this time.  Please re-consult as needed.   Leny Morozov W, RD, LDN, CDCES Registered Dietitian II Certified Diabetes Care and Education Specialist Please refer to AMION for RD and/or RD on-call/weekend/after hours pager  

## 2019-12-15 NOTE — Progress Notes (Signed)
Pt discharged to hospice this pm

## 2019-12-15 NOTE — Discharge Summary (Signed)
Patient ID: Kevin Hart [id number] 02-26-1946 74 y.o.  Admit date: 12/03/2019 Discharge date: 12/15/2019  Admitting Diagnosis: Right cerebellum subdural Subarhachnoid hemorrhage Large right side PTX with subcutaneous emphysema Right sided 2-4 rib fractures   PMH of OSA, CAD (prior LHC 07/2019), prior DVT (on coumadin), HTN that presents after a mechanical fall from standing 5 days ago.  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 male with PMH of OSA, CAD (prior LHC 07/2019), prior DVT (on coumadin), HTN that presents after a fall 5 days ago. Patient reports he can't remember how he fell, but most likely tripped while he was walking. He does not remember hitting his head. Of note, he has fallen x3 in the last 3 weeks. Of note, patient lives in two story home and uses walker for assistance with ambulation. He has been attending outpatient rehab.  He went to ED earlier today due to urinary retention and feeling "off balance". Work up there included CT head, CAP, CTA head/neck, which were significant for the following injuries:   Right cerebellum subdural Subarhachnoid hemorrhage Large right side PTX with subcutaneous emphysema Right sided 2-4 rib fractures    Labs significant for INR 3.5, he was given [place]. Right sided pigtail was placed prior to transfer. On arrival, GCS 15, O2 sats in low 90s on RA. Having significant right sided chest wall tenderness.  Procedures None   Hospital Course:  GLF, mechanical  SDH/SAH- NSGY consulted - Dr. Christella Noa. Coumadin reversed w/ K  Centra (9/12). INR decreased appropriately. NSGY recommended no acute operative intervention. Patient was treated with Keppra which was a home med. Repeat [facility] 9/13with stable SDH, slightly increased SAH.Repeat [facility] 9/14 PM w/ hemorrhage of the right tentorial leaflet w/ mass effect that is stable + scattered foci of SAH.Patient worked with TBI therapies.family did have concerns for possible parkinson's. Neurology was consulted and suspect underlying parkinsonism but do not recommend Sinemet in setting of TBI.  Large right side PTX with subcutaneous emphysema- CT was placed. Serial chest xrays were monitored and once chest output decreased and pneumothorax improved the chest tube was removed.   Right sided 2-4 rib fractures- This was treated with multimodal pain control, pulm toilet.   Hx HTN- Home Entresto, Allopurinol and propanolol (at half dose) were given during hospitalization.  Hx ofCHF-Echo 9/13 w/EF30-35%, severe akinesis, aneurysmal apex.There was an inferoapical filling defect felt to less to be a thrombus.Cards consulted and felt this is likely artifact/shadowing.   OSA- Home CPAP  Hx CAD(prior LHC 07/2019)  H/orecurrentDVT(+ Lupus anticoagulant)- Coumadin reversed w/ K Centra (9/12).  NSGY recommend no anticoagulation in the future with frequent falls (3 falls in the last 3 weeks).Vascular was consulted for possible IVC. Hematology was consulted as well given patients Lupus anticoagulant. They concluded there was no indication for IVC filter at this time.  Infrarenal AAA -Found incidentally. Vascular consulted. They recommended follow up US in 1 year  Multinodular Thyroid - Found incidentally. Would need thyroid US as outpatient. However patient moved to hospice at discharge  Urinary retention - failed voiding trial. 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 NT on exam.   AMS - palliative followed patient during  admission per family request.  Initially palliative recommended CIR as recommended by therapies.  Patient had a change in mental status on 9/22.  Family quested another meeting with palliative admitted decision to transition to comfort care.  Ultimately the patient was discharged to hospice facility per family request.   Allergies as of 12/15/2019   No Known Allergies     Medication List    STOP taking these medications   aspirin 81 MG chewable tablet   warfarin 5 MG tablet Commonly known as: Coumadin     TAKE these medications   allopurinol 300 MG tablet Commonly known as: ZYLOPRIM Take 150 mg by mouth 2 (two) times daily.   ascorbic acid 500 MG tablet Commonly known as: VITAMIN C Take 500 mg by mouth daily.   calcium-vitamin D 500-200 MG-UNIT tablet Commonly known as: OSCAL WITH D Take 1 tablet by mouth.   Entresto 24-26 MG Generic drug: sacubitril-valsartan Take 1 tablet by mouth daily.   ezetimibe 10 MG tablet Commonly known as: ZETIA Take 1 tablet (10 mg total) by mouth daily.   furosemide 20 MG tablet Commonly known as: LASIX Take 1 tablet (20 mg total) by mouth daily.   levETIRAcetam 500 MG tablet Commonly known as: KEPPRA Take 1 tablet (500 mg total) by mouth 2 (two) times daily.   Livalo 4 MG Tabs Generic drug: Pitavastatin Calcium Take 4 mg by mouth at bedtime.   nitroGLYCERIN 0.4 MG SL tablet Commonly known as: NITROSTAT Place 1 tablet (0.4 mg total) under the tongue every 5 (five) minutes x 3 doses as needed for chest pain.   ONE-A-DAY MENS PO Take 1 tablet by mouth daily.   pantoprazole 40 MG tablet Commonly known as: PROTONIX Take 40 mg by mouth daily.   potassium chloride SA 20 MEQ tablet Commonly known as: KLOR-CON Take 20 mEq by mouth daily.   propranolol 20 MG tablet Commonly known as: INDERAL Take 20 mg by mouth 2 (two) times daily.         [facility] Follow up.   Contact  information: [place] [address] [phone]        Martinique, Peter M, MD .   Specialty: Cardiology Contact information: [address] [place] [address] [phone]        Thompson Grayer, MD .   Specialty: Cardiology Contact information: [place] [place] [address] [phone]        Ashok Pall, MD Follow up.   Specialty: Neurosurgery Contact information: [address] [place] [address] [phone]        [place] Follow up.   Contact information: [address] [place] [address] [phone]              Signed: Alferd Apa, [facility] 12/15/2019, 11:45 AM Please see Amion for pager number during day hours 7:00am-4:30pm

## 2019-12-15 NOTE — TOC Transition Note (Signed)
Transition of Care [facility]) - CM/SW Discharge Note   Patient Details  Name: Kevin Hart MRN: [id number] Date of Birth: [birth year]  Transition of Care [facility]) CM/SW Contact:  Ella Bodo, RN Phone Number: 12/15/2019, 3:39 PM   Clinical Narrative:  Notified by Webb Silversmith of [place] that bed is available at [facility] facility today.  Medical provider states that pt is medically stable for transfer to Hospice facility today.  Wife is aware of bed available and agreeable to transfer.  Will coordinate transport with bedside nurse.  Out of facility DNR form completed and signed by provider.    Addendum:  [facility] called for transport at 1242pm, as nurse states pt is ready for discharge.      Final next level of care: [place] Barriers to Discharge: Barriers Resolved   Patient Goals and CMS Choice   CMS Medicare.gov Compare Post Acute Care list provided to:: Patient Represenative (must comment) (wife Thayer Headings) Choice offered to / list presented to : Spouse  Discharge Placement  [facility]                     Discharge Plan and Services   Discharge Planning Services: CM Consult Post Acute Care Choice: Hospice (Residential Hospice facility)                               Social Determinants of Health (SDOH) Interventions     Readmission Risk Interventions Readmission Risk Prevention Plan 12/15/2019  Transportation Screening Complete  PCP or Specialist Appt within 5-7 Days Not Complete  Not Complete comments Pt discharging to residential hospice facility  Home Care Screening Not Complete  Home Care Screening Not Completed Comments Pt discharging to residential hospice facility  Medication Review (RN CM) Complete  Some recent data might be hidden   Reinaldo Raddle, RN, BSN  Trauma/Neuro ICU Case Manager [phone]

## 2019-12-18 LAB — CULTURE, BLOOD (ROUTINE X 2)
Culture: NO GROWTH
Culture: NO GROWTH
Special Requests: ADEQUATE

## 2019-12-22 DEATH — deceased

## 2019-12-28 ENCOUNTER — Telehealth: Payer: Self-pay

## 2019-12-28 NOTE — Telephone Encounter (Signed)
No answer/ voicemail full °

## 2020-01-01 NOTE — Progress Notes (Signed)
No ICM remote transmission received for 12/25/2019 and next ICM transmission scheduled for 01/23/2020.

## 2020-01-25 ENCOUNTER — Telehealth: Payer: Self-pay

## 2020-01-25 NOTE — Telephone Encounter (Signed)
I called about missed HF transmission 01/22/2020 but no answer/ no voicemail.

## 2020-01-29 NOTE — Progress Notes (Signed)
No ICM remote transmission received for 01/23/2020 and next ICM transmission scheduled for 02/20/2020.

## 2020-02-21 ENCOUNTER — Ambulatory Visit: Payer: PPO | Admitting: Cardiology

## 2020-02-23 ENCOUNTER — Telehealth: Payer: Self-pay

## 2020-02-23 NOTE — Telephone Encounter (Signed)
No answer/ voicemail full °

## 2020-02-24 NOTE — Progress Notes (Unsigned)
Cardiology Office Note   Date:  02/24/2020   ID:  Cayman, Kielbasa 1945/07/17, MRN [id number]  PCP:  [facility] [place]  Cardiologist:  Peter Martinique, MD EP: Thompson Grayer, MD  No chief complaint on file.     History of Present Illness: Kevin Hart is a 74 y.o. 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   He was  admitted to the hospital from 08/11/19-08/15/19 after presenting with nitro responsive chest pain c/f unstable angina. He underwent a NST which showed a high risk study with finding c/w prior MI but no ischemia. He underwent a LHC 08/14/19 which showed 30% pRCA stenosis and 30-35% ISR of previously placed p-mLAD stent. He was recommended for ongoing medical management. He was continued on aspirin, statin, BBlocker (inderal due to tremors), and entresto. He was started on zetia for improved cholesterol management. Warfarin resumed.  He was admitted in July after a fall and suffered a subdural hematoma. He had actually been off coumadin for a subdural steroid injection when he fell. Coumadin and ASA held for 10 days then resumed. He notes low BP at times - down to 80/55. Entresto dose was reduced.   The patient was admitted to [facility] 12/03/19 for falls. He reported 5 falls that day. He had associated sob, confusion and weakness. He was found to have rib fractures with pneumomediastinum treated with chest tube. Also found moderately large acute SDH with mass effect on the rt cerebellar hemisphere and multifocal intracranial hemorrhage. INR on admission was 3.5 and the patient was given Kcentra to reverse coumadin. Neurosurgery was consulted who recommended discontinuation of anticoagulation with consideration for an IVC filter. Vascular was consulted who agreed IVC filter might be the best option. Repeat head CT showed stable SDH  and slight increase in [facility]. An echo was obtained which showed LVEF 30-35%, inferoapical filling defect by definity contrast that is likely coarse trabeculation of the ventricular wall and is noted to flatten after a PVC making a thrombus less likely. He was seen by Dr Johnsie Cancel and it was recommended that coumadin be discontinued. It was felt that an IVC filter was not indicated. Patient was DC to hospice facility for comfort care      Past Medical History:  Diagnosis Date  . Arthritis   . Bladder stone 10/26/2017  . BPH (benign prostatic hyperplasia)    takes Flomax daily  . CAD (coronary artery disease)    a. prior LAD stenting;  b. 06/2014 NSTEMI/PCI: LM nl, LAD 30p, 17m ISR (3.0x20 Promus DES), LCX nl, RCA nondom, nl, EF 25-30% ant, apical, dist inf [place].  . Cataracts, bilateral    immature  . Chronic systolic CHF (congestive heart failure) ([place]) 12/31/2014  . DVT (deep venous thrombosis) ([facility])    RECURRENT left leg  . Dyslipidemia   . GERD (gastroesophageal reflux disease)   . Gout   . History of kidney stones   . Hypertension   . Ischemic cardiomyopathy 07/06/2014  . Lupus anticoagulant positive   . Myocardial infarct ([place]) 2000  . Myocardial infarct ([place]) 2016  . S/P drug eluting coronary stent placement, 07/06/14, Promus 07/07/2014  . Sleep apnea    biPaP  . Subarachnoid bleed ([place]) 2015   after falling and hitting his head    Past Surgical History:  Procedure Laterality Date  .  COLONOSCOPY    . EP IMPLANTABLE DEVICE [name] 11/27/2014   [employer] Fortify Assura VR  implanted by Dr Rayann Heman for primary prevention  . LEFT HEART CATH AND CORONARY ANGIOGRAPHY [name] 07/29/2018   Procedure: LEFT HEART CATH AND CORONARY ANGIOGRAPHY;  Surgeon: Sherren Mocha, MD;  Location: [facility];  Service: Cardiovascular;  Laterality: [name];  . LEFT HEART CATH AND CORONARY ANGIOGRAPHY [name] 08/14/2019   Procedure: LEFT HEART CATH AND CORONARY ANGIOGRAPHY;  Surgeon: Troy Sine, MD;   Location: [facility];  Service: Cardiovascular;  Laterality: [name];  . LEFT HEART CATHETERIZATION WITH CORONARY ANGIOGRAM [name] 07/06/2014   Procedure: LEFT HEART CATHETERIZATION WITH CORONARY ANGIOGRAM;  Surgeon: Peter M Martinique, MD;  Location: [facility] CATH LAB;  Service: Cardiovascular;  Laterality: [name];  . PERCUTANEOUS CORONARY STENT INTERVENTION (PCI-S) Right 07/06/2014   Procedure: PERCUTANEOUS CORONARY STENT INTERVENTION (PCI-S);  Surgeon: Peter M Martinique, MD;  Location: [facility] CATH LAB;  Service: Cardiovascular;  Laterality: Right;  . SHOULDER ARTHROSCOPY WITH SUBACROMIAL DECOMPRESSION Left 03/05/2016   Procedure: SHOULDER ARTHROSCOPY ROTATOR CUFF REPAIR WITH SUBACROMIAL DECOMPRESSION;  Surgeon: Tania Ade, MD;  Location: [facility];  Service: Orthopedics;  Laterality: Left;  SHOULDER ARTHROSCOPY ROTATOR CUFF REPAIR WITH SUBACROMIAL DECOMPRESSION  . SHOULDER CLOSED REDUCTION Left 11/29/2015   Procedure: CLOSED REDUCTION SHOULDER;  Surgeon: Tania Ade, MD;  Location: [facility];  Service: Orthopedics;  Laterality: Left;  . TRANSURETHRAL RESECTION OF PROSTATE [name] 02/24/2018   Procedure: TRANSURETHRAL RESECTION OF THE PROSTATE (TURP);  Surgeon: Irine Seal, MD;  Location: [facility];  Service: Urology;  Laterality: [name];  . WRIST SURGERY Bilateral    plates and screws      Current Outpatient Medications  Medication Sig Dispense Refill  . allopurinol (ZYLOPRIM) 300 MG tablet Take 150 mg by mouth 2 (two) times daily.     Marland Kitchen ascorbic acid (VITAMIN C) 500 MG tablet Take 500 mg by mouth daily.    . calcium-vitamin D (OSCAL WITH D) 500-200 MG-UNIT tablet Take 1 tablet by mouth.    . ezetimibe (ZETIA) 10 MG tablet Take 1 tablet (10 mg total) by mouth daily. 30 tablet 11  . furosemide (LASIX) 20 MG tablet Take 1 tablet (20 mg total) by mouth daily. 90 tablet 3  . levETIRAcetam (KEPPRA) 500 MG tablet Take 1 tablet (500 mg total) by mouth 2 (two) times daily. 12 tablet 0  . LIVALO 4 MG TABS Take 4 mg by mouth at  bedtime.     . Multiple Vitamin (ONE-A-DAY MENS PO) Take 1 tablet by mouth daily.    . nitroGLYCERIN (NITROSTAT) 0.4 MG SL tablet Place 1 tablet (0.4 mg total) under the tongue every 5 (five) minutes x 3 doses as needed for chest pain. 25 tablet 11  . pantoprazole (PROTONIX) 40 MG tablet Take 40 mg by mouth daily.    . potassium chloride SA (KLOR-CON) 20 MEQ tablet Take 20 mEq by mouth daily.    . propranolol (INDERAL) 20 MG tablet Take 20 mg by mouth 2 (two) times daily.    . sacubitril-valsartan (ENTRESTO) 24-26 MG Take 1 tablet by mouth daily. 60 tablet 3   No current facility-administered medications for this visit.    Allergies:   Patient has no known allergies.    Social History:  The patient  reports that he has never smoked. He has never used smokeless tobacco. He reports current alcohol use. He reports that he does not use drugs.   Family History:  The  patient's family history includes Aneurysm in his father.    ROS:  Please see the history of present illness.   Otherwise, review of systems are positive for none.   All other systems are reviewed and negative.    PHYSICAL EXAM: VS:  There were no vitals taken for this visit. , BMI There is no height or weight on file to calculate BMI. GEN: Well nourished, well developed, in no acute distress HEENT: sclera anicteric Neck: no JVD, carotid bruits, or masses Cardiac: RRR; no murmurs, rubs, or gallops, no edema  Respiratory:  clear to auscultation bilaterally, normal work of breathing GI: soft, nontender, nondistended, + BS MS: no deformity or atrophy Skin: warm and dry, no rash Neuro:  Strength and sensation are intact Psych: euthymic mood, full affect   EKG:  EKG is not ordered today.    Recent Labs: 08/11/2019: TSH 1.081 12/03/2019: ALT 12 12/13/2019: BUN 17; Creatinine, Ser 1.11; Hemoglobin 13.3; Magnesium 2.1; Platelets 210; Potassium 4.1; Sodium 139    Lipid Panel    Component Value Date/Time   CHOL 169  08/12/2019 0423   CHOL 133 09/14/2018 0837   TRIG 118 08/12/2019 0423   HDL 39 (L) 08/12/2019 0423   HDL 49 09/14/2018 0837   CHOLHDL 4.3 08/12/2019 0423   VLDL 24 08/12/2019 0423   LDLCALC 106 (H) 08/12/2019 0423   LDLCALC 59 09/14/2018 0837    Labs reviewed from 11/09/19: INR 1.7. UA normal. Cholesterol 146, triglycerides 146, HDL 47, LDL 70. Ferritin normal. Covid test negative. CBC normal. Glucose 114. Otherwise CMET normal. Vit D 28.38. FSH, folate, TSH LH testosterone all normal.   Wt Readings from Last 3 Encounters:  12/03/19 205 lb (93 kg)  11/29/19 209 lb 12.8 oz (95.2 kg)  10/25/19 215 lb (97.5 kg)      Other studies Reviewed: Additional studies/ records that were reviewed today include:   LEFT HEART CATH AND CORONARY ANGIOGRAPHY 08/14/19  Conclusion    Prox RCA lesion is 30% stenosed.  Prox LAD to Mid LAD lesion is 35% stenosed.  The Left main is a normal vessel and trifurcates into a large LAD, ramus immediate vessel and dominant left circumflex coronary artery. The LAD stent remains patent and has residual in-stent narrowing of 30 to 35% not significantly changed from the prior catheterization. The ramus intermediate and dominant circumflex are angiographically normal. The RCA is a small nondominant vessel with smooth 30% proximal stenosis.  LVEDP 5 mmHg.  RECOMMENDATION: Medical therapy. Reinitiate warfarin therapy. GDMT therapy for his reduced LV function.   Diagnostic Dominance: Left  Intervention    Echo 08/12/19 1. Since the last study on 08/18/2017 LVEf has further decreased from  35-40% to 25-30% with diffuse hypokinesis and apical aneurysmal  dilatation.  2. Left ventricular ejection fraction, by estimation, is 25 to 30%. The  left ventricle has severely decreased function. The left ventricle has no  regional wall motion abnormalities. The left ventricular internal cavity  size was mildly dilated. There is  moderate concentric left  ventricular hypertrophy. Left ventricular  diastolic parameters are consistent with Grade I diastolic dysfunction  (impaired relaxation). Elevated left atrial pressure.  3. Right ventricular systolic function is normal. The right ventricular  size is normal. A ICD wire is visualized. There is normal pulmonary artery  systolic pressure. The estimated right ventricular systolic pressure is  30.8 mmHg.  4. The mitral valve is normal in structure. Mild mitral valve  regurgitation. No evidence of mitral stenosis.  5.  The aortic valve is normal in structure. Aortic valve regurgitation is  mild. Mild to moderate aortic valve sclerosis/calcification is present,  without any evidence of aortic stenosis.  6. The inferior vena cava is normal in size with greater than 50%  respiratory variability, suggesting right atrial pressure of 3 mmHg.     Echo 12/04/19 1. There is an inferoapical filling defect by Definity contrast that is  likely coarse trabeculation of the ventricular wall and is noted to  flatten after a PVC, making a thrombus less likely. Left ventricular  ejection fraction, by estimation, is 30 to  35%. The left ventricle has moderately decreased function. The left  ventricle demonstrates regional wall motion abnormalities (see scoring  diagram/findings for description). There is mild left ventricular  hypertrophy. Left ventricular diastolic  parameters are consistent with Grade I diastolic dysfunction (impaired  relaxation). There is severe akinesis of the left ventricular, entire  anterior wall, anteroseptal wall, anterior segment and inferoapical  segment. The apex appears aneurysmal.  2. An AICD is visualized.  3. The mitral valve is abnormal. Trivial mitral valve regurgitation.  4. The aortic valve is tricuspid. Aortic valve regurgitation is mild.  Mild aortic valve sclerosis is present, with no evidence of aortic valve  stenosis. Aortic valve mean gradient measures 3.0  mmHg.   ASSESSMENT AND PLAN:  1. CAD s/p remote PCI to LAD: no anginal complaints - Cardiac cath in May 2021 showed nonobstructive disease - Continue aspirin, statin, and zetia - Continue low dose BBlocker  2. Chronic combined CHF: EF 25-30% in May, down from 35-40% previously. No volume overload - having periods of symptomatic hypotension - Continue low dose inderal and lasix - will reduce Entresto to once a day. If Hypotension persists will need to discontinue given high fall risk - s/p ICD  3. HTN: now with low BP readings.   4. HLD:  - Continue zetia and livalo  5. Fall with SDH.       Disposition:   FU with Dr. Martinique in 3 months  Signed, Peter Martinique, MD  02/24/2020 8:16 AM

## 2020-02-26 NOTE — Progress Notes (Signed)
No ICM remote transmission received for 02/20/2020 and next ICM transmission scheduled for 03/19/2020.

## 2020-02-29 ENCOUNTER — Ambulatory Visit: Payer: PPO | Admitting: Cardiology

## 2020-04-05 NOTE — Progress Notes (Signed)
No ICM remote transmission received for 03/28/2020 and next ICM transmission scheduled for 05/13/2020.

## 2020-08-23 ENCOUNTER — Other Ambulatory Visit: Payer: Self-pay

## 2020-08-23 DIAGNOSIS — I714 Abdominal aortic aneurysm, without rupture, unspecified: Secondary | ICD-10-CM

## 2020-11-03 IMAGING — DX DG CHEST 1V PORT
1 series · 1 of 1 positions shown · non-contrast
Comparison: Yesterday

CLINICAL DATA: Pneumothorax.

EXAM:
PORTABLE CHEST 1 VIEW

[chest]
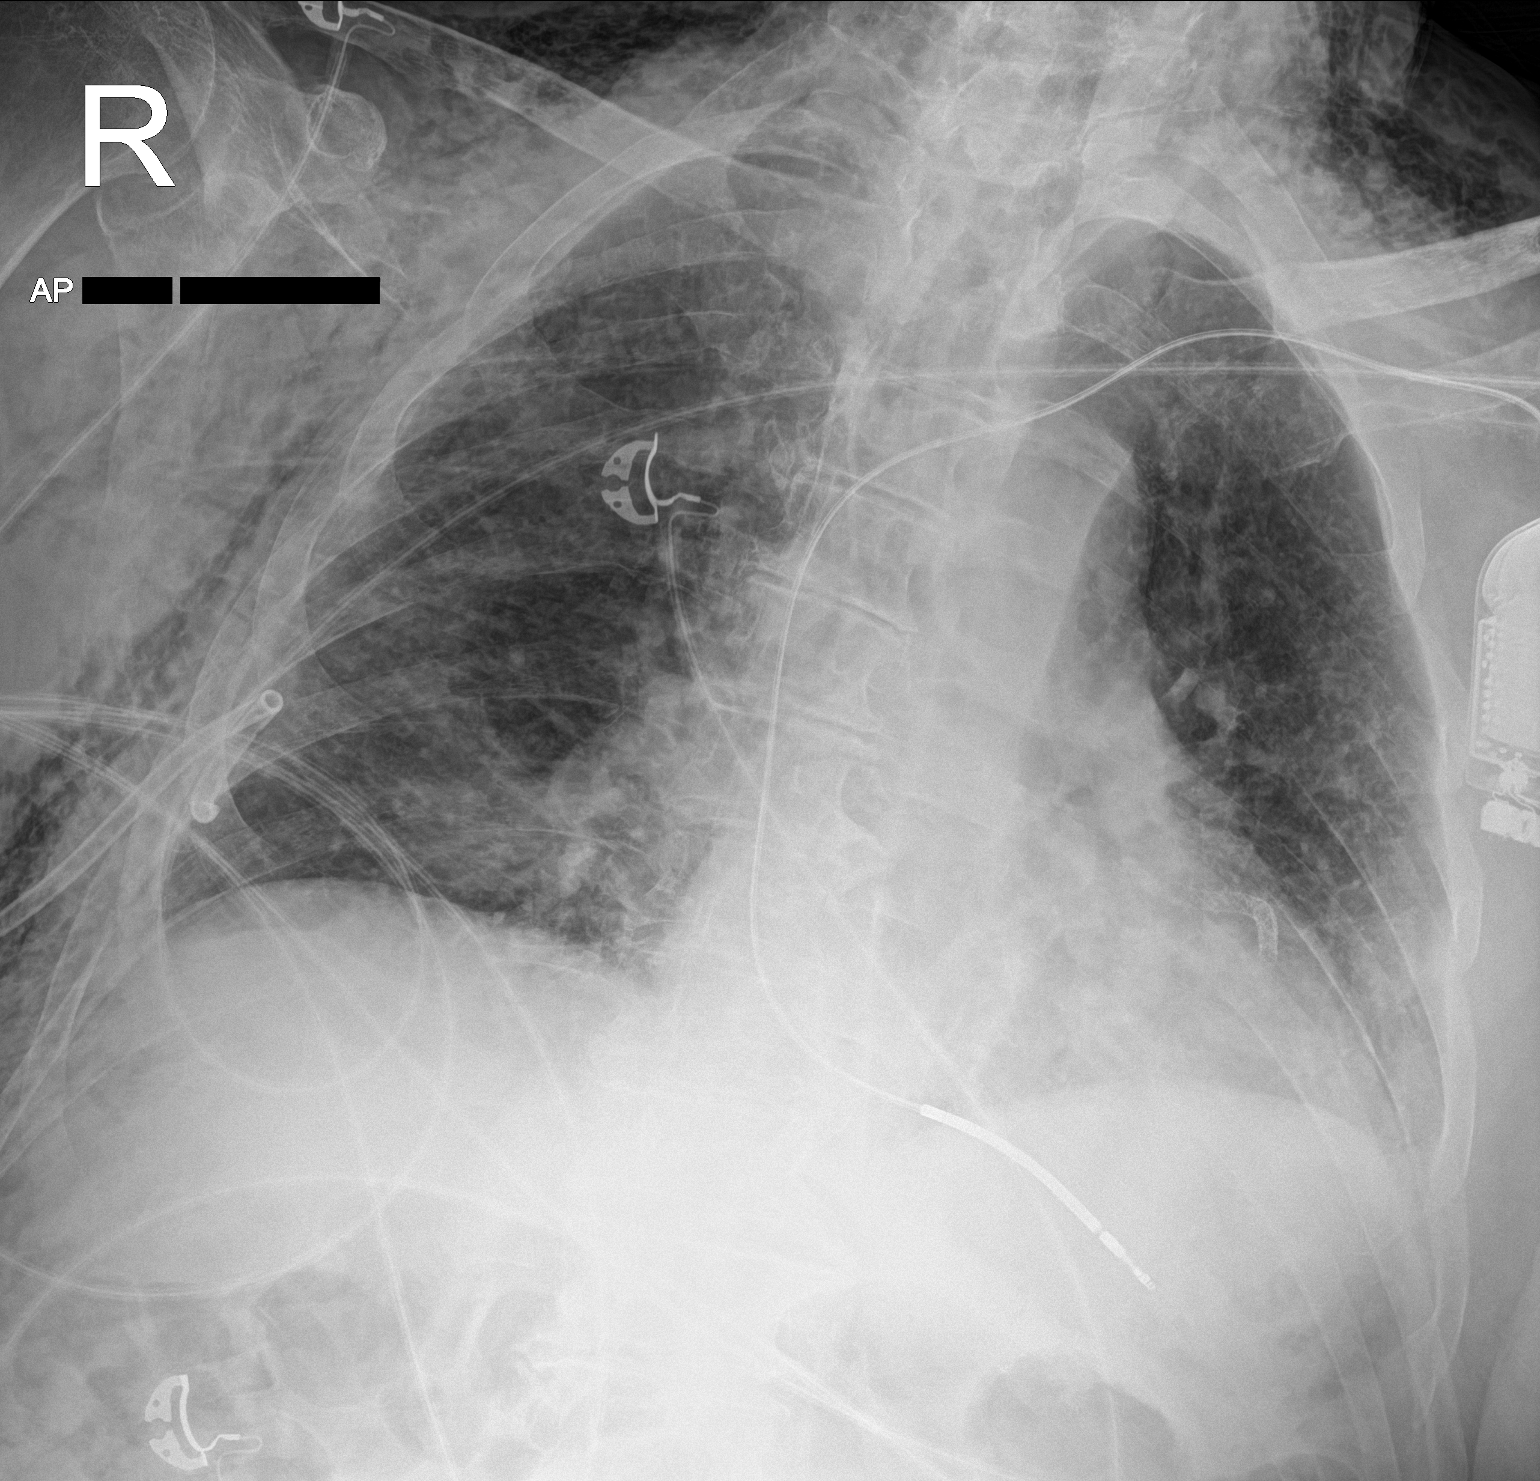

[1 of 1 positions shown; findings below may reference images not displayed]

FINDINGS: Right-sided chest tube in place. No visible pneumothorax. Extensive
chest wall emphysema that is not clearly changed when accounting for
differences in coverage.

Cardiomegaly and pulmonary infiltrates. Coronary stenting.
Defibrillator lead from the left in stable position.
IMPRESSION: No visible pneumothorax.

## 2020-11-03 IMAGING — CT CT HEAD W/O CM
4 series · 14 of 47 positions shown, 16 images · non-contrast
Comparison: Most recent CT 12/04/2019

CLINICAL DATA: Fall 5 days prior to admission multiple falls

EXAM:
CT HEAD WITHOUT CONTRAST
TECHNIQUE: Contiguous axial images were obtained from the base of the skull
through the vertex without intravenous contrast.

[Series 3: head (person_name) · axial · 0.37mm/px · z∈[-72,+51]mm · 6 of 39 slices shown, 8 images]
[im 6/39  brain]
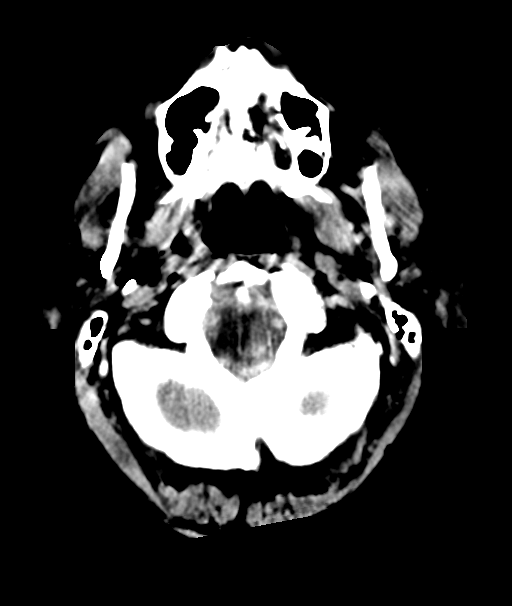
[im 6/39  bone]
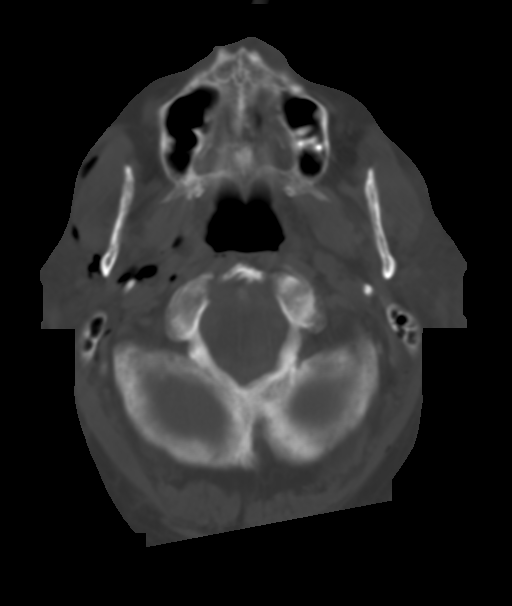
[im 11/39  brain]
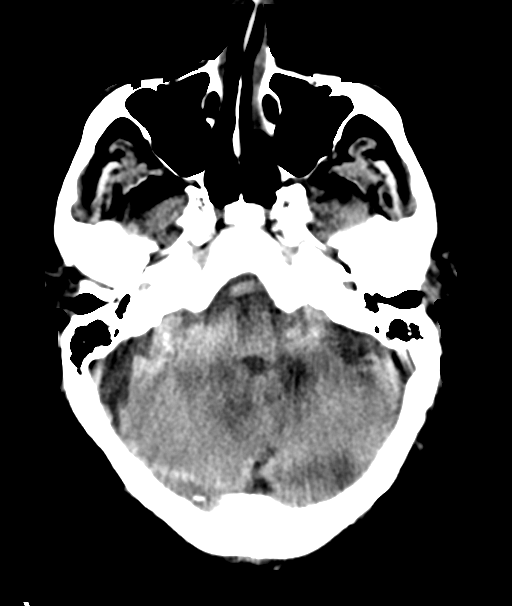
[im 17/39  brain]
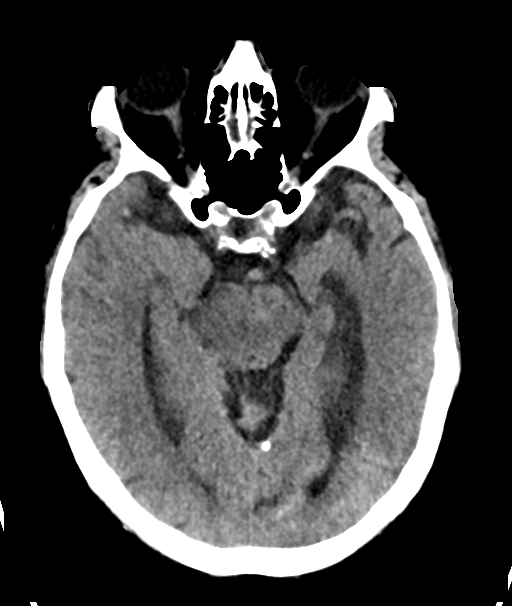
[im 22/39  brain]
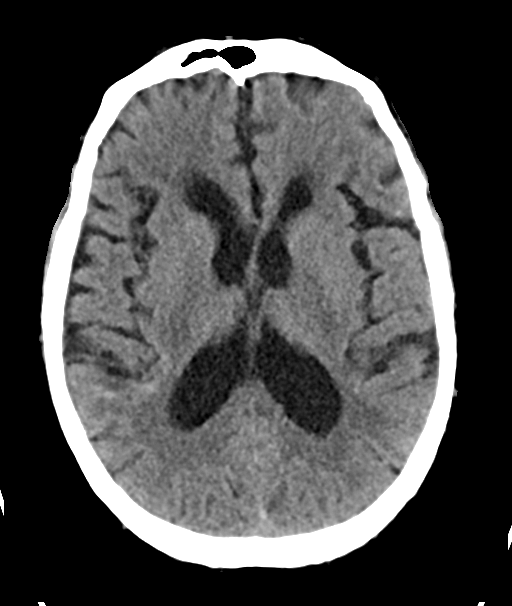
[im 28/39  brain]
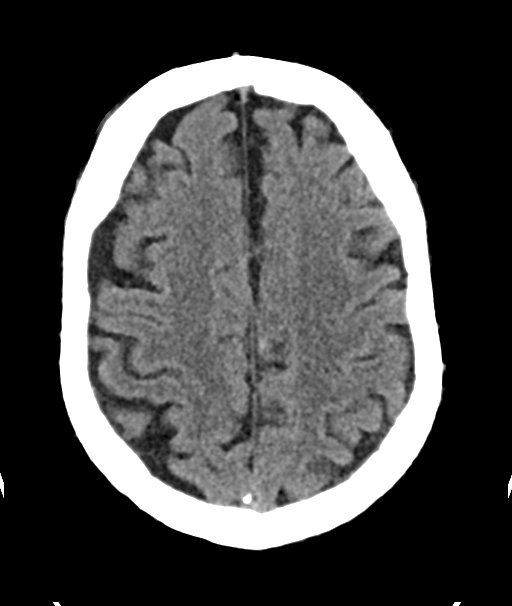
[im 28/39  bone]
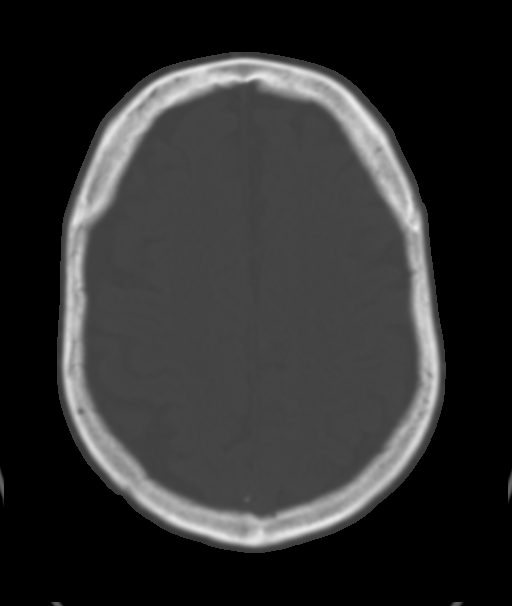
[im 33/39  brain]
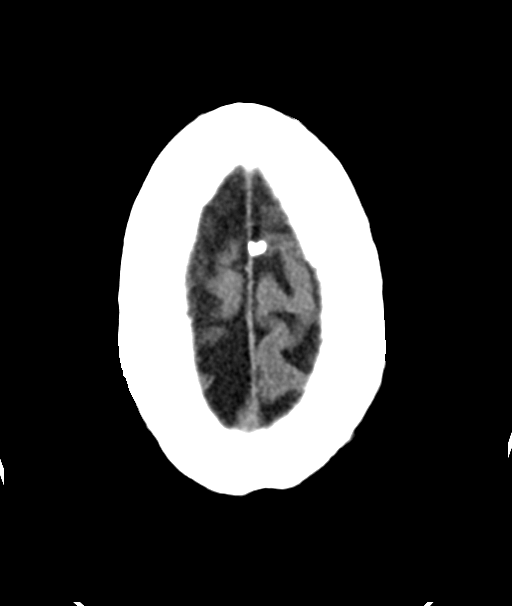

[Series 4: head bone · axial · 0.49mm/px · z∈[-138,-120]mm · 2 of 100 slices shown]
[im 10/100  bone]
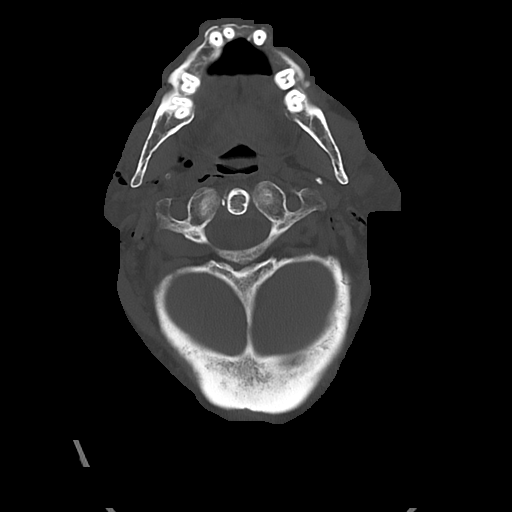
[im 19/100  bone]
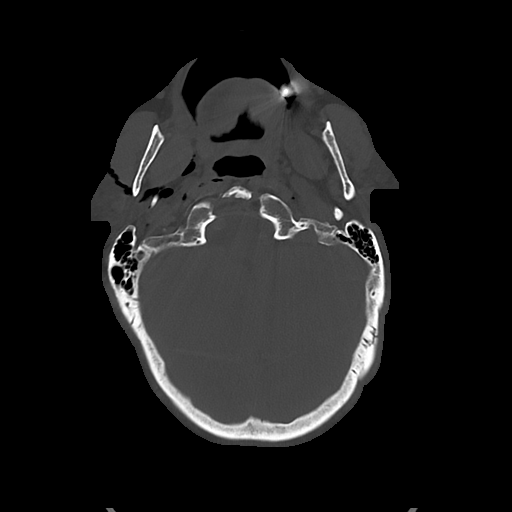

[Series 5: cor soft (person_name) · coronal · 0.36mm/px · 3 of 78 slices shown]
[im 30/78  brain]
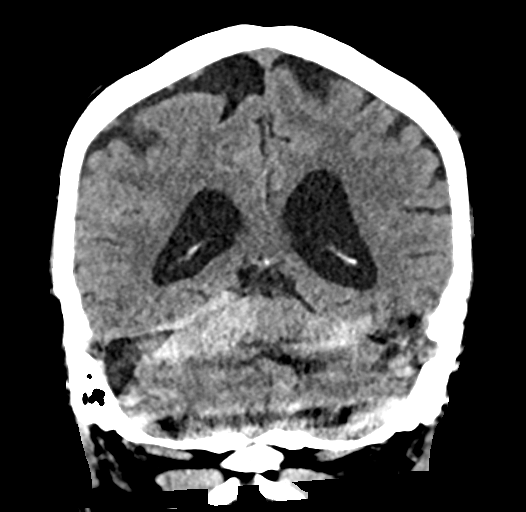
[im 36/78  brain]
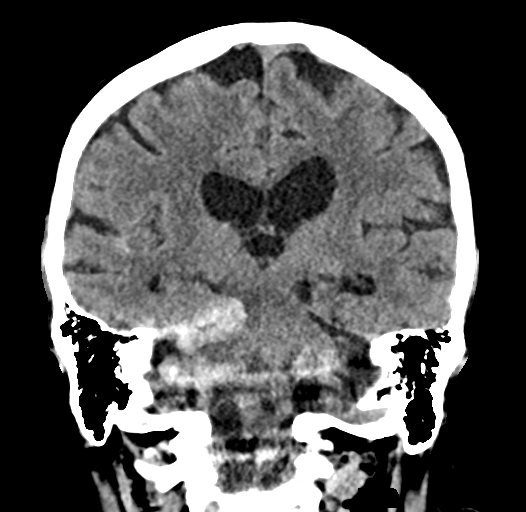
[im 42/78  brain]
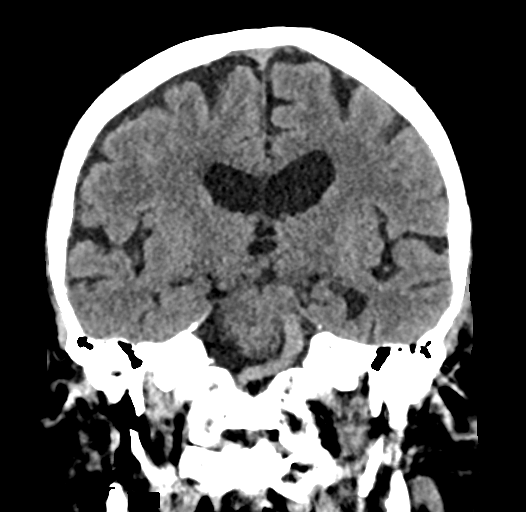

[Series 6: sag soft (person_name) · sagittal · 0.37mm/px · 3 of 64 slices shown]
[im 22/64  brain]
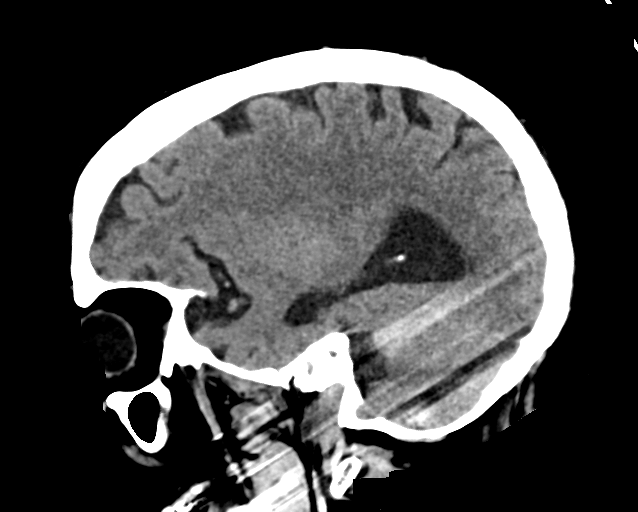
[im 32/64  brain]
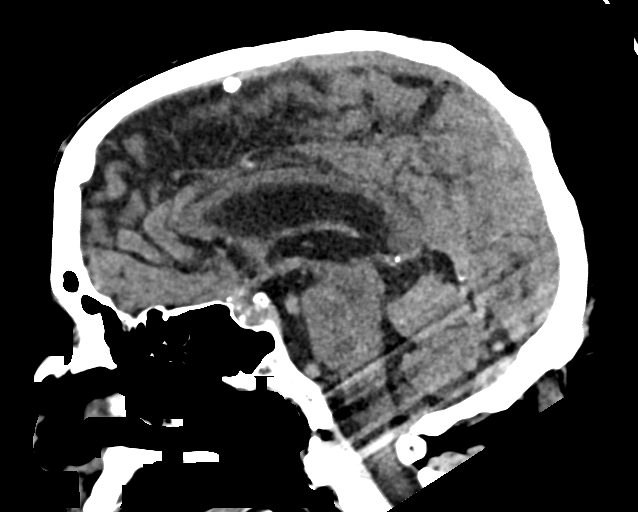
[im 43/64  brain]
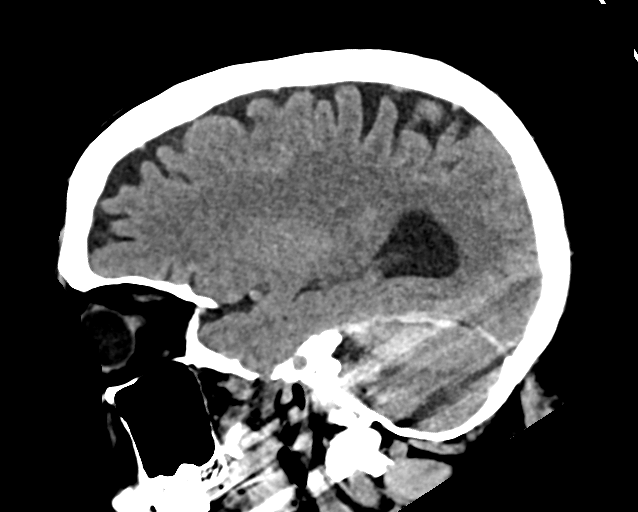

[14 of 47 positions shown; findings below may reference images not displayed]

FINDINGS: Brain: Redemonstration of the heterogeneous, hyperdense extra-axial
hemorrhage seen along the right tentorial leaflet with associated
mass effect on the adjacent right cerebellum and into a lesser
extent pons including some partial effacement of the fourth
ventricle. Size measures approximately 3 x 1.8 x 3.2 cm, which is
grossly similar to comparison accounting for differences in patient
positioning and slight redistribution. Additional blood layers along
the right tentorium. Scattered foci of additional subarachnoid
hemorrhage is present along the right cerebellar hemisphere, right
parietooccipital and temporal lobes, and possibly within several of
the sulci of the left occipital and parietal lobes as well. No new
large parenchymal hemorrhage is identified. No evidence of acute
infarction or developing hydrocephalus. Symmetric prominence of the
ventricles, cisterns and sulci compatible with parenchymal volume
loss and ex vacuo dilatation. Patchy areas of white matter
hypoattenuation are most compatible with chronic microvascular
angiopathy.

Vascular: Atherosclerotic calcification of the carotid siphons and
intradural vertebral arteries. No hyperdense vessel.

Skull: No calvarial fracture or suspicious osseous lesion. No scalp
swelling or hematoma. Mild soft tissue thickening along the left
frontotemporal scalp. Some additional contusive changes of the left
occipital scalp as well. No subjacent calvarial or visible skull
base fractures. Some age indeterminate deformities of the nasal
bones.

Sinuses/Orbits: Paranasal sinuses and mastoid air cells are
predominantly clear. Orbital structures are unremarkable aside from
prior lens extractions.

Other: Extensive soft tissue emphysema within the superficial and
deep spaces of the included portions of the neck.
IMPRESSION: 1. Redemonstration of the heterogeneous, hyperdense extra-axial
hemorrhage along the right tentorial leaflet with associated mass
effect on the adjacent right cerebellum and into a lesser extent
pons including some partial effacement of the fourth ventricle. Size
measures approximately 3 x 1.8 x 3.2 cm, which is grossly similar to
comparison accounting for differences in patient positioning and
slight redistribution.
2. Scattered foci of additional subarachnoid hemorrhage along the
right cerebellar hemisphere, right parietooccipital and temporal
lobes, and possibly within several of the sulci of the left
occipital and parietal lobes as well.
3. No new large parenchymal hemorrhage is identified. No evidence of
acute infarction or developing hydrocephalus.
4. Extensive soft tissue emphysema within the superficial and deep
spaces of the included portions of the neck.
5. No Calvarial or visible skull base fractures.
6. Several sites of scalp swelling/infiltration including the left
temporal and occipital scalp.
7. Some age indeterminate deformities of the nasal bones. Correlate
for point tenderness.

## 2020-11-04 IMAGING — DX DG CHEST 1V PORT
1 series · 1 of 1 positions shown · non-contrast
Comparison: 12/05/2019

CLINICAL DATA: Follow-up pneumothorax

EXAM:
PORTABLE CHEST 1 VIEW

[chest]
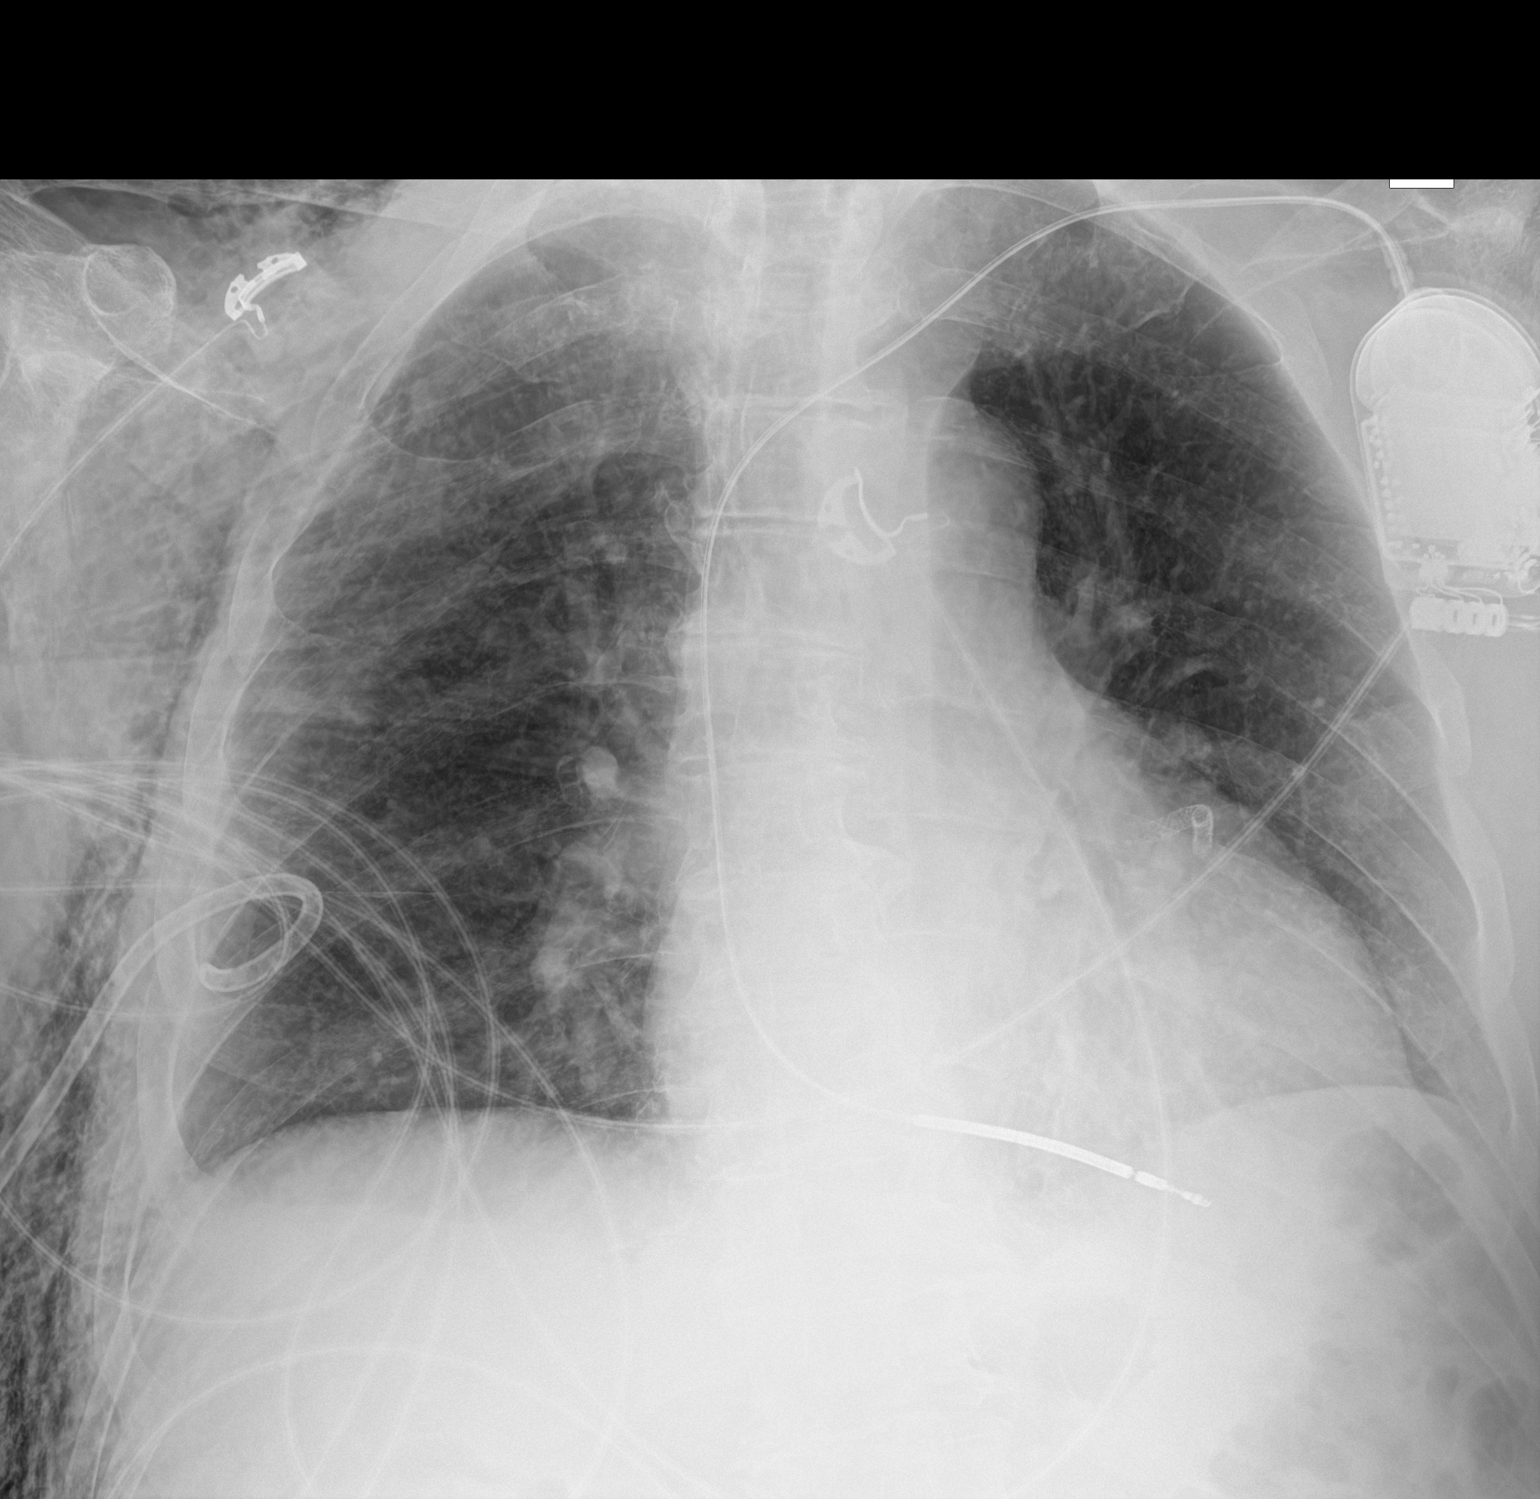

[1 of 1 positions shown; findings below may reference images not displayed]

FINDINGS: Pigtail catheter is again noted on the right. No definitive
pneumothorax is seen. Persistent subcutaneous emphysema is seen.
Cardiac shadow is stable. Defibrillator is again noted. No focal
infiltrate is seen.
IMPRESSION: No evidence of pneumothorax.

## 2021-06-27 NOTE — Research (Signed)
BEAT HF  Informed Consent   Subject Name: Kevin Hart  Subject met inclusion and exclusion criteria.  The informed consent form, study requirements and expectations were reviewed with the subject and questions and concerns were addressed prior to the signing of the consent form.  The subject verbalized understanding of the trial requirements.  The subject agreed to participate in the Beat HF  trial and signed the informed consent on 08/18/2017.  The informed consent was obtained prior to performance of any protocol-specific procedures for the subject.  A copy of the signed informed consent was given to the subject and a copy was placed in the subject's medical record.   Mercer Pod D
